# Patient Record
Sex: Female | Born: 1945 | Race: Black or African American | Hispanic: No | Marital: Married | Smoking: Former smoker
Health system: Southern US, Community
[De-identification: ages and names within clinical notes are randomized; demographics above are authoritative.]

## PROBLEM LIST (undated history)

## (undated) DIAGNOSIS — E119 Type 2 diabetes mellitus without complications: Secondary | ICD-10-CM

## (undated) DIAGNOSIS — J449 Chronic obstructive pulmonary disease, unspecified: Secondary | ICD-10-CM

## (undated) DIAGNOSIS — K219 Gastro-esophageal reflux disease without esophagitis: Secondary | ICD-10-CM

## (undated) DIAGNOSIS — R0602 Shortness of breath: Secondary | ICD-10-CM

## (undated) DIAGNOSIS — Z923 Personal history of irradiation: Secondary | ICD-10-CM

## (undated) DIAGNOSIS — E785 Hyperlipidemia, unspecified: Secondary | ICD-10-CM

## (undated) DIAGNOSIS — R042 Hemoptysis: Secondary | ICD-10-CM

## (undated) DIAGNOSIS — I10 Essential (primary) hypertension: Secondary | ICD-10-CM

## (undated) DIAGNOSIS — R05 Cough: Secondary | ICD-10-CM

## (undated) DIAGNOSIS — D72829 Elevated white blood cell count, unspecified: Secondary | ICD-10-CM

## (undated) DIAGNOSIS — C641 Malignant neoplasm of right kidney, except renal pelvis: Secondary | ICD-10-CM

## (undated) DIAGNOSIS — M1990 Unspecified osteoarthritis, unspecified site: Secondary | ICD-10-CM

## (undated) DIAGNOSIS — R55 Syncope and collapse: Secondary | ICD-10-CM

## (undated) DIAGNOSIS — Z7189 Other specified counseling: Secondary | ICD-10-CM

## (undated) DIAGNOSIS — M8580 Other specified disorders of bone density and structure, unspecified site: Secondary | ICD-10-CM

## (undated) DIAGNOSIS — C801 Malignant (primary) neoplasm, unspecified: Secondary | ICD-10-CM

## (undated) DIAGNOSIS — D649 Anemia, unspecified: Secondary | ICD-10-CM

## (undated) DIAGNOSIS — I499 Cardiac arrhythmia, unspecified: Secondary | ICD-10-CM

## (undated) DIAGNOSIS — C3490 Malignant neoplasm of unspecified part of unspecified bronchus or lung: Secondary | ICD-10-CM

## (undated) DIAGNOSIS — R5382 Chronic fatigue, unspecified: Secondary | ICD-10-CM

## (undated) DIAGNOSIS — N182 Chronic kidney disease, stage 2 (mild): Secondary | ICD-10-CM

## (undated) DIAGNOSIS — I82402 Acute embolism and thrombosis of unspecified deep veins of left lower extremity: Secondary | ICD-10-CM

## (undated) DIAGNOSIS — R251 Tremor, unspecified: Secondary | ICD-10-CM

## (undated) DIAGNOSIS — F419 Anxiety disorder, unspecified: Secondary | ICD-10-CM

## (undated) HISTORY — DX: Chronic fatigue, unspecified: R53.82

## (undated) HISTORY — DX: Chronic obstructive pulmonary disease, unspecified: J44.9

## (undated) HISTORY — DX: Type 2 diabetes mellitus without complications: E11.9

## (undated) HISTORY — DX: Hyperlipidemia, unspecified: E78.5

## (undated) HISTORY — DX: Anxiety disorder, unspecified: F41.9

## (undated) HISTORY — DX: Malignant neoplasm of unspecified part of unspecified bronchus or lung: C34.90

## (undated) HISTORY — DX: Chronic kidney disease, stage 2 (mild): N18.2

## (undated) HISTORY — DX: Tremor, unspecified: R25.1

## (undated) HISTORY — PX: EYE SURGERY: SHX253

## (undated) HISTORY — DX: Syncope and collapse: R55

## (undated) HISTORY — DX: Elevated white blood cell count, unspecified: D72.829

## (undated) HISTORY — DX: Other specified disorders of bone density and structure, unspecified site: M85.80

## (undated) HISTORY — DX: Acute embolism and thrombosis of unspecified deep veins of left lower extremity: I82.402

## (undated) HISTORY — PX: OTHER SURGICAL HISTORY: SHX169

## (undated) HISTORY — DX: Other specified counseling: Z71.89

## (undated) HISTORY — DX: Anemia, unspecified: D64.9

## (undated) HISTORY — DX: Malignant neoplasm of right kidney, except renal pelvis: C64.1

## (undated) HISTORY — PX: BACK SURGERY: SHX140

## (undated) HISTORY — DX: Gastro-esophageal reflux disease without esophagitis: K21.9

---

## 1998-02-02 ENCOUNTER — Other Ambulatory Visit: Admission: RE | Admit: 1998-02-02 | Discharge: 1998-02-02 | Payer: Self-pay | Admitting: Internal Medicine

## 1999-09-17 ENCOUNTER — Emergency Department (HOSPITAL_COMMUNITY): Admission: EM | Admit: 1999-09-17 | Discharge: 1999-09-17 | Payer: Self-pay | Admitting: Emergency Medicine

## 1999-09-23 ENCOUNTER — Encounter: Payer: Self-pay | Admitting: Internal Medicine

## 1999-09-23 ENCOUNTER — Encounter: Admission: RE | Admit: 1999-09-23 | Discharge: 1999-09-23 | Payer: Self-pay | Admitting: Internal Medicine

## 1999-10-05 ENCOUNTER — Encounter: Payer: Self-pay | Admitting: Vascular Surgery

## 1999-10-07 ENCOUNTER — Ambulatory Visit (HOSPITAL_COMMUNITY): Admission: RE | Admit: 1999-10-07 | Discharge: 1999-10-07 | Payer: Self-pay | Admitting: Vascular Surgery

## 1999-10-07 ENCOUNTER — Encounter: Payer: Self-pay | Admitting: Vascular Surgery

## 2000-08-28 ENCOUNTER — Other Ambulatory Visit: Admission: RE | Admit: 2000-08-28 | Discharge: 2000-08-28 | Payer: Self-pay | Admitting: Internal Medicine

## 2000-09-24 ENCOUNTER — Encounter: Admission: RE | Admit: 2000-09-24 | Discharge: 2000-09-24 | Payer: Self-pay | Admitting: Internal Medicine

## 2000-09-24 ENCOUNTER — Encounter: Payer: Self-pay | Admitting: Internal Medicine

## 2001-09-25 ENCOUNTER — Encounter: Payer: Self-pay | Admitting: Internal Medicine

## 2001-09-25 ENCOUNTER — Encounter: Admission: RE | Admit: 2001-09-25 | Discharge: 2001-09-25 | Payer: Self-pay | Admitting: Internal Medicine

## 2001-12-14 ENCOUNTER — Emergency Department (HOSPITAL_COMMUNITY): Admission: EM | Admit: 2001-12-14 | Discharge: 2001-12-14 | Payer: Self-pay | Admitting: Emergency Medicine

## 2002-07-28 ENCOUNTER — Other Ambulatory Visit: Admission: RE | Admit: 2002-07-28 | Discharge: 2002-07-28 | Payer: Self-pay

## 2002-09-29 ENCOUNTER — Encounter: Admission: RE | Admit: 2002-09-29 | Discharge: 2002-09-29 | Payer: Self-pay | Admitting: Internal Medicine

## 2002-09-29 ENCOUNTER — Encounter: Payer: Self-pay | Admitting: Internal Medicine

## 2003-02-10 ENCOUNTER — Other Ambulatory Visit: Admission: RE | Admit: 2003-02-10 | Discharge: 2003-02-10 | Payer: Self-pay | Admitting: Obstetrics and Gynecology

## 2003-03-24 ENCOUNTER — Encounter (INDEPENDENT_AMBULATORY_CARE_PROVIDER_SITE_OTHER): Payer: Self-pay | Admitting: Specialist

## 2003-03-24 ENCOUNTER — Ambulatory Visit (HOSPITAL_COMMUNITY): Admission: RE | Admit: 2003-03-24 | Discharge: 2003-03-24 | Payer: Self-pay | Admitting: Obstetrics and Gynecology

## 2003-10-06 ENCOUNTER — Encounter: Admission: RE | Admit: 2003-10-06 | Discharge: 2003-10-06 | Payer: Self-pay | Admitting: Internal Medicine

## 2004-05-12 ENCOUNTER — Inpatient Hospital Stay (HOSPITAL_COMMUNITY): Admission: EM | Admit: 2004-05-12 | Discharge: 2004-05-17 | Payer: Self-pay | Admitting: Emergency Medicine

## 2004-05-24 ENCOUNTER — Ambulatory Visit (HOSPITAL_COMMUNITY): Admission: RE | Admit: 2004-05-24 | Discharge: 2004-05-24 | Payer: Self-pay | Admitting: Gastroenterology

## 2004-07-04 ENCOUNTER — Ambulatory Visit (HOSPITAL_COMMUNITY): Admission: RE | Admit: 2004-07-04 | Discharge: 2004-07-04 | Payer: Self-pay | Admitting: Neonatology

## 2004-09-01 ENCOUNTER — Other Ambulatory Visit: Admission: RE | Admit: 2004-09-01 | Discharge: 2004-09-01 | Payer: Self-pay | Admitting: Obstetrics and Gynecology

## 2004-10-06 ENCOUNTER — Encounter: Admission: RE | Admit: 2004-10-06 | Discharge: 2004-10-06 | Payer: Self-pay | Admitting: Internal Medicine

## 2004-11-09 ENCOUNTER — Encounter: Admission: RE | Admit: 2004-11-09 | Discharge: 2004-11-09 | Payer: Self-pay | Admitting: Internal Medicine

## 2005-05-18 ENCOUNTER — Emergency Department (HOSPITAL_COMMUNITY): Admission: EM | Admit: 2005-05-18 | Discharge: 2005-05-18 | Payer: Self-pay | Admitting: Emergency Medicine

## 2005-06-07 ENCOUNTER — Inpatient Hospital Stay (HOSPITAL_COMMUNITY): Admission: EM | Admit: 2005-06-07 | Discharge: 2005-06-13 | Payer: Self-pay | Admitting: Emergency Medicine

## 2005-07-13 ENCOUNTER — Other Ambulatory Visit: Admission: RE | Admit: 2005-07-13 | Discharge: 2005-07-13 | Payer: Self-pay | Admitting: Internal Medicine

## 2005-10-18 ENCOUNTER — Encounter: Admission: RE | Admit: 2005-10-18 | Discharge: 2005-10-18 | Payer: Self-pay | Admitting: Internal Medicine

## 2005-12-26 ENCOUNTER — Other Ambulatory Visit: Admission: RE | Admit: 2005-12-26 | Discharge: 2005-12-26 | Payer: Self-pay | Admitting: Obstetrics and Gynecology

## 2006-05-29 ENCOUNTER — Encounter: Admission: RE | Admit: 2006-05-29 | Discharge: 2006-05-29 | Payer: Self-pay | Admitting: Internal Medicine

## 2006-07-04 ENCOUNTER — Ambulatory Visit (HOSPITAL_COMMUNITY): Admission: RE | Admit: 2006-07-04 | Discharge: 2006-07-04 | Payer: Self-pay | Admitting: Obstetrics and Gynecology

## 2006-07-04 ENCOUNTER — Encounter (INDEPENDENT_AMBULATORY_CARE_PROVIDER_SITE_OTHER): Payer: Self-pay | Admitting: *Deleted

## 2006-07-28 ENCOUNTER — Emergency Department (HOSPITAL_COMMUNITY): Admission: EM | Admit: 2006-07-28 | Discharge: 2006-07-28 | Payer: Self-pay | Admitting: Emergency Medicine

## 2006-09-18 ENCOUNTER — Inpatient Hospital Stay (HOSPITAL_COMMUNITY): Admission: EM | Admit: 2006-09-18 | Discharge: 2006-09-20 | Payer: Self-pay | Admitting: Emergency Medicine

## 2006-10-23 ENCOUNTER — Encounter: Admission: RE | Admit: 2006-10-23 | Discharge: 2006-10-23 | Payer: Self-pay | Admitting: Internal Medicine

## 2007-01-06 ENCOUNTER — Emergency Department (HOSPITAL_COMMUNITY): Admission: EM | Admit: 2007-01-06 | Discharge: 2007-01-06 | Payer: Self-pay | Admitting: *Deleted

## 2007-01-10 ENCOUNTER — Other Ambulatory Visit: Admission: RE | Admit: 2007-01-10 | Discharge: 2007-01-10 | Payer: Self-pay | Admitting: Obstetrics and Gynecology

## 2007-02-12 ENCOUNTER — Ambulatory Visit (HOSPITAL_COMMUNITY): Admission: RE | Admit: 2007-02-12 | Discharge: 2007-02-12 | Payer: Self-pay | Admitting: Ophthalmology

## 2007-03-04 ENCOUNTER — Encounter: Admission: RE | Admit: 2007-03-04 | Discharge: 2007-03-04 | Payer: Self-pay | Admitting: Obstetrics and Gynecology

## 2007-04-15 ENCOUNTER — Inpatient Hospital Stay (HOSPITAL_COMMUNITY): Admission: EM | Admit: 2007-04-15 | Discharge: 2007-04-18 | Payer: Self-pay | Admitting: Emergency Medicine

## 2007-05-18 ENCOUNTER — Emergency Department (HOSPITAL_COMMUNITY): Admission: EM | Admit: 2007-05-18 | Discharge: 2007-05-18 | Payer: Self-pay | Admitting: Emergency Medicine

## 2007-07-26 ENCOUNTER — Emergency Department (HOSPITAL_COMMUNITY): Admission: EM | Admit: 2007-07-26 | Discharge: 2007-07-26 | Payer: Self-pay | Admitting: Family Medicine

## 2007-08-07 ENCOUNTER — Ambulatory Visit (HOSPITAL_COMMUNITY): Admission: RE | Admit: 2007-08-07 | Discharge: 2007-08-07 | Payer: Self-pay | Admitting: Ophthalmology

## 2007-09-12 ENCOUNTER — Ambulatory Visit (HOSPITAL_COMMUNITY): Admission: RE | Admit: 2007-09-12 | Discharge: 2007-09-12 | Payer: Self-pay | Admitting: Ophthalmology

## 2007-10-24 ENCOUNTER — Encounter: Admission: RE | Admit: 2007-10-24 | Discharge: 2007-10-24 | Payer: Self-pay | Admitting: Obstetrics and Gynecology

## 2007-11-01 ENCOUNTER — Encounter: Admission: RE | Admit: 2007-11-01 | Discharge: 2007-11-01 | Payer: Self-pay | Admitting: Obstetrics and Gynecology

## 2007-11-04 ENCOUNTER — Ambulatory Visit (HOSPITAL_COMMUNITY): Admission: RE | Admit: 2007-11-04 | Discharge: 2007-11-04 | Payer: Self-pay | Admitting: Ophthalmology

## 2008-08-13 ENCOUNTER — Observation Stay (HOSPITAL_COMMUNITY): Admission: EM | Admit: 2008-08-13 | Discharge: 2008-08-14 | Payer: Self-pay | Admitting: Emergency Medicine

## 2008-10-04 ENCOUNTER — Emergency Department (HOSPITAL_COMMUNITY): Admission: EM | Admit: 2008-10-04 | Discharge: 2008-10-04 | Payer: Self-pay | Admitting: Emergency Medicine

## 2008-10-27 ENCOUNTER — Encounter: Admission: RE | Admit: 2008-10-27 | Discharge: 2008-10-27 | Payer: Self-pay | Admitting: Internal Medicine

## 2008-11-11 ENCOUNTER — Encounter: Admission: RE | Admit: 2008-11-11 | Discharge: 2008-11-11 | Payer: Self-pay | Admitting: Obstetrics and Gynecology

## 2008-11-13 ENCOUNTER — Ambulatory Visit (HOSPITAL_COMMUNITY): Admission: RE | Admit: 2008-11-13 | Discharge: 2008-11-13 | Payer: Self-pay | Admitting: Urology

## 2008-11-17 ENCOUNTER — Emergency Department (HOSPITAL_COMMUNITY): Admission: EM | Admit: 2008-11-17 | Discharge: 2008-11-18 | Payer: Self-pay | Admitting: Emergency Medicine

## 2008-12-01 ENCOUNTER — Ambulatory Visit: Payer: Self-pay | Admitting: Surgery

## 2008-12-01 ENCOUNTER — Inpatient Hospital Stay (HOSPITAL_COMMUNITY): Admission: RE | Admit: 2008-12-01 | Discharge: 2008-12-06 | Payer: Self-pay | Admitting: Urology

## 2008-12-01 ENCOUNTER — Encounter: Payer: Self-pay | Admitting: Urology

## 2009-04-14 ENCOUNTER — Inpatient Hospital Stay (HOSPITAL_COMMUNITY): Admission: RE | Admit: 2009-04-14 | Discharge: 2009-04-15 | Payer: Self-pay | Admitting: Orthopaedic Surgery

## 2009-05-11 ENCOUNTER — Ambulatory Visit (HOSPITAL_COMMUNITY): Admission: RE | Admit: 2009-05-11 | Discharge: 2009-05-11 | Payer: Self-pay | Admitting: Urology

## 2009-05-31 ENCOUNTER — Emergency Department (HOSPITAL_COMMUNITY): Admission: EM | Admit: 2009-05-31 | Discharge: 2009-05-31 | Payer: Self-pay | Admitting: Family Medicine

## 2009-06-08 ENCOUNTER — Ambulatory Visit: Payer: Self-pay | Admitting: Vascular Surgery

## 2009-06-08 ENCOUNTER — Inpatient Hospital Stay (HOSPITAL_COMMUNITY): Admission: RE | Admit: 2009-06-08 | Discharge: 2009-06-14 | Payer: Self-pay | Admitting: Orthopaedic Surgery

## 2009-06-08 ENCOUNTER — Encounter (INDEPENDENT_AMBULATORY_CARE_PROVIDER_SITE_OTHER): Payer: Self-pay | Admitting: Orthopaedic Surgery

## 2009-06-15 ENCOUNTER — Emergency Department (HOSPITAL_COMMUNITY): Admission: EM | Admit: 2009-06-15 | Discharge: 2009-06-16 | Payer: Self-pay | Admitting: Emergency Medicine

## 2009-06-30 ENCOUNTER — Encounter
Admission: RE | Admit: 2009-06-30 | Discharge: 2009-06-30 | Payer: Self-pay | Admitting: Physical Medicine and Rehabilitation

## 2009-07-08 ENCOUNTER — Ambulatory Visit (HOSPITAL_COMMUNITY)
Admission: RE | Admit: 2009-07-08 | Discharge: 2009-07-08 | Payer: Self-pay | Admitting: Physical Medicine and Rehabilitation

## 2009-07-16 ENCOUNTER — Encounter
Admission: RE | Admit: 2009-07-16 | Discharge: 2009-07-16 | Payer: Self-pay | Admitting: Physical Medicine and Rehabilitation

## 2009-08-19 ENCOUNTER — Ambulatory Visit (HOSPITAL_COMMUNITY): Admission: RE | Admit: 2009-08-19 | Discharge: 2009-08-19 | Payer: Self-pay | Admitting: Gastroenterology

## 2009-11-09 ENCOUNTER — Ambulatory Visit (HOSPITAL_COMMUNITY): Admission: RE | Admit: 2009-11-09 | Discharge: 2009-11-09 | Payer: Self-pay | Admitting: Urology

## 2009-11-12 ENCOUNTER — Encounter: Admission: RE | Admit: 2009-11-12 | Discharge: 2009-11-12 | Payer: Self-pay | Admitting: Obstetrics and Gynecology

## 2010-01-16 ENCOUNTER — Emergency Department (HOSPITAL_COMMUNITY): Admission: EM | Admit: 2010-01-16 | Discharge: 2010-01-16 | Payer: Self-pay | Admitting: Emergency Medicine

## 2010-01-27 ENCOUNTER — Other Ambulatory Visit: Admission: RE | Admit: 2010-01-27 | Discharge: 2010-01-27 | Payer: Self-pay | Admitting: Obstetrics and Gynecology

## 2010-04-11 ENCOUNTER — Encounter: Payer: Self-pay | Admitting: Obstetrics and Gynecology

## 2010-06-01 LAB — PROTIME-INR
INR: 2.49 — ABNORMAL HIGH (ref 0.00–1.49)
Prothrombin Time: 27 seconds — ABNORMAL HIGH (ref 11.6–15.2)

## 2010-06-05 LAB — DIFFERENTIAL
Basophils Relative: 1 % (ref 0–1)
Lymphs Abs: 2.7 10*3/uL (ref 0.7–4.0)
Monocytes Absolute: 0.4 10*3/uL (ref 0.1–1.0)
Monocytes Relative: 4 % (ref 3–12)
Neutro Abs: 7.6 10*3/uL (ref 1.7–7.7)

## 2010-06-05 LAB — BASIC METABOLIC PANEL
CO2: 32 mEq/L (ref 19–32)
Chloride: 102 mEq/L (ref 96–112)
Creatinine, Ser: 1.32 mg/dL — ABNORMAL HIGH (ref 0.4–1.2)
GFR calc Af Amer: 50 mL/min — ABNORMAL LOW (ref >60)
Potassium: 4.2 mEq/L (ref 3.5–5.1)
Sodium: 140 mEq/L (ref 135–145)

## 2010-06-05 LAB — CBC
HCT: 32.8 % — ABNORMAL LOW (ref 36.0–46.0)
Platelets: 263 10*3/uL (ref 150–400)
RDW: 15 % (ref 11.5–15.5)

## 2010-06-05 LAB — COMPREHENSIVE METABOLIC PANEL
Albumin: 3.8 g/dL (ref 3.5–5.2)
Alkaline Phosphatase: 61 U/L (ref 39–117)
BUN: 10 mg/dL (ref 6–23)
Potassium: 4.3 mEq/L (ref 3.5–5.1)
Sodium: 140 mEq/L (ref 135–145)
Total Protein: 6.5 g/dL (ref 6.0–8.3)

## 2010-06-06 ENCOUNTER — Ambulatory Visit (HOSPITAL_COMMUNITY)
Admission: RE | Admit: 2010-06-06 | Discharge: 2010-06-06 | Disposition: A | Discharge status: Home / Self Care | Payer: Medicare Other | Source: Ambulatory Visit | Attending: Urology | Admitting: Urology

## 2010-06-06 ENCOUNTER — Other Ambulatory Visit: Payer: Self-pay | Admitting: Urology

## 2010-06-06 DIAGNOSIS — C649 Malignant neoplasm of unspecified kidney, except renal pelvis: Secondary | ICD-10-CM | POA: Insufficient documentation

## 2010-06-06 DIAGNOSIS — J438 Other emphysema: Secondary | ICD-10-CM | POA: Insufficient documentation

## 2010-06-12 LAB — BASIC METABOLIC PANEL
CO2: 31 mEq/L (ref 19–32)
Chloride: 99 mEq/L (ref 96–112)
Creatinine, Ser: 1.17 mg/dL (ref 0.4–1.2)
GFR calc Af Amer: 57 mL/min — ABNORMAL LOW (ref >60)
Potassium: 4 mEq/L (ref 3.5–5.1)
Sodium: 139 mEq/L (ref 135–145)

## 2010-06-12 LAB — PROTIME-INR
INR: 0.98 (ref 0.00–1.49)
INR: 1.09 (ref 0.00–1.49)
INR: 1.31 (ref 0.00–1.49)
Prothrombin Time: 14.7 seconds (ref 11.6–15.2)
Prothrombin Time: 16.2 seconds — ABNORMAL HIGH (ref 11.6–15.2)
Prothrombin Time: 21.3 seconds — ABNORMAL HIGH (ref 11.6–15.2)

## 2010-06-12 LAB — DIFFERENTIAL
Basophils Relative: 0 % (ref 0–1)
Eosinophils Absolute: 0 10*3/uL (ref 0.0–0.7)
Eosinophils Relative: 0 % (ref 0–5)
Lymphs Abs: 2.6 10*3/uL (ref 0.7–4.0)
Monocytes Absolute: 0.4 10*3/uL (ref 0.1–1.0)
Monocytes Relative: 4 % (ref 3–12)

## 2010-06-12 LAB — CBC
HCT: 31.2 % — ABNORMAL LOW (ref 36.0–46.0)
HCT: 33.1 % — ABNORMAL LOW (ref 36.0–46.0)
HCT: 35.1 % — ABNORMAL LOW (ref 36.0–46.0)
Hemoglobin: 10.9 g/dL — ABNORMAL LOW (ref 12.0–15.0)
Hemoglobin: 10.9 g/dL — ABNORMAL LOW (ref 12.0–15.0)
MCHC: 33 g/dL (ref 30.0–36.0)
MCHC: 33 g/dL (ref 30.0–36.0)
MCHC: 33.5 g/dL (ref 30.0–36.0)
MCV: 99.4 fL (ref 78.0–100.0)
MCV: 99.6 fL (ref 78.0–100.0)
Platelets: 196 10*3/uL (ref 150–400)
Platelets: 208 10*3/uL (ref 150–400)
RBC: 3.3 MIL/uL — ABNORMAL LOW (ref 3.87–5.11)
RBC: 3.33 MIL/uL — ABNORMAL LOW (ref 3.87–5.11)
RDW: 14.7 % (ref 11.5–15.5)
RDW: 15 % (ref 11.5–15.5)
RDW: 15.1 % (ref 11.5–15.5)

## 2010-06-12 LAB — COMPREHENSIVE METABOLIC PANEL
Albumin: 3.8 g/dL (ref 3.5–5.2)
BUN: 22 mg/dL (ref 6–23)
Creatinine, Ser: 1.29 mg/dL — ABNORMAL HIGH (ref 0.4–1.2)
Total Protein: 7.1 g/dL (ref 6.0–8.3)

## 2010-06-12 LAB — APTT: aPTT: 25 seconds (ref 24–37)

## 2010-06-15 ENCOUNTER — Encounter: Payer: Self-pay | Admitting: Oncology

## 2010-06-21 ENCOUNTER — Other Ambulatory Visit: Payer: Self-pay | Admitting: Oncology

## 2010-06-21 ENCOUNTER — Encounter (HOSPITAL_BASED_OUTPATIENT_CLINIC_OR_DEPARTMENT_OTHER): Payer: PRIVATE HEALTH INSURANCE | Admitting: Oncology

## 2010-06-21 DIAGNOSIS — D72829 Elevated white blood cell count, unspecified: Secondary | ICD-10-CM

## 2010-06-21 DIAGNOSIS — E119 Type 2 diabetes mellitus without complications: Secondary | ICD-10-CM

## 2010-06-21 DIAGNOSIS — E785 Hyperlipidemia, unspecified: Secondary | ICD-10-CM

## 2010-06-21 DIAGNOSIS — J449 Chronic obstructive pulmonary disease, unspecified: Secondary | ICD-10-CM

## 2010-06-21 LAB — CBC WITH DIFFERENTIAL/PLATELET
BASO%: 0.2 % (ref 0.0–2.0)
Basophils Absolute: 0 10*3/uL (ref 0.0–0.1)
EOS%: 0.5 % (ref 0.0–7.0)
HCT: 34.7 % — ABNORMAL LOW (ref 34.8–46.6)
HGB: 11.2 g/dL — ABNORMAL LOW (ref 11.6–15.9)
LYMPH%: 23.1 % (ref 14.0–49.7)
MCH: 30.9 pg (ref 25.1–34.0)
MCHC: 32.3 g/dL (ref 31.5–36.0)
NEUT%: 72.4 % (ref 38.4–76.8)
Platelets: 308 10*3/uL (ref 145–400)

## 2010-06-21 LAB — COMPREHENSIVE METABOLIC PANEL
Albumin: 4.2 g/dL (ref 3.5–5.2)
Alkaline Phosphatase: 75 U/L (ref 39–117)
BUN: 18 mg/dL (ref 6–23)
CO2: 24 mEq/L (ref 19–32)
Glucose, Bld: 127 mg/dL — ABNORMAL HIGH (ref 70–99)
Potassium: 3.8 mEq/L (ref 3.5–5.3)
Sodium: 142 mEq/L (ref 135–145)
Total Bilirubin: 0.3 mg/dL (ref 0.3–1.2)
Total Protein: 6.6 g/dL (ref 6.0–8.3)

## 2010-06-24 LAB — BASIC METABOLIC PANEL
BUN: 13 mg/dL (ref 6–23)
BUN: 19 mg/dL (ref 6–23)
BUN: 9 mg/dL (ref 6–23)
CO2: 26 mEq/L (ref 19–32)
CO2: 29 mEq/L (ref 19–32)
CO2: 27 mEq/L (ref 19–32)
CO2: 28 mEq/L (ref 19–32)
Calcium: 7.4 mg/dL — ABNORMAL LOW (ref 8.4–10.5)
Calcium: 9 mg/dL (ref 8.4–10.5)
Calcium: 9.1 mg/dL (ref 8.4–10.5)
Chloride: 99 mEq/L (ref 96–112)
Chloride: 101 mEq/L (ref 96–112)
Chloride: 101 mEq/L (ref 96–112)
Creatinine, Ser: 1.41 mg/dL — ABNORMAL HIGH (ref 0.4–1.2)
Creatinine, Ser: 1.55 mg/dL — ABNORMAL HIGH (ref 0.4–1.2)
Creatinine, Ser: 1.46 mg/dL — ABNORMAL HIGH (ref 0.4–1.2)
GFR calc Af Amer: 41 mL/min — ABNORMAL LOW (ref >60)
GFR calc Af Amer: 46 mL/min — ABNORMAL LOW (ref >60)
GFR calc Af Amer: 44 mL/min — ABNORMAL LOW (ref >60)
GFR calc Af Amer: >60 mL/min (ref >60)
GFR calc non Af Amer: 38 mL/min — ABNORMAL LOW (ref >60)
GFR calc non Af Amer: 36 mL/min — ABNORMAL LOW (ref >60)
Glucose, Bld: 126 mg/dL — ABNORMAL HIGH (ref 70–99)
Glucose, Bld: 124 mg/dL — ABNORMAL HIGH (ref 70–99)
Potassium: 4.2 mEq/L (ref 3.5–5.1)
Potassium: 4.7 mEq/L (ref 3.5–5.1)
Sodium: 129 mEq/L — ABNORMAL LOW (ref 135–145)
Sodium: 139 mEq/L (ref 135–145)
Sodium: 140 mEq/L (ref 135–145)

## 2010-06-24 LAB — CBC
HCT: 31.8 % — ABNORMAL LOW (ref 36.0–46.0)
HCT: 28.6 % — ABNORMAL LOW (ref 36.0–46.0)
HCT: 37.5 % (ref 36.0–46.0)
Hemoglobin: 10.8 g/dL — ABNORMAL LOW (ref 12.0–15.0)
Hemoglobin: 8 g/dL — ABNORMAL LOW (ref 12.0–15.0)
Hemoglobin: 11.7 g/dL — ABNORMAL LOW (ref 12.0–15.0)
Hemoglobin: 12.4 g/dL (ref 12.0–15.0)
MCHC: 33.9 g/dL (ref 30.0–36.0)
MCHC: 33 g/dL (ref 30.0–36.0)
MCHC: 33 g/dL (ref 30.0–36.0)
MCV: 96.1 fL (ref 78.0–100.0)
MCV: 100.4 fL — ABNORMAL HIGH (ref 78.0–100.0)
MCV: 99 fL (ref 78.0–100.0)
Platelets: 180 10*3/uL (ref 150–400)
RBC: 2.38 MIL/uL — ABNORMAL LOW (ref 3.87–5.11)
RBC: 3.31 MIL/uL — ABNORMAL LOW (ref 3.87–5.11)
RBC: 2.87 MIL/uL — ABNORMAL LOW (ref 3.87–5.11)
RBC: 3.53 MIL/uL — ABNORMAL LOW (ref 3.87–5.11)
RBC: 3.79 MIL/uL — ABNORMAL LOW (ref 3.87–5.11)
WBC: 11.6 10*3/uL — ABNORMAL HIGH (ref 4.0–10.5)
WBC: 12.3 10*3/uL — ABNORMAL HIGH (ref 4.0–10.5)
WBC: 13.8 10*3/uL — ABNORMAL HIGH (ref 4.0–10.5)
WBC: 14.5 10*3/uL — ABNORMAL HIGH (ref 4.0–10.5)
WBC: 15.6 10*3/uL — ABNORMAL HIGH (ref 4.0–10.5)

## 2010-06-24 LAB — CROSSMATCH: ABO/RH(D): O POS

## 2010-06-24 LAB — HEPATIC FUNCTION PANEL
AST: 23 U/L (ref 0–37)
Albumin: 3.5 g/dL (ref 3.5–5.2)
Alkaline Phosphatase: 72 U/L (ref 39–117)
Bilirubin, Direct: <0.1 mg/dL (ref 0.0–0.3)
Total Bilirubin: 0.7 mg/dL (ref 0.3–1.2)

## 2010-06-24 LAB — DIFFERENTIAL
Basophils Relative: 1 % (ref 0–1)
Lymphs Abs: 2.3 10*3/uL (ref 0.7–4.0)
Monocytes Absolute: 0.6 10*3/uL (ref 0.1–1.0)
Monocytes Relative: 4 % (ref 3–12)
Neutro Abs: 12.6 10*3/uL — ABNORMAL HIGH (ref 1.7–7.7)
Neutrophils Relative %: 81 % — ABNORMAL HIGH (ref 43–77)

## 2010-06-24 LAB — MRSA PCR SCREENING: MRSA by PCR: POSITIVE — AB

## 2010-06-24 LAB — BCR/ABL (LIO MMD)

## 2010-06-24 LAB — POCT CARDIAC MARKERS: Troponin i, poc: <0.05 ng/mL (ref 0.00–0.09)

## 2010-06-24 LAB — GLUCOSE, CAPILLARY: Glucose-Capillary: 161 mg/dL — ABNORMAL HIGH (ref 70–99)

## 2010-06-26 LAB — POCT I-STAT, CHEM 8
BUN: 13 mg/dL (ref 6–23)
Calcium, Ion: 1.18 mmol/L (ref 1.12–1.32)
Chloride: 103 mEq/L (ref 96–112)
Creatinine, Ser: 1.1 mg/dL (ref 0.4–1.2)
Glucose, Bld: 122 mg/dL — ABNORMAL HIGH (ref 70–99)

## 2010-06-26 LAB — DIFFERENTIAL
Eosinophils Relative: 1 % (ref 0–5)
Lymphocytes Relative: 25 % (ref 12–46)
Monocytes Absolute: 0.5 10*3/uL (ref 0.1–1.0)
Monocytes Relative: 4 % (ref 3–12)
Neutro Abs: 8.5 10*3/uL — ABNORMAL HIGH (ref 1.7–7.7)

## 2010-06-26 LAB — CBC
HCT: 38.3 % (ref 36.0–46.0)
Hemoglobin: 12.6 g/dL (ref 12.0–15.0)
MCV: 97.6 fL (ref 78.0–100.0)
WBC: 12.4 10*3/uL — ABNORMAL HIGH (ref 4.0–10.5)

## 2010-06-28 LAB — DIFFERENTIAL
Basophils Absolute: 0 10*3/uL (ref 0.0–0.1)
Eosinophils Relative: 1 % (ref 0–5)
Lymphocytes Relative: 32 % (ref 12–46)
Lymphs Abs: 3.9 10*3/uL (ref 0.7–4.0)
Monocytes Absolute: 0.7 10*3/uL (ref 0.1–1.0)
Neutro Abs: 7.4 10*3/uL (ref 1.7–7.7)

## 2010-06-28 LAB — LIPID PANEL
HDL: 51 mg/dL (ref >39)
LDL Cholesterol: 157 mg/dL — ABNORMAL HIGH (ref 0–99)
Total CHOL/HDL Ratio: 4.6 RATIO
Triglycerides: 137 mg/dL (ref <150)
VLDL: 27 mg/dL (ref 0–40)

## 2010-06-28 LAB — POCT I-STAT, CHEM 8
BUN: 17 mg/dL (ref 6–23)
Calcium, Ion: 1.15 mmol/L (ref 1.12–1.32)
Creatinine, Ser: 1.2 mg/dL (ref 0.4–1.2)
Glucose, Bld: 101 mg/dL — ABNORMAL HIGH (ref 70–99)
TCO2: 29 mmol/L (ref 0–100)

## 2010-06-28 LAB — CARDIAC PANEL(CRET KIN+CKTOT+MB+TROPI)
Relative Index: 3 — ABNORMAL HIGH (ref 0.0–2.5)
Relative Index: 3.1 — ABNORMAL HIGH (ref 0.0–2.5)
Total CK: 180 U/L — ABNORMAL HIGH (ref 7–177)
Total CK: 187 U/L — ABNORMAL HIGH (ref 7–177)
Troponin I: 0.02 ng/mL (ref 0.00–0.06)

## 2010-06-28 LAB — TROPONIN I: Troponin I: 0.03 ng/mL (ref 0.00–0.06)

## 2010-06-28 LAB — POCT CARDIAC MARKERS
CKMB, poc: 2.6 ng/mL (ref 1.0–8.0)
Troponin i, poc: <0.05 ng/mL (ref 0.00–0.09)

## 2010-06-28 LAB — CK TOTAL AND CKMB (NOT AT ARMC)
Relative Index: 2.5 (ref 0.0–2.5)
Total CK: 172 U/L (ref 7–177)

## 2010-06-28 LAB — BASIC METABOLIC PANEL
CO2: 31 mEq/L (ref 19–32)
Calcium: 9.3 mg/dL (ref 8.4–10.5)
GFR calc Af Amer: >60 mL/min (ref >60)
Sodium: 139 mEq/L (ref 135–145)

## 2010-06-28 LAB — CBC
Hemoglobin: 11.3 g/dL — ABNORMAL LOW (ref 12.0–15.0)
MCHC: 33.7 g/dL (ref 30.0–36.0)
MCV: 98.6 fL (ref 78.0–100.0)
RBC: 3.42 MIL/uL — ABNORMAL LOW (ref 3.87–5.11)
RBC: 3.52 MIL/uL — ABNORMAL LOW (ref 3.87–5.11)
WBC: 11.7 10*3/uL — ABNORMAL HIGH (ref 4.0–10.5)

## 2010-08-02 NOTE — Discharge Summary (Signed)
NAME:  Melanie, Best NO.:  [id number]   MEDICAL RECORD NO.:  [id number]          PATIENT TYPE:  INP   LOCATION:  6711                         FACILITY:  [facility]   PHYSICIAN:  Georgann Housekeeper, MD      DATE OF BIRTH:  [birth year]   DATE OF ADMISSION:  04/15/2007  DATE OF DISCHARGE:  04/18/2007                               DISCHARGE SUMMARY   DISCHARGE DIAGNOSES:  1. Chronic obstructive pulmonary disease exacerbation.  2. Bronchitis.  3. Gastroesophageal reflux disease.   DISCHARGE MEDICATIONS:  1. DuoNeb every 4 hours as needed.  2. Prevacid 30 mg daily.  3. Spiriva inhaler once a day.  4. Advair 250/50 one inhalation b.i.d.  5. Estrogen tablet 1 a day.  6. Progesterone 2.5 mg daily.  7. Travatan eye drops at bedtime.  8. Prednisone 60 mg for 3 days, 40 mg for 3 days, 20 mg for 3 days,      and then 10 mg daily.  9. Azithromycin 250 mg for a total of 3 more days.  10.Combivent MDI 2 puffs q.4h. p.r.n.  11.Oxygen 2 liters nasal cannula.  12.MiraLax 17 grams daily.   FOLLOW UP:  He is to follow up in 1 week the office.   LABORATORY DATA:  White count 19.9 down from 23,000, hemoglobin 11.8.  Potassium 4.3, creatinine 1.0, glucose 139.   PHYSICAL EXAMINATION:  VITAL SIGNS:  Blood pressure 123/85, pulse 90,  respirations 20, temperature 98.1, 100% saturations on 2 liters.   HOSPITAL COURSE:  A 65 year old with severe COPD, O2 dependent, on  prednisone who came in with COPD exacerbation with bronchitis.  There  was no evidence of any pneumonia on the chest x-ray.  White count was  elevated initially.   Problem 1.  COPD exacerbation.  The patient was started on IV Solu-  Medrol every 6 hours along with nebulizers and continued on Advair and  Spiriva.  She was also started on azithromycin.  Her white count  initially rose because of the IV steroids and then subsequently started  to come down to 19,000.  The patient remained afebrile.  She was  continued on  antibiotics.  She slowly improved with the wheezing.  She  was still a little dyspneic with minimal exertion.  She was continued on  oxygen and the nebulizer, with a taper of the steroids.   Problem 2.  Acid reflux disease.  Continue on Protonix in the hospital.  She was on Prevacid at home.   Problem 3.  The patient was on estrogen and progesterone which was held  in the hospital.  She started having some periods.  She was restarted  back on her estrogen and progesterone.  If there are anymore problems,  we will continue the GYN.   On her followup visit in the office in 1 week, we will recheck her CBC.  Most likely it is steroid-related leukocytosis.      Georgann Housekeeper, MD  Electronically Signed     KH/MEDQ  D:  04/18/2007  T:  04/18/2007  Job:  [id number]

## 2010-08-02 NOTE — H&P (Signed)
NAME:  Melanie Best, MARQUESS NO.:  [id number]   MEDICAL RECORD NO.:  [id number]          PATIENT TYPE:  INP   LOCATION:  6733                         FACILITY:  [facility]   PHYSICIAN:  Georgann Housekeeper, MD      DATE OF BIRTH:  22-Jan-1946   DATE OF ADMISSION:  04/15/2007  DATE OF DISCHARGE:                              HISTORY & PHYSICAL   CHIEF COMPLAINT:  Shortness of breath.   HPI:  The patient is a 65 year old African-American female with a  history of COPD, GERD and history of clotting in abdominal vessel who  presented with a complaint of worsening cough and wheezing for 7 days,  reported started as a cough productive of scant clear and brown-looking  sputum.  A couple days ago she started having symptoms of cold and runny  nose, sneezing as well as more congestion and wheezing.  She increased  her prednisone from 5 mg p.o. daily to 60 mg p.o. daily as discussed  with her physician and she took that for two days without any relief.  She presented to the ER now for further care.  Otherwise, she denies any  fever, chills, nausea, vomiting, diarrhea.  She reports difficulty  laying down because of increased wheezing. Otherwise, she denies any  chest pain.  No headache.  The patient does not smoke, she quit smoking  in 2006.  However, she uses oxygen at night at home.   PAST HISTORY:  1. Asthma, COPD, never has been intubated, uses oxygen at night.  2. GERD.  3. History of clot in abdominal vessel.   FAMILY HISTORY:  Noncontributory.   SOCIAL HISTORY:  1. Quit tobacco in 2006.  2. Otherwise, occasional alcohol.  3. No IV drug use.   DRUG ALLERGIES:  No known drug allergies.   MEDICATIONS:  1. DuoNeb.  2. Combivent inhaler.  3. Prevacid 30 mg once a day.  4. Prednisone 5 mg once a day.  5. Prempro double strength once a day.  6. Medroxyprogesterone 2.5 mg once a day.  7. Spiriva 18 mcg, a puff b.i.d.   PHYSICAL EXAMINATION:  Temperature 99.1, pulse 133,  respiratory rate 16,  blood pressure 143/89.  GENERAL:  No acute distress.  She has a fine resting tremor.  HEENT:  PERRLA, EOMI.  NECK:  No lymphadenopathy, no thyromegaly, no JVD.  CHEST:  Diffuse end-expiratory wheezes.  She also had decreased air  movement.  HEART:  Regular rate and rhythm.  No murmur, rubs or gallops,  tachycardic.  ABDOMEN:  Soft, nontender, nondistended, normoactive bowel sounds.  EXTREMITY:  No clubbing, cyanosis or edema.   LABS:  Sodium 139, potassium 3.6, BUN 10, creatinine 1, glucose 176,  white count 17.8, H and H 12.6 and 38, platelets 344,000, ABG 7.418,  pCO2 47.  Chest x-ray showed hyperinflation.   IMPRESSION:  A 65 year old African-American female with chronic  obstructive pulmonary disease exacerbation.  1. Chronic obstructive pulmonary disease exacerbation, likely induced      by viral bronchitis.  Will put patient on steroids, Solu-Medrol 60      mg IV every  6 hours, given her albuterol and Atrovent nebulizer      every 3 hours as scheduled and start patient on azithromycin for      atypical coverage as well as place patient on oxygen.  Patient will      continue Spiriva as well as we will add Advair Discus to the      medication regimen.  2. Gastroesophageal reflux disease,  We will continue patient on      Prilosec.  3. History of clotting in her abdominal vessel.  Patient is noted to      be on hormone replacement therapy which is clearly indicated in      deep vein thrombosis.  Although she had a clot in 1990 and started      hormone replacement therapy in 2007, I would favor discontinuation      of this therapy.  Will hold hormone replacement therapy during this      hospitalization and patient will discuss this with her primary care      physician.  4. Leukocytosis, likely due to prednisone use over the last two days.      Will continue to follow.      Sabino Donovan, MD   Electronically Signed      ______________________________  Georgann Housekeeper, MD    MJ/MEDQ  D:  04/15/2007  T:  04/15/2007  Job:  [id number]

## 2010-08-02 NOTE — H&P (Signed)
NAME:  Melanie Best, Melanie Best               ACCOUNT NO.:  [id number]   MEDICAL RECORD NO.:  [id number]          PATIENT TYPE:  OBV   LOCATION:  3703                         FACILITY:  [facility]   PHYSICIAN:  Kela Millin, M.D.DATE OF BIRTH:  [birth year]   DATE OF ADMISSION:  08/13/2008  DATE OF DISCHARGE:  08/14/2008                              HISTORY & PHYSICAL   CONTINUATION   REVIEW OF SYSTEMS:  As per HPI.  Other review of systems negative.   PHYSICAL EXAMINATION:  GENERAL:  The patient is an older black female in  no respiratory distress.  VITAL SIGNS:  Her temperature is 97.6 with a blood pressure of 116/70,  pulse of 78, respiratory rate of 14, O2 saturation 100% on 2 liters  nasal cannula.  HEENT:  PERRL, EOMI, sclerae anicteric.  Moist mucous membranes.  No  oral exudates.  NECK:  Supple, no adenopathy, no thyromegaly and no JVD.  LUNGS:  Moderate air movement, no wheezes and no crackles.  CARDIOVASCULAR:  Regular rate and rhythm.  Normal S1-S2.  No chest wall  tenderness.  ABDOMEN:  Soft, bowel sounds present, nontender, nondistended.  No  organomegaly and no masses palpable.  No epigastric tenderness.  EXTREMITIES:  No cyanosis or edema.   LABORATORY DATA:  As per HPI.  Also, sodium is 143, potassium 4,  chloride 104, BUN of 17, creatinine 1.2, glucose 101.  Point of care  markers negative x1.  Her white cell count is 12.1 with a hemoglobin of  11.5, hematocrit 34.7, platelet count 299.   ASSESSMENT AND PLAN:  1. Chest pain - as discussed above, we will cycle cardiac enzymes to      evaluate for MI.  Also place the patient on aspirin and p.r.n.      nitroglycerin.  As noted above, she has a history of      gastroesophageal reflux disease, will cover with PPI.  Will follow      cardiac enzymes and consult cardiology pending enzymes.  2. Chronic obstructive pulmonary disease - clinically stable.  Will      continue outpatient medications.  3. Low back pain - as above, was  lifting furniture the week before      onset of her symptoms.  Likely musculoskeletal strain, will check x-      rays of back, pain management and follow.  4. Gastroesophageal reflux disease - place on PPI.  5. Chronic steroid use - will continue prednisone and follow.      Kela Millin, M.D.  Electronically Signed     ACV/MEDQ  D:  08/14/2008  T:  08/14/2008  Job:  [id number]   cc:   Georgann Housekeeper, MD

## 2010-08-02 NOTE — H&P (Signed)
NAME:  Melanie Best, Melanie Best.:  [id number]   MEDICAL RECORD Best.:  [id number]          PATIENT TYPE:  INP   LOCATION:  3307                         FACILITY:  [facility]   PHYSICIAN:  Corinna L. Lendell Caprice, MDDATE OF BIRTH:  Jun 17, 1945   DATE OF ADMISSION:  09/18/2006  DATE OF DISCHARGE:                              HISTORY & PHYSICAL   CHIEF COMPLAINT:  Shortness of breath.   HISTORY OF PRESENT ILLNESS:  Melanie Best is a pleasant 65 year old black  female with COPD, who has been progressively more short of breath over  the past 2 to 3 days.  She denies cough.  She denies fevers or chills.  She has a nebulizer machine and oxygen at home.  She uses the oxygen  p.r.n. but has not had any relief with the nebulizers or home oxygen.  She is on chronic prednisone.  She received several nebulizer treatments  in the emergency room, as well as Solu-Medrol.  She feels a bit better  but is still significantly short of breath.  She has been  on BiPAP in the past but never been intubated.  She has a bit of a sore  throat but Best sinus congestion, Best cough.   PAST MEDICAL HISTORY:  1. COPD with p.r.n. oxygen requirement.  2. Tremor.  3. Gastroesophageal reflux disease.  4. History of LEEP procedure and cold knife cone.  She has cervical      dilatation, curettage of endometrium.  In April of 2008, pathology      shows Best malignancy.   MEDICATIONS:  1. Duonebs as needed.  2. Combivent as needed.  3. Prevacid 30 mg a day.  4. Chronic prednisone of 5 mg a day.  5. Angeliq, dose unknown.  6. Prempro, dose unknown.  7. Spiriva.   ALLERGIES:  Best KNOWN DRUG ALLERGIES.   SOCIAL HISTORY:  The patient quit smoking 2 years ago.  She used to  smoke a pack of cigarettes a day.  She does not drink or use drugs.  She  is here with her husband, and she is disabled.   REVIEW OF SYSTEMS:  As above, otherwise negative.   EXAMINATION:  VITAL SIGNS:  Temperature is 97.3, blood pressure 121/73,  pulse initially 132, currently 112.  Respiratory rate initially 28.  Oxygen saturation on 2 liters 92%.  GENERAL:  The patient is a uncomfortable-appearing black female who is  in the tripod position.  She is able to speak in short sentences.  HEENT:  Normocephalic, atraumatic.  Pupils equal, round, reactive to  light.  Mouth:  Best thrush.  NECK:  Best JVD, supple.  Best thyromegaly.  LUNGS:  Diminished throughout.  Best wheeze.  CARDIOVASCULAR:  Regular rhythm, fast rate.  ABDOMEN:  Soft, nontender.  GU/RECTAL:  Deferred.  EXTREMITIES:  Best clubbing, cyanosis or edema.  SKIN:  Best rash.  PSYCHIATRIC:  Normal affect.  NEUROLOGIC:  She has a tremor involving her hands and head, which she  says she has had for years.   LABORATORY:  White blood count 14.9, hemoglobin 14.3, hematocrit 42,  platelet count 425, basic  metabolic panel significant for a glucose of  214, otherwise unremarkable.  Chest x-ray shows COPD, Best infiltrate or  CHF.   ASSESSMENT/PLAN:  1. Chronic obstructive pulmonary disease exacerbation.  The patient      will be placed in the step-down unit, as her air movement is quite      poor, and she has been on BiPAP in the past and currently is not      tripoding.  She will get a Xopenex, Atrovent, Solu-Medrol. Sounds      like she may have a viral illness but Best bacterial pulmonary      infections, so I will not at this time give antibiotics.  2. Gastroesophageal reflux disease.  Continue proton pump inhibitor.  3. Hyperglycemia:  The patient denies previous history of      hyperglycemia with steroids.  I will check a hemoglobin A1c and      place the patient on sliding scale insulin.   In addition to above, I will check an EKG, as she is tachycardia, but it  looks to be sinus tach.      Corinna L. Lendell Caprice, MD  Electronically Signed     CLS/MEDQ  D:  09/18/2006  T:  09/18/2006  Job:  [id number]   cc:   Georgann Housekeeper, MD

## 2010-08-02 NOTE — Discharge Summary (Signed)
NAME:  Melanie Best, Melanie Best NO.:  [id number]   MEDICAL RECORD NO.:  [id number]          PATIENT TYPE:  INP   LOCATION:  2028                         FACILITY:  [facility]   PHYSICIAN:  Georgann Housekeeper, MD      DATE OF BIRTH:  January 30, 1946   DATE OF ADMISSION:  09/18/2006  DATE OF DISCHARGE:  09/20/2006                               DISCHARGE SUMMARY   DISCHARGE DIAGNOSES:  1. Chronic obstructive pulmonary disease exacerbation.  2. Mild hyperglycemia secondary to steroids.  3. History of gastroesophageal reflux disease.  4. History of benign tremors.   MEDICATIONS ON DISCHARGE:  1. Prednisone taper 60 mg for three days, 40 for three days, 20 for      three days, then 10 mg for three days, then 5 mg daily.  2. DuoNebs four times a day and nebulizer, Combivent inhaler two puffs      p.r.n. q.4-6.  3. Prevacid 30 mg daily.  4. Provera 2.5 mg daily.  5. Estratest tabs one tab daily.  6. Spiriva inhaler once daily.  7. Oxygen two liters nasal cannula.   Chest x-ray was no evidence of any pneumonia.   LAB DATA:  White count was elevated secondary to the steroids, at time  of discharge it was 22,000, came down from 43,000.  Blood chemistries  all normal.  Potassium 4.1, creatinine 0.9, glucose 139, hemoglobin A1c  of 5.6.   Blood pressure at time of discharge 124/75, heart rate of 90, sats 100%  on two liters.   HOSPITAL COURSE:  A 66 year old with history of severe COPD, O2  dependent, on prednisone, admitted with COPD exacerbation.  Chest x-ray  did not show any evidence of any pneumonia or bronchitis.  Patient was  started on Solu-Medrol and nebulizer treatments, improved breathing  status.  Her Solu-Medrol was changed to prednisone p.o. and continued on  nebulizer treatments.  Patient had mild hyperglycemia secondary to  steroid, but did not require any insulin.  Her A1c was normal.  As far  as her acid reflux disease, continue on Prevacid daily.  Patient would  be  followed up again next week.  Will repeat her white count again.      Georgann Housekeeper, MD  Electronically Signed     KH/MEDQ  D:  09/20/2006  T:  09/20/2006  Job:  [id number]

## 2010-08-02 NOTE — H&P (Signed)
NAME:  Melanie Best, BOUGHER               ACCOUNT NO.:  [id number]   MEDICAL RECORD NO.:  [id number]          PATIENT TYPE:  OBV   LOCATION:  3703                         FACILITY:  [facility]   PHYSICIAN:  Kela Millin, M.D.DATE OF BIRTH:  [birth year]   DATE OF ADMISSION:  08/13/2008  DATE OF DISCHARGE:                              HISTORY & PHYSICAL   PRIMARY CARE PHYSICIAN:  Dr. Georgann Housekeeper.   CHIEF COMPLAINT:  Chest pain.   HISTORY OF PRESENT ILLNESS:  The patient is a 65 year old black female  with past medical history significant for gastroesophageal reflux  disease, COPD, home O2 dependent, who presents with the above  complaints.  She states that she was awakened by chest pain at 5:30 a.m.  today - described a pressure, midsternal in location, 8/10 in intensity  and was intermittent over a 1-hour period, lasting 10 minutes at that  time.  She denies radiation, diaphoresis, nausea, vomiting, paresthesias  and no associated shortness of breath.  She also denies cough, fever,  dysuria, diarrhea, melena and no hematochezia.  The chest pain was  nonexertional, and she states that the aspirin that she was given  relieved her pain, and she is chest pain free at the time of my  interview.  The patient also complaining of low back pain.  She admits  to lifting furniture last week (a few days ago), and the pain started  sometime after that.  She was given some Percocet in the ED, and it  relieved her pain.   In the ED, the patient had point of care markers done which were  negative, and her EKG did not show any acute ischemic changes.  Chest x-  ray showed emphysema without acute disease.  She was admitted for  further evaluation and management.   PAST MEDICAL HISTORY:  As above this.   MEDICATIONS:  1. DuoNeb p.r.n.  2. Spiriva daily.  3. Ranitidine 150 mg.  4. Prednisone 10 mg daily.  5. Nexium 40 mg daily.  6. Clonazepam 1 mg half a tablet b.i.d.  7. Advair b.i.d.   ALLERGIES:  NKDA.   SOCIAL HISTORY:  She states she quit tobacco in 1972.  She denies  alcohol.   FAMILY HISTORY:  Reviewed and noncontributory to current illness; no  family history of MI.   REVIEW OF SYSTEMS:  As per HPI, other review of systems negative.   PHYSICAL EXAMINATION:  Dictation ended at this point.      Kela Millin, M.D.  Electronically Signed     ACV/MEDQ  D:  08/14/2008  T:  08/14/2008  Job:  [id number]

## 2010-08-02 NOTE — Discharge Summary (Signed)
NAME:  Melanie Best, DAY NO.:  [id number]   MEDICAL RECORD NO.:  [id number]          PATIENT TYPE:  OBV   LOCATION:  3703                         FACILITY:  [facility]   PHYSICIAN:  Georgann Housekeeper, MD      DATE OF BIRTH:  September 06, 1945   DATE OF ADMISSION:  08/13/2008  DATE OF DISCHARGE:  08/14/2008                               DISCHARGE SUMMARY   DISCHARGE DIAGNOSES:  1. Chest pain, atypical.  2. Gastroesophageal reflux disease, esophageal dysmotility.  3. Severe chronic obstructive pulmonary disease, O2 dependent.  4. Anxiety disorder.  5. Low back pain, mechanical.   MEDICATIONS ON DISCHARGE:  1. Albuterol nebulizers for home use every 3-4 hours.  2. Spiriva inhaler once a day.  3. Advair 250/50 one inhalation b.i.d.  4. Zantac 150 mg at bedtime.  5. Prednisone 10 mg daily.  6. Nexium 40 mg b.i.d. for 1 week then daily.  7. Clonazepam 1 mg half a tablet as needed.  8. Oxycodone 5 mg q.6-8 h. P.r.n.  9. Oxygen 2 L nasal cannula continuous.   X-ray shows mild DJD on the laboratory data.  Chest x-ray negative.  EKG  normal sinus rhythm.  White count of 12.1 and hemoglobin 11.5.  Cardiac  markers, troponin x3 negative.  CK was mildly elevated at 180 total.  Chest x-ray negative.  Spine x-ray showed some lumbar degenerative disk  disease, otherwise unremarkable.  Telemetry, normal sinus rhythm.   HOSPITAL COURSE:  A 65 year old with history of severe COPD, O2  dependent and oxygen presented with episode of chest pain woke up from  the sleep.   1. Cardiac.  The patient had normal sinus rhythm in the EKG and normal      chest x-ray.  Cardiac markers remained negative.  She did receive      nitroglycerin in the ED with some mixed results.  Her pain remained      free without any further medications.  She does have history of      GERD and was on Nexium and Zantac, and history of esophageal      dysmotility, most likely noncardiac.  We will plan to increase the  Nexium to 40 mg twice a day for 1 week and then Zantac at night, if      continues to have any trouble might consider outpatient workup for      cardiac.  2. COPD.  Continue on nebs Spiriva and Advair and prednisone.  Stable      chest x-ray.  3. Anxiety.  Continue on clonazepam.  4. Lower back pain.  The patient has been lifting some furniture about      a week ago.  X-rays of the spine was      negative for any fracture, showed some DJD.  We will get her some      oxycodone for pain and as needed.  Follow up in 1 week at the      office.   The patient was discharged in stable condition.      Georgann Housekeeper, MD  Electronically Signed  Georgann Housekeeper, MD  Electronically Signed    KH/MEDQ  D:  08/14/2008  T:  08/14/2008  Job:  [id number]

## 2010-08-02 NOTE — Op Note (Signed)
NAME:  Melanie Best, Melanie Best               ACCOUNT NO.:  [id number]   MEDICAL RECORD NO.:  [id number]          PATIENT TYPE:  AMB   LOCATION:  SDS                          FACILITY:  [facility]   PHYSICIAN:  Salley Scarlet., M.D.DATE OF BIRTH:  Jun 30, 1945   DATE OF PROCEDURE:  02/12/2007  DATE OF DISCHARGE:                               OPERATIVE REPORT   PREOPERATIVE DIAGNOSIS:  Immature cataract, right eye.   POSTOPERATIVE DIAGNOSIS:  Immature cataract, right eye.   OPERATION:  Kelman phacoemulsification of cataract, right eye, with  intraocular lens implantation.   ANESTHESIA:  Local using Xylocaine 2% with Marcaine 0.75% and Wydase.   JUSTIFICATION FOR PROCEDURE:  This is a 65 year old lady who complains  of blurring of vision with difficulty seeing to read and drive.  She was  evaluated and found to have a visual acuity best corrected to 20/100 on  the right and 20/60 on the left.  There were bilateral posterior  subcapsular cataracts, worse on the right than the left.  Cataract  extraction of the right eye was recommended with intraocular lens  implantation.  She is admitted at this time for that purpose.   PROCEDURE:  Under the influence of IV sedation, a Van Lint akinesia and  retrobulbar anesthesia were given.  The patient was prepped and draped  in the usual manner.  A lid speculum inserted under the upper and lower  lid of the right eye and a 4-0 silk traction suture was passed through  the belly of the superior rectus muscle for retraction.  A fornix-based  conjunctival flap was turned and hemostasis was achieved by using  cautery.  An incision was made in the sclera at the limbus.  This  incision was dissected down to clear cornea using a crescent blade.  A  sideport incision was made at the 1:30 clock hour position.  Ocucoat was  injected into the eye through the sideport incision.  The anterior  chamber was entered through the corneoscleral tunnel incision at the  11:30  clock hour position.  An anterior capsulotomy was done using a  bent 25-gauge needle.  The nucleus was hydrodissected using Xylocaine.  The KPE handpiece was passed into the eye and the nucleus was emulsified  without difficulty.  The residual cortical material was aspirated.  The  posterior capsule was polished using the olive-tip polisher.  The wound  was widened slightly to accommodate a foldable silicone lens.  The lens  was seated into the eye behind the iris without difficulty.  The  anterior chamber was reformed and the pupil constricted using Miochol.  The residual Ocucoat was aspirated from the eye.  The lips of the wound  were hydrated and tested to make sure that there was no leak.  It was  found that the wound was secure; therefore, a single 10-0 nylon  horizontal suture was used to close the corneoscleral wound.  The  conjunctiva was then closed over the wound using thermal cautery.  Celestone 1 mL and 0.5 mL of gentamicin were injected subconjunctivally.  Maxitrol ophthalmic ointment and Pilopine ointment were applied  along  with a patch and Fox shield.  The patient tolerated the procedure well  and was discharged to the post anesthesia recovery  room in satisfactory addition.  She is instructed to rest today, to take  Vicodin every 4 hours as needed for pain and to see me in the office  tomorrow for further evaluation.   DISCHARGE DIAGNOSIS:  Immature cataract, right eye.      Salley Scarlet., M.D.  Electronically Signed     TB/MEDQ  D:  02/12/2007  T:  02/12/2007  Job:  [id number]

## 2010-08-05 NOTE — Op Note (Signed)
NAME:  Melanie Best, Melanie Best                         ACCOUNT NO.:  [id number]   MEDICAL RECORD NO.:  [id number]                   PATIENT TYPE:  AMB   LOCATION:  SDC                                  FACILITY:  [facility]   PHYSICIAN:  Charles A. Sydnee Cabal, MD            DATE OF BIRTH:  07-Jul-1945   DATE OF PROCEDURE:  03/24/2003  DATE OF DISCHARGE:                                 OPERATIVE REPORT   PREOPERATIVE DIAGNOSES:  1. Inadequate colposcopy.  2. Positive endocervical curettage.  3. Cervical dysplasia.  4. Failed LEEP.   POSTOPERATIVE DIAGNOSES:  1. Inadequate colposcopy.  2. Positive endocervical curettage.  3. Cervical dysplasia.  4. Failed LEEP.   PROCEDURE:  Cold knife conization.   SURGEON:  Charles A. Delcambre, MD   ASSISTANT:  None.   COMPLICATIONS:  None.   ESTIMATED BLOOD LOSS:  Less than 25 mL.   ANESTHESIA:  General by endotracheal route.   SPECIMENS:  Cold knife cone specimen opened at 12 o'clock to pathology.   Sponge, needle and instrument counts were correct x2.   DESCRIPTION OF PROCEDURE:  The patient was taken to the operating room,  placed in the supine position after time out and ID of the patient and  procedure.  The patient was sterilely prepped and draped in the universal  stirrups in the dorsal lithotomy position.  Weighted speculum was placed in  the vagina.  Single-tooth tenaculum was placed on the anterior lip of the  cervix.  Stay sutures were placed in 4 and 8 o'clock with 0 Vicryl and held.  An 11 blade was then used to excise the cone specimen.  Running locking 0  Vicryl was then placed along the cervical mucosal  edge and including cone bed.  Adequate hemostasis was obtained.  Monsel's  solution was placed in the cone bed and hemostasis was verified.  Procedure  was terminated.  All instruments were removed and the patient was taken to  the recovery room with physician in attendance having tolerated the  procedure well.                                Charles A. Sydnee Cabal, MD    CAD/MEDQ  D:  03/24/2003  T:  03/24/2003  Job:  [id number]

## 2010-08-05 NOTE — H&P (Signed)
NAME:  Melanie Best, Melanie Best                         ACCOUNT NO.:  [id number]   MEDICAL RECORD NO.:  [id number]                   PATIENT TYPE:  AMB   LOCATION:  SDC                                  [facility]   PHYSICIAN:  Charles A. Delcambre, MD            DATE OF BIRTH:  May 01, 1945   DATE OF ADMISSION:  03/24/2003  DATE OF DISCHARGE:                                HISTORY & PHYSICAL   REASON FOR ADMISSION:  She is to be admitted on March 24, 2003 to undergo  cold-knife conization of the cervix secondary to inadequate colposcopy and  cervical dysplasia and failed LEEP therapy.   HISTORY OF PRESENT ILLNESS:  88 three year old para 4-0-0-4  postmenopausal noted on ACC recently in followup Pap smear February 10, 2003  showing detached fragments of dysplastic squamous mucosa.  Secondary to this  lesion and positive endocervical margins with previous LEEP procedure  showing low-grade dysplasia but positive endocervical margins as well as  ectocervical margins she is now to undergo cold-knife conization.  Recent  Pap smear showed Trichomonas, she was treated in this regard, LEEP had been  done October 14, 2002.   PAST MEDICAL HISTORY:  Nerves, mainly a head tremor for many years stable.   SURGICAL HISTORY:  LEEP, superficial blood clot on the abdominal wall  removed many years ago.   MEDICATIONS:  1. Fosamax once a week.  2. Amitriptyline dose not specified at bedtime.   ALLERGIES:  No known drug allergies.   SOCIAL HISTORY:  One half pack per day cigarette smoking, no alcohol or drug  abuse.  Patient is married.  Trichomonas as noted above.   REVIEW OF SYSTEMS:  No fever, chills, rashes or lesions, headaches,  dizziness, chest pain, shortness of breath, wheezing, diarrhea,  constipation, bleeding, melena, hematochezia, urgency, frequency, dysuria,  incontinence or hematuria, or emotional changes.   PHYSICAL EXAMINATION:  GENERAL:  Alert and oriented x3; no distress.  VITALS:   Respirations 16, pulse 64, weight 131 pounds, blood pressure  110/70.  HEENT:  Grossly within normal limits.  NECK:  Supple without thyromegaly or adenopathy.  LUNGS:  Clear bilaterally.  HEART:  Regular rate and rhythm without murmur, rub, or gallop.  BREAST:  Deferred.  ABDOMEN:  Soft, flat, and nontender; no hepatosplenomegaly or masses noted.  PELVIC:  Normal external female genitalia.  Bartholin, urethral, and Skene's  within normal limits.  Vault without discharge or lesions.  Uterus not  enlarged.  Ovaries not palpable bilaterally.  Exam nontender.   ASSESSMENT:  Inadequate colposcopy, positive endocervical margins as well as  ectocervical margins for dysplasia and now positive endocervical curettings.   PLAN:  Cold-knife conization.  She accepts risk of infection, bleeding,  bowel and bladder damage, blood product risks including hepatitis and HIV  exposure.  All questions were answered and we will proceed as outlined.  Schedule for morning of March 24, 2003.  Charles A. Sydnee Cabal, MD    CAD/MEDQ  D:  03/18/2003  T:  03/18/2003  Job:  [id number]

## 2010-08-05 NOTE — H&P (Signed)
NAME:  Melanie Best, DEMOND               ACCOUNT NO.:  [id number]   MEDICAL RECORD NO.:  [id number]           PATIENT TYPE:  AMB   LOCATION:                                [facility]   PHYSICIAN:  Charles A. Delcambre, MDDATE OF BIRTH:  21-Apr-1945   DATE OF ADMISSION:  07/04/2006  DATE OF DISCHARGE:                              HISTORY & PHYSICAL   This is a 65 year old para 4-0-0-4, status post cervical conization  January 2005, low-grade dysplasia, negative margins, who presents now  after having an ultrasound by her family practice doctor, Dr. Donette Larry,  noting thickened endometrial lining, heterogeneous without  postmenopausal bleeding.  It was 20 mm, and this is suspicious for  hyperplasia or even carcinoma or polyp.  She was to have an endometrial  biopsy today, June 05, 2006, but this was abandoned after difficulty  locating the ectocervical os was found, consistent with a cone biopsy  misshapen cervix.  She is to be admitted on June 25, 2006, to undergo  dilation curettage.   PAST MEDICAL HISTORY:  1. Nerves.  2. Head tremor.  3. COPD.  On O2 at home at night only.   PAST SURGICAL HISTORY:  1. LEEP.  2. Superficial blood clot on the abdominal wall removed many years      ago.  3. Cold-knife cone.   MEDICATIONS:  Amitriptyline for nerves and tremor, Combivent for COPD,  Zegerid for acid reflux, Spiriva for emphysema.   ALLERGIES:  No known drug allergies.   SOCIAL HISTORY:  Quit smoking in 2006.  History of Trichomonas in the  distant past.  Denies ethanol, drug use, or STD exposure otherwise.  She  is married in a monogamous relationship with her husband.   FAMILY HISTORY:  Denies family history of breast, uterus, ovary, cervix,  colon cancer, lymphoma, coronary artery disease, stroke, diabetes,  hypertension.   REVIEW OF SYSTEMS:  Denies fever or chills.  No rash or lesions.  No  headaches or dizziness.  No seasonal allergies.  No chest pain.  No  shortness of  breath currently but with exertion, she does get mild  shortness of breath.  No wheezing.  No diarrhea or constipation,  bleeding, melena, hematochezia, urgency, frequency, dysuria,  incontinence, hematuria, galacturia, emotional changes or vaginal  bleeding.   PHYSICAL EXAMINATION:  GENERAL:  Alert and oriented x3.  No distress.  5  feet 6 inches, 169 pounds, respirations 18, pulse 72, blood pressure  120/84.  HEENT:  Grossly within normal limits.  NECK:  Supple without thyromegaly or adenopathy.  HEART/LUNGS:  She will return 10 days prior to surgery for heart and  lung exam, previously having been negative to this point.  ABDOMEN:  Soft, flat, nontender.  No hepatosplenomegaly or masses noted.  BREASTS:  No masses, tenderness, discharge, skin or nipple changes.  PELVIC EXAM:  Normal external female genitalia.  Bartholin, urethral,  Skene's within normal limits.  Vault with a small amount of discharge  present.  Cervix appears to be flush with the cuff, and I cannot  definitively define the external os.  One  area of such that I felt was  the external os was probed, did admit small dilator approximately 1-2  cm.  I feel this was the cervix, but I could not pass a small dilator  secondary to stenosis.  Procedure was abandoned.  Bimanual examination  uterus not enlarged.  Adnexa nontender without masses bilaterally.  Ovaries not palpable (ovaries were normal on the left, and right not  seen).  Right ovary was normal, left not seen on the ultrasound done  May 29, 2006.  RECTAL:  Not done.  Anus and perineal body appear normal.   ASSESSMENT:  1. Very suspicious endometrial lining thickness without postmenopausal      bleeding suspicious for hyperplasia, polyp, or even carcinoma.  2. 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   PLAN:  Anesthesia with dilation and curettage will be scheduled for  June 25, 2006.  She will return for heart and lung exam prior to that  time.  We will get a preop with the anesthesiologist before surgery.  She understands risks of perforation, especially in light of the  stenotic cervix; damage to bowel, bladder, rectum, uterine perforation.  All questions are answered, and we will proceed as outlined.      Charles A. Sydnee Cabal, MD  Electronically Signed     CAD/MEDQ  D:  06/05/2006  T:  06/05/2006  Job:  [id number]

## 2010-08-05 NOTE — H&P (Signed)
NAME:  Melanie Best, Melanie Best               ACCOUNT NO.:  [id number]   MEDICAL RECORD NO.:  [id number]          PATIENT TYPE:  INP   LOCATION:  2926                         FACILITY:  [facility]   PHYSICIAN:  Hal T. Stoneking, M.D. DATE OF BIRTH:  31-Jan-1946   DATE OF ADMISSION:  06/07/2005  DATE OF DISCHARGE:                                HISTORY & PHYSICAL   IDENTIFYING DATA:  Melanie Best is a very nice 65 year old black female.  She  has a prior history of severe COPD last hospitalization in February of 2006.  At that time, she did stop smoking.  At home, she usually uses a combination  of a Combivent inhaler, Spiriva and DuoNebs.  She stated that she started  becoming more short of breath about 3 days ago.  She has had no fever.  Her  cough has been productive of clear sputum.  Dyspnea began to get  progressively worse.  She was seen in the emergency room in [place] and  started on a prednisone treatment of 60 mg a day.  She was to come into the  office today, but last night became increasingly short of breath.  She was  really showing significant trouble breathing with wheezing, and was placed  on BiPAP in the emergency room and given 125 mg of Solu-Medrol.  She states  that the BiPAP has helped.   PAST MEDICAL HISTORY:   ALLERGIES:  No known drug allergies.   CURRENT MEDICATIONS:  1.  Combivent two puffs t.i.d.  2.  Prevacid 30 mg a day.  3.  Spiriva one puff a day.  4.  DuoNeb q.i.d.   PAST MEDICAL HISTORY:  1.  COPD.  2.  GERD.  3.  History of anxiety.  4.  History of osteoporosis, although this diagnosis is not clear.  5.  She has had abdominal surgery for a hematoma in the past.   FAMILY HISTORY:  Sister has had some form of pulmonary disease.  One brother  has some type of cancer, but she is not sure.   SOCIAL HISTORY:  She is married and has 4 children.  She is a smoker.  She  is a disabled Catering manager.  She stopped smoking over a year ago.  She does not  consume alcohol.   REVIEW OF SYSTEMS:  No skin lesions.  No change in her vision.  No headache.  No fever.  No abdominal pain.  No change in bowel habits.   PHYSICAL EXAMINATION:  VITAL SIGNS:  Blood pressure 124/79, pulse 108,  respiratory rate 25.  Appears afebrile on exam, although they have not yet  been able to get a temperature.  O2 saturation of 100% on the BiPAP.  HEENT:  Pupils equal, round and reactive to light.  Disks are sharp.  Vasculature normal.  TMs normal.  Oropharynx moist.  Some milky white  postnasal drainage noted.  LUNGS;  Diffuse expiratory wheezes.  HEART:  Regular rate and rhythm with distant S1 and S2.  ABDOMEN:  No hepatosplenomegaly or masses palpated.  EXTREMITIES:  No lower extremity edema.  NEUROLOGIC:  She is alert and appropriate.  Nonfocal examination.   LABORATORY DATA:  Chest x-ray reveals findings of COPD only.  Sodium of 139,  potassium 3.6, chloride 107, BUN 11, glucose 110, pH of 7.33, PCO2 of 48,  hemoglobin 14.6, creatinine 0.7.  EKG is normal.   ASSESSMENT:  Chronic obstructive pulmonary disease exacerbation.  No  evidence for active infection.  At home, she has been on fairly high dose of  ipratropium with the Combivent, DuoNeb and Spiriva.  Will continue the  DuoNebs in the hospital.   PLAN:  Admit.  We will give her BiPAP support for at least 24 hours.  Continue DuoNebs and continue IV Solu-Medrol.  No antibiotics at present, as  there does not appear to be an active bacterial infection.           ______________________________  Sunday Spillers Pete Glatter, M.D.     HTS/MEDQ  D:  06/07/2005  T:  06/07/2005  Job:  [id number]

## 2010-08-05 NOTE — Op Note (Signed)
NAME:  Melanie Best, Melanie Best               ACCOUNT NO.:  [id number]   MEDICAL RECORD NO.:  [id number]          PATIENT TYPE:  AMB   LOCATION:  SDC                           [facility]   PHYSICIAN:  Charles A. Delcambre, MDDATE OF BIRTH:  12/21/1945   DATE OF PROCEDURE:  07/04/2006  DATE OF DISCHARGE:                               OPERATIVE REPORT   PREOPERATIVE DIAGNOSIS:  Postmenopausal bleeding on unopposed estrogen.   POSTOPERATIVE DIAGNOSIS:  Postmenopausal bleeding on unopposed estrogen.   PROCEDURE:  1. Cervical dilation.  2. Curettage of the endometrium.  3. Paracervical block.   SURGEON:  Dr. Sydnee Cabal.   ASSISTANT:  None.   COMPLICATIONS:  None.   ESTIMATED BLOOD LOSS:  Less than 10 mL.   SPECIMEN:  Endometrial curettings, large amount to pathology.   ANESTHESIA:  General by the endotracheal by the laryngeal route.   COMPLICATIONS:  None.  Instrument, sponge and needle count correct x2.   DESCRIPTION OF PROCEDURE:  The patient was taken to the operating room,  placed in supine position.  Anesthesia was induced without difficulty.  She was placed in dorsal lithotomy position in universal stirrups and  sterile prep and drape was undertaken. A speculum was placed in the  vagina.  Tenaculum was used to grasp what appeared to be a flush cervix  at the apex of the vagina with an apparent os.  This was carefully  explored with wire guide, and with some degree of difficulty, entry into  the endometrial cavity was made.  There was no evidence of perforation.  Further wire guides were used to dilate and then small Hanks dilators  and then regular Hanks dilators were used enough to pass a 1-cm curette.  Sound was 8 cm.  There was no evidence of perforation.  Large amount of  curettings were curetted circumferentially.  Again, no evidence of  perforation was noted.  All instruments were removed.  Tenaculum was  removed.  Hemostasis was excellent.  The patient was taken to  recovery  with physician in attendance, having tolerated the procedure well.      Charles A. Sydnee Cabal, MD  Electronically Signed     CAD/MEDQ  D:  07/04/2006  T:  07/04/2006  Job:  [id number]   cc:   Georgann Housekeeper, MD  Fax: [phone]

## 2010-08-05 NOTE — H&P (Signed)
NAME:  [name], [name] NO.:  [id number]   MEDICAL RECORD NO.:  [id number]          PATIENT TYPE:  INP   LOCATION:  2040                         FACILITY:  [facility]   PHYSICIAN:  Candyce Churn, M.D.DATE OF BIRTH:  30-May-1945   DATE OF ADMISSION:  05/12/2004  DATE OF DISCHARGE:                                HISTORY & PHYSICAL   DATE OF WRITTEN H&P:  May 12, 2004.   DATE OF DICTATED H&P:  May 15, 2004.   CHIEF COMPLAINT:  Shortness of breath.   HISTORY OF PRESENT ILLNESS:  [name] is a very pleasant  African-American female with history of COPD, GERD, and anxiety, who  presents with approximately 24 hours of increasing shortness of breath.  She  had a sore throat on the day prior to admission.  She denies any fever,  chills, or productive cough.  She complained of increased shortness of  breath on the morning when she came to the emergency room, and her chest x-  ray in the ER was consistent with severe COPD.  She does not have a history  of DVT or PE.  She has not complained of chest pain.  She does continue to  smoke.  She has moderate increased work of breathing in the emergency room  and is admitted for further therapy.   ALLERGIES:  No known drug allergies.   CURRENT MEDICATIONS:  1.  Elavil 25 mg p.o. q.h.s.  2.  Combivent inhaler 2 puffs inhaled p.r.n.  3.  Combivent nebulizers four times a day at home.  4.  Fosamax 70 mg weekly.  5.  Nexium 40 mg daily.   PAST SURGICAL HISTORY:  1.  Colposcopy January 2005.  2.  Left upper abdominal surgery for questionable abdominal hematoma in the      past.   GYN HISTORY:  Gravida 4, para 4.   FAMILY HISTORY:  Noncontributory.   SOCIAL HISTORY:  The patient is married.  Her husband has colon cancer.  She  has four children, and one son was present in the emergency room.  She had a  job Clinical cytogeneticist.  Denies alcohol use.  She has been smoking for  many years and  continues to actively smoke.   REVIEW OF SYSTEMS:  As per HPI.  No hemoptysis.  No history of heart  problems.  Denies hyperlipidemia.  No recent change in bowel or urinary  habits.   PHYSICAL EXAMINATION:  GENERAL:  The patient is a slender, middle-aged  female in moderate respiratory distress.  Breath sounds are very quiet,  though she does have some increased work of breathing.  VITAL SIGNS:  O2 saturation 97% on 2 liters, pulse 120 just after nebulizer  therapy.  Blood pressure 133/67, respiratory rate 30 and slightly labored,  temperature 99.8.  HEENT:  Possibility of a small amount of thrush.  NECK:  Supple.  No JVD, no thyromegaly, no carotid bruits.  CHEST:  Decreased breath sounds throughout.  She does have some very quiet  end-expiratory wheezes throughout.  Expiratory phase is actually greater  than  inspiratory phase.  CARDIAC:  Increased rate without murmur.  ABDOMEN:  Soft, nontender.  No obvious organomegaly.  EXTREMITIES:  Without cyanosis, clubbing, or pain to palpation.  Good  capillary refill distally.  NEUROLOGIC:  Oriented x 3.  Normal, nonfocal exam.  SKIN:  Without rashes.   LABORATORY DATA AND OTHER STUDIES:  Chest x-ray reveals COPD, no  infiltrates.   EKG reveals sinus tachycardia at 135.  No ST segment elevations or  depressions present.   White count 13,500.  Hemoglobin 12.6, platelet count 274,000.  Calcium 8.0,  sodium 135, potassium 3.2, chloride 104, bicarb 27, BUN 8, creatinine 0.7,  glucose 163.  BNP 56.2, normal.   ASSESSMENT:  41.  A 65 year old female with severe chronic obstructive pulmonary disease,      likely with a viral upper respiratory infection.  2.  History of anxiety.  3.  Gastroesophageal reflux disease.  4.  Questionable osteoporosis.  5.  Increased heart rate which may be secondary to albuterol therapy.   PLAN:  1.  COPD:  Xopenex q.4h., Solu-Medrol 60 mg q.12h., nasal cannula O2 to keep      O2 saturation between 90 and  94%.  Start azithromycin and check ABG.  2.  Tobacco use:  Use nicotine patch.  3.  Anxiety:  Start Xanax 0.25 mg p.o. t.i.d.  It may be SSRI chronically.  4.  History of osteoporosis, aware.  5.  GERD:  Will change Nexium to Protonix per hospital formula at 40 mg      daily.  6.  Increased heart rate:  Use Xopenex.  Check TFT's, serial cardiac      enzymes, consider Diltiazem use to slow heart rate.  Will check D-dimer      to rule out possibility of PE.      RNG/MEDQ  D:  05/15/2004  T:  05/15/2004  Job:  [id number]   cc:   Georgann Housekeeper, MD  [address]  [place]  [place] [address]  Fax: [phone]

## 2010-08-05 NOTE — Op Note (Signed)
NAME:  Melanie Best, Melanie Best NO.:  [id number]   MEDICAL RECORD NO.:  [id number]           PATIENT TYPE:   LOCATION:                                 FACILITY:   PHYSICIAN:  Salley Scarlet., M.D.DATE OF BIRTH:  [birth year]   DATE OF PROCEDURE:  09/13/2007  DATE OF DISCHARGE:                               OPERATIVE REPORT   PREOPERATIVE DIAGNOSIS:  Immature cataract, left eye.   POSTOPERATIVE DIAGNOSIS:  Immature cataract, left eye.   OPERATION:  Kelman phacoemulsification cataract left eye with  intraocular lens implantation.   ANESTHESIA:  Local using Xylocaine, 2% Marcaine, 0.75% Wydase.   JUSTIFICATION OF PROCEDURE:  This is a 65 year old lady who underwent a  cataract extraction from the right eye in November, 2008.  She has  subsequently developed a cataract of the left eye and has complained of  difficulty seeing to read.  She was evaluated and found to have an  intraocular lens present on the right with a 2+ nuclear cataract in her  left eye with a visual acuity best corrected to 20/50.  Cataract  extraction of the left eye was recommended.  She is admitted this time  for that purpose.   PROCEDURE:  Under influence of IV sedation, a Van Lint akinesia and  retrobulbar anesthesia was given.  The patient was prepped and draped in  the usual manner.  The lid speculum was inserted under the upper and  lower lid of the left eye and a 4-0 silk traction suture was passed  through the belly of the superior rectus muscle for traction.  A fornix-  based conjunctival flap was turned and hemostasis achieved using  cautery.  An incision made in the sclera at the limbus.  This incision  was dissected down into clear cornea using a crescent blade.  A sideport  incision made at 1:30 o'clock position.  OcuCoat was injected into the  eye through the sideport incision.  The anterior chamber was entered  through the corneoscleral tunnel incision at 11:30 o'clock position  using 2.75 mm keratome.  An anterior capsulotomy was done using bent 25  gauge needle.  The nucleus was hydrodissected using Xylocaine.  The KPE  handpiece was passed into the eye and the nucleus was emulsified without  difficulty.  The residual cortical material was aspirated.  The  posterior capsule was polished using olive-tip polisher.  The wound was  widened slightly to accommodate a foldable silicone lens.  The lens was  seated into the eye behind iris without difficulty.  The anterior  chamber was reformed and the pupil was constricted using Miochol.  The  lips of the wound were hydrated and tested to see if there was no leak.  It was found that there was small leak, therefore, the wound was closed  by using a single horizontal suture of 10-0 nylon.  After ascertaining  that the wound was airtight and watertight, the conjunctiva was closed  over wound using thermal cautery.  A 1 mL of Celestone and 0.5 mL of  gentamicin were injected subconjunctivally.  Maxitrol ophthalmic  ointment  and prilocaine ointment were applied along with the patch and  Fox shield.  The patient tolerated procedure well and discharged to the  post anesthesia recovery satisfactory  condition.  She is instructed to rest today, to take Darvocet N 100  every 4 hours as needed for pain and see me in office tomorrow for  further evaluation.   DISCHARGE DIAGNOSIS:  Immature cataract, left eye.      Salley Scarlet., M.D.  Electronically Signed     TB/MEDQ  D:  09/13/2007  T:  09/13/2007  Job:  [id number]

## 2010-08-05 NOTE — Op Note (Signed)
NAME:  [name], [name] NO.:  [id number]   MEDICAL RECORD NO.:  [id number]          PATIENT TYPE:  AMB   LOCATION:  ENDO                         FACILITY:  [facility]   PHYSICIAN:  Danise Edge, M.D.   DATE OF BIRTH:  March 15, 1946   DATE OF PROCEDURE:  05/24/2004  DATE OF DISCHARGE:                                 OPERATIVE REPORT   PROCEDURE:  Esophagogastroduodenoscopy.   ENDOSCOPIST:  Danise Edge, M.D.   INDICATIONS:  Ms. Idelia Caudell is a 65 year old female, born [birth year].  Ms. Purington has chronic gastroesophageal reflux. If she takes her Nexium each  morning, she does not experience heartburn or dysphagia. If she does not  take Nexium, she will experience intermittent dysphagia. She denies  odynophagia.   PREMEDICATION:  Versed 6 milligrams, Demerol 50 milligrams.   DESCRIPTION OF PROCEDURE:  After obtaining an informed consent, Ms. Plyler  was placed in the left lateral decubitus position. I administered  intravenous Demerol and intravenous Versed to achieve conscious sedation for  the procedure. The patient's blood pressure, oxygen saturation and cardiac  rhythm were monitored throughout the procedure and documented in the medical  record.   The Olympus gastroscope was passed through the posterior hypopharynx into  the proximal esophagus without difficulty. The hypopharynx, larynx and vocal  cords appeared normal.   Esophagoscopy: The proximal mid and lower segments of the esophageal mucosa  appeared normal. There is no endoscopic evidence for the presence of erosive  esophagitis, Barrett's esophagus, esophageal mucosal scarring, or esophageal  stricture formation.   Gastroscopy: Retroflex view of the gastric cardia and fundus was normal. The  diaphragmatic hiatus is slightly patulous. There is a moderate amount of  residual solid food matter in the dependent portion of the gastric body. The  gastric antrum and pylorus appeared normal.   Duodenoscopy: The duodenal bulb and descending duodenum appeared normal.   ASSESSMENT:  Normal esophagogastroduodenoscopy.  A moderate amount of  residual solid food in the dependent portion of the gastric body, suggesting  an element of delayed gastric emptying.      MJ/MEDQ  D:  05/24/2004  T:  05/24/2004  Job:  [id number]   cc:   Candyce Churn, M.D.  [address] [place]  [place] [address]  Fax: [phone]

## 2010-08-05 NOTE — Discharge Summary (Signed)
NAME:  Melanie Best, Melanie Best NO.:  [id number]   MEDICAL RECORD NO.:  [id number]          PATIENT TYPE:  INP   LOCATION:  2040                         FACILITY:  [facility]   PHYSICIAN:  Georgann Housekeeper, MD      DATE OF BIRTH:  [birth year]   DATE OF ADMISSION:  05/12/2004  DATE OF DISCHARGE:  05/17/2004                                 DISCHARGE SUMMARY   DISCHARGE DIAGNOSES:  1.  Chronic obstructive pulmonary disease exacerbation.  2.  Bronchitis.  3.  Subclinical hypothyroidism without any symptoms.  4.  Acid reflux disease.   CONDITION ON DISCHARGE:  Stable.   DISCHARGE MEDICATIONS:  1.  Prednisone taper with 60 mg for three days, 40 for three days, 20 for      three days.  2.  Spiriva handheld inhaler once a day.  3.  Avelox 400 mg once a day for one week.  4.  Atrovent inhaler.  5.  Albuterol nebulizers four times a day and as needed.  6.  Oxygen 2 L nasal cannula p.r.n.  7.  Nicotine patch 14 g for three weeks per day and then 7 g per day for      three weeks.  8.  Nexium 40 mg once a day.   Home health and follow up in one week.   ACTIVITY:  As tolerated.   HOSPITAL COURSE:  #1 - RESPIRATORY:  Patient admitted with shortness of  breath.  Patient has severe COPD.  She had normal white count.  White count  was mildly low at 13,000.  Chest x-ray was negative for any infiltrates.  Showed severe COPD.  EKG was sinus tachycardia without any ST-T wave  changes.  BNP and cardiac enzymes were negative.  The patient started on  nebulizers, Solu-Medrol.  O2 saturations with oxygen was 90-94%.  Patient  also continues to have the use of tobacco with use of nicotine patch.  As  far as during the hospital course patient required frequent nebulizers and  Solu-Medrol which improved her respiratory condition.  She was tachycardic  secondary to her nebulizers and that is why Xopenex was used.  Hemodynamically remained stable.  Blood work and chemistries remained  normal.  She  did have mild leukocyte reaction from the Solu-Medrol, but no  evidence of re-infection on repeat chest x-ray.  She was originally on  azithromycin.  Was put on Avelox for better coverage.  Continued on p.o.  Avelox.  As far as the mild tachycardia, her thyroid was checked.  TSH was  mildly suppressed but  T4 and T3 was normal consistent with subclinical hypothyroidism.  Will need  to be followed up as outpatient.  As far as her anxiety, continue on Xanax  p.r.n.  For her COPD patient was added on Spiriva inhaler which helped her  significantly.  Follow up in one week at the office.      KH/MEDQ  D:  05/17/2004  T:  05/17/2004  Job:  [id number]

## 2010-08-05 NOTE — Discharge Summary (Signed)
NAME:  Melanie Best, Melanie Best NO.:  [id number]   MEDICAL RECORD NO.:  [id number]          PATIENT TYPE:  INP   LOCATION:  2034                         FACILITY:  [facility]   PHYSICIAN:  Georgann Housekeeper, MD      DATE OF BIRTH:  December 07, 1945   DATE OF ADMISSION:  06/07/2005  DATE OF DISCHARGE:  06/13/2005                                 DISCHARGE SUMMARY   DISCHARGE DIAGNOSES:  1.  Severe chronic obstructive pulmonary disease exacerbation.  2.  Hypoxia.  3.  Acid reflux disease.   CONDITION ON DISCHARGE:  Stable.   MEDICATIONS ON DISCHARGE:  1.  Prednisone 20 mg three tablets for 3 days, two tablets for 3 days, one      tablet daily after.  2.  DuoNeb 4 times a day.  3.  Spiriva inhaler once a day.  4.  Prevacid 30 mg daily.  5.  Colace 100 mg twice daily.  6.  Oxygen 1- to 2-L nasal cannula.  7.  Calcium plus vitamin D two a day.  8.  Combivent inhaler two puffs q.4 h. p.r.n.   DISCHARGE INSTRUCTIONS:  Home health regimen and O2.   LABORATORY AND ACCESSORY CLINICAL DATA:  Her chest x-ray was negative.   Lab work was unremarkable.  White count was mildly elevated secondary to  steroids.   HOSPITAL COURSE:  [age]-year-old female with history of severe COPD,  admitted with worsening shortness of breath and dyspnea.  She was in  [place] and started having some shortness of breath, was given some  prednisone, but came in.  She had hypoxia initially.  She required BiPAP in  the emergency room and continued to require BiPAP for the next 48 hours and  started on IV Solu-Medrol and nebulizer with albuterol and Atrovent and the  patient slowly improved.  She did develop some tachycardia and was switched  to Xopenex for a brief period.  Her Solu-Medrol was tapered down slowly to  prednisone p.o.  The patient's oxygenation moved better; she was off the  BiPAP state while on the 2-L nasal cannula, down to 1 L.  Repeat chest x-ray  showed severe COPD without any evidence  of any pneumonia or bronchitis.  The  patient did not have any fevers.  No sputum.  As far as her lab work, it was  all unremarkable.  The patient had mild constipation during the hospital  course and required  some laxatives.  As far the acid reflux disease, continued on Protonix in  the hospital; she will be on Prevacid at home and on followup in 1 week at  the office, we will have home health arrangements and oxygen as well as  nebulizers.      Georgann Housekeeper, MD  Electronically Signed     KH/MEDQ  D:  06/13/2005  T:  06/14/2005  Job:  [id number]

## 2010-09-29 ENCOUNTER — Other Ambulatory Visit: Payer: Self-pay | Admitting: Oncology

## 2010-09-29 ENCOUNTER — Encounter (HOSPITAL_BASED_OUTPATIENT_CLINIC_OR_DEPARTMENT_OTHER): Payer: Medicare Other | Admitting: Oncology

## 2010-09-29 DIAGNOSIS — J4489 Other specified chronic obstructive pulmonary disease: Secondary | ICD-10-CM

## 2010-09-29 DIAGNOSIS — D72829 Elevated white blood cell count, unspecified: Secondary | ICD-10-CM

## 2010-09-29 DIAGNOSIS — J449 Chronic obstructive pulmonary disease, unspecified: Secondary | ICD-10-CM

## 2010-09-29 LAB — CBC WITH DIFFERENTIAL/PLATELET
BASO%: 0.2 % (ref 0.0–2.0)
EOS%: 0.1 % (ref 0.0–7.0)
HGB: 12.1 g/dL (ref 11.6–15.9)
MCH: 31.6 pg (ref 25.1–34.0)
MCHC: 32.9 g/dL (ref 31.5–36.0)
MONO%: 2.2 % (ref 0.0–14.0)
RBC: 3.85 10*6/uL (ref 3.70–5.45)
RDW: 15 % — ABNORMAL HIGH (ref 11.2–14.5)
lymph#: 1.7 10*3/uL (ref 0.9–3.3)

## 2010-10-11 ENCOUNTER — Other Ambulatory Visit: Payer: Self-pay | Admitting: Internal Medicine

## 2010-10-11 DIAGNOSIS — Z1231 Encounter for screening mammogram for malignant neoplasm of breast: Secondary | ICD-10-CM

## 2010-11-08 ENCOUNTER — Encounter (HOSPITAL_COMMUNITY): Payer: Medicare Other | Attending: Internal Medicine

## 2010-11-08 DIAGNOSIS — K219 Gastro-esophageal reflux disease without esophagitis: Secondary | ICD-10-CM | POA: Insufficient documentation

## 2010-11-08 DIAGNOSIS — J4489 Other specified chronic obstructive pulmonary disease: Secondary | ICD-10-CM | POA: Insufficient documentation

## 2010-11-08 DIAGNOSIS — R0902 Hypoxemia: Secondary | ICD-10-CM | POA: Insufficient documentation

## 2010-11-08 DIAGNOSIS — Z5189 Encounter for other specified aftercare: Secondary | ICD-10-CM | POA: Insufficient documentation

## 2010-11-08 DIAGNOSIS — J449 Chronic obstructive pulmonary disease, unspecified: Secondary | ICD-10-CM | POA: Insufficient documentation

## 2010-11-10 ENCOUNTER — Encounter (HOSPITAL_COMMUNITY)
Admission: RE | Admit: 2010-11-10 | Discharge: 2010-11-10 | Payer: Medicare Other | Source: Ambulatory Visit | Attending: Internal Medicine | Admitting: Internal Medicine

## 2010-11-14 ENCOUNTER — Ambulatory Visit
Admission: RE | Admit: 2010-11-14 | Discharge: 2010-11-14 | Disposition: A | Discharge status: Home / Self Care | Payer: Medicare Other | Source: Ambulatory Visit | Attending: Internal Medicine | Admitting: Internal Medicine

## 2010-11-14 DIAGNOSIS — Z1231 Encounter for screening mammogram for malignant neoplasm of breast: Secondary | ICD-10-CM

## 2010-11-15 ENCOUNTER — Encounter (HOSPITAL_COMMUNITY): Payer: Medicare Other

## 2010-11-17 ENCOUNTER — Encounter (HOSPITAL_COMMUNITY): Payer: Medicare Other

## 2010-11-22 ENCOUNTER — Encounter (HOSPITAL_COMMUNITY): Payer: Medicare Other | Attending: Internal Medicine

## 2010-11-22 DIAGNOSIS — J4489 Other specified chronic obstructive pulmonary disease: Secondary | ICD-10-CM | POA: Insufficient documentation

## 2010-11-22 DIAGNOSIS — K219 Gastro-esophageal reflux disease without esophagitis: Secondary | ICD-10-CM | POA: Insufficient documentation

## 2010-11-22 DIAGNOSIS — R0902 Hypoxemia: Secondary | ICD-10-CM | POA: Insufficient documentation

## 2010-11-22 DIAGNOSIS — Z5189 Encounter for other specified aftercare: Secondary | ICD-10-CM | POA: Insufficient documentation

## 2010-11-22 DIAGNOSIS — J449 Chronic obstructive pulmonary disease, unspecified: Secondary | ICD-10-CM | POA: Insufficient documentation

## 2010-11-24 ENCOUNTER — Encounter (HOSPITAL_COMMUNITY): Payer: Medicare Other

## 2010-11-29 ENCOUNTER — Encounter (HOSPITAL_COMMUNITY): Payer: Medicare Other

## 2010-12-01 ENCOUNTER — Encounter (HOSPITAL_COMMUNITY): Payer: Medicare Other

## 2010-12-06 ENCOUNTER — Encounter (HOSPITAL_COMMUNITY): Payer: Medicare Other

## 2010-12-08 ENCOUNTER — Encounter (HOSPITAL_COMMUNITY): Payer: Medicare Other

## 2010-12-08 LAB — CBC
HCT: 35.5 — ABNORMAL LOW
Hemoglobin: 11.8 — ABNORMAL LOW
Hemoglobin: 11.8 — ABNORMAL LOW
Hemoglobin: 12.6
MCHC: 33
MCHC: 33
MCHC: 33.3
MCHC: 33.3
MCV: 99.5
MCV: 99.5
Platelets: 318
Platelets: 324
Platelets: 326
RBC: 3.6 — ABNORMAL LOW
RBC: 3.7 — ABNORMAL LOW
RBC: 3.8 — ABNORMAL LOW
RDW: 13.9
RDW: 14
RDW: 14.2

## 2010-12-08 LAB — COMPREHENSIVE METABOLIC PANEL
ALT: 24
AST: 25
Albumin: 3.3 — ABNORMAL LOW
CO2: 30
Calcium: 8.9
Creatinine, Ser: 0.95
GFR calc Af Amer: >60
Sodium: 142
Total Protein: 6.8

## 2010-12-08 LAB — DIFFERENTIAL
Basophils Absolute: 0
Lymphocytes Relative: 15
Monocytes Absolute: 0.7
Monocytes Relative: 4
Neutro Abs: 14.5 — ABNORMAL HIGH
Neutrophils Relative %: 81 — ABNORMAL HIGH

## 2010-12-08 LAB — BASIC METABOLIC PANEL
BUN: 15
CO2: 30
Chloride: 101
GFR calc non Af Amer: 55 — ABNORMAL LOW
Glucose, Bld: 139 — ABNORMAL HIGH
Potassium: 4.3
Sodium: 139

## 2010-12-08 LAB — I-STAT 8, (EC8 V) (CONVERTED LAB)
Acid-Base Excess: 5 — ABNORMAL HIGH
Chloride: 104
Glucose, Bld: 176 — ABNORMAL HIGH
Hemoglobin: 14.6
Potassium: 3.6
Sodium: 139
TCO2: 32
pH, Ven: 7.418 — ABNORMAL HIGH

## 2010-12-08 LAB — POCT I-STAT CREATININE: Operator id: 294511

## 2010-12-09 LAB — COMPREHENSIVE METABOLIC PANEL
ALT: 20
AST: 17
CO2: 27
Chloride: 97
GFR calc Af Amer: >60
GFR calc non Af Amer: >60
Glucose, Bld: 135 — ABNORMAL HIGH
Sodium: 134 — ABNORMAL LOW
Total Bilirubin: 0.5

## 2010-12-09 LAB — DIFFERENTIAL
Basophils Absolute: 0
Basophils Relative: 0
Eosinophils Absolute: 0
Eosinophils Relative: 0

## 2010-12-09 LAB — URINALYSIS, ROUTINE W REFLEX MICROSCOPIC
Ketones, ur: NEGATIVE
Nitrite: NEGATIVE
Protein, ur: NEGATIVE
Urobilinogen, UA: 0.2

## 2010-12-09 LAB — CBC
Hemoglobin: 13
MCV: 98.3
RBC: 3.93
WBC: 13.2 — ABNORMAL HIGH

## 2010-12-13 ENCOUNTER — Encounter (HOSPITAL_COMMUNITY): Payer: Medicare Other

## 2010-12-15 ENCOUNTER — Encounter (HOSPITAL_COMMUNITY): Payer: Medicare Other

## 2010-12-15 LAB — URINALYSIS, ROUTINE W REFLEX MICROSCOPIC
Bilirubin Urine: NEGATIVE
Ketones, ur: NEGATIVE
Protein, ur: NEGATIVE
Specific Gravity, Urine: 1.016
Urobilinogen, UA: 0.2

## 2010-12-15 LAB — BASIC METABOLIC PANEL
BUN: 14
CO2: 26
Chloride: 106
Glucose, Bld: 118 — ABNORMAL HIGH
Potassium: 4.4
Sodium: 142

## 2010-12-15 LAB — CBC
HCT: 36.6
Hemoglobin: 12.5
MCHC: 34.2
MCV: 96.4
RDW: 14.2

## 2010-12-15 LAB — URINE MICROSCOPIC-ADD ON

## 2010-12-20 ENCOUNTER — Encounter (HOSPITAL_COMMUNITY): Payer: Medicare Other | Attending: Internal Medicine

## 2010-12-20 ENCOUNTER — Other Ambulatory Visit: Payer: Self-pay | Admitting: Internal Medicine

## 2010-12-20 DIAGNOSIS — R0902 Hypoxemia: Secondary | ICD-10-CM | POA: Insufficient documentation

## 2010-12-20 DIAGNOSIS — K219 Gastro-esophageal reflux disease without esophagitis: Secondary | ICD-10-CM | POA: Insufficient documentation

## 2010-12-20 DIAGNOSIS — Z5189 Encounter for other specified aftercare: Secondary | ICD-10-CM | POA: Insufficient documentation

## 2010-12-20 DIAGNOSIS — J4489 Other specified chronic obstructive pulmonary disease: Secondary | ICD-10-CM | POA: Insufficient documentation

## 2010-12-20 DIAGNOSIS — J449 Chronic obstructive pulmonary disease, unspecified: Secondary | ICD-10-CM | POA: Insufficient documentation

## 2010-12-20 LAB — GLUCOSE, CAPILLARY: Glucose-Capillary: 139 mg/dL — ABNORMAL HIGH (ref 70–99)

## 2010-12-22 ENCOUNTER — Encounter (HOSPITAL_COMMUNITY): Payer: Medicare Other

## 2010-12-27 ENCOUNTER — Encounter (HOSPITAL_COMMUNITY): Payer: Medicare Other

## 2010-12-27 LAB — CBC
Hemoglobin: 13.3
MCV: 97.9
RBC: 4.11
WBC: 17.7 — ABNORMAL HIGH

## 2010-12-27 LAB — URINALYSIS, ROUTINE W REFLEX MICROSCOPIC
Glucose, UA: NEGATIVE
Ketones, ur: NEGATIVE
pH: 6

## 2010-12-27 LAB — URINE MICROSCOPIC-ADD ON

## 2010-12-27 LAB — BASIC METABOLIC PANEL
Chloride: 100
Creatinine, Ser: 1.05
GFR calc Af Amer: >60
Sodium: 136

## 2010-12-28 LAB — CBC
HCT: 38.3
Hemoglobin: 12.7
MCHC: 33.2
MCV: 98.2
Platelets: 368
RBC: 3.9
RDW: 13.7
WBC: 14.1 — ABNORMAL HIGH

## 2010-12-28 LAB — DIFFERENTIAL
Basophils Absolute: 0.1
Basophils Relative: 0
Eosinophils Absolute: 0.5
Eosinophils Relative: 3
Lymphocytes Relative: 30
Lymphs Abs: 4.1 — ABNORMAL HIGH
Monocytes Absolute: 0.6
Monocytes Relative: 4
Neutro Abs: 8.8 — ABNORMAL HIGH
Neutrophils Relative %: 63

## 2010-12-28 LAB — POCT I-STAT CREATININE: Operator id: 257131

## 2010-12-28 LAB — I-STAT 8, (EC8 V) (CONVERTED LAB)
Bicarbonate: 29.8 — ABNORMAL HIGH
Glucose, Bld: 106 — ABNORMAL HIGH
HCT: 43
Hemoglobin: 14.6
Operator id: 257131
Sodium: 139
TCO2: 31

## 2010-12-29 ENCOUNTER — Encounter (HOSPITAL_COMMUNITY): Payer: Medicare Other

## 2011-01-03 ENCOUNTER — Encounter (HOSPITAL_COMMUNITY): Payer: Medicare Other

## 2011-01-03 LAB — POCT I-STAT 3, ART BLOOD GAS (G3+)
Bicarbonate: 21.5
TCO2: 23
pCO2 arterial: 37.8
pH, Arterial: 7.363
pO2, Arterial: 88

## 2011-01-03 LAB — COMPREHENSIVE METABOLIC PANEL
AST: 18
Alkaline Phosphatase: 63
BUN: 16
CO2: 26
Chloride: 106
Creatinine, Ser: 0.93
GFR calc non Af Amer: >60
Potassium: 4.1
Total Bilirubin: 0.5

## 2011-01-03 LAB — CBC
HCT: 38.2
Hemoglobin: 12.6
Hemoglobin: 12.6
MCHC: 32.7
MCHC: 33.7
MCV: 100
MCV: 98.9
Platelets: 476 — ABNORMAL HIGH
RBC: 3.86 — ABNORMAL LOW
RBC: 3.88
RDW: 14.8 — ABNORMAL HIGH
WBC: 22.1 — ABNORMAL HIGH
WBC: 23.7 — ABNORMAL HIGH

## 2011-01-03 LAB — I-STAT 8, (EC8 V) (CONVERTED LAB)
BUN: 9
Bicarbonate: 22.5
Glucose, Bld: 214 — ABNORMAL HIGH
HCT: 42
Operator id: 282201
pCO2, Ven: 36.2 — ABNORMAL LOW
pH, Ven: 7.401 — ABNORMAL HIGH

## 2011-01-03 LAB — DIFFERENTIAL
Basophils Absolute: 0
Basophils Relative: 0
Eosinophils Absolute: 0
Monocytes Absolute: 0.1 — ABNORMAL LOW
Neutro Abs: 13.7 — ABNORMAL HIGH

## 2011-01-05 ENCOUNTER — Encounter (HOSPITAL_COMMUNITY): Payer: Medicare Other

## 2011-01-10 ENCOUNTER — Encounter (HOSPITAL_COMMUNITY): Payer: Medicare Other

## 2011-01-12 ENCOUNTER — Encounter (HOSPITAL_COMMUNITY): Payer: Medicare Other

## 2011-01-17 ENCOUNTER — Encounter (HOSPITAL_COMMUNITY): Payer: Medicare Other

## 2011-01-19 ENCOUNTER — Encounter (HOSPITAL_COMMUNITY): Payer: Medicare Other

## 2011-01-19 ENCOUNTER — Encounter (HOSPITAL_COMMUNITY)
Admission: RE | Admit: 2011-01-19 | Discharge: 2011-01-19 | Payer: Medicare Other | Source: Ambulatory Visit | Attending: Internal Medicine | Admitting: Internal Medicine

## 2011-01-19 DIAGNOSIS — J449 Chronic obstructive pulmonary disease, unspecified: Secondary | ICD-10-CM | POA: Insufficient documentation

## 2011-01-19 DIAGNOSIS — R0902 Hypoxemia: Secondary | ICD-10-CM | POA: Insufficient documentation

## 2011-01-19 DIAGNOSIS — Z5189 Encounter for other specified aftercare: Secondary | ICD-10-CM | POA: Insufficient documentation

## 2011-01-19 DIAGNOSIS — K219 Gastro-esophageal reflux disease without esophagitis: Secondary | ICD-10-CM | POA: Insufficient documentation

## 2011-01-19 DIAGNOSIS — J4489 Other specified chronic obstructive pulmonary disease: Secondary | ICD-10-CM | POA: Insufficient documentation

## 2011-01-24 ENCOUNTER — Encounter (HOSPITAL_COMMUNITY): Payer: Medicare Other

## 2011-01-26 ENCOUNTER — Encounter (HOSPITAL_COMMUNITY): Payer: Medicare Other

## 2011-01-26 ENCOUNTER — Telehealth: Payer: Self-pay | Admitting: Oncology

## 2011-01-26 NOTE — Telephone Encounter (Signed)
Pt called in re next f/u appt and was given appt for 1/4 @ 1 pm. appt made per mosaiq orders.

## 2011-01-31 ENCOUNTER — Encounter (HOSPITAL_COMMUNITY): Payer: Medicare Other

## 2011-02-02 ENCOUNTER — Encounter (HOSPITAL_COMMUNITY): Payer: Medicare Other

## 2011-02-02 ENCOUNTER — Encounter (HOSPITAL_COMMUNITY)
Admission: RE | Admit: 2011-02-02 | Discharge: 2011-02-02 | Disposition: A | Discharge status: Home / Self Care | Payer: Medicare Other | Source: Ambulatory Visit | Attending: Internal Medicine | Admitting: Internal Medicine

## 2011-02-07 ENCOUNTER — Encounter (HOSPITAL_COMMUNITY)
Admission: RE | Admit: 2011-02-07 | Discharge: 2011-02-07 | Disposition: A | Discharge status: Home / Self Care | Payer: Medicare Other | Source: Ambulatory Visit | Attending: Internal Medicine | Admitting: Internal Medicine

## 2011-02-07 ENCOUNTER — Encounter (HOSPITAL_COMMUNITY): Payer: Medicare Other

## 2011-02-09 ENCOUNTER — Encounter (HOSPITAL_COMMUNITY): Payer: Medicare Other

## 2011-02-14 ENCOUNTER — Encounter (HOSPITAL_COMMUNITY)
Admission: RE | Admit: 2011-02-14 | Discharge: 2011-02-14 | Disposition: A | Discharge status: Home / Self Care | Payer: Medicare Other | Source: Ambulatory Visit | Attending: Internal Medicine | Admitting: Internal Medicine

## 2011-02-14 ENCOUNTER — Encounter (HOSPITAL_COMMUNITY): Payer: Medicare Other

## 2011-02-16 ENCOUNTER — Encounter (HOSPITAL_COMMUNITY): Payer: Medicare Other

## 2011-02-21 ENCOUNTER — Encounter (HOSPITAL_COMMUNITY)
Admission: RE | Admit: 2011-02-21 | Discharge: 2011-02-21 | Disposition: A | Discharge status: Home / Self Care | Payer: Medicare Other | Source: Ambulatory Visit | Attending: Internal Medicine | Admitting: Internal Medicine

## 2011-02-21 ENCOUNTER — Encounter (HOSPITAL_COMMUNITY): Payer: Medicare Other

## 2011-02-21 DIAGNOSIS — R0902 Hypoxemia: Secondary | ICD-10-CM | POA: Insufficient documentation

## 2011-02-21 DIAGNOSIS — Z5189 Encounter for other specified aftercare: Secondary | ICD-10-CM | POA: Insufficient documentation

## 2011-02-21 DIAGNOSIS — K219 Gastro-esophageal reflux disease without esophagitis: Secondary | ICD-10-CM | POA: Insufficient documentation

## 2011-02-21 DIAGNOSIS — J4489 Other specified chronic obstructive pulmonary disease: Secondary | ICD-10-CM | POA: Insufficient documentation

## 2011-02-21 DIAGNOSIS — J449 Chronic obstructive pulmonary disease, unspecified: Secondary | ICD-10-CM | POA: Insufficient documentation

## 2011-02-23 ENCOUNTER — Encounter (HOSPITAL_COMMUNITY)
Admission: RE | Admit: 2011-02-23 | Discharge: 2011-02-23 | Disposition: A | Discharge status: Home / Self Care | Payer: Medicare Other | Source: Ambulatory Visit | Attending: Internal Medicine | Admitting: Internal Medicine

## 2011-02-23 ENCOUNTER — Encounter (HOSPITAL_COMMUNITY): Payer: Medicare Other

## 2011-02-28 ENCOUNTER — Encounter (HOSPITAL_COMMUNITY)
Admission: RE | Admit: 2011-02-28 | Discharge: 2011-02-28 | Disposition: A | Discharge status: Home / Self Care | Payer: Medicare Other | Source: Ambulatory Visit | Attending: Internal Medicine | Admitting: Internal Medicine

## 2011-02-28 ENCOUNTER — Encounter (HOSPITAL_COMMUNITY): Payer: Medicare Other

## 2011-03-02 ENCOUNTER — Encounter (HOSPITAL_COMMUNITY)
Admission: RE | Admit: 2011-03-02 | Discharge: 2011-03-02 | Disposition: A | Discharge status: Home / Self Care | Payer: Medicare Other | Source: Ambulatory Visit | Attending: Internal Medicine | Admitting: Internal Medicine

## 2011-03-07 ENCOUNTER — Encounter (HOSPITAL_COMMUNITY)
Admission: RE | Admit: 2011-03-07 | Discharge: 2011-03-07 | Disposition: A | Discharge status: Home / Self Care | Payer: Medicare Other | Source: Ambulatory Visit | Attending: Internal Medicine | Admitting: Internal Medicine

## 2011-03-09 ENCOUNTER — Encounter (HOSPITAL_COMMUNITY)
Admission: RE | Admit: 2011-03-09 | Discharge: 2011-03-09 | Disposition: A | Discharge status: Home / Self Care | Payer: Medicare Other | Source: Ambulatory Visit | Attending: Internal Medicine | Admitting: Internal Medicine

## 2011-03-14 ENCOUNTER — Encounter (HOSPITAL_COMMUNITY): Payer: Medicare Other

## 2011-03-16 ENCOUNTER — Encounter (HOSPITAL_COMMUNITY): Payer: Medicare Other

## 2011-03-17 ENCOUNTER — Encounter: Payer: Self-pay | Admitting: *Deleted

## 2011-03-21 ENCOUNTER — Encounter (HOSPITAL_COMMUNITY): Payer: Medicare Other | Attending: Internal Medicine

## 2011-03-21 DIAGNOSIS — J449 Chronic obstructive pulmonary disease, unspecified: Secondary | ICD-10-CM | POA: Insufficient documentation

## 2011-03-21 DIAGNOSIS — K219 Gastro-esophageal reflux disease without esophagitis: Secondary | ICD-10-CM | POA: Insufficient documentation

## 2011-03-21 DIAGNOSIS — J4489 Other specified chronic obstructive pulmonary disease: Secondary | ICD-10-CM | POA: Insufficient documentation

## 2011-03-21 DIAGNOSIS — R0902 Hypoxemia: Secondary | ICD-10-CM | POA: Insufficient documentation

## 2011-03-21 DIAGNOSIS — Z5189 Encounter for other specified aftercare: Secondary | ICD-10-CM | POA: Insufficient documentation

## 2011-03-23 ENCOUNTER — Encounter (HOSPITAL_COMMUNITY): Payer: Medicare Other

## 2011-03-24 ENCOUNTER — Other Ambulatory Visit: Payer: Medicare Other | Admitting: Lab

## 2011-03-24 ENCOUNTER — Ambulatory Visit: Payer: Medicare Other | Admitting: Oncology

## 2011-03-28 ENCOUNTER — Encounter (HOSPITAL_COMMUNITY): Payer: Medicare Other

## 2011-03-30 ENCOUNTER — Encounter (HOSPITAL_COMMUNITY): Payer: Medicare Other

## 2011-04-04 ENCOUNTER — Encounter (HOSPITAL_COMMUNITY): Payer: Medicare Other

## 2011-04-06 ENCOUNTER — Encounter (HOSPITAL_COMMUNITY): Payer: Medicare Other

## 2011-04-10 ENCOUNTER — Other Ambulatory Visit: Payer: Self-pay | Admitting: Urology

## 2011-04-10 ENCOUNTER — Ambulatory Visit (HOSPITAL_COMMUNITY)
Admission: RE | Admit: 2011-04-10 | Discharge: 2011-04-10 | Disposition: A | Discharge status: Home / Self Care | Payer: Medicare Other | Source: Ambulatory Visit | Attending: Urology | Admitting: Urology

## 2011-04-10 DIAGNOSIS — C649 Malignant neoplasm of unspecified kidney, except renal pelvis: Secondary | ICD-10-CM | POA: Insufficient documentation

## 2011-04-10 DIAGNOSIS — Z905 Acquired absence of kidney: Secondary | ICD-10-CM | POA: Insufficient documentation

## 2011-04-11 ENCOUNTER — Encounter (HOSPITAL_COMMUNITY): Payer: Medicare Other

## 2011-04-13 ENCOUNTER — Encounter (HOSPITAL_COMMUNITY): Payer: Medicare Other

## 2011-04-18 ENCOUNTER — Encounter (HOSPITAL_COMMUNITY): Payer: Medicare Other

## 2011-04-20 ENCOUNTER — Encounter (HOSPITAL_COMMUNITY): Payer: Medicare Other

## 2011-04-25 ENCOUNTER — Encounter (HOSPITAL_COMMUNITY)
Admission: RE | Admit: 2011-04-25 | Discharge: 2011-04-25 | Disposition: A | Discharge status: Home / Self Care | Payer: Self-pay | Source: Ambulatory Visit | Attending: Internal Medicine | Admitting: Internal Medicine

## 2011-04-25 ENCOUNTER — Encounter (HOSPITAL_COMMUNITY): Payer: Self-pay

## 2011-04-25 DIAGNOSIS — R0902 Hypoxemia: Secondary | ICD-10-CM | POA: Insufficient documentation

## 2011-04-25 DIAGNOSIS — Z5189 Encounter for other specified aftercare: Secondary | ICD-10-CM | POA: Insufficient documentation

## 2011-04-25 DIAGNOSIS — J449 Chronic obstructive pulmonary disease, unspecified: Secondary | ICD-10-CM | POA: Insufficient documentation

## 2011-04-25 DIAGNOSIS — K219 Gastro-esophageal reflux disease without esophagitis: Secondary | ICD-10-CM | POA: Insufficient documentation

## 2011-04-25 DIAGNOSIS — J4489 Other specified chronic obstructive pulmonary disease: Secondary | ICD-10-CM | POA: Insufficient documentation

## 2011-04-27 ENCOUNTER — Encounter (HOSPITAL_COMMUNITY)
Admission: RE | Admit: 2011-04-27 | Discharge: 2011-04-27 | Disposition: A | Discharge status: Home / Self Care | Payer: Self-pay | Source: Ambulatory Visit | Attending: Internal Medicine | Admitting: Internal Medicine

## 2011-04-27 ENCOUNTER — Encounter (HOSPITAL_COMMUNITY): Payer: Self-pay

## 2011-05-02 ENCOUNTER — Encounter (HOSPITAL_COMMUNITY): Payer: Self-pay

## 2011-05-02 ENCOUNTER — Encounter (HOSPITAL_COMMUNITY)
Admission: RE | Admit: 2011-05-02 | Discharge: 2011-05-02 | Disposition: A | Discharge status: Home / Self Care | Payer: Self-pay | Source: Ambulatory Visit | Attending: Internal Medicine | Admitting: Internal Medicine

## 2011-05-04 ENCOUNTER — Encounter (HOSPITAL_COMMUNITY)
Admission: RE | Admit: 2011-05-04 | Discharge: 2011-05-04 | Disposition: A | Discharge status: Home / Self Care | Payer: Self-pay | Source: Ambulatory Visit | Attending: Internal Medicine | Admitting: Internal Medicine

## 2011-05-09 ENCOUNTER — Encounter (HOSPITAL_COMMUNITY)
Admission: RE | Admit: 2011-05-09 | Discharge: 2011-05-09 | Disposition: A | Discharge status: Home / Self Care | Payer: Self-pay | Source: Ambulatory Visit | Attending: Internal Medicine | Admitting: Internal Medicine

## 2011-05-11 ENCOUNTER — Encounter (HOSPITAL_COMMUNITY)
Admission: RE | Admit: 2011-05-11 | Discharge: 2011-05-11 | Disposition: A | Discharge status: Home / Self Care | Payer: Self-pay | Source: Ambulatory Visit | Attending: Internal Medicine | Admitting: Internal Medicine

## 2011-05-16 ENCOUNTER — Encounter (HOSPITAL_COMMUNITY)
Admission: RE | Admit: 2011-05-16 | Discharge: 2011-05-16 | Disposition: A | Discharge status: Home / Self Care | Payer: Self-pay | Source: Ambulatory Visit | Attending: Internal Medicine | Admitting: Internal Medicine

## 2011-05-18 ENCOUNTER — Encounter (HOSPITAL_COMMUNITY)
Admission: RE | Admit: 2011-05-18 | Discharge: 2011-05-18 | Disposition: A | Discharge status: Home / Self Care | Payer: Self-pay | Source: Ambulatory Visit | Attending: Internal Medicine | Admitting: Internal Medicine

## 2011-05-23 ENCOUNTER — Encounter (HOSPITAL_COMMUNITY)
Admission: RE | Admit: 2011-05-23 | Discharge: 2011-05-23 | Disposition: A | Discharge status: Home / Self Care | Payer: Self-pay | Source: Ambulatory Visit | Attending: Internal Medicine | Admitting: Internal Medicine

## 2011-05-23 DIAGNOSIS — Z5189 Encounter for other specified aftercare: Secondary | ICD-10-CM | POA: Insufficient documentation

## 2011-05-23 DIAGNOSIS — R0902 Hypoxemia: Secondary | ICD-10-CM | POA: Insufficient documentation

## 2011-05-23 DIAGNOSIS — J449 Chronic obstructive pulmonary disease, unspecified: Secondary | ICD-10-CM | POA: Insufficient documentation

## 2011-05-23 DIAGNOSIS — J4489 Other specified chronic obstructive pulmonary disease: Secondary | ICD-10-CM | POA: Insufficient documentation

## 2011-05-23 DIAGNOSIS — K219 Gastro-esophageal reflux disease without esophagitis: Secondary | ICD-10-CM | POA: Insufficient documentation

## 2011-05-25 ENCOUNTER — Encounter (HOSPITAL_COMMUNITY)
Admission: RE | Admit: 2011-05-25 | Discharge: 2011-05-25 | Disposition: A | Discharge status: Home / Self Care | Payer: Self-pay | Source: Ambulatory Visit | Attending: Internal Medicine | Admitting: Internal Medicine

## 2011-05-30 ENCOUNTER — Encounter (HOSPITAL_COMMUNITY)
Admission: RE | Admit: 2011-05-30 | Discharge: 2011-05-30 | Disposition: A | Discharge status: Home / Self Care | Payer: Self-pay | Source: Ambulatory Visit | Attending: Internal Medicine | Admitting: Internal Medicine

## 2011-06-01 ENCOUNTER — Encounter (HOSPITAL_COMMUNITY)
Admission: RE | Admit: 2011-06-01 | Discharge: 2011-06-01 | Disposition: A | Discharge status: Home / Self Care | Payer: Self-pay | Source: Ambulatory Visit | Attending: Internal Medicine | Admitting: Internal Medicine

## 2011-06-06 ENCOUNTER — Encounter (HOSPITAL_COMMUNITY)
Admission: RE | Admit: 2011-06-06 | Discharge: 2011-06-06 | Disposition: A | Discharge status: Home / Self Care | Payer: Self-pay | Source: Ambulatory Visit | Attending: Internal Medicine | Admitting: Internal Medicine

## 2011-06-08 ENCOUNTER — Encounter (HOSPITAL_COMMUNITY)
Admission: RE | Admit: 2011-06-08 | Discharge: 2011-06-08 | Disposition: A | Discharge status: Home / Self Care | Payer: Self-pay | Source: Ambulatory Visit | Attending: Internal Medicine | Admitting: Internal Medicine

## 2011-06-13 ENCOUNTER — Encounter (HOSPITAL_COMMUNITY)
Admission: RE | Admit: 2011-06-13 | Discharge: 2011-06-13 | Disposition: A | Discharge status: Home / Self Care | Payer: Self-pay | Source: Ambulatory Visit | Attending: Internal Medicine | Admitting: Internal Medicine

## 2011-06-15 ENCOUNTER — Encounter (HOSPITAL_COMMUNITY)
Admission: RE | Admit: 2011-06-15 | Discharge: 2011-06-15 | Disposition: A | Discharge status: Home / Self Care | Payer: Self-pay | Source: Ambulatory Visit | Attending: Internal Medicine | Admitting: Internal Medicine

## 2011-06-20 ENCOUNTER — Encounter (HOSPITAL_COMMUNITY)
Admission: RE | Admit: 2011-06-20 | Discharge: 2011-06-20 | Disposition: A | Discharge status: Home / Self Care | Payer: Self-pay | Source: Ambulatory Visit | Attending: Internal Medicine | Admitting: Internal Medicine

## 2011-06-20 DIAGNOSIS — J449 Chronic obstructive pulmonary disease, unspecified: Secondary | ICD-10-CM | POA: Insufficient documentation

## 2011-06-20 DIAGNOSIS — J4489 Other specified chronic obstructive pulmonary disease: Secondary | ICD-10-CM | POA: Insufficient documentation

## 2011-06-20 DIAGNOSIS — K219 Gastro-esophageal reflux disease without esophagitis: Secondary | ICD-10-CM | POA: Insufficient documentation

## 2011-06-20 DIAGNOSIS — Z5189 Encounter for other specified aftercare: Secondary | ICD-10-CM | POA: Insufficient documentation

## 2011-06-20 DIAGNOSIS — R0902 Hypoxemia: Secondary | ICD-10-CM | POA: Insufficient documentation

## 2011-06-22 ENCOUNTER — Encounter (HOSPITAL_COMMUNITY)
Admission: RE | Admit: 2011-06-22 | Discharge: 2011-06-22 | Disposition: A | Discharge status: Home / Self Care | Payer: Self-pay | Source: Ambulatory Visit | Attending: Internal Medicine | Admitting: Internal Medicine

## 2011-06-27 ENCOUNTER — Encounter (HOSPITAL_COMMUNITY): Payer: Self-pay

## 2011-06-29 ENCOUNTER — Encounter (HOSPITAL_COMMUNITY): Payer: Self-pay

## 2011-07-04 ENCOUNTER — Encounter (HOSPITAL_COMMUNITY)
Admission: RE | Admit: 2011-07-04 | Discharge: 2011-07-04 | Disposition: A | Discharge status: Home / Self Care | Payer: Self-pay | Source: Ambulatory Visit | Attending: Internal Medicine | Admitting: Internal Medicine

## 2011-07-06 ENCOUNTER — Encounter (HOSPITAL_COMMUNITY)
Admission: RE | Admit: 2011-07-06 | Discharge: 2011-07-06 | Disposition: A | Discharge status: Home / Self Care | Payer: Self-pay | Source: Ambulatory Visit | Attending: Internal Medicine | Admitting: Internal Medicine

## 2011-07-11 ENCOUNTER — Encounter (HOSPITAL_COMMUNITY)
Admission: RE | Admit: 2011-07-11 | Discharge: 2011-07-11 | Disposition: A | Discharge status: Home / Self Care | Payer: Self-pay | Source: Ambulatory Visit | Attending: Internal Medicine | Admitting: Internal Medicine

## 2011-07-11 NOTE — Progress Notes (Signed)
[name] stated at the beginning of pulmonary rehab maintenance on 07/06/11 that she has been coughing (nonproductive)  more than usual since January and continues to have cough despite having been on cough medicine, RN enc pt to follow up with MD.  Pt came to pulmonary rehab maintenance program today stated "feeling better" check in vitals 148/80-114-100% 2L. D/C vitals 118/72-122-91% 2L pt was noted at that time to have audible congestion, ausculation of lungs found to be congestion and crackles all fields. Pt denied SOB, CP. RN called MD office for appointment for pt to be seen today. RN called pt to verify appoint today @ 2:45 p.m. Will follow up with pt in a.m.

## 2011-07-13 ENCOUNTER — Encounter (HOSPITAL_COMMUNITY): Admission: RE | Admit: 2011-07-13 | Payer: Self-pay | Source: Ambulatory Visit

## 2011-07-18 ENCOUNTER — Encounter (HOSPITAL_COMMUNITY)
Admission: RE | Admit: 2011-07-18 | Discharge: 2011-07-18 | Disposition: A | Discharge status: Home / Self Care | Payer: Self-pay | Source: Ambulatory Visit | Attending: Internal Medicine | Admitting: Internal Medicine

## 2011-07-20 ENCOUNTER — Encounter (HOSPITAL_COMMUNITY)
Admission: RE | Admit: 2011-07-20 | Discharge: 2011-07-20 | Disposition: A | Discharge status: Home / Self Care | Payer: Self-pay | Source: Ambulatory Visit | Attending: Internal Medicine | Admitting: Internal Medicine

## 2011-07-20 DIAGNOSIS — J449 Chronic obstructive pulmonary disease, unspecified: Secondary | ICD-10-CM | POA: Insufficient documentation

## 2011-07-20 DIAGNOSIS — Z5189 Encounter for other specified aftercare: Secondary | ICD-10-CM | POA: Insufficient documentation

## 2011-07-20 DIAGNOSIS — K219 Gastro-esophageal reflux disease without esophagitis: Secondary | ICD-10-CM | POA: Insufficient documentation

## 2011-07-20 DIAGNOSIS — J4489 Other specified chronic obstructive pulmonary disease: Secondary | ICD-10-CM | POA: Insufficient documentation

## 2011-07-20 DIAGNOSIS — R0902 Hypoxemia: Secondary | ICD-10-CM | POA: Insufficient documentation

## 2011-07-25 ENCOUNTER — Encounter (HOSPITAL_COMMUNITY)
Admission: RE | Admit: 2011-07-25 | Discharge: 2011-07-25 | Disposition: A | Discharge status: Home / Self Care | Payer: Self-pay | Source: Ambulatory Visit | Attending: Internal Medicine | Admitting: Internal Medicine

## 2011-07-27 ENCOUNTER — Encounter (HOSPITAL_COMMUNITY)
Admission: RE | Admit: 2011-07-27 | Discharge: 2011-07-27 | Disposition: A | Discharge status: Home / Self Care | Payer: Self-pay | Source: Ambulatory Visit | Attending: Internal Medicine | Admitting: Internal Medicine

## 2011-07-27 MED ORDER — FENTANYL CITRATE 0.05 MG/ML IJ SOLN
INTRAMUSCULAR | Status: AC
  Filled 2011-07-27: qty 4

## 2011-07-27 MED ORDER — MIDAZOLAM HCL 10 MG/2ML IJ SOLN
INTRAMUSCULAR | Status: AC
  Filled 2011-07-27: qty 4

## 2011-07-27 MED ORDER — EPINEPHRINE HCL 0.1 MG/ML IJ SOLN
INTRAMUSCULAR | Status: AC
  Filled 2011-07-27: qty 10

## 2011-08-01 ENCOUNTER — Encounter (HOSPITAL_COMMUNITY)
Admission: RE | Admit: 2011-08-01 | Discharge: 2011-08-01 | Disposition: A | Discharge status: Home / Self Care | Payer: Self-pay | Source: Ambulatory Visit | Attending: Internal Medicine | Admitting: Internal Medicine

## 2011-08-03 ENCOUNTER — Encounter: Payer: Self-pay | Admitting: Internal Medicine

## 2011-08-03 ENCOUNTER — Encounter (HOSPITAL_COMMUNITY)
Admission: RE | Admit: 2011-08-03 | Discharge: 2011-08-03 | Disposition: A | Discharge status: Home / Self Care | Payer: Self-pay | Source: Ambulatory Visit | Attending: Internal Medicine | Admitting: Internal Medicine

## 2011-08-04 ENCOUNTER — Encounter: Payer: Self-pay | Admitting: Internal Medicine

## 2011-08-04 ENCOUNTER — Ambulatory Visit (INDEPENDENT_AMBULATORY_CARE_PROVIDER_SITE_OTHER): Payer: Medicare Other | Admitting: Internal Medicine

## 2011-08-04 ENCOUNTER — Telehealth: Payer: Self-pay | Admitting: Internal Medicine

## 2011-08-04 VITALS — BP 140/60 | HR 99 | Temp 97.0°F | Ht 66.0 in | Wt 174.6 lb

## 2011-08-04 DIAGNOSIS — J449 Chronic obstructive pulmonary disease, unspecified: Secondary | ICD-10-CM

## 2011-08-04 NOTE — Progress Notes (Signed)
Subjective:    Patient ID: Melanie Best, female    DOB: 1945-05-21, 66 y.o.   MRN: [id number]  HPI IOV 08/04/2011  PCP is HUSAIN,KARRAR, MD Body mass index is 28.18 kg/(m^2).   reports that she quit smoking about 7 years ago. Her smoking use included Cigarettes. She has a 35 pack-year smoking history. She has never used smokeless tobacco.   66 year old female. Referred by Dr Eula Listen. 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 Diagnosed as copd 2004-2006. No longer smoking but smoked heavily before that.  Will get dyspneic walking > 100 feet. Gets dyspneic for changing clothes. Uses wheel chair for exertion > 100 feet. In past even in house was using power wheel chair but after pulmonary rehab in oct 2012 able to do somethings inside home wihtout wheel chair. Denis baseline cough or snoring but admits to easy day time somnolence. Feels faituged all the time. . Denies having had PFTs. Mdds include advair, spiriva, daily prednisone and duoneb  Current: since Jan 2013 rattling in chest but that is improved significantly. This was main reason for referral. They are giving hx of frequent MD visits and was taking a lot of mucinex that ultimately helped. Per hx CXR and ECHO within past mponth is normal. Now feels back to baseline but decided to keep up with appt anyways   COPD CAT symptom score which is her baseline reflects high symptom burden    CAT COPD Symptom and Quality of Life Score ([employer])  0 (no burden) to 5 (highest burden)  Never Cough -> Cough all the time 2  No phlegm in chest -> Chest is full of phlegm 3  No chest tightness -> Chest feels very tight 0  No dyspnea for 1 flight stairs/hill -> Very dyspneic for 1 flight of stairs 5  No limitations for ADL at home -> Very limited with ADL at home 4  Confident leaving home -> Not at all confident leaving home 3  Sleep  soundly -> Do not sleep soundly because of lung condition 4  Lots of Energy -> No energy at all 5  TOTAL Score (max 40)  26       CT chest august 2010 (personally reviewed and agree)  - Comparison: Chest x-ray dated 08/13/2008  CT CHEST  Findings: The patient has fairly severe emphysematous disease  particular in the upper lobes. There is no evidence of pulmonary  nodules, infiltrates, effusions, adenopathy, or other significant  abnormality. Heart size is normal. There is a tiny amount of  fluid in the pericardium. No bony abnormalities.  IMPRESSION:  COPD. Otherwise benign-appearing chest.    Past Medical History  Diagnosis Date  . Leukocytosis   . COPD (chronic obstructive pulmonary disease)   . Esophageal reflux   . Glaucoma   . Urinary incontinence   . Dyslipidemia   . Osteopenia   . Anxiety   . Tremor   . Syncope   . Diabetes mellitus, type 2   . DVT (deep venous thrombosis), left   . CKD (chronic kidney disease), stage II   . Anemia   . Vitamin d deficiency      No family history on file.   History   Social History  . Marital Status: Legally Separated    Spouse Name: N/A    Number of Children: N/A  . Years of Education:  N/A   Occupational History  . Not on file.   Social History Main Topics  . Smoking status: Former Smoker -- 1.0 packs/day for 35 years    Types: Cigarettes    Quit date: 03/20/2004  . Smokeless tobacco: Never Used  . Alcohol Use: No  . Drug Use: No  . Sexually Active: Not on file   Other Topics Concern  . Not on file   Social History Narrative  . No narrative on file     No Known Allergies   Outpatient Prescriptions Prior to Visit  Medication Sig Dispense Refill  . bimatoprost (LUMIGAN) 0.01 % SOLN Apply 1 drop to eye at bedtime.        . Calcium Carb-Cholecalciferol (CALCIUM 500 +D) 500-400 MG-UNIT TABS Take 2 tablets by mouth daily.      . clonazePAM (KLONOPIN) 1 MG tablet Take 1 mg by mouth 2 (two) times daily as  needed.        . diltiazem (DILACOR XR) 180 MG 24 hr capsule Take 180 mg by mouth daily.      . ergocalciferol (VITAMIN D2) 50000 UNITS capsule Take 50,000 Units by mouth once a week.        . esomeprazole (NEXIUM) 40 MG capsule Take 40 mg by mouth daily before breakfast.        . Fluticasone-Salmeterol (ADVAIR) 250-50 MCG/DOSE AEPB Inhale 1 puff into the lungs every 12 (twelve) hours.        . furosemide (LASIX) 20 MG tablet Take 20 mg by mouth daily.        Marland Kitchen HYDROcodone-acetaminophen (VICODIN) 5-500 MG per tablet Take 1 tablet by mouth every 6 (six) hours as needed.      . hydrOXYzine (ATARAX/VISTARIL) 25 MG tablet Take 25 mg by mouth 3 (three) times daily as needed.        Marland Kitchen ipratropium-albuterol (DUONEB) 0.5-2.5 (3) MG/3ML SOLN Take 3 mLs by nebulization 4 (four) times daily as needed.      . lovastatin (MEVACOR) 20 MG tablet Take 20 mg by mouth at bedtime.      . metFORMIN (GLUCOPHAGE) 500 MG tablet Take 500 mg by mouth daily with breakfast.        . predniSONE (DELTASONE) 10 MG tablet Take 10 mg by mouth daily.        . promethazine (PHENERGAN) 25 MG tablet Take 12.5 mg by mouth every 6 (six) hours as needed.      . tiotropium (SPIRIVA) 18 MCG inhalation capsule Place 18 mcg into inhaler and inhale daily.        Marland Kitchen topiramate (TOPAMAX) 25 MG capsule Take 25 mg by mouth 2 (two) times daily.              Review of Systems Review of Systems  Constitutional: Negative for fever and unexpected weight change.  HENT: Negative for ear pain, nosebleeds, congestion, sore throat, rhinorrhea, sneezing, trouble swallowing, dental problem, postnasal drip and sinus pressure.   Eyes: Negative for redness and itching.  Respiratory: Positive for cough and shortness of breath. Negative for chest tightness and wheezing.   Cardiovascular: Negative for palpitations and leg swelling.  Gastrointestinal: Negative for nausea and vomiting.  Genitourinary: Negative for dysuria.  Musculoskeletal: Negative for  joint swelling.  Skin: Negative for rash.  Neurological: Positive for headaches.  Hematological: Does not bruise/bleed easily.  Psychiatric/Behavioral: Negative for dysphoric mood. The patient is not nervous/anxious.       Objective:   Physical Exam  Vitals  reviewed. Constitutional: She is oriented to person, place, and time. She appears well-developed and well-nourished. No distress.        Obese deconidtoned Wheel chair   HENT:  Head: Normocephalic and atraumatic.  Right Ear: External ear normal.  Left Ear: External ear normal.  Mouth/Throat: Oropharynx is clear and moist. No oropharyngeal exudate.  Eyes: Conjunctivae and EOM are normal. Pupils are equal, round, and reactive to light. Right eye exhibits no discharge. Left eye exhibits no discharge. No scleral icterus.  Neck: Normal range of motion. Neck supple. No JVD present. No tracheal deviation present. No thyromegaly present.  Cardiovascular: Normal rate, regular rhythm, normal heart sounds and intact distal pulses.  Exam reveals no gallop and no friction rub.   No murmur heard. Pulmonary/Chest: Effort normal and breath sounds normal. No respiratory distress. She has no wheezes. She has no rales. She exhibits no tenderness.       Expiration prolonged No wheeze  Abdominal: Soft. Bowel sounds are normal. She exhibits no distension and no mass. There is no tenderness. There is no rebound and no guarding.  Musculoskeletal: Normal range of motion. She exhibits no edema and no tenderness.  Lymphadenopathy:    She has no cervical adenopathy.  Neurological: She is alert and oriented to person, place, and time. She has normal reflexes. No cranial nerve deficit. She exhibits normal muscle tone. Coordination normal.  Skin: Skin is warm and dry. No rash noted. She is not diaphoretic. No erythema. No pallor.  Psychiatric: Her behavior is normal. Judgment and thought content normal.       Slight flat affect         Assessment &  Plan:

## 2011-08-04 NOTE — Patient Instructions (Signed)
Please have full PFT breathing test I will call you with results to plan next step Nurse will give you some samples of spiriva and advair for you Continue oxygeen

## 2011-08-04 NOTE — Telephone Encounter (Signed)
[name]  Please get echo and cxr and laters note of D Hussain for this patient. I cannot tell Huntley Dec to do this  MR

## 2011-08-04 NOTE — Progress Notes (Deleted)
  Subjective:    Patient ID: Melanie Best, female    DOB: 1945/05/11, 66 y.o.   MRN: [id number]  HPI    Review of Systems  Constitutional: Negative for fever and unexpected weight change.  HENT: Negative for ear pain, nosebleeds, congestion, sore throat, rhinorrhea, sneezing, trouble swallowing, dental problem, postnasal drip and sinus pressure.   Eyes: Negative for redness and itching.  Respiratory: Positive for cough and shortness of breath. Negative for chest tightness and wheezing.   Cardiovascular: Negative for palpitations and leg swelling.  Gastrointestinal: Negative for nausea and vomiting.  Genitourinary: Negative for dysuria.  Musculoskeletal: Negative for joint swelling.  Skin: Negative for rash.  Neurological: Positive for headaches.  Hematological: Does not bruise/bleed easily.  Psychiatric/Behavioral: Negative for dysphoric mood. The patient is not nervous/anxious.        Objective:   Physical Exam        Assessment & Plan:

## 2011-08-05 ENCOUNTER — Encounter: Payer: Self-pay | Admitting: Internal Medicine

## 2011-08-05 DIAGNOSIS — J449 Chronic obstructive pulmonary disease, unspecified: Secondary | ICD-10-CM | POA: Insufficient documentation

## 2011-08-05 NOTE — Assessment & Plan Note (Signed)
Appears to have very severe copd with high CAT Score and o2 and prednisone dependent; all pointing to high risk copd status. Currently at baseline while taking spiriva, advair and prednisone and oxygen. I will get PFTs. I will get old records esp recent notes and echo report if any. These will help clarify prpgnosis. Based on this review will advise on coming or any adjustments to medications esp adding roflumilast to preventt recurent ae copd (note:she has already been to rehab)

## 2011-08-08 ENCOUNTER — Encounter (HOSPITAL_COMMUNITY)
Admission: RE | Admit: 2011-08-08 | Discharge: 2011-08-08 | Disposition: A | Discharge status: Home / Self Care | Payer: Self-pay | Source: Ambulatory Visit | Attending: Internal Medicine | Admitting: Internal Medicine

## 2011-08-10 ENCOUNTER — Encounter (HOSPITAL_COMMUNITY)
Admission: RE | Admit: 2011-08-10 | Discharge: 2011-08-10 | Disposition: A | Discharge status: Home / Self Care | Payer: Self-pay | Source: Ambulatory Visit | Attending: Internal Medicine | Admitting: Internal Medicine

## 2011-08-10 ENCOUNTER — Ambulatory Visit (INDEPENDENT_AMBULATORY_CARE_PROVIDER_SITE_OTHER): Payer: Medicare Other | Admitting: Internal Medicine

## 2011-08-10 DIAGNOSIS — J449 Chronic obstructive pulmonary disease, unspecified: Secondary | ICD-10-CM

## 2011-08-10 LAB — PULMONARY FUNCTION TEST

## 2011-08-10 NOTE — Progress Notes (Signed)
PFT done today. 

## 2011-08-10 NOTE — Telephone Encounter (Signed)
Echo results requested. OV note and cxr results in look-at. Carron Curie, CMA

## 2011-08-15 ENCOUNTER — Telehealth: Payer: Self-pay | Admitting: Internal Medicine

## 2011-08-15 ENCOUNTER — Encounter (HOSPITAL_COMMUNITY)
Admission: RE | Admit: 2011-08-15 | Discharge: 2011-08-15 | Disposition: A | Discharge status: Home / Self Care | Payer: Self-pay | Source: Ambulatory Visit | Attending: Internal Medicine | Admitting: Internal Medicine

## 2011-08-15 NOTE — Telephone Encounter (Signed)
April 2013 notes of Dr Eula Listen reviewed (some , not all)   - A Flutter suspected: Dr Mayford Knife saw patient and cleared it as sinus tach  - referral to me is due to recurrent ae copd and copd synmptoms despite maximal Rx

## 2011-08-15 NOTE — Telephone Encounter (Signed)
Please set up fu appt to discuss COPD mgmt after breathing test and furgther mgmt; test shows very severe copd   For my perusal    PFT 08/10/11 - fev1 0.8L/34%, Ratio 40, TLC 110%, DLCO 35%   echo 07/19/2004 normal

## 2011-08-16 NOTE — Telephone Encounter (Signed)
Pt scheduled for 08-25-11 at 2:45.Carron Curie, CMA

## 2011-08-17 ENCOUNTER — Encounter (HOSPITAL_COMMUNITY)
Admission: RE | Admit: 2011-08-17 | Discharge: 2011-08-17 | Disposition: A | Discharge status: Home / Self Care | Payer: Self-pay | Source: Ambulatory Visit | Attending: Internal Medicine | Admitting: Internal Medicine

## 2011-08-22 ENCOUNTER — Encounter: Payer: Self-pay | Admitting: Internal Medicine

## 2011-08-22 ENCOUNTER — Encounter (HOSPITAL_COMMUNITY)
Admission: RE | Admit: 2011-08-22 | Discharge: 2011-08-22 | Disposition: A | Discharge status: Home / Self Care | Payer: Self-pay | Source: Ambulatory Visit | Attending: Internal Medicine | Admitting: Internal Medicine

## 2011-08-22 DIAGNOSIS — K219 Gastro-esophageal reflux disease without esophagitis: Secondary | ICD-10-CM | POA: Insufficient documentation

## 2011-08-22 DIAGNOSIS — R0902 Hypoxemia: Secondary | ICD-10-CM | POA: Insufficient documentation

## 2011-08-22 DIAGNOSIS — J449 Chronic obstructive pulmonary disease, unspecified: Secondary | ICD-10-CM | POA: Insufficient documentation

## 2011-08-22 DIAGNOSIS — Z5189 Encounter for other specified aftercare: Secondary | ICD-10-CM | POA: Insufficient documentation

## 2011-08-22 DIAGNOSIS — J4489 Other specified chronic obstructive pulmonary disease: Secondary | ICD-10-CM | POA: Insufficient documentation

## 2011-08-24 ENCOUNTER — Encounter (HOSPITAL_COMMUNITY)
Admission: RE | Admit: 2011-08-24 | Discharge: 2011-08-24 | Disposition: A | Discharge status: Home / Self Care | Payer: Self-pay | Source: Ambulatory Visit | Attending: Internal Medicine | Admitting: Internal Medicine

## 2011-08-25 ENCOUNTER — Ambulatory Visit (INDEPENDENT_AMBULATORY_CARE_PROVIDER_SITE_OTHER): Payer: Medicare Other | Admitting: Internal Medicine

## 2011-08-25 ENCOUNTER — Encounter: Payer: Self-pay | Admitting: Internal Medicine

## 2011-08-25 VITALS — BP 110/64 | HR 112 | Temp 98.5°F | Ht 66.0 in | Wt 175.0 lb

## 2011-08-25 DIAGNOSIS — J449 Chronic obstructive pulmonary disease, unspecified: Secondary | ICD-10-CM

## 2011-08-25 NOTE — Patient Instructions (Signed)
You have very severe copd Continue oxygen and your medications Start roflumilast 1 tablet daily to prevent bronchitis attacks   - for first week take it one every other day then take daily  - always take it after food  - if you have side effects like nausea, vomit, or anything else call us immediately We will check alpha 1 genetic test for copd [facility]; please think about it Followup 2 months with my nurse [name] for medication calendar and followup

## 2011-08-25 NOTE — Progress Notes (Signed)
  Subjective:    Patient ID: Melanie Best, female    DOB: March 17, 1946, 66 y.o.   MRN: [id number]  HPI #COPD   - O2 dependent since 2004 (CT 2010 - emphysema, CXR - per hx clear in 2013)  - CAT score 29 - May 2013 (dyspnea, fatigue greatest symptom, not much cough. - Doe 100 feet and uses wheel chair for exertion beyond that)    - Gold stage 3-4, fev1 0.8L/34%, Ratio 40, TLC 110%, DLCO 35%  In May 2013   - Rx  Mdds include advair, spiriva, daily prednisone 10mg  and duoneb  - REhab since oct 2012   #Recurent AECOPD  - multiple in early 2013 - rx by PMD  #Ex-smoker  - 35 ppd. Quit 2007   OV 08/25/2011 Last seen May 2013 as first visit  Here to discuss results of PFT    -   PFT 08/10/11 - fev1 0.8L/34%, Ratio 40, TLC 110%, DLCO 35% - GOLD STAGE 4 copd   -  echo 07/19/2004 normal  - No interim complaints. Feels at baseline. CAT score is 18. She is attending rehab maintenance. She is not using wheelchair anymore. She is using walker.      Review of Systems  Constitutional: Negative for fever and unexpected weight change.  HENT: Negative for ear pain, nosebleeds, congestion, sore throat, rhinorrhea, sneezing, trouble swallowing, dental problem, postnasal drip and sinus pressure.   Eyes: Negative for redness and itching.  Respiratory: Positive for shortness of breath. Negative for cough, chest tightness and wheezing.   Cardiovascular: Negative for palpitations and leg swelling.  Gastrointestinal: Negative for nausea and vomiting.  Genitourinary: Negative for dysuria.  Musculoskeletal: Negative for joint swelling.  Skin: Negative for rash.  Neurological: Negative for headaches.  Hematological: Does not bruise/bleed easily.  Psychiatric/Behavioral: Negative for dysphoric mood. The patient is not nervous/anxious.        Objective:   Physical Exam Vitals reviewed. Discussion only visit Constitutional: She is oriented to person, place, and time. She appears well-developed and  well-nourished. No distress.        Obese deconidtoned Pulmonary/Chest: Effort normal and breath sounds normal. No respiratory distress. She has no wheezes. She has no rales. She exhibits no tenderness.       Expiration prolonged No wheeze  Psychiatric: Her behavior is normal. Judgment and thought content normal.       Slight flat affect         Assessment & Plan:

## 2011-08-25 NOTE — Assessment & Plan Note (Addendum)
You have very severe copd Continue oxygen and your medications Start roflumilast 1 tablet daily to prevent bronchitis attacks   - for first week take it one every other day then take daily  - always take it after food  - if you have side effects like nausea, vomit, or anything else call us immediately We will check alpha 1 genetic test for copd [facility] We discussed [facility] option at [facility]; please think about it Followup 2 months with my nurse [name] for medication calendar and followup Will do advance care planning at future visits Will need to investigate and see how we can taper them off prednisone   > 50% of this 15 min visit spent in face to face counseling

## 2011-08-29 ENCOUNTER — Encounter (HOSPITAL_COMMUNITY)
Admission: RE | Admit: 2011-08-29 | Discharge: 2011-08-29 | Disposition: A | Discharge status: Home / Self Care | Payer: Self-pay | Source: Ambulatory Visit | Attending: Internal Medicine | Admitting: Internal Medicine

## 2011-08-31 ENCOUNTER — Encounter (HOSPITAL_COMMUNITY)
Admission: RE | Admit: 2011-08-31 | Discharge: 2011-08-31 | Disposition: A | Discharge status: Home / Self Care | Payer: Self-pay | Source: Ambulatory Visit | Attending: Internal Medicine | Admitting: Internal Medicine

## 2011-09-05 ENCOUNTER — Encounter (HOSPITAL_COMMUNITY): Payer: Self-pay

## 2011-09-07 ENCOUNTER — Encounter (HOSPITAL_COMMUNITY)
Admission: RE | Admit: 2011-09-07 | Discharge: 2011-09-07 | Disposition: A | Discharge status: Home / Self Care | Payer: Self-pay | Source: Ambulatory Visit | Attending: Internal Medicine | Admitting: Internal Medicine

## 2011-09-12 ENCOUNTER — Encounter (HOSPITAL_COMMUNITY): Payer: Self-pay

## 2011-09-14 ENCOUNTER — Encounter (HOSPITAL_COMMUNITY)
Admission: RE | Admit: 2011-09-14 | Discharge: 2011-09-14 | Disposition: A | Discharge status: Home / Self Care | Payer: Self-pay | Source: Ambulatory Visit | Attending: Internal Medicine | Admitting: Internal Medicine

## 2011-09-19 ENCOUNTER — Encounter (HOSPITAL_COMMUNITY)
Admission: RE | Admit: 2011-09-19 | Discharge: 2011-09-19 | Disposition: A | Discharge status: Home / Self Care | Payer: Self-pay | Source: Ambulatory Visit | Attending: Internal Medicine | Admitting: Internal Medicine

## 2011-09-19 DIAGNOSIS — Z5189 Encounter for other specified aftercare: Secondary | ICD-10-CM | POA: Insufficient documentation

## 2011-09-19 DIAGNOSIS — J449 Chronic obstructive pulmonary disease, unspecified: Secondary | ICD-10-CM | POA: Insufficient documentation

## 2011-09-19 DIAGNOSIS — K219 Gastro-esophageal reflux disease without esophagitis: Secondary | ICD-10-CM | POA: Insufficient documentation

## 2011-09-19 DIAGNOSIS — J4489 Other specified chronic obstructive pulmonary disease: Secondary | ICD-10-CM | POA: Insufficient documentation

## 2011-09-19 DIAGNOSIS — R0902 Hypoxemia: Secondary | ICD-10-CM | POA: Insufficient documentation

## 2011-09-26 ENCOUNTER — Encounter (HOSPITAL_COMMUNITY)
Admission: RE | Admit: 2011-09-26 | Discharge: 2011-09-26 | Disposition: A | Discharge status: Home / Self Care | Payer: Self-pay | Source: Ambulatory Visit | Attending: Internal Medicine | Admitting: Internal Medicine

## 2011-09-28 ENCOUNTER — Telehealth: Payer: Self-pay | Admitting: Internal Medicine

## 2011-09-28 ENCOUNTER — Encounter (HOSPITAL_COMMUNITY)
Admission: RE | Admit: 2011-09-28 | Discharge: 2011-09-28 | Disposition: A | Discharge status: Home / Self Care | Payer: Self-pay | Source: Ambulatory Visit | Attending: Internal Medicine | Admitting: Internal Medicine

## 2011-09-28 NOTE — Telephone Encounter (Signed)
Alpha 1 done 08/25/11 is MM and normal

## 2011-10-03 ENCOUNTER — Encounter (HOSPITAL_COMMUNITY)
Admission: RE | Admit: 2011-10-03 | Discharge: 2011-10-03 | Disposition: A | Discharge status: Home / Self Care | Payer: Self-pay | Source: Ambulatory Visit | Attending: Internal Medicine | Admitting: Internal Medicine

## 2011-10-05 ENCOUNTER — Encounter (HOSPITAL_COMMUNITY)
Admission: RE | Admit: 2011-10-05 | Discharge: 2011-10-05 | Disposition: A | Discharge status: Home / Self Care | Payer: Self-pay | Source: Ambulatory Visit | Attending: Internal Medicine | Admitting: Internal Medicine

## 2011-10-10 ENCOUNTER — Encounter (HOSPITAL_COMMUNITY)
Admission: RE | Admit: 2011-10-10 | Discharge: 2011-10-10 | Disposition: A | Discharge status: Home / Self Care | Payer: Self-pay | Source: Ambulatory Visit | Attending: Internal Medicine | Admitting: Internal Medicine

## 2011-10-11 ENCOUNTER — Telehealth: Payer: Self-pay | Admitting: Pulmonary Disease

## 2011-10-11 NOTE — Telephone Encounter (Signed)
Dr. Vassie Loll can we use your RN slot for 10/13/11 at 12:00 to work pt in. Please advised thanks

## 2011-10-11 NOTE — Telephone Encounter (Signed)
Received call from Dr. Retta Diones.  She had f/u CT chest/abd after recent nephrectomy.  She was found to have new lung nodule.  She is followed by Dr. Marchelle Gearing for severe COPD, but he is not available to see her for several weeks.  Will have pt scheduled to be seen another provider in our office to expedite evaluation of new lung nodule.

## 2011-10-12 ENCOUNTER — Encounter: Payer: Self-pay | Admitting: Internal Medicine

## 2011-10-12 ENCOUNTER — Encounter (HOSPITAL_COMMUNITY): Payer: Self-pay

## 2011-10-12 NOTE — Telephone Encounter (Signed)
I spoke with pt and is scheduled to come in and see RA tomorrow at 12. Will forward to MR so he is aware of this

## 2011-10-12 NOTE — Telephone Encounter (Signed)
ok 

## 2011-10-12 NOTE — Telephone Encounter (Signed)
lmomtcb x1 at both #'s listed 

## 2011-10-13 ENCOUNTER — Encounter: Payer: Self-pay | Admitting: Pulmonary Disease

## 2011-10-13 ENCOUNTER — Ambulatory Visit (INDEPENDENT_AMBULATORY_CARE_PROVIDER_SITE_OTHER): Payer: Medicare Other | Admitting: Pulmonary Disease

## 2011-10-13 VITALS — BP 114/68 | HR 101 | Temp 98.0°F | Ht 66.0 in | Wt 172.0 lb

## 2011-10-13 DIAGNOSIS — J449 Chronic obstructive pulmonary disease, unspecified: Secondary | ICD-10-CM

## 2011-10-13 DIAGNOSIS — R911 Solitary pulmonary nodule: Secondary | ICD-10-CM | POA: Insufficient documentation

## 2011-10-13 NOTE — Progress Notes (Signed)
  Subjective:    Patient ID: Melanie Best, female    DOB: [birth year], 66 y.o.   MRN: [id number]  HPI #COPD  - O2 dependent since 2004 (CT 2010 - emphysema, CXR - per hx clear in 2013)  - CAT score 29 - May 2013 (dyspnea, fatigue greatest symptom, not much cough. - Doe 100 feet and uses wheel chair for exertion beyond that)  - Gold stage 3-4, fev1 0.8L/34%, Ratio 40, TLC 110%, DLCO 35% In May 2013  - Rx Mdds include advair, spiriva, daily prednisone 10mg  and duoneb  - REhab since oct 2012  #Recurent AECOPD  - multiple in early 2013 - rx by PMD  #Ex-smoker  - 35 ppd. Quit 2007  OV 08/25/2011  Last seen May 2013 as first visit  Here to discuss results of PFT  - PFT 08/10/11 - fev1 0.8L/34%, Ratio 40, TLC 110%, DLCO 35% - GOLD STAGE 4 copd  - echo 07/19/2004 normal  - No interim complaints. Feels at baseline. CAT score is 18. She is attending rehab maintenance. She is not using wheelchair anymore. She is using walker.    10/13/2011 Received call from Dr. Retta Diones. She had f/u CT chest/abd after recent nephrectomy. She was found to have new lung nodule - not seen on CT 7/12 Slight interval enlargement of a 1.3 cm intermediate - high attenuation lesion in the left kidney  She is followed by Dr. Marchelle Gearing for severe COPD, but he is not available to see her for several weeks. She stopped daliresp due to severe nausea inspite of taking qod.    Review of Systems neg for any significant sore throat, dysphagia, itching, sneezing, nasal congestion or excess/ purulent secretions, fever, chills, sweats, unintended wt loss, pleuritic or exertional cp, hempoptysis, orthopnea pnd or change in chronic leg swelling. Also denies presyncope, palpitations, heartburn, abdominal pain, nausea, vomiting, diarrhea or change in bowel or urinary habits, dysuria,hematuria, rash, arthralgias, visual complaints, headache, numbness weakness or ataxia.     Objective:   Physical Exam  Gen. Pleasant, well-nourished, in  no distress, normal affect, with O2 & walker ENT - no lesions, no post nasal drip, steroid facies Neck: No JVD, no thyromegaly, no carotid bruits Lungs: no use of accessory muscles, no dullness to percussion, decreased  without rales or rhonchi  Cardiovascular: Rhythm regular, heart sounds  normal, no murmurs or gallops, no peripheral edema Abdomen: soft and non-tender, no hepatosplenomegaly, BS normal. Musculoskeletal: No deformities, no cyanosis or clubbing Neuro:  alert, non focal        Assessment & Plan:

## 2011-10-13 NOTE — Telephone Encounter (Signed)
Thanks. Ok with RA seein. Suspect CT chest at [facility] urolog

## 2011-10-13 NOTE — Patient Instructions (Signed)
You have a new lung nodule , not seen on CT scan in 2012 Schedule a PET scan Your options include - do nothing OR do a biopsy (with risks that we discussed) in the hope that radiation would help

## 2011-10-13 NOTE — Assessment & Plan Note (Signed)
OK to stop daliresp - side effects Ct spiriva + SABA Consider symbicort addition to this regimen Attempt to taper off prednisone ? Defer to dr Marchelle Gearing

## 2011-10-13 NOTE — Assessment & Plan Note (Signed)
Favor primary lung rather than metastatic renal based on appearance - discussed in detail with review of images with pt & husband. With her poor lung function, she is not a candidate even for a wedge resection. Biopsy with ENB carries risk of general anesthesia & CT guided needle bx almost certainly will cause a pneumothorax. Radiation may be an option if biopsy is pursued. Otherwise , observation if she does not want to assume the risk of biopsy. Al these options were discussed in detail. A PET scan will be scheduled -

## 2011-10-17 ENCOUNTER — Encounter (HOSPITAL_COMMUNITY)
Admission: RE | Admit: 2011-10-17 | Discharge: 2011-10-17 | Disposition: A | Discharge status: Home / Self Care | Payer: Self-pay | Source: Ambulatory Visit | Attending: Internal Medicine | Admitting: Internal Medicine

## 2011-10-18 ENCOUNTER — Encounter (HOSPITAL_COMMUNITY)
Admission: RE | Admit: 2011-10-18 | Discharge: 2011-10-18 | Disposition: A | Discharge status: Home / Self Care | Payer: Medicare Other | Source: Ambulatory Visit | Attending: Pulmonary Disease | Admitting: Pulmonary Disease

## 2011-10-18 ENCOUNTER — Encounter (HOSPITAL_COMMUNITY): Payer: Self-pay

## 2011-10-18 DIAGNOSIS — J438 Other emphysema: Secondary | ICD-10-CM | POA: Insufficient documentation

## 2011-10-18 DIAGNOSIS — K7689 Other specified diseases of liver: Secondary | ICD-10-CM | POA: Insufficient documentation

## 2011-10-18 DIAGNOSIS — R911 Solitary pulmonary nodule: Secondary | ICD-10-CM | POA: Insufficient documentation

## 2011-10-18 DIAGNOSIS — Z85528 Personal history of other malignant neoplasm of kidney: Secondary | ICD-10-CM | POA: Insufficient documentation

## 2011-10-18 DIAGNOSIS — Z905 Acquired absence of kidney: Secondary | ICD-10-CM | POA: Insufficient documentation

## 2011-10-18 DIAGNOSIS — N289 Disorder of kidney and ureter, unspecified: Secondary | ICD-10-CM | POA: Insufficient documentation

## 2011-10-18 DIAGNOSIS — G319 Degenerative disease of nervous system, unspecified: Secondary | ICD-10-CM | POA: Insufficient documentation

## 2011-10-18 LAB — GLUCOSE, CAPILLARY: Glucose-Capillary: 147 mg/dL — ABNORMAL HIGH (ref 70–99)

## 2011-10-18 MED ORDER — FLUDEOXYGLUCOSE F - 18 (FDG) INJECTION
18.7000 | Freq: Once | INTRAVENOUS | Status: AC | PRN
  Administered 2011-10-18: 18.7 via INTRAVENOUS

## 2011-10-19 ENCOUNTER — Encounter (HOSPITAL_COMMUNITY)
Admission: RE | Admit: 2011-10-19 | Discharge: 2011-10-19 | Disposition: A | Discharge status: Home / Self Care | Payer: Self-pay | Source: Ambulatory Visit | Attending: Internal Medicine | Admitting: Internal Medicine

## 2011-10-19 DIAGNOSIS — J449 Chronic obstructive pulmonary disease, unspecified: Secondary | ICD-10-CM | POA: Insufficient documentation

## 2011-10-19 DIAGNOSIS — R0902 Hypoxemia: Secondary | ICD-10-CM | POA: Insufficient documentation

## 2011-10-19 DIAGNOSIS — K219 Gastro-esophageal reflux disease without esophagitis: Secondary | ICD-10-CM | POA: Insufficient documentation

## 2011-10-19 DIAGNOSIS — J4489 Other specified chronic obstructive pulmonary disease: Secondary | ICD-10-CM | POA: Insufficient documentation

## 2011-10-19 DIAGNOSIS — Z5189 Encounter for other specified aftercare: Secondary | ICD-10-CM | POA: Insufficient documentation

## 2011-10-24 ENCOUNTER — Encounter (HOSPITAL_COMMUNITY)
Admission: RE | Admit: 2011-10-24 | Discharge: 2011-10-24 | Disposition: A | Discharge status: Home / Self Care | Payer: Self-pay | Source: Ambulatory Visit | Attending: Internal Medicine | Admitting: Internal Medicine

## 2011-10-26 ENCOUNTER — Encounter: Payer: Medicare Other | Admitting: Adult Health

## 2011-10-26 ENCOUNTER — Encounter (HOSPITAL_COMMUNITY)
Admission: RE | Admit: 2011-10-26 | Discharge: 2011-10-26 | Disposition: A | Discharge status: Home / Self Care | Payer: Self-pay | Source: Ambulatory Visit | Attending: Internal Medicine | Admitting: Internal Medicine

## 2011-10-31 ENCOUNTER — Encounter (HOSPITAL_COMMUNITY)
Admission: RE | Admit: 2011-10-31 | Discharge: 2011-10-31 | Disposition: A | Discharge status: Home / Self Care | Payer: Self-pay | Source: Ambulatory Visit | Attending: Internal Medicine | Admitting: Internal Medicine

## 2011-11-02 ENCOUNTER — Encounter (HOSPITAL_COMMUNITY)
Admission: RE | Admit: 2011-11-02 | Discharge: 2011-11-02 | Disposition: A | Discharge status: Home / Self Care | Payer: Self-pay | Source: Ambulatory Visit | Attending: Internal Medicine | Admitting: Internal Medicine

## 2011-11-05 ENCOUNTER — Telehealth: Payer: Self-pay | Admitting: Internal Medicine

## 2011-11-05 NOTE — Telephone Encounter (Signed)
Dr Vassie Loll did PET scan and nodule lit up.  This was 10/18/11. Followup and discussion left to me. SHe has appt to see TP 11/06/11 for med calendar but I need to be able to discuss nodule. How soon can we get her in ?  Thanks  MR  PS ; cc sent to TP. Wondering comfort level discussing this nodule decision making ?

## 2011-11-06 ENCOUNTER — Ambulatory Visit (INDEPENDENT_AMBULATORY_CARE_PROVIDER_SITE_OTHER): Payer: Medicare Other | Admitting: Internal Medicine

## 2011-11-06 ENCOUNTER — Encounter: Payer: Medicare Other | Admitting: Adult Health

## 2011-11-06 VITALS — BP 106/58 | HR 112 | Temp 97.0°F | Ht 66.0 in | Wt 174.8 lb

## 2011-11-06 DIAGNOSIS — R911 Solitary pulmonary nodule: Secondary | ICD-10-CM

## 2011-11-06 DIAGNOSIS — J449 Chronic obstructive pulmonary disease, unspecified: Secondary | ICD-10-CM

## 2011-11-06 NOTE — Telephone Encounter (Signed)
Pt will see TP for med calender and then see MR later for discussion about results.Melanie Best, CMA

## 2011-11-06 NOTE — Patient Instructions (Addendum)
You need bronchoscopy with biopsy to find out what this nodule is from Please wait to hear from [place]

## 2011-11-06 NOTE — Progress Notes (Signed)
Subjective:    Patient ID: Melanie Best, female    DOB: 1945-04-01, 66 y.o.   MRN: [id number]  HPI  #COPD  - O2 dependent since 2004 (CT 2010 - emphysema, CXR - per hx clear in 2013)  - CAT score 29 - May 2013 (dyspnea, fatigue greatest symptom, not much cough. - Doe 100 feet and uses wheel chair for exertion beyond that)  - Gold stage 3-4, fev1 0.8L/34%, Ratio 40, TLC 110%, DLCO 35% In May 2013  - Rx Mdds include advair, spiriva, daily prednisone 10mg  and duoneb  - REhab since oct 2012   #Recurent AECOPD  - multiple in early 2013 - rx by PMD   #Ex-smoker  - 35 ppd. Quit 2007    OV 08/25/2011  Last seen May 2013 as first visit  Here to discuss results of PFT  - PFT 08/10/11 - fev1 0.8L/34%, Ratio 40, TLC 110%, DLCO 35% - GOLD STAGE 4 copd  - echo 07/19/2004 normal  - No interim complaints. Feels at baseline. CAT score is 18. She is attending rehab maintenance. She is not using wheelchair anymore. She is using walker.   OV 11/06/2011 Reports that in Oct/Nov 2010  had s/p right nephrectomy for renal cell cancer discovered following MRI for leg/back pain. Also, s/p laminectomy following that in 2011 for "pinched nerve" by Dr Annell Greening. States q6-12 months has surveillance thoracic and abd/pelvis CT with most recent ones being  July 2012 in our system In July 2012, nodule was NOT seen in right lung nor is it reported on CXR  Jan 2013, and April 2013 (though possibly seen in retrospect RUL April 2013) . In July 2013, had surveillance CT showed 2.5cm spiculared RUL nodule. d PET scan 10/18/11 where Spiculated hypermetabolic right upper lobe lung nodule. This  measures 2.5 x 1.5 cm and a S.U.V. max of 6.1 on image 77. Here to discuss PET results. Note: she is fixated on the idea that she wants her lungs transplanted    PET scan 10/18/11  IMPRESSION:  1. Hypermetabolic right upper lobe spiculated nodule, most  consistent with primary bronchogenic carcinoma. Presuming non-  small cell  histology, by imaging T1bN0M0 or stage IA.  2. No evidence of metastatic disease from renal cell carcinoma.  3. Hyperattenuating tiny left renal lesion is most likely a  hemorrhagic cyst but can be reevaluated on follow-up.  4. Hepatic steatosis.  Original Report Authenticated By: Consuello Bossier, M.D.     Past Medical History  Diagnosis Date  . Leukocytosis   . COPD (chronic obstructive pulmonary disease)   . Esophageal reflux   . Glaucoma   . Urinary incontinence   . Dyslipidemia   . Osteopenia   . Anxiety   . Tremor   . Syncope   . Diabetes mellitus, type 2   . DVT (deep venous thrombosis), left   . CKD (chronic kidney disease), stage II   . Anemia   . Vitamin d deficiency      No family history on file.   History   Social History  . Marital Status: Legally Separated    Spouse Name: N/A    Number of Children: N/A  . Years of Education: N/A   Occupational History  . Not on file.   Social History Main Topics  . Smoking status: Former Smoker -- 1.0 packs/day for 35 years    Types: Cigarettes    Quit date: 03/20/2004  . Smokeless tobacco: Never Used  . Alcohol  Use: No  . Drug Use: No  . Sexually Active: Not on file   Other Topics Concern  . Not on file   Social History Narrative  . No narrative on file     No Known Allergies   Outpatient Prescriptions Prior to Visit  Medication Sig Dispense Refill  . albuterol (PROVENTIL HFA;VENTOLIN HFA) 108 (90 BASE) MCG/ACT inhaler Inhale 2 puffs into the lungs every 4 (four) hours as needed.      . bimatoprost (LUMIGAN) 0.01 % SOLN Apply 1 drop to eye at bedtime.        . brinzolamide (AZOPT) 1 % ophthalmic suspension Place 1 drop into the right eye 3 (three) times daily.      . Calcium Carb-Cholecalciferol (CALCIUM 500 +D) 500-400 MG-UNIT TABS Take 2 tablets by mouth daily.      . clonazePAM (KLONOPIN) 1 MG tablet Take 1 mg by mouth 2 (two) times daily as needed.        . diltiazem (DILACOR XR) 180 MG 24 hr  capsule Take 180 mg by mouth daily.      . ergocalciferol (VITAMIN D2) 50000 UNITS capsule Take 50,000 Units by mouth once a week.        . esomeprazole (NEXIUM) 40 MG capsule Take 40 mg by mouth daily before breakfast.        . HYDROcodone-acetaminophen (VICODIN) 5-500 MG per tablet Take 1 tablet by mouth every 6 (six) hours as needed.      . hydrOXYzine (ATARAX/VISTARIL) 25 MG tablet Take 25 mg by mouth 3 (three) times daily as needed.        Marland Kitchen ipratropium-albuterol (DUONEB) 0.5-2.5 (3) MG/3ML SOLN Take 3 mLs by nebulization 4 (four) times daily as needed.      . lovastatin (MEVACOR) 20 MG tablet Take 20 mg by mouth at bedtime.      . metFORMIN (GLUCOPHAGE) 500 MG tablet Take 500 mg by mouth daily with breakfast.        . predniSONE (DELTASONE) 10 MG tablet Take 10 mg by mouth daily.        . promethazine (PHENERGAN) 25 MG tablet Take 12.5 mg by mouth every 6 (six) hours as needed.      . tiotropium (SPIRIVA) 18 MCG inhalation capsule Place 18 mcg into inhaler and inhale daily.            Review of Systems  Constitutional: Negative for fever, chills, diaphoresis, activity change, appetite change, fatigue and unexpected weight change.  HENT: Negative for nosebleeds, congestion, sore throat, rhinorrhea, sneezing, mouth sores, trouble swallowing, voice change, postnasal drip, sinus pressure and tinnitus.   Eyes: Negative for photophobia, discharge, itching and visual disturbance.  Respiratory: Negative for cough, choking, chest tightness, shortness of breath and wheezing.   Cardiovascular: Negative for chest pain and palpitations.  Gastrointestinal: Negative for nausea, vomiting, abdominal pain and constipation.  Genitourinary: Negative for difficulty urinating.  Musculoskeletal: Negative for myalgias, back pain and gait problem.  Skin: Negative for rash.  Neurological: Negative for dizziness, tremors, seizures, speech difficulty, weakness, light-headedness, numbness and headaches.    Hematological: Does not bruise/bleed easily.  Psychiatric/Behavioral: Negative for confusion, disturbed wake/sleep cycle and agitation. The patient is not nervous/anxious.        Objective:   Physical Exam    itals reviewed. Discussion only visit Constitutional: She is oriented to person, place, and time. She appears well-developed and well-nourished. No distress.        Obese deconidtoned Pulmonary/Chest: Effort normal and  breath sounds normal. No respiratory distress. She has no wheezes. She has no rales. She exhibits no tenderness.       Expiration prolonged No wheeze  Psychiatric: Her behavior is normal. Judgment and thought content normal.       Slight flat affect     Assessment & Plan:

## 2011-11-07 ENCOUNTER — Encounter (HOSPITAL_COMMUNITY)
Admission: RE | Admit: 2011-11-07 | Discharge: 2011-11-07 | Disposition: A | Discharge status: Home / Self Care | Payer: Self-pay | Source: Ambulatory Visit | Attending: Internal Medicine | Admitting: Internal Medicine

## 2011-11-09 ENCOUNTER — Encounter (HOSPITAL_COMMUNITY)
Admission: RE | Admit: 2011-11-09 | Discharge: 2011-11-09 | Disposition: A | Discharge status: Home / Self Care | Payer: Self-pay | Source: Ambulatory Visit | Attending: Internal Medicine | Admitting: Internal Medicine

## 2011-11-12 ENCOUNTER — Telehealth: Payer: Self-pay | Admitting: Internal Medicine

## 2011-11-12 NOTE — Assessment & Plan Note (Signed)
Stable disease   Plan  continue o2, rehb and medications

## 2011-11-12 NOTE — Assessment & Plan Note (Signed)
JJuly 2013 - 2.5 cm new RUL nodule compared to July 2013. PET HOT. CLinically high suspicision for NSCLC T1, NO, MO. Poor lung function precludes surgery. Curative XRT is an option. So, will set up ENB. Needs super d ct first. Will set that up. She is fixated that lung cancer can be cured via lung transplant. Explained about transplant and waiting times and that cancer likely and exclusion. She has agreed to undergo bronch.  Risks of pneumothorax, hemothorax, sedation/anesthesia complications such as cardiac or respiratory arrest or hypotension, stroke and bleeding all explained. Benefits of diagnosis but limitations of non-diagnosis also explained. Patient verbalized understanding and wished to proceed.    > 50% of this > 25 min visit spent in face to face counseling (15 min visit converted to 25 min)

## 2011-11-12 NOTE — Telephone Encounter (Signed)
[name]  I am out week of 8/26. So pplease ensure Melanie Best is coordinating this ladys super D cT and ENB date with Dr Georges Lynch and Dr Earney Mallet  Thanks  MR

## 2011-11-14 ENCOUNTER — Encounter (HOSPITAL_COMMUNITY)
Admission: RE | Admit: 2011-11-14 | Discharge: 2011-11-14 | Disposition: A | Discharge status: Home / Self Care | Payer: Self-pay | Source: Ambulatory Visit | Attending: Internal Medicine | Admitting: Internal Medicine

## 2011-11-14 NOTE — Telephone Encounter (Signed)
Almyra Free is working on this order.Carron Curie, CMA

## 2011-11-15 ENCOUNTER — Ambulatory Visit (INDEPENDENT_AMBULATORY_CARE_PROVIDER_SITE_OTHER)
Admission: RE | Admit: 2011-11-15 | Discharge: 2011-11-15 | Disposition: A | Discharge status: Home / Self Care | Payer: Medicare Other | Source: Ambulatory Visit | Attending: Internal Medicine | Admitting: Internal Medicine

## 2011-11-15 DIAGNOSIS — R911 Solitary pulmonary nodule: Secondary | ICD-10-CM

## 2011-11-15 DIAGNOSIS — J449 Chronic obstructive pulmonary disease, unspecified: Secondary | ICD-10-CM

## 2011-11-16 ENCOUNTER — Encounter (HOSPITAL_COMMUNITY)
Admission: RE | Admit: 2011-11-16 | Discharge: 2011-11-16 | Disposition: A | Discharge status: Home / Self Care | Payer: Self-pay | Source: Ambulatory Visit | Attending: Neurology | Admitting: Neurology

## 2011-11-21 ENCOUNTER — Encounter (HOSPITAL_COMMUNITY): Payer: Self-pay | Admitting: Respiratory Therapy

## 2011-11-21 ENCOUNTER — Encounter (HOSPITAL_COMMUNITY)
Admission: RE | Admit: 2011-11-21 | Discharge: 2011-11-21 | Disposition: A | Discharge status: Home / Self Care | Payer: Self-pay | Source: Ambulatory Visit | Attending: Internal Medicine | Admitting: Internal Medicine

## 2011-11-21 DIAGNOSIS — K219 Gastro-esophageal reflux disease without esophagitis: Secondary | ICD-10-CM | POA: Insufficient documentation

## 2011-11-21 DIAGNOSIS — J4489 Other specified chronic obstructive pulmonary disease: Secondary | ICD-10-CM | POA: Insufficient documentation

## 2011-11-21 DIAGNOSIS — R0902 Hypoxemia: Secondary | ICD-10-CM | POA: Insufficient documentation

## 2011-11-21 DIAGNOSIS — Z5189 Encounter for other specified aftercare: Secondary | ICD-10-CM | POA: Insufficient documentation

## 2011-11-21 DIAGNOSIS — J449 Chronic obstructive pulmonary disease, unspecified: Secondary | ICD-10-CM | POA: Insufficient documentation

## 2011-11-22 ENCOUNTER — Ambulatory Visit: Payer: Medicare Other | Admitting: Pulmonary Disease

## 2011-11-23 ENCOUNTER — Encounter (HOSPITAL_COMMUNITY)
Admission: RE | Admit: 2011-11-23 | Discharge: 2011-11-23 | Disposition: A | Discharge status: Home / Self Care | Payer: Self-pay | Source: Ambulatory Visit | Attending: Internal Medicine | Admitting: Internal Medicine

## 2011-11-24 ENCOUNTER — Other Ambulatory Visit: Payer: Self-pay | Admitting: Emergency Medicine

## 2011-11-24 ENCOUNTER — Telehealth: Payer: Self-pay | Admitting: Emergency Medicine

## 2011-11-24 NOTE — Telephone Encounter (Signed)
Done

## 2011-11-27 ENCOUNTER — Encounter (HOSPITAL_COMMUNITY): Payer: Self-pay

## 2011-11-27 ENCOUNTER — Ambulatory Visit (HOSPITAL_COMMUNITY)
Admission: RE | Admit: 2011-11-27 | Discharge: 2011-11-27 | Disposition: A | Discharge status: Home / Self Care | Payer: Medicare Other | Source: Ambulatory Visit | Attending: Emergency Medicine | Admitting: Emergency Medicine

## 2011-11-27 ENCOUNTER — Encounter (HOSPITAL_COMMUNITY)
Admission: RE | Admit: 2011-11-27 | Discharge: 2011-11-27 | Disposition: A | Discharge status: Home / Self Care | Payer: Medicare Other | Source: Ambulatory Visit | Attending: Emergency Medicine | Admitting: Emergency Medicine

## 2011-11-27 DIAGNOSIS — J4489 Other specified chronic obstructive pulmonary disease: Secondary | ICD-10-CM | POA: Insufficient documentation

## 2011-11-27 DIAGNOSIS — J449 Chronic obstructive pulmonary disease, unspecified: Secondary | ICD-10-CM | POA: Insufficient documentation

## 2011-11-27 DIAGNOSIS — Z01818 Encounter for other preprocedural examination: Secondary | ICD-10-CM | POA: Insufficient documentation

## 2011-11-27 DIAGNOSIS — R911 Solitary pulmonary nodule: Secondary | ICD-10-CM | POA: Insufficient documentation

## 2011-11-27 DIAGNOSIS — Z01812 Encounter for preprocedural laboratory examination: Secondary | ICD-10-CM | POA: Insufficient documentation

## 2011-11-27 HISTORY — DX: Essential (primary) hypertension: I10

## 2011-11-27 HISTORY — DX: Malignant (primary) neoplasm, unspecified: C80.1

## 2011-11-27 HISTORY — DX: Shortness of breath: R06.02

## 2011-11-27 HISTORY — DX: Unspecified osteoarthritis, unspecified site: M19.90

## 2011-11-27 LAB — PROTIME-INR
INR: 0.96 (ref 0.00–1.49)
Prothrombin Time: 13 seconds (ref 11.6–15.2)

## 2011-11-27 LAB — CBC
HCT: 41.1 % (ref 36.0–46.0)
Hemoglobin: 12.7 g/dL (ref 12.0–15.0)
MCV: 101 fL — ABNORMAL HIGH (ref 78.0–100.0)
RDW: 14 % (ref 11.5–15.5)
WBC: 16.6 10*3/uL — ABNORMAL HIGH (ref 4.0–10.5)

## 2011-11-27 LAB — COMPREHENSIVE METABOLIC PANEL
Albumin: 3.9 g/dL (ref 3.5–5.2)
Alkaline Phosphatase: 83 U/L (ref 39–117)
BUN: 24 mg/dL — ABNORMAL HIGH (ref 6–23)
Chloride: 99 mEq/L (ref 96–112)
Creatinine, Ser: 1.28 mg/dL — ABNORMAL HIGH (ref 0.50–1.10)
GFR calc Af Amer: 50 mL/min — ABNORMAL LOW (ref >90)
GFR calc non Af Amer: 43 mL/min — ABNORMAL LOW (ref >90)
Glucose, Bld: 147 mg/dL — ABNORMAL HIGH (ref 70–99)
Potassium: 4.4 mEq/L (ref 3.5–5.1)
Total Bilirubin: 0.2 mg/dL — ABNORMAL LOW (ref 0.3–1.2)

## 2011-11-27 NOTE — Progress Notes (Signed)
Pt. Uses O2 continuously,  Followed by Dr. Marchelle Gearing, call to A. Maureen Chatters, reported the same.

## 2011-11-27 NOTE — Pre-Procedure Instructions (Addendum)
20 Melanie Best  11/27/2011   Your procedure is scheduled on:  11/29/2011  Report to [facility] at 6:30 AM.  Call this number if you have problems the morning of surgery: [phone]   Remember:   Do not eat food or drink liquids :After Midnight.-  TUESDAY    Take these medicines  the morning of surgery with A SIP OF WATER: prednisone, spiriva, nebulizer ,nexium, diltiazem, clonazepam, Azopt eye drop, can take pain medicine if needed       Do not wear jewelry, make-up or nail polish.  Do not wear lotions, powders, or perfumes. You may wear deodorant.  Do not shave 48 hours prior to surgery. Men may shave face and neck.  Do not bring valuables to the hospital.  Contacts, dentures or bridgework may not be worn into surgery.  Leave suitcase in the car. After surgery it may be brought to your room.  For patients admitted to the hospital, checkout time is 11:00 AM the day of discharge.   Patients discharged the day of surgery will not be allowed to drive home.  Name and phone number of your driver: /w family  Special Instructions: CHG Shower Use Special Wash: 1/2 bottle night before surgery and 1/2 bottle morning of surgery.   Please read over the following fact sheets that you were given: Pain Booklet, Coughing and Deep Breathing and Surgical Site Infection Prevention

## 2011-11-28 ENCOUNTER — Encounter (HOSPITAL_COMMUNITY)
Admission: RE | Admit: 2011-11-28 | Discharge: 2011-11-28 | Disposition: A | Discharge status: Home / Self Care | Payer: Self-pay | Source: Ambulatory Visit | Attending: Internal Medicine | Admitting: Internal Medicine

## 2011-11-28 NOTE — Consult Note (Signed)
Anesthesia Chart Review:  Patient is a 66 year old female scheduled for video bronchoscopy with electromagnetic navigation and biopsies for  RUL mss on 11/29/11 by Dr. Delton Coombes.  History includes former smoker, glaucoma, osteopenia, tremor, DM2, HTN, COPD with continuous supplemental O2, dyslipidemia, anxiety, LLE DVT '11, stage II CKD with history of right nephrectomy for renal cancer '10, arthritis, reflux, history syncope '06, laminectomy '11.  Primary Pulmonologist is Dr. Marchelle Gearing.  See by Dr. Vassie Loll on 10/24/11. According to his notes, PFT 08/10/11 - fev1 0.8L/34%, Ratio 40, TLC 110%, DLCO 35% - GOLD STAGE 4 copd.  Surveillance CT for renal cancer recently showed a right upper lung nodule.  Based on the appearance on CT, a primary lung cancer is favored over metastatic renal cancer.  She is not felt to be a surgical candidate for wedge resection or lobectomy due to her poor lung function.  Radiation therapy is being considered and ENB recommended for a definitive diagnosis.  CXR on 11/27/11 showed Changes related to COPD. Right upper lobe nodule.   EKG on 11/27/11 showed ST @ 114, possible LAE. She had a normal 2D echo on 07/19/04.  Labs noted.  WBC 16.6.  (Medication list includes prednisone.) Patient was afebrile at her PAT visit.  Cr 1.28.   If not acute change in her pulmonary status, then anticipate she can proceed as planned.  Shonna Chock, PA-C

## 2011-11-29 ENCOUNTER — Ambulatory Visit (HOSPITAL_COMMUNITY): Payer: Medicare Other | Admitting: Vascular Surgery

## 2011-11-29 ENCOUNTER — Encounter (HOSPITAL_COMMUNITY): Payer: Self-pay | Admitting: Vascular Surgery

## 2011-11-29 ENCOUNTER — Ambulatory Visit (HOSPITAL_COMMUNITY)
Admission: RE | Admit: 2011-11-29 | Discharge: 2011-11-29 | Disposition: A | Discharge status: Home / Self Care | Payer: Medicare Other | Source: Ambulatory Visit | Attending: Emergency Medicine | Admitting: Emergency Medicine

## 2011-11-29 ENCOUNTER — Encounter (HOSPITAL_COMMUNITY)
Admission: RE | Disposition: A | Discharge status: Home / Self Care | Payer: Self-pay | Source: Ambulatory Visit | Attending: Emergency Medicine

## 2011-11-29 ENCOUNTER — Ambulatory Visit (HOSPITAL_COMMUNITY): Payer: Medicare Other

## 2011-11-29 ENCOUNTER — Encounter (HOSPITAL_COMMUNITY): Payer: Self-pay | Admitting: Emergency Medicine

## 2011-11-29 DIAGNOSIS — J438 Other emphysema: Secondary | ICD-10-CM | POA: Insufficient documentation

## 2011-11-29 DIAGNOSIS — K219 Gastro-esophageal reflux disease without esophagitis: Secondary | ICD-10-CM | POA: Insufficient documentation

## 2011-11-29 DIAGNOSIS — E119 Type 2 diabetes mellitus without complications: Secondary | ICD-10-CM | POA: Insufficient documentation

## 2011-11-29 DIAGNOSIS — N182 Chronic kidney disease, stage 2 (mild): Secondary | ICD-10-CM | POA: Insufficient documentation

## 2011-11-29 DIAGNOSIS — E785 Hyperlipidemia, unspecified: Secondary | ICD-10-CM | POA: Insufficient documentation

## 2011-11-29 DIAGNOSIS — Z86718 Personal history of other venous thrombosis and embolism: Secondary | ICD-10-CM | POA: Insufficient documentation

## 2011-11-29 DIAGNOSIS — F411 Generalized anxiety disorder: Secondary | ICD-10-CM | POA: Insufficient documentation

## 2011-11-29 DIAGNOSIS — Z905 Acquired absence of kidney: Secondary | ICD-10-CM | POA: Insufficient documentation

## 2011-11-29 DIAGNOSIS — R911 Solitary pulmonary nodule: Secondary | ICD-10-CM | POA: Insufficient documentation

## 2011-11-29 DIAGNOSIS — Z8553 Personal history of malignant neoplasm of renal pelvis: Secondary | ICD-10-CM | POA: Insufficient documentation

## 2011-11-29 HISTORY — PX: FLEXIBLE BRONCHOSCOPY: SUR164

## 2011-11-29 LAB — GLUCOSE, CAPILLARY: Glucose-Capillary: 136 mg/dL — ABNORMAL HIGH (ref 70–99)

## 2011-11-29 SURGERY — VIDEO BRONCHOSCOPY WITH ENDOBRONCHIAL NAVIGATION
Anesthesia: General | Site: Chest | Laterality: Right | Wound class: Clean Contaminated

## 2011-11-29 MED ORDER — LACTATED RINGERS IV SOLN
INTRAVENOUS | Status: DC | PRN
  Administered 2011-11-29: 08:00:00 via INTRAVENOUS

## 2011-11-29 MED ORDER — PHENYLEPHRINE HCL 10 MG/ML IJ SOLN
INTRAMUSCULAR | Status: DC | PRN
  Administered 2011-11-29: 80 ug via INTRAVENOUS
  Administered 2011-11-29: 120 ug via INTRAVENOUS

## 2011-11-29 MED ORDER — GLYCOPYRROLATE 0.2 MG/ML IJ SOLN
INTRAMUSCULAR | Status: DC | PRN
  Administered 2011-11-29: .6 mg via INTRAVENOUS

## 2011-11-29 MED ORDER — OXYCODONE HCL 5 MG PO TABS: 5.0000 mg | ORAL_TABLET | Freq: Once | ORAL | Status: DC | PRN

## 2011-11-29 MED ORDER — SUFENTANIL CITRATE 50 MCG/ML IV SOLN
INTRAVENOUS | Status: DC | PRN
  Administered 2011-11-29 (×2): 10 ug via INTRAVENOUS

## 2011-11-29 MED ORDER — LIDOCAINE HCL 4 % MT SOLN
OROMUCOSAL | Status: DC | PRN
  Administered 2011-11-29: 4 mL via TOPICAL

## 2011-11-29 MED ORDER — NEOSTIGMINE METHYLSULFATE 1 MG/ML IJ SOLN
INTRAMUSCULAR | Status: DC | PRN
  Administered 2011-11-29: 4 mg via INTRAVENOUS

## 2011-11-29 MED ORDER — PROPOFOL 10 MG/ML IV BOLUS
INTRAVENOUS | Status: DC | PRN
  Administered 2011-11-29: 150 mg via INTRAVENOUS

## 2011-11-29 MED ORDER — DROPERIDOL 2.5 MG/ML IJ SOLN: 0.6250 mg | INTRAMUSCULAR | Status: DC | PRN

## 2011-11-29 MED ORDER — HYDROMORPHONE HCL PF 1 MG/ML IJ SOLN: 0.2500 mg | INTRAMUSCULAR | Status: DC | PRN

## 2011-11-29 MED ORDER — OXYCODONE HCL 5 MG/5ML PO SOLN: 5.0000 mg | Freq: Once | ORAL | Status: DC | PRN

## 2011-11-29 MED ORDER — ROCURONIUM BROMIDE 100 MG/10ML IV SOLN
INTRAVENOUS | Status: DC | PRN
  Administered 2011-11-29: 30 mg via INTRAVENOUS

## 2011-11-29 MED ORDER — 0.9 % SODIUM CHLORIDE (POUR BTL) OPTIME
TOPICAL | Status: DC | PRN
  Administered 2011-11-29: 1000 mL

## 2011-11-29 SURGICAL SUPPLY — 31 items
BRUSH SUPERTRAX BIOPSY (INSTRUMENTS) ×1 IMPLANT
BRUSH SUPERTRAX NDL-TIP CYTO (INSTRUMENTS) ×1 IMPLANT
CANISTER SUCTION 2500CC (MISCELLANEOUS) ×2 IMPLANT
CHANNEL WORK EXTEND EDGE 180 (KITS) IMPLANT
CHANNEL WORK EXTEND EDGE 45 (KITS) IMPLANT
CHANNEL WORK EXTEND EDGE 90 (KITS) IMPLANT
CLOTH BEACON ORANGE TIMEOUT ST (SAFETY) ×2 IMPLANT
CONT SPEC 4OZ CLIKSEAL STRL BL (MISCELLANEOUS) ×3 IMPLANT
COVER TABLE BACK 60X90 (DRAPES) ×2 IMPLANT
FILTER STRAW FLUID ASPIR (MISCELLANEOUS) IMPLANT
FORCEPS BIOP SUPERTRX PREMAR (INSTRUMENTS) ×1 IMPLANT
GLOVE BIO SURGEON STRL SZ7 (GLOVE) ×5 IMPLANT
GLOVE SURG SS PI 7.0 STRL IVOR (GLOVE) ×1 IMPLANT
KIT LOCATABLE GUIDE (CANNULA) IMPLANT
KIT MARKER FIDUCIAL DELIVERY (KITS) IMPLANT
KIT PROCEDURE EDGE 180 (KITS) ×1 IMPLANT
KIT PROCEDURE EDGE 45 (KITS) IMPLANT
KIT PROCEDURE EDGE 90 (KITS) IMPLANT
KIT ROOM TURNOVER OR (KITS) ×2 IMPLANT
MARKER SKIN DUAL TIP RULER LAB (MISCELLANEOUS) ×2 IMPLANT
NDL SUPERTRX PREMARK BIOPSY (NEEDLE) IMPLANT
NEEDLE SUPERTRX PREMARK BIOPSY (NEEDLE) ×2 IMPLANT
NS IRRIG 1000ML POUR BTL (IV SOLUTION) ×2 IMPLANT
OIL SILICONE PENTAX (PARTS (SERVICE/REPAIRS)) ×2 IMPLANT
PAD ARMBOARD 7.5X6 YLW CONV (MISCELLANEOUS) ×4 IMPLANT
PATCHES PATIENT (LABEL) ×2 IMPLANT
SPONGE GAUZE 4X4 12PLY (GAUZE/BANDAGES/DRESSINGS) ×2 IMPLANT
SYR 20ML ECCENTRIC (SYRINGE) ×2 IMPLANT
TOWEL OR 17X24 6PK STRL BLUE (TOWEL DISPOSABLE) ×2 IMPLANT
TRAP SPECIMEN MUCOUS 40CC (MISCELLANEOUS) ×2 IMPLANT
TUBE CONNECTING 12X1/4 (SUCTIONS) ×2 IMPLANT

## 2011-11-29 NOTE — Anesthesia Preprocedure Evaluation (Addendum)
Anesthesia Evaluation  Patient identified by MRN, date of birth, ID band Patient awake    Reviewed: Allergy & Precautions, H&P , NPO status , Patient's Chart, lab work & pertinent test results  Airway Mallampati: I TM Distance: >3 FB Neck ROM: Full    Dental  (+) Edentulous Upper, Edentulous Lower and Dental Advisory Given   Pulmonary shortness of breath, COPD COPD inhaler,  breath sounds clear to auscultation  Pulmonary exam normal       Cardiovascular hypertension, Pt. on medications Rhythm:Regular Rate:Normal     Neuro/Psych Anxiety negative neurological ROS     GI/Hepatic Neg liver ROS, GERD-  ,  Endo/Other  diabetes, Oral Hypoglycemic Agents  Renal/GU Renal InsufficiencyRenal disease     Musculoskeletal   Abdominal   Peds  Hematology   Anesthesia Other Findings   Reproductive/Obstetrics                           Anesthesia Physical Anesthesia Plan  ASA: III  Anesthesia Plan: General   Post-op Pain Management:    Induction: Intravenous  Airway Management Planned: Oral ETT  Additional Equipment:   Intra-op Plan:   Post-operative Plan: Extubation in OR  Informed Consent: I have reviewed the patients History and Physical, chart, labs and discussed the procedure including the risks, benefits and alternatives for the proposed anesthesia with the patient or authorized representative who has indicated his/her understanding and acceptance.   Dental advisory given  Plan Discussed with: CRNA, Anesthesiologist and Surgeon  Anesthesia Plan Comments:         Anesthesia Quick Evaluation

## 2011-11-29 NOTE — Transfer of Care (Signed)
Immediate Anesthesia Transfer of Care Note  Patient: Melanie Best  Procedure(s) Performed: Procedure(s) (LRB) with comments: VIDEO BRONCHOSCOPY WITH ENDOBRONCHIAL NAVIGATION (Right)  Patient Location: PACU  Anesthesia Type: General  Level of Consciousness: awake, alert , oriented and patient cooperative  Airway & Oxygen Therapy: Patient Spontanous Breathing and Patient connected to face mask oxygen  Post-op Assessment: Report given to PACU RN and Post -op Vital signs reviewed and stable  Post vital signs: Reviewed and stable  Complications: No apparent anesthesia complications

## 2011-11-29 NOTE — Anesthesia Postprocedure Evaluation (Signed)
Anesthesia Post Note  Patient: Melanie Best  Procedure(s) Performed: Procedure(s) (LRB): VIDEO BRONCHOSCOPY WITH ENDOBRONCHIAL NAVIGATION (Right)  Anesthesia type: general  Patient location: PACU  Post pain: Pain level controlled  Post assessment: Patient's Cardiovascular Status Stable  Last Vitals:  Filed Vitals:   11/29/11 1006  BP:   Pulse: 86  Temp:   Resp: 15    Post vital signs: Reviewed and stable  Level of consciousness: sedated  Complications: No apparent anesthesia complications

## 2011-11-29 NOTE — Anesthesia Procedure Notes (Signed)
Procedure Name: Intubation Date/Time: 11/29/2011 7:39 AM Performed by: Glendora Score A Pre-anesthesia Checklist: Patient identified, Emergency Drugs available, Suction available and Patient being monitored Patient Re-evaluated:Patient Re-evaluated prior to inductionOxygen Delivery Method: Circle system utilized Preoxygenation: Pre-oxygenation with 100% oxygen Intubation Type: IV induction Ventilation: Mask ventilation without difficulty Laryngoscope Size: Miller and 2 Grade View: Grade I Tube type: Oral Tube size: 8.5 mm Number of attempts: 1 Airway Equipment and Method: Stylet and LTA kit utilized Placement Confirmation: ETT inserted through vocal cords under direct vision,  positive ETCO2 and breath sounds checked- equal and bilateral Secured at: 21 cm Tube secured with: Tape Dental Injury: Teeth and Oropharynx as per pre-operative assessment

## 2011-11-29 NOTE — Preoperative (Signed)
Beta Blockers   Reason not to administer Beta Blockers:Not Applicable 

## 2011-11-29 NOTE — Progress Notes (Signed)
cbg  136  

## 2011-11-29 NOTE — H&P (Signed)
HPI      #COPD  - O2 dependent since 2004 (CT 2010 - emphysema, CXR - per hx clear in 2013)  - CAT score 29 - May 2013 (dyspnea, fatigue greatest symptom, not much cough. - Doe 100 feet and uses wheel chair for exertion beyond that)  - Gold stage 3-4, fev1 0.8L/34%, Ratio 40, TLC 110%, DLCO 35% In May 2013  - Rx Mdds include advair, spiriva, daily prednisone 10mg  and duoneb  - REhab since oct 2012  #Recurent AECOPD  - multiple in early 2013 - rx by PMD  #Ex-smoker  - 35 ppd. Quit 2007  OV 08/25/2011  Last seen May 2013 as first visit  Here to discuss results of PFT  - PFT 08/10/11 - fev1 0.8L/34%, Ratio 40, TLC 110%, DLCO 35% - GOLD STAGE 4 copd  - echo 07/19/2004 normal  - No interim complaints. Feels at baseline. CAT score is 18. She is attending rehab maintenance. She is not using wheelchair anymore. She is using walker.  OV 11/06/2011  Reports that in Oct/Nov 2010 had s/p right nephrectomy for renal cell cancer discovered following MRI for leg/back pain. Also, s/p laminectomy following that in 2011 for "pinched nerve" by Dr Annell Greening. States q6-12 months has surveillance thoracic and abd/pelvis CT with most recent ones being July 2012 in our system In July 2012, nodule was NOT seen in right lung nor is it reported on CXR Jan 2013, and April 2013 (though possibly seen in retrospect RUL April 2013) . In July 2013, had surveillance CT showed 2.5cm spiculared RUL nodule. d PET scan 10/18/11 where Spiculated hypermetabolic right upper lobe lung nodule. This measures 2.5 x 1.5 cm and a S.U.V. max of 6.1 on image 77. Here to discuss PET results. Note: she is fixated on the idea that she wants her lungs transplanted  PET scan 10/18/11   Pre-op 11/29/11:  Reviewed the hx with the patient. No new issues or findings. She has RUL nodule, suspicious for malignancy. We will proceed with FOB + ENB and bx's.   IMPRESSION:  1. Hypermetabolic right upper lobe spiculated nodule, most  consistent with  primary bronchogenic carcinoma. Presuming non-  small cell histology, by imaging T1bN0M0 or stage IA.  2. No evidence of metastatic disease from renal cell carcinoma.  3. Hyperattenuating tiny left renal lesion is most likely a  hemorrhagic cyst but can be reevaluated on follow-up.  4. Hepatic steatosis.  Original Report Authenticated By: Consuello Bossier, M.D.  Past Medical History   Diagnosis  Date   .  Leukocytosis    .  COPD (chronic obstructive pulmonary disease)    .  Esophageal reflux    .  Glaucoma    .  Urinary incontinence    .  Dyslipidemia    .  Osteopenia    .  Anxiety    .  Tremor    .  Syncope    .  Diabetes mellitus, type 2    .  DVT (deep venous thrombosis), left    .  CKD (chronic kidney disease), stage II    .  Anemia    .  Vitamin d deficiency     No family history on file.  History    Social History   .  Marital Status:  Legally Separated     Spouse Name:  N/A     Number of Children:  N/A   .  Years of Education:  N/A    Occupational  History   .  Not on file.    Social History Main Topics   .  Smoking status:  Former Smoker -- 1.0 packs/day for 35 years     Types:  Cigarettes     Quit date:  03/20/2004   .  Smokeless tobacco:  Never Used   .  Alcohol Use:  No   .  Drug Use:  No   .  Sexually Active:  Not on file    Other Topics  Concern   .  Not on file    Social History Narrative   .  No narrative on file    No Known Allergies   Filed Vitals:   11/29/11 0712  BP: 127/78  Pulse: 94  Temp: 97.8 F (36.6 C)  Resp: 22   Gen: Pleasant, obese, in no distress,  normal affect  ENT: Edentulous, no lesions,  mouth clear,  oropharynx clear, no postnasal drip  Neck: No JVD, no TMG, no carotid bruits  Lungs: No use of accessory muscles, distant, no wheeze  Cardiovascular: RRR, heart sounds normal, no murmur or gallops, no peripheral edema  Abdomen: soft and NT, no HSM,  BS normal  Musculoskeletal: No deformities, no cyanosis or  clubbing  Neuro: alert, non focal  Skin: Warm, no lesions or rashes   A/P: RUL mass/nodule in pt with hx renal cell CA. We will proceed with FOB + ENB and bx's. Dr Marin Shutter and Dr Delton Coombes to perform the procedure.   Levy Pupa, MD, PhD 11/29/2011, 8:28 AM [facility] [phone] or if no answer [phone]

## 2011-11-30 ENCOUNTER — Encounter (HOSPITAL_COMMUNITY): Admission: RE | Admit: 2011-11-30 | Payer: Self-pay | Source: Ambulatory Visit

## 2011-12-01 LAB — CULTURE, RESPIRATORY W GRAM STAIN: Gram Stain: NONE SEEN

## 2011-12-05 ENCOUNTER — Encounter (HOSPITAL_COMMUNITY)
Admission: RE | Admit: 2011-12-05 | Discharge: 2011-12-05 | Disposition: A | Discharge status: Home / Self Care | Payer: Self-pay | Source: Ambulatory Visit | Attending: Internal Medicine | Admitting: Internal Medicine

## 2011-12-07 ENCOUNTER — Telehealth: Payer: Self-pay | Admitting: Internal Medicine

## 2011-12-07 ENCOUNTER — Encounter (HOSPITAL_COMMUNITY)
Admission: RE | Admit: 2011-12-07 | Discharge: 2011-12-07 | Disposition: A | Discharge status: Home / Self Care | Payer: Self-pay | Source: Ambulatory Visit | Attending: Internal Medicine | Admitting: Internal Medicine

## 2011-12-07 NOTE — Telephone Encounter (Signed)
Ran into her 12/07/2011 at rehab; she is wondering about her bx results from 11/29/11 which was non-diagnostic. Pleaes have her come in tomorrow 12/08/11 to discuss these results and implications. You do not have to tell her is non-diagnostic or positive or negative. Just tell her to show up Over book if needed. Last appt of the day please

## 2011-12-07 NOTE — Telephone Encounter (Signed)
Appt set for 12-08-11 at 4pm.Hoa Deriso Holstein, CMA

## 2011-12-08 ENCOUNTER — Ambulatory Visit (INDEPENDENT_AMBULATORY_CARE_PROVIDER_SITE_OTHER): Payer: Medicare Other | Admitting: Internal Medicine

## 2011-12-08 ENCOUNTER — Encounter: Payer: Self-pay | Admitting: Internal Medicine

## 2011-12-08 ENCOUNTER — Other Ambulatory Visit: Payer: Self-pay | Admitting: Internal Medicine

## 2011-12-08 ENCOUNTER — Ambulatory Visit: Payer: Medicare Other | Admitting: Internal Medicine

## 2011-12-08 VITALS — BP 128/68 | HR 106 | Temp 97.3°F | Ht 66.0 in | Wt 179.0 lb

## 2011-12-08 DIAGNOSIS — Z1231 Encounter for screening mammogram for malignant neoplasm of breast: Secondary | ICD-10-CM

## 2011-12-08 DIAGNOSIS — J449 Chronic obstructive pulmonary disease, unspecified: Secondary | ICD-10-CM

## 2011-12-08 DIAGNOSIS — R911 Solitary pulmonary nodule: Secondary | ICD-10-CM

## 2011-12-08 NOTE — Patient Instructions (Addendum)
The results of the bronch are non-diagnostic This means that while we are not 100% certain that you have early lung cancer the pre-biopsy odds that this is very likely lung cancer is unchanged I will contact [facility] lung transplant team about transplant option Meanwhile, I will make referral to our radiation oncologist about radiation Rx for this nodule in case we are not able to do transplant Stay in touch

## 2011-12-08 NOTE — Progress Notes (Signed)
Subjective:    Patient ID: Melanie Best, female    DOB: [birth year], 66 y.o.   MRN: [id number]  HPI  #COPD  - O2 dependent since 2004 (CT 2010 - emphysema, CXR - per hx clear in 2013)  - CAT score 29 - May 2013 (dyspnea, fatigue greatest symptom, not much cough. - Doe 100 feet and uses wheel chair for exertion beyond that)  - Gold stage 3-4, fev1 0.8L/34%, Ratio 40, TLC 110%, DLCO 35% In May 2013  - Rx Mdds include advair, spiriva, daily prednisone 10mg  and duoneb  - REhab since oct 2012   #Recurent AECOPD  - multiple in early 2013 - rx by PMD   #Ex-smoker  - 35 ppd. Quit 2007    OV 08/25/2011  Last seen May 2013 as first visit  Here to discuss results of PFT  - PFT 08/10/11 - fev1 0.8L/34%, Ratio 40, TLC 110%, DLCO 35% - GOLD STAGE 3-4 copd  - echo 07/19/2004 normal 33 - No interim complaints. Feels at baseline. CAT score is 18. She is attending rehab maintenance. She is not using wheelchair anymore. She is using walker.   OV 11/06/2011 Reports that in Oct/Nov 2010  had s/p right nephrectomy for renal cell cancer discovered following MRI for leg/back pain. Also, s/p laminectomy following that in 2011 for "pinched nerve" by Dr Annell Greening. States q6-12 months has surveillance thoracic and abd/pelvis CT with most recent ones being  July 2012 in our system In July 2012, nodule was NOT seen in right lung nor is it reported on CXR  Jan 2013, and April 2013 (though possibly seen in retrospect RUL April 2013) . In July 2013, had surveillance CT showed 2.5cm spiculared RUL nodule. d PET scan 10/18/11 where Spiculated hypermetabolic right upper lobe lung nodule. This  measures 2.5 x 1.5 cm and a S.U.V. max of 6.1 on image 77. Here to discuss PET results. Note: she is fixated on the idea that she wants her lungs transplanted  PET scan 10/18/11  IMPRESSION:  1. Hypermetabolic right upper lobe spiculated nodule, most  consistent with primary bronchogenic carcinoma. Presuming non-  small cell  histology, by imaging T1bN0M0 or stage IA.  2. No evidence of metastatic disease from renal cell carcinoma.  3. Hyperattenuating tiny left renal lesion is most likely a  hemorrhagic cyst but can be reevaluated on follow-up.  4. Hepatic steatosis.  Original Report Authenticated By: Consuello Bossier, M.D.  CT ches 11/15/11 IMPRESSION:  1. Stable spiculated right upper lobe lung mass consistent with  neoplasm.  2. 5 x 4 mm adjacent nodule and 4 mm lower lobe pulmonary nodule  require surveillance.  3. No mediastinal or hilar lymphadenopathy.  CXR 11/29/11 - nodule present unchanged  ENB 11/29/11: NON Diagnostic - normal resp cells  OV 12/08/2011  Here to follow ENB results. No new issues. Discussion only visit. Been attending rehab. Very keen on transplant questions   Past Medical History  Diagnosis Date  . Leukocytosis   . Glaucoma   . Urinary incontinence   . Dyslipidemia   . Osteopenia   . Anxiety   . Tremor   . Syncope   . Diabetes mellitus, type 2   . DVT (deep venous thrombosis), left     2011- treated /w coumadin   . Anemia   . Vitamin d deficiency   . Shortness of breath   . CKD (chronic kidney disease), stage II     removed R kidney- 2010- for Cancer,  followed by Dr. Retta Diones  . Cancer     kidney  . Arthritis     R leg  . Hypertension     sees Dr. Eula Listen for PCP, denies ever having stress or ECHO, ?when & where she had  last  EKG  . Esophageal reflux   . COPD (chronic obstructive pulmonary disease)     uses O2-2 liters , continuously , followed by Dr. Marchelle Gearing     No family history on file.   History   Social History  . Marital Status: Legally Separated    Spouse Name: N/A    Number of Children: N/A  . Years of Education: N/A   Occupational History  . Not on file.   Social History Main Topics  . Smoking status: Former Smoker -- 1.0 packs/day for 35 years    Types: Cigarettes    Quit date: 03/20/2004  . Smokeless tobacco: Never Used  . Alcohol  Use: No  . Drug Use: No  . Sexually Active: Not on file   Other Topics Concern  . Not on file   Social History Narrative  . No narrative on file     No Known Allergies   Outpatient Prescriptions Prior to Visit  Medication Sig Dispense Refill  . albuterol (PROVENTIL HFA;VENTOLIN HFA) 108 (90 BASE) MCG/ACT inhaler Inhale 2 puffs into the lungs every 4 (four) hours as needed. For shortness of breath      . bimatoprost (LUMIGAN) 0.01 % SOLN Place 1 drop into both eyes at bedtime.       . brinzolamide (AZOPT) 1 % ophthalmic suspension Place 1 drop into the right eye 3 (three) times daily.      . Calcium Carb-Cholecalciferol (CALCIUM 500 +D) 500-400 MG-UNIT TABS Take 2 tablets by mouth 2 times daily at 12 noon and 4 pm.       . clonazePAM (KLONOPIN) 1 MG tablet Take 1 mg by mouth 2 (two) times daily as needed. For anxiety      . ergocalciferol (VITAMIN D2) 50000 UNITS capsule Take 50,000 Units by mouth once a week. Take on Monday      . esomeprazole (NEXIUM) 40 MG capsule Take 40 mg by mouth daily before breakfast.       . furosemide (LASIX) 20 MG tablet Take 20 mg by mouth daily as needed.      Marland Kitchen HYDROcodone-acetaminophen (VICODIN) 5-500 MG per tablet Take 1 tablet by mouth every 6 (six) hours as needed. For pain      . hydrOXYzine (ATARAX/VISTARIL) 25 MG tablet Take 25 mg by mouth 3 (three) times daily as needed. For anxiety      . ipratropium-albuterol (DUONEB) 0.5-2.5 (3) MG/3ML SOLN Take 3 mLs by nebulization 4 (four) times daily as needed. For shortness of breath      . lovastatin (MEVACOR) 20 MG tablet Take 20 mg by mouth at bedtime.      . metFORMIN (GLUCOPHAGE) 500 MG tablet Take 500 mg by mouth daily with breakfast.       . predniSONE (DELTASONE) 10 MG tablet Take 10 mg by mouth daily.       . promethazine (PHENERGAN) 25 MG tablet Take 12.5 mg by mouth every 6 (six) hours as needed. For nausea      . tiotropium (SPIRIVA) 18 MCG inhalation capsule Place 18 mcg into inhaler and inhale  daily with breakfast.       . diltiazem (DILACOR XR) 180 MG 24 hr capsule Take 180 mg by  mouth daily before breakfast.           Review of Systems  Constitutional: Negative for fever and unexpected weight change.  HENT: Negative for ear pain, nosebleeds, congestion, sore throat, rhinorrhea, sneezing, trouble swallowing, dental problem, postnasal drip and sinus pressure.   Eyes: Negative for redness and itching.  Respiratory: Positive for shortness of breath and wheezing. Negative for cough and chest tightness.   Cardiovascular: Positive for leg swelling. Negative for palpitations.  Gastrointestinal: Negative for nausea and vomiting.  Genitourinary: Negative for dysuria.  Musculoskeletal: Negative for joint swelling.  Skin: Negative for rash.  Neurological: Negative for headaches.  Hematological: Does not bruise/bleed easily.  Psychiatric/Behavioral: Negative for dysphoric mood. The patient is not nervous/anxious.        Objective:   Physical Exam  Has general tremor and head shake Looks baseline Pleasant Here with husband      Assessment & Plan:

## 2011-12-09 NOTE — Op Note (Signed)
Name:  RASHI GRANIER MRN:  [id number] DOB:  Dec 23, 1945  OPERATIVE NOTE  Procedure(s): Flexible bronchoscopy [phone]) Electromagnetic navigational bronchoscopy [phone]) Brushing (82956) of the right upper lobe nodule Bronchioalveolar lavage (21308) of the right upper lobe Transbronchial lung biopsy, single lobe (65784) of the right upper lobe Transbronchial needle aspiration (69629) of the right upper lobe nodule  Indications:  Pulmonary nodule  Consent:  Procedure, benefits, risks and alternatives discussed.  Questions answered.  Consent obtained.  Anesthesia:  General endotracheal.  Procedure summary:    Prior to the procedure a high-resolution chest CT scan was performed. Utilizing superDimension software a virtual tracheobronchial tree was generated to allow the creation of distinct navigation pathways to the parenchymal abnormality.   Appropriate equipment was assembled.  The patient was brought to the operating room and identified as Chelsea Primus.  Safety timeout was performed. The patient was placed supine on the operating table, airway established and general anesthesia administered by Anesthesia team.   After the appropriate level of anesthesia was assured, flexible video bronchoscope was lubricated and inserted through the endotracheal tube.  Airway examination was performed bilaterally to subsegmental level.  Minimal clear secretions were noted, mucosa appeared normal and no endobronchial lesions were identified.  Next, the extended working channel and locatable guide were introduced into the bronchoscope. Navigation pathways prepared prior to the procedure were then utilized to navigate to the target lesion. The extended working channel was secured into place and the locatable guide was withdrawn. Under fluoroscopic guidance transbronchial brushings, needle aspirations, forceps biopsies and targeted bronchoalveolar lavage of the lesion were performed.  After hemostasis was  assure, the bronchoscope was withdrawn.  The patient was extubated in operating room and transferred to PACU. Post-procedure chest x-ray was ordered.  Specimens sent:  Transbronchial brushings, needle aspirations, forceps biopsies and targeted bronchoalveolar lavage of the right upper lobe nodule samples for lab assessment.  Complications:  No immediate complications were noted.  Hemodynamic parameters and oxygenation remained stable throughout the procedure.  Estimated blood loss:  Less then 5 mL.  Orlean Bradford, M.D. Pulmonary and Critical Care Medicine [facility] Cell: [phone]  12/09/2011, 5:30 PM

## 2011-12-10 ENCOUNTER — Telehealth: Payer: Self-pay | Admitting: Internal Medicine

## 2011-12-10 NOTE — Telephone Encounter (Signed)
Hi STacy  She has deep seated 2CM RUL nodule. Bronch bx is non diagnostic. PET HOT and pre-test prob for cancer is high. I am not sure this can be biopsied but if you wish for bx through CT guided TTNA method let me know. I have asked my staff to set up appt with you  PAtient interested in lung transplant -f ev1 0.8L/34% with dlco 35%. We have made a referral to [facility] as well for transplant question  Thanks  MR

## 2011-12-10 NOTE — Assessment & Plan Note (Signed)
July 2013:  2.5 cm new RUL nodule compared to July 2012 PET HOT + No change on CT 11/15/11 PErsistent on CXR 11/29/11 Non-diagnostic ENG 11/29/11   PLAN  - the nodule appears too deep seated for safe CT guided TTNA. The pre-test probabillity for NSCLC stage 1A is very very high. FEv1 0.8L/34% with DLCO 35% makes her a non-surgical candidate. Her option is empiric SBRT. I will refer her to Dr Michell Heinrich. Perhaps Dr Michell Heinrich might want CT guided TTNA biopsy.  However, she is desiring lung transplant. I am not sure about the logistics of this. Certainly a transplant could cure her from lung cancer. Uncertainties in my mind regarding transplant are a) DO not know if transplant can be expedited; b) if copd severe enough for transplant; c) if medical problems preclude transplant.  We  Will also make a referral to [facility] transplant program  She and husband agree with plan  > 50% of this > 25 min visit spent in face to face counseling (15 min visit converted to 25 min)

## 2011-12-12 ENCOUNTER — Encounter (HOSPITAL_COMMUNITY)
Admission: RE | Admit: 2011-12-12 | Discharge: 2011-12-12 | Disposition: A | Discharge status: Home / Self Care | Payer: Self-pay | Source: Ambulatory Visit | Attending: Internal Medicine | Admitting: Internal Medicine

## 2011-12-13 NOTE — Telephone Encounter (Signed)
Thanks STacy  Do you want to discuss her at [facility] tomorrow morning ? If so, I can try to do a last minute add on  I heard from [facility] transplatn: she is not a candidate. They rejected her candidacy   So, XRT the way ahead  Thanks  MR

## 2011-12-13 NOTE — Telephone Encounter (Signed)
Up to you.  Everyone freaks out if there are late add ons.  I won't be there tomorrow morning but if you are going to be there, you can see what everyone thinks about a biopsy.  I'm seeing her 10/3 so you could add her on for that conference as well.   SW

## 2011-12-13 NOTE — Telephone Encounter (Signed)
I would prefer a biopsy if at all possible.  Not sure what the ramifications of a collapsed lung would be.  We can definitely treat with low complications.   Will she need to have this treated before she will be considered for transplant?   Thanks for the referral! SW

## 2011-12-14 ENCOUNTER — Encounter (HOSPITAL_COMMUNITY)
Admission: RE | Admit: 2011-12-14 | Discharge: 2011-12-14 | Disposition: A | Discharge status: Home / Self Care | Payer: Self-pay | Source: Ambulatory Visit | Attending: Internal Medicine | Admitting: Internal Medicine

## 2011-12-14 ENCOUNTER — Telehealth: Payer: Self-pay | Admitting: Internal Medicine

## 2011-12-14 DIAGNOSIS — R911 Solitary pulmonary nodule: Secondary | ICD-10-CM

## 2011-12-14 NOTE — Telephone Encounter (Signed)
I went through that with her over the phone. She should get a letter from them but basically they think transplant willnot beneft her on account of her medicla problems esp prior kidney cancer, her medications, her tremors, and fact she has only one kidney lefft. It usally means that trasnplant medicatins willdo serious harm in her

## 2011-12-14 NOTE — Telephone Encounter (Signed)
disussed with patient   `1. Advised her not a transplant candidate  2. INformed her that when she meets Dr Michell Heinrich 12/21/11 to discuss empirix XRT v CT guided TTNA (deemed high risk per Dr Lowella Dandy and Dr Bonnielee Haff as relayed to me by Dr Charline Bills)  3. Has new LLL nodule - needs fu  Please giver her fu with me in 3 months with CT chest with contrast to evaluate LLL nodule (ordered but will need bmet check prior to ct)   THanks  MR

## 2011-12-14 NOTE — Telephone Encounter (Signed)
Patient calling back.  Asking for info about being declined for transplant.

## 2011-12-14 NOTE — Telephone Encounter (Signed)
Ok so at conference today   - Madlyn Frankel picked up a new Left lung nodule that is PET COLD. Do not know if that is on the addendum of report but has grown slowly over years. Too tough a spot with vessels around to do any kind of biopsy  - Charline Bills said he spoke to [facility] and they are nervous to do CT guided TTNA of the RUL PET HOT nodule.    - Of note the plot thickens in that this patient has prior renal cell cancer  - Let me know what you think after seeing patient on 12/21/11

## 2011-12-14 NOTE — Telephone Encounter (Signed)
Pt returned call & asked to be reached at [phone].  Melanie Best

## 2011-12-14 NOTE — Telephone Encounter (Signed)
Called patient to give news about [facility] denial for lung transplant but husband said she is in PT. She wll call only in PM. If you do not mind, please update her. [facility] will send an official letter about this likely to her directly.   Thanks  MR PS: their rationale is below sent 12/12/11 or 12/13/11 .............................................    Our physicians have consulted on this patient, and unfortunately have declined the referral due to the combination of renal cell carcinoma (three years ago), new malignancy in RUL, chronic narcotic use, chronic anxiety and use of benzodiazepines, report of hepatic steatosis and s/p nephrectomy with stage 2 CKD. An official denial letter will be sent today. Please feel free to contact our office should you have any questions or concerns. Thank you again for your referral to our program.  Regards,  Ermalinda Barrios, MA  Program Specialist [facility]  Lung Transplant Program [phone] Tel [phone] Fax

## 2011-12-14 NOTE — Telephone Encounter (Signed)
Spoke with pt and notified of recs per MR and she verbalized understanding and states nothing further needed.

## 2011-12-14 NOTE — Telephone Encounter (Signed)
Spoke with pt. She is asking for a list of reasons of why she was declined for lung transplant. Please advise, thanks!

## 2011-12-19 ENCOUNTER — Encounter (HOSPITAL_COMMUNITY)
Admission: RE | Admit: 2011-12-19 | Discharge: 2011-12-19 | Disposition: A | Discharge status: Home / Self Care | Payer: Self-pay | Source: Ambulatory Visit | Attending: Internal Medicine | Admitting: Internal Medicine

## 2011-12-19 DIAGNOSIS — K219 Gastro-esophageal reflux disease without esophagitis: Secondary | ICD-10-CM | POA: Insufficient documentation

## 2011-12-19 DIAGNOSIS — Z5189 Encounter for other specified aftercare: Secondary | ICD-10-CM | POA: Insufficient documentation

## 2011-12-19 DIAGNOSIS — J4489 Other specified chronic obstructive pulmonary disease: Secondary | ICD-10-CM | POA: Insufficient documentation

## 2011-12-19 DIAGNOSIS — R0902 Hypoxemia: Secondary | ICD-10-CM | POA: Insufficient documentation

## 2011-12-19 DIAGNOSIS — J449 Chronic obstructive pulmonary disease, unspecified: Secondary | ICD-10-CM | POA: Insufficient documentation

## 2011-12-20 ENCOUNTER — Encounter: Payer: Self-pay | Admitting: Radiation Oncology

## 2011-12-20 NOTE — Progress Notes (Signed)
66 year old. Legally separated. Former smoker.  Hypermetabolic right upper lobe lung nodule.Assumed NSCLC.  Non-surgical candidate. Patient desiring lung transplant. Referred by Dr. Marchelle Gearing. Oxygen dependent since 2004.   NKDA No hx of radiation therapy No indication of a pacemaker

## 2011-12-21 ENCOUNTER — Encounter: Payer: Self-pay | Admitting: Radiation Oncology

## 2011-12-21 ENCOUNTER — Ambulatory Visit
Admission: RE | Admit: 2011-12-21 | Discharge: 2011-12-21 | Disposition: A | Discharge status: Home / Self Care | Payer: Medicare Other | Source: Ambulatory Visit | Attending: Radiation Oncology | Admitting: Radiation Oncology

## 2011-12-21 ENCOUNTER — Encounter (HOSPITAL_COMMUNITY): Payer: Self-pay

## 2011-12-21 VITALS — BP 143/73 | HR 113 | Temp 98.4°F | Resp 20 | Ht 66.0 in | Wt 180.1 lb

## 2011-12-21 DIAGNOSIS — Z86718 Personal history of other venous thrombosis and embolism: Secondary | ICD-10-CM | POA: Insufficient documentation

## 2011-12-21 DIAGNOSIS — C3410 Malignant neoplasm of upper lobe, unspecified bronchus or lung: Secondary | ICD-10-CM | POA: Insufficient documentation

## 2011-12-21 DIAGNOSIS — N189 Chronic kidney disease, unspecified: Secondary | ICD-10-CM | POA: Insufficient documentation

## 2011-12-21 DIAGNOSIS — Z510 Encounter for antineoplastic radiation therapy: Secondary | ICD-10-CM | POA: Insufficient documentation

## 2011-12-21 DIAGNOSIS — J449 Chronic obstructive pulmonary disease, unspecified: Secondary | ICD-10-CM | POA: Insufficient documentation

## 2011-12-21 DIAGNOSIS — Z79899 Other long term (current) drug therapy: Secondary | ICD-10-CM | POA: Insufficient documentation

## 2011-12-21 DIAGNOSIS — E119 Type 2 diabetes mellitus without complications: Secondary | ICD-10-CM | POA: Insufficient documentation

## 2011-12-21 DIAGNOSIS — I129 Hypertensive chronic kidney disease with stage 1 through stage 4 chronic kidney disease, or unspecified chronic kidney disease: Secondary | ICD-10-CM | POA: Insufficient documentation

## 2011-12-21 DIAGNOSIS — R911 Solitary pulmonary nodule: Secondary | ICD-10-CM

## 2011-12-21 DIAGNOSIS — E785 Hyperlipidemia, unspecified: Secondary | ICD-10-CM | POA: Insufficient documentation

## 2011-12-21 DIAGNOSIS — J4489 Other specified chronic obstructive pulmonary disease: Secondary | ICD-10-CM | POA: Insufficient documentation

## 2011-12-21 DIAGNOSIS — K219 Gastro-esophageal reflux disease without esophagitis: Secondary | ICD-10-CM | POA: Insufficient documentation

## 2011-12-21 DIAGNOSIS — Z87891 Personal history of nicotine dependence: Secondary | ICD-10-CM | POA: Insufficient documentation

## 2011-12-21 NOTE — Progress Notes (Signed)
See progress note under physician encounter. 

## 2011-12-21 NOTE — Progress Notes (Signed)
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 female  with a long history of COPD. Due to her significant COPD she has actually been referred to pulmonary rehabilitation and has been feeling better. She has been referred for lung transplant but has been rejected. Recent PFTs include FEV1 of 0.8 L with a D. LC oh at 35%. She is not a surgical candidate. An attempt at bronchoscopy to biopsy this right upper lobe mass showed benign lung parenchyma with reactive epithelial atypia. This lesion in the right upper lobe measures 3 x 1.8 x 1.0 cm and is unchanged in size from a CT in July. Adjacent nodules include a 5 mm LOC nodule in the right upper lobe and a 4-1/2 mm nodule in the left lower lobe. A third nodule measuring 1 x 1.3 cm in the left lower lobe is new from 2011. On a PET CT performed 731 and 13 the only hypermetabolic activity was seen in the right upper lobe nodule with an SUV of 6.1. She does have a prior history of a renal cell carcinoma a stage II removed surgically with no adjuvant treatment in 2010.  Her case was discussed in our multidisciplinary conference. It was felt she was too high risk for surgical resection and a transthoracic needle biopsy was also felt to be high risk given her level of COPD and emphysema. Her pulmonologist therefore referred her to me for consideration of definitive radiation in the management of this right upper lobe nodule. She is accompanied by her husband. She denies any weight loss headaches or worsening shortness of breath. She really feels like her breathing is gotten better over the past year with pulmonary rehabilitation. She does use oxygen at 2 L nasal cannula and  must sit with even minimal exertion of 10-15 feet or greater. She walks with a rolling walker.  PREVIOUS RADIATION THERAPY: No  PAST MEDICAL HISTORY:  has a past medical history of Leukocytosis; Glaucoma; Urinary incontinence; Dyslipidemia; Osteopenia; Anxiety; Tremor; Syncope; Diabetes mellitus, type 2; DVT (deep venous thrombosis), left; Anemia; Vitamin d deficiency; Shortness of breath; CKD (chronic kidney disease), stage II; Arthritis; Hypertension; Esophageal reflux; COPD (chronic obstructive pulmonary disease); Cancer; and Lung cancer.    PAST SURGICAL HISTORY: Past Surgical History  Procedure Date  . Right nephrectomy   . Back surgery     for pinched nerve, 2011, here at [facility]  . Eye surgery     cataracts removed, ?IOL  . Flexible bronchoscopy 11/29/2011    FAMILY HISTORY: family history includes Cancer in her brothers and sister and Emphysema in her father.  SOCIAL HISTORY:  reports that she quit smoking about 7 years ago. Her smoking use included Cigarettes. She has a 35 pack-year smoking history. She has never used smokeless tobacco. She reports that she does not drink alcohol or use illicit drugs.  ALLERGIES: Review of patient's allergies indicates no known allergies.  MEDICATIONS:  Current Outpatient Prescriptions  Medication Sig Dispense Refill  . albuterol (PROVENTIL HFA;VENTOLIN HFA) 108 (90 BASE) MCG/ACT inhaler Inhale 2 puffs into the lungs every 4 (four) hours as needed. For shortness of breath      . bimatoprost (LUMIGAN) 0.01 % SOLN Place 1 drop into both eyes  at bedtime.       . brinzolamide (AZOPT) 1 % ophthalmic suspension Place 1 drop into the right eye 3 (three) times daily.      . Calcium Carb-Cholecalciferol (CALCIUM 500 +D) 500-400 MG-UNIT TABS Take 2 tablets by mouth 2 times daily at 12 noon and 4 pm.       . clonazePAM (KLONOPIN) 1 MG tablet Take 1 mg by mouth 2 (two) times daily as needed. For anxiety      . diltiazem (CARDIZEM CD) 180 MG 24 hr capsule         . ergocalciferol (VITAMIN D2) 50000 UNITS capsule Take 50,000 Units by mouth once a week. Take on Monday      . esomeprazole (NEXIUM) 40 MG capsule Take 40 mg by mouth daily before breakfast.       . ipratropium-albuterol (DUONEB) 0.5-2.5 (3) MG/3ML SOLN Take 3 mLs by nebulization 4 (four) times daily as needed. For shortness of breath      . lovastatin (MEVACOR) 20 MG tablet Take 20 mg by mouth at bedtime.      . metFORMIN (GLUCOPHAGE) 500 MG tablet Take 500 mg by mouth daily with breakfast.       . predniSONE (DELTASONE) 10 MG tablet Take 10 mg by mouth daily.       Marland Kitchen tiotropium (SPIRIVA) 18 MCG inhalation capsule Place 18 mcg into inhaler and inhale daily with breakfast.       . AFLURIA PRESERVATIVE FREE injection       . furosemide (LASIX) 20 MG tablet Take 20 mg by mouth daily as needed.      Marland Kitchen HYDROcodone-acetaminophen (VICODIN) 5-500 MG per tablet Take 1 tablet by mouth every 6 (six) hours as needed. For pain      . hydrOXYzine (ATARAX/VISTARIL) 25 MG tablet Take 25 mg by mouth 3 (three) times daily as needed. For anxiety      . promethazine (PHENERGAN) 25 MG tablet Take 12.5 mg by mouth every 6 (six) hours as needed. For nausea        REVIEW OF SYSTEMS:  A 15 point review of systems is documented in the electronic medical record. This was obtained by the nursing staff. However, I reviewed this with the patient to discuss relevant findings and make appropriate changes.  Pertinent items are noted in HPI.   PHYSICAL EXAM:  height is 5\' 6"  (1.676 m) and weight is 180 lb 1.6 oz (81.693 kg). Her oral temperature is 98.4 F (36.9 C). Her blood pressure is 143/73 and her pulse is 113. Her respiration is 20 and oxygen saturation is 100%.   . She is a pleasant female who appears older than her stated age sitting comfortably examining chair. She is in no respiratory distress. She is alert and oriented x3. She has a nasal cannula in place. She has no palpable cervical or subclavicular adenopathy. She  has normal breath sounds.  LABORATORY DATA:  Lab Results  Component Value Date   WBC 16.6* 11/27/2011   HGB 12.7 11/27/2011   HCT 41.1 11/27/2011   MCV 101.0* 11/27/2011   PLT 321 11/27/2011   Lab Results  Component Value Date   NA 139 11/27/2011   K 4.4 11/27/2011   CL 99 11/27/2011   CO2 28 11/27/2011   Lab Results  Component Value Date   ALT 11 11/27/2011   AST 11 11/27/2011   ALKPHOS 83 11/27/2011   BILITOT 0.2* 11/27/2011     RADIOGRAPHY: Dg Chest 2 View  Within Previous 72 Hours.  Films Obtained On Friday Are Acceptable For Monday And Tuesday Cases  11/27/2011  *RADIOLOGY REPORT*  Clinical Data: Preop  CHEST - 2 VIEW  Comparison: 07/03/2011  Findings: Normal heart size. Right upper lobe nodule is noted as seen on the recent CT.  Hyperaeration.  No pneumothorax.  No pleural effusion.  IMPRESSION: Changes related to COPD.  Right upper lobe nodule.   Original Report Authenticated By: Donavan Burnet, M.D.    Dg Chest Port 1 View  11/29/2011  *RADIOLOGY REPORT*  Clinical Data: Shortness of breath status post bronchoscopy.  PORTABLE CHEST - 1 VIEW  Comparison: Chest x-ray 11/27/2011.  Findings: Known right upper lobe pulmonary nodule is again noted. Lung volumes are normal.  No pneumothorax.  The prominent bilateral lower lobe interstitial and airspace opacities are noted, that may reflect some mild subsegmental atelectasis or sequelae of mild aspiration.  No more consolidative airspace disease is noted.  No pleural effusions.  No evidence of pulmonary edema.  Heart size is normal.  Mediastinal contours are unremarkable.  Atherosclerosis in the thoracic aorta.  IMPRESSION: 1.  New bilateral lower lobe interstitial and airspace opacities favored to represent a combination of subsegmental atelectasis, and potentially sequelae of very mild aspiration. 2.  No pneumothorax. 3.  Right upper lobe pulmonary nodules again noted. 4.  Atherosclerosis.   Original Report Authenticated By: Florencia Reasons, M.D.    Dg C-arm  Bronchoscopy  11/29/2011  CLINICAL DATA: video bronchoscopy with endobronchial navigation   C-ARM BRONCHOSCOPY  Fluoroscopy was utilized by the requesting physician.  No radiographic  interpretation.        IMPRESSION: Likely T1 N0 non-small cell lung cancer the right upper lobe versus lung metastases from renal cell carcinoma  PLAN: I discussed with Melanie Best and her husband today her diagnosis and options for treatment. She does have a significant smoking history and therefore is at risk for primary lung cancer. She also has his previous history of a renal cell cancer and these could be metastatic nodules within the lungs. At this point we have a PET positive right upper lobe nodule that is growing over time. If this is a renal cell metastases there is evidence that metastectomy improve survival in renal cell patients. She is obviously not a surgical candidate so I think proceeding with definitive radiation whether this is a primary lung or metastases is prudent at this time. I also believe we can do this with minimal acute side effects to her. We discussed that the acute side effects can include some skin darkening fatigue and arm soreness due to positioning. We discussed the long-term the radiation may damage portions of her lung and cause decreased lung capacity. This would be 6 months to a year in the future we also discussed observing these other nodules and possible treatment of these should they continue to grow. In the end I think the down sides of treatment are significantly less than the catastrophic consequences of a untreated non-small cell lung cancer. She understands that there is a 10% chance that this nodule in her lung is something other than cancer including infection or inflammation. We discussed watchful observation as an alternative treatment. She has signed informed consent and agree to proceed forward. I discussed this with Dr. Marchelle Gearing as well and he would like to proceed forward with  treatment as well.  I spent 60 minutes  face to face with the patient and more than 50% of that time was spent  in counseling and/or coordination of care.   ------------------------------------------------  Lurline Hare, MD

## 2011-12-21 NOTE — Progress Notes (Signed)
Received patient in the clinic today for consultation with Dr. Michell Heinrich to discuss the role of radiation therapy in the treatment of lung ca. Patient is alert and oriented to person, place, and time. No distress noted. Slow steady gait noted with assistance of rolling walker. Pleasant affect noted. Patient denies pain at this time. Pulsating portal oxygen therapy via nasal cannula at 2 liters noted. Patient has been on oxygen therapy since 2004. Patient denies having a cough. Patient reports that even with oxygen therapy there are times she is short of breath with dyspnea. Patient denies difficulty laying flat while oxygen therapy is going. Patient denies nausea, vomiting, headache or dizziness. Patient reports that she has a good appetite. Weight stable. Patient reports that she sleeps without difficulty. Reported all findings to Dr. Michell Heinrich.

## 2011-12-25 ENCOUNTER — Ambulatory Visit
Admission: RE | Admit: 2011-12-25 | Discharge: 2011-12-25 | Disposition: A | Discharge status: Home / Self Care | Payer: Medicare Other | Source: Ambulatory Visit | Attending: Internal Medicine | Admitting: Internal Medicine

## 2011-12-25 DIAGNOSIS — Z1231 Encounter for screening mammogram for malignant neoplasm of breast: Secondary | ICD-10-CM

## 2011-12-25 LAB — FUNGUS CULTURE W SMEAR

## 2011-12-26 ENCOUNTER — Encounter (HOSPITAL_COMMUNITY)
Admission: RE | Admit: 2011-12-26 | Discharge: 2011-12-26 | Disposition: A | Discharge status: Home / Self Care | Payer: Self-pay | Source: Ambulatory Visit | Attending: Internal Medicine | Admitting: Internal Medicine

## 2011-12-28 ENCOUNTER — Encounter (HOSPITAL_COMMUNITY)
Admission: RE | Admit: 2011-12-28 | Discharge: 2011-12-28 | Disposition: A | Discharge status: Home / Self Care | Payer: Self-pay | Source: Ambulatory Visit | Attending: Internal Medicine | Admitting: Internal Medicine

## 2011-12-28 ENCOUNTER — Ambulatory Visit
Admission: RE | Admit: 2011-12-28 | Discharge: 2011-12-28 | Disposition: A | Discharge status: Home / Self Care | Payer: Medicare Other | Source: Ambulatory Visit | Attending: Radiation Oncology | Admitting: Radiation Oncology

## 2011-12-28 DIAGNOSIS — R911 Solitary pulmonary nodule: Secondary | ICD-10-CM

## 2011-12-28 NOTE — Progress Notes (Signed)
Met with patient to discuss RO billing.  Dx: 793.11 Nodule, lung, solitary  Attending Rad: Dr. Iona Hansen Tx:  [facility] Robot x5

## 2011-12-28 NOTE — Progress Notes (Signed)
[facility] Radiation Oncology Simulation and Treatment Planning Note   Name: Melanie Best MRN: [id number]  Date: 12/28/2011  DOB: Jan 23, 1946  Status: outpatient  DIAGNOSIS: T1N0 NSCLC of right upper lobe  SIDE: right  CONSENT VERIFIED: yes  SET UP AND IMMOBILIZATION: Patient is setup supine in a vac loc with a custom moldable pillow for head and neck immobilization   NARRATIVE: The patient was brought to the CT Simulation planning suite.  Identity was confirmed.  All relevant records and images related to the planned course of therapy were reviewed.  Then, the patient was positioned in a stable reproducible clinical set-up for radiation therapy.  CT images were obtained.  Skin markings were placed.  A four dimensional simulation was then performed to track tumor movement throughout the patients' breathing cycle. The CT images were loaded into the planning software where the target and avoidance structures were contoured.  The GTV was outlined on the free breathing, 4D and MIP image sets.  The radiation prescription was entered and confirmed.   TREATMENT PLANNING NOTE:  Treatment planning then occurred. I have requested 3D simulation with [facility] of the spinal cord, total lungs and gross tumor volume. I have also requested mlcs and an isodose plan.   Special treatment procedure will be performed as Chelsea Primus will be receiving high dose per fraction.

## 2011-12-29 NOTE — Addendum Note (Signed)
Encounter addended by: Sheriece Jefcoat Mintz Crickett Abbett, RN on: 12/29/2011  6:33 PM<BR>     Documentation filed: Charges VN

## 2012-01-02 ENCOUNTER — Encounter (HOSPITAL_COMMUNITY)
Admission: RE | Admit: 2012-01-02 | Discharge: 2012-01-02 | Disposition: A | Discharge status: Home / Self Care | Payer: Self-pay | Source: Ambulatory Visit | Attending: Internal Medicine | Admitting: Internal Medicine

## 2012-01-04 ENCOUNTER — Encounter (HOSPITAL_COMMUNITY)
Admission: RE | Admit: 2012-01-04 | Discharge: 2012-01-04 | Disposition: A | Discharge status: Home / Self Care | Payer: Self-pay | Source: Ambulatory Visit | Attending: Internal Medicine | Admitting: Internal Medicine

## 2012-01-09 ENCOUNTER — Ambulatory Visit
Admission: RE | Admit: 2012-01-09 | Discharge: 2012-01-09 | Disposition: A | Discharge status: Home / Self Care | Payer: Medicare Other | Source: Ambulatory Visit | Attending: Radiation Oncology | Admitting: Radiation Oncology

## 2012-01-09 ENCOUNTER — Encounter: Payer: Self-pay | Admitting: Radiation Oncology

## 2012-01-09 ENCOUNTER — Encounter (HOSPITAL_COMMUNITY): Payer: Self-pay

## 2012-01-09 VITALS — BP 126/76 | HR 108 | Resp 18 | Wt 182.0 lb

## 2012-01-09 DIAGNOSIS — R911 Solitary pulmonary nodule: Secondary | ICD-10-CM

## 2012-01-09 NOTE — Progress Notes (Signed)
Weekly Management Note Current Dose:  10 Gy  Projected Dose: 50 Gy   Narrative:  The patient presents for routine under treatment assessment.  CBCT/MVCT images/Port film x-rays were reviewed.  The chart was checked. Doing well. No complaints after treatment RN education performed.   Physical Findings: Weight: 182 lb (82.555 kg). Unchanged  Impression:  The patient is tolerating radiation.  Plan:  Continue treatment as planned.

## 2012-01-09 NOTE — Progress Notes (Signed)
  Radiation Oncology         (336) [phone] ________________________________  Name: Melanie Best MRN: [id number]  Date: 01/09/2012  DOB: 23-Jul-1945  Stereotactic Body Radiotherapy Treatment Procedure Note  NARRATIVE:  Melanie Best was brought to the stereotactic radiation treatment machine and placed supine on the CT couch. The patient was set up for stereotactic body radiotherapy on the body fix pillow.  3D TREATMENT PLANNING AND DOSIMETRY:  The patient's radiation plan was reviewed and approved prior to starting treatment.  It showed 3-dimensional radiation distributions overlaid onto the planning CT.  The [facility] for the target structures as well as the organs at risk were reviewed. The documentation of this is filed in the radiation oncology EMR.  SIMULATION VERIFICATION:  The patient underwent CT imaging on the treatment unit.  These were carefully aligned to document that the ablative radiation dose would cover the target volume and maximally spare the nearby organs at risk according to the planned distribution.  SPECIAL TREATMENT PROCEDURE: Melanie Best received high dose ablative stereotactic body radiotherapy to the planned target volume without unforeseen complications. Treatment was delivered uneventfully. The high doses associated with stereotactic body radiotherapy and the significant potential risks require careful treatment set up and patient monitoring constituting a special treatment procedure   STEREOTACTIC TREATMENT MANAGEMENT:  Following delivery, the patient was evaluated clinically. The patient tolerated treatment without significant acute effects, and was discharged to home in stable condition.    PLAN: Continue treatment as planned.  Fraction 1/5  _________________________   Lurline Hare, MD

## 2012-01-09 NOTE — Progress Notes (Signed)
Received patient in clinic unaccompanied for PUT with Dr. Michell Heinrich. Patient alert and oriented to person, place, and time. No distress noted. Slow steady gait noted with assistance of rolling walker. Patient denies pain at this time. Pulsating oxygen therapy via nasal cannula 2 liters noted. Patient denies cough at this time. Patient reports shortness of breath even with continuous oxygen therapy when she exerts herself. Patient denies sore throat or difficulty swallowing. Reported all findings to Dr. Michell Heinrich.

## 2012-01-11 ENCOUNTER — Encounter (HOSPITAL_COMMUNITY): Payer: Self-pay

## 2012-01-11 ENCOUNTER — Encounter: Payer: Self-pay | Admitting: Radiation Oncology

## 2012-01-11 ENCOUNTER — Ambulatory Visit
Admission: RE | Admit: 2012-01-11 | Discharge: 2012-01-11 | Disposition: A | Discharge status: Home / Self Care | Payer: Medicare Other | Source: Ambulatory Visit | Attending: Radiation Oncology | Admitting: Radiation Oncology

## 2012-01-11 ENCOUNTER — Ambulatory Visit: Payer: Medicare Other | Admitting: Radiation Oncology

## 2012-01-11 LAB — AFB CULTURE WITH SMEAR (NOT AT ARMC): Acid Fast Smear: NONE SEEN

## 2012-01-15 ENCOUNTER — Ambulatory Visit
Admission: RE | Admit: 2012-01-15 | Discharge: 2012-01-15 | Disposition: A | Discharge status: Home / Self Care | Payer: Medicare Other | Source: Ambulatory Visit | Attending: Radiation Oncology | Admitting: Radiation Oncology

## 2012-01-15 ENCOUNTER — Ambulatory Visit: Payer: Medicare Other | Admitting: Radiation Oncology

## 2012-01-15 DIAGNOSIS — R911 Solitary pulmonary nodule: Secondary | ICD-10-CM

## 2012-01-15 NOTE — Progress Notes (Signed)
  Radiation Oncology         [phone] ________________________________  Name: Melanie Best MRN: [id number]  Date: 01/15/2012  DOB: 03/31/1945  Stereotactic Body Radiotherapy Treatment Procedure Note  NARRATIVE:  Melanie Best was brought to the stereotactic radiation treatment machine and placed supine on the CT couch. The patient was set up for stereotactic body radiotherapy on the body fix pillow.  3D TREATMENT PLANNING AND DOSIMETRY:  The patient's radiation plan was reviewed and approved prior to starting treatment.  It showed 3-dimensional radiation distributions overlaid onto the planning CT.  The [facility] for the target structures as well as the organs at risk were reviewed. The documentation of this is filed in the radiation oncology EMR.  SIMULATION VERIFICATION:  The patient underwent CT imaging on the treatment unit.  These were carefully aligned to document that the ablative radiation dose would cover the target volume and maximally spare the nearby organs at risk according to the planned distribution.  SPECIAL TREATMENT PROCEDURE: Melanie Best received high dose ablative stereotactic body radiotherapy to the planned target volume without unforeseen complications. Treatment was delivered uneventfully. The high doses associated with stereotactic body radiotherapy and the significant potential risks require careful treatment set up and patient monitoring constituting a special treatment procedure   STEREOTACTIC TREATMENT MANAGEMENT:  Following delivery, the patient was evaluated clinically. The patient tolerated treatment without significant acute effects, and was discharged to home in stable condition.    PLAN: Continue treatment as planned.  ________________________________  Billie Lade, PhD, MD

## 2012-01-15 NOTE — Progress Notes (Signed)
Late entry from 01/09/2012 at 1200. Received patient in the clinic today for post sim education with Sam, RN. Oriented patient to staff and routine of the clinic. Provided patient with RADIATION THERAPY AND YOU handbook then, reviewed pertinent information. Educated patient on minimal if any potential side effects associated with SRS treatment and management such as fatigue. Provided patient with this writer's business card and encouraged to call with needs. All questions answered. Patient verbalized understanding of all things reviewed.

## 2012-01-15 NOTE — Addendum Note (Signed)
Encounter addended by: Agnes Lawrence, RN on: 01/15/2012  7:02 PM<BR>     Documentation filed: Inpatient Patient Education, Inpatient Document Flowsheet, Notes Section, Chief Complaint Section

## 2012-01-16 ENCOUNTER — Encounter: Admission: RE | Admit: 2012-01-16 | Payer: Self-pay | Source: Ambulatory Visit

## 2012-01-16 ENCOUNTER — Telehealth: Payer: Self-pay | Admitting: *Deleted

## 2012-01-16 DIAGNOSIS — E119 Type 2 diabetes mellitus without complications: Secondary | ICD-10-CM

## 2012-01-16 NOTE — Telephone Encounter (Signed)
Message copied by Darrell Jewel on Tue Jan 16, 2012  2:54 PM ------      Message from: [place], [place] J      Created: Thu Dec 14, 2011  3:23 PM      Regarding: pt is diabetic and will need labs prior to ct       Spoke with patient and she is aware of CT Chest with contrast scheduled for 02/26/12.            #pt is a diabetic and is on metformin            Pt is aware that she will need to come in for labs the week of 02/19/12.            Thanks,      Bjorn Loser

## 2012-01-16 NOTE — Telephone Encounter (Signed)
Order placed. [name], CMA  

## 2012-01-17 ENCOUNTER — Ambulatory Visit
Admission: RE | Admit: 2012-01-17 | Discharge: 2012-01-17 | Disposition: A | Discharge status: Home / Self Care | Payer: Medicare Other | Source: Ambulatory Visit | Attending: Radiation Oncology | Admitting: Radiation Oncology

## 2012-01-17 ENCOUNTER — Ambulatory Visit: Payer: Medicare Other | Admitting: Radiation Oncology

## 2012-01-17 ENCOUNTER — Encounter: Payer: Self-pay | Admitting: Radiation Oncology

## 2012-01-18 ENCOUNTER — Encounter (HOSPITAL_COMMUNITY)
Admission: RE | Admit: 2012-01-18 | Discharge: 2012-01-18 | Disposition: A | Discharge status: Home / Self Care | Payer: Self-pay | Source: Ambulatory Visit | Attending: Internal Medicine | Admitting: Internal Medicine

## 2012-01-19 ENCOUNTER — Ambulatory Visit
Admission: RE | Admit: 2012-01-19 | Discharge: 2012-01-19 | Disposition: A | Discharge status: Home / Self Care | Payer: Medicare Other | Source: Ambulatory Visit | Attending: Radiation Oncology | Admitting: Radiation Oncology

## 2012-01-19 ENCOUNTER — Encounter: Payer: Self-pay | Admitting: Radiation Oncology

## 2012-01-19 ENCOUNTER — Ambulatory Visit: Payer: Medicare Other | Admitting: Radiation Oncology

## 2012-01-22 NOTE — Progress Notes (Signed)
  Radiation Oncology         (336) [phone] ________________________________  Name: NAJE RICE MRN: [id number]  Date: 01/17/2012  DOB: 22-Sep-1945  Stereotactic Body Radiotherapy Treatment Procedure Note  NARRATIVE:  JENNEA RAGER was brought to the stereotactic radiation treatment machine and placed supine on the CT couch. The patient was set up for stereotactic body radiotherapy on the body fix pillow.  3D TREATMENT PLANNING AND DOSIMETRY:  The patient's radiation plan was reviewed and approved prior to starting treatment.  It showed 3-dimensional radiation distributions overlaid onto the planning CT.  The [facility] for the target structures as well as the organs at risk were reviewed. The documentation of this is filed in the radiation oncology EMR.  SIMULATION VERIFICATION:  The patient underwent CT imaging on the treatment unit.  These were carefully aligned to document that the ablative radiation dose would cover the target volume and maximally spare the nearby organs at risk according to the planned distribution.  SPECIAL TREATMENT PROCEDURE: Chelsea Primus received high dose ablative stereotactic body radiotherapy to the planned target volume without unforeseen complications. Treatment was delivered uneventfully. The high doses associated with stereotactic body radiotherapy and the significant potential risks require careful treatment set up and patient monitoring constituting a special treatment procedure   STEREOTACTIC TREATMENT MANAGEMENT:  Following delivery, the patient was evaluated clinically. The patient tolerated treatment without significant acute effects, and was discharged to home in stable condition.    PLAN: Continue treatment as planned.  ________________________________

## 2012-01-22 NOTE — Progress Notes (Signed)
  Radiation Oncology         [phone]) [phone] ________________________________  Name: Melanie Best MRN: [id number]  Date: 01/19/2012  DOB: [birth year]  End of Treatment Note  Diagnosis:   T1N0 NSCLC of right upper lobe   Indication for treatment:  Curative      Radiation treatment dates:  01/09/2012, 01/11/2012, 01/15/2012, 01/17/2012, 01/19/2012  Site/dose:   Right upper lobe / 50 Gy at 10 Gy per fraction x 5 fractions  Beams/energy:   2 Dynamic conformal arcs with daily image guidance using 6 MV photons   Narrative: The patient tolerated radiation treatment relatively well.   She had no worsening of her respiratory symptoms.   Plan: The patient has completed radiation treatment. The patient will return to radiation oncology clinic for routine followup in one month. I advised them to call or return sooner if they have any questions or concerns related to their recovery or treatment.  ------------------------------------------------  Lurline Hare, MD

## 2012-01-22 NOTE — Progress Notes (Signed)
  Radiation Oncology         (336) [phone] ________________________________  Name: Melanie Best MRN: [id number]  Date: 01/11/2012  DOB: [birth year]  Stereotactic Body Radiotherapy Treatment Procedure Note  NARRATIVE:  Melanie Best was brought to the stereotactic radiation treatment machine and placed supine on the CT couch. The patient was set up for stereotactic body radiotherapy on the body fix pillow.  3D TREATMENT PLANNING AND DOSIMETRY:  The patient's radiation plan was reviewed and approved prior to starting treatment.  It showed 3-dimensional radiation distributions overlaid onto the planning CT.  The [facility] for the target structures as well as the organs at risk were reviewed. The documentation of this is filed in the radiation oncology EMR.  SIMULATION VERIFICATION:  The patient underwent CT imaging on the treatment unit.  These were carefully aligned to document that the ablative radiation dose would cover the target volume and maximally spare the nearby organs at risk according to the planned distribution.  SPECIAL TREATMENT PROCEDURE: Melanie Best received high dose ablative stereotactic body radiotherapy to the planned target volume without unforeseen complications. Treatment was delivered uneventfully. The high doses associated with stereotactic body radiotherapy and the significant potential risks require careful treatment set up and patient monitoring constituting a special treatment procedure   STEREOTACTIC TREATMENT MANAGEMENT:  Following delivery, the patient was evaluated clinically. The patient tolerated treatment without significant acute effects, and was discharged to home in stable condition.    PLAN: Continue treatment as planned.  ________________________________

## 2012-01-22 NOTE — Progress Notes (Signed)
°  Radiation Oncology         [phone] ________________________________  Name: CHALISE PE MRN: [id number]  Date: 01/19/2012  DOB: [birth year]  Stereotactic Body Radiotherapy Treatment Procedure Note  NARRATIVE:  KORRINE SICARD was brought to the stereotactic radiation treatment machine and placed supine on the CT couch. The patient was set up for stereotactic body radiotherapy on the body fix pillow.  3D TREATMENT PLANNING AND DOSIMETRY:  The patient's radiation plan was reviewed and approved prior to starting treatment.  It showed 3-dimensional radiation distributions overlaid onto the planning CT.  The [facility] for the target structures as well as the organs at risk were reviewed. The documentation of this is filed in the radiation oncology EMR.  SIMULATION VERIFICATION:  The patient underwent CT imaging on the treatment unit.  These were carefully aligned to document that the ablative radiation dose would cover the target volume and maximally spare the nearby organs at risk according to the planned distribution.  SPECIAL TREATMENT PROCEDURE: Chelsea Primus received high dose ablative stereotactic body radiotherapy to the planned target volume without unforeseen complications. Treatment was delivered uneventfully. The high doses associated with stereotactic body radiotherapy and the significant potential risks require careful treatment set up and patient monitoring constituting a special treatment procedure   STEREOTACTIC TREATMENT MANAGEMENT:  Following delivery, the patient was evaluated clinically. The patient tolerated treatment without significant acute effects, and was discharged to home in stable condition.    PLAN: Continue treatment as planned.  ________________________________

## 2012-01-23 ENCOUNTER — Encounter (HOSPITAL_COMMUNITY)
Admission: RE | Admit: 2012-01-23 | Discharge: 2012-01-23 | Disposition: A | Discharge status: Home / Self Care | Payer: Self-pay | Source: Ambulatory Visit | Attending: Internal Medicine | Admitting: Internal Medicine

## 2012-01-23 DIAGNOSIS — K219 Gastro-esophageal reflux disease without esophagitis: Secondary | ICD-10-CM | POA: Insufficient documentation

## 2012-01-23 DIAGNOSIS — Z5189 Encounter for other specified aftercare: Secondary | ICD-10-CM | POA: Insufficient documentation

## 2012-01-23 DIAGNOSIS — J4489 Other specified chronic obstructive pulmonary disease: Secondary | ICD-10-CM | POA: Insufficient documentation

## 2012-01-23 DIAGNOSIS — R0902 Hypoxemia: Secondary | ICD-10-CM | POA: Insufficient documentation

## 2012-01-23 DIAGNOSIS — J449 Chronic obstructive pulmonary disease, unspecified: Secondary | ICD-10-CM | POA: Insufficient documentation

## 2012-01-25 ENCOUNTER — Encounter (HOSPITAL_COMMUNITY): Payer: Self-pay

## 2012-01-27 ENCOUNTER — Encounter (HOSPITAL_COMMUNITY): Payer: Self-pay | Admitting: Physical Medicine and Rehabilitation

## 2012-01-27 ENCOUNTER — Other Ambulatory Visit (HOSPITAL_COMMUNITY): Payer: Medicare Other

## 2012-01-27 ENCOUNTER — Emergency Department (HOSPITAL_COMMUNITY): Payer: Medicare Other

## 2012-01-27 ENCOUNTER — Observation Stay (HOSPITAL_COMMUNITY)
Admission: EM | Admit: 2012-01-27 | Discharge: 2012-01-29 | Disposition: A | Discharge status: Home / Self Care | Payer: Medicare Other | Attending: Internal Medicine | Admitting: Internal Medicine

## 2012-01-27 ENCOUNTER — Observation Stay (HOSPITAL_COMMUNITY): Payer: Medicare Other

## 2012-01-27 DIAGNOSIS — R911 Solitary pulmonary nodule: Secondary | ICD-10-CM

## 2012-01-27 DIAGNOSIS — Z86718 Personal history of other venous thrombosis and embolism: Secondary | ICD-10-CM | POA: Insufficient documentation

## 2012-01-27 DIAGNOSIS — I10 Essential (primary) hypertension: Secondary | ICD-10-CM

## 2012-01-27 DIAGNOSIS — R079 Chest pain, unspecified: Principal | ICD-10-CM | POA: Insufficient documentation

## 2012-01-27 DIAGNOSIS — R0602 Shortness of breath: Secondary | ICD-10-CM | POA: Insufficient documentation

## 2012-01-27 DIAGNOSIS — E119 Type 2 diabetes mellitus without complications: Secondary | ICD-10-CM

## 2012-01-27 DIAGNOSIS — J449 Chronic obstructive pulmonary disease, unspecified: Secondary | ICD-10-CM

## 2012-01-27 DIAGNOSIS — K219 Gastro-esophageal reflux disease without esophagitis: Secondary | ICD-10-CM | POA: Diagnosis present

## 2012-01-27 DIAGNOSIS — E875 Hyperkalemia: Secondary | ICD-10-CM | POA: Insufficient documentation

## 2012-01-27 DIAGNOSIS — Z923 Personal history of irradiation: Secondary | ICD-10-CM | POA: Insufficient documentation

## 2012-01-27 DIAGNOSIS — J4489 Other specified chronic obstructive pulmonary disease: Secondary | ICD-10-CM | POA: Insufficient documentation

## 2012-01-27 DIAGNOSIS — I129 Hypertensive chronic kidney disease with stage 1 through stage 4 chronic kidney disease, or unspecified chronic kidney disease: Secondary | ICD-10-CM | POA: Insufficient documentation

## 2012-01-27 DIAGNOSIS — F419 Anxiety disorder, unspecified: Secondary | ICD-10-CM | POA: Diagnosis present

## 2012-01-27 DIAGNOSIS — D72829 Elevated white blood cell count, unspecified: Secondary | ICD-10-CM | POA: Diagnosis present

## 2012-01-27 DIAGNOSIS — R7989 Other specified abnormal findings of blood chemistry: Secondary | ICD-10-CM

## 2012-01-27 DIAGNOSIS — C3490 Malignant neoplasm of unspecified part of unspecified bronchus or lung: Secondary | ICD-10-CM | POA: Insufficient documentation

## 2012-01-27 DIAGNOSIS — F411 Generalized anxiety disorder: Secondary | ICD-10-CM | POA: Insufficient documentation

## 2012-01-27 DIAGNOSIS — E785 Hyperlipidemia, unspecified: Secondary | ICD-10-CM

## 2012-01-27 DIAGNOSIS — Z9981 Dependence on supplemental oxygen: Secondary | ICD-10-CM | POA: Insufficient documentation

## 2012-01-27 DIAGNOSIS — N182 Chronic kidney disease, stage 2 (mild): Secondary | ICD-10-CM | POA: Insufficient documentation

## 2012-01-27 HISTORY — DX: Type 2 diabetes mellitus without complications: E11.9

## 2012-01-27 HISTORY — DX: Hyperlipidemia, unspecified: E78.5

## 2012-01-27 HISTORY — DX: Gastro-esophageal reflux disease without esophagitis: K21.9

## 2012-01-27 HISTORY — DX: Essential (primary) hypertension: I10

## 2012-01-27 LAB — COMPREHENSIVE METABOLIC PANEL
Albumin: 3.8 g/dL (ref 3.5–5.2)
Alkaline Phosphatase: 94 U/L (ref 39–117)
BUN: 15 mg/dL (ref 6–23)
Chloride: 98 mEq/L (ref 96–112)
GFR calc Af Amer: 58 mL/min — ABNORMAL LOW (ref >90)
Glucose, Bld: 153 mg/dL — ABNORMAL HIGH (ref 70–99)
Potassium: 3.5 mEq/L (ref 3.5–5.1)
Total Bilirubin: 0.2 mg/dL — ABNORMAL LOW (ref 0.3–1.2)

## 2012-01-27 LAB — LIPASE, BLOOD
Lipase: 17 U/L (ref 11–59)
Lipase: 18 U/L (ref 11–59)

## 2012-01-27 LAB — CBC
Hemoglobin: 11.8 g/dL — ABNORMAL LOW (ref 12.0–15.0)
MCH: 31.8 pg (ref 26.0–34.0)
MCV: 100.8 fL — ABNORMAL HIGH (ref 78.0–100.0)
Platelets: 264 10*3/uL (ref 150–400)
RBC: 3.71 MIL/uL — ABNORMAL LOW (ref 3.87–5.11)
WBC: 13.3 10*3/uL — ABNORMAL HIGH (ref 4.0–10.5)

## 2012-01-27 LAB — POCT I-STAT TROPONIN I
Troponin i, poc: 0 ng/mL (ref 0.00–0.08)
Troponin i, poc: 0 ng/mL (ref 0.00–0.08)
Troponin i, poc: 0 ng/mL (ref 0.00–0.08)

## 2012-01-27 LAB — D-DIMER, QUANTITATIVE
D-Dimer, Quant: 0.66 ug/mL-FEU — ABNORMAL HIGH (ref 0.00–0.48)
D-Dimer, Quant: 0.51 ug/mL-FEU — ABNORMAL HIGH (ref 0.00–0.48)

## 2012-01-27 LAB — URINALYSIS, ROUTINE W REFLEX MICROSCOPIC
Leukocytes, UA: NEGATIVE
Nitrite: NEGATIVE
Specific Gravity, Urine: 1.015 (ref 1.005–1.030)
Urobilinogen, UA: 0.2 mg/dL (ref 0.0–1.0)
pH: 5.5 (ref 5.0–8.0)

## 2012-01-27 LAB — MAGNESIUM: Magnesium: 2 mg/dL (ref 1.5–2.5)

## 2012-01-27 LAB — CREATININE, SERUM
Creatinine, Ser: 1.33 mg/dL — ABNORMAL HIGH (ref 0.50–1.10)
GFR calc Af Amer: 48 mL/min — ABNORMAL LOW (ref >90)

## 2012-01-27 LAB — CBC WITH DIFFERENTIAL/PLATELET
Hemoglobin: 12.1 g/dL (ref 12.0–15.0)
Lymphs Abs: 3.1 10*3/uL (ref 0.7–4.0)
Monocytes Relative: 4 % (ref 3–12)
Neutro Abs: 9.1 10*3/uL — ABNORMAL HIGH (ref 1.7–7.7)
Neutrophils Relative %: 71 % (ref 43–77)
RBC: 3.82 MIL/uL — ABNORMAL LOW (ref 3.87–5.11)

## 2012-01-27 LAB — CK TOTAL AND CKMB (NOT AT ARMC)
CK, MB: 4 ng/mL (ref 0.3–4.0)
Relative Index: 1.8 (ref 0.0–2.5)
Total CK: 223 U/L — ABNORMAL HIGH (ref 7–177)

## 2012-01-27 LAB — APTT: aPTT: 28 seconds (ref 24–37)

## 2012-01-27 MED ORDER — IPRATROPIUM BROMIDE 0.02 % IN SOLN: 0.5000 mg | Freq: Four times a day (QID) | RESPIRATORY_TRACT | Status: DC | PRN

## 2012-01-27 MED ORDER — ONDANSETRON HCL 4 MG PO TABS: 4.0000 mg | ORAL_TABLET | Freq: Four times a day (QID) | ORAL | Status: DC | PRN

## 2012-01-27 MED ORDER — BISACODYL 5 MG PO TBEC
10.0000 mg | DELAYED_RELEASE_TABLET | Freq: Every day | ORAL | Status: DC | PRN
  Filled 2012-01-27: qty 2

## 2012-01-27 MED ORDER — ACETAMINOPHEN 325 MG PO TABS: 650.0000 mg | ORAL_TABLET | Freq: Four times a day (QID) | ORAL | Status: DC | PRN

## 2012-01-27 MED ORDER — ENOXAPARIN SODIUM 80 MG/0.8ML ~~LOC~~ SOLN
70.0000 mg | Freq: Two times a day (BID) | SUBCUTANEOUS | Status: DC
  Administered 2012-01-27 – 2012-01-28 (×2): 70 mg via SUBCUTANEOUS
  Filled 2012-01-27 (×4): qty 0.8

## 2012-01-27 MED ORDER — ENOXAPARIN SODIUM 40 MG/0.4ML ~~LOC~~ SOLN: 40.0000 mg | SUBCUTANEOUS | Status: DC

## 2012-01-27 MED ORDER — MORPHINE SULFATE 2 MG/ML IJ SOLN: 1.0000 mg | INTRAMUSCULAR | Status: DC | PRN

## 2012-01-27 MED ORDER — IOHEXOL 350 MG/ML SOLN
100.0000 mL | Freq: Once | INTRAVENOUS | Status: AC | PRN
Start: 2012-01-27 — End: 2012-01-27
  Administered 2012-01-27: 100 mL via INTRAVENOUS

## 2012-01-27 MED ORDER — PANTOPRAZOLE SODIUM 40 MG PO TBEC
80.0000 mg | DELAYED_RELEASE_TABLET | Freq: Every day | ORAL | Status: DC
  Administered 2012-01-28: 80 mg via ORAL
  Filled 2012-01-27: qty 2

## 2012-01-27 MED ORDER — PREDNISOLONE ACETATE 1 % OP SUSP
1.0000 [drp] | Freq: Every day | OPHTHALMIC | Status: DC | PRN
Start: 2012-01-27 — End: 2012-01-29

## 2012-01-27 MED ORDER — ASPIRIN 81 MG PO CHEW
324.0000 mg | CHEWABLE_TABLET | Freq: Once | ORAL | Status: AC
  Administered 2012-01-27: 324 mg via ORAL
  Filled 2012-01-27: qty 4

## 2012-01-27 MED ORDER — DORZOLAMIDE HCL 2 % OP SOLN
1.0000 [drp] | Freq: Three times a day (TID) | OPHTHALMIC | Status: DC
  Administered 2012-01-27 – 2012-01-29 (×6): 1 [drp] via OPHTHALMIC
  Filled 2012-01-27 (×2): qty 10

## 2012-01-27 MED ORDER — ASPIRIN EC 81 MG PO TBEC
81.0000 mg | DELAYED_RELEASE_TABLET | Freq: Every day | ORAL | Status: DC
  Administered 2012-01-27 – 2012-01-29 (×3): 81 mg via ORAL
  Filled 2012-01-27 (×3): qty 1

## 2012-01-27 MED ORDER — IPRATROPIUM-ALBUTEROL 18-103 MCG/ACT IN AERO: 2.0000 | INHALATION_SPRAY | Freq: Four times a day (QID) | RESPIRATORY_TRACT | Status: DC | PRN

## 2012-01-27 MED ORDER — ACETAMINOPHEN 650 MG RE SUPP: 650.0000 mg | Freq: Four times a day (QID) | RECTAL | Status: DC | PRN

## 2012-01-27 MED ORDER — ONDANSETRON HCL 4 MG/2ML IJ SOLN: 4.0000 mg | Freq: Four times a day (QID) | INTRAMUSCULAR | Status: DC | PRN

## 2012-01-27 MED ORDER — NITROGLYCERIN 0.4 MG SL SUBL: 0.4000 mg | SUBLINGUAL_TABLET | SUBLINGUAL | Status: DC | PRN

## 2012-01-27 MED ORDER — TIOTROPIUM BROMIDE MONOHYDRATE 18 MCG IN CAPS
18.0000 ug | ORAL_CAPSULE | Freq: Every day | RESPIRATORY_TRACT | Status: DC
  Administered 2012-01-28 – 2012-01-29 (×2): 18 ug via RESPIRATORY_TRACT
  Filled 2012-01-27: qty 5

## 2012-01-27 MED ORDER — CLONAZEPAM 1 MG PO TABS: 1.0000 mg | ORAL_TABLET | Freq: Two times a day (BID) | ORAL | Status: DC | PRN

## 2012-01-27 MED ORDER — ALUM & MAG HYDROXIDE-SIMETH 200-200-20 MG/5ML PO SUSP
30.0000 mL | Freq: Four times a day (QID) | ORAL | Status: DC | PRN
  Administered 2012-01-28: 30 mL via ORAL
  Filled 2012-01-27 (×2): qty 30

## 2012-01-27 MED ORDER — PROMETHAZINE HCL 25 MG PO TABS: 25.0000 mg | ORAL_TABLET | Freq: Four times a day (QID) | ORAL | Status: DC | PRN

## 2012-01-27 MED ORDER — ALBUTEROL SULFATE (5 MG/ML) 0.5% IN NEBU
2.5000 mg | INHALATION_SOLUTION | Freq: Four times a day (QID) | RESPIRATORY_TRACT | Status: DC | PRN
  Administered 2012-01-27 – 2012-01-28 (×2): 2.5 mg via RESPIRATORY_TRACT
  Filled 2012-01-27 (×2): qty 0.5

## 2012-01-27 MED ORDER — HYDROXYZINE HCL 25 MG PO TABS: 25.0000 mg | ORAL_TABLET | Freq: Three times a day (TID) | ORAL | Status: DC | PRN

## 2012-01-27 MED ORDER — HYDROCODONE-ACETAMINOPHEN 5-325 MG PO TABS: 1.0000 | ORAL_TABLET | Freq: Four times a day (QID) | ORAL | Status: DC | PRN

## 2012-01-27 MED ORDER — HYDROCODONE-ACETAMINOPHEN 5-500 MG PO TABS: 1.0000 | ORAL_TABLET | Freq: Four times a day (QID) | ORAL | Status: DC | PRN

## 2012-01-27 MED ORDER — DILTIAZEM HCL ER COATED BEADS 180 MG PO CP24
180.0000 mg | ORAL_CAPSULE | Freq: Every day | ORAL | Status: DC
  Administered 2012-01-28 – 2012-01-29 (×2): 180 mg via ORAL
  Filled 2012-01-27 (×2): qty 1

## 2012-01-27 MED ORDER — ALBUTEROL SULFATE HFA 108 (90 BASE) MCG/ACT IN AERS: 2.0000 | INHALATION_SPRAY | RESPIRATORY_TRACT | Status: DC | PRN

## 2012-01-27 MED ORDER — NITROGLYCERIN 0.3 MG SL SUBL
0.3000 mg | SUBLINGUAL_TABLET | SUBLINGUAL | Status: DC | PRN
  Filled 2012-01-27: qty 100

## 2012-01-27 MED ORDER — POLYETHYLENE GLYCOL 3350 17 G PO PACK
17.0000 g | PACK | Freq: Every day | ORAL | Status: DC | PRN
  Filled 2012-01-27: qty 1

## 2012-01-27 MED ORDER — SODIUM CHLORIDE 0.9 % IV SOLN
INTRAVENOUS | Status: AC
  Administered 2012-01-27: 18:00:00 via INTRAVENOUS
  Administered 2012-01-28: 75 mL/h via INTRAVENOUS

## 2012-01-27 MED ORDER — SODIUM CHLORIDE 0.9 % IV BOLUS (SEPSIS)
500.0000 mL | Freq: Once | INTRAVENOUS | Status: AC
  Administered 2012-01-27: 500 mL via INTRAVENOUS

## 2012-01-27 MED ORDER — PREDNISONE 10 MG PO TABS
10.0000 mg | ORAL_TABLET | Freq: Every day | ORAL | Status: DC
  Administered 2012-01-28 – 2012-01-29 (×2): 10 mg via ORAL
  Filled 2012-01-27 (×2): qty 1

## 2012-01-27 MED ORDER — SODIUM CHLORIDE 0.9 % IJ SOLN
3.0000 mL | Freq: Two times a day (BID) | INTRAMUSCULAR | Status: DC
  Administered 2012-01-27 – 2012-01-28 (×3): 3 mL via INTRAVENOUS

## 2012-01-27 MED ORDER — FLEET ENEMA 7-19 GM/118ML RE ENEM
1.0000 | ENEMA | Freq: Once | RECTAL | Status: AC | PRN
  Filled 2012-01-27: qty 1

## 2012-01-27 MED ORDER — SIMVASTATIN 10 MG PO TABS
10.0000 mg | ORAL_TABLET | Freq: Every day | ORAL | Status: DC
  Administered 2012-01-27 – 2012-01-28 (×2): 10 mg via ORAL
  Filled 2012-01-27 (×3): qty 1

## 2012-01-27 MED ORDER — IPRATROPIUM-ALBUTEROL 0.5-2.5 (3) MG/3ML IN SOLN: 3.0000 mL | Freq: Four times a day (QID) | RESPIRATORY_TRACT | Status: DC | PRN

## 2012-01-27 MED ORDER — BIMATOPROST 0.01 % OP SOLN
1.0000 [drp] | Freq: Every day | OPHTHALMIC | Status: DC
  Administered 2012-01-27 – 2012-01-28 (×2): 1 [drp] via OPHTHALMIC
  Filled 2012-01-27: qty 2.5

## 2012-01-27 MED ORDER — SODIUM CHLORIDE 0.9 % IV SOLN
1000.0000 mL | INTRAVENOUS | Status: DC
  Administered 2012-01-27: 1000 mL via INTRAVENOUS

## 2012-01-27 NOTE — ED Provider Notes (Signed)
History     CSN: [id number]  Arrival date & time 01/27/12  4098   First MD Initiated Contact with Patient 01/27/12 1015      Chief Complaint  Patient presents with  . Chest Pain    (Consider location/radiation/quality/duration/timing/severity/associated sxs/prior treatment) HPI Comments: Pt feels like she has a choking sensation in her chest.  At times she has had some cramping in each side of her chest.  Pt is normally on oxygen.  History of Lung CA (possibly, abnl nodule, no biopsy, treating with radiation)  Also with COPD.  Also has history of DVT.  No history of CAD.  Patient is a 66 y.o. female presenting with chest pain. The history is provided by the patient.  Chest Pain The chest pain began yesterday. Episode frequency: occuring every 15 minutes. The severity of the pain is moderate. The pain does not radiate. Exacerbated by: nothing. Primary symptoms include abdominal pain (Something is moving in her stomach.  No really painful.). Pertinent negatives for primary symptoms include no fever, no cough, no nausea and no vomiting.     Past Medical History  Diagnosis Date  . Leukocytosis   . Glaucoma(365)   . Urinary incontinence   . Dyslipidemia   . Osteopenia   . Anxiety   . Tremor   . Syncope   . Diabetes mellitus, type 2   . DVT (deep venous thrombosis), left     2011- treated /w coumadin   . Anemia   . Vitamin D deficiency   . Shortness of breath   . CKD (chronic kidney disease), stage II     removed R kidney- 2010- for Cancer, followed by Dr. Retta Diones  . Arthritis     R leg  . Hypertension     sees Dr. Eula Listen for PCP, denies ever having stress or ECHO, ?when & where she had  last  EKG  . Esophageal reflux   . COPD (chronic obstructive pulmonary disease)     uses O2-2 liters , continuously , followed by Dr. Marchelle Gearing  . Cancer     kidney  . Lung cancer     NSCLC    Past Surgical History  Procedure Date  . Right nephrectomy   . Back surgery     for  pinched nerve, 2011, here at [facility]  . Eye surgery     cataracts removed, ?IOL  . Flexible bronchoscopy 11/29/2011    Family History  Problem Relation Age of Onset  . Emphysema Father   . Cancer Sister     behind eye  . Cancer Brother     lung  . Cancer Brother     lung  . Cancer Brother     throat    History  Substance Use Topics  . Smoking status: Former Smoker -- 1.0 packs/day for 35 years    Types: Cigarettes    Quit date: 03/20/2004  . Smokeless tobacco: Never Used  . Alcohol Use: No    OB History    Grav Para Term Preterm Abortions TAB SAB Ect Mult Living                  Review of Systems  Constitutional: Negative for fever.  Respiratory: Negative for cough.   Cardiovascular: Positive for chest pain.  Gastrointestinal: Positive for abdominal pain (Something is moving in her stomach.  No really painful.). Negative for nausea and vomiting.    Allergies  Review of patient's allergies indicates no known allergies.  Home  Medications   Current Outpatient Rx  Name  Route  Sig  Dispense  Refill  . AFLURIA PRESERVATIVE FREE IM SUSP               . ALBUTEROL SULFATE HFA 108 (90 BASE) MCG/ACT IN AERS   Inhalation   Inhale 2 puffs into the lungs every 4 (four) hours as needed. For shortness of breath         . BIMATOPROST 0.01 % OP SOLN   Both Eyes   Place 1 drop into both eyes at bedtime.          Marland Kitchen BRINZOLAMIDE 1 % OP SUSP   Right Eye   Place 1 drop into the right eye 3 (three) times daily.         Marland Kitchen CALCIUM CARB-CHOLECALCIFEROL 500-400 MG-UNIT PO TABS   Oral   Take 2 tablets by mouth 2 times daily at 12 noon and 4 pm.          . CLONAZEPAM 1 MG PO TABS   Oral   Take 1 mg by mouth 2 (two) times daily as needed. For anxiety         . DILTIAZEM HCL ER COATED BEADS 180 MG PO CP24               . ERGOCALCIFEROL 50000 UNITS PO CAPS   Oral   Take 50,000 Units by mouth once a week. Take on Monday         . ESOMEPRAZOLE MAGNESIUM 40 MG  PO CPDR   Oral   Take 40 mg by mouth daily before breakfast.          . FUROSEMIDE 20 MG PO TABS   Oral   Take 20 mg by mouth daily as needed.         Marland Kitchen HYDROCODONE-ACETAMINOPHEN 5-500 MG PO TABS   Oral   Take 1 tablet by mouth every 6 (six) hours as needed. For pain         . HYDROXYZINE HCL 25 MG PO TABS   Oral   Take 25 mg by mouth 3 (three) times daily as needed. For anxiety         . IPRATROPIUM-ALBUTEROL 0.5-2.5 (3) MG/3ML IN SOLN   Nebulization   Take 3 mLs by nebulization 4 (four) times daily as needed. For shortness of breath         . LOVASTATIN 20 MG PO TABS   Oral   Take 20 mg by mouth at bedtime.         Marland Kitchen METFORMIN HCL 500 MG PO TABS   Oral   Take 500 mg by mouth daily with breakfast.          . PREDNISONE 10 MG PO TABS   Oral   Take 10 mg by mouth daily.          Marland Kitchen PROMETHAZINE HCL 25 MG PO TABS   Oral   Take 12.5 mg by mouth every 6 (six) hours as needed. For nausea         . TIOTROPIUM BROMIDE MONOHYDRATE 18 MCG IN CAPS   Inhalation   Place 18 mcg into inhaler and inhale daily with breakfast.            BP 121/67  Pulse 105  Temp 97.9 F (36.6 C) (Oral)  Resp 22  SpO2 97%  Physical Exam  Nursing note and vitals reviewed. Constitutional: She appears well-developed and well-nourished. No distress.  HENT:  Head: Normocephalic and atraumatic.  Right Ear: External ear normal.  Left Ear: External ear normal.  Eyes: Conjunctivae normal are normal. Right eye exhibits no discharge. Left eye exhibits no discharge. No scleral icterus.  Neck: Neck supple. No tracheal deviation present.  Cardiovascular: Regular rhythm and intact distal pulses.  Tachycardia present.   Pulmonary/Chest: Effort normal. No stridor. No respiratory distress. She has decreased breath sounds (diffusely). She has no wheezes. She has no rhonchi.  Abdominal: Soft. Bowel sounds are normal. She exhibits no distension. There is no tenderness. There is no rebound  and no guarding.  Musculoskeletal: She exhibits no edema and no tenderness.  Neurological: She is alert. She has normal strength. She displays tremor. No sensory deficit. Cranial nerve deficit:  no gross defecits noted. She exhibits normal muscle tone. She displays no seizure activity. Coordination normal.  Skin: Skin is warm and dry. No rash noted.  Psychiatric: She has a normal mood and affect.    ED Course  Procedures (including critical care time)  Rate: 104  Rhythm: sinus tachycardia  QRS Axis: normal  Intervals: normal  ST/T Wave abnormalities: normal  Conduction Disutrbances:none  Narrative Interpretation:   Old EKG Reviewed: none available  Labs Reviewed  CBC WITH DIFFERENTIAL - Abnormal; Notable for the following:    WBC 12.9 (*)     RBC 3.82 (*)     Neutro Abs 9.1 (*)     All other components within normal limits  COMPREHENSIVE METABOLIC PANEL - Abnormal; Notable for the following:    Glucose, Bld 153 (*)     Creatinine, Ser 1.14 (*)     Total Bilirubin 0.2 (*)     GFR calc non Af Amer 50 (*)     GFR calc Af Amer 58 (*)     All other components within normal limits  D-DIMER, QUANTITATIVE - Abnormal; Notable for the following:    D-Dimer, Quant 0.66 (*)     All other components within normal limits  URINALYSIS, ROUTINE W REFLEX MICROSCOPIC - Abnormal; Notable for the following:    Hgb urine dipstick TRACE (*)     All other components within normal limits  D-DIMER, QUANTITATIVE - Abnormal; Notable for the following:    D-Dimer, Quant 0.51 (*)     All other components within normal limits  PROTIME-INR  LIPASE, BLOOD  POCT I-STAT TROPONIN I  PROTIME-INR  APTT  URINE MICROSCOPIC-ADD ON  POCT I-STAT TROPONIN I   Dg Chest 2 View  01/27/2012  *RADIOLOGY REPORT*  Clinical Data: Chest pain  CHEST - 2 VIEW  Comparison: 11/29/2011  Findings: Cardiomediastinal silhouette is stable.  No acute infiltrate or pulmonary edema.  Again noted right upper lobe pulmonary nodules.   Bony thorax is stable.  IMPRESSION: No acute infiltrate or pulmonary edema.  Again noted right upper lobe pulmonary nodules.  Bony thorax is stable.   Original Report Authenticated By: Natasha Mead, M.D.      MDM  CP concerning for possible cardiac etiology.  PE, GERD also a consideration.  Initial tests not definitive for etiology although d dimer elevated.  Will proceed with CT to evaluate further.  IF negative would pursue further cardiac workup.  Considering her cardiac risk factors and general poor health, will plan on admission for further evaluation.        Celene Kras, MD 01/27/12 [phone]

## 2012-01-27 NOTE — Consult Note (Addendum)
Name: Melanie Best MRN: [id number] DOB: 07-29-1945    LOS: 0  Referring Provider:  Dr. Janee Morn, [facility] Reason for Referral:  Lung nodule  PULMONARY / CRITICAL CARE MEDICINE  HPI:  66 yo F with COPD, h/o renal cell carcinoma s/p nephrectomy and presumed stage 1 NSCLC who was admitted to the hospital with atypical chest pain.  This summer she was noted to have a RUL spiculated lesion concerning for malignancy.  Bronchoscopy with biopsy was attempted but non-diagnostic.  She was not felt to be a candidate for a transthoracic or surgical biopsy given her severe COPD and lesion location.  It was felt that this was likely an early stage NSCLC although metastatic disease from previous renal cell cancer was possible.  In either case she would potentially benefit radiation and she underwent stereotactic which she recently completed.  In the ED as part of her chest pain work-up CTA of the chest was performed which did not show PE, but did show a new LLL nodule with possible compression of a branch of the left PA.  No PE was noted.  Currently her chest pain is improved.  Her SOB is at baseline.  Past Medical History  Diagnosis Date  . Leukocytosis   . Glaucoma(365)   . Urinary incontinence   . Dyslipidemia   . Osteopenia   . Anxiety   . Tremor   . Syncope   . Diabetes mellitus, type 2   . DVT (deep venous thrombosis), left     2011- treated /w coumadin   . Anemia   . Vitamin D deficiency   . Shortness of breath   . CKD (chronic kidney disease), stage II     removed R kidney- 2010- for Cancer, followed by Dr. Retta Diones  . Arthritis     R leg  . Hypertension     sees Dr. Eula Listen for PCP, denies ever having stress or ECHO, ?when & where she had  last  EKG  . Esophageal reflux   . COPD (chronic obstructive pulmonary disease)     uses O2-2 liters , continuously , followed by Dr. Marchelle Gearing  . Cancer     kidney  . Lung cancer     NSCLC  . DM (diabetes mellitus) 01/27/2012  .  Hyperlipidemia 01/27/2012  . HTN (hypertension) 01/27/2012  . GERD (gastroesophageal reflux disease) 01/27/2012   Past Surgical History  Procedure Date  . Right nephrectomy   . Back surgery     for pinched nerve, 2011, here at [facility]  . Eye surgery     cataracts removed, ?IOL  . Flexible bronchoscopy 11/29/2011   Prior to Admission medications   Medication Sig Start Date End Date Taking? Authorizing Provider  albuterol (PROVENTIL HFA;VENTOLIN HFA) 108 (90 BASE) MCG/ACT inhaler Inhale 2 puffs into the lungs every 4 (four) hours as needed. For shortness of breath   Yes Historical Provider, MD  albuterol-ipratropium (COMBIVENT) 18-103 MCG/ACT inhaler Inhale 2 puffs into the lungs every 6 (six) hours as needed. For shortness of breath/wheezing   Yes Historical Provider, MD  bimatoprost (LUMIGAN) 0.01 % SOLN Place 1 drop into both eyes at bedtime.    Yes Historical Provider, MD  Calcium Carb-Cholecalciferol (CALCIUM 500 +D) 500-400 MG-UNIT TABS Take 1 tablet by mouth 2 times daily at 12 noon and 4 pm.    Yes Historical Provider, MD  clonazePAM (KLONOPIN) 1 MG tablet Take 1 mg by mouth 2 (two) times daily as needed. For anxiety   Yes  Historical Provider, MD  diltiazem (CARDIZEM CD) 180 MG 24 hr capsule Take 180 mg by mouth daily.  11/30/11  Yes Historical Provider, MD  dorzolamide (TRUSOPT) 2 % ophthalmic solution Place 1 drop into the right eye Three times a day. 01/03/12  Yes Historical Provider, MD  ergocalciferol (VITAMIN D2) 50000 UNITS capsule Take 50,000 Units by mouth once a week. Take on Monday   Yes Historical Provider, MD  esomeprazole (NEXIUM) 40 MG capsule Take 40 mg by mouth daily before breakfast.    Yes Historical Provider, MD  furosemide (LASIX) 20 MG tablet Take 20 mg by mouth daily as needed. For fluid retention   Yes Historical Provider, MD  HYDROcodone-acetaminophen (VICODIN) 5-500 MG per tablet Take 1-2 tablets by mouth every 6 (six) hours as needed. For pain   Yes Historical  Provider, MD  hydrOXYzine (ATARAX/VISTARIL) 25 MG tablet Take 25 mg by mouth every 8 (eight) hours as needed. For itching   Yes Historical Provider, MD  ipratropium-albuterol (DUONEB) 0.5-2.5 (3) MG/3ML SOLN Take 3 mLs by nebulization 4 (four) times daily as needed. For shortness of breath   Yes Historical Provider, MD  lovastatin (MEVACOR) 20 MG tablet Take 20 mg by mouth at bedtime.   Yes Historical Provider, MD  metFORMIN (GLUCOPHAGE) 500 MG tablet Take 500 mg by mouth daily with breakfast.    Yes Historical Provider, MD  prednisoLONE acetate (PRED FORTE) 1 % ophthalmic suspension Apply 1 drop to eye daily as needed. For acute iritis   Yes Historical Provider, MD  predniSONE (DELTASONE) 10 MG tablet Take 10 mg by mouth daily.    Yes Historical Provider, MD  promethazine (PHENERGAN) 25 MG tablet Take 25 mg by mouth every 6 (six) hours as needed. For nausea   Yes Historical Provider, MD  tiotropium (SPIRIVA) 18 MCG inhalation capsule Place 18 mcg into inhaler and inhale daily with breakfast.    Yes Historical Provider, MD   Allergies No Known Allergies  Family History Family History  Problem Relation Age of Onset  . Emphysema Father   . Cancer Sister     behind eye  . Cancer Brother     lung  . Cancer Brother     lung  . Cancer Brother     throat   Social History  reports that she quit smoking about 7 years ago. Her smoking use included Cigarettes. She has a 35 pack-year smoking history. She has never used smokeless tobacco. She reports that she does not drink alcohol or use illicit drugs.  Review Of Systems:  Review of 10 systems is negative except as listed in HPI.  Brief patient description:  66 yo F with severe COPD, h/o RCC and recent completion of stereotactic radiation for presumed NSCLC with new LLL lesion.  Current Status:  Vital Signs: Temp:  [97.9 F (36.6 C)-98.1 F (36.7 C)] 98.1 F (36.7 C) (11/09 1748) Pulse Rate:  [78-105] 79  (11/09 1748) Resp:  [17-28] 22   (11/09 1748) BP: (99-121)/(55-73) 108/66 mmHg (11/09 1748) SpO2:  [97 %-100 %] 100 % (11/09 2021) Weight:  [82.555 kg (182 lb)] 82.555 kg (182 lb) (11/09 1748)  Physical Examination: General:  Appears comfortable in bed Neuro:  Awake, alert, moves all 4 extremities HEENT:  Sclera clear, EOMI, PER, MMM, no oral lesions Neck:  Supple. No cervical or supraclavicular LAD, no thyromegaly Cardiovascular:  RRR, no m/r/g Lungs:  CTAB, no wheeze or rales Abdomen:  Soft, NT, ND, normal BS Musculoskeletal:  Joints wnl  Skin:  No rash of lesions  Principal Problem:  *Chest pain Active Problems:  COPD, very severe  Leukocytosis  DM (diabetes mellitus)  Hyperlipidemia  CKD (chronic kidney disease), stage II  HTN (hypertension)  Anxiety  GERD (gastroesophageal reflux disease)  Lung cancer  CT ANGIOGRAPHY CHEST  Technique: Multidetector CT imaging of the chest using the  standard protocol during bolus administration of intravenous  contrast. Multiplanar reconstructed images including MIPs were  obtained and reviewed to evaluate the vascular anatomy.  Contrast: OMNIPAQUE IOHEXOL 350 MG/ML SOLN  Comparison: Radiography same day. CT 11/15/2011  Findings: Pulmonary arterial opacification is excellent. There are  no emboli on the right. In the left lower lobe, there is a.  Vascular nodule measuring 15 mm in diameter. Extending out from  that, there is poor filling of the left lower lobe pulmonary  arterial branch. This could represent tumor thrombus or could  possibly just represent a poor filling because of extrinsic  restriction of flow to that vessel. I do not see a pattern likely  represent embolic disease from outside of the lungs.  There is pronounced emphysema within the upper lungs. 5 mm nodule  in the central right upper lobe is unchanged. Spiculated density  posterior to that in the right upper lobe is unchanged measuring  approximately 16 x 9 mm. Nodule at the left base is  again noted,  measured at 6 mm today. One could argue that this is 1 mm larger.  Scans in the upper abdomen do not show any definable lesion. There  are a few small retrocrural nodes but these appear unchanged.  IMPRESSION:  I do not think there are any emboli in the pulmonary arterial tree  from peripheral thrombosis. There is a 15 mm pulmonary nodule  adjacent to the left lower lobe pulmonary arterial branch which  causes some extrinsic compression of the left lower lobe pulmonary  arterial branch. There is what appears to be a filling defect  extending out from that, which could be tumor thrombus.  Alternatively, this could represent diminished filling because of  the extrinsic constriction of the vessel and reduced flow. This  nodule has enlarged and is likely to represent a metastasis.  6 mm nodule on the left lower lobe just adjacent to the diaphragm,  possibly a millimeter larger when compared to the previous study.  Pulmonary nodule and spiculated mass in the right upper lobe have  not changed since the previous exam.  Original Report Authenticated By: Paulina Fusi, M.D.    ASSESSMENT AND PLAN  66 yo F with severe COPD, h/o RCC and recent completion of stereotactic radiation for presumed NSCLC with new LLL lesion.  New LLL could be metastatic disease from primary lung cancer or from metastatic RCC (as initial lung lesion could be from).  This lesion is unlikely the cause of her chest pain.  Location of lesion and patient lung function make this not amenable to biopsy.  She has close follow-up already arranged with both her radiation oncologist and pulmonologist including repeat imaging planned in 1 month.  She should discuss whether to serial CT or any additional diagnostic work-up can be performed with her outpatient physicians.  Will sign off but contact us with any additional questions.  Tellis Spivak, M.D. Pulmonary and Critical Care Medicine [facility] Pager: [phone]  01/27/2012, 8:40 PM

## 2012-01-27 NOTE — ED Notes (Signed)
Patient transported to CT 

## 2012-01-27 NOTE — ED Notes (Signed)
Attempted report to 3 W. 

## 2012-01-27 NOTE — ED Notes (Signed)
Admitting MD at bedside.

## 2012-01-27 NOTE — ED Notes (Signed)
C/o intermittent midsternal non radiating CP onset yesterday at 1100 after eating bologna. Denies n/v, diaphoresis, fever, cold, cough. C/o SOB with CP, "it takes my breath away & feels like its choking me". States CP occurs approx every 15 min & lasts a few seconds at a time. Did not feel any CP last night while sleeping but returned upon awakening.

## 2012-01-27 NOTE — Progress Notes (Addendum)
ANTICOAGULATION CONSULT NOTE - Initial Consult  Pharmacy Consult for Enoxaparin   Indication: chest pain/ACS/ NSTEMI  No Known Allergies   Vital Signs: Temp: 97.9 F (36.6 C) (11/09 0940) Temp src: Oral (11/09 0940) BP: 99/61 mmHg (11/09 1300) Pulse Rate: 81  (11/09 1300)  Labs:  Basename 01/27/12 1237 01/27/12 1030 01/27/12 0939  HGB -- -- 12.1  HCT -- -- 38.0  PLT -- -- 268  APTT 28 -- --  LABPROT 12.9 12.5 --  INR 0.98 0.94 --  HEPARINUNFRC -- -- --  CREATININE -- -- 1.14*  CKTOTAL -- -- --  CKMB -- -- --  TROPONINI -- -- --    The CrCl is unknown because both a height and weight (above a minimum accepted value) are required for this calculation.   Medical History: Past Medical History  Diagnosis Date  . Leukocytosis   . Glaucoma(365)   . Urinary incontinence   . Dyslipidemia   . Osteopenia   . Anxiety   . Tremor   . Syncope   . Diabetes mellitus, type 2   . DVT (deep venous thrombosis), left     2011- treated /w coumadin   . Anemia   . Vitamin D deficiency   . Shortness of breath   . CKD (chronic kidney disease), stage II     removed R kidney- 2010- for Cancer, followed by Dr. Retta Diones  . Arthritis     R leg  . Hypertension     sees Dr. Eula Listen for PCP, denies ever having stress or ECHO, ?when & where she had  last  EKG  . Esophageal reflux   . COPD (chronic obstructive pulmonary disease)     uses O2-2 liters , continuously , followed by Dr. Marchelle Gearing  . Cancer     kidney  . Lung cancer     NSCLC    Assessment: 66 year old female w/ cc of non-radiating, intermittent chest pain. Cardiac enzymes negative. Pharmacy consulted to start Lovenox.  Pt states she weighs 182 lbs and ht of 5'6". Based on adjusted body weight (69kg), crcl = 2ml/min. CBC ok   Goal of Therapy:  Monitor platelets by anticoagulation protocol: Yes   Plan:  1) Lovenox sq 1mg /kg q12h  2) CBC every 3 days   Thank you,  Franchot Erichsen, Pharm.D. Clinical Pharmacist    01/27/2012 3:14 PM

## 2012-01-27 NOTE — H&P (Signed)
[facility] History and Physical  Melanie Best AVW:[id number] DOB: 1946-03-15 DOA: 01/27/2012  Referring physician: Dr. Iantha Fallen PCP: Georgann Housekeeper, MD  Specialists: None  Chief Complaint: Chest pain  HPI: Melanie Best is a 66 y.o. female African American female with history of type 2 diabetes, hypertension, hyperlipidemia, chronic kidney disease stage II status post right nephrectomy in 2010 secondary to probable renal cell carcinoma, prior history of tobacco abuse, prior history of DVT left lower extremity, history of lung cancer( [facility]) /pulm nodule status post radiation therapy last treatment 2 weeks prior to admission, gastroesophageal reflux disease, history of COPD oxygen dependent since 2004 (Gold stage 3-4, FEV1 0.8 L/34%, ratio 40, TLC 110%, DLCO 35% in May of 2013) who presents to the ED with a one to two-day history of mid sternal chest pain which he stated happened after she 8 some bologna. This that after that bother her during the test which was like it took in sensation which is intermittent occurring every 15-20 minutes. Patient said that the chest and feels like something is going to choke her. Patient denies any radiation of the chest pain. Patient denies any change in her chronic shortness of breath, no burning sensation, no palpitations, no diaphoresis, no nausea, no vomiting, no abdominal pain, no diarrhea, no constipation, no dysuria, no fever, no chills, no family history of premature coronary artery disease. Patient also complaining of some right-sided abdominal pain and left-sided pain under the left breast. Patient also stating that she feels like something has been moving and the stomach over the past month. Patient was seen in the ED d-dimer obtained was slightly elevated at 0.51. INR of 0.98. Urinalysis is negative. First set of troponin was 0.00. EKG showed an sinus tachycardia, comprehensive metabolic profile with a creatinine of 1.14 otherwise was  unremarkable. Lipase level of 17. CBC had a white count of 12.9. Chest x-ray was negative. Will call to admit the patient for further evaluation and management. CT angiography chest is pending which was ordered per ED doctor rule out PE.  Review of Systems: The patient denies anorexia, fever, weight loss,, vision loss, decreased hearing, hoarseness, chest pain, syncope, dyspnea on exertion, peripheral edema, balance deficits, hemoptysis, abdominal pain, melena, hematochezia, severe indigestion/heartburn, hematuria, incontinence, genital sores, muscle weakness, suspicious skin lesions, transient blindness, difficulty walking, depression, unusual weight change, abnormal bleeding, enlarged lymph nodes, angioedema, and breast masses.    Past Medical History  Diagnosis Date  . Leukocytosis   . Glaucoma(365)   . Urinary incontinence   . Dyslipidemia   . Osteopenia   . Anxiety   . Tremor   . Syncope   . Diabetes mellitus, type 2   . DVT (deep venous thrombosis), left     2011- treated /w coumadin   . Anemia   . Vitamin D deficiency   . Shortness of breath   . CKD (chronic kidney disease), stage II     removed R kidney- 2010- for Cancer, followed by Dr. Retta Diones  . Arthritis     R leg  . Hypertension     sees Dr. Eula Listen for PCP, denies ever having stress or ECHO, ?when & where she had  last  EKG  . Esophageal reflux   . COPD (chronic obstructive pulmonary disease)     uses O2-2 liters , continuously , followed by Dr. Marchelle Gearing  . Cancer     kidney  . Lung cancer     NSCLC  . DM (diabetes mellitus) 01/27/2012  .  Hyperlipidemia 01/27/2012  . HTN (hypertension) 01/27/2012  . GERD (gastroesophageal reflux disease) 01/27/2012   Past Surgical History  Procedure Date  . Right nephrectomy   . Back surgery     for pinched nerve, 2011, here at [facility]  . Eye surgery     cataracts removed, ?IOL  . Flexible bronchoscopy 11/29/2011   Social History:  reports that she quit smoking about 7 years  ago. Her smoking use included Cigarettes. She has a 35 pack-year smoking history. She has never used smokeless tobacco. She reports that she does not drink alcohol or use illicit drugs.   No Known Allergies  Family History  Problem Relation Age of Onset  . Emphysema Father   . Cancer Sister     behind eye  . Cancer Brother     lung  . Cancer Brother     lung  . Cancer Brother     throat     Prior to Admission medications   Medication Sig Start Date End Date Taking? Authorizing Provider  albuterol (PROVENTIL HFA;VENTOLIN HFA) 108 (90 BASE) MCG/ACT inhaler Inhale 2 puffs into the lungs every 4 (four) hours as needed. For shortness of breath   Yes Historical Provider, MD  albuterol-ipratropium (COMBIVENT) 18-103 MCG/ACT inhaler Inhale 2 puffs into the lungs every 6 (six) hours as needed. For shortness of breath/wheezing   Yes Historical Provider, MD  bimatoprost (LUMIGAN) 0.01 % SOLN Place 1 drop into both eyes at bedtime.    Yes Historical Provider, MD  Calcium Carb-Cholecalciferol (CALCIUM 500 +D) 500-400 MG-UNIT TABS Take 1 tablet by mouth 2 times daily at 12 noon and 4 pm.    Yes Historical Provider, MD  clonazePAM (KLONOPIN) 1 MG tablet Take 1 mg by mouth 2 (two) times daily as needed. For anxiety   Yes Historical Provider, MD  diltiazem (CARDIZEM CD) 180 MG 24 hr capsule Take 180 mg by mouth daily.  11/30/11  Yes Historical Provider, MD  dorzolamide (TRUSOPT) 2 % ophthalmic solution Place 1 drop into the right eye Three times a day. 01/03/12  Yes Historical Provider, MD  ergocalciferol (VITAMIN D2) 50000 UNITS capsule Take 50,000 Units by mouth once a week. Take on Monday   Yes Historical Provider, MD  esomeprazole (NEXIUM) 40 MG capsule Take 40 mg by mouth daily before breakfast.    Yes Historical Provider, MD  furosemide (LASIX) 20 MG tablet Take 20 mg by mouth daily as needed. For fluid retention   Yes Historical Provider, MD  HYDROcodone-acetaminophen (VICODIN) 5-500 MG per tablet  Take 1-2 tablets by mouth every 6 (six) hours as needed. For pain   Yes Historical Provider, MD  hydrOXYzine (ATARAX/VISTARIL) 25 MG tablet Take 25 mg by mouth every 8 (eight) hours as needed. For itching   Yes Historical Provider, MD  ipratropium-albuterol (DUONEB) 0.5-2.5 (3) MG/3ML SOLN Take 3 mLs by nebulization 4 (four) times daily as needed. For shortness of breath   Yes Historical Provider, MD  lovastatin (MEVACOR) 20 MG tablet Take 20 mg by mouth at bedtime.   Yes Historical Provider, MD  metFORMIN (GLUCOPHAGE) 500 MG tablet Take 500 mg by mouth daily with breakfast.    Yes Historical Provider, MD  prednisoLONE acetate (PRED FORTE) 1 % ophthalmic suspension Apply 1 drop to eye daily as needed. For acute iritis   Yes Historical Provider, MD  predniSONE (DELTASONE) 10 MG tablet Take 10 mg by mouth daily.    Yes Historical Provider, MD  promethazine (PHENERGAN) 25 MG tablet Take  25 mg by mouth every 6 (six) hours as needed. For nausea   Yes Historical Provider, MD  tiotropium (SPIRIVA) 18 MCG inhalation capsule Place 18 mcg into inhaler and inhale daily with breakfast.    Yes Historical Provider, MD   Physical Exam: Filed Vitals:   01/27/12 1130 01/27/12 1230 01/27/12 1300 01/27/12 1453  BP: 116/60 116/73 99/61 103/57  Pulse: 93 98 81 83  Temp:      TempSrc:      Resp: 18 28 17 17   SpO2: 99% 99% 99% 99%     General:  Well-developed well-nourished with a tremor in no acute cardiopulmonary distress  Eyes: Pupils equal round and reactive to light and accommodation. Extraocular movements intact.  ENT: Oropharynx is clear, no lesions, no exudates.  Neck: Supple with no lymphadenopathy  Cardiovascular: Regular rate rhythm no murmurs rubs or gallops. No JVD. No lower extremity edema.  Respiratory: Clear to auscultation bilaterally. No wheezing, no rhonchi, no crackles  Abdomen: Soft, nontender, nondistended, positive bowel sounds  Skin: No rashes or lesions  Musculoskeletal: 5 out  of 5 bilateral upper extremity strength. 5 out of 5 bilateral lower extremity strength.  Psychiatric: Normal mood. Normal affect. Fair judgment. Fair insight.  Neurologic: Tremors. Alert and oriented x3. Cranial nerves II through XII are grossly intact. No focal deficits. Sensation is intact. Visual fields are intact. Gait not tested secondary to safety.  Labs on Admission:  Basic Metabolic Panel:  Lab 01/27/12 1610  NA 139  K 3.5  CL 98  CO2 27  GLUCOSE 153*  BUN 15  CREATININE 1.14*  CALCIUM 8.9  MG --  PHOS --   Liver Function Tests:  Lab 01/27/12 0939  AST 16  ALT 13  ALKPHOS 94  BILITOT 0.2*  PROT 7.2  ALBUMIN 3.8    Lab 01/27/12 1030  LIPASE 17  AMYLASE --   No results found for this basename: AMMONIA:5 in the last 168 hours CBC:  Lab 01/27/12 0939  WBC 12.9*  NEUTROABS 9.1*  HGB 12.1  HCT 38.0  MCV 99.5  PLT 268   Cardiac Enzymes: No results found for this basename: CKTOTAL:5,CKMB:5,CKMBINDEX:5,TROPONINI:5 in the last 168 hours  BNP (last 3 results) No results found for this basename: PROBNP:3 in the last 8760 hours CBG: No results found for this basename: GLUCAP:5 in the last 168 hours  Radiological Exams on Admission: Dg Chest 2 View  01/27/2012  *RADIOLOGY REPORT*  Clinical Data: Chest pain  CHEST - 2 VIEW  Comparison: 11/29/2011  Findings: Cardiomediastinal silhouette is stable.  No acute infiltrate or pulmonary edema.  Again noted right upper lobe pulmonary nodules.  Bony thorax is stable.  IMPRESSION: No acute infiltrate or pulmonary edema.  Again noted right upper lobe pulmonary nodules.  Bony thorax is stable.   Original Report Authenticated By: Natasha Mead, M.D.     EKG: Sinus tachycardia  Assessment/Plan Principal Problem:  *Chest pain Active Problems:  COPD, very severe  Leukocytosis  DM (diabetes mellitus)  Hyperlipidemia  CKD (chronic kidney disease), stage II  HTN (hypertension)  Anxiety  GERD (gastroesophageal reflux  disease)  Lung cancer   #1 chest pain Patient with chest pain which sounds atypical. Patient points data chest pain is midsternal it seems more in the epigastric region. Patient's chest pain is intermittent in nature. Patient does have multiple risk factors including diabetes hypertension hyperlipidemia prior history of tobacco abuse COPD. Patient also does have a history of probable non-small cell lung cancer status post  radiation therapy and also a history of left lower extremity DVT and a such is at risk for PE. Will admit the patient to telemetry and cycle cardiac enzymes every 6 hours x3. Will check a 2-D echo. We'll check a fasting lipid panel. CT angiography chest is pending per ED physician. We'll place on aspirin. Oxygen. Follow. If cardiac workup is negative and PE workup was negative may consider GI sources of her chest pain. Patient may need a barium esophagram versus GI consult for further evaluation and management. Follow.  #2 leukocytosis Likely secondary to chronic steroid use. Chest x-ray is negative for any infiltrate. Will check a urinalysis. Follow. No need for antibiotics at this time.  #3 type 2 diabetes Check a hemoglobin A1c. Hold oral hypoglycemics. Place on sliding scale insulin.  #4 hyperlipidemia Check a fasting lipid panel. Continue home dose statin.  #5 gastroesophageal reflux disease PPI  #6 hypertension Will resume her home regimen of diltiazem.  #7 COPD oxygen dependent [GOLD stage 3-4] Continue oxygen, Spiriva, albuterol inhaler when necessary, albuterol nebs. Follow.  #8 chronic kidney disease status post right nephrectomy 2010 Stable.  #9 pulmonary nodules/probable non-small cell lung cancer Status post radiation treatment. Followup with radiation oncology as outpatient  #10 anxiety Klonopin as needed.  #11 prophylaxis PPI for GI prophylaxis. Lovenox for DVT prophylaxis.   Code Status: Full Family Communication: Updated patient at  bedside. Disposition Plan: Admit to telemetry under observation  Time spent: 70 mins  [facility] Pager [phone]  If 7PM-7AM, please contact night-coverage [email or web address] Password [facility] 01/27/2012, 3:43 PM

## 2012-01-27 NOTE — ED Notes (Signed)
Pt presents to department for evaluation of intermittent non radiating midsternal chest pain. Onset Friday afternoon. Pt states "I feel like their is something in my chest and it is choking me." 5/10 dull pain at the time. Also states "I feel like something is moving in my stomach." history of lung cancer, wears 2L 02 at home. Respirations unlabored. She is alert and oriented x4.

## 2012-01-28 LAB — BASIC METABOLIC PANEL
Calcium: 9.2 mg/dL (ref 8.4–10.5)
Creatinine, Ser: 1.2 mg/dL — ABNORMAL HIGH (ref 0.50–1.10)
GFR calc Af Amer: 54 mL/min — ABNORMAL LOW (ref >90)
GFR calc non Af Amer: 47 mL/min — ABNORMAL LOW (ref >90)

## 2012-01-28 LAB — CBC WITH DIFFERENTIAL/PLATELET
Basophils Absolute: 0 10*3/uL (ref 0.0–0.1)
Basophils Relative: 0 % (ref 0–1)
Eosinophils Absolute: 0.1 10*3/uL (ref 0.0–0.7)
Eosinophils Relative: 1 % (ref 0–5)
HCT: 36.1 % (ref 36.0–46.0)
Hemoglobin: 11.1 g/dL — ABNORMAL LOW (ref 12.0–15.0)
Lymphocytes Relative: 29 % (ref 12–46)
Neutrophils Relative %: 64 % (ref 43–77)
RBC: 3.58 MIL/uL — ABNORMAL LOW (ref 3.87–5.11)

## 2012-01-28 LAB — LIPID PANEL
LDL Cholesterol: 76 mg/dL (ref 0–99)
Total CHOL/HDL Ratio: 3.3 RATIO
VLDL: 47 mg/dL — ABNORMAL HIGH (ref 0–40)

## 2012-01-28 LAB — HEPATIC FUNCTION PANEL
ALT: 12 U/L (ref 0–35)
Albumin: 3.5 g/dL (ref 3.5–5.2)
Alkaline Phosphatase: 88 U/L (ref 39–117)
Total Protein: 6.6 g/dL (ref 6.0–8.3)

## 2012-01-28 LAB — TSH: TSH: 0.321 u[IU]/mL — ABNORMAL LOW (ref 0.350–4.500)

## 2012-01-28 LAB — CK TOTAL AND CKMB (NOT AT ARMC)
CK, MB: 3.9 ng/mL (ref 0.3–4.0)
CK, MB: 4.1 ng/mL — ABNORMAL HIGH (ref 0.3–4.0)
Relative Index: 1.8 (ref 0.0–2.5)
Total CK: 221 U/L — ABNORMAL HIGH (ref 7–177)
Total CK: 211 U/L — ABNORMAL HIGH (ref 7–177)

## 2012-01-28 LAB — MRSA PCR SCREENING: MRSA by PCR: POSITIVE — AB

## 2012-01-28 LAB — GLUCOSE, CAPILLARY: Glucose-Capillary: 118 mg/dL — ABNORMAL HIGH (ref 70–99)

## 2012-01-28 MED ORDER — MUPIROCIN 2 % EX OINT
1.0000 "application " | TOPICAL_OINTMENT | Freq: Two times a day (BID) | CUTANEOUS | Status: DC
  Filled 2012-01-28: qty 22

## 2012-01-28 MED ORDER — CHLORHEXIDINE GLUCONATE CLOTH 2 % EX PADS
6.0000 | MEDICATED_PAD | Freq: Every day | CUTANEOUS | Status: DC
  Administered 2012-01-28: 6 via TOPICAL

## 2012-01-28 MED ORDER — CHLORHEXIDINE GLUCONATE CLOTH 2 % EX PADS: 6.0000 | MEDICATED_PAD | Freq: Every day | CUTANEOUS | Status: DC

## 2012-01-28 MED ORDER — ALBUTEROL SULFATE (5 MG/ML) 0.5% IN NEBU
2.5000 mg | INHALATION_SOLUTION | Freq: Four times a day (QID) | RESPIRATORY_TRACT | Status: DC
  Administered 2012-01-28 – 2012-01-29 (×5): 2.5 mg via RESPIRATORY_TRACT
  Filled 2012-01-28 (×5): qty 0.5

## 2012-01-28 MED ORDER — ENOXAPARIN SODIUM 80 MG/0.8ML ~~LOC~~ SOLN
80.0000 mg | Freq: Two times a day (BID) | SUBCUTANEOUS | Status: DC
  Administered 2012-01-28 – 2012-01-29 (×2): 80 mg via SUBCUTANEOUS
  Filled 2012-01-28 (×4): qty 0.8

## 2012-01-28 MED ORDER — ALBUTEROL SULFATE (5 MG/ML) 0.5% IN NEBU: 2.5000 mg | INHALATION_SOLUTION | RESPIRATORY_TRACT | Status: DC | PRN

## 2012-01-28 MED ORDER — ALUM & MAG HYDROXIDE-SIMETH 200-200-20 MG/5ML PO SUSP
30.0000 mL | Freq: Once | ORAL | Status: AC
  Administered 2012-01-28: 30 mL via ORAL

## 2012-01-28 MED ORDER — IPRATROPIUM BROMIDE 0.02 % IN SOLN: 0.5000 mg | Freq: Four times a day (QID) | RESPIRATORY_TRACT | Status: DC | PRN

## 2012-01-28 MED ORDER — MUPIROCIN 2 % EX OINT
1.0000 "application " | TOPICAL_OINTMENT | Freq: Two times a day (BID) | CUTANEOUS | Status: DC
  Administered 2012-01-28 (×3): 1 via NASAL
  Filled 2012-01-28: qty 22

## 2012-01-28 NOTE — Progress Notes (Signed)
Lab report +MRSA PCR placed on orange contact precaution.

## 2012-01-28 NOTE — Progress Notes (Signed)
Utilization review completed.  

## 2012-01-28 NOTE — Progress Notes (Signed)
Assessment/Plan: Principal Problem:  *Chest pain - ? Etiology. Will try some antacids today to see if it makes any difference. Considered d/c but she would like to see effect and then decide.  Active Problems:  COPD, very severe  Leukocytosis  DM (diabetes mellitus)  Hyperlipidemia  CKD (chronic kidney disease), stage II  HTN (hypertension)  Anxiety  GERD (gastroesophageal reflux disease)  Lung cancer, presumed - new nodule will need followup with rad onc and pulm.    Subjective: Continues to have a few seconds of pain every 15-20 minutes. No change in chronic dyspnea. Is on PPI. Has not tried antacid  Objective:  Vital Signs: Filed Vitals:   01/27/12 2021 01/27/12 2100 01/28/12 0500 01/28/12 0557  BP:  107/61 103/66   Pulse:  80 86   Temp:  98 F (36.7 C) 97.3 F (36.3 C)   TempSrc:      Resp:  18 18   Height:      Weight:      SpO2: 100% 99% 100% 100%     EXAM: comfortable lying in bed   Intake/Output Summary (Last 24 hours) at 01/28/12 0833 Last data filed at 01/28/12 0700  Gross per 24 hour  Intake 1477.5 ml  Output      0 ml  Net 1477.5 ml    Lab Results:  Basename 01/28/12 0410 01/27/12 1834 01/27/12 0939  NA 145 -- 139  K 3.8 -- 3.5  CL 106 -- 98  CO2 28 -- 27  GLUCOSE 121* -- 153*  BUN 17 -- 15  CREATININE 1.20* 1.33* --  CALCIUM 9.2 -- 8.9  MG -- 2.0 --  PHOS -- -- --    Basename 01/28/12 0410 01/27/12 0939  AST 13 16  ALT 12 13  ALKPHOS 88 94  BILITOT 0.1* 0.2*  PROT 6.6 7.2  ALBUMIN 3.5 3.8    Basename 01/27/12 1834 01/27/12 1030  LIPASE 18 17  AMYLASE -- --    Basename 01/28/12 0410 01/27/12 1834 01/27/12 0939  WBC 10.6* 13.3* --  NEUTROABS 6.8 -- 9.1*  HGB 11.1* 11.8* --  HCT 36.1 37.4 --  MCV 100.8* 100.8* --  PLT 271 264 --    Basename 01/28/12 0410 01/27/12 2245 01/27/12 1833  CKTOTAL 221* 223* 233*  CKMB 3.9 4.0 4.2*  CKMBINDEX -- -- --  TROPONINI <0.30 <0.30 <0.30   No components found with this basename:  POCBNP:3  Basename 01/27/12 1237 01/27/12 1030  DDIMER 0.51* 0.66*   No results found for this basename: HGBA1C:2 in the last 72 hours  Basename 01/28/12 0410  CHOL 177  HDL 54  LDLCALC 76  TRIG 234*  CHOLHDL 3.3  LDLDIRECT --    Basename 01/27/12 1834  TSH 0.321*  T4TOTAL --  T3FREE --  THYROIDAB --   No results found for this basename: VITAMINB12:2,FOLATE:2,FERRITIN:2,TIBC:2,IRON:2,RETICCTPCT:2 in the last 72 hours  Studies/Results: Dg Chest 2 View  01/27/2012  *RADIOLOGY REPORT*  Clinical Data: Chest pain  CHEST - 2 VIEW  Comparison: 11/29/2011  Findings: Cardiomediastinal silhouette is stable.  No acute infiltrate or pulmonary edema.  Again noted right upper lobe pulmonary nodules.  Bony thorax is stable.  IMPRESSION: No acute infiltrate or pulmonary edema.  Again noted right upper lobe pulmonary nodules.  Bony thorax is stable.   Original Report Authenticated By: Natasha Mead, M.D.    Ct Angio Chest Pe W/cm &/or Wo Cm  01/27/2012  *RADIOLOGY REPORT*  Clinical Data: Chest pain.  Short of breath.  CT ANGIOGRAPHY CHEST  Technique:  Multidetector CT imaging of the chest using the standard protocol during bolus administration of intravenous contrast. Multiplanar reconstructed images including MIPs were obtained and reviewed to evaluate the vascular anatomy.  Contrast: OMNIPAQUE IOHEXOL 350 MG/ML SOLN  Comparison: Radiography same day.  CT 11/15/2011  Findings: Pulmonary arterial opacification is excellent. There are no emboli on the right.  In the left lower lobe, there is a. Vascular nodule measuring 15 mm in diameter.  Extending out from that, there is poor filling of the left lower lobe pulmonary arterial branch.  This could represent tumor thrombus or could possibly just represent a poor filling because of extrinsic restriction of flow to that vessel.  I do not see a pattern likely represent embolic disease from outside of the lungs.  There is pronounced emphysema within the upper  lungs.  5 mm nodule in the central right upper lobe is unchanged.  Spiculated density posterior to that in the right upper lobe is unchanged measuring approximately 16 x 9 mm.  Nodule at the left base is again noted, measured at 6 mm today. One could argue that this is 1 mm larger.  Scans in the upper abdomen do not show any definable lesion.  There are a few small retrocrural nodes but these appear unchanged.  IMPRESSION: I do not think there are any emboli in the pulmonary arterial tree from peripheral thrombosis.  There is a 15 mm pulmonary nodule adjacent to the left lower lobe pulmonary arterial branch which causes some extrinsic compression of the left lower lobe pulmonary arterial branch.  There is what appears to be a filling defect extending out from that, which could be tumor thrombus. Alternatively, this could represent diminished filling because of the extrinsic constriction of the vessel and reduced flow.  This nodule has enlarged and is likely to represent a metastasis.  6 mm nodule on the left lower lobe just adjacent to the diaphragm, possibly a millimeter larger when compared to the previous study.  Pulmonary nodule and spiculated mass in the right upper lobe have not changed since the previous exam.   Original Report Authenticated By: Paulina Fusi, M.D.    Medications: Medications administered in the last 24 hours reviewed.  Current Medication List reviewed.    LOS: 1 day   [facility] @ [facility] [phone]) 01/28/2012, 8:33 AM

## 2012-01-29 LAB — CK TOTAL AND CKMB (NOT AT ARMC)
CK, MB: 4.1 ng/mL — ABNORMAL HIGH (ref 0.3–4.0)
Relative Index: 1.7 (ref 0.0–2.5)
Total CK: 240 U/L — ABNORMAL HIGH (ref 7–177)

## 2012-01-29 LAB — GLUCOSE, CAPILLARY: Glucose-Capillary: 107 mg/dL — ABNORMAL HIGH (ref 70–99)

## 2012-01-29 MED ORDER — ALUM & MAG HYDROXIDE-SIMETH 200-200-20 MG/5ML PO SUSP: 30.0000 mL | Freq: Four times a day (QID) | ORAL | Status: DC | PRN

## 2012-01-29 NOTE — Discharge Summary (Signed)
Physician Discharge Summary  Patient ID: Melanie Best MRN: [id number] DOB/AGE: [birth year] 66 y.o.  Admit date: 01/27/2012 Discharge date: 01/29/2012  Admission Diagnoses:  Discharge Diagnoses:  Principal Problem:  *Chest pain ;rule out for MI, negative for PE History of lung cancer with lung nodules on CT scan GERD Active Problems:  COPD, very severe  Leukocytosis  DM (diabetes mellitus)  Hyperlipidemia  CKD (chronic kidney disease), stage II  HTN (hypertension)  Anxiety  GERD (gastroesophageal reflux disease)  Lung cancer   Discharged Condition: good  Hospital Course: 66 year old female with history of severe COPD, lung cancer history of radiation and lung nodule followed by radiation oncologist history of diabetes hypertension GERD, presents with atypical chest pain. Problem #1 chest pain which was in the telemetry, ruled out for MI by the negative enzymes. Chest x-ray COPD, she had a d-dimer done in the emergency room which was elevated. She underwent a CT angle which was negative for PE but showed multiple lung nodules 2 of them were stable one of them probably larger from the previous. This was not thought to be causing the pains. Patient's chest and got relief with antacids Her telemetry was normal sinus EKG was unremarkable Problem #2 history of lung cancer with status post radiation. Patient has CT scan shows some lung nodules with possibly one of them slightly larger, she has an outpatient followup with radiation oncologist Dr. Michell Heinrich, in about a month; have him followup patient Problem #3; severe COPD O2 dependent and chronic prednisone continue on the current medication nebulizers no evidence of exacerbation. Hypertension continue blood pressure medication doing well Chronic kidney disease stable creatinine initially was 1.3 at the time of discharge 1.2 stable Diabetes stable continue current medications History of chronic anxiety continue on medications as per  home GERD: continue PPI, Maalox p.r.n.   Consults: None  Significant Diagnostic Studies: labs: Blood counts stable, normal white count, urinalysis negative blood chemistries negative troponin and cardiac markers negative creatinine one point and radiology: CT scan: chest x-ray of the lung CT angios showed no evidence of PE, lung nodules noted nno significant change from previous  Treatments: antacids  Discharge Exam: Blood pressure 117/73, pulse 96, temperature 97.9 F (36.6 C), temperature source Oral, resp. rate 18, height 5\' 6"  (1.676 m), weight 82 kg (180 lb 12.4 oz), SpO2 98.00%. General appearance: alert Resp: clear to auscultation bilaterally Cardio: regular rate and rhythm GI: soft, non-tender; bowel sounds normal; no masses,  no organomegaly  Disposition: 01-Home or Self Care  Discharge Orders    Future Appointments: Provider: Department: Dept Phone: Center:   01/30/2012 9:30 AM Mc-Pulmonary Rehab Maintenance [facility] CARDIAC REHAB [phone] None   02/01/2012 9:30 AM Mc-Pulmonary Rehab Maintenance [facility] CARDIAC REHAB [phone] None   02/06/2012 9:30 AM Mc-Pulmonary Rehab Maintenance [facility] CARDIAC REHAB [phone] None   02/08/2012 9:30 AM Mc-Pulmonary Rehab Maintenance [facility] CARDIAC REHAB [phone] None   02/13/2012 9:30 AM Mc-Pulmonary Rehab Maintenance [facility] CARDIAC REHAB [phone] None   02/20/2012 9:30 AM Mc-Pulmonary Rehab Maintenance [facility] CARDIAC REHAB [phone] None   02/22/2012 9:30 AM Mc-Pulmonary Rehab Maintenance [facility] CARDIAC REHAB [phone] None   02/26/2012 1:00 PM Lbct-Ct [address] [place] [facility] [address] [phone] LB-CT [address]   02/27/2012 9:30 AM Mc-Pulmonary Rehab Maintenance [facility] CARDIAC REHAB [phone] None   02/29/2012 9:30 AM Mc-Pulmonary  Rehab Maintenance [facility]  CARDIAC REHAB [phone] None   02/29/2012 3:20 PM Lurline Hare, MD [facility] RADI[facility] ONCOLOGY [phone] None   03/05/2012 9:30 AM Mc-Pulmonary Rehab Maintenance [facility] CARDIAC REHAB [phone] None   03/07/2012 9:30 AM Mc-Pulmonary Rehab Maintenance [facility] CARDIAC REHAB [phone] None   03/12/2012 9:30 AM Mc-Pulmonary Rehab Maintenance [facility] CARDIAC REHAB [phone] None   03/14/2012 9:30 AM Mc-Pulmonary Rehab Maintenance [facility] CARDIAC REHAB [phone] None   03/19/2012 9:30 AM Mc-Pulmonary Rehab Maintenance [facility] CARDIAC REHAB [phone] None   03/21/2012 9:30 AM Mc-Pulmonary Rehab Maintenance [facility] CARDIAC REHAB [phone] None   03/26/2012 9:30 AM Mc-Pulmonary Rehab Maintenance [facility] CARDIAC REHAB [phone] None   03/28/2012 9:30 AM Mc-Pulmonary Rehab Maintenance [facility] CARDIAC REHAB [phone] None   04/02/2012 9:30 AM Mc-Pulmonary Rehab Maintenance [facility] CARDIAC REHAB [phone] None   04/04/2012 9:30 AM Mc-Pulmonary Rehab Maintenance [facility] CARDIAC REHAB [phone] None   04/09/2012 9:30 AM Mc-Pulmonary Rehab Maintenance [facility] CARDIAC REHAB [phone] None   04/11/2012 9:30 AM Mc-Pulmonary Rehab Maintenance [facility] CARDIAC REHAB [phone] None   04/16/2012 9:30 AM Mc-Pulmonary Rehab Maintenance [facility] CARDIAC REHAB [phone] None   04/18/2012 9:30 AM Mc-Pulmonary Rehab Maintenance [facility] CARDIAC REHAB [phone] None   04/23/2012 9:30 AM Mc-Pulmonary Rehab Maintenance [facility] CARDIAC REHAB [phone] None   04/25/2012 9:30 AM Mc-Pulmonary Rehab Maintenance [facility] CARDIAC REHAB [phone] None   04/30/2012 9:30 AM Mc-Pulmonary Rehab Maintenance [facility] CARDIAC REHAB [phone] None   05/02/2012 9:30 AM Mc-Pulmonary Rehab Maintenance [facility] CARDIAC REHAB [phone] None   05/07/2012 9:30 AM Mc-Pulmonary Rehab Maintenance [facility] CARDIAC REHAB [phone] None   05/09/2012 9:30 AM Mc-Pulmonary Rehab Maintenance [facility] CARDIAC REHAB [phone] None   05/14/2012 9:30 AM Mc-Pulmonary Rehab Maintenance [facility] CARDIAC REHAB [phone] None   05/16/2012 9:30 AM Mc-Pulmonary Rehab Maintenance [facility] CARDIAC REHAB [phone] None   05/21/2012 9:30 AM Mc-Pulmonary Rehab Maintenance [facility] CARDIAC REHAB [phone] None   05/23/2012 9:30 AM Mc-Pulmonary Rehab Maintenance [facility] CARDIAC REHAB [phone] None   05/28/2012 9:30 AM Mc-Pulmonary Rehab Maintenance [facility] CARDIAC REHAB [phone] None   05/30/2012 9:30 AM Mc-Pulmonary Rehab Maintenance [facility] CARDIAC REHAB [phone] None   06/04/2012 9:30 AM Mc-Pulmonary Rehab Maintenance [facility] CARDIAC REHAB [phone] None   06/06/2012 9:30 AM Mc-Pulmonary Rehab Maintenance [facility] CARDIAC REHAB [phone] None   06/11/2012 9:30 AM Mc-Pulmonary Rehab Maintenance [facility] CARDIAC REHAB [phone] None   06/13/2012 9:30 AM Mc-Pulmonary Rehab Maintenance [facility] CARDIAC REHAB [phone] None   06/18/2012 9:30 AM Mc-Pulmonary Rehab Maintenance [facility] CARDIAC REHAB [phone] None   06/20/2012 9:30 AM Mc-Pulmonary Rehab Maintenance [facility] CARDIAC REHAB [phone] None   06/25/2012 9:30 AM Mc-Pulmonary Rehab Maintenance [facility] CARDIAC REHAB  [phone] None   06/27/2012 9:30 AM Mc-Pulmonary Rehab Maintenance [facility] CARDIAC REHAB [phone] None   07/02/2012 9:30 AM Mc-Pulmonary Rehab Maintenance [facility] CARDIAC REHAB [phone] None   07/04/2012 9:30 AM Mc-Pulmonary Rehab Maintenance [facility] CARDIAC REHAB [phone] None  07/09/2012 9:30 AM Mc-Pulmonary Rehab Maintenance [facility] CARDIAC REHAB [phone] None   07/11/2012 9:30 AM Mc-Pulmonary Rehab Maintenance [facility] CARDIAC REHAB [phone] None   07/16/2012 9:30 AM Mc-Pulmonary Rehab Maintenance [facility] CARDIAC REHAB [phone] None   07/18/2012 9:30 AM Mc-Pulmonary Rehab Maintenance [facility] CARDIAC REHAB [phone] None   07/23/2012 9:30 AM Mc-Pulmonary Rehab Maintenance [facility] CARDIAC REHAB [phone] None   07/25/2012 9:30 AM Mc-Pulmonary Rehab Maintenance [facility] CARDIAC REHAB [phone] None   07/30/2012 9:30 AM Mc-Pulmonary Rehab Maintenance [facility] CARDIAC REHAB [phone] None   08/01/2012 9:30 AM Mc-Pulmonary Rehab Maintenance [facility] CARDIAC REHAB [phone] None   08/06/2012 9:30 AM Mc-Pulmonary Rehab Maintenance [facility] CARDIAC REHAB [phone] None   08/08/2012 9:30 AM Mc-Pulmonary Rehab Maintenance [facility] CARDIAC REHAB [phone] None   08/13/2012 9:30 AM Mc-Pulmonary Rehab Maintenance [facility] CARDIAC REHAB [phone] None   08/15/2012 9:30 AM Mc-Pulmonary Rehab Maintenance [facility] CARDIAC REHAB [phone] None   08/20/2012 9:30 AM Mc-Pulmonary Rehab Maintenance [facility] CARDIAC REHAB [phone] None   08/22/2012 9:30 AM Mc-Pulmonary Rehab Maintenance [facility] CARDIAC REHAB [phone] None   08/27/2012  9:30 AM Mc-Pulmonary Rehab Maintenance [facility] CARDIAC REHAB [phone] None   08/29/2012 9:30 AM Mc-Pulmonary Rehab Maintenance [facility] CARDIAC REHAB [phone] None   09/03/2012 9:30 AM Mc-Pulmonary Rehab Maintenance [facility] CARDIAC REHAB [phone] None   09/05/2012 9:30 AM Mc-Pulmonary Rehab Maintenance [facility] CARDIAC REHAB [phone] None   09/10/2012 9:30 AM Mc-Pulmonary Rehab Maintenance [facility] CARDIAC REHAB [phone] None   09/12/2012 9:30 AM Mc-Pulmonary Rehab Maintenance [facility] CARDIAC REHAB [phone] None   09/17/2012 9:30 AM Mc-Pulmonary Rehab Maintenance [facility] CARDIAC REHAB [phone] None   09/19/2012 9:30 AM Mc-Pulmonary Rehab Maintenance [facility] CARDIAC REHAB [phone] None   09/24/2012 9:30 AM Mc-Pulmonary Rehab Maintenance [facility] CARDIAC REHAB [phone] None   09/26/2012 9:30 AM Mc-Pulmonary Rehab Maintenance [facility] CARDIAC REHAB [phone] None   10/01/2012 9:30 AM Mc-Pulmonary Rehab Maintenance [facility] CARDIAC REHAB [phone] None   10/03/2012 9:30 AM Mc-Pulmonary Rehab Maintenance [facility] CARDIAC REHAB [phone] None   10/08/2012 9:30 AM Mc-Pulmonary Rehab Maintenance [facility] CARDIAC REHAB [phone] None   10/10/2012 9:30 AM Mc-Pulmonary Rehab Maintenance [facility] CARDIAC REHAB [phone] None   10/15/2012 9:30 AM Mc-Pulmonary Rehab Maintenance [facility] CARDIAC REHAB [phone] None   10/17/2012 9:30 AM Mc-Pulmonary Rehab Maintenance [facility] CARDIAC REHAB [phone] None   10/22/2012 9:30 AM Mc-Pulmonary Rehab Maintenance [facility] CARDIAC REHAB [phone] None   10/24/2012 9:30 AM Mc-Pulmonary Rehab  Maintenance [facility] CARDIAC REHAB [phone] None   10/29/2012 9:30 AM Mc-Pulmonary Rehab Maintenance [facility] CARDIAC REHAB [phone] None   10/31/2012 9:30 AM Mc-Pulmonary Rehab Maintenance [facility] CARDIAC REHAB [phone] None   11/05/2012 9:30 AM Mc-Pulmonary Rehab Maintenance [facility] CARDIAC REHAB [phone] None   11/07/2012 9:30 AM Mc-Pulmonary Rehab Maintenance [facility] CARDIAC REHAB [phone] None   11/12/2012 9:30 AM Mc-Pulmonary Rehab Maintenance [facility] CARDIAC REHAB [phone] None   11/14/2012 9:30 AM Mc-Pulmonary Rehab Maintenance  [facility] CARDIAC REHAB [phone] None   11/19/2012 9:30 AM Mc-Pulmonary Rehab Maintenance [facility] CARDIAC REHAB [phone] None   11/21/2012 9:30 AM Mc-Pulmonary Rehab Maintenance [facility] CARDIAC REHAB [phone] None   11/26/2012 9:30 AM Mc-Pulmonary Rehab Maintenance [facility] CARDIAC REHAB [phone] None   11/28/2012 9:30 AM Mc-Pulmonary Rehab Maintenance [facility] CARDIAC REHAB [phone] None   12/03/2012 9:30 AM Mc-Pulmonary Rehab Maintenance [facility] CARDIAC REHAB [phone] None   12/05/2012 9:30 AM Mc-Pulmonary Rehab Maintenance [facility] CARDIAC REHAB [phone] None   12/10/2012 9:30 AM Mc-Pulmonary Rehab Maintenance [facility] CARDIAC REHAB [phone] None   12/12/2012 9:30 AM Mc-Pulmonary Rehab Maintenance [facility] CARDIAC REHAB [phone] None   12/17/2012 9:30 AM Mc-Pulmonary Rehab Maintenance [facility] CARDIAC REHAB [phone] None   12/19/2012 9:30 AM Mc-Pulmonary Rehab Maintenance [facility] CARDIAC REHAB [phone] None   12/24/2012 9:30 AM Mc-Pulmonary Rehab Maintenance [facility] CARDIAC REHAB [phone] None   12/26/2012 9:30 AM Mc-Pulmonary Rehab Maintenance [facility] CARDIAC REHAB [phone] None     Future Orders Please Complete By Expires   Diet - low sodium heart healthy      Increase activity slowly          Medication List     As of 01/29/2012  7:53 AM    TAKE these medications         albuterol 108 (90 BASE) MCG/ACT inhaler   Commonly known as: PROVENTIL HFA;VENTOLIN HFA   Inhale 2 puffs into the lungs every 4 (four) hours as needed. For shortness of breath      alum & mag hydroxide-simeth 200-200-20 MG/5ML suspension   Commonly known as: MAALOX/MYLANTA   Take 30 mLs by mouth every 6 (six) hours as needed (dyspepsia).      bimatoprost 0.01 % Soln   Commonly known as: LUMIGAN   Place 1 drop into both eyes at bedtime.      CALCIUM 500 +D 500-400 MG-UNIT Tabs   Generic drug: Calcium Carb-Cholecalciferol   Take 1 tablet by mouth 2 times daily at 12 noon and 4 pm.      clonazePAM 1 MG tablet   Commonly known as: KLONOPIN   Take 1 mg by mouth 2 (two) times daily as needed. For anxiety      diltiazem 180 MG 24 hr capsule   Commonly known as: CARDIZEM CD   Take 180 mg by mouth daily.      dorzolamide 2 % ophthalmic solution   Commonly known as: TRUSOPT   Place 1 drop into the right eye Three times a day.      ergocalciferol 50000 UNITS capsule   Commonly known as: VITAMIN D2   Take 50,000 Units by mouth once a week. Take on Monday      esomeprazole 40 MG capsule   Commonly known as: NEXIUM   Take 40 mg by mouth daily before breakfast.      furosemide 20 MG tablet   Commonly known as: LASIX   Take 20 mg by mouth daily as needed. For fluid retention      HYDROcodone-acetaminophen 5-500 MG per tablet   Commonly known as: VICODIN   Take 1-2 tablets by mouth every 6 (six) hours as needed. For pain      hydrOXYzine 25 MG tablet   Commonly known as: ATARAX/VISTARIL   Take  25 mg by mouth every 8 (eight)  hours as needed. For itching      ipratropium-albuterol 0.5-2.5 (3) MG/3ML Soln   Commonly known as: DUONEB   Take 3 mLs by nebulization 4 (four) times daily as needed. For shortness of breath      albuterol-ipratropium 18-103 MCG/ACT inhaler   Commonly known as: COMBIVENT   Inhale 2 puffs into the lungs every 6 (six) hours as needed. For shortness of breath/wheezing      lovastatin 20 MG tablet   Commonly known as: MEVACOR   Take 20 mg by mouth at bedtime.      metFORMIN 500 MG tablet   Commonly known as: GLUCOPHAGE   Take 500 mg by mouth daily with breakfast.      prednisoLONE acetate 1 % ophthalmic suspension   Commonly known as: PRED FORTE   Apply 1 drop to eye daily as needed. For acute iritis      predniSONE 10 MG tablet   Commonly known as: DELTASONE   Take 10 mg by mouth daily.      promethazine 25 MG tablet   Commonly known as: PHENERGAN   Take 25 mg by mouth every 6 (six) hours as needed. For nausea      tiotropium 18 MCG inhalation capsule   Commonly known as: SPIRIVA   Place 18 mcg into inhaler and inhale daily with breakfast.           Follow-up Information    Follow up with Georgann Housekeeper, MD.   Contact information:   [address] [place] [address] [phone]        discharge plan: total planning time 35 minutes;including instructions discharge summaries and medication updates  Signed: Mohammed Mcandrew 01/29/2012, 7:53 AM

## 2012-01-29 NOTE — Progress Notes (Signed)
Pt ready to be d/c'd. Went over d/c instructions, new medications, and went over MRSA handout. PIV removed, WNL. Pt switched O2 source to personal device from home; pt is O2 dependent. Pt d/c'd to home with husband.

## 2012-01-30 ENCOUNTER — Encounter (HOSPITAL_COMMUNITY): Payer: Self-pay

## 2012-02-01 ENCOUNTER — Encounter (HOSPITAL_COMMUNITY): Admission: RE | Admit: 2012-02-01 | Payer: Self-pay | Source: Ambulatory Visit

## 2012-02-06 ENCOUNTER — Encounter (HOSPITAL_COMMUNITY)
Admission: RE | Admit: 2012-02-06 | Discharge: 2012-02-06 | Disposition: A | Discharge status: Home / Self Care | Payer: Self-pay | Source: Ambulatory Visit | Attending: Internal Medicine | Admitting: Internal Medicine

## 2012-02-08 ENCOUNTER — Encounter (HOSPITAL_COMMUNITY)
Admission: RE | Admit: 2012-02-08 | Discharge: 2012-02-08 | Disposition: A | Discharge status: Home / Self Care | Payer: Self-pay | Source: Ambulatory Visit | Attending: Internal Medicine | Admitting: Internal Medicine

## 2012-02-13 ENCOUNTER — Encounter (HOSPITAL_COMMUNITY): Payer: Self-pay

## 2012-02-15 ENCOUNTER — Encounter (HOSPITAL_COMMUNITY): Payer: Self-pay

## 2012-02-19 ENCOUNTER — Other Ambulatory Visit (INDEPENDENT_AMBULATORY_CARE_PROVIDER_SITE_OTHER): Payer: Medicare Other

## 2012-02-19 DIAGNOSIS — E119 Type 2 diabetes mellitus without complications: Secondary | ICD-10-CM

## 2012-02-19 LAB — BASIC METABOLIC PANEL
BUN: 20 mg/dL (ref 6–23)
CO2: 26 mEq/L (ref 19–32)
Chloride: 101 mEq/L (ref 96–112)
Potassium: 4.2 mEq/L (ref 3.5–5.1)

## 2012-02-20 ENCOUNTER — Encounter (HOSPITAL_COMMUNITY)
Admission: RE | Admit: 2012-02-20 | Discharge: 2012-02-20 | Disposition: A | Discharge status: Home / Self Care | Payer: Self-pay | Source: Ambulatory Visit | Attending: Internal Medicine | Admitting: Internal Medicine

## 2012-02-20 DIAGNOSIS — K219 Gastro-esophageal reflux disease without esophagitis: Secondary | ICD-10-CM | POA: Insufficient documentation

## 2012-02-20 DIAGNOSIS — Z5189 Encounter for other specified aftercare: Secondary | ICD-10-CM | POA: Insufficient documentation

## 2012-02-20 DIAGNOSIS — J449 Chronic obstructive pulmonary disease, unspecified: Secondary | ICD-10-CM | POA: Insufficient documentation

## 2012-02-20 DIAGNOSIS — R0902 Hypoxemia: Secondary | ICD-10-CM | POA: Insufficient documentation

## 2012-02-20 DIAGNOSIS — J4489 Other specified chronic obstructive pulmonary disease: Secondary | ICD-10-CM | POA: Insufficient documentation

## 2012-02-22 ENCOUNTER — Encounter (HOSPITAL_COMMUNITY): Payer: Self-pay

## 2012-02-26 ENCOUNTER — Ambulatory Visit (INDEPENDENT_AMBULATORY_CARE_PROVIDER_SITE_OTHER)
Admission: RE | Admit: 2012-02-26 | Discharge: 2012-02-26 | Disposition: A | Discharge status: Home / Self Care | Payer: Medicare Other | Source: Ambulatory Visit | Attending: Internal Medicine | Admitting: Internal Medicine

## 2012-02-26 DIAGNOSIS — R911 Solitary pulmonary nodule: Secondary | ICD-10-CM

## 2012-02-26 MED ORDER — IOHEXOL 300 MG/ML  SOLN
80.0000 mL | Freq: Once | INTRAMUSCULAR | Status: AC | PRN
  Administered 2012-02-26: 80 mL via INTRAVENOUS

## 2012-02-27 ENCOUNTER — Encounter (HOSPITAL_COMMUNITY): Payer: Self-pay

## 2012-02-29 ENCOUNTER — Encounter: Payer: Self-pay | Admitting: Radiation Oncology

## 2012-02-29 ENCOUNTER — Ambulatory Visit
Admission: RE | Admit: 2012-02-29 | Discharge: 2012-02-29 | Disposition: A | Discharge status: Home / Self Care | Payer: Medicare Other | Source: Ambulatory Visit | Attending: Radiation Oncology | Admitting: Radiation Oncology

## 2012-02-29 ENCOUNTER — Encounter (HOSPITAL_COMMUNITY)
Admission: RE | Admit: 2012-02-29 | Discharge: 2012-02-29 | Disposition: A | Discharge status: Home / Self Care | Payer: Self-pay | Source: Ambulatory Visit | Attending: Internal Medicine | Admitting: Internal Medicine

## 2012-02-29 ENCOUNTER — Telehealth: Payer: Self-pay | Admitting: *Deleted

## 2012-02-29 VITALS — BP 125/73 | HR 101 | Temp 97.7°F | Resp 20 | Wt 182.3 lb

## 2012-02-29 DIAGNOSIS — C3490 Malignant neoplasm of unspecified part of unspecified bronchus or lung: Secondary | ICD-10-CM

## 2012-02-29 NOTE — Progress Notes (Signed)
Patient presents to the clinic today accompanied by a family member for a follow up appointment with Dr. Michell Heinrich. Patient alert and oriented to person, place, and time. No distress noted. Slow steady gait noted with assistance of rolling walker. Pleasant affect noted. Patient denies pain at this time. Continuous oxygen therapy 2 liters via nasal cannula noted. Patient reports shortness of breath continues with mild exertion. Patient denies improvement in dyspnea since treatment.  Patient reports severe acid reflux sent her to the hospital in November. Patient now taking something OTC maalox for reflux. Patient reports a cough that sometimes dry and other times productive with yellow sputum. Patient denies hemoptysis. Patient scheduled to see [facility] tomorrow. Reported all findings to Dr. Michell Heinrich.

## 2012-02-29 NOTE — Telephone Encounter (Signed)
CALLED PATIENT TO INFORM OF TEST, LVM FOR A RETURN CALL 

## 2012-02-29 NOTE — Progress Notes (Signed)
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  He was unfortunately hospitalized after treatment due to some chest pain. Luckily this was found to be GI related. While in house she had a CT scan and then she had another CT scan this week. Her oxygen level is stable. Her breathing symptoms are stable. She was continuing in pulmonary rehabilitation. She had some coughing spells upon returning home. Her husband is removed there are pressures and feels this has helped. The treated nodule in the right upper lobe is stable. The rest of her nodules are stable to slightly decreased in size.  Allergies: No Known Allergies  Medications:  Current Outpatient Prescriptions  Medication Sig Dispense Refill  . albuterol (PROVENTIL HFA;VENTOLIN HFA) 108 (90 BASE) MCG/ACT inhaler Inhale 2 puffs into the lungs every 4 (four) hours as needed. For shortness of breath      . albuterol-ipratropium (COMBIVENT) 18-103 MCG/ACT inhaler Inhale 2 puffs into the lungs every 6 (six) hours as needed. For shortness of breath/wheezing      . alum & mag hydroxide-simeth (MAALOX/MYLANTA) 200-200-20 MG/5ML suspension Take 30 mLs by mouth every 6 (six) hours as needed (dyspepsia).  355 mL  3  . bimatoprost (LUMIGAN) 0.01 % SOLN Place 1 drop into both eyes at bedtime.       . Calcium Carb-Cholecalciferol (CALCIUM 500 +D) 500-400 MG-UNIT TABS Take 1 tablet by mouth 2 times daily at 12 noon and 4 pm.       . clonazePAM (KLONOPIN) 1 MG tablet Take 1 mg by mouth 2 (two) times daily as needed. For anxiety      . diltiazem (CARDIZEM CD) 180 MG 24 hr capsule Take 180 mg by mouth daily.       . dorzolamide (TRUSOPT) 2 %  ophthalmic solution Place 1 drop into the right eye Three times a day.      . ergocalciferol (VITAMIN D2) 50000 UNITS capsule Take 50,000 Units by mouth once a week. Take on Monday      . esomeprazole (NEXIUM) 40 MG capsule Take 40 mg by mouth daily before breakfast.       . furosemide (LASIX) 20 MG tablet Take 20 mg by mouth daily as needed. For fluid retention      . HYDROcodone-acetaminophen (VICODIN) 5-500 MG per tablet Take 1-2 tablets by mouth every 6 (six) hours as needed. For pain      . hydrOXYzine (ATARAX/VISTARIL) 25 MG tablet Take 25 mg by mouth every 8 (eight) hours as needed. For itching      . ipratropium-albuterol (DUONEB) 0.5-2.5 (3) MG/3ML SOLN Take 3 mLs by nebulization 4 (four) times daily as needed. For shortness of breath      . lovastatin (MEVACOR) 20 MG tablet Take 20 mg by mouth at bedtime.      . metFORMIN (GLUCOPHAGE) 500 MG tablet Take 500 mg by mouth daily with breakfast.       . prednisoLONE acetate (PRED FORTE) 1 % ophthalmic suspension Apply 1 drop to eye daily as needed. For acute iritis      . predniSONE (DELTASONE) 10 MG tablet Take 10 mg by  mouth daily.       . promethazine (PHENERGAN) 25 MG tablet Take 25 mg by mouth every 6 (six) hours as needed. For nausea      . tiotropium (SPIRIVA) 18 MCG inhalation capsule Place 18 mcg into inhaler and inhale daily with breakfast.         Physical Exam:   weight is 182 lb 4.8 oz (82.691 kg). Her oral temperature is 97.7 F (36.5 C). Her blood pressure is 125/73 and her pulse is 101. Her respiration is 20 and oxygen saturation is 100%.  She is a pleasant female in no respiratory distress sitting with a nasal cannula in place  IMPRESSION: Melanie Best is a 66 y.o. female status post SBRT to a right upper lobe lesion with stable disease  PLAN:  Rescan in 6 months. Followup with Dr. Marchelle Gearing. Call with questions.    Melanie Hare, MD

## 2012-03-01 ENCOUNTER — Encounter: Payer: Self-pay | Admitting: Internal Medicine

## 2012-03-01 ENCOUNTER — Ambulatory Visit (INDEPENDENT_AMBULATORY_CARE_PROVIDER_SITE_OTHER): Payer: Medicare Other | Admitting: Internal Medicine

## 2012-03-01 VITALS — BP 102/70 | HR 106 | Temp 97.8°F | Ht 66.0 in | Wt 182.2 lb

## 2012-03-01 DIAGNOSIS — J4489 Other specified chronic obstructive pulmonary disease: Secondary | ICD-10-CM

## 2012-03-01 DIAGNOSIS — R911 Solitary pulmonary nodule: Secondary | ICD-10-CM

## 2012-03-01 DIAGNOSIS — J441 Chronic obstructive pulmonary disease with (acute) exacerbation: Secondary | ICD-10-CM

## 2012-03-01 DIAGNOSIS — J449 Chronic obstructive pulmonary disease, unspecified: Secondary | ICD-10-CM

## 2012-03-01 MED ORDER — LEVOFLOXACIN 500 MG PO TABS: 500.0000 mg | ORAL_TABLET | Freq: Every day | ORAL | Status: DC

## 2012-03-01 NOTE — Progress Notes (Signed)
Subjective:    Patient ID: Melanie Best, female    DOB: Oct 21, 1945, 66 y.o.   MRN: [id number]  HPI #COPD  - O2 dependent since 2004 (CT 2010 - emphysema, CXR - per hx clear in 2013)  - CAT score 29 - May 2013 (dyspnea, fatigue greatest symptom, not much cough. - Doe 100 feet and uses wheel chair for exertion beyond that) - CAT score 18 - Jne 2013  - Gold stage 3-4, fev1 0.8L/34%, Ratio 40, TLC 110%, DLCO 35% In May 2013  - Normal ECHO - May 2006 - Rx Mdds include advair, spiriva, daily prednisone 10mg  and duoneb  - REhab since oct 2012  - Fall 2013: Not a transplant candidate - [facility] transplant team screening  #Recurent AECOPD  - multiple in early 2013 - rx by PMD  - 03/01/2012 office visit - levaquin alone, no pred   #Ex-smoker  - 35 ppd. Quit 2007   #Baseline head tremor and walks with walkerr.   # Oct/Nov 2010  had s/p right nephrectomy for renal cell cancer discovered following MRI for leg/back pain.   #Also, s/p laminectomy following that in 2011 for "pinched nerve" by Dr Annell Greening.  # Presumed stage 1A NSCLC  -   In July 2013, CT showed 2.5cm spiculared RUL nodule. d PET scan 10/18/11 where Spiculated hypermetabolic right upper lobe lung nodule. This  measures 2.5 x 1.5 cm and a S.U.V. max of 6.1 on image 77.  - ENB 11/29/11: NON Diagnostic - normal resp cells - s.p empriic curative XRT ending Nov 2013  #left lung nodules  - July 2013: LUL 1.5cm and LL 6-9mm    OV 03/01/2012 Lung cancer - completed XRT nov 2013. Feels no side effect of xrt. CT 02/26/12  RUL mass is improved  Left lung nodules - stable since July 2013  COPD: Had Mild aecopd symptooms in past one week. Increased cough, sputum volume and change ins putum color to yellow. Improving spontaneously. Did noit call in as advised. No associated worsening dyspnea, wheeze.  CAT score is 28 today and worse than 18 baseline    CAT COPD Symptom & Quality of Life Score (GSK trademark) 0 is no burden. 5 is  highest burden 03/01/2012   Never Cough -> Cough all the time 3  No phlegm in chest -> Chest is full of phlegm 4  No chest tightness -> Chest feels very tight 0  No dyspnea for 1 flight stairs/hill -> Very dyspneic for 1 flight of stairs 5  No limitations for ADL at home -> Very limited with ADL at home 5  Confident leaving home -> Not at all confident leaving home 4  Sleep soundly -> Do not sleep soundly because of lung condition 4  Lots of Energy -> No energy at all 3  TOTAL Score (max 40)  28      Review of Systems  Constitutional: Negative for fever and unexpected weight change.  HENT: Negative for ear pain, nosebleeds, congestion, sore throat, rhinorrhea, sneezing, trouble swallowing, dental problem, postnasal drip and sinus pressure.   Eyes: Negative for redness and itching.  Respiratory: Positive for cough. Negative for chest tightness, shortness of breath and wheezing.   Cardiovascular: Negative for palpitations and leg swelling.  Gastrointestinal: Negative for nausea and vomiting.  Genitourinary: Negative for dysuria.  Musculoskeletal: Negative for joint swelling.  Skin: Negative for rash.  Neurological: Negative for headaches.  Hematological: Does not bruise/bleed easily.  Psychiatric/Behavioral: Negative for dysphoric mood. The  patient is not nervous/anxious.        Objective:   Physical Exam  Vitals reviewed. Constitutional: She is oriented to person, place, and time. She appears well-developed and well-nourished. No distress.       Has general tremor and head shake Looks baseline Pleasant Here with husband   Body mass index is 29.41 kg/(m^2).    HENT:  Head: Normocephalic and atraumatic.  Right Ear: External ear normal.  Left Ear: External ear normal.  Mouth/Throat: Oropharynx is clear and moist. No oropharyngeal exudate.  Eyes: Conjunctivae normal and EOM are normal. Pupils are equal, round, and reactive to light. Right eye exhibits no discharge. Left  eye exhibits no discharge. No scleral icterus.  Neck: Normal range of motion. Neck supple. No JVD present. No tracheal deviation present. No thyromegaly present.  Cardiovascular: Normal rate, regular rhythm, normal heart sounds and intact distal pulses.  Exam reveals no gallop and no friction rub.   No murmur heard. Pulmonary/Chest: Effort normal and breath sounds normal. No respiratory distress. She has no wheezes. She has no rales. She exhibits no tenderness.       barrell chest  Abdominal: Soft. Bowel sounds are normal. She exhibits no distension and no mass. There is no tenderness. There is no rebound and no guarding.  Musculoskeletal: Normal range of motion. She exhibits no edema and no tenderness.  Lymphadenopathy:    She has no cervical adenopathy.  Neurological: She is alert and oriented to person, place, and time. She has normal reflexes. No cranial nerve deficit. She exhibits normal muscle tone. Coordination normal.       Tremor + Using walker +  Skin: Skin is warm and dry. No rash noted. She is not diaphoretic. No erythema. No pallor.  Psychiatric: She has a normal mood and affect. Her behavior is normal. Judgment and thought content normal.        Assessment & Plan:

## 2012-03-01 NOTE — Patient Instructions (Addendum)
#  COPD Exacerbation  - Mild exacerbation past week that is improving but you need antibiotics  -  take levaquin 500mg  once daily  X 6 days - if worse, call us or come sooner or go to ER  #COPD  - continue oxygen and medications  #Lung cancer  - Appears radiation working on the right lung mass  - left side nodules are stable  # Followup  - 3 months or sooner if needed

## 2012-03-05 ENCOUNTER — Encounter (HOSPITAL_COMMUNITY): Payer: Self-pay

## 2012-03-07 ENCOUNTER — Encounter (HOSPITAL_COMMUNITY)
Admission: RE | Admit: 2012-03-07 | Discharge: 2012-03-07 | Disposition: A | Discharge status: Home / Self Care | Payer: Self-pay | Source: Ambulatory Visit | Attending: Internal Medicine | Admitting: Internal Medicine

## 2012-03-12 ENCOUNTER — Encounter (HOSPITAL_COMMUNITY): Payer: Self-pay

## 2012-03-14 ENCOUNTER — Encounter (HOSPITAL_COMMUNITY): Payer: Self-pay

## 2012-03-16 DIAGNOSIS — J441 Chronic obstructive pulmonary disease with (acute) exacerbation: Secondary | ICD-10-CM | POA: Insufficient documentation

## 2012-03-16 DIAGNOSIS — R911 Solitary pulmonary nodule: Secondary | ICD-10-CM | POA: Insufficient documentation

## 2012-03-16 NOTE — Assessment & Plan Note (Signed)
#  COPD Exacerbation  - Mild exacerbation past week that is improving but you need antibiotics  -  take levaquin 500mg  once daily  X 6 days - if worse, call us or come sooner or go to ER

## 2012-03-16 NOTE — Assessment & Plan Note (Signed)
#  COPD  - continue oxygen and medications

## 2012-03-16 NOTE — Assessment & Plan Note (Signed)
#  Lung cancer  - Appears radiation working on the right lung mass  - left side nodules are stable  # Followup  - 3 months or sooner if needed

## 2012-03-19 ENCOUNTER — Encounter (HOSPITAL_COMMUNITY)
Admission: RE | Admit: 2012-03-19 | Discharge: 2012-03-19 | Disposition: A | Discharge status: Home / Self Care | Payer: Self-pay | Source: Ambulatory Visit | Attending: Internal Medicine | Admitting: Internal Medicine

## 2012-03-21 ENCOUNTER — Encounter (HOSPITAL_COMMUNITY)
Admission: RE | Admit: 2012-03-21 | Discharge: 2012-03-21 | Disposition: A | Discharge status: Home / Self Care | Payer: Self-pay | Source: Ambulatory Visit | Attending: Internal Medicine | Admitting: Internal Medicine

## 2012-03-21 DIAGNOSIS — J449 Chronic obstructive pulmonary disease, unspecified: Secondary | ICD-10-CM | POA: Insufficient documentation

## 2012-03-21 DIAGNOSIS — J4489 Other specified chronic obstructive pulmonary disease: Secondary | ICD-10-CM | POA: Insufficient documentation

## 2012-03-21 DIAGNOSIS — Z5189 Encounter for other specified aftercare: Secondary | ICD-10-CM | POA: Insufficient documentation

## 2012-03-21 DIAGNOSIS — R0902 Hypoxemia: Secondary | ICD-10-CM | POA: Insufficient documentation

## 2012-03-21 DIAGNOSIS — K219 Gastro-esophageal reflux disease without esophagitis: Secondary | ICD-10-CM | POA: Insufficient documentation

## 2012-03-26 ENCOUNTER — Encounter (HOSPITAL_COMMUNITY): Payer: Self-pay

## 2012-03-28 ENCOUNTER — Encounter (HOSPITAL_COMMUNITY)
Admission: RE | Admit: 2012-03-28 | Discharge: 2012-03-28 | Disposition: A | Discharge status: Home / Self Care | Payer: Self-pay | Source: Ambulatory Visit | Attending: Internal Medicine | Admitting: Internal Medicine

## 2012-04-02 ENCOUNTER — Encounter (HOSPITAL_COMMUNITY)
Admission: RE | Admit: 2012-04-02 | Discharge: 2012-04-02 | Disposition: A | Discharge status: Home / Self Care | Payer: Self-pay | Source: Ambulatory Visit | Attending: Internal Medicine | Admitting: Internal Medicine

## 2012-04-04 ENCOUNTER — Encounter (HOSPITAL_COMMUNITY)
Admission: RE | Admit: 2012-04-04 | Discharge: 2012-04-04 | Disposition: A | Discharge status: Home / Self Care | Payer: Self-pay | Source: Ambulatory Visit | Attending: Internal Medicine | Admitting: Internal Medicine

## 2012-04-09 ENCOUNTER — Encounter (HOSPITAL_COMMUNITY)
Admission: RE | Admit: 2012-04-09 | Discharge: 2012-04-09 | Disposition: A | Discharge status: Home / Self Care | Payer: Self-pay | Source: Ambulatory Visit | Attending: Internal Medicine | Admitting: Internal Medicine

## 2012-04-11 ENCOUNTER — Encounter (HOSPITAL_COMMUNITY): Payer: Self-pay

## 2012-04-16 ENCOUNTER — Encounter (HOSPITAL_COMMUNITY)
Admission: RE | Admit: 2012-04-16 | Discharge: 2012-04-16 | Disposition: A | Discharge status: Home / Self Care | Payer: Self-pay | Source: Ambulatory Visit | Attending: Internal Medicine | Admitting: Internal Medicine

## 2012-04-18 ENCOUNTER — Encounter (HOSPITAL_COMMUNITY): Payer: Self-pay

## 2012-04-23 ENCOUNTER — Encounter (HOSPITAL_COMMUNITY): Payer: Self-pay

## 2012-04-23 DIAGNOSIS — K219 Gastro-esophageal reflux disease without esophagitis: Secondary | ICD-10-CM | POA: Insufficient documentation

## 2012-04-23 DIAGNOSIS — J4489 Other specified chronic obstructive pulmonary disease: Secondary | ICD-10-CM | POA: Insufficient documentation

## 2012-04-23 DIAGNOSIS — Z5189 Encounter for other specified aftercare: Secondary | ICD-10-CM | POA: Insufficient documentation

## 2012-04-23 DIAGNOSIS — R0902 Hypoxemia: Secondary | ICD-10-CM | POA: Insufficient documentation

## 2012-04-23 DIAGNOSIS — J449 Chronic obstructive pulmonary disease, unspecified: Secondary | ICD-10-CM | POA: Insufficient documentation

## 2012-04-25 ENCOUNTER — Encounter (HOSPITAL_COMMUNITY)
Admission: RE | Admit: 2012-04-25 | Discharge: 2012-04-25 | Disposition: A | Discharge status: Home / Self Care | Payer: Self-pay | Source: Ambulatory Visit | Attending: Internal Medicine | Admitting: Internal Medicine

## 2012-04-30 ENCOUNTER — Encounter (HOSPITAL_COMMUNITY)
Admission: RE | Admit: 2012-04-30 | Discharge: 2012-04-30 | Disposition: A | Discharge status: Home / Self Care | Payer: Self-pay | Source: Ambulatory Visit | Attending: Internal Medicine | Admitting: Internal Medicine

## 2012-05-02 ENCOUNTER — Encounter (HOSPITAL_COMMUNITY): Payer: Self-pay

## 2012-05-07 ENCOUNTER — Encounter (HOSPITAL_COMMUNITY)
Admission: RE | Admit: 2012-05-07 | Discharge: 2012-05-07 | Disposition: A | Discharge status: Home / Self Care | Payer: Self-pay | Source: Ambulatory Visit | Attending: Internal Medicine | Admitting: Internal Medicine

## 2012-05-09 ENCOUNTER — Encounter (HOSPITAL_COMMUNITY)
Admission: RE | Admit: 2012-05-09 | Discharge: 2012-05-09 | Disposition: A | Discharge status: Home / Self Care | Payer: Self-pay | Source: Ambulatory Visit | Attending: Internal Medicine | Admitting: Internal Medicine

## 2012-05-14 ENCOUNTER — Encounter (HOSPITAL_COMMUNITY)
Admission: RE | Admit: 2012-05-14 | Discharge: 2012-05-14 | Disposition: A | Discharge status: Home / Self Care | Payer: Self-pay | Source: Ambulatory Visit | Attending: Internal Medicine | Admitting: Internal Medicine

## 2012-05-16 ENCOUNTER — Encounter (HOSPITAL_COMMUNITY)
Admission: RE | Admit: 2012-05-16 | Discharge: 2012-05-16 | Disposition: A | Discharge status: Home / Self Care | Payer: Self-pay | Source: Ambulatory Visit | Attending: Internal Medicine | Admitting: Internal Medicine

## 2012-05-21 ENCOUNTER — Encounter (HOSPITAL_COMMUNITY): Payer: Self-pay

## 2012-05-21 DIAGNOSIS — J4489 Other specified chronic obstructive pulmonary disease: Secondary | ICD-10-CM | POA: Insufficient documentation

## 2012-05-21 DIAGNOSIS — J449 Chronic obstructive pulmonary disease, unspecified: Secondary | ICD-10-CM | POA: Insufficient documentation

## 2012-05-21 DIAGNOSIS — R0902 Hypoxemia: Secondary | ICD-10-CM | POA: Insufficient documentation

## 2012-05-21 DIAGNOSIS — Z5189 Encounter for other specified aftercare: Secondary | ICD-10-CM | POA: Insufficient documentation

## 2012-05-21 DIAGNOSIS — K219 Gastro-esophageal reflux disease without esophagitis: Secondary | ICD-10-CM | POA: Insufficient documentation

## 2012-05-23 ENCOUNTER — Encounter (HOSPITAL_COMMUNITY)
Admission: RE | Admit: 2012-05-23 | Discharge: 2012-05-23 | Disposition: A | Discharge status: Home / Self Care | Payer: Self-pay | Source: Ambulatory Visit | Attending: Internal Medicine | Admitting: Internal Medicine

## 2012-05-28 ENCOUNTER — Encounter (HOSPITAL_COMMUNITY)
Admission: RE | Admit: 2012-05-28 | Discharge: 2012-05-28 | Disposition: A | Discharge status: Home / Self Care | Payer: Self-pay | Source: Ambulatory Visit | Attending: Internal Medicine | Admitting: Internal Medicine

## 2012-05-30 ENCOUNTER — Encounter (HOSPITAL_COMMUNITY): Payer: Self-pay

## 2012-05-30 ENCOUNTER — Ambulatory Visit (INDEPENDENT_AMBULATORY_CARE_PROVIDER_SITE_OTHER)
Admission: RE | Admit: 2012-05-30 | Discharge: 2012-05-30 | Disposition: A | Discharge status: Home / Self Care | Payer: Medicare Other | Source: Ambulatory Visit | Attending: Internal Medicine | Admitting: Internal Medicine

## 2012-05-30 DIAGNOSIS — R911 Solitary pulmonary nodule: Secondary | ICD-10-CM

## 2012-06-02 ENCOUNTER — Telehealth: Payer: Self-pay | Admitting: Internal Medicine

## 2012-06-02 NOTE — Telephone Encounter (Signed)
Last seen dec 2013 and supposed to see in 3 months for copd fu but no appt. Please give first available.  She has recent ct chest march 2014: lung cancer -s;p radiatgion no grwoth. Left side nodule -slight growth but currently needs to be watched

## 2012-06-04 ENCOUNTER — Encounter (HOSPITAL_COMMUNITY): Payer: Self-pay

## 2012-06-04 NOTE — Telephone Encounter (Signed)
Pt has been scheduled to see MR on 06/27/12 @ 4:15pm.

## 2012-06-06 ENCOUNTER — Encounter (HOSPITAL_COMMUNITY): Payer: Self-pay

## 2012-06-11 ENCOUNTER — Encounter (HOSPITAL_COMMUNITY): Payer: Self-pay

## 2012-06-13 ENCOUNTER — Encounter (HOSPITAL_COMMUNITY)
Admission: RE | Admit: 2012-06-13 | Discharge: 2012-06-13 | Disposition: A | Discharge status: Home / Self Care | Payer: Self-pay | Source: Ambulatory Visit | Attending: Internal Medicine | Admitting: Internal Medicine

## 2012-06-18 ENCOUNTER — Encounter (HOSPITAL_COMMUNITY): Payer: Medicare Other | Attending: Internal Medicine

## 2012-06-18 DIAGNOSIS — J4489 Other specified chronic obstructive pulmonary disease: Secondary | ICD-10-CM | POA: Insufficient documentation

## 2012-06-18 DIAGNOSIS — K219 Gastro-esophageal reflux disease without esophagitis: Secondary | ICD-10-CM | POA: Insufficient documentation

## 2012-06-18 DIAGNOSIS — R0902 Hypoxemia: Secondary | ICD-10-CM | POA: Insufficient documentation

## 2012-06-18 DIAGNOSIS — Z5189 Encounter for other specified aftercare: Secondary | ICD-10-CM | POA: Insufficient documentation

## 2012-06-18 DIAGNOSIS — J449 Chronic obstructive pulmonary disease, unspecified: Secondary | ICD-10-CM | POA: Insufficient documentation

## 2012-06-20 ENCOUNTER — Encounter (HOSPITAL_COMMUNITY): Payer: Medicare Other

## 2012-06-25 ENCOUNTER — Encounter (HOSPITAL_COMMUNITY): Payer: Medicare Other

## 2012-06-27 ENCOUNTER — Encounter: Payer: Self-pay | Admitting: Internal Medicine

## 2012-06-27 ENCOUNTER — Ambulatory Visit (INDEPENDENT_AMBULATORY_CARE_PROVIDER_SITE_OTHER): Payer: Medicare Other | Admitting: Internal Medicine

## 2012-06-27 ENCOUNTER — Encounter (HOSPITAL_COMMUNITY): Payer: Medicare Other

## 2012-06-27 VITALS — BP 114/70 | HR 120 | Temp 98.0°F | Ht 66.0 in | Wt 182.8 lb

## 2012-06-27 DIAGNOSIS — J4489 Other specified chronic obstructive pulmonary disease: Secondary | ICD-10-CM

## 2012-06-27 DIAGNOSIS — J449 Chronic obstructive pulmonary disease, unspecified: Secondary | ICD-10-CM

## 2012-06-27 DIAGNOSIS — R911 Solitary pulmonary nodule: Secondary | ICD-10-CM

## 2012-06-27 NOTE — Patient Instructions (Addendum)
#  COPD - Stable disease  - continue oxygen and medications - Start N. acetylcysteine 600 mg twice a day  - [place] study published in Lancet [employer] Journal in 2014 shows it helps   - this is not to make you feel better but to to prevent recurrent bronchitis episodes  - You are more likely to get this drug at [facility] or a vitamin store then at [employer] or [facility] or [facility]  - You can also get this at website ConstitutionJournal.co.uk  - It functions as an anti-oxidant and there are minimal/none side effects - Please ensure that you have a bone scan - Continue daily physical exercises through physical therapy    #Lung cancer  - Appears radiation working on the right lung mass  - left side nodule in the left upper lobe is stable but in the left lower lobe is either stable or slightly worse - Followup CT scan of the chest in June 2014 according to Dr. Lurline Hare  # Followup  - 3 months or sooner if needed

## 2012-06-27 NOTE — Progress Notes (Signed)
Subjective:    Patient ID: Melanie Best, female    DOB: September 07, 1945, 67 y.o.   MRN: [id number]  HPI #COPD  - O2 dependent since 2004 (CT 2010 - emphysema, CXR - per hx clear in 2013)  - CAT score 29 - May 2013 (dyspnea, fatigue greatest symptom, not much cough. - Doe 100 feet and uses wheel chair for exertion beyond that) - CAT score 18 - Jne 2013  - Gold stage 3-4, fev1 0.8L/34%, Ratio 40, TLC 110%, DLCO 35% In May 2013  - Normal ECHO - May 2006 - Rx Mdds include advair, spiriva, daily prednisone 10mg  and duoneb  - REhab since oct 2012  - Fall 2013: Not a transplant candidate - [facility] transplant team screening  #Recurent AECOPD  - multiple in early 2013 - rx by PMD  - 03/01/2012 office visit - levaquin alone, no pred - Start N. acetylcysteine April 2014   #Ex-smoker  - 35 ppd. Quit 2007   #Baseline head tremor and walks with walkerr.   # Oct/Nov 2010  had s/p right nephrectomy for renal cell cancer discovered following MRI for leg/back pain.   #Also, s/p laminectomy following that in 2011 for "pinched nerve" by Dr Annell Greening.  # Presumed stage 1A NSCLC  -   In July 2013, CT showed 2.5cm spiculared RUL nodule. d PET scan 10/18/11 where Spiculated hypermetabolic right upper lobe lung nodule. This  measures 2.5 x 1.5 cm and a S.U.V. max of 6.1 on image 77.  - ENB 11/29/11: NON Diagnostic - normal resp cells - s.p empriic curative XRT ending Nov 2013 - Dec 2013: RUL mass improved 1.6cm - March 2014 : RUL mass improved 1.4cm  #left lung nodules  - July 2013: LUL/LLL 1.5cm and LL 6-66mm - March 2014: :LUL/LLL 1.5cm and no change LLL 8mm and slightly worse     OV  06/27/2012  Followup for COPD, recurrent COPD and lung nodules  - Presents as always with her husband. She has gained weight due to her dietary indiscretion. Overall she reports stabilityy in health and a feeling of well being. COPD cat score is 25 that is improved from last visit but appears worse than her baseline a  few years ago. Perhaps this is a new baseline. See details below.  - In terms of lung cancer lung nodules. She had CT scan of the chest March 2014. This shows progressive improvement in the right upper lobe mass that was radiated. The left upper lobe nodule is stable without change. The left lower lobe nodule is stable or slightly worse; all documented above. She tells me that she is followed with Dr. Michell Heinrich in June 2014 and has a repeat CT scan at that time pending   CAT COPD Symptom & Quality of Life Score (GSK trademark) 0 is no burden. 5 is highest burden 03/01/2012  06/27/2012   Never Cough -> Cough all the time 3 2  No phlegm in chest -> Chest is full of phlegm 4 5  No chest tightness -> Chest feels very tight 0 0  No dyspnea for 1 flight stairs/hill -> Very dyspneic for 1 flight of stairs 5 5  No limitations for ADL at home -> Very limited with ADL at home 5 5  Confident leaving home -> Not at all confident leaving home 4 3  Sleep soundly -> Do not sleep soundly because of lung condition 4 3  Lots of Energy -> No energy at all 3 2  TOTAL  Score (max 40)  28 25    Past, Family, Social reviewed: no change since last visit   Review of Systems  Constitutional: Negative for fever and unexpected weight change.  HENT: Negative for ear pain, nosebleeds, congestion, sore throat, rhinorrhea, sneezing, trouble swallowing, dental problem, postnasal drip and sinus pressure.   Eyes: Negative for redness and itching.  Respiratory: Positive for shortness of breath. Negative for cough, chest tightness and wheezing.   Cardiovascular: Negative for palpitations and leg swelling.  Gastrointestinal: Negative for nausea and vomiting.  Genitourinary: Negative for dysuria.  Musculoskeletal: Negative for joint swelling.  Skin: Negative for rash.  Neurological: Negative for headaches.  Hematological: Does not bruise/bleed easily.  Psychiatric/Behavioral: Negative for dysphoric mood. The patient is  not nervous/anxious.        Objective:   Physical Exam Vitals reviewed. Constitutional: She is oriented to person, place, and time. She appears well-developed and well-nourished. No distress.       Has general tremor and head shake Looks baseline Pleasant Here with husband   Body mass index is 29.52 kg/(m^2). Marland Kitchen    HENT:  Head: Normocephalic and atraumatic.  Right Ear: External ear normal.  Left Ear: External ear normal.  Mouth/Throat: Oropharynx is clear and moist. No oropharyngeal exudate.  Eyes: Conjunctivae normal and EOM are normal. Pupils are equal, round, and reactive to light. Right eye exhibits no discharge. Left eye exhibits no discharge. No scleral icterus.  Neck: Normal range of motion. Neck supple. No JVD present. No tracheal deviation present. No thyromegaly present.  Cardiovascular: Normal rate, regular rhythm, normal heart sounds and intact distal pulses.  Exam reveals no gallop and no friction rub.   No murmur heard. Pulmonary/Chest: Effort normal and breath sounds normal. No respiratory distress. She has no wheezes. She has no rales. She exhibits no tenderness.       barrell chest  Abdominal: Soft. Bowel sounds are normal. She exhibits no distension and no mass. There is no tenderness. There is no rebound and no guarding.  Musculoskeletal: Normal range of motion. She exhibits no edema and no tenderness.  Lymphadenopathy:    She has no cervical adenopathy.  Neurological: She is alert and oriented to person, place, and time. She has normal reflexes. No cranial nerve deficit. She exhibits normal muscle tone. Coordination normal.       Tremor + Using walker +  Skin: Skin is warm and dry. No rash noted. She is not diaphoretic. No erythema. No pallor.  Psychiatric: She has a normal mood and affect. Her behavior is normal. Judgment and thought content normal.          Assessment & Plan:

## 2012-07-02 ENCOUNTER — Encounter (HOSPITAL_COMMUNITY): Payer: Medicare Other

## 2012-07-02 NOTE — Assessment & Plan Note (Signed)
  July 2013:  2.5 cm new RUL nodule compared to July 2012 PET HOT + No change on CT 11/15/11 PErsistent on CXR 11/29/11 Non-diagnostic ENG 11/29/11 Status post empiric radiation ending November 2013  - CT scan of the chest March 2014 shows improvement in right upper lobe mass 1.4 cm   Plan  - Continued serial CT scan Do June 2014. I think this is been ordered already by radiation specialist

## 2012-07-02 NOTE — Assessment & Plan Note (Addendum)
#  COPD - Stable disease  - continue oxygen and medications - Start N. acetylcysteine 600 mg twice a day  - [place] study published in Lancet British Journal in 2014 shows it helps   - this is not to make you feel better but to to prevent recurrent bronchitis episodes  - You are more likely to get this drug at [facility] or a vitamin store then at [employer] or [facility] or [facility]  - You can also get this at website [employer].co.uk  - It functions as an anti-oxidant and there are minimal/none side effects - Please ensure that you have a bone scan - Continue daily physical exercises through physical therapy

## 2012-07-02 NOTE — Assessment & Plan Note (Signed)
Left lung nodules  - July 2013: LUL/LLL 1.5cm and LL 6-25mm  Currently March 2014: :LUL/LLL 1.5cm and no change LLL 8mm and slightly worse  PLAN - Serial observation with the next CT scan of the chest due June 2014

## 2012-07-04 ENCOUNTER — Encounter (HOSPITAL_COMMUNITY): Payer: Medicare Other

## 2012-07-09 ENCOUNTER — Encounter (HOSPITAL_COMMUNITY): Payer: Medicare Other

## 2012-07-11 ENCOUNTER — Encounter (HOSPITAL_COMMUNITY): Payer: Medicare Other

## 2012-07-16 ENCOUNTER — Encounter (HOSPITAL_COMMUNITY): Payer: Medicare Other

## 2012-07-18 ENCOUNTER — Encounter (HOSPITAL_COMMUNITY): Payer: Medicare Other

## 2012-07-23 ENCOUNTER — Encounter (HOSPITAL_COMMUNITY): Payer: Medicare Other

## 2012-07-25 ENCOUNTER — Encounter (HOSPITAL_COMMUNITY): Payer: Medicare Other

## 2012-07-30 ENCOUNTER — Encounter (HOSPITAL_COMMUNITY): Payer: Medicare Other

## 2012-08-01 ENCOUNTER — Encounter (HOSPITAL_COMMUNITY): Payer: Medicare Other

## 2012-08-06 ENCOUNTER — Encounter (HOSPITAL_COMMUNITY): Payer: Medicare Other

## 2012-08-08 ENCOUNTER — Encounter (HOSPITAL_COMMUNITY): Payer: Medicare Other

## 2012-08-13 ENCOUNTER — Encounter (HOSPITAL_COMMUNITY): Payer: Medicare Other

## 2012-08-15 ENCOUNTER — Encounter (HOSPITAL_COMMUNITY): Payer: Medicare Other

## 2012-08-20 ENCOUNTER — Encounter (HOSPITAL_COMMUNITY): Payer: Medicare Other

## 2012-08-22 ENCOUNTER — Encounter (HOSPITAL_COMMUNITY): Payer: Medicare Other

## 2012-08-23 ENCOUNTER — Other Ambulatory Visit: Payer: Self-pay | Admitting: Radiation Oncology

## 2012-08-26 ENCOUNTER — Telehealth: Payer: Self-pay | Admitting: *Deleted

## 2012-08-26 NOTE — Telephone Encounter (Signed)
CALLED PATIENT TO ASK IF SHE COULD COME IN EARLY TOMORROW FOR LABS PRIOR TO TEST, LVM FOR A RETURN CALL

## 2012-08-27 ENCOUNTER — Encounter (HOSPITAL_COMMUNITY): Payer: Medicare Other

## 2012-08-27 ENCOUNTER — Ambulatory Visit (HOSPITAL_COMMUNITY)
Admission: RE | Admit: 2012-08-27 | Discharge: 2012-08-27 | Disposition: A | Discharge status: Home / Self Care | Payer: Medicare Other | Source: Ambulatory Visit | Attending: Radiation Oncology | Admitting: Radiation Oncology

## 2012-08-27 ENCOUNTER — Ambulatory Visit
Admission: RE | Admit: 2012-08-27 | Discharge: 2012-08-27 | Disposition: A | Discharge status: Home / Self Care | Payer: Medicare Other | Source: Ambulatory Visit | Attending: Radiation Oncology | Admitting: Radiation Oncology

## 2012-08-27 DIAGNOSIS — C3490 Malignant neoplasm of unspecified part of unspecified bronchus or lung: Secondary | ICD-10-CM | POA: Insufficient documentation

## 2012-08-27 DIAGNOSIS — M899 Disorder of bone, unspecified: Secondary | ICD-10-CM | POA: Insufficient documentation

## 2012-08-27 DIAGNOSIS — R918 Other nonspecific abnormal finding of lung field: Secondary | ICD-10-CM | POA: Insufficient documentation

## 2012-08-27 DIAGNOSIS — K7689 Other specified diseases of liver: Secondary | ICD-10-CM | POA: Insufficient documentation

## 2012-08-27 DIAGNOSIS — R0602 Shortness of breath: Secondary | ICD-10-CM | POA: Insufficient documentation

## 2012-08-27 DIAGNOSIS — M949 Disorder of cartilage, unspecified: Secondary | ICD-10-CM | POA: Insufficient documentation

## 2012-08-27 LAB — BUN AND CREATININE (CC13): BUN: 10.7 mg/dL (ref 7.0–26.0)

## 2012-08-27 MED ORDER — IOHEXOL 300 MG/ML  SOLN
80.0000 mL | Freq: Once | INTRAMUSCULAR | Status: AC | PRN
  Administered 2012-08-27: 80 mL via INTRAVENOUS

## 2012-08-28 ENCOUNTER — Encounter: Payer: Self-pay | Admitting: Radiation Oncology

## 2012-08-29 ENCOUNTER — Encounter (HOSPITAL_COMMUNITY): Payer: Medicare Other

## 2012-08-29 ENCOUNTER — Ambulatory Visit: Payer: Medicare Other | Admitting: Radiation Oncology

## 2012-09-03 ENCOUNTER — Encounter (HOSPITAL_COMMUNITY): Payer: Medicare Other

## 2012-09-05 ENCOUNTER — Encounter (HOSPITAL_COMMUNITY): Payer: Medicare Other

## 2012-09-06 ENCOUNTER — Encounter: Payer: Self-pay | Admitting: Radiation Oncology

## 2012-09-06 ENCOUNTER — Ambulatory Visit
Admission: RE | Admit: 2012-09-06 | Discharge: 2012-09-06 | Disposition: A | Discharge status: Home / Self Care | Payer: Medicare Other | Source: Ambulatory Visit | Attending: Radiation Oncology | Admitting: Radiation Oncology

## 2012-09-06 DIAGNOSIS — R911 Solitary pulmonary nodule: Secondary | ICD-10-CM

## 2012-09-06 HISTORY — DX: Personal history of irradiation: Z92.3

## 2012-09-06 NOTE — Progress Notes (Signed)
Department of Radiation Oncology  Phone: [phone]  Fax: [phone]   Name: Melanie Best DOB: 1946/02/22  MRN: [id number]   Date: 09/06/2012  Follow Up Visit Note  Diagnosis: T1N0 NSCLC (no biopsy) of the right upper lobe   Interval since last radiation: 8 months  Interval History: Melanie Best presents today for routine followup. She has some coughing which is normal for her. No fevers or chills. Her breathing is stable. Her oxygen levels are stable. She had a CT of the chest earlier this month which showed a decrease in size of her treated right upper lobe nodule as well as stability of the other nodules in her chest.    Allergies: No Known Allergies  Medications:  Current Outpatient Prescriptions   Medication  Sig  Dispense  Refill   .  albuterol (PROVENTIL HFA;VENTOLIN HFA) 108 (90 BASE) MCG/ACT inhaler  Inhale 2 puffs into the lungs every 4 (four) hours as needed. For shortness of breath     .  albuterol-ipratropium (COMBIVENT) 18-103 MCG/ACT inhaler  Inhale 2 puffs into the lungs every 6 (six) hours as needed. For shortness of breath/wheezing     .  alum & mag hydroxide-simeth (MAALOX/MYLANTA) 200-200-20 MG/5ML suspension  Take 30 mLs by mouth every 6 (six) hours as needed (dyspepsia).  355 mL  3   .  bimatoprost (LUMIGAN) 0.01 % SOLN  Place 1 drop into both eyes at bedtime.     .  Calcium Carb-Cholecalciferol (CALCIUM 500 +D) 500-400 MG-UNIT TABS  Take 1 tablet by mouth 2 times daily at 12 noon and 4 pm.     .  clonazePAM (KLONOPIN) 1 MG tablet  Take 1 mg by mouth 2 (two) times daily as needed. For anxiety     .  diltiazem (CARDIZEM CD) 180 MG 24 hr capsule  Take 180 mg by mouth daily.     .  dorzolamide (TRUSOPT) 2 % ophthalmic solution  Place 1 drop into the right eye Three times a day.     .  ergocalciferol (VITAMIN D2) 50000 UNITS capsule  Take 50,000 Units by mouth once a week. Take on Monday     .  esomeprazole (NEXIUM) 40 MG capsule  Take 40 mg by mouth daily before  breakfast.     .  furosemide (LASIX) 20 MG tablet  Take 20 mg by mouth daily as needed. For fluid retention     .  HYDROcodone-acetaminophen (VICODIN) 5-500 MG per tablet  Take 1-2 tablets by mouth every 6 (six) hours as needed. For pain     .  hydrOXYzine (ATARAX/VISTARIL) 25 MG tablet  Take 25 mg by mouth every 8 (eight) hours as needed. For itching     .  ipratropium-albuterol (DUONEB) 0.5-2.5 (3) MG/3ML SOLN  Take 3 mLs by nebulization 4 (four) times daily as needed. For shortness of breath     .  lovastatin (MEVACOR) 20 MG tablet  Take 20 mg by mouth at bedtime.     .  metFORMIN (GLUCOPHAGE) 500 MG tablet  Take 500 mg by mouth daily with breakfast.     .  prednisoLONE acetate (PRED FORTE) 1 % ophthalmic suspension  Apply 1 drop to eye daily as needed. For acute iritis     .  predniSONE (DELTASONE) 10 MG tablet  Take 10 mg by mouth daily.     .  promethazine (PHENERGAN) 25 MG tablet  Take 25 mg by mouth every 6 (six) hours as needed. For nausea     .  tiotropium (SPIRIVA) 18 MCG inhalation capsule  Place 18 mcg into inhaler and inhale daily with breakfast.     Physical Exam:  Wt Readings from Last 3 Encounters:  06/27/12 182 lb 12.8 oz (82.918 kg)  03/01/12 182 lb 3.2 oz (82.645 kg)  02/29/12 182 lb 4.8 oz (82.691 kg)   Temp Readings from Last 3 Encounters:  09/06/12 99 F (37.2 C) Oral  06/27/12 98 F (36.7 C) Oral  03/01/12 97.8 F (36.6 C) Oral   BP Readings from Last 3 Encounters:  09/06/12 116/71  06/27/12 114/70  03/01/12 102/70   Pulse Readings from Last 3 Encounters:  09/06/12 106  06/27/12 120  03/01/12 106   She is a pleasant female in no respiratory distress sitting with a nasal cannula in place  IMPRESSION: Melanie Best is a 67 y.o. female status post SBRT to a right upper lobe lesion with stable disease   PLAN: Rescan in 6 months. Followup with Dr. Marchelle Gearing.  I will see her back in a year with a scan at that time.  Melanie Hare, MD

## 2012-09-06 NOTE — Addendum Note (Signed)
Encounter addended by: Lurline Hare, MD on: 09/06/2012  3:32 PM<BR>     Documentation filed: Notes Section

## 2012-09-06 NOTE — Progress Notes (Signed)
Follow up  Right lung s/p rad txs: 01/09/12;01/11/12;01/15/12;01/17/12;&01/19/12  patient in w/c, and oxygen 2 liters n/c, sob with mild exertion, feet swelling, takes lasix prn, fatigued at times, coughing up yellow pheglm mopstly in mornings, appetite good, sometimes thick saliva in mouth when wakes up and mouth real dry 2:37 PM  Ct chest results 08/27/12,

## 2012-09-10 ENCOUNTER — Other Ambulatory Visit (HOSPITAL_COMMUNITY): Payer: Self-pay | Admitting: Diagnostic Radiology

## 2012-09-10 ENCOUNTER — Encounter (HOSPITAL_COMMUNITY): Payer: Medicare Other

## 2012-09-10 ENCOUNTER — Other Ambulatory Visit: Payer: Self-pay | Admitting: Internal Medicine

## 2012-09-10 DIAGNOSIS — M47812 Spondylosis without myelopathy or radiculopathy, cervical region: Secondary | ICD-10-CM

## 2012-09-10 DIAGNOSIS — M5410 Radiculopathy, site unspecified: Secondary | ICD-10-CM

## 2012-09-12 ENCOUNTER — Encounter (HOSPITAL_COMMUNITY): Payer: Medicare Other

## 2012-09-15 ENCOUNTER — Ambulatory Visit
Admission: RE | Admit: 2012-09-15 | Discharge: 2012-09-15 | Disposition: A | Discharge status: Home / Self Care | Payer: Medicare Other | Source: Ambulatory Visit | Attending: Internal Medicine | Admitting: Internal Medicine

## 2012-09-15 DIAGNOSIS — M5410 Radiculopathy, site unspecified: Secondary | ICD-10-CM

## 2012-09-15 DIAGNOSIS — M47812 Spondylosis without myelopathy or radiculopathy, cervical region: Secondary | ICD-10-CM

## 2012-09-17 ENCOUNTER — Encounter (HOSPITAL_COMMUNITY): Payer: Medicare Other

## 2012-09-19 ENCOUNTER — Encounter (HOSPITAL_COMMUNITY): Payer: Medicare Other

## 2012-09-24 ENCOUNTER — Encounter (HOSPITAL_COMMUNITY): Payer: Medicare Other

## 2012-09-26 ENCOUNTER — Encounter (HOSPITAL_COMMUNITY): Payer: Medicare Other

## 2012-10-01 ENCOUNTER — Encounter (HOSPITAL_COMMUNITY): Payer: Medicare Other

## 2012-10-03 ENCOUNTER — Encounter (HOSPITAL_COMMUNITY): Payer: Medicare Other

## 2012-10-08 ENCOUNTER — Encounter (HOSPITAL_COMMUNITY): Payer: Medicare Other

## 2012-10-10 ENCOUNTER — Encounter (HOSPITAL_COMMUNITY): Payer: Medicare Other

## 2012-10-15 ENCOUNTER — Encounter (HOSPITAL_COMMUNITY): Payer: Medicare Other

## 2012-10-17 ENCOUNTER — Encounter (HOSPITAL_COMMUNITY): Payer: Medicare Other

## 2012-10-22 ENCOUNTER — Encounter (HOSPITAL_COMMUNITY): Payer: Medicare Other

## 2012-10-23 ENCOUNTER — Other Ambulatory Visit: Payer: Self-pay

## 2012-10-24 ENCOUNTER — Encounter (HOSPITAL_COMMUNITY): Payer: Medicare Other

## 2012-10-29 ENCOUNTER — Encounter (HOSPITAL_COMMUNITY): Payer: Medicare Other

## 2012-10-31 ENCOUNTER — Encounter (HOSPITAL_COMMUNITY): Payer: Medicare Other

## 2012-11-05 ENCOUNTER — Encounter (HOSPITAL_COMMUNITY): Payer: Medicare Other

## 2012-11-07 ENCOUNTER — Encounter (HOSPITAL_COMMUNITY): Payer: Medicare Other

## 2012-11-12 ENCOUNTER — Encounter (HOSPITAL_COMMUNITY): Payer: Medicare Other

## 2012-11-14 ENCOUNTER — Encounter (HOSPITAL_COMMUNITY): Payer: Medicare Other

## 2012-11-19 ENCOUNTER — Encounter (HOSPITAL_COMMUNITY): Payer: Medicare Other

## 2012-11-21 ENCOUNTER — Encounter (HOSPITAL_COMMUNITY): Payer: Medicare Other

## 2012-11-25 ENCOUNTER — Other Ambulatory Visit: Payer: Self-pay

## 2012-11-25 DIAGNOSIS — Z1231 Encounter for screening mammogram for malignant neoplasm of breast: Secondary | ICD-10-CM

## 2012-11-26 ENCOUNTER — Encounter (HOSPITAL_COMMUNITY): Payer: Medicare Other

## 2012-11-28 ENCOUNTER — Encounter (HOSPITAL_COMMUNITY): Payer: Medicare Other

## 2012-11-29 ENCOUNTER — Telehealth (HOSPITAL_COMMUNITY): Payer: Self-pay | Admitting: Respiratory Therapy

## 2012-11-29 NOTE — Telephone Encounter (Signed)
Patient suppose to follow up with you and she did not, next scan Oct 2014.

## 2012-11-29 NOTE — Telephone Encounter (Signed)
Hi Melanie Best  She needs to see mwtih fu CT chest wo contrast in oct 2014 or Nov 2014  THanks  MR

## 2012-12-02 NOTE — Telephone Encounter (Signed)
Pt has her CT set for 01/06/13. You do not have any appts available and you are only here 2 days after she has her scan. When would you like her to f/u? Appt with TP? Are you going to add on any days? Carron Curie, CMA

## 2012-12-03 ENCOUNTER — Encounter (HOSPITAL_COMMUNITY): Payer: Medicare Other

## 2012-12-05 ENCOUNTER — Encounter (HOSPITAL_COMMUNITY): Payer: Medicare Other

## 2012-12-10 ENCOUNTER — Encounter (HOSPITAL_COMMUNITY): Payer: Medicare Other

## 2012-12-12 ENCOUNTER — Encounter (HOSPITAL_COMMUNITY): Payer: Medicare Other

## 2012-12-17 ENCOUNTER — Encounter (HOSPITAL_COMMUNITY): Payer: Medicare Other

## 2012-12-19 ENCOUNTER — Encounter (HOSPITAL_COMMUNITY): Payer: Medicare Other

## 2012-12-22 ENCOUNTER — Other Ambulatory Visit: Payer: Self-pay | Admitting: Radiation Oncology

## 2012-12-23 ENCOUNTER — Telehealth: Payer: Self-pay | Admitting: *Deleted

## 2012-12-23 NOTE — Telephone Encounter (Signed)
CALLED PATIENT TO ASK ABOUT WHICH DAY THAT SHE WOULD LIKE TO COME IN FOR Melanie Best ME Friday October 17, APPT. HAS BEEN SCHEDULED FOR 01-03-13 AT 3 PM

## 2012-12-24 ENCOUNTER — Encounter (HOSPITAL_COMMUNITY): Payer: Medicare Other

## 2012-12-25 ENCOUNTER — Ambulatory Visit
Admission: RE | Admit: 2012-12-25 | Discharge: 2012-12-25 | Disposition: A | Discharge status: Home / Self Care | Payer: Medicare Other | Source: Ambulatory Visit

## 2012-12-25 DIAGNOSIS — Z1231 Encounter for screening mammogram for malignant neoplasm of breast: Secondary | ICD-10-CM

## 2012-12-26 ENCOUNTER — Encounter (HOSPITAL_COMMUNITY): Payer: Medicare Other

## 2012-12-30 ENCOUNTER — Other Ambulatory Visit: Payer: Self-pay | Admitting: Internal Medicine

## 2012-12-30 DIAGNOSIS — R928 Other abnormal and inconclusive findings on diagnostic imaging of breast: Secondary | ICD-10-CM

## 2013-01-03 ENCOUNTER — Ambulatory Visit
Admission: RE | Admit: 2013-01-03 | Discharge: 2013-01-03 | Disposition: A | Discharge status: Home / Self Care | Payer: Medicare Other | Source: Ambulatory Visit | Attending: Radiation Oncology | Admitting: Radiation Oncology

## 2013-01-03 DIAGNOSIS — C3490 Malignant neoplasm of unspecified part of unspecified bronchus or lung: Secondary | ICD-10-CM | POA: Insufficient documentation

## 2013-01-03 LAB — BUN AND CREATININE (CC13): Creatinine: 1.2 mg/dL — ABNORMAL HIGH (ref 0.6–1.1)

## 2013-01-06 ENCOUNTER — Ambulatory Visit (HOSPITAL_COMMUNITY)
Admission: RE | Admit: 2013-01-06 | Discharge: 2013-01-06 | Disposition: A | Discharge status: Home / Self Care | Payer: Medicare Other | Source: Ambulatory Visit | Attending: Radiation Oncology | Admitting: Radiation Oncology

## 2013-01-06 DIAGNOSIS — Z85528 Personal history of other malignant neoplasm of kidney: Secondary | ICD-10-CM | POA: Insufficient documentation

## 2013-01-06 DIAGNOSIS — Z905 Acquired absence of kidney: Secondary | ICD-10-CM | POA: Insufficient documentation

## 2013-01-06 DIAGNOSIS — C3490 Malignant neoplasm of unspecified part of unspecified bronchus or lung: Secondary | ICD-10-CM | POA: Insufficient documentation

## 2013-01-06 DIAGNOSIS — Z923 Personal history of irradiation: Secondary | ICD-10-CM | POA: Insufficient documentation

## 2013-01-06 DIAGNOSIS — R911 Solitary pulmonary nodule: Secondary | ICD-10-CM

## 2013-01-06 DIAGNOSIS — R918 Other nonspecific abnormal finding of lung field: Secondary | ICD-10-CM | POA: Insufficient documentation

## 2013-01-06 DIAGNOSIS — J438 Other emphysema: Secondary | ICD-10-CM | POA: Insufficient documentation

## 2013-01-06 MED ORDER — IOHEXOL 300 MG/ML  SOLN
80.0000 mL | Freq: Once | INTRAMUSCULAR | Status: AC | PRN
  Administered 2013-01-06: 80 mL via INTRAVENOUS

## 2013-01-08 ENCOUNTER — Other Ambulatory Visit: Payer: Medicare Other

## 2013-01-08 DIAGNOSIS — R911 Solitary pulmonary nodule: Secondary | ICD-10-CM

## 2013-01-10 ENCOUNTER — Ambulatory Visit
Admission: RE | Admit: 2013-01-10 | Discharge: 2013-01-10 | Disposition: A | Discharge status: Home / Self Care | Payer: Medicare Other | Source: Ambulatory Visit | Attending: Internal Medicine | Admitting: Internal Medicine

## 2013-01-10 DIAGNOSIS — R928 Other abnormal and inconclusive findings on diagnostic imaging of breast: Secondary | ICD-10-CM

## 2013-01-17 ENCOUNTER — Telehealth: Payer: Self-pay | Admitting: Internal Medicine

## 2013-01-17 NOTE — Telephone Encounter (Signed)
CT chest reviewed 01/06/13;  Original treated lung cancer nodule stable. But anotherone that was 4mm is now 7mm. She probably needs a fu CT chest wo contrast in 3 months but should come into discuss her copd and lung nodule with TP first available or me first available; whichever is earlier  Dr. Kalman Shan, M.D., [facility]C.P Pulmonary and Critical Care Medicine Staff Physician [facility] [place] Pager: [phone], If no answer or between  15:00h - 7:00h: call [phone]  01/17/2013 2:59 PM

## 2013-01-21 NOTE — Telephone Encounter (Signed)
Pt returned Jennifer's call.  I scheduled an appt w/ TP for 01/23/13 @ 9:00 AM as directed below.  Antionette Fairy

## 2013-01-21 NOTE — Telephone Encounter (Signed)
[email or web address].Anothy Bufano, CMA  

## 2013-01-23 ENCOUNTER — Other Ambulatory Visit: Payer: Self-pay

## 2013-01-23 ENCOUNTER — Ambulatory Visit (INDEPENDENT_AMBULATORY_CARE_PROVIDER_SITE_OTHER): Payer: Medicare Other | Admitting: Adult Health

## 2013-01-23 ENCOUNTER — Encounter: Payer: Self-pay | Admitting: Adult Health

## 2013-01-23 VITALS — BP 128/64 | HR 108 | Temp 97.5°F | Ht 66.0 in | Wt 179.0 lb

## 2013-01-23 DIAGNOSIS — J449 Chronic obstructive pulmonary disease, unspecified: Secondary | ICD-10-CM

## 2013-01-23 DIAGNOSIS — R911 Solitary pulmonary nodule: Secondary | ICD-10-CM

## 2013-01-23 NOTE — Patient Instructions (Signed)
#  COPD - Stable disease  - continue oxygen and medications  #Lung cancer  - Appears radiation worked on the right lung mass-no change on CT scan . - Left lower lobe nodule is slightly worse - Followup CT scan of the chest in 3 months   # Followup  - 3 months Dr. Marchelle Gearing

## 2013-01-23 NOTE — Progress Notes (Signed)
Subjective:    Patient ID: JAKAILA NORMENT, female    DOB: Apr 11, 1945, 67 y.o.   MRN: [id number]  HPI #COPD  - O2 dependent since 2004 (CT 2010 - emphysema, CXR - per hx clear in 2013)  - CAT score 29 - May 2013 (dyspnea, fatigue greatest symptom, not much cough. - Doe 100 feet and uses wheel chair for exertion beyond that) - CAT score 18 - Jne 2013  - Gold stage 3-4, fev1 0.8L/34%, Ratio 40, TLC 110%, DLCO 35% In May 2013  - Normal ECHO - May 2006 - Rx Mdds include advair, spiriva, daily prednisone 10mg  and duoneb  - REhab since oct 2012  - Fall 2013: Not a transplant candidate - [facility] transplant team screening  #Recurent AECOPD  - multiple in early 2013 - rx by PMD  - 03/01/2012 office visit - levaquin alone, no pred - Start N. acetylcysteine April 2014   #Ex-smoker  - 35 ppd. Quit 2007   #Baseline head tremor and walks with walkerr.   # Oct/Nov 2010  had s/p right nephrectomy for renal cell cancer discovered following MRI for leg/back pain.   #Also, s/p laminectomy following that in 2011 for "pinched nerve" by Dr Annell Greening.  # Presumed stage 1A NSCLC  -   In July 2013, CT showed 2.5cm spiculared RUL nodule. d PET scan 10/18/11 where Spiculated hypermetabolic right upper lobe lung nodule. This  measures 2.5 x 1.5 cm and a S.U.V. max of 6.1 on image 77.  - ENB 11/29/11: NON Diagnostic - normal resp cells - s.p empriic curative XRT ending Nov 2013 - Dec 2013: RUL mass improved 1.6cm - March 2014 : RUL mass improved 1.4cm  #left lung nodules  - July 2013: LUL/LLL 1.5cm and LL 6-42mm - March 2014: :LUL/LLL 1.5cm and no change LLL 8mm and slightly worse  OV  06/27/2012 Followup for COPD, recurrent COPD and lung nodules - Presents as always with her husband. She has gained weight due to her dietary indiscretion. Overall she reports stabilityy in health and a feeling of well being. COPD cat score is 25 that is improved from last visit but appears worse than her baseline a few years  ago. Perhaps this is a new baseline. See details below.  - In terms of lung cancer lung nodules. She had CT scan of the chest March 2014. This shows progressive improvement in the right upper lobe mass that was radiated. The left upper lobe nodule is stable without change. The left lower lobe nodule is stable or slightly worse; all documented above. She tells me that she is followed with Dr. Michell Heinrich in June 2014 and has a repeat CT scan at that time pending   CAT COPD Symptom & Quality of Life Score ([employer] trademark) 0 is no burden. 5 is highest burden 03/01/2012  06/27/2012   Never Cough -> Cough all the time 3 2  No phlegm in chest -> Chest is full of phlegm 4 5  No chest tightness -> Chest feels very tight 0 0  No dyspnea for 1 flight stairs/hill -> Very dyspneic for 1 flight of stairs 5 5  No limitations for ADL at home -> Very limited with ADL at home 5 5  Confident leaving home -> Not at all confident leaving home 4 3  Sleep soundly -> Do not sleep soundly because of lung condition 4 3  Lots of Energy -> No energy at all 3 2  TOTAL Score (max 40)  28  25   01/23/2013 Follow up COPD  review CT and discuss COPD and lung nodule per 10/31c phone note by MR.  no new complaints. Pt underwent serial CT on 01/06/13 to follow nodules. CT showed no change in right upper lobe lesion. Patient did have a slightly enlarged. Left lower lobe nodule now measuring 7, mm , previously measured 4 mm. No new nodules noted She denies any hemoptysis, orthopnea, PND, leg swelling, or weight loss. Patient reports that her breathing has been at baseline. She continues on Spiriva once daily. She denies flare of cough, wheezing, or increase shortness of breath.   Review of Systems  Constitutional: Negative for fever and unexpected weight change.  HENT: Negative for ear pain, nosebleeds, congestion, sore throat, rhinorrhea, sneezing, trouble swallowing, dental problem, postnasal drip and sinus pressure.   Eyes:  Negative for redness and itching.  Respiratory: Positive for shortness of breath. Negative for cough, chest tightness and wheezing.   Cardiovascular: Negative for palpitations and leg swelling.  Gastrointestinal: Negative for nausea and vomiting.  Genitourinary: Negative for dysuria.  Musculoskeletal: Negative for joint swelling.  Skin: Negative for rash.  Neurological: Negative for headaches.  Hematological: Does not bruise/bleed easily.  Psychiatric/Behavioral: Negative for dysphoric mood. The patient is not nervous/anxious.        Objective:   Physical Exam Vitals reviewed. Constitutional: She is oriented to person, place, and time. She appears well-developed and well-nourished. No distress.       Has general tremor and head shake HENT:  Head: Normocephalic and atraumatic.  Right Ear: External ear normal.  Left Ear: External ear normal.  Mouth/Throat: Oropharynx is clear and moist. No oropharyngeal exudate.  Eyes: Conjunctivae normal and EOM are normal. Pupils are equal, round, and reactive to light. Right eye exhibits no discharge. Left eye exhibits no discharge. No scleral icterus.  Neck: Normal range of motion. Neck supple. No JVD present. No tracheal deviation present. No thyromegaly present.  Cardiovascular: Normal rate, regular rhythm, normal heart sounds and intact distal pulses.  Exam reveals no gallop and no friction rub.   No murmur heard. Pulmonary/Chest: Effort normal and breath sounds normal. No respiratory distress. She has no wheezes. She has no rales. She exhibits no tenderness.       barrell chest  Abdominal: Soft. Bowel sounds are normal. She exhibits no distension and no mass. There is no tenderness. There is no rebound and no guarding.  Musculoskeletal: Normal range of motion. She exhibits no edema and no tenderness.  Lymphadenopathy:    She has no cervical adenopathy.  Neurological: She is alert and oriented to person, place, and time. She has normal reflexes.  No cranial nerve deficit. She exhibits normal muscle tone. Coordination normal.       Tremor + Using walker +  Skin: Skin is warm and dry. No rash noted. She is not diaphoretic. No erythema. No pallor.  Psychiatric: She has a normal mood and affect. Her behavior is normal. Judgment and thought content normal.          Assessment & Plan:

## 2013-01-27 ENCOUNTER — Encounter: Payer: Self-pay | Admitting: Adult Health

## 2013-01-27 NOTE — Assessment & Plan Note (Signed)
Compensated on present regimen  Cont on Spiriva

## 2013-01-27 NOTE — Assessment & Plan Note (Addendum)
Repeat CT chest in 3 months   Appears radiation worked on the right lung mass-no change on CT scan . - Left lower lobe nodule is slightly worse - Followup CT scan of the chest in 3 months  Discussed results in detail w/ pt.

## 2013-03-03 ENCOUNTER — Other Ambulatory Visit: Payer: Self-pay | Admitting: Obstetrics and Gynecology

## 2013-03-03 ENCOUNTER — Other Ambulatory Visit (HOSPITAL_COMMUNITY)
Admission: RE | Admit: 2013-03-03 | Discharge: 2013-03-03 | Disposition: A | Discharge status: Home / Self Care | Payer: Medicare Other | Source: Ambulatory Visit | Attending: Obstetrics and Gynecology | Admitting: Obstetrics and Gynecology

## 2013-03-03 DIAGNOSIS — Z1151 Encounter for screening for human papillomavirus (HPV): Secondary | ICD-10-CM | POA: Insufficient documentation

## 2013-03-03 DIAGNOSIS — Z01419 Encounter for gynecological examination (general) (routine) without abnormal findings: Secondary | ICD-10-CM | POA: Insufficient documentation

## 2013-04-23 ENCOUNTER — Encounter: Payer: Self-pay | Admitting: Internal Medicine

## 2013-04-25 ENCOUNTER — Ambulatory Visit (INDEPENDENT_AMBULATORY_CARE_PROVIDER_SITE_OTHER)
Admission: RE | Admit: 2013-04-25 | Discharge: 2013-04-25 | Disposition: A | Discharge status: Home / Self Care | Payer: Medicare Other | Source: Ambulatory Visit | Attending: Internal Medicine | Admitting: Internal Medicine

## 2013-04-25 DIAGNOSIS — R911 Solitary pulmonary nodule: Secondary | ICD-10-CM

## 2013-05-02 ENCOUNTER — Telehealth: Payer: Self-pay | Admitting: Internal Medicine

## 2013-05-02 NOTE — Telephone Encounter (Signed)
appt set for 05-14-13. [place] Bing, CMA

## 2013-05-02 NOTE — Telephone Encounter (Signed)
One of her nodules on 04/25/13 CT is enlarging but she has no followup   Dr. Brand Males, M.D., [facility].C.P Pulmonary and Critical Care Medicine Staff Physician [facility] Pager: [phone], If no answer or between  15:00h - 7:00h: call [phone]  05/02/2013 12:55 PM

## 2013-05-14 ENCOUNTER — Ambulatory Visit (INDEPENDENT_AMBULATORY_CARE_PROVIDER_SITE_OTHER): Payer: Medicare Other | Admitting: Internal Medicine

## 2013-05-14 ENCOUNTER — Encounter: Payer: Self-pay | Admitting: Internal Medicine

## 2013-05-14 VITALS — BP 120/80 | HR 121 | Ht 66.0 in | Wt 186.2 lb

## 2013-05-14 DIAGNOSIS — J449 Chronic obstructive pulmonary disease, unspecified: Secondary | ICD-10-CM

## 2013-05-14 DIAGNOSIS — R911 Solitary pulmonary nodule: Secondary | ICD-10-CM

## 2013-05-14 DIAGNOSIS — Z23 Encounter for immunization: Secondary | ICD-10-CM

## 2013-05-14 DIAGNOSIS — C3490 Malignant neoplasm of unspecified part of unspecified bronchus or lung: Secondary | ICD-10-CM

## 2013-05-14 MED ORDER — PNEUMOCOCCAL 13-VAL CONJ VACC IM SUSP
0.5000 mL | Freq: Once | INTRAMUSCULAR | Status: DC
Start: 2013-05-14 — End: 2013-05-15

## 2013-05-14 NOTE — Progress Notes (Signed)
Subjective:    Patient ID: Melanie Best, female    DOB: [birth year], 68 y.o.   MRN: [id number]  HPI   #COPD  chronic respiratory failure - O2 dependent since 2004 (CT 2010 - emphysema, CXR - per hx clear in 2013)  -- Gold stage 3-4, fev1 0.8L/34%, Ratio 40, TLC 110%, DLCO 35% In May 2013  - Normal ECHO - May 2006 - Rx Mdds include advair, spiriva, daily prednisone 10mg  and duoneb  - REhab since oct 2012  - Fall 2013: Not a transplant candidate - [facility] transplant team screening  #Recurent AECOPD  - multiple in early 2013 - rx by PMD  - 03/01/2012 office visit - levaquin alone, no pred - Start N. acetylcysteine April 2014   #Ex-smoker  - 35 ppd. Quit 2007   #Baseline head tremor and walks with walkerr.   # Oct/Nov 2010  had s/p right nephrectomy for renal cell cancer discovered following MRI for leg/back pain.   #Also, s/p laminectomy following that in 2011 for "pinched nerve" by Dr Rodell Perna.  # Presumed stage 1A NSCLC - RUL nodule  -   In July 2013, CT showed 2.5cm spiculared RUL nodule. d PET scan 10/18/11 where Spiculated hypermetabolic right upper lobe lung nodule. This  measures 2.5 x 1.5 cm and a S.U.V. max of 6.1 on image 77.  - ENB 11/29/11: NON Diagnostic - normal resp cells - s.p empriic curative XRT ending Nov 2013 - Dec 2013: RUL mass improved 1.6cm - March 2014 : RUL mass improved 1.4cm  #left lung nodules  - July 2013: LUL/LLL 1.5cm and LL 6-67mm - March 2014: :LUL/LLL 1.5cm and no change LLL 65mm and slightly worse - Feb 2015 - LUL. LLL 1.5cm and no change. LLL 52mm and slightly worse    OV 05/14/2013  FU several issues above  Chief Complaint  Patient presents with  . Follow-up    after CT scan. Pt states SOB is not any worse then before. pt denies any cough or congestion at this time.     - COPD chronic respiratory failure: She pulses are stable. I've not seen her in a year almost. She continues a prednisone 10 mg daily along with oxygen and her  nebulizers. She has not had Prevnar vaccine and will have her today.  - Lung cancer right upper lobe: Recent CT scan of the chest Feb 2015 shows his stable  - Left lower lobe lung nodule: 1 nodule 1.5 cm is unchanged. However left lower lobe nodule is now 9 mm in and is worse compared to 2 years ago and it was 6 mm. Am really concerned that this might be cancer. However high false-negative yield with any biopsy procedure and also associated complications make biopsy not a great choice for this patient     CAT COPD Symptom & Quality of Life Score ([employer]i) 0 is no burden. 5 is highest burden 03/01/2012  06/27/2012   Never Cough -> Cough all the time 3 2  No phlegm in chest -> Chest is full of phlegm 4 5  No chest tightness -> Chest feels very tight 0 0  No dyspnea for 1 flight stairs/hill -> Very dyspneic for 1 flight of stairs 5 5  No limitations for ADL at home -> Very limited with ADL at home 5 5  Confident leaving home -> Not at all confident leaving home 4 3  Sleep soundly -> Do not sleep soundly because of lung condition 4 3  Lots of Energy -> No energy at all 3 2  TOTAL Score (max 40)  28 25      Review of Systems  Constitutional: Negative for fever and unexpected weight change.  HENT: Negative for congestion, dental problem, ear pain, nosebleeds, postnasal drip, rhinorrhea, sinus pressure, sneezing, sore throat and trouble swallowing.   Eyes: Negative for redness and itching.  Respiratory: Positive for shortness of breath. Negative for cough, chest tightness and wheezing.   Cardiovascular: Negative for palpitations and leg swelling.  Gastrointestinal: Negative for nausea and vomiting.  Genitourinary: Negative for dysuria.  Musculoskeletal: Negative for joint swelling.  Skin: Negative for rash.  Neurological: Negative for headaches.  Hematological: Does not bruise/bleed easily.  Psychiatric/Behavioral: Negative for dysphoric mood. The patient is not nervous/anxious.         Objective:   Physical Exam  Vitals reviewed. Constitutional: She is oriented to person, place, and time. She appears well-developed and well-nourished. No distress.  singificant weight gain Body mass index is 30.07 kg/(m^2).   HENT:  Head: Normocephalic and atraumatic.  Right Ear: External ear normal.  Left Ear: External ear normal.  Mouth/Throat: Oropharynx is clear and moist. No oropharyngeal exudate.  Head nods with tremor  Eyes: Conjunctivae and EOM are normal. Pupils are equal, round, and reactive to light. Right eye exhibits no discharge. Left eye exhibits no discharge. No scleral icterus.  Neck: Normal range of motion. Neck supple. No JVD present. No tracheal deviation present. No thyromegaly present.  Cardiovascular: Normal rate, regular rhythm, normal heart sounds and intact distal pulses.  Exam reveals no gallop and no friction rub.   No murmur heard. Pulmonary/Chest: Effort normal and breath sounds normal. No respiratory distress. She has no wheezes. She has no rales. She exhibits no tenderness.  Abdominal: Soft. Bowel sounds are normal. She exhibits no distension and no mass. There is no tenderness. There is no rebound and no guarding.  Musculoskeletal: Normal range of motion. She exhibits no edema and no tenderness.  Sits in wheel chair  Lymphadenopathy:    She has no cervical adenopathy.  Neurological: She is alert and oriented to person, place, and time. She has normal reflexes. No cranial nerve deficit. She exhibits normal muscle tone. Coordination normal.  Skin: Skin is warm and dry. No rash noted. She is not diaphoretic. No erythema. No pallor.  Psychiatric: She has a normal mood and affect. Her behavior is normal. Judgment and thought content normal.          Assessment & Plan:

## 2013-05-14 NOTE — Patient Instructions (Addendum)
#  COPD - Stable disease  - continue oxygen and medications but due to weight gain cut down prednisone to 5mg  daily - think about living will issues - have PREVNAR 05/14/2013  #Lung cancer RUL Mass  - Appears radiation worked on the right lung mass-no change on CT scan .  #LLL lung nodule - Left lower lobe nodule is slightly worse; ensure followup with Dr Pablo Ledger - I am worried this is cancer. YOu might need another empiric radiation - Followup CT scan of the chest without in 3 months   # Followup  - 3 months Dr. Chase Caller

## 2013-05-15 NOTE — Assessment & Plan Note (Signed)
#  LLL lung nodule - Left lower lobe nodule is slightly worse; ensure followup with Dr Pablo Ledger - I am worried this is cancer. YOu might need another empiric radiation - Followup CT scan of the chest without in 3 months   # Followup   - 3 months Dr. Chase Caller

## 2013-05-15 NOTE — Assessment & Plan Note (Signed)
#  Lung cancer RUL Mass  - Appears radiation worked on the right lung mass-no change on CT scan .

## 2013-05-15 NOTE — Assessment & Plan Note (Signed)
#  COPD - Stable disease  - continue oxygen and medications but due to weight gain cut down prednisone to 5mg  daily - think about living will issues - have PREVNAR 05/14/2013   # Followup  - 3 months Dr. Chase Caller

## 2013-05-16 ENCOUNTER — Telehealth: Payer: Self-pay | Admitting: Internal Medicine

## 2013-05-16 MED ORDER — PREDNISONE 5 MG PO TABS: 5.0000 mg | ORAL_TABLET | Freq: Every day | ORAL | Status: DC

## 2013-05-16 NOTE — Telephone Encounter (Signed)
Per last ov note from MR---decreased the pred to 5 mg daily.   This rx has been sent to the pts pharmacy and the pt is aware.

## 2013-05-29 ENCOUNTER — Ambulatory Visit: Payer: Medicare Other | Admitting: Radiation Oncology

## 2013-06-05 ENCOUNTER — Ambulatory Visit
Admission: RE | Admit: 2013-06-05 | Discharge: 2013-06-05 | Disposition: A | Discharge status: Home / Self Care | Payer: Medicare Other | Source: Ambulatory Visit | Attending: Radiation Oncology | Admitting: Radiation Oncology

## 2013-06-05 ENCOUNTER — Encounter: Payer: Self-pay | Admitting: Radiation Oncology

## 2013-06-05 VITALS — BP 105/70 | HR 122 | Temp 98.3°F | Resp 24 | Ht 66.0 in | Wt 183.7 lb

## 2013-06-05 DIAGNOSIS — C3490 Malignant neoplasm of unspecified part of unspecified bronchus or lung: Secondary | ICD-10-CM

## 2013-06-05 NOTE — Progress Notes (Signed)
Follow up   RT  Lung rad txs, 01/09/12;01/11/12;01/15/12;01/17/12;01/19/12 50 Gy ,CT chest results 04/25/13, patient appetite good, on 2 liters oxygen n/c=96%, sob rr=24,  No coughing, mild fatigue, no nausea, no pain stated 1:16 PM

## 2013-06-05 NOTE — Progress Notes (Signed)
Department of Radiation Oncology  Phone: [phone]  Fax: [phone]   Name: Melanie Best DOB: May 07, 1945  MRN: [id number]   Date: 06/05/2013  Follow Up Visit Note   Diagnosis: T1N0 NSCLC (no biopsy) of the right upper lobe   Interval History: Melanie Best presents today for routine followup. She has some coughing which is normal for her. No fevers or chills. Her breathing is stable. Her oxygen levels are stable. She had a CT of the chest on 04/25/2013 which showed no adenopathy. The right upper lobe lesion with stable. A nodule in the left lower lobe had increased from previous. She discussed this nodule with Dr. Chase Caller who referred her to me for discussion of treatment options. Her breathing is stable. It she is on a stable level oxygen. She denies any hemoptysis or weight loss.   Allergies: No Known Allergies  Medications:  Current Outpatient Prescriptions   Medication  Sig  Dispense  Refill   .  albuterol (PROVENTIL HFA;VENTOLIN HFA) 108 (90 BASE) MCG/ACT inhaler  Inhale 2 puffs into the lungs every 4 (four) hours as needed. For shortness of breath     .  albuterol-ipratropium (COMBIVENT) 18-103 MCG/ACT inhaler  Inhale 2 puffs into the lungs every 6 (six) hours as needed. For shortness of breath/wheezing     .  alum & mag hydroxide-simeth (MAALOX/MYLANTA) 200-200-20 MG/5ML suspension  Take 30 mLs by mouth every 6 (six) hours as needed (dyspepsia).  355 mL  3   .  bimatoprost (LUMIGAN) 0.01 % SOLN  Place 1 drop into both eyes at bedtime.     .  Calcium Carb-Cholecalciferol (CALCIUM 500 +D) 500-400 MG-UNIT TABS  Take 1 tablet by mouth 2 times daily at 12 noon and 4 pm.     .  clonazePAM (KLONOPIN) 1 MG tablet  Take 1 mg by mouth 2 (two) times daily as needed. For anxiety     .  diltiazem (CARDIZEM CD) 180 MG 24 hr capsule  Take 180 mg by mouth daily.     .  dorzolamide (TRUSOPT) 2 % ophthalmic solution  Place 1 drop into the right eye Three times a day.     .  ergocalciferol  (VITAMIN D2) 50000 UNITS capsule  Take 50,000 Units by mouth once a week. Take on Monday     .  esomeprazole (NEXIUM) 40 MG capsule  Take 40 mg by mouth daily before breakfast.     .  furosemide (LASIX) 20 MG tablet  Take 20 mg by mouth daily as needed. For fluid retention     .  HYDROcodone-acetaminophen (VICODIN) 5-500 MG per tablet  Take 1-2 tablets by mouth every 6 (six) hours as needed. For pain     .  hydrOXYzine (ATARAX/VISTARIL) 25 MG tablet  Take 25 mg by mouth every 8 (eight) hours as needed. For itching     .  ipratropium-albuterol (DUONEB) 0.5-2.5 (3) MG/3ML SOLN  Take 3 mLs by nebulization 4 (four) times daily as needed. For shortness of breath     .  lovastatin (MEVACOR) 20 MG tablet  Take 20 mg by mouth at bedtime.     .  metFORMIN (GLUCOPHAGE) 500 MG tablet  Take 500 mg by mouth daily with breakfast.     .  prednisoLONE acetate (PRED FORTE) 1 % ophthalmic suspension  Apply 1 drop to eye daily as needed. For acute iritis     .  predniSONE (DELTASONE) 10 MG tablet  Take 10 mg by mouth daily.     Marland Kitchen  promethazine (PHENERGAN) 25 MG tablet  Take 25 mg by mouth every 6 (six) hours as needed. For nausea     .  tiotropium (SPIRIVA) 18 MCG inhalation capsule  Place 18 mcg into inhaler and inhale daily with breakfast.     Physical Exam:  Wt Readings from Last 3 Encounters:  06/05/13 183 lb 11.2 oz (83.326 kg)  05/14/13 186 lb 3.2 oz (84.46 kg)  01/23/13 179 lb (81.194 kg)   Temp Readings from Last 3 Encounters:  06/05/13 98.3 F (36.8 C) Oral  01/23/13 97.5 F (36.4 C) Oral  09/06/12 99 F (37.2 C) Oral   BP Readings from Last 3 Encounters:  06/05/13 105/70  05/14/13 120/80  01/23/13 128/64   Pulse Readings from Last 3 Encounters:  06/05/13 122  05/14/13 121  01/23/13 108   She is a pleasant female in no respiratory distress sitting with a nasal cannula in place   IMPRESSION: Melanie Best is a 68 y.o. female status post SBRT to a right upper lobe lesion with  an enlarging left  lower lobe lesion  PLAN:  I went over the scans with her and her husband. I showed him that she has multiple lesions throughout her bilateral lungs. We did go back several years and look at this lesion in the left lower lobe which was not present and is enlarging. She would like to proceed forward with treatment right now rather than waiting for it to grow which I think is reasonable. She doesn't have any concerning adenopathy so I think we can skip a PET scan which she was happy to do do to financial concerns. She understands again that this could be something other than lung cancer and that we aren't sure to know without biopsy which clearly is very high-risk procedure again in her at this time. I've scheduled her for her SBRT simulation on March 26. She will begin her treatment the second week of April when she returns from a trip in [place] with her sisters. We discussed that her breathing had worsened again with this radiation treatment but this is an even smaller lesion hopefully we will be able to keep the dose to her regular lung to a very small volume.

## 2013-06-06 ENCOUNTER — Telehealth: Payer: Self-pay

## 2013-06-06 NOTE — Telephone Encounter (Signed)
Left message for patient to return call if she has any questions regarding ct sim appointment for Thursday 06/12/13 at 3:00 pm as she called on yesterday inquiring about "what appointment".

## 2013-06-10 ENCOUNTER — Other Ambulatory Visit: Payer: Self-pay | Admitting: Internal Medicine

## 2013-06-10 ENCOUNTER — Ambulatory Visit
Admission: RE | Admit: 2013-06-10 | Discharge: 2013-06-10 | Disposition: A | Discharge status: Home / Self Care | Payer: Medicare Other | Source: Ambulatory Visit | Attending: Internal Medicine | Admitting: Internal Medicine

## 2013-06-10 DIAGNOSIS — R079 Chest pain, unspecified: Secondary | ICD-10-CM

## 2013-06-12 ENCOUNTER — Ambulatory Visit
Admission: RE | Admit: 2013-06-12 | Discharge: 2013-06-12 | Disposition: A | Discharge status: Home / Self Care | Payer: Medicare Other | Source: Ambulatory Visit | Attending: Radiation Oncology | Admitting: Radiation Oncology

## 2013-06-12 DIAGNOSIS — C3490 Malignant neoplasm of unspecified part of unspecified bronchus or lung: Secondary | ICD-10-CM

## 2013-06-12 DIAGNOSIS — C3410 Malignant neoplasm of upper lobe, unspecified bronchus or lung: Secondary | ICD-10-CM | POA: Insufficient documentation

## 2013-06-12 DIAGNOSIS — Z510 Encounter for antineoplastic radiation therapy: Secondary | ICD-10-CM | POA: Insufficient documentation

## 2013-06-12 NOTE — Progress Notes (Signed)
[facility] Simulation and Treatment Planning Note   Name: Melanie Best MRN: [id number]  Date: 06/12/2013  DOB: 1945-05-17  Status: outpatient  DIAGNOSIS: There were no encounter diagnoses.  SIDE: left  CONSENT VERIFIED: yes  SET UP AND IMMOBILIZATION: Patient is setup supine in a vac loc with a custom moldable pillow for head and neck immobilization   NARRATIVE: The patient was brought to the [facility].  Identity was confirmed.  All relevant records and images related to the planned course of therapy were reviewed.  Then, the patient was positioned in a stable reproducible clinical set-up for radiation therapy.  CT images were obtained.  Skin markings were placed.  A four dimensional simulation was then performed to track tumor movement throughout the patients' breathing cycle. The CT images were loaded into the planning software where the target and avoidance structures were contoured.  The GTV was outlined on the free breathing, 4D and MIP image sets.  The radiation prescription was entered and confirmed.   TREATMENT PLANNING NOTE:  Treatment planning then occurred. I have requested 3D simulation with [facility] of the spinal cord, total lungs and gross tumor volume. I have also requested mlcs and an isodose plan.   Special treatment procedure will be performed as [name] will be receiving high dose per fraction.

## 2013-06-24 ENCOUNTER — Ambulatory Visit
Admission: RE | Admit: 2013-06-24 | Discharge: 2013-06-24 | Disposition: A | Discharge status: Home / Self Care | Payer: Medicare Other | Source: Ambulatory Visit | Attending: Radiation Oncology | Admitting: Radiation Oncology

## 2013-06-24 ENCOUNTER — Encounter: Payer: Self-pay | Admitting: Radiation Oncology

## 2013-06-24 VITALS — BP 115/71 | HR 112 | Temp 98.8°F | Resp 22 | Wt 182.3 lb

## 2013-06-24 DIAGNOSIS — C3490 Malignant neoplasm of unspecified part of unspecified bronchus or lung: Secondary | ICD-10-CM

## 2013-06-24 NOTE — Progress Notes (Signed)
Weekly Management Note Current Dose:  12 Gy  Projected Dose: 60 Gy   Narrative:  The patient presents for routine under treatment assessment.  CBCT/MVCT images/Port film x-rays were reviewed.  The chart was checked. Doing well. No complaints.   Physical Findings: Weight: 182 lb 4.8 oz (82.691 kg). Unchanged  Impression:  The patient is tolerating radiation.  Plan:  Continue treatment as planned.

## 2013-06-24 NOTE — Progress Notes (Signed)
  Radiation Oncology         [phone] ________________________________  Name: Melanie Best MRN: [id number]  Date: 06/24/2013  DOB: [birth year]  Stereotactic Body Radiotherapy Treatment Procedure Note  NARRATIVE:  Melanie Best was brought to the stereotactic radiation treatment machine and placed supine on the CT couch. The patient was set up for stereotactic body radiotherapy on the body fix pillow.  3D TREATMENT PLANNING AND DOSIMETRY:  The patient's radiation plan was reviewed and approved prior to starting treatment.  It showed 3-dimensional radiation distributions overlaid onto the planning CT.  [facility] for the target structures as well as the organs at risk were reviewed. The documentation of this is filed in the radiation oncology EMR.  SIMULATION VERIFICATION:  The patient underwent CT imaging on the treatment unit.  These were carefully aligned to document that the ablative radiation dose would cover the target volume and maximally spare the nearby organs at risk according to the planned distribution.  SPECIAL TREATMENT PROCEDURE: [name] received high dose ablative stereotactic body radiotherapy to the planned target volume without unforeseen complications. Treatment was delivered uneventfully. The high doses associated with stereotactic body radiotherapy and the significant potential risks require careful treatment set up and patient monitoring constituting a special treatment procedure   STEREOTACTIC TREATMENT MANAGEMENT:  Following delivery, the patient was evaluated clinically. The patient tolerated treatment without significant acute effects, and was discharged to home in stable condition.    PLAN: Continue treatment as planned.  _________________________   Thea Silversmith, MD

## 2013-06-24 NOTE — Progress Notes (Signed)
SBRT tx LLL 1/5 completed, 97% 2 liters n/c oxygen, sob with mild exertion, no c/o pain, is fatigued , coughs up yellowish phelgm, no difficulty swallowing food or fluids 6:11 PM

## 2013-06-26 ENCOUNTER — Ambulatory Visit: Payer: Medicare Other | Admitting: Radiation Oncology

## 2013-06-27 ENCOUNTER — Ambulatory Visit: Payer: Medicare Other

## 2013-06-27 ENCOUNTER — Ambulatory Visit: Payer: Medicare Other | Admitting: Radiation Oncology

## 2013-06-30 ENCOUNTER — Ambulatory Visit
Admission: RE | Admit: 2013-06-30 | Discharge: 2013-06-30 | Disposition: A | Discharge status: Home / Self Care | Payer: Medicare Other | Source: Ambulatory Visit | Attending: Radiation Oncology | Admitting: Radiation Oncology

## 2013-06-30 DIAGNOSIS — C3490 Malignant neoplasm of unspecified part of unspecified bronchus or lung: Secondary | ICD-10-CM

## 2013-06-30 NOTE — Progress Notes (Signed)
  Radiation Oncology         [phone]) [phone] ________________________________  Name: Melanie Best MRN: [id number]  Date: 06/30/2013  DOB: 04/28/1945  Stereotactic Body Radiotherapy Treatment Procedure Note  NARRATIVE:  Melanie Best was brought to the stereotactic radiation treatment machine and placed supine on the CT couch. The patient was set up for stereotactic body radiotherapy on the body fix pillow.  3D TREATMENT PLANNING AND DOSIMETRY:  The patient's radiation plan was reviewed and approved prior to starting treatment.  It showed 3-dimensional radiation distributions overlaid onto the planning CT.  The [facility] for the target structures as well as the organs at risk were reviewed. The documentation of this is filed in the radiation oncology EMR.  SIMULATION VERIFICATION:  The patient underwent CT imaging on the treatment unit.  These were carefully aligned to document that the ablative radiation dose would cover the target volume and maximally spare the nearby organs at risk according to the planned distribution.  SPECIAL TREATMENT PROCEDURE: Melanie Best received high dose ablative stereotactic body radiotherapy to the planned target volume without unforeseen complications. Treatment was delivered uneventfully. The high doses associated with stereotactic body radiotherapy and the significant potential risks require careful treatment set up and patient monitoring constituting a special treatment procedure   STEREOTACTIC TREATMENT MANAGEMENT:  Following delivery, the patient was evaluated clinically. The patient tolerated treatment without significant acute effects, and was discharged to home in stable condition.    PLAN: Continue treatment as planned.  ________________________________  Blair Promise, PhD, MD

## 2013-07-02 ENCOUNTER — Ambulatory Visit
Admission: RE | Admit: 2013-07-02 | Discharge: 2013-07-02 | Disposition: A | Discharge status: Home / Self Care | Payer: Medicare Other | Source: Ambulatory Visit | Attending: Radiation Oncology | Admitting: Radiation Oncology

## 2013-07-02 DIAGNOSIS — C3490 Malignant neoplasm of unspecified part of unspecified bronchus or lung: Secondary | ICD-10-CM

## 2013-07-02 NOTE — Progress Notes (Signed)
  Radiation Oncology         [phone] ________________________________  Name: Melanie Best MRN: [id number]  Date: 07/02/2013  DOB: 1945/07/24  Stereotactic Body Radiotherapy Treatment Procedure Note (fraction 4 of 5)  NARRATIVE:  Melanie Best was brought to the stereotactic radiation treatment machine and placed supine on the CT couch. The patient was set up for stereotactic body radiotherapy on the body fix pillow.  3D TREATMENT PLANNING AND DOSIMETRY:  The patient's radiation plan was reviewed and approved prior to starting treatment.  It showed 3-dimensional radiation distributions overlaid onto the planning CT.  The [facility] for the target structures as well as the organs at risk were reviewed. The documentation of this is filed in the radiation oncology EMR.  SIMULATION VERIFICATION:  The patient underwent CT imaging on the treatment unit.  These were carefully aligned to document that the ablative radiation dose would cover the target volume and maximally spare the nearby organs at risk according to the planned distribution.  SPECIAL TREATMENT PROCEDURE: Melanie Best received high dose ablative stereotactic body radiotherapy to the planned target volume without unforeseen complications. Treatment was delivered uneventfully. The high doses associated with stereotactic body radiotherapy and the significant potential risks require careful treatment set up and patient monitoring constituting a special treatment procedure   STEREOTACTIC TREATMENT MANAGEMENT:  Following delivery, the patient was evaluated clinically. The patient tolerated treatment without significant acute effects, and was discharged to home in stable condition.    PLAN: Continue treatment as planned.  _________________________   Thea Silversmith, MD

## 2013-07-03 ENCOUNTER — Ambulatory Visit
Admission: RE | Admit: 2013-07-03 | Discharge: 2013-07-03 | Disposition: A | Discharge status: Home / Self Care | Payer: Medicare Other | Source: Ambulatory Visit | Attending: Internal Medicine | Admitting: Internal Medicine

## 2013-07-03 ENCOUNTER — Other Ambulatory Visit: Payer: Self-pay | Admitting: Internal Medicine

## 2013-07-03 DIAGNOSIS — R110 Nausea: Secondary | ICD-10-CM

## 2013-07-04 ENCOUNTER — Ambulatory Visit
Admission: RE | Admit: 2013-07-04 | Discharge: 2013-07-04 | Disposition: A | Discharge status: Home / Self Care | Payer: Medicare Other | Source: Ambulatory Visit | Attending: Radiation Oncology | Admitting: Radiation Oncology

## 2013-07-07 ENCOUNTER — Ambulatory Visit
Admission: RE | Admit: 2013-07-07 | Discharge: 2013-07-07 | Disposition: A | Discharge status: Home / Self Care | Payer: Medicare Other | Source: Ambulatory Visit | Attending: Radiation Oncology | Admitting: Radiation Oncology

## 2013-07-07 ENCOUNTER — Encounter: Payer: Self-pay | Admitting: Radiation Oncology

## 2013-07-07 DIAGNOSIS — C3490 Malignant neoplasm of unspecified part of unspecified bronchus or lung: Secondary | ICD-10-CM

## 2013-07-07 NOTE — Progress Notes (Signed)
  Radiation Oncology         [phone]) [phone] ________________________________  Name: Melanie Best MRN: [id number]  Date: 07/07/2013  DOB: [birth year]   Simulation verification note  The patient underwent film verification for the patient's set-up in preparation for stereotactic body radiosurgery. The patient was placed on the treatment unit and a CT scan was performed. These images were then fused with the patient's planning CT scan. The fusion was carefully reviewed in terms of the patient's anatomy as it related to the planning CT scan. The target structures as well as the organs at risk were evaluated on the patient's treatment CT scan. The target and the normal structures were appropriately aligned for treatment. Therefore the patient proceeded with the fraction of stereotactic body radiosurgery.  Fraction: 4  Dose:  48 Gy   ________________________________  Jodelle Gross, MD, PhD

## 2013-07-14 NOTE — Progress Notes (Signed)
  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   She had no ill effects   Plan: The patient has completed radiation treatment. The patient will return to radiation oncology clinic for routine followup in one month. I advised them to call or return sooner if they have any questions or concerns related to their recovery or treatment.  ------------------------------------------------  Thea Silversmith, MD

## 2013-07-21 ENCOUNTER — Encounter (INDEPENDENT_AMBULATORY_CARE_PROVIDER_SITE_OTHER): Payer: Self-pay | Admitting: Surgery

## 2013-07-22 ENCOUNTER — Ambulatory Visit (INDEPENDENT_AMBULATORY_CARE_PROVIDER_SITE_OTHER): Payer: Self-pay | Admitting: General Surgery

## 2013-07-23 ENCOUNTER — Encounter: Payer: Self-pay | Admitting: Radiation Oncology

## 2013-07-25 ENCOUNTER — Ambulatory Visit (INDEPENDENT_AMBULATORY_CARE_PROVIDER_SITE_OTHER): Payer: Medicare Other | Admitting: Surgery

## 2013-08-05 ENCOUNTER — Ambulatory Visit (INDEPENDENT_AMBULATORY_CARE_PROVIDER_SITE_OTHER): Payer: Medicare Other | Admitting: Surgery

## 2013-08-05 ENCOUNTER — Encounter (INDEPENDENT_AMBULATORY_CARE_PROVIDER_SITE_OTHER): Payer: Self-pay | Admitting: Surgery

## 2013-08-05 ENCOUNTER — Encounter (INDEPENDENT_AMBULATORY_CARE_PROVIDER_SITE_OTHER): Payer: Self-pay | Admitting: General Surgery

## 2013-08-05 VITALS — BP 120/80 | HR 78 | Temp 97.8°F | Resp 16 | Ht 66.0 in | Wt 172.0 lb

## 2013-08-05 DIAGNOSIS — K8020 Calculus of gallbladder without cholecystitis without obstruction: Secondary | ICD-10-CM

## 2013-08-05 NOTE — Progress Notes (Signed)
Patient ID: Melanie Best, female   DOB: 11-20-1945, 68 y.o.   MRN: [id number]  Chief Complaint  Patient presents with  . New Evaluation    eval gallbladder    HPI Melanie Best is a 68 y.o. female.   HPI She is referred by Dr. Benita Stabile for evaluation of cholelithiasis.  She has been having nausea for the last several months.  It is always in the morning and is not related to anything she eats.  There is no emesis.  She does have some vague bilateral upper abdominal pain. She also has constipation Past Medical History  Diagnosis Date  . Leukocytosis   . Glaucoma   . Urinary incontinence   . Dyslipidemia   . Osteopenia   . Anxiety   . Tremor   . Syncope   . Diabetes mellitus, type 2   . DVT (deep venous thrombosis), left     2011- treated /w coumadin   . Anemia   . Vitamin D deficiency   . Shortness of breath   . CKD (chronic kidney disease), stage II     removed R kidney- 2010- for Cancer, followed by Dr. Diona Fanti  . Arthritis     R leg  . Hypertension     sees Dr. Deforest Hoyles for PCP, denies ever having stress or ECHO, ?when & where she had  last  EKG  . Esophageal reflux   . COPD (chronic obstructive pulmonary disease)     uses O2-2 liters , continuously , followed by Dr. Chase Caller  . Cancer     kidney  . Lung cancer     NSCLC  . DM (diabetes mellitus) 01/27/2012  . Hyperlipidemia 01/27/2012  . HTN (hypertension) 01/27/2012  . GERD (gastroesophageal reflux disease) 01/27/2012  . History of radiation therapy 01/09/12,01/11/12,01/15/12,01/17/12,& 01/19/12    rul lung,50Gy/57fx    Past Surgical History  Procedure Laterality Date  . Right nephrectomy    . Back surgery      for pinched nerve, 2011, here at [facility]  . Eye surgery      cataracts removed, ?IOL  . Flexible bronchoscopy  11/29/2011    Family History  Problem Relation Age of Onset  . Emphysema Father   . Cancer Sister     behind eye  . Cancer Brother     lung  . Cancer Brother     lung  . Cancer  Brother     throat    Social History History  Substance Use Topics  . Smoking status: Former Smoker -- 1.00 packs/day for 35 years    Types: Cigarettes    Quit date: 03/20/2004  . Smokeless tobacco: Never Used  . Alcohol Use: No    No Known Allergies  Current Outpatient Prescriptions  Medication Sig Dispense Refill  . albuterol (PROVENTIL HFA;VENTOLIN HFA) 108 (90 BASE) MCG/ACT inhaler Inhale 2 puffs into the lungs every 4 (four) hours as needed. For shortness of breath      . bimatoprost (LUMIGAN) 0.01 % SOLN Place 1 drop into both eyes at bedtime.       . brinzolamide (AZOPT) 1 % ophthalmic suspension Place 1 drop into both eyes 3 (three) times daily.      . clonazePAM (KLONOPIN) 1 MG tablet Take 1 mg by mouth 2 (two) times daily as needed. For anxiety      . ergocalciferol (VITAMIN D2) 50000 UNITS capsule Take 50,000 Units by mouth once a week. Take on Monday      .  esomeprazole (NEXIUM) 40 MG capsule Take 40 mg by mouth daily before breakfast.       . furosemide (LASIX) 20 MG tablet Take 20 mg by mouth daily as needed. For fluid retention      . hydrOXYzine (ATARAX/VISTARIL) 25 MG tablet Take 25 mg by mouth every 8 (eight) hours as needed. For itching      . ipratropium-albuterol (DUONEB) 0.5-2.5 (3) MG/3ML SOLN Take 3 mLs by nebulization 4 (four) times daily as needed.      Marland Kitchen JANUVIA 50 MG tablet       . OXYGEN-HELIUM IN Inhale into the lungs. 2 liters      . prednisoLONE acetate (PRED FORTE) 1 % ophthalmic suspension Apply 1 drop to eye daily as needed. For acute iritis      . predniSONE (DELTASONE) 5 MG tablet Take 1 tablet (5 mg total) by mouth daily with breakfast.  30 tablet  1  . tiotropium (SPIRIVA) 18 MCG inhalation capsule Place 18 mcg into inhaler and inhale daily with breakfast.       . Calcium Carb-Cholecalciferol (CALCIUM 500 +D) 500-400 MG-UNIT TABS Take 1 tablet by mouth 2 times daily at 12 noon and 4 pm.       . lovastatin (MEVACOR) 20 MG tablet Take 20 mg by  mouth at bedtime.      . triamcinolone cream (KENALOG) 0.1 %        No current facility-administered medications for this visit.    Review of Systems Review of Systems  Constitutional: Negative for fever, chills and unexpected weight change.  HENT: Negative for congestion, hearing loss, sore throat, trouble swallowing and voice change.   Eyes: Negative for visual disturbance.  Respiratory: Positive for shortness of breath. Negative for cough and wheezing.   Cardiovascular: Positive for chest pain. Negative for palpitations and leg swelling.  Gastrointestinal: Positive for nausea and abdominal pain. Negative for vomiting, diarrhea, constipation, blood in stool, abdominal distention and anal bleeding.  Genitourinary: Negative for hematuria, vaginal bleeding and difficulty urinating.  Musculoskeletal: Positive for arthralgias, back pain, gait problem and joint swelling.  Skin: Negative for rash and wound.  Neurological: Negative for seizures, syncope and headaches.  Hematological: Negative for adenopathy. Does not bruise/bleed easily.  Psychiatric/Behavioral: Negative for confusion.    Blood pressure 120/80, pulse 78, temperature 97.8 F (36.6 C), temperature source Temporal, resp. rate 16, height 5\' 6"  (1.676 m), weight 172 lb (78.019 kg).  Physical Exam Physical Exam  Constitutional: She is oriented to person, place, and time.  Wheel chair bound, on oxygen, SOB  HENT:  Head: Normocephalic and atraumatic.  Right Ear: External ear normal.  Left Ear: External ear normal.  Mouth/Throat: Oropharynx is clear and moist. No oropharyngeal exudate.  Eyes: Conjunctivae are normal. Pupils are equal, round, and reactive to light. Right eye exhibits no discharge. Left eye exhibits no discharge. No scleral icterus.  Neck: Normal range of motion. Neck supple. No tracheal deviation present.  Cardiovascular: Normal rate, regular rhythm, normal heart sounds and intact distal pulses.    Pulmonary/Chest: Effort normal and breath sounds normal. No respiratory distress. She has no wheezes.  Abdominal: Soft. There is no tenderness. There is no rebound and no guarding.  Musculoskeletal: Normal range of motion. She exhibits no edema and no tenderness.  Lymphadenopathy:    She has no cervical adenopathy.  Neurological: She is alert and oriented to person, place, and time.  Skin: Skin is warm and dry. No rash noted. No erythema.  Psychiatric:  Her behavior is normal. Judgment normal.    Data Reviewed   Assessment    cholelithiasis     Plan    It is difficult to tell whether or not she is even symptomatic from the stones.  None the less, she is not a candidate for general anesthesia given her severe respiratory situation.  Unfortunately, all that can be offered would IR placing a perc cholecystostomy if she develops cholecystitis.  I will see her PRN        Harl Bowie 08/05/2013, 2:20 PM

## 2013-08-08 ENCOUNTER — Ambulatory Visit: Payer: Medicare Other | Admitting: Internal Medicine

## 2013-08-17 ENCOUNTER — Other Ambulatory Visit: Payer: Self-pay | Admitting: Internal Medicine

## 2013-08-28 ENCOUNTER — Ambulatory Visit (INDEPENDENT_AMBULATORY_CARE_PROVIDER_SITE_OTHER)
Admission: RE | Admit: 2013-08-28 | Discharge: 2013-08-28 | Disposition: A | Discharge status: Home / Self Care | Payer: Medicare Other | Source: Ambulatory Visit | Attending: Internal Medicine | Admitting: Internal Medicine

## 2013-08-28 DIAGNOSIS — C3490 Malignant neoplasm of unspecified part of unspecified bronchus or lung: Secondary | ICD-10-CM

## 2013-08-29 ENCOUNTER — Telehealth: Payer: Self-pay | Admitting: Internal Medicine

## 2013-08-29 NOTE — Telephone Encounter (Signed)
Pt scheduled an appt to see MR for 09/08/13. Nothing further needed

## 2013-08-29 NOTE — Telephone Encounter (Signed)
I last saw Melanie Best in feb 2015 and supposed to have 3 month followup but I see none pending. Please give her fu to see me or [name] the NP for copd and lung nodule.   Also, let her know she had CT chest 08/28/13 and let her know that nodules are same or shrunk  Thanks  Dr. Brand Males, M.D., [facility].C.P Pulmonary and Critical Care Medicine Staff Physician [place] [facility] Pager: [phone], If no answer or between  15:00h - 7:00h: call [phone]  08/29/2013 7:26 AM      Ct Chest Wo Contrast  08/28/2013   CLINICAL DATA:  History of lung cancer and renal cancer. Prior history of radiation therapy to the lungs.  EXAM: CT CHEST WITHOUT CONTRAST  TECHNIQUE: Multidetector CT imaging of the chest was performed following the standard protocol without IV contrast.  COMPARISON:  Chest CT 04/25/2013.  FINDINGS: Mediastinum: Heart size is normal. There is no significant pericardial fluid, thickening or pericardial calcification. There is atherosclerosis of the thoracic aorta, the great vessels of the mediastinum and the coronary arteries, including calcified atherosclerotic plaque in the proximal left anterior descending coronary artery. No pathologically enlarged mediastinal are hilar lymph nodes. Please note that accurate exclusion of hilar adenopathy is limited on noncontrast CT scans. Esophagus is unremarkable in appearance.  Lungs/Pleura: Again noted are numerous pulmonary nodules scattered throughout the lungs bilaterally. On the prior study, a left lower lobe nodule has slightly enlarged measuring 9 mm at that time, however, on today's examination this same nodule currently measures only 7 mm (image 32 of series 3), decrease in size compared to the most recent prior examination and compatible with size seen on prior study 01/06/2013. Treated lesion in the right upper lobe appears similar to prior examinations, currently measuring approximately 1.6 x  1.0 cm (image 19 of series 3). 16 mm left lower lobe nodule (image 36 of series 3) is unchanged compared to numerous prior examinations dating back to at least 08/27/2012 (but is new compared to more remote prior studies dating back to 02/16/2009). 7 mm nodule in the medial aspect of the left lower lobe (image 47 of series 3) is unchanged. 4 mm right lower lobe nodule (image 35 of series 3) is unchanged. No acute consolidative airspace disease. No pleural effusions. Diffuse bronchial wall thickening with moderate to severe centrilobular and paraseptal emphysema, most pronounced in the lung apices.  Upper Abdomen: Mild diffuse decreased attenuation throughout the hepatic parenchyma, compatible with hepatic steatosis.  Musculoskeletal: There are no aggressive appearing lytic or blastic lesions noted in the visualized portions of the skeleton.  IMPRESSION: 1. The nodule of concern on the prior study measuring 9 mm in the left lower lobe has decreased in size, currently measuring only 7 mm on today's examination. All other previously noted pulmonary nodules are stable in size, as detailed above. No new nodules are noted. 2. Diffuse bronchial wall thickening with moderate to severe centrilobular and paraseptal emphysema; imaging findings suggestive of underlying COPD. 3. Atherosclerosis, including left anterior descending coronary artery disease. Please note that although the presence of coronary artery calcium documents the presence of coronary artery disease, the severity of this disease and any potential stenosis cannot be assessed on this non-gated CT examination. Assessment for potential risk factor modification, dietary therapy or pharmacologic therapy may be warranted, if clinically indicated. 4. Hepatic steatosis.   Electronically Signed   By: Mauri Brooklyn.D.  On: 08/28/2013 15:55

## 2013-09-09 ENCOUNTER — Encounter: Payer: Self-pay | Admitting: Internal Medicine

## 2013-09-09 ENCOUNTER — Ambulatory Visit (INDEPENDENT_AMBULATORY_CARE_PROVIDER_SITE_OTHER): Payer: Medicare Other | Admitting: Internal Medicine

## 2013-09-09 VITALS — BP 140/90 | HR 98 | Ht 66.0 in | Wt 170.0 lb

## 2013-09-09 DIAGNOSIS — C3411 Malignant neoplasm of upper lobe, right bronchus or lung: Secondary | ICD-10-CM

## 2013-09-09 DIAGNOSIS — C3432 Malignant neoplasm of lower lobe, left bronchus or lung: Secondary | ICD-10-CM

## 2013-09-09 DIAGNOSIS — J449 Chronic obstructive pulmonary disease, unspecified: Secondary | ICD-10-CM

## 2013-09-09 MED ORDER — PREDNISONE 2 MG PO TBEC: 4.0000 mg | DELAYED_RELEASE_TABLET | Freq: Every day | ORAL | Status: DC

## 2013-09-09 MED ORDER — FLUTICASONE-SALMETEROL 100-50 MCG/DOSE IN AEPB: 1.0000 | INHALATION_SPRAY | Freq: Two times a day (BID) | RESPIRATORY_TRACT | Status: DC

## 2013-09-09 NOTE — Patient Instructions (Addendum)
#  Lung cancer Right Upper Lobe and Left Lower Lobe Presumed lung cancer - glad you completed radiation in April 2015  - Left lower lobe lung cancer is improved - needs followup CT chest in dec 2015; will order at followup (6 month CT)  #COPD and chronic resp failure - stable - continue daily o2 - continue prednisone but decrease to 4mg  day and take 90 days supply with refills  - continue spiriva daily  - continue advair (RN wil ensure  You are on this) twice daily  - continue duoneb as needed   #Followup  3 months or sooner if needed

## 2013-09-09 NOTE — Progress Notes (Signed)
Subjective:    Patient ID: Melanie Best, female    DOB: Dec 09, 1945, 68 y.o.   MRN: [id number]  HPI  #COPD  chronic respiratory failure - O2 dependent since 2004 (CT 2010 - emphysema, CXR - per hx clear in 2013)  -- Gold stage 3-4, fev1 0.8L/34%, Ratio 40, TLC 110%, DLCO 35% In May 2013  - Normal ECHO - May 2006 - Rx Mdds include advair, spiriva, daily prednisone 10mg  and duoneb  -> reduced to 4mg  pred 09/09/2013 - REhab since oct 2012  - Fall 2013: Not a transplant candidate - [facility] transplant team screening  #Recurent AECOPD  - multiple in early 2013 - rx by PMD  - 03/01/2012 office visit - levaquin alone, no pred - Start N. acetylcysteine April 2014   #Ex-smoker  - 35 ppd. Quit 2007   #Baseline head tremor and walks with walkerr.   # Oct/Nov 2010  had s/p right nephrectomy for renal cell cancer discovered following MRI for leg/back pain.   #Also, s/p laminectomy following that in 2011 for "pinched nerve" by Dr Rodell Perna.  # Presumed stage 1A NSCLC - RUL nodule  -   In July 2013, CT showed 2.5cm spiculared RUL nodule. d PET scan 10/18/11 where Spiculated hypermetabolic right upper lobe lung nodule. This  measures 2.5 x 1.5 cm and a S.U.V. max of 6.1 on image 77.  - ENB 11/29/11: NON Diagnostic - normal resp cells - s.p empriic curative XRT ending Nov 2013 - Dec 2013: RUL mass improved 1.6cm - March 2014 : RUL mass improved 1.4cm  #left lung nodules  - July 2013: LUL/LLL 1.5cm and LL 6-75mm - March 2014: :LUL/LLL 1.5cm and no change LLL 61mm and slightly worse - Feb 2015 - LUL. LLL 1.5cm and no change. LLL 57mm and slightly worse -> XRT ending April 2015 - June 2015:  LUL/LLL - ? Same, LLL 50mm and better    OV 09/09/2013  Chief Complaint  Patient presents with  . Follow-up    Breathing unchanged since last OV.   Reports has no concerns today      - COPD with chronic respiratory failure: She continues spiriva and o2 daily. Not sure if she is on advair. Takes duoneb  prn. Prednisone cut down to 5mg  daily and this helped lose weight and improved dyspnea and quality of life. She is willing to drop this to 4mg  per day  - Lung cancer right upper lobe: Recent CT scan of the chest Feb 2015 and [id number] shows his stable  - Left lower lobe lung nodule - presumed lung cancer recurrence:   She finished empiric  XRT April 2015.  Followup June 2015: 1 nodule 1.5 cm is unchanged. However the targeted left lower lobe nodule is now 83m (was 9 mm) and improved.     IMPRESSION:  1. The nodule of concern on the prior study measuring 9 mm in the  left lower lobe has decreased in size, currently measuring only 7 mm  on today's examination. All other previously noted pulmonary nodules  are stable in size, as detailed above. No new nodules are noted.  2. Diffuse bronchial wall thickening with moderate to severe  centrilobular and paraseptal emphysema; imaging findings suggestive  of underlying COPD.  3. Atherosclerosis, including left anterior descending coronary  artery disease. Please note that although the presence of coronary  artery calcium documents the presence of coronary artery disease,  the severity of this disease and any potential stenosis cannot be  assessed on this non-gated CT examination. Assessment for potential  risk factor modification, dietary therapy or pharmacologic therapy  may be warranted, if clinically indicated.  4. Hepatic steatosis.  Electronically Signed  By: Vinnie Langton M.D.  On: 08/28/2013 15:55     CAT COPD Symptom & Quality of Life Score ([place]) 0 is no burden. 5 is highest burden 03/01/2012  06/27/2012   Never Cough -> Cough all the time 3 2  No phlegm in chest -> Chest is full of phlegm 4 5  No chest tightness -> Chest feels very tight 0 0  No dyspnea for 1 flight stairs/hill -> Very dyspneic for 1 flight of stairs 5 5  No limitations for ADL at home -> Very limited with ADL at home 5 5  Confident leaving home -> Not  at all confident leaving home 4 3  Sleep soundly -> Do not sleep soundly because of lung condition 4 3  Lots of Energy -> No energy at all 3 2  TOTAL Score (max 40)  28 25        Review of Systems  Constitutional: Negative for fever and unexpected weight change.  HENT: Negative for congestion, dental problem, ear pain, nosebleeds, postnasal drip, rhinorrhea, sinus pressure, sneezing, sore throat and trouble swallowing.   Eyes: Negative for redness and itching.  Respiratory: Positive for shortness of breath. Negative for cough, chest tightness and wheezing.   Cardiovascular: Positive for leg swelling. Negative for palpitations.  Gastrointestinal: Negative for nausea and vomiting.  Genitourinary: Negative for dysuria.  Musculoskeletal: Positive for joint swelling.       Pain in (R) shoulder  Skin: Negative for rash.  Neurological: Negative for headaches.  Hematological: Does not bruise/bleed easily.  Psychiatric/Behavioral: Negative for dysphoric mood. The patient is not nervous/anxious.    Current outpatient prescriptions:bimatoprost (LUMIGAN) 0.01 % SOLN, Place 1 drop into both eyes at bedtime. , Disp: , Rfl: ;  brinzolamide (AZOPT) 1 % ophthalmic suspension, Place 1 drop into both eyes 3 (three) times daily., Disp: , Rfl: ;  Calcium Carb-Cholecalciferol (CALCIUM 500 +D) 500-400 MG-UNIT TABS, Take 1 tablet by mouth 2 times daily at 12 noon and 4 pm. , Disp: , Rfl:  clonazePAM (KLONOPIN) 1 MG tablet, Take 1 mg by mouth 2 (two) times daily as needed. For anxiety, Disp: , Rfl: ;  ergocalciferol (VITAMIN D2) 50000 UNITS capsule, Take 50,000 Units by mouth once a week. Take on Monday, Disp: , Rfl: ;  esomeprazole (NEXIUM) 40 MG capsule, Take 40 mg by mouth daily before breakfast. , Disp: , Rfl: ;  furosemide (LASIX) 20 MG tablet, Take 20 mg by mouth daily as needed. For fluid retention, Disp: , Rfl:  hydrOXYzine (ATARAX/VISTARIL) 25 MG tablet, Take 25 mg by mouth every 8 (eight) hours as  needed. For itching, Disp: , Rfl: ;  ipratropium-albuterol (DUONEB) 0.5-2.5 (3) MG/3ML SOLN, Take 3 mLs by nebulization 4 (four) times daily as needed., Disp: , Rfl: ;  JANUVIA 50 MG tablet, , Disp: , Rfl: ;  lovastatin (MEVACOR) 20 MG tablet, Take 20 mg by mouth at bedtime., Disp: , Rfl: ;  OXYGEN-HELIUM IN, Inhale into the lungs. 2 liters, Disp: , Rfl:  prednisoLONE acetate (PRED FORTE) 1 % ophthalmic suspension, Apply 1 drop to eye daily as needed. For acute iritis, Disp: , Rfl: ;  predniSONE (DELTASONE) 5 MG tablet, take 1 tablet by mouth once daily WITH BREAKFAST, Disp: 30 tablet, Rfl: 0;  tiotropium (SPIRIVA) 18 MCG inhalation capsule, Place  18 mcg into inhaler and inhale daily with breakfast. , Disp: , Rfl:      Objective:   Physical Exam  Filed Vitals:   09/09/13 1055  BP: 140/90  Pulse: 98  Height: 5\' 6"  (1.676 m)  Weight: 170 lb (77.111 kg)  SpO2: 97%    Vitals reviewed. Constitutional: She is oriented to person, place, and time. She appears well-developed and well-nourished. No distress.  singificant weight loss after pred taper Body mass index is 27.45 kg/(m^2).    HENT:  Head: Normocephalic and atraumatic.  Right Ear: External ear normal.  Left Ear: External ear normal.  Mouth/Throat: Oropharynx is clear and moist. No oropharyngeal exudate.  Head nods with tremor as before Eyes: Conjunctivae and EOM are normal. Pupils are equal, round, and reactive to light. Right eye exhibits no discharge. Left eye exhibits no discharge. No scleral icterus.  Neck: Normal range of motion. Neck supple. No JVD present. No tracheal deviation present. No thyromegaly present.  Cardiovascular: Normal rate, regular rhythm, normal heart sounds and intact distal pulses.  Exam reveals no gallop and no friction rub.   No murmur heard. Pulmonary/Chest: Effort normal and breath sounds normal. No respiratory distress. She has no wheezes. She has no rales. She exhibits no tenderness.  Abdominal: Soft.  Bowel sounds are normal. She exhibits no distension and no mass. There is no tenderness. There is no rebound and no guarding.  Musculoskeletal: Normal range of motion. She exhibits no edema and no tenderness.  Sits in wheel chair  Lymphadenopathy:    She has no cervical adenopathy.  Neurological: She is alert and oriented to person, place, and time. She has normal reflexes. No cranial nerve deficit. She exhibits normal muscle tone. Coordination normal.  Skin: Skin is warm and dry. No rash noted. She is not diaphoretic. No erythema. No pallor.  Psychiatric: She has a normal mood and affect. Her behavior is normal. Judgment and thought content normal.            Assessment & Plan:

## 2013-09-13 DIAGNOSIS — J449 Chronic obstructive pulmonary disease, unspecified: Secondary | ICD-10-CM | POA: Insufficient documentation

## 2013-09-13 NOTE — Assessment & Plan Note (Signed)
#  Lung cancer Right Upper Lobe and Left Lower Lobe Presumed lung cancer - glad you completed radiation in April 2015  - Left lower lobe lung cancer is improved - needs followup CT chest in dec 2015; will order at followup (6 month CT)

## 2013-09-13 NOTE — Assessment & Plan Note (Signed)
 #  COPD and chronic resp failure - stable - continue daily o2 - continue prednisone but decrease to 4mg  day and take 90 days supply with refills  - continue spiriva daily  - continue advair (RN wil ensure  You are on this) twice daily  - continue duoneb as needed   #Followup  3 months or sooner if needed

## 2013-10-27 ENCOUNTER — Telehealth: Payer: Self-pay | Admitting: Internal Medicine

## 2013-10-27 ENCOUNTER — Other Ambulatory Visit: Payer: Self-pay | Admitting: Internal Medicine

## 2013-10-27 MED ORDER — FLUTICASONE-SALMETEROL 100-50 MCG/DOSE IN AEPB: 1.0000 | INHALATION_SPRAY | Freq: Two times a day (BID) | RESPIRATORY_TRACT | Status: DC

## 2013-10-27 NOTE — Telephone Encounter (Signed)
Received patient assistance refill request from [facility] for Advair 100/50 for 90 day supply with 3 refills. Rx mailed back to company. Nothing further needed.

## 2013-11-10 ENCOUNTER — Other Ambulatory Visit: Payer: Self-pay | Admitting: Emergency Medicine

## 2013-11-10 MED ORDER — FLUTICASONE-SALMETEROL 100-50 MCG/DOSE IN AEPB: 1.0000 | INHALATION_SPRAY | Freq: Two times a day (BID) | RESPIRATORY_TRACT | Status: DC

## 2013-12-22 ENCOUNTER — Other Ambulatory Visit: Payer: Self-pay

## 2013-12-22 DIAGNOSIS — Z1231 Encounter for screening mammogram for malignant neoplasm of breast: Secondary | ICD-10-CM

## 2014-01-05 ENCOUNTER — Encounter: Payer: Self-pay | Admitting: Internal Medicine

## 2014-01-05 ENCOUNTER — Ambulatory Visit (INDEPENDENT_AMBULATORY_CARE_PROVIDER_SITE_OTHER): Payer: Medicare Other | Admitting: Internal Medicine

## 2014-01-05 VITALS — BP 130/70 | HR 106 | Ht 66.0 in | Wt 172.4 lb

## 2014-01-05 DIAGNOSIS — C3411 Malignant neoplasm of upper lobe, right bronchus or lung: Secondary | ICD-10-CM

## 2014-01-05 NOTE — Patient Instructions (Addendum)
#  Lung cancer Right Upper Lobe and Left Lower Lobe Presumed lung cancer - glad you completed radiation in April 2015  - Left lower lobe lung cancer is improved - do non-contrast fllowup CT chest in dec 2015  #COPD and chronic resp failure - stable - continue daily o2 - continue prednisone at 4mg  daily  - continue spiriva daily  - continue advair  twice daily  - continue duoneb as needed   #Followup  return to see my NP [name] in Dec 2015 after CT chest
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
## 2014-01-12 ENCOUNTER — Ambulatory Visit
Admission: RE | Admit: 2014-01-12 | Discharge: 2014-01-12 | Disposition: A | Discharge status: Home / Self Care | Payer: Medicare Other | Source: Ambulatory Visit

## 2014-01-12 DIAGNOSIS — Z1231 Encounter for screening mammogram for malignant neoplasm of breast: Secondary | ICD-10-CM

## 2014-03-02 ENCOUNTER — Ambulatory Visit (INDEPENDENT_AMBULATORY_CARE_PROVIDER_SITE_OTHER)
Admission: RE | Admit: 2014-03-02 | Discharge: 2014-03-02 | Disposition: A | Discharge status: Home / Self Care | Payer: Medicare Other | Source: Ambulatory Visit | Attending: Internal Medicine | Admitting: Internal Medicine

## 2014-03-02 DIAGNOSIS — C3411 Malignant neoplasm of upper lobe, right bronchus or lung: Secondary | ICD-10-CM

## 2014-03-06 ENCOUNTER — Ambulatory Visit (INDEPENDENT_AMBULATORY_CARE_PROVIDER_SITE_OTHER): Payer: Medicare Other | Admitting: Adult Health

## 2014-03-06 ENCOUNTER — Encounter: Payer: Self-pay | Admitting: Adult Health

## 2014-03-06 VITALS — BP 110/66 | HR 86 | Temp 98.6°F | Ht 66.0 in | Wt 166.0 lb

## 2014-03-06 DIAGNOSIS — C3432 Malignant neoplasm of lower lobe, left bronchus or lung: Secondary | ICD-10-CM

## 2014-03-06 DIAGNOSIS — C3411 Malignant neoplasm of upper lobe, right bronchus or lung: Secondary | ICD-10-CM

## 2014-03-06 DIAGNOSIS — J449 Chronic obstructive pulmonary disease, unspecified: Secondary | ICD-10-CM

## 2014-03-06 NOTE — Assessment & Plan Note (Signed)
CT reviewed w/ pt and remains stable without evidence of disease progression .  Cont to monitor with planned CT in 6 months with Dr. Chase Caller

## 2014-03-06 NOTE — Assessment & Plan Note (Signed)
COPD is stable without flare on current regimen Immunizations utd  Plan  Cont on current regimen  follow up Dr. Chase Caller in 3 months and As needed

## 2014-03-06 NOTE — Assessment & Plan Note (Signed)
CT reviewed with pt and remains stable without evidence of disease progression

## 2014-03-06 NOTE — Patient Instructions (Addendum)
#  COPD - Stable disease  - continue oxygen and medications  #Lung cancer  -  Appears stable on CT .  - Followup CT scan of the chest in 6 months   # Followup  -3  months Dr. Chase Caller
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
## 2014-03-27 ENCOUNTER — Encounter: Payer: Self-pay | Admitting: Neurology

## 2014-03-27 ENCOUNTER — Ambulatory Visit (INDEPENDENT_AMBULATORY_CARE_PROVIDER_SITE_OTHER): Payer: Medicare Other | Admitting: Neurology

## 2014-03-27 VITALS — BP 118/64 | HR 72

## 2014-03-27 DIAGNOSIS — G243 Spasmodic torticollis: Secondary | ICD-10-CM

## 2014-03-27 DIAGNOSIS — J42 Unspecified chronic bronchitis: Secondary | ICD-10-CM

## 2014-03-27 DIAGNOSIS — G250 Essential tremor: Secondary | ICD-10-CM

## 2014-03-27 NOTE — Progress Notes (Signed)
Subjective:   Melanie Best was seen in consultation in the movement disorder clinic at the request of [name], MD.  The evaluation is for tremor.  Pt is accompanied by her husband who supplements the history.  Pt reports that it has been going on for over 20 years.  It started in head but it went to the hands about a year ago.  It bothers her in the hands as she has trouble writing and dialing a phone.  The head doesn't bother her.  Her grandmother and brother both had tremor.  Pt was given primidone on 03/03/14 and took one pill and it made her dizzy and she didn't take it again.  She has not been on any other tremor medications.  Affected by caffeine:  unknown (drinks 2-3 cans pepsi per day) Affected by alcohol:  Doesn't drink Affected by stress:  No. Affected by fatigue:  No. Spills soup if on spoon:  Yes.   Spills glass of liquid if full:  No. Affects ADL's (tying shoes, brushing teeth, etc):  No.  Current/Previously tried tremor medications: klonopin (takes 1 mg bid for anxiety)  Current medications that may exacerbate tremor:  Prednisone (4mg  currently), albuterol/atrovent (uses tid),  Outside reports reviewed: historical medical records, lab reports and referral letter/letters.  No Known Allergies  Outpatient Encounter Prescriptions as of 03/27/2014  Medication Sig  . aspirin 81 MG tablet Take 81 mg by mouth daily.  . bimatoprost (LUMIGAN) 0.01 % SOLN Place 1 drop into both eyes at bedtime.   . brinzolamide (AZOPT) 1 % ophthalmic suspension Place 1 drop into both eyes 3 (three) times daily.  . Calcium Carb-Cholecalciferol (CALCIUM 500 +D) 500-400 MG-UNIT TABS Take 1 tablet by mouth 2 times daily at 12 noon and 4 pm.   . clonazePAM (KLONOPIN) 1 MG tablet Take 1 mg by mouth 2 (two) times daily as needed. For anxiety  . ergocalciferol (VITAMIN D2) 50000 UNITS capsule Take 50,000 Units by mouth once a week. Take on Monday  . esomeprazole (NEXIUM) 40 MG capsule Take 40 mg by  mouth daily before breakfast.   . furosemide (LASIX) 20 MG tablet Take 20 mg by mouth daily as needed. For fluid retention  . hydrOXYzine (ATARAX/VISTARIL) 25 MG tablet Take 25 mg by mouth every 8 (eight) hours as needed. For itching  . ipratropium-albuterol (DUONEB) 0.5-2.5 (3) MG/3ML SOLN Take 3 mLs by nebulization 4 (four) times daily as needed.  Marland Kitchen JANUVIA 50 MG tablet Take 50 mg by mouth daily.   Marland Kitchen lovastatin (MEVACOR) 20 MG tablet Take 20 mg by mouth at bedtime.  Donell Sievert IN Inhale into the lungs. 2 liters  . prednisoLONE acetate (PRED FORTE) 1 % ophthalmic suspension Apply 1 drop to eye daily as needed. For acute iritis  . PredniSONE 2 MG TBEC Take 4 mg by mouth daily.  Marland Kitchen HYDROcodone-acetaminophen (NORCO/VICODIN) 5-325 MG per tablet Take 1 tablet by mouth as needed.  . [DISCONTINUED] CARTIA XT 180 MG 24 hr capsule Take 1 capsule by mouth daily.  . [DISCONTINUED] Fluticasone-Salmeterol (ADVAIR DISKUS) 100-50 MCG/DOSE AEPB Inhale 1 puff into the lungs 2 (two) times daily.  . [DISCONTINUED] tiotropium (SPIRIVA) 18 MCG inhalation capsule Place 18 mcg into inhaler and inhale daily.    Past Medical History  Diagnosis Date  . Leukocytosis   . Glaucoma   . Urinary incontinence   . Dyslipidemia   . Osteopenia   . Anxiety   . Tremor   . Syncope   .  Diabetes mellitus, type 2   . DVT (deep venous thrombosis), left     2011- treated /w coumadin   . Anemia   . Vitamin D deficiency   . Shortness of breath   . CKD (chronic kidney disease), stage II     removed R kidney- 2010- for Cancer, followed by Dr. Diona Fanti  . Arthritis     R leg  . Hypertension     sees Dr. Deforest Hoyles for PCP, denies ever having stress or ECHO, ?when & where she had  last  EKG  . Esophageal reflux   . COPD (chronic obstructive pulmonary disease)     uses O2-2 liters , continuously , followed by Dr. Chase Caller  . Cancer     kidney  . Lung cancer     NSCLC  . DM (diabetes mellitus) 01/27/2012  .  Hyperlipidemia 01/27/2012  . HTN (hypertension) 01/27/2012  . GERD (gastroesophageal reflux disease) 01/27/2012  . History of radiation therapy 01/09/12,01/11/12,01/15/12,01/17/12,& 01/19/12    rul lung,50Gy/31fx    Past Surgical History  Procedure Laterality Date  . Right nephrectomy    . Back surgery      for pinched nerve, 2011, here at [facility]  . Eye surgery      cataracts removed, ?IOL  . Flexible bronchoscopy  11/29/2011    History   Social History  . Marital Status: Married    Spouse Name: N/A    Number of Children: N/A  . Years of Education: N/A   Occupational History  . Not on file.   Social History Main Topics  . Smoking status: Former Smoker -- 1.00 packs/day for 35 years    Types: Cigarettes    Quit date: 03/20/2004  . Smokeless tobacco: Never Used  . Alcohol Use: No  . Drug Use: No  . Sexual Activity: Not on file   Other Topics Concern  . Not on file   Social History Narrative    Family Status  Relation Status Death Age  . Mother Alive     HTN  . Father Deceased     emphysema  . Sister Alive     cancer   . Brother Deceased     throat cancer  . Brother Deceased     lung cancer  . Brother Deceased     lung cancer  . Brother Deceased   . Sister Deceased     emphysema  . Sister Alive     healthy  . Daughter Alive     breast cancer  . Daughter Alive     healthy  . Son Alive     healthy  . Son Alive     healthy    Review of Systems A complete 10 system ROS was obtained and was negative apart from what is mentioned.   Objective:   VITALS:   Filed Vitals:   03/27/14 1357  BP: 118/64  Pulse: 72   Gen:  Appears stated age and in NAD. HEENT:  Normocephalic, atraumatic. The mucous membranes are moist. The superficial temporal arteries are without ropiness or tenderness. Cardiovascular: Regular rate and rhythm. Lungs: Clear to auscultation bilaterally.  Wears O2.   Neck: There are no carotid bruits noted bilaterally.  musculoskeletal: Head  is held in the flexed position.  There is hypertrophy of the left sternocleidomastoid muscle.  NEUROLOGICAL:  Orientation:  The patient is alert and oriented x 3.  Recent and remote memory are intact.  Attention span and concentration are normal.  Able  to name objects and repeat without trouble.  Fund of knowledge is appropriate Cranial nerves: There is good facial symmetry. The pupils are equal round and reactive to light bilaterally. Fundoscopic exam is attempted but the disc margins are not well visualized bilaterally. Extraocular muscles are intact and visual fields are full to confrontational testing. Speech is fluent and clear. Soft palate rises symmetrically and there is no tongue deviation. Hearing is intact to conversational tone. Tone: Tone is good throughout. Sensation: Sensation is intact to light touch and pinprick throughout (facial, trunk, extremities). Vibration is intact at the bilateral big toe. There is no extinction with double simultaneous stimulation. There is no sensory dermatomal level identified. Coordination:  The patient has no dysdiadichokinesia or dysmetria. Motor: Strength is 5/5 in the bilateral upper and lower extremities.  Shoulder shrug is equal bilaterally.  There is no pronator drift.  There are no fasciculations noted. DTR's: Deep tendon reflexes are 2/4 at the bilateral biceps, triceps, brachioradialis, patella and  Absent at the bilateralachilles.  Plantar responses are downgoing bilaterally. Gait and Station: The patient is able to ambulate without difficulty.  She does have dyspnea on exertion.  MOVEMENT EXAM: Tremor:  There is tremor in the UE, noted most significantly with action.  The patient is able to draw Archimedes spirals without significant difficulty.   She does have some intention tremor.  There is  No significant tremor at rest.  The patient is able to pour water from one glass to another without spilling it the tremor is evident , especially when  she holds the glass there is full of water with the right hand.  Head tremor in the "no" direction.    LABS:  02/24/14 HgbA1C 6.0     Assessment/Plan:   1.  Cervical dystonia  -This is the cause of her head tremor.  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  Talked to her about treatments, including botox, but I really don't recommend in her and it doesn't bother her.  She doesn't want treatment for it 2.  Hand tremor  -May have some ET, but likely exacerbated if not due to the multiple pulmonary medications used to treat her COPD.   Her primary care physician gave her primidone, which I absolutely agree with.  She only tried one pill and got dizzy and discontinued it. She may have had some degree of first dose effect and I talked to her about that today. In the end, we decided to just have her split the pill in half for the first week and then try to go up to 1 pill at night. I did tell her that she will likely have some degree of tremor as long as she is needing medications for her COPD.  - Will check her TSH. 3.  We'll plan on following up with her in the next few months, sooner should new neurologic issues arise.

## 2014-03-27 NOTE — Patient Instructions (Signed)
1. Your provider has requested that you have labwork completed today. Please go to [facility] on the first floor of this building before leaving the office today. 2. Re-start Primidone taking 1/2 tablets daily for one week, then increase to 1 tablet at night.

## 2014-03-28 LAB — TSH: TSH: 0.537 u[IU]/mL (ref 0.350–4.500)

## 2014-06-05 ENCOUNTER — Ambulatory Visit (INDEPENDENT_AMBULATORY_CARE_PROVIDER_SITE_OTHER)
Admission: RE | Admit: 2014-06-05 | Discharge: 2014-06-05 | Disposition: A | Discharge status: Home / Self Care | Payer: Medicare Other | Source: Ambulatory Visit | Attending: Adult Health | Admitting: Adult Health

## 2014-06-05 ENCOUNTER — Telehealth: Payer: Self-pay | Admitting: Adult Health

## 2014-06-05 DIAGNOSIS — C3411 Malignant neoplasm of upper lobe, right bronchus or lung: Secondary | ICD-10-CM

## 2014-06-05 DIAGNOSIS — C3432 Malignant neoplasm of lower lobe, left bronchus or lung: Secondary | ICD-10-CM

## 2014-06-05 NOTE — Telephone Encounter (Signed)
Left message to call back  

## 2014-06-08 NOTE — Telephone Encounter (Signed)
Pt returned call. Pt stated her nephrologist wanted an abdominal CT while having her chest CT. Informed pt she has already had her CT chest. Informed pt to call her nephrologist to let them know she has already had her chest CT. Pt verbalized understanding and denied any further questions or concerns at this time.

## 2014-06-08 NOTE — Telephone Encounter (Signed)
LMTCB x2  

## 2014-06-09 NOTE — Progress Notes (Signed)
Quick Note:  Called spoke with patient, advised of CT results/recs as stated by TP. Pt voiced her understanding and denied any questions at this time. Appt scheduled with MR for 4.12.16 @ 3.30pm - pt okay with this date and time. Pt aware to call back for sooner follow up if needed. ______

## 2014-06-26 ENCOUNTER — Ambulatory Visit: Payer: Medicare Other | Admitting: Neurology

## 2014-06-30 ENCOUNTER — Ambulatory Visit (INDEPENDENT_AMBULATORY_CARE_PROVIDER_SITE_OTHER): Payer: Medicare Other | Admitting: Internal Medicine

## 2014-06-30 ENCOUNTER — Encounter: Payer: Self-pay | Admitting: Internal Medicine

## 2014-06-30 VITALS — BP 120/68 | HR 94 | Ht 66.0 in | Wt 165.0 lb

## 2014-06-30 DIAGNOSIS — N289 Disorder of kidney and ureter, unspecified: Secondary | ICD-10-CM | POA: Diagnosis not present

## 2014-06-30 DIAGNOSIS — C649 Malignant neoplasm of unspecified kidney, except renal pelvis: Secondary | ICD-10-CM

## 2014-06-30 DIAGNOSIS — J449 Chronic obstructive pulmonary disease, unspecified: Secondary | ICD-10-CM

## 2014-06-30 DIAGNOSIS — C3432 Malignant neoplasm of lower lobe, left bronchus or lung: Secondary | ICD-10-CM

## 2014-06-30 DIAGNOSIS — C3411 Malignant neoplasm of upper lobe, right bronchus or lung: Secondary | ICD-10-CM | POA: Diagnosis not present

## 2014-06-30 NOTE — Patient Instructions (Addendum)
#   lung cancer - glad you completed radiation in April 2015  - Left lower lobe lung cancer is improved - do non-contrast fllowup CT chest in Oct 2016  #renal cell cancer  - do surveillance CT abd/pelvis without contrast in Oct 2016  #COPD and chronic resp failure - stable - continue daily o2 - continue prednisone at 4mg  daily  - continue duoneb daily as needed - respect desiure not to take spiriva and advair   #Followup  return to see my NP [name] in Oct 2016 after CT chest and abdomen

## 2014-06-30 NOTE — Progress Notes (Signed)
Subjective:    Patient ID: Melanie Best, female    DOB: [birth year], 69 y.o.   MRN: [id number]  HPI  #Rt renal cell cancer Aug 2010 - Dr Diona Fanti Large right renal cell carcinoma with extension into the right renal vein and inferior vena cava  #CRI  - creat 1.2mg % Oct 2014  #COPD  chronic respiratory failure - O2 dependent since 2004 (CT 2010 - emphysema, CXR - per hx clear in 2013)  -- Gold stage 3-4, fev1 0.8L/34%, Ratio 40, TLC 110%, DLCO 35% In May 2013  - Normal ECHO - May 2006 - Rx Mdds include advair, spiriva, daily prednisone 10mg  and duoneb  -> reduced to 4mg  pred 09/09/2013 - REhab since oct 2012  - Fall 2013: Not a transplant candidate - [facility] transplant team screening  #Recurent AECOPD  - multiple in early 2013 - rx by PMD  - 03/01/2012 office visit - levaquin alone, no pred - Start N. acetylcysteine April 2014   #Ex-smoker  - 35 ppd. Quit 2007   #Baseline head tremor and walks with walkerr.   # Oct/Nov 2010  had s/p right nephrectomy for renal cell cancer discovered following MRI for leg/back pain.   #Also, s/p laminectomy following that in 2011 for "pinched nerve" by Dr Rodell Perna.  # Presumed stage 1A NSCLC - RUL nodule  -   In July 2013, CT showed 2.5cm spiculared RUL nodule. d PET scan 10/18/11 where Spiculated hypermetabolic right upper lobe lung nodule. This  measures 2.5 x 1.5 cm and a S.U.V. max of 6.1 on image 77.  - ENB 11/29/11: NON Diagnostic - normal resp cells - s.p empriic curative XRT ending Nov 2013 - Dec 2013: RUL mass improved 1.6cm - March 2014 : RUL mass improved 1.4cm - March 2016 - RUL nodule - down to 1.2cm  #left lung nodules  - July 2013: LUL/LLL 1.5cm and LL 6-43mm - March 2014: :LUL/LLL 1.5cm and no change LLL 8mm and slightly worse - Feb 2015 - LUL. LLL 1.5cm and no change. LLL 54mm and slightly worse -> XRT ending April 2015 - June 2015:  LUL/LLL - ? Same, LLL 27mm and better - March 2016:  stable  ///////////////////////////////////////////////////////////////////////////////////////  OV 06/30/2014  Chief Complaint  Patient presents with  . Follow-up    Pt stated her breathing is unchanged since last OV. Pt c/o DOE. Pt denies cough and CP./tightness.        FU COPD with chronic respiratory failure- stable. Denies admission, ER visits, pred bursts,. DuoNeb with daily prednisone tablet. She's not doing Spiriva and Advair anymore for unclear reasons. She's not interested in them. She spends most of the day watching TV and playing with a computer    Fu lung cancer- s/p xrt. In April 2015. Surveillance CT scan March 2060 shows stability. She needs surveillance repeat CT scan of the chest in 6 months.  Renal cancer.: She is being followed by [facility] for the same. But she tells me that they've asked me to do the surveillance CT of the abdomen at the time she has a surveillance CT scan of the chest. She is unsure when she had her last CT abdomen. I will just associated CT abdomen when she does his CT chest. Her urologist is Dr. Preston Fleeting   Vitals reviewed. Constitutional: She is oriented to person, place, and time. She appears well-developed and well-nourished. No distress.  Body mass index is 27.45 kg/(m^2).- June 2015 Body mass index is 27.84 kg/(m^2).  01/05/2014 Body mass index is 26.64 kg/(m^2). 06/30/2014     HENT:  Head: Normocephalic and atraumatic.  Right Ear: External ear normal.  Left Ear: External ear normal.  Mouth/Throat: Oropharynx is clear and moist. No oropharyngeal exudate.  Head nods with tremor as before Eyes: Conjunctivae and EOM are normal. Pupils are equal, round, and reactive to light. Right eye exhibits no discharge. Left eye exhibits no discharge. No scleral icterus.  Neck: Normal range of motion. Neck supple. No JVD present. No tracheal deviation present. No thyromegaly present.  Cardiovascular: Normal rate, regular rhythm, normal  heart sounds and intact distal pulses.  Exam reveals no gallop and no friction rub.   No murmur heard. Pulmonary/Chest: Effort normal and breath sounds normal. No respiratory distress. She has no wheezes. She has no rales. She exhibits no tenderness.  Abdominal: Soft. Bowel sounds are normal. She exhibits no distension and no mass. There is no tenderness. There is no rebound and no guarding.  Musculoskeletal: Normal range of motion. She exhibits no edema and no tenderness.  Sits in wheel chair  Lymphadenopathy:    She has no cervical adenopathy.  Neurological: She is alert and oriented to person, place, and time. She has normal reflexes. No cranial nerve deficit. She exhibits normal muscle tone. Coordination normal.  Skin: Skin is warm and dry. No rash noted. She is not diaphoretic. No erythema. No pallor.  Psychiatric: She has a normal mood and affect. Her behavior is normal. Judgment and thought content normal.        Current outpatient prescriptions:  .  aspirin 81 MG tablet, Take 81 mg by mouth daily., Disp: , Rfl:  .  bimatoprost (LUMIGAN) 0.01 % SOLN, Place 1 drop into both eyes at bedtime. , Disp: , Rfl:  .  brinzolamide (AZOPT) 1 % ophthalmic suspension, Place 1 drop into both eyes 3 (three) times daily., Disp: , Rfl:  .  Calcium Carb-Cholecalciferol (CALCIUM 500 +D) 500-400 MG-UNIT TABS, Take 1 tablet by mouth 2 times daily at 12 noon and 4 pm. , Disp: , Rfl:  .  clonazePAM (KLONOPIN) 1 MG tablet, Take 1 mg by mouth 2 (two) times daily as needed. For anxiety, Disp: , Rfl:  .  ergocalciferol (VITAMIN D2) 50000 UNITS capsule, Take 50,000 Units by mouth once a week. Take on Monday, Disp: , Rfl:  .  esomeprazole (NEXIUM) 40 MG capsule, Take 40 mg by mouth daily before breakfast. , Disp: , Rfl:  .  furosemide (LASIX) 20 MG tablet, Take 20 mg by mouth daily as needed. For fluid retention, Disp: , Rfl:  .  hydrOXYzine (ATARAX/VISTARIL) 25 MG tablet, Take 25 mg by mouth every 8 (eight)  hours as needed. For itching, Disp: , Rfl:  .  ipratropium-albuterol (DUONEB) 0.5-2.5 (3) MG/3ML SOLN, Take 3 mLs by nebulization 4 (four) times daily as needed., Disp: , Rfl:  .  JANUVIA 50 MG tablet, Take 50 mg by mouth daily. , Disp: , Rfl:  .  lovastatin (MEVACOR) 20 MG tablet, Take 20 mg by mouth at bedtime., Disp: , Rfl:  .  OXYGEN-HELIUM IN, Inhale into the lungs. 2 liters, Disp: , Rfl:  .  prednisoLONE acetate (PRED FORTE) 1 % ophthalmic suspension, Apply 1 drop to eye daily as needed. For acute iritis, Disp: , Rfl:  .  PredniSONE 2 MG TBEC, Take 4 mg by mouth daily., Disp: 90 tablet, Rfl: 1 .  primidone (MYSOLINE) 50 MG tablet, Take 50 mg by mouth at bedtime., Disp: ,  Rfl:  .  traMADol (ULTRAM) 50 MG tablet, Take 50 mg by mouth 3 (three) times daily as needed., Disp: , Rfl:   No Known Allergies   Review of Systems  Constitutional: Negative for fever and unexpected weight change.  HENT: Negative for congestion, dental problem, ear pain, nosebleeds, postnasal drip, rhinorrhea, sinus pressure, sneezing, sore throat and trouble swallowing.   Eyes: Negative for redness and itching.  Respiratory: Positive for shortness of breath. Negative for cough, chest tightness and wheezing.   Cardiovascular: Negative for palpitations and leg swelling.  Gastrointestinal: Negative for nausea and vomiting.  Genitourinary: Negative for dysuria.  Musculoskeletal: Negative for joint swelling.  Skin: Negative for rash.  Neurological: Negative for headaches.  Hematological: Does not bruise/bleed easily.  Psychiatric/Behavioral: Negative for dysphoric mood. The patient is not nervous/anxious.        Objective:   Physical Exam  Filed Vitals:   06/30/14 1530  BP: 120/68  Pulse: 94  Height: 5\' 6"  (1.676 m)  Weight: 165 lb (74.844 kg)  SpO2: 94%        Assessment & Plan:     ICD-9-CM ICD-10-CM   1. Malignant neoplasm of lower lobe of left lung 162.5 C34.32 CT Chest Wo Contrast  2. Renal  insufficiency 593.9 N28.9 CT Abdomen Pelvis Wo Contrast  3. Malignant neoplasm of upper lobe of right lung 162.3 C34.11   4. COPD, very severe 496 J44.9   5. Renal cell carcinoma, unspecified laterality 189.0 C64.9      # lung cancer - glad you completed radiation in April 2015  - Left lower lobe lung cancer is improved - do non-contrast fllowup CT chest in Oct 2016  #renal cell cancer  - do surveillance CT abd/pelvis without contrast in Oct 2016  #COPD and chronic resp failure - stable - continue daily o2 - continue prednisone at 4mg  daily  - continue duoneb daily as needed - respect desiure not to take spiriva and advair   #Followup  return to see my NP [name] in Oct 2016 after CT chest and abdomen   Dr. Brand Males, M.D., [facility].C.P Pulmonary and Critical Care Medicine Staff Physician [facility] Pager: [phone], If no answer or between  15:00h - 7:00h: call [phone]  06/30/2014 5:05 PM

## 2014-07-03 ENCOUNTER — Ambulatory Visit (INDEPENDENT_AMBULATORY_CARE_PROVIDER_SITE_OTHER): Payer: Medicare Other | Admitting: Neurology

## 2014-07-03 ENCOUNTER — Encounter: Payer: Self-pay | Admitting: Neurology

## 2014-07-03 VITALS — BP 130/60 | HR 107 | Wt 167.1 lb

## 2014-07-03 DIAGNOSIS — G243 Spasmodic torticollis: Secondary | ICD-10-CM | POA: Diagnosis not present

## 2014-07-03 DIAGNOSIS — G250 Essential tremor: Secondary | ICD-10-CM

## 2014-07-03 NOTE — Progress Notes (Signed)
Subjective:   Melanie Best was seen in consultation in the movement disorder clinic at the request of Melanie Low, MD.  The evaluation is for tremor.  Pt is accompanied by her husband who supplements the history.  Pt reports that it has been going on for over 20 years.  It started in head but it went to the hands about a year ago.  It bothers her in the hands as she has trouble writing and dialing a phone.  The head doesn't bother her.  Her grandmother and brother both had tremor.  Pt was given primidone on 03/03/14 and took one pill and it made her dizzy and she didn't take it again.  She has not been on any other tremor medications.  Affected by caffeine:  unknown (drinks 2-3 cans pepsi per day) Affected by alcohol:  Doesn't drink Affected by stress:  No. Affected by fatigue:  No. Spills soup if on spoon:  Yes.   Spills glass of liquid if full:  No. Affects ADL's (tying shoes, brushing teeth, etc):  No.   07/03/14 update:  Pt is f/u today, accompanied by her husband who supplements the history.  Pt has hand tremor that is exacerbated by her meds for COPD.  The records that were made available to me were reviewed since last visit.  She remains on prednisone and duoneb.  I had her retry primidone last visit.  She states that it was helping some but not enough and on Tuesday her pcp increased her to bid dosing.  She isn't sure that it has helping more yet.  No SE.  No sleepiness with it.   She also has cervical dystonia but we both agreed to hold on treatment for that.  Current/Previously tried tremor medications: klonopin (takes 1 mg bid for anxiety)  Current medications that may exacerbate tremor:  Prednisone (4mg  currently), albuterol/atrovent (uses tid),  Outside reports reviewed: historical medical records, lab reports and referral letter/letters.  No Known Allergies  Outpatient Encounter Prescriptions as of 07/03/2014  Medication Sig  . aspirin 81 MG tablet Take 81 mg by mouth  daily.  . bimatoprost (LUMIGAN) 0.01 % SOLN Place 1 drop into both eyes at bedtime.   . brinzolamide (AZOPT) 1 % ophthalmic suspension Place 1 drop into both eyes 3 (three) times daily.  . Calcium Carb-Cholecalciferol (CALCIUM 500 +D) 500-400 MG-UNIT TABS Take 1 tablet by mouth 2 times daily at 12 noon and 4 pm.   . clonazePAM (KLONOPIN) 1 MG tablet Take 1 mg by mouth 2 (two) times daily as needed. For anxiety  . diltiazem (CARDIZEM CD) 180 MG 24 hr capsule Take 180 mg by mouth daily.  . ergocalciferol (VITAMIN D2) 50000 UNITS capsule Take 50,000 Units by mouth once a week. Take on Monday  . esomeprazole (NEXIUM) 40 MG capsule Take 40 mg by mouth daily before breakfast.   . furosemide (LASIX) 20 MG tablet Take 20 mg by mouth daily as needed. For fluid retention  . hydrOXYzine (ATARAX/VISTARIL) 25 MG tablet Take 25 mg by mouth every 8 (eight) hours as needed. For itching  . ipratropium-albuterol (DUONEB) 0.5-2.5 (3) MG/3ML SOLN Take 3 mLs by nebulization 4 (four) times daily as needed.  Marland Kitchen JANUVIA 50 MG tablet Take 50 mg by mouth daily.   Marland Kitchen lovastatin (MEVACOR) 20 MG tablet Take 20 mg by mouth at bedtime.  Donell Sievert IN Inhale into the lungs. 2 liters  . prednisoLONE acetate (PRED FORTE) 1 % ophthalmic suspension Apply 1 drop  to eye daily as needed. For acute iritis  . predniSONE (DELTASONE) 1 MG tablet   . PredniSONE 2 MG TBEC Take 4 mg by mouth daily.  . primidone (MYSOLINE) 50 MG tablet Take 50 mg by mouth at bedtime.  . traMADol (ULTRAM) 50 MG tablet Take 50 mg by mouth 3 (three) times daily as needed.    Past Medical History  Diagnosis Date  . Leukocytosis   . Glaucoma   . Urinary incontinence   . Dyslipidemia   . Osteopenia   . Anxiety   . Tremor   . Syncope   . Diabetes mellitus, type 2   . DVT (deep venous thrombosis), left     2011- treated /w coumadin   . Anemia   . Vitamin D deficiency   . Shortness of breath   . CKD (chronic kidney disease), stage II     removed R  kidney- 2010- for Cancer, followed by Dr. Diona Fanti  . Arthritis     R leg  . Hypertension     sees Dr. Deforest Hoyles for PCP, denies ever having stress or ECHO, ?when & where she had  last  EKG  . Esophageal reflux   . COPD (chronic obstructive pulmonary disease)     uses O2-2 liters , continuously , followed by Dr. Chase Caller  . Cancer     kidney  . Lung cancer     NSCLC  . DM (diabetes mellitus) 01/27/2012  . Hyperlipidemia 01/27/2012  . HTN (hypertension) 01/27/2012  . GERD (gastroesophageal reflux disease) 01/27/2012  . History of radiation therapy 01/09/12,01/11/12,01/15/12,01/17/12,& 01/19/12    rul lung,50Gy/32fx    Past Surgical History  Procedure Laterality Date  . Right nephrectomy    . Back surgery      for pinched nerve, 2011, here at [facility]  . Eye surgery      cataracts removed, ?IOL  . Flexible bronchoscopy  11/29/2011    History   Social History  . Marital Status: Married    Spouse Name: N/A  . Number of Children: N/A  . Years of Education: N/A   Occupational History  . Not on file.   Social History Main Topics  . Smoking status: Former Smoker -- 1.00 packs/day for 35 years    Types: Cigarettes    Quit date: 03/20/2004  . Smokeless tobacco: Never Used  . Alcohol Use: No  . Drug Use: No  . Sexual Activity: Not on file   Other Topics Concern  . Not on file   Social History Narrative    Family Status  Relation Status Death Age  . Mother Alive     HTN  . Father Deceased     emphysema  . Sister Alive     cancer   . Brother Deceased     throat cancer  . Brother Deceased     lung cancer  . Brother Deceased     lung cancer  . Brother Deceased   . Sister Deceased     emphysema  . Sister Alive     healthy  . Daughter Alive     breast cancer  . Daughter Alive     healthy  . Son Alive     healthy  . Son Alive     healthy    Review of Systems A complete 10 system ROS was obtained and was negative apart from what is mentioned.   Objective:    VITALS:   Filed Vitals:   07/03/14 1340  BP: 130/60  Pulse: 107  Weight: 167 lb 2 oz (75.807 kg)  SpO2: 90%   Gen:  Appears stated age and in NAD. HEENT:  Normocephalic, atraumatic. The mucous membranes are moist. The superficial temporal arteries are without ropiness or tenderness. Cardiovascular: Regular rate and rhythm. Lungs: Clear to auscultation bilaterally.  Wears O2.   Neck: There are no carotid bruits noted bilaterally.  musculoskeletal: Head is held in the flexed position.  There is hypertrophy of the left sternocleidomastoid muscle.  NEUROLOGICAL:  Orientation:  The patient is alert and oriented x 3.   Cranial nerves: There is good facial symmetry. The pupils are equal round and reactive to light bilaterally. Marland Kitchen Speech is fluent and clear. Soft palate rises symmetrically and there is no tongue deviation. Hearing is intact to conversational tone. Tone: Tone is good throughout. Sensation: Sensation is intact to light touch throughout. Coordination:  The patient has no dysdiadichokinesia or dysmetria. Motor: Strength is 5/5 in the bilateral upper and lower extremities.  Shoulder shrug is equal bilaterally.  There is no pronator drift.  There are no fasciculations noted. Gait and Station: Deferred today.  In her wheelchair with oxygen  MOVEMENT EXAM: Tremor:  There is fairly minor postural tremor today.  The patient is able to draw Archimedes spirals without significant difficulty.   Head tremor in the "no" direction.    Lab Results  Component Value Date   TSH 0.537 03/27/2014        Assessment/Plan:   1.  Cervical dystonia  -This is the cause of her head tremor.  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  Talked to her about treatments, including botox, but I really don't recommend in her and it doesn't bother her.  She doesn't want treatment for it 2.  Hand tremor  -May have some ET, but likely exacerbated if not due  to the multiple pulmonary medications used to treat her COPD.     - Her primary care physician just increased her primidone to 50 mg twice a day on Tuesday.  She really noticed no significant benefit by increasing it, but also had no side effects.  She does think that the medication is helpful.  I told her I had no objection to increasing the medication to 50 mg in the morning and 100 mg at night, if she would like. 3.  After some discussion, she decided that she was just going to follow up with her primary care physician for refills, since he was already manipulating her tremor medication anyway.  She is certainly welcome to follow-up here in the future on an as-needed basis.

## 2014-09-08 ENCOUNTER — Other Ambulatory Visit: Payer: Self-pay | Admitting: Internal Medicine

## 2014-09-08 ENCOUNTER — Ambulatory Visit
Admission: RE | Admit: 2014-09-08 | Discharge: 2014-09-08 | Disposition: A | Discharge status: Home / Self Care | Payer: Medicare Other | Source: Ambulatory Visit | Attending: Internal Medicine | Admitting: Internal Medicine

## 2014-09-08 DIAGNOSIS — R0781 Pleurodynia: Secondary | ICD-10-CM

## 2014-12-14 ENCOUNTER — Other Ambulatory Visit: Payer: Self-pay

## 2014-12-14 DIAGNOSIS — Z1231 Encounter for screening mammogram for malignant neoplasm of breast: Secondary | ICD-10-CM

## 2014-12-15 ENCOUNTER — Ambulatory Visit (INDEPENDENT_AMBULATORY_CARE_PROVIDER_SITE_OTHER)
Admission: RE | Admit: 2014-12-15 | Discharge: 2014-12-15 | Disposition: A | Discharge status: Home / Self Care | Payer: Medicare Other | Source: Ambulatory Visit | Attending: Internal Medicine | Admitting: Internal Medicine

## 2014-12-15 DIAGNOSIS — N289 Disorder of kidney and ureter, unspecified: Secondary | ICD-10-CM | POA: Diagnosis not present

## 2014-12-15 DIAGNOSIS — C3432 Malignant neoplasm of lower lobe, left bronchus or lung: Secondary | ICD-10-CM | POA: Diagnosis not present

## 2014-12-25 ENCOUNTER — Telehealth: Payer: Self-pay | Admitting: Internal Medicine

## 2014-12-25 NOTE — Telephone Encounter (Signed)
IMPRESSION: 1. Previously noted pulmonary nodules appear stable in size compared to the most recent prior examination from 06/13/2013. Continued attention on follow-up imaging is recommended to ensure continued stability. 2. New peripheral area of ground-glass attenuation and the left lower lobe (image 35 of series 3) measuring approximately 3.6 x 1.8 cm. This is favored to be infectious or inflammatory, but attention on future follow-up imaging is recommended to ensure the resolution of this finding. If the patient has any acute symptoms, the possibility of pulmonary embolism should be considered, and further evaluation with PE protocol might be warranted if clinically appropriate. 3. Small amount of anterior pericardial fluid and/or thickening, slightly increased compared to prior studies, but unlikely to be of any hemodynamic significance at this time. No associated pericardial calcification. 4. Status post right radical nephrectomy. No soft tissue in the resection bed to suggest local recurrence of disease. Multiple small left renal lesions, similar to prior studies, and although incompletely characterized on today's noncontrast CT examination these are likely to represent a combination of simple cysts and proteinaceous/hemorrhagic cysts. 5. Atherosclerosis, including left main and left anterior descending coronary artery disease. Please note that although the presence of coronary artery calcium documents the presence of coronary artery disease, the severity of this disease and any potential stenosis cannot be assessed on this non-gated CT examination. Assessment for potential risk factor modification, dietary therapy or pharmacologic therapy may be warranted, if clinically indicated. 6. Additional incidental findings, as above.   Electronically Signed By: Vinnie Langton M.D. On: 12/15/2014 13:39           Will discuss results at fu

## 2015-01-13 ENCOUNTER — Ambulatory Visit (INDEPENDENT_AMBULATORY_CARE_PROVIDER_SITE_OTHER): Payer: Medicare Other | Admitting: Internal Medicine

## 2015-01-13 ENCOUNTER — Encounter: Payer: Self-pay | Admitting: Internal Medicine

## 2015-01-13 VITALS — BP 108/62 | HR 101 | Ht 66.0 in | Wt 156.4 lb

## 2015-01-13 DIAGNOSIS — C649 Malignant neoplasm of unspecified kidney, except renal pelvis: Secondary | ICD-10-CM

## 2015-01-13 DIAGNOSIS — J449 Chronic obstructive pulmonary disease, unspecified: Secondary | ICD-10-CM | POA: Diagnosis not present

## 2015-01-13 DIAGNOSIS — R911 Solitary pulmonary nodule: Secondary | ICD-10-CM | POA: Diagnosis not present

## 2015-01-13 DIAGNOSIS — C3492 Malignant neoplasm of unspecified part of left bronchus or lung: Secondary | ICD-10-CM | POA: Insufficient documentation

## 2015-01-13 MED ORDER — IPRATROPIUM-ALBUTEROL 0.5-2.5 (3) MG/3ML IN SOLN: 3.0000 mL | Freq: Four times a day (QID) | RESPIRATORY_TRACT | Status: DC | PRN

## 2015-01-13 MED ORDER — IPRATROPIUM-ALBUTEROL 0.5-2.5 (3) MG/3ML IN SOLN: 3.0000 mL | Freq: Four times a day (QID) | RESPIRATORY_TRACT | Status: AC | PRN

## 2015-01-13 NOTE — Addendum Note (Signed)
Addended by: Beckie Busing on: 01/13/2015 04:55 PM   Modules accepted: Orders

## 2015-01-13 NOTE — Progress Notes (Signed)
Subjective:     Patient ID: Melanie Best, female   DOB: 29-Nov-1945, 69 y.o.   MRN: [id number]  HPI  41, 69 y.o.   MRN: [id number]  HPI  #Rt renal cell cancer Aug 2010 - Dr Diona Fanti Large right renal cell carcinoma with extension into the right renal vein and inferior vena cava  #CRI  - creat 1.'2mg'$ % Oct 2014  #COPD  chronic respiratory failure - O2 dependent since 2004 (CT 2010 - emphysema, CXR - per hx clear in 2013)  -- Gold stage 3-4, fev1 0.8L/34%, Ratio 40, TLC 110%, DLCO 35% In May 2013  - Normal ECHO - May 2006 - Rx Mdds include advair, spiriva, daily prednisone '10mg'$  and duoneb  -> reduced to '4mg'$  pred 09/09/2013 - REhab since oct 2012  - Fall 2013: Not a transplant candidate - [facility] transplant team screening  #Recurent AECOPD  - multiple in early 2013 - rx by PMD  - 03/01/2012 office visit - levaquin alone, no pred - Start N. acetylcysteine April 2014   #Ex-smoker  - 35 ppd. Quit 2007   #Baseline head tremor and walks with walkerr.   # Oct/Nov 2010  had s/p right nephrectomy for renal cell cancer discovered following MRI for leg/back pain.   #Also, s/p laminectomy following that in 2011 for "pinched nerve" by Dr Rodell Perna.  # Presumed stage 1A NSCLC - RUL nodule  -   In July 2013, CT showed 2.5cm spiculared RUL nodule. d PET scan 10/18/11 where Spiculated hypermetabolic right upper lobe lung nodule. This  measures 2.5 x 1.5 cm and a S.U.V. max of 6.1 on image 77.  - ENB 11/29/11: NON Diagnostic - normal resp cells - s.p empriic curative XRT ending Nov 2013 - Dec 2013: RUL mass improved 1.6cm - March 2014 : RUL mass improved 1.4cm - March 2016 - RUL nodule - down to 1.2cm  #left lung nodules  - July 2013: LUL/LLL 1.5cm and LL 6-134m - March 2014: :LUL/LLL 1.5cm and no change LLL 841mand slightly worse - Feb 2015 - LUL. LLL 1.5cm and no change. LLL 34m30mnd slightly worse -> XRT ending April 2015 - June 2015:  LUL/LLL - ? Same, LLL 7mm82md better - March 2016:  stable  ///////////////////////////////////////////////////////////////////////////////////////  OV 01/13/2015  Chief Complaint  Patient presents with  . Follow-up    Pt c/o of dyspnea with exertion. Pt is on O2/2L pulse. Pt denies cough/wheeze/CP/tightness.     Last visit 06/30/2014.  FU COPD with chronic respiratory failure- stable. Denies admission, ER visits, pred bursts,. DuoNeb with daily prednisone tablet. Since last visit she is doing DuoNeb. She gets this at right 8. Cause of $55 for 3 months. She is wondering if she could get something cheaper. She is asking for samples. She's not interested in Spiriva or Advair. She spends most of the day watching TV and playing with a computer husband agrees with her that she has no new issues   Fu lung cancer- s/p xrt. In April 2015. Surveillance CT scan March 2016 shows stability.. Repeat surveillance CT chest October 2016 that I personally visualized shows stability but there is a new left lower lobe lung nodule that is of large size but suspects more inflammatory and repeat nature   Renal cancer.: [facility]. But she tells me that they've asked me to do the surveillance CT of the abdomen at the time she has a surveillance CT scan of the  chest.  Her urologist is Dr. Preston Fleeting. This CT in October 2016 did not show any evidence of recurrence    Immunization History  Administered Date(s) Administered  . Influenza Split 11/28/2011, 12/18/2012, 12/22/2013  . Influenza Whole 11/19/2010  . Influenza-Unspecified 12/14/2014  . Pneumococcal Conjugate-13 05/14/2013  . Pneumococcal Polysaccharide-23 02/18/2004  . Td 02/24/2014    No Known Allergies   Review of Systems  As per history of present illness     Objective:   Physical Exam  Constitutional: She is oriented to person, place, and time. She appears well-developed and well-nourished. No distress.  Sitting in wheel chair Baseline  head nod Deconditioned at baseline Purse lip breathing Oxygen on  HENT:  Head: Normocephalic and atraumatic.  Right Ear: External ear normal.  Left Ear: External ear normal.  Mouth/Throat: Oropharynx is clear and moist. No oropharyngeal exudate.  Eyes: Conjunctivae and EOM are normal. Pupils are equal, round, and reactive to light. Right eye exhibits no discharge. Left eye exhibits no discharge. No scleral icterus.  Neck: Normal range of motion. Neck supple. No JVD present. No tracheal deviation present. No thyromegaly present.  Cardiovascular: Normal rate, regular rhythm, normal heart sounds and intact distal pulses.  Exam reveals no gallop and no friction rub.   No murmur heard. Pulmonary/Chest: Effort normal and breath sounds normal. No respiratory distress. She has no wheezes. She has no rales. She exhibits no tenderness.  Abdominal: Soft. Bowel sounds are normal. She exhibits no distension and no mass. There is no tenderness. There is no rebound and no guarding.  Musculoskeletal: Normal range of motion. She exhibits no edema or tenderness.  Lymphadenopathy:    She has no cervical adenopathy.  Neurological: She is alert and oriented to person, place, and time. She has normal reflexes. No cranial nerve deficit. She exhibits normal muscle tone. Coordination normal.  Skin: Skin is warm and dry. No rash noted. She is not diaphoretic. No erythema. No pallor.  Psychiatric: She has a normal mood and affect. Her behavior is normal. Judgment and thought content normal.  Vitals reviewed.   Filed Vitals:   01/13/15 1550  BP: 108/62  Pulse: 101  Height: '5\' 6"'$  (1.676 m)  Weight: 156 lb 6.4 oz (70.943 kg)  SpO2: 93%        Assessment:       ICD-9-CM ICD-10-CM   1. COPD, very severe ([place]) 496 J44.9   2. Lung nodule, left upper lobe and left lower lobe 793.11 R91.1   3. Renal cell carcinoma, unspecified laterality (HCC) 189.0 C64.9   4. Malignant neoplasm of left lung, unspecified part  of lung (HCC) 162.9 C34.92        Plan:      #COPD and chronic respiratory failure - Lung disease is stable - Continue daily prednisone as before - Continue daily oxygen as before - Continue duo neb but meet with my care coordinator to see if changing from right aid [employer] through Medicare part B can be cheaper - Glad you are up-to-date with flu shot  #Lung nodules and lung cancer - No evidence of recurrence on CT chest October 2016 but that is a new left lower lobe nodule  - Do repeat CT chest without contrast in 6 months  #Renal cancer  - No evidence of recurrence on CT October 2016 - Any questions follow-up with urology  #Follow-up - 6 months to see me or sooner if needed   Dr. Brand Males, M.D., [facility].C.P Pulmonary and Critical Care  Medicine Staff [place] [facility] Pager: [phone], If no answer or between  15:00h - 7:00h: call [phone]  01/13/2015 4:15 PM

## 2015-01-13 NOTE — Addendum Note (Signed)
Addended by: Beckie Busing on: 01/13/2015 04:26 PM   Modules accepted: Orders

## 2015-01-13 NOTE — Addendum Note (Signed)
Addended by: Beckie Busing on: 01/13/2015 04:22 PM   Modules accepted: Orders

## 2015-01-13 NOTE — Patient Instructions (Signed)
ICD-9-CM ICD-10-CM   1. COPD, very severe ([place]) 496 J44.9   2. Lung nodule, left upper lobe and left lower lobe 793.11 R91.1   3. Renal cell carcinoma, unspecified laterality (HCC) 189.0 C64.9   4. Malignant neoplasm of left lung, unspecified part of lung (HCC) 162.9 C34.92     #COPD and chronic respiratory failure - Lung disease is stable - Continue daily prednisone as before - Continue daily oxygen as before - Continue duo neb but meet with my care coordinator to see if changing from right aid [employer] through Medicare part B can be cheaper - Glad you are up-to-date with flu shot  #Lung nodules and lung cancer - No evidence of recurrence on CT chest October 2016 but that is a new left lower lobe nodule  - Do repeat CT chest without contrast in 6 months  #Renal cancer  - No evidence of recurrence on CT October 2016 - Any questions follow-up with urology  #Follow-up - 6 months to see me or sooner if needed

## 2015-01-13 NOTE — Addendum Note (Signed)
Addended by: Parke Poisson E on: 01/13/2015 05:25 PM   Modules accepted: Orders

## 2015-01-15 ENCOUNTER — Ambulatory Visit
Admission: RE | Admit: 2015-01-15 | Discharge: 2015-01-15 | Disposition: A | Discharge status: Home / Self Care | Payer: Medicare Other | Source: Ambulatory Visit

## 2015-01-15 DIAGNOSIS — Z1231 Encounter for screening mammogram for malignant neoplasm of breast: Secondary | ICD-10-CM

## 2015-03-08 ENCOUNTER — Other Ambulatory Visit (HOSPITAL_COMMUNITY)
Admission: RE | Admit: 2015-03-08 | Discharge: 2015-03-08 | Disposition: A | Discharge status: Home / Self Care | Payer: Medicare Other | Source: Ambulatory Visit | Attending: Obstetrics and Gynecology | Admitting: Obstetrics and Gynecology

## 2015-03-08 ENCOUNTER — Other Ambulatory Visit: Payer: Self-pay | Admitting: Obstetrics and Gynecology

## 2015-03-08 DIAGNOSIS — Z124 Encounter for screening for malignant neoplasm of cervix: Secondary | ICD-10-CM | POA: Diagnosis present

## 2015-03-09 LAB — CYTOLOGY - PAP

## 2015-05-28 ENCOUNTER — Telehealth: Payer: Self-pay | Admitting: Internal Medicine

## 2015-05-28 NOTE — Telephone Encounter (Signed)
lmtcb x1 not seeing any messages on pt

## 2015-05-31 NOTE — Telephone Encounter (Signed)
Called spoke with pt and made aware not seeing where anyone tried calling pt. Nothing further needed

## 2015-06-22 ENCOUNTER — Ambulatory Visit (INDEPENDENT_AMBULATORY_CARE_PROVIDER_SITE_OTHER)
Admission: RE | Admit: 2015-06-22 | Discharge: 2015-06-22 | Disposition: A | Discharge status: Home / Self Care | Payer: Medicare Other | Source: Ambulatory Visit | Attending: Internal Medicine | Admitting: Internal Medicine

## 2015-06-22 DIAGNOSIS — C3492 Malignant neoplasm of unspecified part of left bronchus or lung: Secondary | ICD-10-CM | POA: Diagnosis not present

## 2015-06-24 NOTE — Progress Notes (Signed)
Quick Note:  Pt returned call. Informed her of the results and recs per MR. Pt verbalized understanding and denied any further questions or concerns at this time. ______ 

## 2015-06-24 NOTE — Progress Notes (Signed)
Quick Note:  lmtcb for pt. ______ 

## 2015-07-09 ENCOUNTER — Encounter: Payer: Self-pay | Admitting: Internal Medicine

## 2015-07-09 ENCOUNTER — Ambulatory Visit (INDEPENDENT_AMBULATORY_CARE_PROVIDER_SITE_OTHER): Payer: Medicare Other | Admitting: Internal Medicine

## 2015-07-09 VITALS — BP 114/62 | HR 97 | Ht 66.0 in | Wt 152.4 lb

## 2015-07-09 DIAGNOSIS — C3492 Malignant neoplasm of unspecified part of left bronchus or lung: Secondary | ICD-10-CM | POA: Diagnosis not present

## 2015-07-09 DIAGNOSIS — R911 Solitary pulmonary nodule: Secondary | ICD-10-CM | POA: Diagnosis not present

## 2015-07-09 DIAGNOSIS — C649 Malignant neoplasm of unspecified kidney, except renal pelvis: Secondary | ICD-10-CM | POA: Diagnosis not present

## 2015-07-09 DIAGNOSIS — J449 Chronic obstructive pulmonary disease, unspecified: Secondary | ICD-10-CM

## 2015-07-09 NOTE — Addendum Note (Signed)
Addended by: Collier Salina on: 07/09/2015 03:21 PM   Modules accepted: Orders

## 2015-07-09 NOTE — Patient Instructions (Addendum)
ICD-9-CM ICD-10-CM   1. COPD, very severe ([place]) 496 J44.9   2. Lung nodule, left upper lobe and left lower lobe 793.11 R91.1   3. Renal cell carcinoma, unspecified laterality (HCC) 189.0 C64.9   4. Malignant neoplasm of left lung, unspecified part of lung (HCC) 162.9 C34.92     #COPD and chronic respiratory failure - Lung disease is stable - Continue daily prednisone as before - Continue daily oxygen as before - Continue duo neb  #Lung nodules and lung cancer - No evidence of recurrence on CT chest October 2016 but that is a new left lower lobe nodule - this is stable April 2017 CT  - Do repeat CT chest without contrast in dec 2017  #Renal cancer  - No evidence of recurrence on CT October 2016 -do repeat ct abdomen wo cntrast Dec 2017  #Follow-up -dec 2017 after scans or sooner if needed

## 2015-07-09 NOTE — Addendum Note (Signed)
Addended by: Collier Salina on: 07/09/2015 03:24 PM   Modules accepted: Orders

## 2015-07-09 NOTE — Progress Notes (Signed)
9, 70 y.o.   MRN: [id number]  HPI  #Rt renal cell cancer Aug 2010 - Dr Diona Fanti Large right renal cell carcinoma with extension into the right renal vein and inferior vena cava  #CRI  - creat 1.'2mg'$ % Oct 2014  #COPD  chronic respiratory failure - O2 dependent since 2004 (CT 2010 - emphysema, CXR - per hx clear in 2013)  -- Gold stage 3-4, fev1 0.8L/34%, Ratio 40, TLC 110%, DLCO 35% In May 2013  - Normal ECHO - May 2006 - Rx Mdds include advair, spiriva, daily prednisone '10mg'$  and duoneb  -> reduced to '4mg'$  pred 09/09/2013 - REhab since oct 2012  - Fall 2013: Not a transplant candidate - [facility] transplant [facility] screening  #Recurent AECOPD  - multiple in early 2013 - rx by PMD  - 03/01/2012 office visit - levaquin alone, no pred - Start N. acetylcysteine April 2014   #Ex-smoker  - 35 ppd. Quit 2007   #Baseline head tremor and walks with walkerr.   # Oct/Nov 2010  had s/p right nephrectomy for renal cell cancer discovered following MRI for leg/back pain.   #Also, s/p laminectomy following that in 2011 for "pinched nerve" by Dr Rodell Perna.  # Presumed stage 1A NSCLC - RUL nodule  -   In July 2013, CT showed 2.5cm spiculared RUL nodule. d PET scan 10/18/11 where Spiculated hypermetabolic right upper lobe lung nodule. This  measures 2.5 x 1.5 cm and a S.U.V. max of 6.1 on image 77.  - ENB 11/29/11: NON Diagnostic - normal resp cells - s.p empriic curative XRT ending Nov 2013 - Dec 2013: RUL mass improved 1.6cm - March 2014 : RUL mass improved 1.4cm - March 2016 - RUL nodule - down to 1.2cm  #left lung nodules  - July 2013: LUL/LLL 1.5cm and LL 6-76m - March 2014: :LUL/LLL 1.5cm and no change LLL 826mand slightly worse - Feb 2015 - LUL. LLL 1.5cm and no change. LLL 78m578mnd slightly worse -> XRT ending April 2015 - June 2015:  LUL/LLL - ? Same, LLL 7mm23md better - March 2016: stable  ///////////////////////////////////////////////////////////////////////////////////////  OV  01/13/2015  Chief Complaint  Patient presents with  . Follow-up    Pt c/o of dyspnea with exertion. Pt is on O2/2L pulse. Pt denies cough/wheeze/CP/tightness.     Last visit 06/30/2014.  FU COPD with chronic respiratory failure- stable. Denies admission, ER visits, pred bursts,. DuoNeb with daily prednisone tablet. Since last visit she is doing DuoNeb. She gets this at right 8. Cause of $55 for 3 months. She is wondering if she could get something cheaper. She is asking for samples. She's not interested in Spiriva or Advair. She spends most of the day watching TV and playing with a computer husband agrees with her that she has no new issues   Fu lung cancer- s/p xrt. In April 2015. Surveillance CT scan March 2016 shows stability.. Repeat surveillance CT chest October 2016 that I personally visualized shows stability but there is a new left lower lobe lung nodule that is of large size but suspects more inflammatory and repeat nature   Renal cancer.: [facility]. But she tells me that they've asked me to do the surveillance CT of the abdomen at the time she has a surveillance CT scan of the chest.  Her urologist is Dr. StevPreston Fleetingis CT in October 2016 did not show any evidence of recurrence   OV 07/09/2015  Chief Complaint  Patient presents with  . Follow-up    SOB worse with exertion about the same as last visit. Denies any wheezing, cp/tightness.    Follow-up COPD, lung cancer with lung nodules and also renal cancer surveillance, in the setting of chronic respiratory failure.  Resins with her husband. Last follow-up was in fall 2016. Since then she's been doing well. She has intentionally lost a lot of weight. She feels more energetic. She had CT scan in April 2017 nodules are stable. This of the chest. She is upcoming for a surveillance CT chest and renal cancer  CT of the abdomen without contrast end of 2017. Urology has been during the  surveillance scans for her kidney. There are no new issues. sHe is compliant with nebulizers.     has a past medical history of Leukocytosis; Glaucoma; Urinary incontinence; Dyslipidemia; Osteopenia; Anxiety; Tremor; Syncope; Diabetes mellitus, type 2 ([place]); DVT (deep venous thrombosis), left; Anemia; Vitamin D deficiency; Shortness of breath; CKD (chronic kidney disease), stage II; Arthritis; Hypertension; Esophageal reflux; COPD (chronic obstructive pulmonary disease) ([place]); Cancer ([place]); Lung cancer ([place]); DM (diabetes mellitus) ([place]) (01/27/2012); Hyperlipidemia (01/27/2012); HTN (hypertension) (01/27/2012); GERD (gastroesophageal reflux disease) (01/27/2012); and History of radiation therapy (01/09/12,01/11/12,01/15/12,01/17/12,& 01/19/12).   reports that she quit smoking about 11 years ago. Her smoking use included Cigarettes. She has a 35 pack-year smoking history. She has never used smokeless tobacco.  Past Surgical History  Procedure Laterality Date  . Right nephrectomy    . Back surgery      for pinched nerve, 2011, [facility] at [facility]  . Eye surgery      cataracts removed, ?IOL  . Flexible bronchoscopy  11/29/2011    No Known Allergies  Immunization History  Administered Date(s) Administered  . Influenza Split 11/28/2011, 12/18/2012, 12/22/2013  . Influenza Whole 11/19/2010  . Influenza-Unspecified 12/14/2014  . Pneumococcal Conjugate-13 05/14/2013  . Pneumococcal Polysaccharide-23 02/18/2004  . Td 02/24/2014    Family History  Problem Relation Age of Onset  . Emphysema Father   . Cancer Sister     behind eye  . Cancer Brother     lung  . Cancer Brother     lung  . Cancer Brother     throat     Current outpatient prescriptions:  .  aspirin 81 MG tablet, Take 81 mg by mouth daily., Disp: , Rfl:  .  bimatoprost (LUMIGAN) 0.01 % SOLN, Place 1 drop into both eyes at bedtime. , Disp: , Rfl:  .  brinzolamide (AZOPT) 1 % ophthalmic suspension, Place 1 drop into both eyes 3  (three) times daily., Disp: , Rfl:  .  Calcium Carb-Cholecalciferol (CALCIUM 500 +D) 500-400 MG-UNIT TABS, Take 1 tablet by mouth 2 times daily at 12 noon and 4 pm. , Disp: , Rfl:  .  clonazePAM (KLONOPIN) 1 MG tablet, Take 1 mg by mouth 2 (two) times daily as needed. For anxiety, Disp: , Rfl:  .  diltiazem (CARDIZEM CD) 180 MG 24 hr capsule, Take 180 mg by mouth daily., Disp: , Rfl: 0 .  ergocalciferol (VITAMIN D2) 50000 UNITS capsule, Take 50,000 Units by mouth once a week. Take on Monday, Disp: , Rfl:  .  esomeprazole (NEXIUM) 40 MG capsule, Take 40 mg by mouth daily before breakfast. , Disp: , Rfl:  .  furosemide (LASIX) 20 MG tablet, Take 20 mg by mouth daily as needed. For fluid retention, Disp: , Rfl:  .  hydrOXYzine (ATARAX/VISTARIL) 25 MG tablet, Take 25 mg  by mouth every 8 (eight) hours as needed. For itching, Disp: , Rfl:  .  ipratropium-albuterol (DUONEB) 0.5-2.5 (3) MG/3ML SOLN, Take 3 mLs by nebulization 4 (four) times daily as needed., Disp: 360 mL, Rfl: 4 .  JANUVIA 50 MG tablet, Take 50 mg by mouth daily. , Disp: , Rfl:  .  lovastatin (MEVACOR) 20 MG tablet, Take 20 mg by mouth at bedtime., Disp: , Rfl:  .  OXYGEN-HELIUM IN, Inhale into the lungs. 2 liters, Disp: , Rfl:  .  prednisoLONE acetate (PRED FORTE) 1 % ophthalmic suspension, Apply 1 drop to eye daily as needed. For acute iritis, Disp: , Rfl:  .  predniSONE (DELTASONE) 1 MG tablet, , Disp: , Rfl: 0 .  primidone (MYSOLINE) 50 MG tablet, Take 50 mg by mouth at bedtime., Disp: , Rfl:  .  traMADol (ULTRAM) 50 MG tablet, Take 50 mg by mouth 3 (three) times daily as needed., Disp: , Rfl:      OBJECTIVE  Filed Vitals:   07/09/15 1442  BP: 114/62  Pulse: 97  Height: '5\' 6"'$  (1.676 m)  Weight: 152 lb 6.4 oz (69.128 kg)  SpO2: 98%   General exam: Looks much better than before has lost a lot of weight HEENT: Frequent headshake like before present. Oxygen on Cardiovascular: Normal heart sounds regular rate and  rhythm. Respiratory exam: Clear to auscultation bilaterally. Overall air entry diminished consistent with COPD Abdomen: Soft nontender Musculoskeletal: Sitting in with a wheelchair. Extremities: No cyanosis no clubbing no edema Skin exam: Intact in the exposed areas  ASSESSMENT/PLAN   ICD-9-CM ICD-10-CM   1. COPD, very severe ([place]) 496 J44.9   2. Lung nodule, left upper lobe and left lower lobe 793.11 R91.1   3. Renal cell carcinoma, unspecified laterality (HCC) 189.0 C64.9   4. Malignant neoplasm of left lung, unspecified part of lung (HCC) 162.9 C34.92       #COPD and chronic respiratory failure - Lung disease is stable - Continue daily prednisone as before - Continue daily oxygen as before - Continue duo neb  #Lung nodules and lung cancer - No evidence of recurrence on CT chest October 2016 but that is a new left lower lobe nodule - this is stable April 2017 CT  - Do repeat CT chest without contrast in dec 2017  #Renal cancer  - No evidence of recurrence on CT October 2016 -do repeat ct abdomen wo cntrast Dec 2017  #Follow-up -dec 2017 after scans or sooner if needed    Dr. Brand Males, M.D., [facility].C.P Pulmonary and Critical Care Medicine Staff Physician [place] [facility] Pager: [phone], If no answer or between  15:00h - 7:00h: call [phone]  07/09/2015 3:06 PM

## 2015-08-02 ENCOUNTER — Other Ambulatory Visit: Payer: Self-pay | Admitting: Internal Medicine

## 2015-08-02 ENCOUNTER — Ambulatory Visit
Admission: RE | Admit: 2015-08-02 | Discharge: 2015-08-02 | Disposition: A | Discharge status: Home / Self Care | Payer: Medicare Other | Source: Ambulatory Visit | Attending: Internal Medicine | Admitting: Internal Medicine

## 2015-08-02 DIAGNOSIS — R042 Hemoptysis: Secondary | ICD-10-CM

## 2015-08-02 DIAGNOSIS — R059 Cough, unspecified: Secondary | ICD-10-CM

## 2015-08-02 HISTORY — DX: Hemoptysis: R04.2

## 2015-08-02 HISTORY — DX: Cough, unspecified: R05.9

## 2015-08-03 ENCOUNTER — Other Ambulatory Visit: Payer: Self-pay | Admitting: Internal Medicine

## 2015-08-03 DIAGNOSIS — C3490 Malignant neoplasm of unspecified part of unspecified bronchus or lung: Secondary | ICD-10-CM

## 2015-08-09 NOTE — Progress Notes (Signed)
Thoracic Location of Tumor / Histology:NSCLC of right upper lobe  Patient presented on 08-02-15 to [place] with report of coughing bloody mucous.  Had no chest pain or SOB.  CXR showed left lung density increased prominence.  She was given a Z pak to cover secondary infection as stated by Emi Belfast, M.D.  Biopsies of (if applicable) revealed: None since 11-29-11  Tobacco/Marijuana/Snuff/ETOH use: Smoked for 35 years and  Quit 03-20-2004, No drug or alcohol use  Past/Anticipated interventions by cardiothoracic surgery, if any: 08-02-15 CXR and Z pak  Past/Anticipated interventions by medical oncology, if any: None Signs/Symptoms Weight changes, if any:  Wt Readings from Last 3 Encounters:  08/12/15 147 lb (66.679 kg)  07/09/15 152 lb 6.4 oz (69.128 kg)  01/13/15 156 lb 6.4 oz (70.943 kg)    Respiratory complaints, if any: SOB at times,cough no,chest pain no, no swallowing problemsO2 sat 2 L/min continous             Hemoptysis, if any: None since 08-02-15   As of  08-02-15 presents with hemoptysis. Stable chest CT. Stable areas of radiation fibrosis in the right upper and left lower lobes. Bilateral pulmonary nodules are unchanged from 06/22/2015  Pain issues, if any:  No  Z pak completed 08-07-15  SAFETY ISSUES:  Prior radiation? ; Left lower lobe/60 GY in 5 fractions at 12 Gy per fraction completed 07-07-13  Pacemaker/ICD? : No  Possible current pregnancy?: No  Is the patient on methotrexate? : No  Current Complaints / other details:Here to discuss radiation treatment BP 109/59 mmHg  Pulse 94  Temp(Src) 98.5 F (36.9 C) (Oral)  Resp 18  Ht '5\' 6"'$  (1.676 m)  Wt 147 lb (66.679 kg)  BMI 23.74 kg/m2  SpO2 100%

## 2015-08-10 ENCOUNTER — Ambulatory Visit
Admission: RE | Admit: 2015-08-10 | Discharge: 2015-08-10 | Disposition: A | Discharge status: Home / Self Care | Payer: Medicare Other | Source: Ambulatory Visit | Attending: Internal Medicine | Admitting: Internal Medicine

## 2015-08-10 DIAGNOSIS — C3490 Malignant neoplasm of unspecified part of unspecified bronchus or lung: Secondary | ICD-10-CM

## 2015-08-11 ENCOUNTER — Encounter: Payer: Self-pay | Admitting: Radiation Oncology

## 2015-08-12 ENCOUNTER — Ambulatory Visit
Admission: RE | Admit: 2015-08-12 | Discharge: 2015-08-12 | Disposition: A | Discharge status: Home / Self Care | Payer: Medicare Other | Source: Ambulatory Visit | Attending: Radiation Oncology | Admitting: Radiation Oncology

## 2015-08-12 ENCOUNTER — Encounter: Payer: Self-pay | Admitting: Radiation Oncology

## 2015-08-12 VITALS — BP 109/59 | HR 94 | Temp 98.5°F | Resp 18 | Ht 66.0 in | Wt 147.0 lb

## 2015-08-12 DIAGNOSIS — C3432 Malignant neoplasm of lower lobe, left bronchus or lung: Secondary | ICD-10-CM

## 2015-08-12 DIAGNOSIS — Z923 Personal history of irradiation: Secondary | ICD-10-CM | POA: Diagnosis present

## 2015-08-12 DIAGNOSIS — C3490 Malignant neoplasm of unspecified part of unspecified bronchus or lung: Secondary | ICD-10-CM | POA: Diagnosis present

## 2015-08-12 DIAGNOSIS — R918 Other nonspecific abnormal finding of lung field: Secondary | ICD-10-CM | POA: Diagnosis not present

## 2015-08-12 HISTORY — DX: Cough: R05

## 2015-08-12 HISTORY — DX: Hemoptysis: R04.2

## 2015-08-12 NOTE — Progress Notes (Signed)
  Radiation Oncology         [phone] ________________________________  [name] - Date: 08/12/2015   Name: Melanie Best MRN: [id number]   DOB: 06/21/1945  REFERRING PHYSICIAN: Brand Males, MD  DIAGNOSIS AND STAGE: NSCLC   Interval history:Melanie Best is a 70 y.o. female who I initially treated with radiation in April of 2015 for NSCLC of the left lower lobe. She also has renal cell cancer diagnosed in 2010 which was treated with nephrectomy. She presented to [facility] on 08/02/2015 with report of hemoptysis. She had a chest x-ray performed which showed increased density in the left lung base. She was placed on Z-pak, which she completed on 08/07/2015, to address secondary infection.   Her PCP contacted me about her and a CT was suggested. That was performed on Tuesday and showed stable areas of radiation fibrosis in the left lower lobe as well as stable bilateral pulmonary nodules. She has no worsening respiratory complaints. She saw Dr. Chase Caller in April and will have a follow up with him in December.   Today, she reports SOB at times. She denies any cough, chest pain, or swallowing problems. She denies any episodes of  bruising easily. She denies any hematuria or hematochezia. She denies having any nose bleeds or dryness in her mouth or nose. She states that she has not had any hemoptysis since 08/02/2015.   PREVIOUS RADIATION THERAPY: Yes; 06/24/2013, 06/30/2013, 07/02/2013, 07/04/2013, 07/07/2013 Site/dose: Left lower Lobe/ 60 Gy in 5 fractions at 12 Gy per fraction  Past medical, social and family history were reviewed in the electronic chart. Review of symptoms was reviewed in the electronic chart. Medications were reviewed in the electronic chart.   PHYSICAL EXAM:  Filed Vitals:   08/12/15 1045  BP: 109/59  Pulse: 94  Temp: 98.5 F (36.9 C)  Resp: 18  .147 lb (66.679 kg).   This is a female in no acute distress. She is alert and oriented in a wheelchair with  a nasal cannula in place.  IMPRESSION: NSCLC of the left lower lobe s/p radiation, currently NED.   PLAN: Continue follow up with Dr. Chase Caller and see him in 6 months as scheduled.   I spent 15 minutes  face to face with the patient and more than 50% of that time was spent in counseling and/or coordination of care.   ------------------------------------------------  Thea Silversmith, MD  This document serves as a record of services personally performed by Thea Silversmith, MD. It was created on her behalf by Jenell Milliner, a trained medical scribe. The creation of this record is based on the scribe's personal observations and the provider's statements to them. This document has been checked and approved by the attending provider.

## 2015-08-12 NOTE — Addendum Note (Signed)
Encounter addended by: Malena Edman, RN on: 08/12/2015  1:29 PM<BR>     Documentation filed: Charges VN

## 2015-08-19 ENCOUNTER — Institutional Professional Consult (permissible substitution): Payer: Medicare Other | Admitting: Radiation Oncology

## 2015-12-07 ENCOUNTER — Telehealth: Payer: Self-pay | Admitting: Internal Medicine

## 2015-12-07 NOTE — Telephone Encounter (Signed)
Spoke with the pt  She is returning a call, but unsure who called her b/c no name was left  I see that she will need CT in Dec, so will forward to [facility] to see if they called? Please advise thanks

## 2015-12-08 NOTE — Telephone Encounter (Signed)
None of the pcc's called her Joellen [place]

## 2015-12-08 NOTE — Telephone Encounter (Signed)
Spoke with pt, states she received another call and the message was stating that she needed to reschedule her 10/24 office visit.  This has been rescheduled.  Nothing further needed.

## 2015-12-17 ENCOUNTER — Other Ambulatory Visit: Payer: Self-pay | Admitting: Internal Medicine

## 2015-12-17 DIAGNOSIS — Z1231 Encounter for screening mammogram for malignant neoplasm of breast: Secondary | ICD-10-CM

## 2016-01-11 ENCOUNTER — Ambulatory Visit: Payer: Medicare Other | Admitting: Internal Medicine

## 2016-01-17 ENCOUNTER — Ambulatory Visit
Admission: RE | Admit: 2016-01-17 | Discharge: 2016-01-17 | Disposition: A | Discharge status: Home / Self Care | Payer: Medicare Other | Source: Ambulatory Visit | Attending: Internal Medicine | Admitting: Internal Medicine

## 2016-01-17 DIAGNOSIS — Z1231 Encounter for screening mammogram for malignant neoplasm of breast: Secondary | ICD-10-CM

## 2016-01-18 ENCOUNTER — Ambulatory Visit (INDEPENDENT_AMBULATORY_CARE_PROVIDER_SITE_OTHER): Payer: Medicare Other | Admitting: Internal Medicine

## 2016-01-18 ENCOUNTER — Encounter: Payer: Self-pay | Admitting: Internal Medicine

## 2016-01-18 VITALS — BP 104/60 | HR 90 | Ht 66.0 in | Wt 150.6 lb

## 2016-01-18 DIAGNOSIS — J449 Chronic obstructive pulmonary disease, unspecified: Secondary | ICD-10-CM

## 2016-01-18 DIAGNOSIS — R911 Solitary pulmonary nodule: Secondary | ICD-10-CM | POA: Diagnosis not present

## 2016-01-18 DIAGNOSIS — C3492 Malignant neoplasm of unspecified part of left bronchus or lung: Secondary | ICD-10-CM | POA: Diagnosis not present

## 2016-01-18 DIAGNOSIS — C649 Malignant neoplasm of unspecified kidney, except renal pelvis: Secondary | ICD-10-CM | POA: Diagnosis not present

## 2016-01-18 NOTE — Progress Notes (Signed)
Subjective:     Patient ID: Melanie Best, female   DOB: August 20, 1945, 70 y.o.   MRN: [id number]  PCP [name], MD  HPI   #Rt renal cell cancer Aug 2010 - Dr Diona Fanti Large right renal cell carcinoma with extension into the right renal vein and inferior vena cava  #CRI  - creat 1.106m% Oct 2014  #COPD  chronic respiratory failure - O2 dependent since 2004 (CT 2010 - emphysema, CXR - per hx clear in 2013)  -- Gold stage 3-4, fev1 0.8L/34%, Ratio 40, TLC 110%, DLCO 35% In May 2013  - Normal ECHO - May 2006 - Rx Mdds include advair, spiriva, daily prednisone 170mand duoneb  -> reduced to 62m29mred 09/09/2013 - REhab since oct 2012  - Fall 2013: Not a transplant candidate - [facility] transplant team screening  #Recurent AECOPD  - multiple in early 2013 - rx by PMD  - 03/01/2012 office visit - levaquin alone, no pred - Start N. acetylcysteine April 2014   #Ex-smoker  - 35 ppd. Quit 2007   #Baseline head tremor and walks with walkerr.   # Oct/Nov 2010  had s/p right nephrectomy for renal cell cancer discovered following MRI for leg/back pain.   #Also, s/p laminectomy following that in 2011 for "pinched nerve" by Dr MarRodell Perna# Presumed stage 1A NSCLC - RUL nodule  -   In July 2013, CT showed 2.5cm spiculared RUL nodule. d PET scan 10/18/11 where Spiculated hypermetabolic right upper lobe lung nodule. This  measures 2.5 x 1.5 cm and a S.U.V. max of 6.1 on image 77.  - ENB 11/29/11: NON Diagnostic - normal resp cells - s.p empriic curative XRT ending Nov 2013 - Dec 2013: RUL mass improved 1.6cm - March 2014 : RUL mass improved 1.4cm - March 2016 - RUL nodule - down to 1.2cm  #left lung nodules  - July 2013: LUL/LLL 1.5cm and LL 6-7mm60mMarch 2014: :LUL/LLL 1.5cm and no change LLL 8mm 67m slightly worse - Feb 2015 - LUL. LLL 1.5cm and no change. LLL 9mm a72mslightly worse -> XRT ending April 2015 - June 2015:  LUL/LLL - ? Same, LLL 7mm an88metter - March 2016:  stable  ///////////////////////////////////////////////////////////////////////////////////////  OV 01/13/2015  Chief Complaint  Patient presents with  . Follow-up    Pt c/o of dyspnea with exertion. Pt is on O2/2L pulse. Pt denies cough/wheeze/CP/tightness.     Last visit 06/30/2014.  FU COPD with chronic respiratory failure- stable. Denies admission, ER visits, pred bursts,. DuoNeb with daily prednisone tablet. Since last visit she is doing DuoNeb. She gets this at [facility] Cause of $55 for 3 months. She is wondering if she could get something cheaper. She is asking for samples. She's not interested in Spiriva or Advair. She spends most of the day watching TV and playing with a computer husband agrees with her that she has no new issues   Fu lung cancer- s/p xrt. In April 2015. Surveillance CT scan March 2016 shows stability.. Repeat surveillance CT chest October 2016 that I personally visualized shows stability but there is a new left lower lobe lung nodule that is of large size but suspects more inflammatory and repeat nature   Renal cancer.: [facility]. But she tells me that they've asked me to do the surveillance CT of the abdomen at the time she has a surveillance CT scan of the chest.  Her urologist is  Dr. Steven Dahlstedt. This CT in October 2016 did not show any evidence of recurrence   OV 07/09/2015  Chief Complaint  Patient presents with  . Follow-up    SOB worse with exertion about the same as last visit. Denies any wheezing, cp/tightness.    Follow-up COPD, lung cancer with lung nodules and also renal cancer surveillance, in the setting of chronic respiratory failure.   Last follow-up was in fall 2016. Since then she's been doing well. She has intentionally lost a lot of weight. She feels more energetic. She had CT scan in April 2017 nodules are stable. This of the chest. She is upcoming for a surveillance CT chest and renal  cancer  CT of the abdomen without contrast end of 2017. Urology has been during the surveillance scans for her kidney. There are no new issues. sHe is compliant with nebulizers.   OV 01/18/2016  Chief Complaint  Patient presents with  . Follow-up    Pt states breathing is good today. Pt c/o no congestion, tightness or SOB. Pt states no coughing. Pt breathing has not changed since last OV.       follow-up COPD, lung cancer with lung nodules and also renal cancer surveillance in the setting of chronic hypoxemic respiratory failure   last visit April 2017. She was supposed to have surveillance CT chest and CT abdomen in December 2017 and then return to see me. However this visit appointment was made prematurely. She is here for follow-up. She says that she is maintaining her weight loss and therefore she is more energetic. She has no new acute complaints. She continues to wear daily oxygen. She's had to have a CT chest which is supposed to be in December 2017. She is no acute complaints. She is up-to-date with her flu shot. Her husband also endorses no complaints on her behalf.   has a past medical history of Anemia; Anxiety; Arthritis; Cancer (HCC); CKD (chronic kidney disease), stage II; COPD (chronic obstructive pulmonary disease) (HCC); Cough (08-02-2015); Diabetes mellitus, type 2 (HCC); DM (diabetes mellitus) (HCC) (01/27/2012); DVT (deep venous thrombosis), left; Dyslipidemia; Esophageal reflux; GERD (gastroesophageal reflux disease) (01/27/2012); Glaucoma; Hemoptysis (08-02-15); History of radiation therapy (01/09/12,01/11/12,01/15/12,01/17/12,& 01/19/12); HTN (hypertension) (01/27/2012); Hyperlipidemia (01/27/2012); Hypertension; Leukocytosis; Lung cancer (HCC); Osteopenia; Shortness of breath; Syncope; Tremor; Urinary incontinence; and Vitamin D deficiency.   reports that she quit smoking about 11 years ago. Her smoking use included Cigarettes. She has a 35.00 pack-year smoking history. She has  never used smokeless tobacco.  Past Surgical History:  Procedure Laterality Date  . BACK SURGERY     for pinched nerve, 2011, here at [facility]  . EYE SURGERY     cataracts removed, ?IOL  . FLEXIBLE BRONCHOSCOPY  11/29/2011  . right nephrectomy      No Known Allergies  Immunization History  Administered Date(s) Administered  . Influenza Split 11/28/2011, 12/18/2012, 12/22/2013, 12/19/2015  . Influenza Whole 11/19/2010  . Influenza-Unspecified 12/14/2014  . Pneumococcal Conjugate-13 05/14/2013  . Pneumococcal Polysaccharide-23 02/18/2004  . Td 02/24/2014    Family History  Problem Relation Age of Onset  . Emphysema Father   . Cancer Sister     behind eye  . Cancer Brother     lung  . Cancer Brother     lung  . Cancer Brother     throat     Current Outpatient Prescriptions:  .  aspirin 81 MG tablet, Take 81 mg by mouth daily., Disp: , Rfl:  .  bimatoprost (  LUMIGAN) 0.01 % SOLN, Place 1 drop into both eyes at bedtime. , Disp: , Rfl:  .  Blood Glucose Monitoring Suppl (ONE TOUCH ULTRA SYSTEM KIT) w/Device KIT, 50 kits by Does not apply route once. Testing as directed, Disp: , Rfl:  .  Calcium Carb-Cholecalciferol (CALCIUM 500 +D) 500-400 MG-UNIT TABS, Take 1 tablet by mouth 2 times daily at 12 noon and 4 pm. , Disp: , Rfl:  .  diltiazem (CARDIZEM CD) 180 MG 24 hr capsule, Take 180 mg by mouth daily., Disp: , Rfl: 0 .  ergocalciferol (VITAMIN D2) 50000 UNITS capsule, Take 50,000 Units by mouth once a week. Take on Monday, Disp: , Rfl:  .  esomeprazole (NEXIUM) 40 MG capsule, Take 40 mg by mouth daily before breakfast. , Disp: , Rfl:  .  furosemide (LASIX) 20 MG tablet, Take 20 mg by mouth daily as needed. For fluid retention, Disp: , Rfl:  .  hydrOXYzine (ATARAX/VISTARIL) 25 MG tablet, Take 25 mg by mouth every 8 (eight) hours as needed. Reported on 08/12/2015, Disp: , Rfl:  .  ipratropium-albuterol (DUONEB) 0.5-2.5 (3) MG/3ML SOLN, Take 3 mLs by nebulization 4 (four) times daily  as needed., Disp: 360 mL, Rfl: 4 .  JANUVIA 50 MG tablet, Take 50 mg by mouth daily. , Disp: , Rfl:  .  lovastatin (MEVACOR) 20 MG tablet, Take 20 mg by mouth at bedtime., Disp: , Rfl:  .  OXYGEN-HELIUM IN, Inhale into the lungs. 2 liters, Disp: , Rfl:  .  predniSONE (DELTASONE) 1 MG tablet, 4 mg every morning. Taking 4 44m  tablets  po daily, Disp: , Rfl: 0 .  primidone (MYSOLINE) 50 MG tablet, Take 50 mg by mouth at bedtime., Disp: , Rfl:  .  traMADol (ULTRAM) 50 MG tablet, Take 50 mg by mouth 3 (three) times daily as needed., Disp: , Rfl:  .  brinzolamide (AZOPT) 1 % ophthalmic suspension, Place 1 drop into both eyes 3 (three) times daily. Reported on 08/12/2015, Disp: , Rfl:  .  clonazePAM (KLONOPIN) 1 MG tablet, Take 1 mg by mouth 2 (two) times daily as needed. For anxiety, Disp: , Rfl:    Review of Systems     Objective:   Physical Exam Vitals:   01/18/16 1449  BP: 104/60  Pulse: 90  SpO2: 97%  Weight: 150 lb 9.6 oz (68.3 kg)  Height: 5' 6" (1.676 m)   She is sitting in a wheelchair. She is frail cachectic female with constant had not but looks better than previous visits. Air entry is equal on both sides few to auscultation. Oxygen on. Normal heart sounds. No edema. She is pleasant and oriented 3     Assessment:       ICD-9-CM ICD-10-CM   1. COPD, very severe ([place] 496 J44.9   2. Lung nodule, left upper lobe and left lower lobe 793.11 R91.1   3. Renal cell carcinoma, unspecified laterality (HCC) 189.0 C64.9   4. Malignant neoplasm of left lung, unspecified part of lung (HCC) 162.9 C34.92        Plan:      #COPD and chronic respiratory failure - Lung disease is stable - Continue daily prednisone as before - Continue daily oxygen as before - Continue duo neb  #Lung nodules and lung cancer - No evidence of recurrence on CT chest October 2016 but that is a new left lower lobe nodule - this is stable April 2017 CT  - Do repeat CT chest without contrast in  dec 2017 -  will call with results  #Renal cancer  - No evidence of recurrence on CT October 2016 -do repeat ct abdomen wo cntrast Dec 2017 at time of CT chest - will call with results  #Follow-up -dec 2017  CT scan - will call with results  - but return to see me in 6-7 months or sooner   (> 50% of this 15 min visit spent in face to face counseling or/and coordination of care)  Dr. Murali Ramaswamy, M.D., F.C.C.P Pulmonary and Critical Care Medicine Staff Physician [facility] Pager: [phone], If no answer or between  15:00h - 7:00h: call [phone]  01/18/2016 3:20 PM        

## 2016-01-18 NOTE — Patient Instructions (Addendum)
ICD-9-CM ICD-10-CM   1. COPD, very severe ([place]) 496 J44.9   2. Lung nodule, left upper lobe and left lower lobe 793.11 R91.1   3. Renal cell carcinoma, unspecified laterality (HCC) 189.0 C64.9   4. Malignant neoplasm of left lung, unspecified part of lung (HCC) 162.9 C34.92     #COPD and chronic respiratory failure - Lung disease is stable - Continue daily prednisone as before - Continue daily oxygen as before - Continue duo neb  #Lung nodules and lung cancer - No evidence of recurrence on CT chest October 2016 but that is a new left lower lobe nodule - this is stable April 2017 CT  - Do repeat CT chest without contrast in dec 2017 - will call with results  #Renal cancer  - No evidence of recurrence on CT October 2016 -do repeat ct abdomen wo cntrast Dec 2017 at time of CT chest - will call with results  #Follow-up -dec 2017  CT scan - will call with results  - but return to see me in 6-7 months or sooner

## 2016-02-28 ENCOUNTER — Ambulatory Visit (INDEPENDENT_AMBULATORY_CARE_PROVIDER_SITE_OTHER)
Admission: RE | Admit: 2016-02-28 | Discharge: 2016-02-28 | Disposition: A | Discharge status: Home / Self Care | Payer: Medicare Other | Source: Ambulatory Visit | Attending: Internal Medicine | Admitting: Internal Medicine

## 2016-02-28 DIAGNOSIS — C649 Malignant neoplasm of unspecified kidney, except renal pelvis: Secondary | ICD-10-CM | POA: Diagnosis not present

## 2016-02-28 DIAGNOSIS — R911 Solitary pulmonary nodule: Secondary | ICD-10-CM | POA: Diagnosis not present

## 2016-03-15 ENCOUNTER — Telehealth: Payer: Self-pay | Admitting: Internal Medicine

## 2016-03-15 DIAGNOSIS — C3432 Malignant neoplasm of lower lobe, left bronchus or lung: Secondary | ICD-10-CM

## 2016-03-15 DIAGNOSIS — C649 Malignant neoplasm of unspecified kidney, except renal pelvis: Secondary | ICD-10-CM

## 2016-03-15 NOTE — Telephone Encounter (Signed)
1. Lung nodules are all stable but LLL nodule is worse - all c/w mets per radiology -> refer med onc to see if she will benefit from sutent   2. New problem left kideny since sept 2016  -> refer back to urology  3. Keep appt with in April 2018  IMPRESSION: 1. Multifocal pulmonary nodules are again noted compatible with metastatic disease. Most of these are stable with the exception of a left lower lobe nodule which has increased in size in the interval. 2. Status post right nephrectomy without evidence for local tumor recurrence. 3. Multiple lesions of varying complexity are identified within the left kidney. Arising from the inferior pole of the left kidney is an intermediate attenuating lesion which may represent a hemorrhagic/proteinaceous cyst or solid mass. This is new when compared with 12/15/2014. More definitive assessment of this nodule may be obtained with a contrast enhanced MRI or CT of the abdomen (renal mass protocol). 4. Aortic atherosclerosis.   Electronically Signed By: Kerby Moors M.D. On: 02/29/2016 10:34

## 2016-03-17 NOTE — Telephone Encounter (Signed)
Attempted to call pt. Received a fast busy signal x2. Will try back. 

## 2016-03-21 NOTE — Telephone Encounter (Signed)
Called and spoke to pt. Informed her of the results and recs per MR. Orders placed for referrals. Pt verbalized understanding and denied any further questions or concerns at this time.

## 2016-03-22 ENCOUNTER — Encounter: Payer: Self-pay | Admitting: Internal Medicine

## 2016-03-22 ENCOUNTER — Telehealth: Payer: Self-pay | Admitting: Internal Medicine

## 2016-03-22 NOTE — Telephone Encounter (Signed)
Appt scheduled w/Melanie Best on 1/12 at 915am w/845am labs. Pt aware to arrive at 830am. Pt has [employer] as her new insurance. She was unable to locate it at the time. Demographics verified. Letter mailed to the pt.

## 2016-03-31 ENCOUNTER — Telehealth: Payer: Self-pay | Admitting: Internal Medicine

## 2016-03-31 ENCOUNTER — Other Ambulatory Visit (HOSPITAL_BASED_OUTPATIENT_CLINIC_OR_DEPARTMENT_OTHER): Payer: Medicare Other

## 2016-03-31 ENCOUNTER — Encounter: Payer: Self-pay | Admitting: Internal Medicine

## 2016-03-31 ENCOUNTER — Ambulatory Visit (HOSPITAL_BASED_OUTPATIENT_CLINIC_OR_DEPARTMENT_OTHER): Payer: Medicare Other | Admitting: Internal Medicine

## 2016-03-31 ENCOUNTER — Other Ambulatory Visit: Payer: Self-pay | Admitting: Medical Oncology

## 2016-03-31 VITALS — BP 140/75 | HR 106 | Temp 98.4°F | Resp 18 | Ht 66.0 in | Wt 154.6 lb

## 2016-03-31 DIAGNOSIS — N289 Disorder of kidney and ureter, unspecified: Secondary | ICD-10-CM | POA: Diagnosis not present

## 2016-03-31 DIAGNOSIS — E119 Type 2 diabetes mellitus without complications: Secondary | ICD-10-CM | POA: Diagnosis not present

## 2016-03-31 DIAGNOSIS — C3432 Malignant neoplasm of lower lobe, left bronchus or lung: Secondary | ICD-10-CM

## 2016-03-31 DIAGNOSIS — R911 Solitary pulmonary nodule: Secondary | ICD-10-CM

## 2016-03-31 DIAGNOSIS — J449 Chronic obstructive pulmonary disease, unspecified: Secondary | ICD-10-CM | POA: Diagnosis not present

## 2016-03-31 DIAGNOSIS — Z8553 Personal history of malignant neoplasm of renal pelvis: Secondary | ICD-10-CM

## 2016-03-31 DIAGNOSIS — Z808 Family history of malignant neoplasm of other organs or systems: Secondary | ICD-10-CM

## 2016-03-31 DIAGNOSIS — Z801 Family history of malignant neoplasm of trachea, bronchus and lung: Secondary | ICD-10-CM

## 2016-03-31 DIAGNOSIS — C641 Malignant neoplasm of right kidney, except renal pelvis: Secondary | ICD-10-CM

## 2016-03-31 DIAGNOSIS — C3411 Malignant neoplasm of upper lobe, right bronchus or lung: Secondary | ICD-10-CM

## 2016-03-31 LAB — CBC WITH DIFFERENTIAL/PLATELET
BASO%: 1.2 % (ref 0.0–2.0)
BASOS ABS: 0.1 10*3/uL (ref 0.0–0.1)
EOS%: 4.7 % (ref 0.0–7.0)
Eosinophils Absolute: 0.5 10*3/uL (ref 0.0–0.5)
HCT: 32.9 % — ABNORMAL LOW (ref 34.8–46.6)
HGB: 11.1 g/dL — ABNORMAL LOW (ref 11.6–15.9)
LYMPH#: 4 10*3/uL — AB (ref 0.9–3.3)
LYMPH%: 35.4 % (ref 14.0–49.7)
MCH: 33.4 pg (ref 25.1–34.0)
MCHC: 33.7 g/dL (ref 31.5–36.0)
MCV: 99.2 fL (ref 79.5–101.0)
MONO#: 0.6 10*3/uL (ref 0.1–0.9)
MONO%: 5.6 % (ref 0.0–14.0)
NEUT#: 6 10*3/uL (ref 1.5–6.5)
NEUT%: 53.1 % (ref 38.4–76.8)
PLATELETS: 265 10*3/uL (ref 145–400)
RBC: 3.32 10*6/uL — ABNORMAL LOW (ref 3.70–5.45)
RDW: 13.7 % (ref 11.2–14.5)
WBC: 11.4 10*3/uL — ABNORMAL HIGH (ref 3.9–10.3)

## 2016-03-31 LAB — COMPREHENSIVE METABOLIC PANEL
ALT: 24 U/L (ref 0–55)
AST: 23 U/L (ref 5–34)
Albumin: 3.5 g/dL (ref 3.5–5.0)
Alkaline Phosphatase: 83 U/L (ref 40–150)
Anion Gap: 10 mEq/L (ref 3–11)
BILIRUBIN TOTAL: 0.22 mg/dL (ref 0.20–1.20)
BUN: 15.6 mg/dL (ref 7.0–26.0)
CHLORIDE: 105 meq/L (ref 98–109)
CO2: 27 mEq/L (ref 22–29)
Calcium: 9 mg/dL (ref 8.4–10.4)
Creatinine: 0.9 mg/dL (ref 0.6–1.1)
EGFR: 75 mL/min/{1.73_m2} — ABNORMAL LOW (ref >90)
Glucose: 91 mg/dl (ref 70–140)
Potassium: 3.5 mEq/L (ref 3.5–5.1)
Sodium: 142 mEq/L (ref 136–145)
Total Protein: 6.8 g/dL (ref 6.4–8.3)

## 2016-03-31 NOTE — Telephone Encounter (Signed)
Appointments scheduled per 1/12 LOS. Patient given AVS report and calendars with future scheduled appointments. Patient aware of MRI and PET scans to be scheduled.

## 2016-03-31 NOTE — Progress Notes (Signed)
[place] Telephone:([phone]) [phone]   Fax:([phone]) [phone]  CONSULT NOTE  REFERRING PHYSICIAN: Dr. Brand Males  REASON FOR CONSULTATION:  71 years old African-American female with recurrent lung cancer.  HPI Melanie Best is a 71 y.o. female was past medical history significant for COPD, diabetes mellitus, dyslipidemia, history of renal cell carcinoma (T3b, NX, MX) status post right nephrectomy on 12/01/2008 under the care of Dr. Diona Fanti. In July 2013 the patient was found to have right upper lobe lung nodule and she underwent a PET scan on 10/18/2011 and it showed hypermetabolic right upper lobe spiculated nodule most consistent with primary bronchogenic carcinoma, T1b, N0, M0. There was no evidence of metastatic disease from the renal cell carcinoma. Repeat CT scan of the chest without contrast on 11/15/2011 showed stable spiculated right upper lobe lung mass consistent with neoplasm. There was 0.5 x 0.4 cm adjacent nodule and 0.4 cm lower lobe pulmonary nodule. There was also 1.0 x 1.3 cm central left lower lobe pulmonary nodule and new from 2011 there was 0.7 x 0.9 cm pulmonary nodule. On 11/29/2011 she underwent flexible bronchoscopy with electromagnetic navigational bronchoscopy as well as brushing of the right upper lobe nodule and bronchoalveolar lavage of the right upper lobe. There was no evidence for malignancy except for the bronchoalveolar lavage which showed atypical cells. The patient underwent stereotactic radiotherapy to the right upper lobe lung nodule under the care of Dr. Pablo Ledger in October 2013. She was followed by observation at that time. In February 2015 the patient had increase in size of left lower lobe pulmonary nodule. She was seen by Dr. Pablo Ledger and underwent stereotactic radiotherapy to the left lower lobe pulmonary nodule on 06/12/2013. She was followed by observation again with repeat CT scan of the chest at regular interval. On 02/29/2016 the  patient had repeat CT scan of the chest, abdomen and pelvis without contrast. It showed multifocal pulmonary nodules again noted compatible with metastatic disease. Most of these are stable with the exception of a left lower lobe nodule which has increased in size in the interval. The patient is status post right nephrectomy without evidence for local tumor recurrence. There was also multiple lesions of varying complexity identified within the left kidney. Arising from the inferior pole of the left kidney is an intermediate attenuating lesion which may represent hemorrhagic/proteinaceous cyst or solid mass. This is new when compared to 12/15/2014. Dr. Chase Caller kindly referred the patient to me today for evaluation and recommendation regarding the new abnormalities seen on the recent CT scan of the chest, abdomen and pelvis. When seen today she continues to complain of shortness of breath at baseline and increased with exertion and she is currently on home oxygen. She also has mild cough with no hemoptysis. She denied having any chest pain. She has no weight loss or night sweats. She denied having any headaches but has some visual changes. The patient denied having any nausea, vomiting, diarrhea or constipation.  HPI  Past Medical History:  Diagnosis Date  . Anemia   . Anxiety   . Arthritis    R leg  . Cancer ([place])    kidney  . CKD (chronic kidney disease), stage II    removed R kidney- 2010- for Cancer, followed by Dr. Diona Fanti  . COPD (chronic obstructive pulmonary disease) ([facility])    uses O2-2 liters , continuously , followed by Dr. Chase Caller  . Cough 08-02-2015  . Diabetes mellitus, type 2 ([place])   . DM (  diabetes mellitus) ([place]) 01/27/2012  . DVT (deep venous thrombosis), left    2011- treated /w coumadin   . Dyslipidemia   . Esophageal reflux   . GERD (gastroesophageal reflux disease) 01/27/2012  . Glaucoma   . Hemoptysis 08-02-15  . History of radiation therapy  01/09/12,01/11/12,01/15/12,01/17/12,& 01/19/12   rul lung,50Gy/28f  . HTN (hypertension) 01/27/2012  . Hyperlipidemia 01/27/2012  . Hypertension    sees Dr. HDeforest Hoylesfor PCP, denies ever having stress or ECHO, ?when & where she had  last  EKG  . Leukocytosis   [place]  . Osteopenia   . Shortness of breath   . Syncope   . Tremor   . Urinary incontinence   . Vitamin D deficiency     Past Surgical History:  Procedure Laterality Date  . BACK SURGERY     [facility] . EYE SUR[facility]ERY     cataracts removed, ?IOL  . FLEXIBLE BRONCHOSCOPY  11/29/2011  . right nephrectomy      Family History  Problem Relation Age of Onset  . Emphysema Father   . Cancer Sister     behind eye  . Cancer Brother     lung  . Cancer Brother     lung  . Cancer Brother     throat    Social History Social History  Substance Use Topics  . Smoking status: Former Smoker    Packs/day: 1.00    Years: 35.00    Types: Cigarettes    Quit date: 03/20/2004  . Smokeless tobacco: Never Used  . Alcohol use No    No Known Allergies  Current Outpatient Prescriptions  Medication Sig Dispense Refill  . aspirin 81 MG tablet Take 81 mg by mouth daily.    . bimatoprost (LUMIGAN) 0.01 % SOLN Place 1 drop into both eyes at bedtime.     . Blood Glucose Monitoring Suppl (ONE TOUCH ULTRA SYSTEM KIT) w/Device KIT 50 kits by Does not apply route once. Testing as directed    . brinzolamide (AZOPT) 1 % ophthalmic suspension Place 1 drop into both eyes 3 (three) times daily. Reported on 08/12/2015    . Calcium Carb-Cholecalciferol (CALCIUM 500 +D) 500-400 MG-UNIT TABS Take 1 tablet by mouth 2 times daily at 12 noon and 4 pm.     . clonazePAM (KLONOPIN) 1 MG tablet Take 1 mg by mouth 2 (two) times daily as needed. For anxiety    . diltiazem (CARDIZEM CD) 180 MG 24 hr capsule Take 180 mg by mouth daily.  0  . ergocalciferol (VITAMIN D2) 50000 UNITS capsule Take 50,000 Units by mouth  once a week. Take on Monday    . esomeprazole (NEXIUM) 40 MG capsule Take 40 mg by mouth daily before breakfast.     . furosemide (LASIX) 20 MG tablet Take 20 mg by mouth daily as needed. For fluid retention    . hydrOXYzine (ATARAX/VISTARIL) 25 MG tablet Take 25 mg by mouth every 8 (eight) hours as needed. Reported on 08/12/2015    . ipratropium-albuterol (DUONEB) 0.5-2.5 (3) MG/3ML SOLN Take 3 mLs by nebulization 4 (four) times daily as needed. 360 mL 4  . JANUVIA 50 MG tablet Take 50 mg by mouth daily.     .Marland Kitchenlovastatin (MEVACOR) 20 MG tablet Take 20 mg by mouth at bedtime.    .Donell SievertIN Inhale into the lungs. 2 liters    . predniSONE (DELTASONE) 1 MG  tablet 4 mg every morning. Taking 4 32m  tablets  po daily  0  . primidone (MYSOLINE) 50 MG tablet Take 50 mg by mouth at bedtime.    . traMADol (ULTRAM) 50 MG tablet Take 50 mg by mouth 3 (three) times daily as needed.     No current facility-administered medications for this visit.     Review of Systems  Constitutional: positive for fatigue Eyes: negative Ears, nose, mouth, throat, and face: negative Respiratory: positive for cough, dyspnea on exertion and sputum Cardiovascular: negative Gastrointestinal: negative Genitourinary:negative Integument/breast: negative Hematologic/lymphatic: negative Musculoskeletal:positive for muscle weakness Neurological: negative Behavioral/Psych: negative Endocrine: negative Allergic/Immunologic: negative  Physical Exam  RXLK:GMWNU healthy, no distress, well nourished and well developed SKIN: skin color, texture, turgor are normal, no rashes or significant lesions HEAD: Normocephalic, No masses, lesions, tenderness or abnormalities EYES: normal, PERRLA, Conjunctiva are pink and non-injected EARS: External ears normal, Canals clear OROPHARYNX:no exudate, no erythema and lips, buccal mucosa, and tongue normal  NECK: supple, no adenopathy, no JVD LYMPH:  no palpable lymphadenopathy, no  hepatosplenomegaly BREAST:not examined LUNGS: expiratory wheezes bilaterally HEART: regular rate & rhythm, no murmurs and no gallops ABDOMEN:abdomen soft, non-tender, normal bowel sounds and no masses or organomegaly BACK: Back symmetric, no curvature., No CVA tenderness EXTREMITIES:no joint deformities, effusion, or inflammation, no edema, no skin discoloration  NEURO: alert & oriented x 3 with fluent speech, no focal motor/sensory deficits  PERFORMANCE STATUS: ECOG 1-2  LABORATORY DATA: Lab Results  Component Value Date   WBC 11.4 (H) 03/31/2016   HGB 11.1 (L) 03/31/2016   HCT 32.9 (L) 03/31/2016   MCV 99.2 03/31/2016   PLT 265 03/31/2016      Chemistry      Component Value Date/Time   NA 142 03/31/2016 0854   K 3.5 03/31/2016 0854   CL 101 02/19/2012 1312   CO2 27 03/31/2016 0854   BUN 15.6 03/31/2016 0854   CREATININE 0.9 03/31/2016 0854      Component Value Date/Time   CALCIUM 9.0 03/31/2016 0854   ALKPHOS 83 03/31/2016 0854   AST 23 03/31/2016 0854   ALT 24 03/31/2016 0854   BILITOT 0.22 03/31/2016 0854       RADIOGRAPHIC STUDIES: No results found.  ASSESSMENT:This is a very pleasant 71years old African-American female with questionable metastatic non-small cell lung cancer versus metastatic renal cell carcinoma. The patient has a history of renal cell carcinoma status post right nephrectomy in September 2010. She also has multiple bilateral pulmonary nodules that were treated with stereotactic radiotherapy without confirmation of the tissue diagnosis but based on the hypermetabolic activity of the nodules.   PLAN: I had a lengthy discussion with the patient and her husband today about her current condition and further investigation to confirm her diagnosis. I spent extensive time reviewing her records from 2010 until now. The patient has no confirmation of non-small cell lung cancer but the pulmonary nodules are highly suspicious for primary bronchogenic  carcinoma but metastatic renal carcinoma cannot be excluded. I recommended for the patient to have repeat PET scan as well as MRI of the brain to complete the staging workup for her disease. Based on the PET scan results, we may be able to select a lesion for repeat biopsy and confirmation of the tissue diagnosis. It is prudent to confirm the tissue diagnosis before considering the patient for any systemic therapy. For the questionable renal mass, I may consider the patient for MRI of the abdomen with renal protocol  if the PET scan was not conclusive for the diagnosis. The patient would come back for follow-up visit in 2 weeks for evaluation and more detailed discussion of her treatment options after the staging workup. For the diabetes mellitus, the patient will continue her current treatment with Januvia For COPD, she is currently on prednisone as well as DuoNeb and home oxygen. The patient was advised to call immediately if she has any concerning symptoms in the interval.  The patient voices understanding of current disease status and treatment options and is in agreement with the current care plan.  All questions were answered. The patient knows to call the clinic with any problems, questions or concerns. We can certainly see the patient much sooner if necessary.  Thank you so much for allowing me to participate in the care of Melanie Best. I will continue to follow up the patient with you and assist in her care.  I spent 55 minutes counseling the patient face to face. The total time spent in the appointment was 80 minutes.  Disclaimer: This note was dictated with voice recognition software. Similar sounding words can inadvertently be transcribed and may not be corrected upon review.   MOHAMED,MOHAMED K. March 31, 2016, 9:54 AM

## 2016-04-06 ENCOUNTER — Ambulatory Visit (HOSPITAL_COMMUNITY): Payer: Medicare Other

## 2016-04-11 ENCOUNTER — Ambulatory Visit (HOSPITAL_COMMUNITY)
Admission: RE | Admit: 2016-04-11 | Discharge: 2016-04-11 | Disposition: A | Discharge status: Home / Self Care | Payer: Medicare Other | Source: Ambulatory Visit | Attending: Internal Medicine | Admitting: Internal Medicine

## 2016-04-11 DIAGNOSIS — C3411 Malignant neoplasm of upper lobe, right bronchus or lung: Secondary | ICD-10-CM | POA: Diagnosis present

## 2016-04-11 DIAGNOSIS — N289 Disorder of kidney and ureter, unspecified: Secondary | ICD-10-CM | POA: Insufficient documentation

## 2016-04-11 DIAGNOSIS — J323 Chronic sphenoidal sinusitis: Secondary | ICD-10-CM | POA: Insufficient documentation

## 2016-04-11 DIAGNOSIS — J322 Chronic ethmoidal sinusitis: Secondary | ICD-10-CM | POA: Insufficient documentation

## 2016-04-11 DIAGNOSIS — C641 Malignant neoplasm of right kidney, except renal pelvis: Secondary | ICD-10-CM | POA: Diagnosis present

## 2016-04-11 DIAGNOSIS — J449 Chronic obstructive pulmonary disease, unspecified: Secondary | ICD-10-CM

## 2016-04-11 DIAGNOSIS — C3432 Malignant neoplasm of lower lobe, left bronchus or lung: Secondary | ICD-10-CM | POA: Insufficient documentation

## 2016-04-11 MED ORDER — GADOBENATE DIMEGLUMINE 529 MG/ML IV SOLN
15.0000 mL | Freq: Once | INTRAVENOUS | Status: AC | PRN
  Administered 2016-04-11: 14 mL via INTRAVENOUS

## 2016-04-12 ENCOUNTER — Ambulatory Visit (HOSPITAL_COMMUNITY)
Admission: RE | Admit: 2016-04-12 | Discharge: 2016-04-12 | Disposition: A | Discharge status: Home / Self Care | Payer: Medicare Other | Source: Ambulatory Visit | Attending: Internal Medicine | Admitting: Internal Medicine

## 2016-04-12 DIAGNOSIS — C641 Malignant neoplasm of right kidney, except renal pelvis: Secondary | ICD-10-CM

## 2016-04-12 DIAGNOSIS — N289 Disorder of kidney and ureter, unspecified: Secondary | ICD-10-CM

## 2016-04-12 DIAGNOSIS — J449 Chronic obstructive pulmonary disease, unspecified: Secondary | ICD-10-CM

## 2016-04-12 DIAGNOSIS — C3432 Malignant neoplasm of lower lobe, left bronchus or lung: Secondary | ICD-10-CM

## 2016-04-12 DIAGNOSIS — C3411 Malignant neoplasm of upper lobe, right bronchus or lung: Secondary | ICD-10-CM

## 2016-04-12 LAB — GLUCOSE, CAPILLARY: GLUCOSE-CAPILLARY: 109 mg/dL — AB (ref 65–99)

## 2016-04-12 MED ORDER — FLUDEOXYGLUCOSE F - 18 (FDG) INJECTION
8.1300 | Freq: Once | INTRAVENOUS | Status: AC | PRN
  Administered 2016-04-12: 8.13 via INTRAVENOUS

## 2016-04-19 ENCOUNTER — Ambulatory Visit: Payer: Medicare Other | Admitting: Internal Medicine

## 2016-05-04 ENCOUNTER — Telehealth: Payer: Self-pay | Admitting: Internal Medicine

## 2016-05-04 NOTE — Telephone Encounter (Signed)
Patient called to get results of scans and was unaware of her appointment on 04/19/2016 and needs to reschedule.

## 2016-05-05 ENCOUNTER — Telehealth: Payer: Self-pay | Admitting: Internal Medicine

## 2016-05-05 NOTE — Telephone Encounter (Signed)
sw pt to confirm 2/22 appt at 1145 am per telephone encounter

## 2016-05-11 ENCOUNTER — Telehealth: Payer: Self-pay | Admitting: Internal Medicine

## 2016-05-11 ENCOUNTER — Encounter: Payer: Self-pay | Admitting: Internal Medicine

## 2016-05-11 ENCOUNTER — Ambulatory Visit (HOSPITAL_BASED_OUTPATIENT_CLINIC_OR_DEPARTMENT_OTHER): Payer: Medicare Other | Admitting: Internal Medicine

## 2016-05-11 VITALS — BP 109/67 | HR 112 | Temp 99.2°F | Resp 18 | Ht 66.0 in | Wt 159.2 lb

## 2016-05-11 DIAGNOSIS — C641 Malignant neoplasm of right kidney, except renal pelvis: Secondary | ICD-10-CM

## 2016-05-11 DIAGNOSIS — Z8553 Personal history of malignant neoplasm of renal pelvis: Secondary | ICD-10-CM | POA: Diagnosis not present

## 2016-05-11 DIAGNOSIS — J449 Chronic obstructive pulmonary disease, unspecified: Secondary | ICD-10-CM

## 2016-05-11 DIAGNOSIS — R918 Other nonspecific abnormal finding of lung field: Secondary | ICD-10-CM

## 2016-05-11 DIAGNOSIS — C3432 Malignant neoplasm of lower lobe, left bronchus or lung: Secondary | ICD-10-CM

## 2016-05-11 NOTE — Telephone Encounter (Signed)
Follow up appointments confirmed with patient , per 05/11/16 los.

## 2016-05-11 NOTE — Progress Notes (Signed)
[place] [phone]   Fax:([phone]) [address] [place] [address]  DIAGNOSIS:  1) Multiple bilateral pulmonary nodules 2) history of renal cell carcinoma status post right nephrectomy in September 2010.  PRIOR THERAPY: Stereotactic radiotherapy to pulmonary nodules based on the metabolic activity with no tissue diagnosis. This was performed under the care of Dr. Pablo Ledger.  CURRENT THERAPY: Observation.  INTERVAL HISTORY: PRISTINE GLADHILL 71 y.o. female returns to the clinic today for follow-up visit accompanied by her husband. The patient is feeling fine today with no specific complaints except for the baseline shortness of breath and she is currently on home oxygen. She denied having any chest pain, cough or hemoptysis. She has no weight loss or night sweats. She denied having any nausea, vomiting, diarrhea or constipation. The patient was seen a few weeks ago for evaluation of pulmonary nodules on CT scan of the chest. I ordered a PET scan and she is here for evaluation and discussion of her scan results and recommendation regarding her condition.  MEDICAL HISTORY: Past Medical History:  Diagnosis Date  . Anemia   . Anxiety   . Arthritis    R leg  . Cancer ([place])    kidney  . CKD (chronic kidney disease), stage II    removed R kidney- 2010- for Cancer, followed by Dr. Diona Fanti  . COPD (chronic obstructive pulmonary disease) ([facility])    uses O2-2 liters , continuously , followed by Dr. Chase Caller  . Cough 08-02-2015  . Diabetes mellitus, type 2 ([place])   . DM (diabetes mellitus) ([place]) 01/27/2012  . DVT (deep venous thrombosis), left    2011- treated /w coumadin   . Dyslipidemia   . Esophageal reflux   . GERD (gastroesophageal reflux disease) 01/27/2012  . Glaucoma   . Hemoptysis 08-02-15  . History of radiation therapy 01/09/12,01/11/12,01/15/12,01/17/12,& 01/19/12   rul  lung,50Gy/29f  . HTN (hypertension) 01/27/2012  . Hyperlipidemia 01/27/2012  . Hypertension    sees Dr. HDeforest [place] PCP, denies ever having stress or ECHO, ?when & where she had  last  EKG  . Leukocytosis   . Lung cancer ([place]    NSCLC  . Osteopenia   . Shortness of breath   . Syncope   . Tremor   . Urinary incontinence   . Vitamin D deficiency     ALLERGIES:  has No Known Allergies.  MEDICATIONS:  Current Outpatient Prescriptions  Medication Sig Dispense Refill  . aspirin 81 MG tablet Take 81 mg by mouth daily.    . bimatoprost (LUMIGAN) 0.01 % SOLN Place 1 drop into both eyes at bedtime.     . Blood Glucose Monitoring Suppl (ONE TOUCH ULTRA SYSTEM KIT) w/Device KIT 50 kits by Does not apply route once. Testing as directed    . brinzolamide (AZOPT) 1 % ophthalmic suspension Place 1 drop into both eyes 3 (three) times daily. Reported on 08/12/2015    . Calcium Carb-Cholecalciferol (CALCIUM 500 +D) 500-400 MG-UNIT TABS Take 1 tablet by mouth 2 times daily at 12 noon and 4 pm.     . clonazePAM (KLONOPIN) 1 MG tablet Take 1 mg by mouth 2 (two) times daily as needed. For anxiety    . diltiazem (CARDIZEM CD) 180 MG 24 hr capsule Take 180 mg by mouth daily.  0  . ergocalciferol (VITAMIN D2) 50000 UNITS capsule Take 50,000 Units by mouth once a week.  Take on Monday    . esomeprazole (NEXIUM) 40 MG capsule Take 40 mg by mouth daily before breakfast.     . furosemide (LASIX) 20 MG tablet Take 20 mg by mouth daily as needed. For fluid retention    . hydrOXYzine (ATARAX/VISTARIL) 25 MG tablet Take 25 mg by mouth every 8 (eight) hours as needed. Reported on 08/12/2015    . ipratropium-albuterol (DUONEB) 0.5-2.5 (3) MG/3ML SOLN Take 3 mLs by nebulization 4 (four) times daily as needed. 360 mL 4  . JANUVIA 50 MG tablet Take 50 mg by mouth daily.     Marland Kitchen lovastatin (MEVACOR) 20 MG tablet Take 20 mg by mouth at bedtime.    Donell Sievert IN Inhale into the lungs. 2 liters    . predniSONE (DELTASONE) 1  MG tablet 4 mg every morning. Taking 4 68m  tablets  po daily  0  . primidone (MYSOLINE) 50 MG tablet Take 50 mg by mouth at bedtime.    . traMADol (ULTRAM) 50 MG tablet Take 50 mg by mouth 3 (three) times daily as needed.     No current facility-administered medications for this visit.     SURGICAL HISTORY:  Past Surgical History:  Procedure Laterality Date  . BACK SURGERY     [facility] EYE SURGERY     cataracts removed, ?IOL  . FLEXIBLE BRONCHOSCOPY  11/29/2011  . right nephrectomy      REVIEW OF SYSTEMS:  Constitutional: negative Eyes: negative Ears, nose, mouth, throat, and face: negative Respiratory: positive for cough and dyspnea on exertion Cardiovascular: negative Gastrointestinal: negative Genitourinary:negative Integument/breast: negative Hematologic/lymphatic: negative Musculoskeletal:positive for muscle weakness Neurological: negative Behavioral/Psych: negative Endocrine: negative Allergic/Immunologic: negative   PHYSICAL EXAMINATION: General appearance: alert, cooperative, fatigued and no distress Head: Normocephalic, without obvious abnormality, atraumatic Neck: no adenopathy, no JVD, supple, symmetrical, trachea midline and thyroid not enlarged, symmetric, no tenderness/mass/nodules Lymph nodes: Cervical, supraclavicular, and axillary nodes normal. Resp: wheezes bilaterally Back: symmetric, no curvature. ROM normal. No CVA tenderness. Cardio: regular rate and rhythm, S1, S2 normal, no murmur, click, rub or gallop GI: soft, non-tender; bowel sounds normal; no masses,  no organomegaly Extremities: extremities normal, atraumatic, no cyanosis or edema Neurologic: Alert and oriented X 3, normal strength and tone. Normal symmetric reflexes. Normal coordination and gait  ECOG PERFORMANCE STATUS: 1 - Symptomatic but completely ambulatory  Blood pressure 109/67, pulse (!) 112, temperature 99.2 F (37.3 C), temperature source Oral,  resp. rate 18, height '5\' 6"'  (1.676 m), weight 159 lb 3.2 oz (72.2 kg), SpO2 97 %.  LABORATORY DATA: Lab Results  Component Value Date   WBC 11.4 (H) 03/31/2016   HGB 11.1 (L) 03/31/2016   HCT 32.9 (L) 03/31/2016   MCV 99.2 03/31/2016   PLT 265 03/31/2016      Chemistry      Component Value Date/Time   NA 142 03/31/2016 0854   K 3.5 03/31/2016 0854   CL 101 02/19/2012 1312   CO2 27 03/31/2016 0854   BUN 15.6 03/31/2016 0854   CREATININE 0.9 03/31/2016 0854      Component Value Date/Time   CALCIUM 9.0 03/31/2016 0854   ALKPHOS 83 03/31/2016 0854   AST 23 03/31/2016 0854   ALT 24 03/31/2016 0854   BILITOT 0.22 03/31/2016 0854       RADIOGRAPHIC STUDIES: Mr BJeri CosWHKContrast  Result Date: 04/11/2016 CLINICAL DATA:  71year old female with primary versus metastatic lung cancer (personal history  of 2010 renal cell carcinoma) status post stereotactic radiotherapy to bilateral lung nodules. Staging of the brain. Subsequent encounter. EXAM: MRI HEAD WITHOUT AND WITH CONTRAST TECHNIQUE: Multiplanar, multiecho pulse sequences of the brain and surrounding structures were obtained without and with intravenous contrast. CONTRAST:  55m MULTIHANCE GADOBENATE DIMEGLUMINE 529 MG/ML IV SOLN COMPARISON:  Cervical spine MRI 09/15/2012 FINDINGS: Brain: No abnormal enhancement identified. No midline shift, mass effect, or evidence of intracranial mass lesion. No dural thickening. No restricted diffusion to suggest acute infarction. No ventriculomegaly, extra-axial collection or acute intracranial hemorrhage. Cervicomedullary junction and pituitary are within normal limits. GPearline Cablesand white matter signal is within normal limits for age throughout the brain. No encephalomalacia or chronic cerebral blood products identified. Vascular: Major intracranial vascular flow voids are preserved. Skull and upper cervical spine: Chronic disc and endplate degeneration at C3-C4 resulting in a mild degree of  degenerative spinal stenosis. Otherwise negative visualized cervical spinal cord. Bone marrow signal in the cervical spine, throughout the skull and visualized facial bones is normal. Sinuses/Orbits: Subtotal opacification of the right sphenoid sinus, bilateral ethmoids, and both frontal sinuses. Moderate opacification of the left sphenoid. This appears related to a combination of mucosal thickening and retained secretions. The maxillary sinuses are spared. Postoperative changes to both globes, otherwise negative orbits soft tissues. Other: Visible internal auditory structures appear normal. Mastoids are clear. No suspicious scalp or face soft tissue lesion identified; multiple small T2 hyperintense right parotid nodules (series 10, images 13 through 15) appear benign. IMPRESSION: 1. No metastatic disease or acute intracranial abnormality identified. 2. Bilateral paranasal sinus disease sparing the maxillary sinuses. Electronically Signed   By: HGenevie AnnM.D.   On: 04/11/2016 15:03   Nm Pet Image Restag (ps) Skull Base To Thigh  Result Date: 04/12/2016 CLINICAL DATA:  Subsequent treatment strategy for metastatic lung cancer. EXAM: NUCLEAR MEDICINE PET SKULL BASE TO THIGH TECHNIQUE: 8.1 mCi F-18 FDG was injected intravenously. Full-ring PET imaging was performed from the skull base to thigh after the radiotracer. CT data was obtained and used for attenuation correction and anatomic localization. FASTING BLOOD GLUCOSE:  Value: 113 mg/dl COMPARISON:  Multiple exams, including 10/18/2011 and 02/28/2016 FINDINGS: NECK No hypermetabolic lymph nodes in the neck. Chronic ethmoid and sphenoid sinusitis. CHEST Severe emphysema. An 8 by 6 mm right lower lobe pulmonary nodule on image 43/8 has a maximum standard uptake value of a 1.5. There is nodularity along scarring along the upper part of the right major fissure in the vicinity of prior tumor resection, maximum SUV in this vicinity is 2.9. In the left lower lobe there is  what appears to be focal expansion of the left lower lobe pulmonary artery extending into several adjacent vessels with associated lobularity. This has slowly increased from 05/30/2012. The airways is seen separately and this is not thought to be a bronchocele. Maximum SUV in this region 4.7, formerly 2.5. This tracks in a tubular like fashion along the pulmonary arterial tree. Background mediastinal blood pool activity 3.6 ABDOMEN/PELVIS Absent right kidney. Aortoiliac atherosclerotic vascular disease. Physiologic activity in bowel. Scattered lesions of various density of present in the left kidney, some of these are complex including a 9 mm hyperdense lesion anteriorly in the left mid kidney on image 113/4. These are too small to assess for hypermetabolic activity given the high background activity of the kidney. Some of these lesions were present back in 2013. Dependent densities in the gallbladder compatible with cholelithiasis or sludge. SKELETON No focal hypermetabolic activity to  suggest skeletal metastasis. IMPRESSION: 1. Focal lobular enlargement of the left lower lobe pulmonary artery and several branches, possibly due to perivascular tumor or less likely a very unusual pulmonary arterial aneurysm (such aneurysms are very rare). There is accentuated activity in this vicinity, maximum SUV 4.7, formerly 2.5 back in 2013, with background mediastinal blood pool activity at 3.6. Accordingly this is faintly increased. If the patient is a candidate for contrast-enhanced CT I would recommend contrast-enhanced CT angiography of the chest to differentiate pulmonary artery from surrounding soft tissue/mass. Peribronchovascular nodularity in the setting of sarcoidosis is a possibility, although seems unlikely given the lack of mediastinal adenopathy. Low-grade adenocarcinoma tracking along the pulmonary arterial tree is not readily excluded given the mildly increased activity. 2. The 7 mm average size right lower lobe  pulmonary nodule has a maximum standard uptake value of only 1.5, below typical thresholds, but merits surveillance as it is at the lower margin of sensitive PET-CT assessment based on size. 3. Nodularity along the right major fissure in the area of prior tumor resection, maximum SUV 2.9, may be inflammatory, merits surveillance. 4. Severe emphysema. 5. Considerable bilateral ethmoid and sphenoid sinusitis. 6. Scattered lesions at various density in the left kidney, some are complex. These are too small to assess for hypermetabolic activity given the high background activity in the kidney. Some more present back in 2013. Electronically Signed   By: Van Clines M.D.   On: 04/12/2016 14:49    ASSESSMENT AND PLAN: This is a very pleasant 71 years old African-American female with history of renal cell carcinoma as well as bilateral pulmonary nodules suspicious for malignancy. She was treated with stereotactic radiotherapy to hypermetabolic nodules without tissue diagnosis in the past. She continues to have suspicious pulmonary nodules. She had a PET scan performed recently. I personally and independently reviewed the PET scan images and discuss the results with the patient and her husband. The Pulmonary nodules in the right lower lobe and the right major fissure have very low hypermetabolic activity and height is suspicious to be inflammatory but close observation is recommended. Her MRI of the brain showed no evidence for metastatic disease to the brain. I recommended for the patient to continue on observation with repeat CT scan of the chest in 6 months for reevaluation of her disease. For COPD, the patient will continue her current treatment with Duoneb. She will also continue her routine evaluation under the care of Dr. Chase Caller. The patient was advised to call immediately if she has any concerning symptoms in the interval. The patient voices understanding of current disease status and treatment  options and is in agreement with the current care plan.  All questions were answered. The patient knows to call the clinic with any problems, questions or concerns. We can certainly see the patient much sooner if necessary.  I spent 15 minutes counseling the patient face to face. The total time spent in the appointment was 25 minutes.  Disclaimer: This note was dictated with voice recognition software. Similar sounding words can inadvertently be transcribed and may not be corrected upon review.

## 2016-07-17 ENCOUNTER — Encounter: Payer: Self-pay | Admitting: Internal Medicine

## 2016-07-17 ENCOUNTER — Ambulatory Visit (INDEPENDENT_AMBULATORY_CARE_PROVIDER_SITE_OTHER): Payer: Medicare Other | Admitting: Internal Medicine

## 2016-07-17 DIAGNOSIS — J449 Chronic obstructive pulmonary disease, unspecified: Secondary | ICD-10-CM

## 2016-07-17 NOTE — Patient Instructions (Signed)
COPD, severe Stable COPD compared to last visit  Plan - Continue oxygen, DuoNeb and daily prednisone as before - Glad Dr. Julien Nordmann is watching your lung nodules andwill  do surveillance CT scan - High dose flu shot in the fall  Follow-up - Return to see me in 6 months or sooner if needed

## 2016-07-17 NOTE — Progress Notes (Signed)
Subjective:     Patient ID: Melanie Best, female   DOB: 02-May-1945, 71 y.o.   MRN: [id number]  HPI  #Rt renal cell cancer Aug 2010 - Dr Diona Fanti Large right renal cell carcinoma with extension into the right renal vein and inferior vena cava  #CRI  - creat 1.57m% Oct 2014  #COPD  chronic respiratory failure - O2 dependent since 2004 (CT 2010 - emphysema, CXR - per hx clear in 2013)  -- Gold stage 3-4, fev1 0.8L/34%, Ratio 40, TLC 110%, DLCO 35% In May 2013  - Normal ECHO - May 2006 - Rx Mdds include advair, spiriva, daily prednisone 125mand duoneb  -> reduced to 8m62mred 09/09/2013 - REhab since oct 2012  - Fall 2013: Not a transplant candidate - [facility] transplant team screening  #Recurent AECOPD  - multiple in early 2013 - rx by PMD  - 03/01/2012 office visit - levaquin alone, no pred - Start N. acetylcysteine April 2014   #Ex-smoker  - 35 ppd. Quit 2007   #Baseline head tremor and walks with walkerr.   # Oct/Nov 2010  had s/p right nephrectomy for renal cell cancer discovered following MRI for leg/back pain.   #Also, s/p laminectomy following that in 2011 for "pinched nerve" by Dr MarRodell Perna# Presumed stage 1A NSCLC - RUL nodule  -   In July 2013, CT showed 2.5cm spiculared RUL nodule. d PET scan 10/18/11 where Spiculated hypermetabolic right upper lobe lung nodule. This  measures 2.5 x 1.5 cm and a S.U.V. max of 6.1 on image 77.  - ENB 11/29/11: NON Diagnostic - normal resp cells - s.p empriic curative XRT ending Nov 2013 - Dec 2013: RUL mass improved 1.6cm - March 2014 : RUL mass improved 1.4cm - March 2016 - RUL nodule - down to 1.2cm  #left lung nodules  - July 2013: LUL/LLL 1.5cm and LL 6-7mm8mMarch 2014: :LUL/LLL 1.5cm and no change LLL 8mm 31m slightly worse - Feb 2015 - LUL. LLL 1.5cm and no change. LLL 9mm a31mslightly worse -> XRT ending April 2015 - June 2015:  LUL/LLL - ? Same, LLL 7mm [id number] - March 2016:  stable  ///////////////////////////////////////////////////////////////////////////////////////  OV 01/13/2015  Chief Complaint  Patient presents with  . Follow-up    Pt c/o of dyspnea with exertion. Pt is on O2/2L pulse. Pt denies cough/wheeze/CP/tightness.     Last visit 06/30/2014.  FU COPD with chronic respiratory failure- stable. Denies admission, ER visits, pred bursts,. DuoNeb with daily prednisone tablet. Since last visit she is doing DuoNeb. She gets this at [employer]. Cause of $55 for 3 months. She is wondering if she could get something cheaper. She is asking for samples. She's not interested in Spiriva or Advair. She spends most of the day watching TV and playing with a computer husband agrees with her that she has no new issues   Fu lung cancer- s/p xrt. In April 2015. Surveillance CT scan March 2016 shows stability.. Repeat surveillance CT chest October 2016 that I personally visualized shows stability but there is a new left lower lobe lung nodule that is of large size but suspects more inflammatory and repeat nature   Renal cancer.: [facility]. But she tells me that they've asked me to do the surveillance CT of the abdomen at the time she has a surveillance CT scan of the chest.  Her urologist is Dr. Steven Preston FleetingCT  in October 2016 did not show any evidence of recurrence   OV 07/09/2015  Chief Complaint  Patient presents with  . Follow-up    SOB worse with exertion about the same as last visit. Denies any wheezing, cp/tightness.    Follow-up COPD, lung cancer with lung nodules and also renal cancer surveillance, in the setting of chronic respiratory failure.   Last follow-up was in fall 2016. Since then she's been doing well. She has intentionally lost a lot of weight. She feels more energetic. She had CT scan in April 2017 nodules are stable. This of the chest. She is upcoming for a surveillance CT chest and renal  cancer  CT of the abdomen without contrast end of 2017. Urology has been during the surveillance scans for her kidney. There are no new issues. sHe is compliant with nebulizers.   OV 01/18/2016  Chief Complaint  Patient presents with  . Follow-up    Pt states breathing is good today. Pt c/o no congestion, tightness or SOB. Pt states no coughing. Pt breathing has not changed since last OV.       follow-up COPD, lung cancer with lung nodules and also renal cancer surveillance in the setting of chronic hypoxemic respiratory failure   last visit April 2017. She was supposed to have surveillance CT chest and CT abdomen in December 2017 and then return to see me. However this visit appointment was made prematurely. She is here for follow-up. She says that she is maintaining her weight loss and therefore she is more energetic. She has no new acute complaints. She continues to wear daily oxygen. She's had to have a CT chest which is supposed to be in December 2017. She is no acute complaints. She is up-to-date with her flu shot. Her husband also endorses no complaints on her behalf.  OV 07/17/2016  Chief Complaint  Patient presents with  . Follow-up    Pt states her breathing is unchanged since last OV. Pt c/o dry cough. Pt deneis chest congestion, CP/tightness, and f/c/s.     Follow-up advanced COPD with chronic hypoxemic respiratory failure in the setting of renal cancer surveillance and lung cancer with lung nodules  December 2017 she had CT scan of the chest and abdomen documented below. Because of concern of renal cancer and also lung cancer referrals were made. She tells me that urology has her on observation therapy. Review of the chart also shows that Dr. Julien Nordmann. A PET scan and shoulder lung nodules for of low uptake and has her on observation therapy. She tells me that overall she is functional at home. She does not as always. Over she does get around the house with oxygen and is able to  self-care. There is no new issues. She continues with oxygen, 4 mg daily prednisone and DuoNeb. Husband is here with her.  IMPRESSION:December 11 11 2017 CT chest and abdomen 1. Multifocal pulmonary nodules are again noted compatible with metastatic disease. Most of these are stable with the exception of a left lower lobe nodule which has increased in size in the interval. 2. Status post right nephrectomy without evidence for local tumor recurrence. 3. Multiple lesions of varying complexity are identified within the left kidney. Arising from the inferior pole of the left kidney is an intermediate attenuating lesion which may represent a hemorrhagic/proteinaceous cyst or solid mass. This is new when compared with 12/15/2014. More definitive assessment of this nodule may be obtained with a contrast enhanced MRI or CT of  the abdomen (renal mass protocol). 4. Aortic atherosclerosis.   Electronically Signed   By: Kerby Moors M.D.   On: 02/29/2016 10:34     has a past medical history of Anemia; Anxiety; Arthritis; Cancer ([place]); CKD (chronic kidney disease), stage II; COPD (chronic obstructive pulmonary disease) ([place]); Cough (08-02-2015); Diabetes mellitus, type 2 ([place]); DM (diabetes mellitus) ([place]) (01/27/2012); DVT (deep venous thrombosis), left; Dyslipidemia; Esophageal reflux; GERD (gastroesophageal reflux disease) (01/27/2012); Glaucoma; Hemoptysis (08-02-15); History of radiation therapy (01/09/12,01/11/12,01/15/12,01/17/12,& 01/19/12); HTN (hypertension) (01/27/2012); Hyperlipidemia (01/27/2012); Hypertension; Leukocytosis; Lung cancer ([place]); Osteopenia; Shortness of breath; Syncope; Tremor; Urinary incontinence; and Vitamin D deficiency.   reports that she quit smoking about 12 years ago. Her smoking use included Cigarettes. She has a 35.00 pack-year smoking history. She has never used smokeless tobacco.  Past Surgical History:  Procedure Laterality Date  . BACK SURGERY     for pinched  nerve, 2011, here at [facility]  . EYE SURGERY     cataracts removed, ?IOL  . FLEXIBLE BRONCHOSCOPY  11/29/2011  . right nephrectomy      No Known Allergies  Immunization History  Administered Date(s) Administered  . Influenza Split 11/28/2011, 12/18/2012, 12/22/2013, 12/19/2015  . Influenza Whole 11/19/2010  . Influenza-Unspecified 12/14/2014  . Pneumococcal Conjugate-13 05/14/2013  . Pneumococcal Polysaccharide-23 02/18/2004  . Td 02/24/2014    Family History  Problem Relation Age of Onset  . Emphysema Father   . Cancer Sister     behind eye  . Cancer Brother     lung  . Cancer Brother     lung  . Cancer Brother     throat     Current Outpatient Prescriptions:  .  aspirin 81 MG tablet, Take 81 mg by mouth daily., Disp: , Rfl:  .  bimatoprost (LUMIGAN) 0.01 % SOLN, Place 1 drop into both eyes at bedtime. , Disp: , Rfl:  .  Blood Glucose Monitoring Suppl (ONE TOUCH ULTRA SYSTEM KIT) w/Device KIT, 50 kits by Does not apply route once. Testing as directed, Disp: , Rfl:  .  brinzolamide (AZOPT) 1 % ophthalmic suspension, Place 1 drop into both eyes 3 (three) times daily. Reported on 08/12/2015, Disp: , Rfl:  .  Calcium Carb-Cholecalciferol (CALCIUM 500 +D) 500-400 MG-UNIT TABS, Take 1 tablet by mouth 2 times daily at 12 noon and 4 pm. , Disp: , Rfl:  .  clonazePAM (KLONOPIN) 1 MG tablet, Take 1 mg by mouth 2 (two) times daily as needed. For anxiety, Disp: , Rfl:  .  diltiazem (CARDIZEM CD) 180 MG 24 hr capsule, Take 180 mg by mouth daily., Disp: , Rfl: 0 .  ergocalciferol (VITAMIN D2) 50000 UNITS capsule, Take 50,000 Units by mouth once a week. Take on Monday, Disp: , Rfl:  .  esomeprazole (NEXIUM) 40 MG capsule, Take 40 mg by mouth daily before breakfast. , Disp: , Rfl:  .  furosemide (LASIX) 20 MG tablet, Take 20 mg by mouth daily as needed. For fluid retention, Disp: , Rfl:  .  hydrOXYzine (ATARAX/VISTARIL) 25 MG tablet, Take 25 mg by mouth every 8 (eight) hours as needed. Reported  on 08/12/2015, Disp: , Rfl:  .  ipratropium-albuterol (DUONEB) 0.5-2.5 (3) MG/3ML SOLN, Take 3 mLs by nebulization 4 (four) times daily as needed., Disp: 360 mL, Rfl: 4 .  JANUVIA 50 MG tablet, Take 50 mg by mouth daily. , Disp: , Rfl:  .  lovastatin (MEVACOR) 20 MG tablet, Take 20 mg by mouth at bedtime., Disp: , Rfl:  .  OXYGEN-HELIUM IN, Inhale into the lungs. 2 liters, Disp: , Rfl:  .  predniSONE (DELTASONE) 1 MG tablet, 4 mg every morning. Taking 4 82m  tablets  po daily, Disp: , Rfl: 0 .  primidone (MYSOLINE) 50 MG tablet, Take 50 mg by mouth at bedtime., Disp: , Rfl:  .  traMADol (ULTRAM) 50 MG tablet, Take 50 mg by mouth 3 (three) times daily as needed., Disp: , Rfl:     Review of Systems     Objective:   Physical Exam Vitals:   07/17/16 1203  BP: 116/62  Pulse: (!) 101  SpO2: 97%  Weight: 166 lb 9.6 oz (75.6 kg)  Height: '5\' 6"'  (1.676 m)    Estimated body mass index is 26.89 kg/m as calculated from the following:   Height as of this encounter: '5\' 6"'  (1.676 m).   Weight as of this encounter: 166 lb 9.6 oz (75.6 kg).  Sitting in a wheelchair Pleasant Alert and oriented 3. Speech normal. Baseline head nod with tremors present Clear to auscultation bilaterally overall expression prolonged and no wheeze. Mild barrel chest Abdomen soft. Normal heart sounds. Mild venous stasis edema baseline      Assessment:       ICD-9-CM ICD-10-CM   1. COPD, severe ([place] 437J44.9        Plan:     COPD, severe Stable COPD compared to last visit  Plan - Continue oxygen, DuoNeb and daily prednisone as before - Glad Dr. MJulien Nordmannis watching your lung nodules andwill  do surveillance CT scan - High dose flu shot in the fall  Follow-up - Return to see me in 6 months or sooner if needed    Dr. MBrand Males M.D., [facility].P Pulmonary and Critical Care Medicine Staff Physician CP[place]ulmonary and Critical Care Pager: [phone] If no answer or  between  15:00h - 7:00h: call [phone]  07/17/2016 12:24 PM

## 2016-07-17 NOTE — Assessment & Plan Note (Signed)
Stable COPD compared to last visit  Plan - Continue oxygen, DuoNeb and daily prednisone as before - Glad Dr. Julien Nordmann is watching your lung nodules andwill  do surveillance CT scan - High dose flu shot in the fall  Follow-up - Return to see me in 6 months or sooner if needed

## 2016-08-03 ENCOUNTER — Telehealth: Payer: Self-pay | Admitting: Internal Medicine

## 2016-08-03 DIAGNOSIS — J449 Chronic obstructive pulmonary disease, unspecified: Secondary | ICD-10-CM

## 2016-08-03 NOTE — Telephone Encounter (Signed)
Pt requesting an order for a small POC.  Pt currently wears 2lpm with exertion.  Uses [facility] for O2 needs.  MR ok to order POC?  Thanks!

## 2016-08-04 NOTE — Telephone Encounter (Signed)
Called and spoke with pt and she is aware that order has been placed with [facility] and they should be contacting her to set this up.

## 2016-08-04 NOTE — Telephone Encounter (Signed)
Ok for  POC  Dr. Brand Males, M.D., [facility].C.P Pulmonary and Critical Care Medicine Staff Physician [place] [facility] Pager: [phone], If no answer or between  15:00h - 7:00h: call [phone]  08/04/2016 8:23 AM

## 2016-11-06 ENCOUNTER — Telehealth: Payer: Self-pay | Admitting: Medical Oncology

## 2016-11-06 NOTE — Telephone Encounter (Signed)
Cancel appt this week due to conflict . R;s for next available.

## 2016-11-07 ENCOUNTER — Telehealth: Payer: Self-pay | Admitting: Internal Medicine

## 2016-11-07 ENCOUNTER — Other Ambulatory Visit (HOSPITAL_BASED_OUTPATIENT_CLINIC_OR_DEPARTMENT_OTHER): Payer: Medicare Other

## 2016-11-07 ENCOUNTER — Ambulatory Visit (HOSPITAL_COMMUNITY)
Admission: RE | Admit: 2016-11-07 | Discharge: 2016-11-07 | Disposition: A | Discharge status: Home / Self Care | Payer: Medicare Other | Source: Ambulatory Visit | Attending: Internal Medicine | Admitting: Internal Medicine

## 2016-11-07 DIAGNOSIS — I2510 Atherosclerotic heart disease of native coronary artery without angina pectoris: Secondary | ICD-10-CM | POA: Diagnosis not present

## 2016-11-07 DIAGNOSIS — C641 Malignant neoplasm of right kidney, except renal pelvis: Secondary | ICD-10-CM | POA: Diagnosis present

## 2016-11-07 DIAGNOSIS — R911 Solitary pulmonary nodule: Secondary | ICD-10-CM | POA: Diagnosis not present

## 2016-11-07 DIAGNOSIS — I700 Atherosclerosis of aorta: Secondary | ICD-10-CM | POA: Diagnosis not present

## 2016-11-07 DIAGNOSIS — C3432 Malignant neoplasm of lower lobe, left bronchus or lung: Secondary | ICD-10-CM

## 2016-11-07 DIAGNOSIS — J439 Emphysema, unspecified: Secondary | ICD-10-CM | POA: Insufficient documentation

## 2016-11-07 LAB — COMPREHENSIVE METABOLIC PANEL
ALBUMIN: 3.6 g/dL (ref 3.5–5.0)
ALT: 13 U/L (ref 0–55)
ANION GAP: 9 meq/L (ref 3–11)
AST: 11 U/L (ref 5–34)
Alkaline Phosphatase: 78 U/L (ref 40–150)
BUN: 18 mg/dL (ref 7.0–26.0)
CALCIUM: 9.3 mg/dL (ref 8.4–10.4)
CO2: 29 mEq/L (ref 22–29)
CREATININE: 1.1 mg/dL (ref 0.6–1.1)
Chloride: 105 mEq/L (ref 98–109)
EGFR: 58 mL/min/{1.73_m2} — ABNORMAL LOW (ref >90)
Glucose: 97 mg/dl (ref 70–140)
POTASSIUM: 3.7 meq/L (ref 3.5–5.1)
Sodium: 143 mEq/L (ref 136–145)
Total Bilirubin: <0.22 mg/dL (ref 0.20–1.20)
Total Protein: 7.2 g/dL (ref 6.4–8.3)

## 2016-11-07 LAB — CBC WITH DIFFERENTIAL/PLATELET
BASO%: 0.3 % (ref 0.0–2.0)
BASOS ABS: 0 10*3/uL (ref 0.0–0.1)
EOS%: 5.1 % (ref 0.0–7.0)
Eosinophils Absolute: 0.7 10*3/uL — ABNORMAL HIGH (ref 0.0–0.5)
HEMATOCRIT: 36.1 % (ref 34.8–46.6)
HEMOGLOBIN: 11.6 g/dL (ref 11.6–15.9)
LYMPH#: 3.5 10*3/uL — AB (ref 0.9–3.3)
LYMPH%: 26.9 % (ref 14.0–49.7)
MCH: 31.9 pg (ref 25.1–34.0)
MCHC: 32.2 g/dL (ref 31.5–36.0)
MCV: 99.2 fL (ref 79.5–101.0)
MONO#: 0.7 10*3/uL (ref 0.1–0.9)
MONO%: 5.6 % (ref 0.0–14.0)
NEUT#: 8.2 10*3/uL — ABNORMAL HIGH (ref 1.5–6.5)
NEUT%: 62.1 % (ref 38.4–76.8)
PLATELETS: 286 10*3/uL (ref 145–400)
RBC: 3.63 10*6/uL — ABNORMAL LOW (ref 3.70–5.45)
RDW: 14.3 % (ref 11.2–14.5)
WBC: 13.1 10*3/uL — ABNORMAL HIGH (ref 3.9–10.3)

## 2016-11-07 NOTE — Telephone Encounter (Signed)
Spoke with patient regarding her new f/u with Dr.Mo that was scheduled.

## 2016-11-09 ENCOUNTER — Ambulatory Visit: Payer: Medicare Other | Admitting: Internal Medicine

## 2016-11-22 ENCOUNTER — Encounter: Payer: Self-pay | Admitting: Internal Medicine

## 2016-11-22 ENCOUNTER — Ambulatory Visit (HOSPITAL_BASED_OUTPATIENT_CLINIC_OR_DEPARTMENT_OTHER): Payer: Medicare Other | Admitting: Internal Medicine

## 2016-11-22 ENCOUNTER — Telehealth: Payer: Self-pay | Admitting: Internal Medicine

## 2016-11-22 VITALS — BP 102/66 | HR 100 | Temp 98.8°F | Resp 17 | Wt 165.1 lb

## 2016-11-22 DIAGNOSIS — Z8553 Personal history of malignant neoplasm of renal pelvis: Secondary | ICD-10-CM | POA: Diagnosis not present

## 2016-11-22 DIAGNOSIS — R918 Other nonspecific abnormal finding of lung field: Secondary | ICD-10-CM

## 2016-11-22 DIAGNOSIS — C3432 Malignant neoplasm of lower lobe, left bronchus or lung: Secondary | ICD-10-CM

## 2016-11-22 DIAGNOSIS — C3411 Malignant neoplasm of upper lobe, right bronchus or lung: Secondary | ICD-10-CM

## 2016-11-22 NOTE — Progress Notes (Signed)
[place] [phone]   [phone]  OFFICE PROGRESS NOTE  [name], MD [address] [place] [address]  DIAGNOSIS:  1) Multiple bilateral pulmonary nodules 2) history of renal cell carcinoma status post right nephrectomy in September 2010.  PRIOR THERAPY: Stereotactic radiotherapy to pulmonary nodules based on the metabolic activity with no tissue diagnosis. This was performed under the care of Dr. Pablo Ledger.  CURRENT THERAPY: Observation.  INTERVAL HISTORY: Melanie Best 71 y.o. female returns to the clinic today for follow-up visit accompanied by her husband. The patient is feeling fine today was no specific complaints except for the baseline shortness breath and she is currently on home oxygen. She denied having any chest pain, cough or hemoptysis. She denied having any recent weight loss or night sweats. She has no nausea, vomiting, diarrhea or constipation. She has been observation with close monitoring of suspicious pulmonary nodules. The patient had repeat CT scan of the chest performed recently and she is here for evaluation and discussion of her scan results.   MEDICAL HISTORY: Past Medical History:  Diagnosis Date  . Anemia   . Anxiety   . Arthritis    R leg  . Cancer ([place])    kidney  . CKD (chronic kidney disease), stage II    removed R kidney- 2010- for Cancer, followed by Dr. Diona Fanti  . COPD (chronic obstructive pulmonary disease) ([facility])    uses O2-2 liters , continuously , followed by Dr. Chase Caller  . Cough 08-02-2015  . Diabetes mellitus, type 2 ([place])   . DM (diabetes mellitus) ([place]) 01/27/2012  . DVT (deep venous thrombosis), left    2011- treated /w coumadin   . Dyslipidemia   . Esophageal reflux   . GERD (gastroesophageal reflux disease) 01/27/2012  . Glaucoma   . Hemoptysis 08-02-15  . History of radiation therapy 01/09/12,01/11/12,01/15/12,01/17/12,& 01/19/12   rul lung,50Gy/20f  . HTN  (hypertension) 01/27/2012  . Hyperlipidemia 01/27/2012  . Hypertension    sees Dr. HDeforest Hoylesfor PCP, denies ever having stress or ECHO, ?when & where she had  last  EKG  . Leukocytosis   . Lung cancer ([place]    NSCLC  . Osteopenia   . Shortness of breath   . Syncope   . Tremor   . Urinary incontinence   . Vitamin D deficiency     ALLERGIES:  has No Known Allergies.  MEDICATIONS:  Current Outpatient Prescriptions  Medication Sig Dispense Refill  . aspirin 81 MG tablet Take 81 mg by mouth daily.    . bimatoprost (LUMIGAN) 0.01 % SOLN Place 1 drop into both eyes at bedtime.     . Blood Glucose Monitoring Suppl (ONE TOUCH ULTRA SYSTEM KIT) w/Device KIT 50 kits by Does not apply route once. Testing as directed    . brinzolamide (AZOPT) 1 % ophthalmic suspension Place 1 drop into both eyes 3 (three) times daily. Reported on 08/12/2015    . Calcium Carb-Cholecalciferol (CALCIUM 500 +D) 500-400 MG-UNIT TABS Take 1 tablet by mouth 2 times daily at 12 noon and 4 pm.     . clonazePAM (KLONOPIN) 1 MG tablet Take 1 mg by mouth 2 (two) times daily as needed. For anxiety    . diltiazem (CARDIZEM CD) 180 MG 24 hr capsule Take 180 mg by mouth daily.  0  . ergocalciferol (VITAMIN D2) 50000 UNITS capsule Take 50,000 Units by mouth once a week. Take on Monday    .  esomeprazole (NEXIUM) 40 MG capsule Take 40 mg by mouth daily before breakfast.     . furosemide (LASIX) 20 MG tablet Take 20 mg by mouth daily as needed. For fluid retention    . hydrOXYzine (ATARAX/VISTARIL) 25 MG tablet Take 25 mg by mouth every 8 (eight) hours as needed. Reported on 08/12/2015    . ipratropium-albuterol (DUONEB) 0.5-2.5 (3) MG/3ML SOLN Take 3 mLs by nebulization 4 (four) times daily as needed. 360 mL 4  . JANUVIA 50 MG tablet Take 50 mg by mouth daily.     Marland Kitchen lovastatin (MEVACOR) 20 MG tablet Take 20 mg by mouth at bedtime.    Donell Sievert IN Inhale into the lungs. 2 liters    . predniSONE (DELTASONE) 1 MG tablet 4 mg every  morning. Taking 4 77m  tablets  po daily  0  . primidone (MYSOLINE) 50 MG tablet Take 50 mg by mouth at bedtime.    . traMADol (ULTRAM) 50 MG tablet Take 50 mg by mouth 3 (three) times daily as needed.     No current facility-administered medications for this visit.     SURGICAL HISTORY:  Past Surgical History:  Procedure Laterality Date  . BACK SURGERY     for pinched nerve, 2011, here at [facility] . EYE SURGERY     cataracts removed, ?IOL  . FLEXIBLE BRONCHOSCOPY  11/29/2011  . right nephrectomy      REVIEW OF SYSTEMS:  A comprehensive review of systems was negative except for: Respiratory: positive for dyspnea on exertion   PHYSICAL EXAMINATION: General appearance: alert, cooperative, fatigued and no distress Head: Normocephalic, without obvious abnormality, atraumatic Neck: no adenopathy, no JVD, supple, symmetrical, trachea midline and thyroid not enlarged, symmetric, no tenderness/mass/nodules Lymph nodes: Cervical, supraclavicular, and axillary nodes normal. Resp: clear to auscultation bilaterally Back: symmetric, no curvature. ROM normal. No CVA tenderness. Cardio: regular rate and rhythm, S1, S2 normal, no murmur, click, rub or gallop GI: soft, non-tender; bowel sounds normal; no masses,  no organomegaly Extremities: extremities normal, atraumatic, no cyanosis or edema  ECOG PERFORMANCE STATUS: 1 - Symptomatic but completely ambulatory  Blood pressure 102/66, pulse 100, temperature 98.8 F (37.1 C), temperature source Oral, resp. rate 17, weight 165 lb 1.6 oz (74.9 kg), SpO2 98 %.  LABORATORY DATA: Lab Results  Component Value Date   WBC 13.1 (H) 11/07/2016   HGB 11.6 11/07/2016   HCT 36.1 11/07/2016   MCV 99.2 11/07/2016   PLT 286 11/07/2016      Chemistry      Component Value Date/Time   NA 143 11/07/2016 1042   K 3.7 11/07/2016 1042   CL 101 02/19/2012 1312   CO2 29 11/07/2016 1042   BUN 18.0 11/07/2016 1042   CREATININE 1.1 11/07/2016 1042      Component  Value Date/Time   CALCIUM 9.3 11/07/2016 1042   ALKPHOS 78 11/07/2016 1042   AST 11 11/07/2016 1042   ALT 13 11/07/2016 1042   BILITOT <0.22 11/07/2016 1042       RADIOGRAPHIC STUDIES: Ct Chest Wo Contrast  Result Date: 11/07/2016 CLINICAL DATA:  Wound followup lung cancer. EXAM: CT CHEST WITHOUT CONTRAST TECHNIQUE: Multidetector CT imaging of the chest was performed following the standard protocol without IV contrast. COMPARISON:  04/12/2016 FINDINGS: Cardiovascular: Normal heart size. No pericardial effusion. Aortic atherosclerosis. Calcification in the LAD coronary artery noted. Mediastinum/Nodes: No enlarged mediastinal or axillary lymph nodes. Thyroid gland, trachea, and esophagus demonstrate no significant findings. Lungs/Pleura: Moderate to advanced  changes of centrilobular and paraseptal emphysema. Pulmonary nodule within the right upper lobe measures 1.2 cm, image 93 series 5. Previously 0.8 cm. Pulmonary nodule in the left lower lobe measures 1.1 cm, image 104 of series 5. Previously 0.9 cm. Stable area of masslike architectural distortion within the right upper lobe in the area of postsurgical change. There is pulmonary nodule within the left lower lobe adjacent to pulmonary artery branch is stable measuring 1.1 cm. Adjacent nodule measuring 1 cm is increased from 7 mm previously. Upper Abdomen: No acute abnormality. Musculoskeletal: No aggressive lytic or sclerotic bone lesions. IMPRESSION: 1. Overall there has been a mild progression of bilateral pulmonary nodule. The compatible with metastatic disease 2. Aortic Atherosclerosis (ICD10-I70.0) and Emphysema (ICD10-J43.9). Coronary artery calcification noted. Electronically Signed   By: Kerby Moors M.D.   On: 11/07/2016 15:13    ASSESSMENT AND PLAN: This is a very pleasant 71 years old African-American female with history of renal cell carcinoma as well as bilateral pulmonary nodules suspicious for malignancy. She was treated with  stereotactic radiotherapy to hypermetabolic nodules without tissue diagnosis in the past. She continues to have suspicious pulmonary nodules. These nodules were smaller and had low hypermetabolic activity on the previous PET scan. The recent CT scan of the chest showed continuous increase in the size of the bilateral pulmonary nodule compatible with metastatic disease. I discussed the scan results with the patient and her husband and recommended for her to see interventional radiology for consideration of CT-guided core biopsy of either the right upper lobe or the left lower lobe pulmonary nodule. I will see the patient back for follow-up visit in 2-3 weeks for reevaluation and discussion of her treatment options based on the biopsy results. She was advised to call immediately if she has any concerning symptoms in the interval. The patient voices understanding of current disease status and treatment options and is in agreement with the current care plan. All questions were answered. The patient knows to call the clinic with any problems, questions or concerns. We can certainly see the patient much sooner if necessary.  I spent 10 minutes counseling the patient face to face. The total time spent in the appointment was 15 minutes.  Disclaimer: This note was dictated with voice recognition software. Similar sounding words can inadvertently be transcribed and may not be corrected upon review.

## 2016-11-22 NOTE — Telephone Encounter (Signed)
Gave patient avs report and appointments for September. Central radiology will call re bx - patient aware.

## 2016-11-29 ENCOUNTER — Other Ambulatory Visit: Payer: Self-pay | Admitting: General Surgery

## 2016-11-29 ENCOUNTER — Other Ambulatory Visit: Payer: Self-pay | Admitting: Radiology

## 2016-11-30 ENCOUNTER — Encounter (INDEPENDENT_AMBULATORY_CARE_PROVIDER_SITE_OTHER): Payer: Self-pay | Admitting: Surgery

## 2016-11-30 ENCOUNTER — Ambulatory Visit (INDEPENDENT_AMBULATORY_CARE_PROVIDER_SITE_OTHER): Payer: Self-pay | Admitting: Surgery

## 2016-11-30 ENCOUNTER — Ambulatory Visit (INDEPENDENT_AMBULATORY_CARE_PROVIDER_SITE_OTHER): Payer: Medicare Other

## 2016-11-30 ENCOUNTER — Other Ambulatory Visit: Payer: Self-pay | Admitting: Radiology

## 2016-11-30 ENCOUNTER — Ambulatory Visit (INDEPENDENT_AMBULATORY_CARE_PROVIDER_SITE_OTHER): Payer: Medicare Other | Admitting: Surgery

## 2016-11-30 DIAGNOSIS — M25512 Pain in left shoulder: Secondary | ICD-10-CM | POA: Diagnosis not present

## 2016-12-01 ENCOUNTER — Other Ambulatory Visit: Payer: Self-pay | Admitting: General Surgery

## 2016-12-01 ENCOUNTER — Other Ambulatory Visit: Payer: Self-pay | Admitting: Radiology

## 2016-12-04 ENCOUNTER — Ambulatory Visit (HOSPITAL_COMMUNITY)
Admission: RE | Admit: 2016-12-04 | Discharge: 2016-12-04 | Disposition: A | Discharge status: Home / Self Care | Payer: Medicare Other | Source: Ambulatory Visit | Attending: Interventional Radiology | Admitting: Interventional Radiology

## 2016-12-04 ENCOUNTER — Ambulatory Visit (HOSPITAL_COMMUNITY)
Admission: RE | Admit: 2016-12-04 | Discharge: 2016-12-04 | Disposition: A | Discharge status: Home / Self Care | Payer: Medicare Other | Source: Ambulatory Visit | Attending: Internal Medicine | Admitting: Internal Medicine

## 2016-12-04 ENCOUNTER — Encounter (HOSPITAL_COMMUNITY): Payer: Self-pay

## 2016-12-04 DIAGNOSIS — Z808 Family history of malignant neoplasm of other organs or systems: Secondary | ICD-10-CM | POA: Diagnosis not present

## 2016-12-04 DIAGNOSIS — J449 Chronic obstructive pulmonary disease, unspecified: Secondary | ICD-10-CM | POA: Insufficient documentation

## 2016-12-04 DIAGNOSIS — N182 Chronic kidney disease, stage 2 (mild): Secondary | ICD-10-CM | POA: Diagnosis not present

## 2016-12-04 DIAGNOSIS — Z801 Family history of malignant neoplasm of trachea, bronchus and lung: Secondary | ICD-10-CM | POA: Insufficient documentation

## 2016-12-04 DIAGNOSIS — Z9889 Other specified postprocedural states: Secondary | ICD-10-CM | POA: Diagnosis not present

## 2016-12-04 DIAGNOSIS — E1122 Type 2 diabetes mellitus with diabetic chronic kidney disease: Secondary | ICD-10-CM | POA: Diagnosis not present

## 2016-12-04 DIAGNOSIS — Z87891 Personal history of nicotine dependence: Secondary | ICD-10-CM | POA: Insufficient documentation

## 2016-12-04 DIAGNOSIS — E785 Hyperlipidemia, unspecified: Secondary | ICD-10-CM | POA: Diagnosis not present

## 2016-12-04 DIAGNOSIS — Z79891 Long term (current) use of opiate analgesic: Secondary | ICD-10-CM | POA: Diagnosis not present

## 2016-12-04 DIAGNOSIS — C3411 Malignant neoplasm of upper lobe, right bronchus or lung: Secondary | ICD-10-CM

## 2016-12-04 DIAGNOSIS — Z825 Family history of asthma and other chronic lower respiratory diseases: Secondary | ICD-10-CM | POA: Insufficient documentation

## 2016-12-04 DIAGNOSIS — E559 Vitamin D deficiency, unspecified: Secondary | ICD-10-CM | POA: Insufficient documentation

## 2016-12-04 DIAGNOSIS — H409 Unspecified glaucoma: Secondary | ICD-10-CM | POA: Diagnosis not present

## 2016-12-04 DIAGNOSIS — K219 Gastro-esophageal reflux disease without esophagitis: Secondary | ICD-10-CM | POA: Diagnosis not present

## 2016-12-04 DIAGNOSIS — I700 Atherosclerosis of aorta: Secondary | ICD-10-CM | POA: Diagnosis not present

## 2016-12-04 DIAGNOSIS — C3432 Malignant neoplasm of lower lobe, left bronchus or lung: Secondary | ICD-10-CM | POA: Diagnosis present

## 2016-12-04 DIAGNOSIS — M8580 Other specified disorders of bone density and structure, unspecified site: Secondary | ICD-10-CM | POA: Diagnosis not present

## 2016-12-04 DIAGNOSIS — Z79899 Other long term (current) drug therapy: Secondary | ICD-10-CM | POA: Insufficient documentation

## 2016-12-04 DIAGNOSIS — Z85528 Personal history of other malignant neoplasm of kidney: Secondary | ICD-10-CM | POA: Insufficient documentation

## 2016-12-04 DIAGNOSIS — C649 Malignant neoplasm of unspecified kidney, except renal pelvis: Secondary | ICD-10-CM | POA: Diagnosis not present

## 2016-12-04 DIAGNOSIS — Z800 Family history of malignant neoplasm of digestive organs: Secondary | ICD-10-CM | POA: Insufficient documentation

## 2016-12-04 DIAGNOSIS — Z7982 Long term (current) use of aspirin: Secondary | ICD-10-CM | POA: Diagnosis not present

## 2016-12-04 DIAGNOSIS — F419 Anxiety disorder, unspecified: Secondary | ICD-10-CM | POA: Insufficient documentation

## 2016-12-04 DIAGNOSIS — Z905 Acquired absence of kidney: Secondary | ICD-10-CM | POA: Insufficient documentation

## 2016-12-04 DIAGNOSIS — I129 Hypertensive chronic kidney disease with stage 1 through stage 4 chronic kidney disease, or unspecified chronic kidney disease: Secondary | ICD-10-CM | POA: Diagnosis not present

## 2016-12-04 LAB — CBC
HCT: 35.1 % — ABNORMAL LOW (ref 36.0–46.0)
Hemoglobin: 10.8 g/dL — ABNORMAL LOW (ref 12.0–15.0)
MCH: 31.2 pg (ref 26.0–34.0)
MCHC: 30.8 g/dL (ref 30.0–36.0)
MCV: 101.4 fL — AB (ref 78.0–100.0)
PLATELETS: 289 10*3/uL (ref 150–400)
RBC: 3.46 MIL/uL — AB (ref 3.87–5.11)
RDW: 13.7 % (ref 11.5–15.5)
WBC: 15.7 10*3/uL — AB (ref 4.0–10.5)

## 2016-12-04 LAB — GLUCOSE, CAPILLARY
GLUCOSE-CAPILLARY: 105 mg/dL — AB (ref 65–99)
Glucose-Capillary: 99 mg/dL (ref 65–99)

## 2016-12-04 LAB — PROTIME-INR
INR: 1
PROTHROMBIN TIME: 13.1 s (ref 11.4–15.2)

## 2016-12-04 MED ORDER — MIDAZOLAM HCL 2 MG/2ML IJ SOLN
INTRAMUSCULAR | Status: AC | PRN
  Administered 2016-12-04: 0.5 mg via INTRAVENOUS
  Administered 2016-12-04: 1 mg via INTRAVENOUS

## 2016-12-04 MED ORDER — HYDROCODONE-ACETAMINOPHEN 5-325 MG PO TABS
ORAL_TABLET | ORAL | Status: AC
  Filled 2016-12-04: qty 2

## 2016-12-04 MED ORDER — LIDOCAINE HCL (PF) 1 % IJ SOLN
INTRAMUSCULAR | Status: AC
  Filled 2016-12-04: qty 30

## 2016-12-04 MED ORDER — HYDROCODONE-ACETAMINOPHEN 5-325 MG PO TABS
1.0000 | ORAL_TABLET | ORAL | Status: DC | PRN
  Administered 2016-12-04: 2 via ORAL

## 2016-12-04 MED ORDER — FENTANYL CITRATE (PF) 100 MCG/2ML IJ SOLN
INTRAMUSCULAR | Status: AC | PRN
  Administered 2016-12-04: 25 ug via INTRAVENOUS
  Administered 2016-12-04: 50 ug via INTRAVENOUS

## 2016-12-04 MED ORDER — MIDAZOLAM HCL 2 MG/2ML IJ SOLN
INTRAMUSCULAR | Status: AC
  Filled 2016-12-04: qty 4

## 2016-12-04 MED ORDER — IPRATROPIUM-ALBUTEROL 0.5-2.5 (3) MG/3ML IN SOLN
RESPIRATORY_TRACT | Status: AC
  Filled 2016-12-04: qty 3

## 2016-12-04 MED ORDER — SODIUM CHLORIDE 0.9 % IV SOLN
INTRAVENOUS | Status: DC
  Administered 2016-12-04: 12:00:00 via INTRAVENOUS

## 2016-12-04 MED ORDER — IPRATROPIUM-ALBUTEROL 0.5-2.5 (3) MG/3ML IN SOLN
3.0000 mL | Freq: Once | RESPIRATORY_TRACT | Status: AC
  Administered 2016-12-04: 3 mL via RESPIRATORY_TRACT
  Filled 2016-12-04: qty 3

## 2016-12-04 MED ORDER — FENTANYL CITRATE (PF) 100 MCG/2ML IJ SOLN
INTRAMUSCULAR | Status: AC
  Filled 2016-12-04: qty 4

## 2016-12-04 NOTE — Sedation Documentation (Signed)
50ML bright red blood suctioned.

## 2016-12-04 NOTE — Sedation Documentation (Signed)
Patient is resting comfortably. 

## 2016-12-04 NOTE — Sedation Documentation (Signed)
Patient started to cough bright red blood. Airway intact. Alert following commands

## 2016-12-04 NOTE — Sedation Documentation (Signed)
Doctor at bedside.

## 2016-12-04 NOTE — Sedation Documentation (Signed)
Patient is resting comfortably. Continue to lay on right side.

## 2016-12-04 NOTE — Sedation Documentation (Signed)
Bandage right middle back intact no drainage.

## 2016-12-04 NOTE — Sedation Documentation (Signed)
Patient is resting comfortably. States position of comfort due to pain left shoulder.

## 2016-12-04 NOTE — Sedation Documentation (Signed)
Bandage intact no drainage present.

## 2016-12-04 NOTE — Procedures (Signed)
  Procedure:   CT RLL lung lesion core biopsy 18g x2 Preprocedure diagnosis:  Lung mets Postprocedure diagnosis:  same EBL:     minimal Complications:   Regional alveolar hemorrhage   See full dictation in [facility].  Dillard Cannon MD Main # [phone] Pager  [phone]

## 2016-12-04 NOTE — Discharge Instructions (Signed)
Needle Biopsy of the Lung, Care After °This sheet gives you information about how to care for yourself after your procedure. Your health care provider may also give you more specific instructions. If you have problems or questions, contact your health care provider. °What can I expect after the procedure? °After the procedure, it is common to have: °· Soreness, pain, and tenderness where a tissue sample was taken (biopsy site). °· A cough. °· A sore throat. ° °Follow these instructions at home: °Biopsy site care °· Follow instructions from your health care provider about when to remove the bandage that was placed on the biopsy site. °· Keep the bandage dry until it has been removed. °· Check your biopsy site every day for signs of infection. Check for: °? More redness, swelling, or pain. °? More fluid or blood. °? Warmth to the touch. °? Pus or a bad smell. °General instructions °· Rest as directed by your health care provider. Ask your health care provider what activities are safe for you. °· Do not take baths, swim, or use a hot tub until your health care provider approves. °· Take over-the-counter and prescription medicines only as told by your health care provider. °· If you have airplane travel scheduled, talk with your health care provider about when it is safe for you to travel by airplane. °· It is up to you to get the results of your procedure. Ask your health care provider, or the department that is doing the procedure, when your results will be ready. °· Keep all follow-up visits as told by your health care provider. This is important. °Contact a health care provider if: °· You have more redness, swelling, or pain around your biopsy site. °· You have more fluid or blood coming from your biopsy site. °· Your biopsy site feels warm to the touch. °· You have pus or a bad smell coming from your biopsy site. °· You have a fever. °· You have pain that does not get better with medicine. °Get help right away  if: °· You have problems breathing. °· You have chest pain. °· You cough up blood. °· You faint. °· You have a fast heart rate. °Summary °· After a needle biopsy of the lung, it is common to have a cough, a sore throat, or soreness, pain, and tenderness where a tissue sample was taken (biopsy site). °· You should check your biopsy area every day for signs of infection, including pus or a bad smell, warmth, more fluid or blood, or more redness, swelling, or pain. °· You should not take baths, swim, or use a hot tub until your health care provider approves. °· It is up to you to get the results of your procedure. Ask your health care provider, or the department that is doing the procedure, when your results will be ready. °This information is not intended to replace advice given to you by your health care provider. Make sure you discuss any questions you have with your health care provider. °Document Released: 01/01/2007 Document Revised: 01/26/2016 Document Reviewed: 01/26/2016 °Elsevier Interactive Patient Education © 2017 [employer] ° ° °

## 2016-12-04 NOTE — H&P (Signed)
Chief Complaint: Patient was seen in consultation today for right lung nodule biopsy at the request of [facility]  Referring Physician(s): Mohamed,Mohamed  Supervising Physician: Arne Cleveland  Patient Status: [name] - Out-pt  History of Present Illness: Melanie Best is a 71 y.o. female   Hx renal cell cancer Right nephrectomy 2010 Radiotherapy treatment (based on metabolic activity) to B pulmonary nodules without tissue diagnosis She is on home 02---no changes Follow up CT scan 8/21: IMPRESSION: 1. Overall there has been a mild progression of bilateral pulmonary nodule. The compatible with metastatic disease  Discussion in Oncology cancer conference [name] has requested biopsy of right lung nodule   Past Medical History:  Diagnosis Date  . Anemia   . Anxiety   . Arthritis    R leg  . Cancer ([place])    kidney  . CKD (chronic kidney disease), stage II    removed R kidney- 2010- for Cancer, followed by Dr. Diona Fanti  . COPD (chronic obstructive pulmonary disease) ([facility])    uses O2-2 liters , continuously , followed by Dr. Chase Caller  . Cough 08-02-2015  . Diabetes mellitus, type 2 ([place])   . DM (diabetes mellitus) ([place]) 01/27/2012  . DVT (deep venous thrombosis), left    2011- treated /w coumadin   . Dyslipidemia   . Esophageal reflux   . GERD (gastroesophageal reflux disease) 01/27/2012  . Glaucoma   . Hemoptysis 08-02-15  . History of radiation therapy 01/09/12,01/11/12,01/15/12,01/17/12,& 01/19/12   rul lung,50Gy/55f  . HTN (hypertension) 01/27/2012  . Hyperlipidemia 01/27/2012  . Hypertension    sees Dr. HDeforest Hoylesfor PCP, denies ever having stress or ECHO, ?when & where she had  last  EKG  . Leukocytosis   . Lung cancer [place]    NSCLC  . Osteopenia   . Shortness of breath   . Syncope   . Tremor   . Urinary incontinence   . Vitamin D deficiency     Past Surgical History:  Procedure Laterality Date  . BACK SURGERY     [facility] pinched nerve, [facility] . EYE SURGERY     cataracts removed, ?IOL  . FLEXIBLE BRONCHOSCOPY  11/29/2011  . right nephrectomy      Allergies: Patient has no known allergies.  Medications: Prior to Admission medications   Medication Sig Start Date End Date Taking? Authorizing Provider  aspirin 81 MG tablet Take 81 mg by mouth daily.   Yes [provider]  bimatoprost (LUMIGAN) 0.01 % SOLN Place 1 drop into both eyes at bedtime.    Yes [provider]  brinzolamide (AZOPT) 1 % ophthalmic suspension Place 1 drop into both eyes 3 (three) times daily. Reported on 08/12/2015   Yes [provider]  clonazePAM (KLONOPIN) 1 MG tablet Take 1 mg by mouth 2 (two) times daily as needed. For anxiety   Yes [provider]  diltiazem (CARDIZEM CD) 180 MG 24 hr capsule Take 180 mg by mouth daily. 04/14/14  Yes [provider]  ergocalciferol (VITAMIN D2) 50000 UNITS capsule Take 50,000 Units by mouth once a week. Take on Monday   Yes [provider]  esomeprazole (NEXIUM) 40 MG capsule Take 40 mg by mouth daily before breakfast.    Yes [provider]  ipratropium-albuterol (DUONEB) 0.5-2.5 (3) MG/3ML SOLN Take 3 mLs by nebulization 4 (four) times daily as needed. 01/13/15  Yes Ramaswamy, MBelva Crome MD  JANUVIA 50 MG tablet Take 50 mg by mouth daily.  06/10/13  Yes [provider]  lovastatin (MEVACOR) 20 MG tablet Take 20 mg by mouth at bedtime.   Yes [provider]  OXYGEN Inhale 2 L into the lungs continuous.   Yes [provider]  predniSONE (DELTASONE) 1 MG tablet Take 4 mg by mouth every morning. Taking 4 50m  tablets  po daily 06/15/14  Yes [provider]  primidone (MYSOLINE) 50 MG tablet Take 200 mg by mouth at bedtime.    Yes [provider]  traMADol (ULTRAM) 50 MG tablet Take 50 mg by mouth 3 (three) times daily as needed.   Yes [provider]  Blood Glucose Monitoring Suppl (ONE TOUCH ULTRA  SYSTEM KIT) w/Device KIT 50 kits by Does not apply route once. Testing as directed    [provider]  furosemide (LASIX) 20 MG tablet Take 20 mg by mouth daily as needed. For fluid retention    [provider]     Family History  Problem Relation Age of Onset  . Emphysema Father   . Cancer Sister        behind eye  . Cancer Brother        lung  . Cancer Brother        lung  . Cancer Brother        throat    Social History   Social History  . Marital status: Married    Spouse name: N/A  . Number of children: N/A  . Years of education: N/A   Social History Main Topics  . Smoking status: Former Smoker    Packs/day: 1.00    Years: 35.00    Types: Cigarettes    Quit date: 03/20/2004  . Smokeless tobacco: Never Used  . Alcohol use No  . Drug use: No  . Sexual activity: Not Asked   Other Topics Concern  . None   Social History Narrative  . None    Review of Systems: A 12 point ROS discussed and pertinent positives are indicated in the HPI above.  All other systems are negative.  Review of Systems  Constitutional: Positive for fatigue. Negative for activity change, appetite change, fever and unexpected weight change.  Respiratory: Positive for shortness of breath. Negative for cough.   Cardiovascular: Negative for chest pain.  Neurological: Negative for weakness.  Psychiatric/Behavioral: Negative for behavioral problems and confusion.    Vital Signs: BP (!) 142/78 (BP Location: Right Arm)   Pulse 89   Temp 97.7 F (36.5 C) (Oral)   Ht '5\' 5"'  (1.651 m)   Wt 165 lb (74.8 kg)   SpO2 98%   BMI 27.46 kg/m   Physical Exam  Constitutional: She is oriented to person, place, and time.  Cardiovascular: Normal rate, regular rhythm and normal heart sounds.   Pulmonary/Chest: Effort normal and breath sounds normal. She has no wheezes.  Abdominal: Soft. Bowel sounds are normal.  Musculoskeletal: Normal range of motion.  Neurological: She is alert and  oriented to person, place, and time.  Skin: Skin is warm and dry.  Psychiatric: She has a normal mood and affect. Her behavior is normal. Judgment and thought content normal.  Nursing note and vitals reviewed.   Imaging: Ct Chest Wo Contrast  Result Date: 11/07/2016 CLINICAL DATA:  Wound followup lung cancer. EXAM: CT CHEST WITHOUT CONTRAST TECHNIQUE: Multidetector CT imaging of the chest was performed following the standard protocol without IV contrast. COMPARISON:  04/12/2016 FINDINGS: Cardiovascular: Normal heart size. No pericardial effusion. Aortic  atherosclerosis. Calcification in the LAD coronary artery noted. Mediastinum/Nodes: No enlarged mediastinal or axillary lymph nodes. Thyroid gland, trachea, and esophagus demonstrate no significant findings. Lungs/Pleura: Moderate to advanced changes of centrilobular and paraseptal emphysema. Pulmonary nodule within the right upper lobe measures 1.2 cm, image 93 series 5. Previously 0.8 cm. Pulmonary nodule in the left lower lobe measures 1.1 cm, image 104 of series 5. Previously 0.9 cm. Stable area of masslike architectural distortion within the right upper lobe in the area of postsurgical change. There is pulmonary nodule within the left lower lobe adjacent to pulmonary artery branch is stable measuring 1.1 cm. Adjacent nodule measuring 1 cm is increased from 7 mm previously. Upper Abdomen: No acute abnormality. Musculoskeletal: No aggressive lytic or sclerotic bone lesions. IMPRESSION: 1. Overall there has been a mild progression of bilateral pulmonary nodule. The compatible with metastatic disease 2. Aortic Atherosclerosis (ICD10-I70.0) and Emphysema (ICD10-J43.9). Coronary artery calcification noted. Electronically Signed   By: Kerby Moors M.D.   On: 11/07/2016 15:13    Labs:  CBC:  Recent Labs  03/31/16 0854 11/07/16 1042 12/04/16 0927  WBC 11.4* 13.1* 15.7*  HGB 11.1* 11.6 10.8*  HCT 32.9* 36.1 35.1*  PLT 265 286 289     COAGS:  Recent Labs  12/04/16 0927  INR 1.00    BMP:  Recent Labs  03/31/16 0854 11/07/16 1042  NA 142 143  K 3.5 3.7  CO2 27 29  GLUCOSE 91 97  BUN 15.6 18.0  CALCIUM 9.0 9.3  CREATININE 0.9 1.1    LIVER FUNCTION TESTS:  Recent Labs  03/31/16 0854 11/07/16 1042  BILITOT 0.22 <0.22  AST 23 11  ALT 24 13  ALKPHOS 83 78  PROT 6.8 7.2  ALBUMIN 3.5 3.6    TUMOR MARKERS: No results for input(s): AFPTM, CEA, CA199, CHROMGRNA in the last 8760 hours.  Assessment and Plan:  Hx renal cell ca 2010 Known B Pulmonary nodules--previous radiotherapy Now follow up scan reveals enlarging nodules Scheduled now for right lung nodule biopsy Risks and benefits discussed with the patient including, but not limited to bleeding, hemoptysis, respiratory failure requiring intubation, infection, pneumothorax requiring chest tube placement, stroke from air embolism or even death. All of the patient's questions were answered, patient is agreeable to proceed. Consent signed and in chart.   Thank you for this interesting consult.  I greatly enjoyed meeting AGRIPINA GUYETTE and look forward to participating in their care.  A copy of this report was sent to the requesting provider on this date.  Electronically Signed: Lavonia Drafts, PA-C 12/04/2016, 10:39 AM   I spent a total of  30 Minutes   in face to face in clinical consultation, greater than 50% of which was counseling/coordinating care for right lung mass biopsy

## 2016-12-04 NOTE — Sedation Documentation (Signed)
Bandage intact no bleeding present

## 2016-12-04 NOTE — Sedation Documentation (Signed)
Patient stated denies pain from procedure and stated pain the same 9/10 left shoulder achy sore.

## 2016-12-08 ENCOUNTER — Other Ambulatory Visit: Payer: Self-pay | Admitting: Internal Medicine

## 2016-12-08 DIAGNOSIS — Z1231 Encounter for screening mammogram for malignant neoplasm of breast: Secondary | ICD-10-CM

## 2016-12-13 ENCOUNTER — Encounter: Payer: Self-pay | Admitting: Internal Medicine

## 2016-12-13 ENCOUNTER — Ambulatory Visit (HOSPITAL_BASED_OUTPATIENT_CLINIC_OR_DEPARTMENT_OTHER): Payer: Medicare Other | Admitting: Internal Medicine

## 2016-12-13 ENCOUNTER — Telehealth: Payer: Self-pay | Admitting: Internal Medicine

## 2016-12-13 DIAGNOSIS — R5382 Chronic fatigue, unspecified: Secondary | ICD-10-CM

## 2016-12-13 DIAGNOSIS — R0602 Shortness of breath: Secondary | ICD-10-CM | POA: Diagnosis not present

## 2016-12-13 DIAGNOSIS — C641 Malignant neoplasm of right kidney, except renal pelvis: Secondary | ICD-10-CM

## 2016-12-13 DIAGNOSIS — C7801 Secondary malignant neoplasm of right lung: Secondary | ICD-10-CM | POA: Diagnosis not present

## 2016-12-13 DIAGNOSIS — Z7189 Other specified counseling: Secondary | ICD-10-CM

## 2016-12-13 DIAGNOSIS — Z5112 Encounter for antineoplastic immunotherapy: Secondary | ICD-10-CM

## 2016-12-13 HISTORY — DX: Malignant neoplasm of right kidney, except renal pelvis: C64.1

## 2016-12-13 HISTORY — DX: Other specified counseling: Z71.89

## 2016-12-13 HISTORY — DX: Chronic fatigue, unspecified: R53.82

## 2016-12-13 NOTE — Progress Notes (Signed)
START ON PATHWAY REGIMEN - Renal Cell     A cycle is every 21 days:     Nivolumab      Ipilimumab    A cycle is every 28 days:     Nivolumab   **Always confirm dose/schedule in your pharmacy ordering system**    Patient Characteristics: Metastatic, Clear Cell, First Line, Intermediate or Poor Risk AJCC M Category: M1 AJCC 8 Stage Grouping: IV Current evidence of distant metastases<= Yes AJCC T Category: T2 AJCC N Category: N0 Does patient have oligometastatic disease<= No Would you be surprised if this patient died  in the next year<= I would NOT be surprised if this patient died in the next year Histology: Clear Cell Line of therapy: First Line Risk Status: Intermediate Risk Intent of Therapy: Non-Curative / Palliative Intent, Discussed with Patient

## 2016-12-13 NOTE — Progress Notes (Signed)
[place] Telephone:([phone]) [phone]   Fax:(336) [phone]  OFFICE PROGRESS NOTE  [name], MD [address] [place] [address]  DIAGNOSIS:  1) Metastatic renal cell carcinoma, clear cell type, WHO nuclear grade 3 initially diagnosed in September 2010 and recently presented with bilateral pulmonary nodules. 2) history of renal cell carcinoma status post right nephrectomy in September 2010.  PRIOR THERAPY: Stereotactic radiotherapy to pulmonary nodules based on the metabolic activity with no tissue diagnosis. This was performed under the care of Dr. Pablo Ledger.  CURRENT THERAPY: first line treatment with immunotherapy with Nivolumab 3 MG/KG and Ipilumumab 1 mg/Kg every 3 weeks for 4 cycles followed by maintenance Nivolumab every 3 weeks. First dose 12/27/2016  INTERVAL HISTORY: Melanie Best 71 y.o. female returns to the clinic today for follow-up visit accompanied by her husband.he patient is feeling fine today with no specific complaints except for the baseline shortness of breath. She denied having any chest pain, cough or hemoptysis. She denied having any recent weight loss or night sweats. She has no nausea, vomiting, diarrhea or constipation. She had a recent CT-guided core biopsy of right pulmonary nodule by interventional radiology and the final pathology ([id number]) was consistent with metastatic renal cell carcinoma, clear-cell thyroid, WHO nuclear grade 3. The patient is here today for evaluation and discussion of her treatment options based on the recent pathology report.    MEDICAL HISTORY: Past Medical History:  Diagnosis Date  . Anemia   . Anxiety   . Arthritis    R leg  . Cancer ([place])    kidney  . CKD (chronic kidney disease), stage II    removed R kidney- 2010- for Cancer, followed by Dr. Diona Fanti  . COPD (chronic obstructive pulmonary disease) (HCC)    uses O2-2 liters , continuously , followed by Dr. Chase Caller  . Cough  08-02-2015  . Diabetes mellitus, type 2 ([place])   . DM (diabetes mellitus) ([place]) 01/27/2012  . DVT (deep venous thrombosis), left    2011- treated /w coumadin   . Dyslipidemia   . Esophageal reflux   . GERD (gastroesophageal reflux disease) 01/27/2012  . Glaucoma   . Hemoptysis 08-02-15  . History of radiation therapy 01/09/12,01/11/12,01/15/12,01/17/12,& 01/19/12   rul lung,50Gy/49f  . HTN (hypertension) 01/27/2012  . Hyperlipidemia 01/27/2012  . Hypertension    sees Dr. HDeforest Hoylesfor PCP, denies ever having stress or ECHO, ?when & where she had  last  EKG  . Leukocytosis   . Lung cancer ([place]    NSCLC  . Osteopenia   . Shortness of breath   . Syncope   . Tremor   . Urinary incontinence   . Vitamin D deficiency     ALLERGIES:  has No Known Allergies.  MEDICATIONS:  Current Outpatient Prescriptions  Medication Sig Dispense Refill  . aspirin 81 MG tablet Take 81 mg by mouth daily.    . bimatoprost (LUMIGAN) 0.01 % SOLN Place 1 drop into both eyes at bedtime.     . Blood Glucose Monitoring Suppl (ONE TOUCH ULTRA SYSTEM KIT) w/Device KIT 50 kits by Does not apply route once. Testing as directed    . brinzolamide (AZOPT) 1 % ophthalmic suspension Place 1 drop into both eyes 3 (three) times daily. Reported on 08/12/2015    . clonazePAM (KLONOPIN) 1 MG tablet Take 1 mg by mouth 2 (two) times daily as needed. For anxiety    . diltiazem (CARDIZEM CD) 180 MG 24  hr capsule Take 180 mg by mouth daily.  0  . ergocalciferol (VITAMIN D2) 50000 UNITS capsule Take 50,000 Units by mouth once a week. Take on Monday    . esomeprazole (NEXIUM) 40 MG capsule Take 40 mg by mouth daily before breakfast.     . furosemide (LASIX) 20 MG tablet Take 20 mg by mouth daily as needed. For fluid retention    . ipratropium-albuterol (DUONEB) 0.5-2.5 (3) MG/3ML SOLN Take 3 mLs by nebulization 4 (four) times daily as needed. 360 mL 4  . JANUVIA 50 MG tablet Take 50 mg by mouth daily.     Marland Kitchen lovastatin (MEVACOR) 20 MG  tablet Take 20 mg by mouth at bedtime.    . OXYGEN Inhale 2 L into the lungs continuous.    . predniSONE (DELTASONE) 1 MG tablet Take 4 mg by mouth every morning. Taking 4 45m  tablets  po daily  0  . primidone (MYSOLINE) 50 MG tablet Take 200 mg by mouth at bedtime.     . traMADol (ULTRAM) 50 MG tablet Take 50 mg by mouth 3 (three) times daily as needed.     No current facility-administered medications for this visit.     SURGICAL HISTORY:  Past Surgical History:  Procedure Laterality Date  . BACK SURGERY     [facility] EYE SURGERY     cataracts removed, ?IOL  . FLEXIBLE BRONCHOSCOPY  11/29/2011  . right nephrectomy      REVIEW OF SYSTEMS:  Constitutional: positive for fatigue Eyes: negative Ears, nose, mouth, throat, and face: negative Respiratory: positive for dyspnea on exertion Cardiovascular: negative Gastrointestinal: negative Genitourinary:negative Integument/breast: negative Hematologic/lymphatic: negative Musculoskeletal:negative Neurological: negative Behavioral/Psych: negative Endocrine: negative Allergic/Immunologic: negative   PHYSICAL EXAMINATION: General appearance: alert, cooperative, fatigued and no distress Head: Normocephalic, without obvious abnormality, atraumatic Neck: no adenopathy, no JVD, supple, symmetrical, trachea midline and thyroid not enlarged, symmetric, no tenderness/mass/nodules Lymph nodes: Cervical, supraclavicular, and axillary nodes normal. Resp: clear to auscultation bilaterally Back: symmetric, no curvature. ROM normal. No CVA tenderness. Cardio: regular rate and rhythm, S1, S2 normal, no murmur, click, rub or gallop GI: soft, non-tender; bowel sounds normal; no masses,  no organomegaly Extremities: extremities normal, atraumatic, no cyanosis or edema Neurologic: Alert and oriented X 3, normal strength and tone. Normal symmetric reflexes. Normal coordination and gait  ECOG PERFORMANCE STATUS: 1 -  Symptomatic but completely ambulatory  Blood pressure 123/67, pulse 89, temperature 98.2 F (36.8 C), temperature source Oral, resp. rate 20, height _0  (1.651 m), weight 160 lb (72.6 kg), SpO2 98 %.  LABORATORY DATA: Lab Results  Component Value Date   WBC 15.7 (H) 12/04/2016   HGB 10.8 (L) 12/04/2016   HCT 35.1 (L) 12/04/2016   MCV 101.4 (H) 12/04/2016   PLT 289 12/04/2016      Chemistry      Component Value Date/Time   NA 143 11/07/2016 1042   K 3.7 11/07/2016 1042   CL 101 02/19/2012 1312   CO2 29 11/07/2016 1042   BUN 18.0 11/07/2016 1042   CREATININE 1.1 11/07/2016 1042      Component Value Date/Time   CALCIUM 9.3 11/07/2016 1042   ALKPHOS 78 11/07/2016 1042   AST 11 11/07/2016 1042   ALT 13 11/07/2016 1042   BILITOT <0.22 11/07/2016 1042       RADIOGRAPHIC STUDIES: Ct Biopsy  Result Date: 92018/09/22CLINICAL DATA:  History renal cell carcinoma, resected. Enlarging lung nodules. EXAM:  CT GUIDED CORE BIOPSY OF RIGHT LOWER LOBE NODULE ANESTHESIA/SEDATION: Intravenous Fentanyl and Versed were administered as conscious sedation during continuous monitoring of the patient's level of consciousness and physiological / cardiorespiratory status by the radiology RN, with a total moderate sedation time of 20 minutes. PROCEDURE: The procedure risks, benefits, and alternatives were explained to the patient. Questions regarding the procedure were encouraged and answered. The patient understands and consents to the procedure. Patient placed in right lateral decubitus position. Select axial scans obtained. The right lower lobe nodule was localized and an appropriate skin entry site was determined and marked. The operative field was prepped with chlorhexidinein a sterile fashion, and a sterile drape was applied covering the operative field. A sterile gown and sterile gloves were used for the procedure. Local anesthesia was provided with 1% Lidocaine. Under CT fluoroscopic guidance, a 17  gauge trocar needle was advanced to the margin of the lesion. Once needle tip position was confirmed, coaxial 18-gauge core biopsy samples were obtained, submitted in formalin to surgical pathology. The guide needle was removed. Postprocedure scan shows no pneumothorax. Minimal regional alveolar hemorrhage. Mild self-limited postprocedure hemoptysis with stable oxygenation and vital signs. COMPLICATIONS: Mild postprocedure hemoptysis, self-limited. SIR level A: No therapy, no consequence. FINDINGS: Right lower lobe nodule was localized. Representative core biopsy samples obtained as above. IMPRESSION: 1. Technically successful CT-guided core biopsy, right lower lobe lung nodule. Observation and follow-up chest radiography is scheduled. Electronically Signed   By: Lucrezia Europe M.D.   On: 12/06/2016 08:38   Dg Chest Port 1 View  Result Date: 12/04/2016 CLINICAL DATA:  Status post right lung biopsy.  Shortness of breath. EXAM: PORTABLE CHEST 1 VIEW COMPARISON:  Chest CT November 07, 2016 FINDINGS: There is airspace opacity throughout portions of the right lower lobe, likely post biopsy hemorrhage. There is scarring in the right upper lobe and left mid lung regions. No pneumothorax is evident. There is underlying bullous emphysematous change, primarily in the upper lobes. Heart size is normal. Pulmonary vascularity reflects underlying emphysematous change. There is aortic atherosclerosis. No evident bone lesions. IMPRESSION: No pneumothorax. Probable hemorrhage post biopsy right lower lobe. Scarring left mid lung and right upper lobe. Underlying emphysematous change. Aortic atherosclerosis. Aortic Atherosclerosis (ICD10-I70.0). Electronically Signed   By: Lowella Grip III M.D.   On: 12/04/2016 14:03    ASSESSMENT AND PLAN: This is a very pleasant 71 years old African-American female with metastatic renal cell carcinoma that was initially diagnosed in 2010 and now presenting with bilateral pulmonary nodules. She  was treated with stereotactic radiotherapy to hypermetabolic nodules without tissue diagnosis in the past. Recent imaging studies showed hypermetabolic and enlarging bilateral pulmonary nodules. CT-guided core biopsy of right pulmonary nodule was consistent with metastatic renal cell carcinoma. I had a lengthy discussion with the patient and her husband today about her current disease stage, prognosis and treatment options. I discussed with the patient the goals of care. I gave her the option of palliative care and hospice referral versus consideration of treatment with systemic immunotherapy with Nivolumab 3 MG/KG and Ipilumumab1 MG/KG every 3 weeks for 4 cycles followed by maintenance Nivolumab. I discussed with the patient adverse effect of the immunotherapy including but not limited to immunotherapy mediated skin rash, diarrhea, or inflammation of the lung, kidney, liver, thyroid or other endocrine dysfunction including type 1 diabetes mellitus. The patient would like to proceed with treatment as planned and she is expected to start the first cycle of this treatment on 12/27/2016.  I  will arrange for the patient to have a chemotherapy education class before starting the first dose of her treatment. The patient would come back for follow-up visit in 2 weeks. She was advised to call immediately if she has any concerning symptoms in the interval. The patient voices understanding of current disease status and treatment options and is in agreement with the current care plan. All questions were answered. The patient knows to call the clinic with any problems, questions or concerns. We can certainly see the patient much sooner if necessary.  Disclaimer: This note was dictated with voice recognition software. Similar sounding words can inadvertently be transcribed and may not be corrected upon review.

## 2016-12-13 NOTE — Telephone Encounter (Signed)
Gave avs and calendar for October - December

## 2016-12-19 ENCOUNTER — Other Ambulatory Visit: Payer: Medicare Other

## 2016-12-19 ENCOUNTER — Encounter: Payer: Self-pay | Admitting: *Deleted

## 2016-12-19 ENCOUNTER — Other Ambulatory Visit (HOSPITAL_BASED_OUTPATIENT_CLINIC_OR_DEPARTMENT_OTHER): Payer: Medicare Other

## 2016-12-19 DIAGNOSIS — Z79899 Other long term (current) drug therapy: Secondary | ICD-10-CM | POA: Diagnosis not present

## 2016-12-19 DIAGNOSIS — C641 Malignant neoplasm of right kidney, except renal pelvis: Secondary | ICD-10-CM

## 2016-12-19 DIAGNOSIS — R5382 Chronic fatigue, unspecified: Secondary | ICD-10-CM

## 2016-12-19 DIAGNOSIS — C7801 Secondary malignant neoplasm of right lung: Secondary | ICD-10-CM

## 2016-12-19 LAB — CBC WITH DIFFERENTIAL/PLATELET
BASO%: 0.7 % (ref 0.0–2.0)
BASOS ABS: 0.1 10*3/uL (ref 0.0–0.1)
EOS%: 1.4 % (ref 0.0–7.0)
Eosinophils Absolute: 0.2 10*3/uL (ref 0.0–0.5)
HCT: 34.5 % — ABNORMAL LOW (ref 34.8–46.6)
HEMOGLOBIN: 11.2 g/dL — AB (ref 11.6–15.9)
LYMPH%: 13.8 % — ABNORMAL LOW (ref 14.0–49.7)
MCH: 32.3 pg (ref 25.1–34.0)
MCHC: 32.6 g/dL (ref 31.5–36.0)
MCV: 99 fL (ref 79.5–101.0)
MONO#: 0.5 10*3/uL (ref 0.1–0.9)
MONO%: 3.5 % (ref 0.0–14.0)
NEUT#: 12 10*3/uL — ABNORMAL HIGH (ref 1.5–6.5)
NEUT%: 80.6 % — ABNORMAL HIGH (ref 38.4–76.8)
Platelets: 240 10*3/uL (ref 145–400)
RBC: 3.48 10*6/uL — ABNORMAL LOW (ref 3.70–5.45)
RDW: 14.4 % (ref 11.2–14.5)
WBC: 14.9 10*3/uL — ABNORMAL HIGH (ref 3.9–10.3)
lymph#: 2.1 10*3/uL (ref 0.9–3.3)

## 2016-12-19 LAB — COMPREHENSIVE METABOLIC PANEL
ALT: 24 U/L (ref 0–55)
AST: 13 U/L (ref 5–34)
Albumin: 3.4 g/dL — ABNORMAL LOW (ref 3.5–5.0)
Alkaline Phosphatase: 84 U/L (ref 40–150)
Anion Gap: 10 mEq/L (ref 3–11)
BUN: 17.3 mg/dL (ref 7.0–26.0)
CO2: 27 meq/L (ref 22–29)
Calcium: 9.2 mg/dL (ref 8.4–10.4)
Chloride: 104 mEq/L (ref 98–109)
Creatinine: 1 mg/dL (ref 0.6–1.1)
EGFR: 65 mL/min/{1.73_m2} — AB (ref >90)
GLUCOSE: 109 mg/dL (ref 70–140)
Potassium: 3.7 mEq/L (ref 3.5–5.1)
SODIUM: 142 meq/L (ref 136–145)
TOTAL PROTEIN: 6.9 g/dL (ref 6.4–8.3)

## 2016-12-19 LAB — TSH: TSH: 1.473 m[IU]/L (ref 0.308–3.960)

## 2016-12-20 ENCOUNTER — Encounter: Payer: Self-pay | Admitting: Internal Medicine

## 2016-12-20 NOTE — Progress Notes (Signed)
Called pt to introduce myself as her Arboriculturist and to discuss copay assistance.  Pt gave me consent to apply in her behalf so I applied online w/ the [employer] and was approved for $6,500 for 12 months from 12/20/16 with a 6 month look back period.  Emailed copies of approval letter and POE to Ledora Bottcher and [place] in billing and to HIM to scan in pt's chart.  Pt was also approved for the $400 [place].  I will give her an expense sheet at her next visit.

## 2016-12-22 ENCOUNTER — Telehealth: Payer: Self-pay | Admitting: Internal Medicine

## 2016-12-22 NOTE — Telephone Encounter (Signed)
Patient had came in and requested that her 10/10 appt for md and infusion be moved closer together. I called her back and left a message informing her that we are unable to move the appts closer to each other . Left message giving her the option to do lab and md visit 10/9 and keep treatment on 10/10.

## 2016-12-25 ENCOUNTER — Other Ambulatory Visit: Payer: Self-pay | Admitting: Medical Oncology

## 2016-12-25 ENCOUNTER — Other Ambulatory Visit: Payer: Self-pay | Admitting: Internal Medicine

## 2016-12-25 DIAGNOSIS — C3432 Malignant neoplasm of lower lobe, left bronchus or lung: Secondary | ICD-10-CM

## 2016-12-26 ENCOUNTER — Ambulatory Visit (HOSPITAL_BASED_OUTPATIENT_CLINIC_OR_DEPARTMENT_OTHER): Payer: Medicare Other | Admitting: Oncology

## 2016-12-26 ENCOUNTER — Telehealth: Payer: Self-pay | Admitting: Oncology

## 2016-12-26 ENCOUNTER — Encounter: Payer: Self-pay | Admitting: Oncology

## 2016-12-26 ENCOUNTER — Other Ambulatory Visit (HOSPITAL_BASED_OUTPATIENT_CLINIC_OR_DEPARTMENT_OTHER): Payer: Medicare Other

## 2016-12-26 VITALS — BP 124/69 | HR 100 | Temp 98.1°F | Resp 18 | Ht 65.0 in | Wt 164.8 lb

## 2016-12-26 DIAGNOSIS — C641 Malignant neoplasm of right kidney, except renal pelvis: Secondary | ICD-10-CM

## 2016-12-26 DIAGNOSIS — Z5112 Encounter for antineoplastic immunotherapy: Secondary | ICD-10-CM

## 2016-12-26 DIAGNOSIS — C7801 Secondary malignant neoplasm of right lung: Secondary | ICD-10-CM | POA: Diagnosis not present

## 2016-12-26 DIAGNOSIS — C3432 Malignant neoplasm of lower lobe, left bronchus or lung: Secondary | ICD-10-CM

## 2016-12-26 DIAGNOSIS — C649 Malignant neoplasm of unspecified kidney, except renal pelvis: Secondary | ICD-10-CM

## 2016-12-26 LAB — COMPREHENSIVE METABOLIC PANEL
ALK PHOS: 83 U/L (ref 40–150)
ALT: 15 U/L (ref 0–55)
ANION GAP: 10 meq/L (ref 3–11)
AST: 11 U/L (ref 5–34)
Albumin: 3.3 g/dL — ABNORMAL LOW (ref 3.5–5.0)
BUN: 15.3 mg/dL (ref 7.0–26.0)
CALCIUM: 9.3 mg/dL (ref 8.4–10.4)
CHLORIDE: 102 meq/L (ref 98–109)
CO2: 30 mEq/L — ABNORMAL HIGH (ref 22–29)
Creatinine: 1.1 mg/dL (ref 0.6–1.1)
EGFR: 60 mL/min/{1.73_m2} — ABNORMAL LOW (ref >90)
Glucose: 112 mg/dl (ref 70–140)
POTASSIUM: 4 meq/L (ref 3.5–5.1)
Sodium: 142 mEq/L (ref 136–145)
Total Bilirubin: <0.22 mg/dL (ref 0.20–1.20)
Total Protein: 6.8 g/dL (ref 6.4–8.3)

## 2016-12-26 LAB — CBC WITH DIFFERENTIAL/PLATELET
BASO%: 0.4 % (ref 0.0–2.0)
Basophils Absolute: 0 10*3/uL (ref 0.0–0.1)
EOS ABS: 0.2 10*3/uL (ref 0.0–0.5)
EOS%: 2 % (ref 0.0–7.0)
HEMATOCRIT: 33.3 % — AB (ref 34.8–46.6)
HGB: 10.7 g/dL — ABNORMAL LOW (ref 11.6–15.9)
LYMPH%: 17.3 % (ref 14.0–49.7)
MCH: 32.1 pg (ref 25.1–34.0)
MCHC: 32.3 g/dL (ref 31.5–36.0)
MCV: 99.5 fL (ref 79.5–101.0)
MONO#: 0.4 10*3/uL (ref 0.1–0.9)
MONO%: 3.5 % (ref 0.0–14.0)
NEUT#: 8.7 10*3/uL — ABNORMAL HIGH (ref 1.5–6.5)
NEUT%: 76.8 % (ref 38.4–76.8)
PLATELETS: 209 10*3/uL (ref 145–400)
RBC: 3.35 10*6/uL — ABNORMAL LOW (ref 3.70–5.45)
RDW: 14 % (ref 11.2–14.5)
WBC: 11.3 10*3/uL — ABNORMAL HIGH (ref 3.9–10.3)
lymph#: 1.9 10*3/uL (ref 0.9–3.3)

## 2016-12-26 NOTE — Progress Notes (Signed)
[facility] Follow up:    Melanie Low, MD [address] [place] [address]   DIAGNOSIS:  1) Metastatic renal cell carcinoma, clear cell type, WHO nuclear grade 3 initially diagnosed in September 2010 and recently presented with bilateral pulmonary nodules. 2) history of renal cell carcinoma status post right nephrectomy in September 2010.  SUMMARY OF ONCOLOGIC HISTORY:   Malignant neoplasm of upper lobe of right lung ([place])   09/09/2013 Initial Diagnosis    Malignant neoplasm of upper lobe of right lung      PRIOR THERAPY: Stereotactic radiotherapy to pulmonary nodules based on the metabolic activity with no tissue diagnosis. This was performed under the care of Dr. Pablo Ledger.  CURRENT THERAPY: first line treatment with immunotherapy with Nivolumab 3 MG/KG and Ipilumumab 1 mg/Kg every 3 weeks for 4 cycles followed by maintenance Nivolumab every 3 weeks. First dose 12/27/2016  INTERVAL HISTORY: Melanie Best 71 y.o. female returns for routine follow-up with her husband. The patient is feeling well today with no specific complaints except for her baseline shortness of breath. The patient continues to wear oxygen at 2 L/m. She denies having any chest pain, cough, hemoptysis. He has been good and she is gaining some weight. She denies nausea, vomiting, constipation, diarrhea. The patient is here for evaluation prior to cycle 1 of her immunotherapy.   Patient Active Problem List   Diagnosis Date Noted  . Renal cell carcinoma of right kidney ([place]) 12/13/2016  . Chronic fatigue 12/13/2016  . Goals of care, counseling/discussion 12/13/2016  . Encounter for antineoplastic immunotherapy 12/13/2016  . Malignant neoplasm of left lung ([place]) 01/13/2015  . Renal insufficiency 06/30/2014  . Renal cell carcinoma ([place]) 06/30/2014  . COPD, severe ([place]) 09/13/2013  . Malignant neoplasm of lower lobe of left lung ([place]) 09/09/2013  . Malignant neoplasm of  upper lobe of right lung ([place]) 09/09/2013  . Lung nodule, left upper lobe and left lower lobe 03/16/2012  . COPD exacerbation ([place]) 03/16/2012  . Chest pain 01/27/2012  . Leukocytosis 01/27/2012  . DM (diabetes mellitus) ([place]) 01/27/2012  . Hyperlipidemia 01/27/2012  . CKD (chronic kidney disease), stage II 01/27/2012  . HTN (hypertension) 01/27/2012  . Anxiety 01/27/2012  . GERD (gastroesophageal reflux disease) 01/27/2012  . RUL NSCLC Prsumed STage 1A 10/13/2011  . COPD, very severe ([place]) 08/05/2011    has No Known Allergies.  MEDICAL HISTORY: Past Medical History:  Diagnosis Date  . Anemia   . Anxiety   . Arthritis    R leg  . Cancer ([place])    kidney  . Chronic fatigue 12/13/2016  . CKD (chronic kidney disease), stage II    removed R kidney- 2010- for Cancer, followed by Dr. Diona Fanti  . COPD (chronic obstructive pulmonary disease) (HCC)    uses O2-2 liters , continuously , followed by Dr. Chase Caller  . Cough 08-02-2015  . Diabetes mellitus, type 2 ([place])   . DM (diabetes mellitus) ([place]) 01/27/2012  . DVT (deep venous thrombosis), left    2011- treated /w coumadin   . Dyslipidemia   . Esophageal reflux   . GERD (gastroesophageal reflux disease) 01/27/2012  . Glaucoma   . Goals of care, counseling/discussion 12/13/2016  . Hemoptysis 08-02-15  . History of radiation therapy 01/09/12,01/11/12,01/15/12,01/17/12,& 01/19/12   rul lung,50Gy/86fx  . HTN (hypertension) 01/27/2012  . Hyperlipidemia 01/27/2012  . Hypertension    sees Dr. Deforest Hoyles for PCP, denies ever having stress or ECHO, ?when & where she  had  last  EKG  . Leukocytosis   . Lung cancer ([place])    NSCLC  . Osteopenia   . Renal cell carcinoma of right kidney ([place]) 12/13/2016  . Shortness of breath   . Syncope   . Tremor   . Urinary incontinence   . Vitamin D deficiency     SURGICAL HISTORY: Past Surgical History:  Procedure Laterality Date  . BACK SURGERY     for pinched nerve, 2011, here at [facility]  . EYE SURGERY      cataracts removed, ?IOL  . FLEXIBLE BRONCHOSCOPY  11/29/2011  . right nephrectomy      SOCIAL HISTORY: Social History   Social History  . Marital status: Married    Spouse name: N/A  . Number of children: N/A  . Years of education: N/A   Occupational History  . Not on file.   Social History Main Topics  . Smoking status: Former Smoker    Packs/day: 1.00    Years: 35.00    Types: Cigarettes    Quit date: 03/20/2004  . Smokeless tobacco: Never Used  . Alcohol use No  . Drug use: No  . Sexual activity: Not on file   Other Topics Concern  . Not on file   Social History Narrative  . No narrative on file    FAMILY HISTORY: Family History  Problem Relation Age of Onset  . Emphysema Father   . Cancer Sister        behind eye  . Cancer Brother        lung  . Cancer Brother        lung  . Cancer Brother        throat    Review of Systems  Constitutional: Negative.   HENT:  Negative.   Eyes: Negative.   Respiratory: Negative for cough and hemoptysis.        She has her baseline shortness of breath.  Cardiovascular: Negative.   Gastrointestinal: Negative.   Genitourinary: Negative.    Musculoskeletal: Negative.   Skin: Negative.   Neurological: Negative.   Hematological: Negative.   Psychiatric/Behavioral: Negative.       PHYSICAL EXAMINATION  ECOG PERFORMANCE STATUS: 1 - Symptomatic but completely ambulatory  Vitals:   12/26/16 0848  BP: 124/69  Pulse: 100  Resp: 18  Temp: 98.1 F (36.7 C)  SpO2: 94%    Physical Exam  Constitutional: She is oriented to person, place, and time and well-developed, well-nourished, and in no distress. No distress.  HENT:  Head: Normocephalic.  Mouth/Throat: Oropharynx is clear and moist. No oropharyngeal exudate.  Eyes: Conjunctivae are normal. Right eye exhibits no discharge. Left eye exhibits no discharge. No scleral icterus.  Neck: Normal range of motion. Neck supple.  Cardiovascular: Normal rate, regular  rhythm, normal heart sounds and intact distal pulses.   Pulmonary/Chest: Breath sounds normal. No respiratory distress. She has no wheezes. She has no rales.  Abdominal: Soft. Bowel sounds are normal. She exhibits no distension and no mass. There is no tenderness.  Musculoskeletal: Normal range of motion. She exhibits no edema.  Lymphadenopathy:    She has no cervical adenopathy.  Neurological: She is alert and oriented to person, place, and time. She exhibits normal muscle tone. Gait normal. Coordination normal.  Skin: Skin is warm and dry. No rash noted. She is not diaphoretic. No erythema. No pallor.  Psychiatric: Mood, memory, affect and judgment normal.  Vitals reviewed.   LABORATORY DATA:  CBC  Component Value Date/Time   WBC 11.3 (H) 12/26/2016 0828   WBC 15.7 (H) 12/04/2016 0927   RBC 3.35 (L) 12/26/2016 0828   RBC 3.46 (L) 12/04/2016 0927   HGB 10.7 (L) 12/26/2016 0828   HCT 33.3 (L) 12/26/2016 0828   PLT 209 12/26/2016 0828   MCV 99.5 12/26/2016 0828   MCH 32.1 12/26/2016 0828   MCH 31.2 12/04/2016 0927   MCHC 32.3 12/26/2016 0828   MCHC 30.8 12/04/2016 0927   RDW 14.0 12/26/2016 0828   LYMPHSABS 1.9 12/26/2016 0828   MONOABS 0.4 12/26/2016 0828   EOSABS 0.2 12/26/2016 0828   BASOSABS 0.0 12/26/2016 0828    CMP     Component Value Date/Time   NA 142 12/26/2016 0828   K 4.0 12/26/2016 0828   CL 101 02/19/2012 1312   CO2 30 (H) 12/26/2016 0828   GLUCOSE 112 12/26/2016 0828   BUN 15.3 12/26/2016 0828   CREATININE 1.1 12/26/2016 0828   CALCIUM 9.3 12/26/2016 0828   PROT 6.8 12/26/2016 0828   ALBUMIN 3.3 (L) 12/26/2016 0828   AST 11 12/26/2016 0828   ALT 15 12/26/2016 0828   ALKPHOS 83 12/26/2016 0828   BILITOT <0.22 12/26/2016 0828   GFRNONAA 47 (L) 01/28/2012 0410   GFRAA 54 (L) 01/28/2012 0410    RADIOGRAPHIC STUDIES:  No results found.  ASSESSMENT and THERAPY PLAN:   Renal cell carcinoma This is a very pleasant 71 year old African-American  female with metastatic renal cell carcinoma that was initially diagnosed in 2010 and now presenting with bilateral pulmonary nodules. She was treated with stereotactic radiotherapy to hypermetabolic nodules without tissue diagnosis in the past. Recent imaging studies showed hypermetabolic and enlarging bilateral pulmonary nodules. CT-guided core biopsy of right pulmonary nodule was consistent with metastatic renal cell carcinoma. The patient is scheduled to begin systemic immunotherapy with Nivolumab 3 MG/KG and Ipilumumab1 MG/KG every 3 weeks for 4 cycles followed by maintenance Nivolumab 12/27/2016. I have again reviewed the adverse effect of the immunotherapy including but not limited to immunotherapy mediated skin rash, diarrhea, or inflammation of the lung, kidney, liver, thyroid or other endocrine dysfunction including type 1 diabetes mellitus. The patient would like to proceed with treatment as planned and she is expected to start the first cycle of this treatment on 12/27/2016.   The patient would come back for follow-up visit on 01/17/2017 for evaluation prior to cycle 2. She was advised to call immediately if she has any concerning symptoms in the interval. The patient voices understanding of current disease status and treatment options and is in agreement with the current care plan. All questions were answered. The patient knows to call the clinic with any problems, questions or concerns. We can certainly see the patient much sooner if necessary.   No orders of the defined types were placed in this encounter.   All questions were answered. The patient knows to call the clinic with any problems, questions or concerns. We can certainly see the patient much sooner if necessary.  Mikey Bussing, NP 12/26/2016

## 2016-12-26 NOTE — Assessment & Plan Note (Signed)
This is a very pleasant 71 year old African-American female with metastatic renal cell carcinoma that was initially diagnosed in 2010 and now presenting with bilateral pulmonary nodules. She was treated with stereotactic radiotherapy to hypermetabolic nodules without tissue diagnosis in the past. Recent imaging studies showed hypermetabolic and enlarging bilateral pulmonary nodules. CT-guided core biopsy of right pulmonary nodule was consistent with metastatic renal cell carcinoma. The patient is scheduled to begin systemic immunotherapy with Nivolumab 3 MG/KG and Ipilumumab1 MG/KG every 3 weeks for 4 cycles followed by maintenance Nivolumab 12/27/2016. I have again reviewed the adverse effect of the immunotherapy including but not limited to immunotherapy mediated skin rash, diarrhea, or inflammation of the lung, kidney, liver, thyroid or other endocrine dysfunction including type 1 diabetes mellitus. The patient would like to proceed with treatment as planned and she is expected to start the first cycle of this treatment on 12/27/2016.   The patient would come back for follow-up visit on 01/17/2017 for evaluation prior to cycle 2. She was advised to call immediately if she has any concerning symptoms in the interval. The patient voices understanding of current disease status and treatment options and is in agreement with the current care plan. All questions were answered. The patient knows to call the clinic with any problems, questions or concerns. We can certainly see the patient much sooner if necessary.

## 2016-12-26 NOTE — Telephone Encounter (Signed)
Per 10/9 - no los at check out

## 2016-12-27 ENCOUNTER — Ambulatory Visit: Payer: Medicare Other | Admitting: Internal Medicine

## 2016-12-27 ENCOUNTER — Ambulatory Visit (HOSPITAL_BASED_OUTPATIENT_CLINIC_OR_DEPARTMENT_OTHER): Payer: Medicare Other

## 2016-12-27 ENCOUNTER — Other Ambulatory Visit: Payer: Medicare Other

## 2016-12-27 VITALS — BP 108/63 | HR 83 | Temp 98.8°F | Resp 20

## 2016-12-27 DIAGNOSIS — Z5112 Encounter for antineoplastic immunotherapy: Secondary | ICD-10-CM | POA: Diagnosis not present

## 2016-12-27 DIAGNOSIS — C649 Malignant neoplasm of unspecified kidney, except renal pelvis: Secondary | ICD-10-CM

## 2016-12-27 DIAGNOSIS — C7801 Secondary malignant neoplasm of right lung: Secondary | ICD-10-CM

## 2016-12-27 DIAGNOSIS — C641 Malignant neoplasm of right kidney, except renal pelvis: Secondary | ICD-10-CM | POA: Diagnosis not present

## 2016-12-27 MED ORDER — SODIUM CHLORIDE 0.9 % IV SOLN
240.0000 mg | Freq: Once | INTRAVENOUS | Status: AC
  Administered 2016-12-27: 240 mg via INTRAVENOUS
  Filled 2016-12-27: qty 24

## 2016-12-27 MED ORDER — SODIUM CHLORIDE 0.9 % IV SOLN
Freq: Once | INTRAVENOUS | Status: AC
  Administered 2016-12-27: 14:00:00 via INTRAVENOUS

## 2016-12-27 MED ORDER — SODIUM CHLORIDE 0.9 % IV SOLN
1.0000 mg/kg | Freq: Once | INTRAVENOUS | Status: AC
  Administered 2016-12-27: 75 mg via INTRAVENOUS
  Filled 2016-12-27: qty 15

## 2016-12-27 NOTE — Patient Instructions (Addendum)
[facility] Discharge Instructions for Patients Receiving Chemotherapy  Today you received the following chemotherapy agents: Nivolumab and Ipilimumab If you develop diarrhea (3-4 stools above your normal per day) call the office.  If you develop nausea and vomiting that is not controlled by your nausea medication, call the clinic.   BELOW ARE SYMPTOMS THAT SHOULD BE REPORTED IMMEDIATELY:  *FEVER GREATER THAN 100.5 F  *CHILLS WITH OR WITHOUT FEVER  NAUSEA AND VOMITING THAT IS NOT CONTROLLED WITH YOUR NAUSEA MEDICATION  *UNUSUAL SHORTNESS OF BREATH  *UNUSUAL BRUISING OR BLEEDING  TENDERNESS IN MOUTH AND THROAT WITH OR WITHOUT PRESENCE OF ULCERS  *URINARY PROBLEMS  *BOWEL PROBLEMS  UNUSUAL RASH Items with * indicate a potential emergency and should be followed up as soon as possible.  Feel free to call the clinic should you have any questions or concerns. The clinic phone number is [phone].  Please show the [facility] at check-in to the Emergency Department and triage nurse.  Nivolumab injection What is this medicine? NIVOLUMAB (nye VOL ue mab) is a monoclonal antibody. It is used to treat melanoma, lung cancer, kidney cancer, head and neck cancer, Hodgkin lymphoma, urothelial cancer, colon cancer, and liver cancer. This medicine may be used for other purposes; ask your health care provider or pharmacist if you have questions. COMMON BRAND NAME(S): Opdivo What should I tell my health care provider before I take this medicine? They need to know if you have any of these conditions: -diabetes -immune system problems -kidney disease -liver disease -lung disease -organ transplant -stomach or intestine problems -thyroid disease -an unusual or allergic reaction to nivolumab, other medicines, foods, dyes, or preservatives -pregnant or trying to get pregnant -breast-feeding How should I use this medicine? This medicine is for infusion into a vein.  It is given by a health care professional in a hospital or clinic setting. A special MedGuide will be given to you before each treatment. Be sure to read this information carefully each time. Talk to your pediatrician regarding the use of this medicine in children. While this drug may be prescribed for children as young as 12 years for selected conditions, precautions do apply. Overdosage: If you think you have taken too much of this medicine contact a poison control center or emergency room at once. NOTE: This medicine is only for you. Do not share this medicine with others. What if I miss a dose? It is important not to miss your dose. Call your doctor or health care professional if you are unable to keep an appointment. What may interact with this medicine? Interactions have not been studied. Give your health care provider a list of all the medicines, herbs, non-prescription drugs, or dietary supplements you use. Also tell them if you smoke, drink alcohol, or use illegal drugs. Some items may interact with your medicine. This list may not describe all possible interactions. Give your health care provider a list of all the medicines, herbs, non-prescription drugs, or dietary supplements you use. Also tell them if you smoke, drink alcohol, or use illegal drugs. Some items may interact with your medicine. What should I watch for while using this medicine? This drug may make you feel generally unwell. Continue your course of treatment even though you feel ill unless your doctor tells you to stop. You may need blood work done while you are taking this medicine. Do not become pregnant while taking this medicine or for 5 months after stopping it. Women should inform their  doctor if they wish to become pregnant or think they might be pregnant. There is a potential for serious side effects to an unborn child. Talk to your health care professional or pharmacist for more information. Do not breast-feed an  infant while taking this medicine. What side effects may I notice from receiving this medicine? Side effects that you should report to your doctor or health care professional as soon as possible: -allergic reactions like skin rash, itching or hives, swelling of the face, lips, or tongue -black, tarry stools -blood in the urine -bloody or watery diarrhea -changes in vision -change in sex drive -changes in emotions or moods -chest pain -confusion -cough -decreased appetite -diarrhea -facial flushing -feeling faint or lightheaded -fever, chills -hair loss -hallucination, loss of contact with reality -headache -irritable -joint pain -loss of memory -muscle pain -muscle weakness -seizures -shortness of breath -signs and symptoms of high blood sugar such as dizziness; dry mouth; dry skin; fruity breath; nausea; stomach pain; increased hunger or thirst; increased urination -signs and symptoms of kidney injury like trouble passing urine or change in the amount of urine -signs and symptoms of liver injury like dark yellow or brown urine; general ill feeling or flu-like symptoms; light-colored stools; loss of appetite; nausea; right upper belly pain; unusually weak or tired; yellowing of the eyes or skin -stiff neck -swelling of the ankles, feet, hands -weight gain Side effects that usually do not require medical attention (report to your doctor or health care professional if they continue or are bothersome): -bone pain -constipation -tiredness -vomiting This list may not describe all possible side effects. Call your doctor for medical advice about side effects. You may report side effects to FDA at 1-800-FDA-1088. Where should I keep my medicine? This drug is given in a hospital or clinic and will not be stored at home. NOTE: This sheet is a summary. It may not cover all possible information. If you have questions about this medicine, talk to your doctor, pharmacist, or health care  provider.  2018 Elsevier/Gold Standard (2015-12-13 17:49:34) Ipilimumab injection What is this medicine? IPILIMUMAB (IP i LIM ue mab) is a monoclonal antibody. It is used to treat melanoma, a type of skin cancer. This medicine may be used for other purposes; ask your health care provider or pharmacist if you have questions. COMMON BRAND NAME(S): YERVOY What should I tell my health care provider before I take this medicine? They need to know if you have any of these conditions: -Addison's disease -blood in your stools (black or tarry stools) or if you have blood in your vomit -eye disease, vision problems -history of pancreatitis -history of stomach bleeding -immune system problems -inflammatory bowel disease -kidney disease -liver disease -lupus -myasthenia gravis -organ transplant -rheumatoid arthritis -sarcoidosis -stomach or intestine problems -thyroid disease -tingling of the fingers or toes, or other nerve disorder -an unusual or allergic reaction to ipilimumab, other medicines, foods, dyes, or preservatives -pregnant or trying to get pregnant -breast-feeding How should I use this medicine? This medicine is for infusion into a vein. It is given by a health care professional in a hospital or clinic setting. A special MedGuide will be given to you before each treatment. Be sure to read this information carefully each time. Talk to your pediatrician regarding the use of this medicine in children. While this drug may be prescribed for children as young as 12 years for selected conditions, precautions do apply. Overdosage: If you think you have taken too much of this medicine  contact a poison control center or emergency room at once. NOTE: This medicine is only for you. Do not share this medicine with others. What if I miss a dose? It is important not to miss your dose. Call your doctor or health care professional if you are unable to keep an appointment. What may interact with  this medicine? Interactions are not expected. This list may not describe all possible interactions. Give your health care provider a list of all the medicines, herbs, non-prescription drugs, or dietary supplements you use. Also tell them if you smoke, drink alcohol, or use illegal drugs. Some items may interact with your medicine. What should I watch for while using this medicine? Tell your doctor or healthcare professional if your symptoms do not start to get better or if they get worse. Do not become pregnant while taking this medicine or for 3 months after stopping it. Women should inform their doctor if they wish to become pregnant or think they might be pregnant. There is a potential for serious side effects to an unborn child. Talk to your health care professional or pharmacist for more information. Do not breast-feed an infant while taking this medicine or for 3 months after the last dose. Your condition will be monitored carefully while you are receiving this medicine. You may need blood work done while you are taking this medicine. What side effects may I notice from receiving this medicine? Side effects that you should report to your doctor or health care professional as soon as possible: -allergic reactions like skin rash, itching or hives, swelling of the face, lips, or tongue -black, tarry stools -bloody or watery diarrhea -changes in vision -dizziness -eye pain -fast, irregular heartbeat -feeling anxious -feeling faint or lightheaded, falls -nausea, vomiting -pain, tingling, numbness in the hands or feet -redness, blistering, peeling or loosening of the skin, including inside the mouth -signs and symptoms of liver injury like dark yellow or brown urine; general ill feeling or flu-like symptoms; light-colored stools; loss of appetite; nausea; right upper belly pain; unusually weak or tired; yellowing of the eyes or skin -unusual bleeding or bruising Side effects that usually do  not require medical attention (report to your doctor or health care professional if they continue or are bothersome): -headache -loss of appetite -trouble sleeping This list may not describe all possible side effects. Call your doctor for medical advice about side effects. You may report side effects to FDA at 1-800-FDA-1088. Where should I keep my medicine? This drug is given in a hospital or clinic and will not be stored at home. NOTE: This sheet is a summary. It may not cover all possible information. If you have questions about this medicine, talk to your doctor, pharmacist, or health care provider.  2018 Elsevier/Gold Standard (2015-10-12 11:41:46)

## 2017-01-01 ENCOUNTER — Telehealth: Payer: Self-pay | Admitting: Medical Oncology

## 2017-01-01 NOTE — Telephone Encounter (Addendum)
Please hold on the steroid shots for now. Pt notified.

## 2017-01-01 NOTE — Telephone Encounter (Signed)
She is doing fine after immunotherapy. Can she get a cortisone shot for her left shoulder ?Her last cortisone was in sept.  I will cc Mohamed.

## 2017-01-15 ENCOUNTER — Ambulatory Visit (INDEPENDENT_AMBULATORY_CARE_PROVIDER_SITE_OTHER): Payer: Medicare Other | Admitting: Internal Medicine

## 2017-01-15 ENCOUNTER — Encounter: Payer: Self-pay | Admitting: Internal Medicine

## 2017-01-15 VITALS — BP 104/60 | HR 93 | Ht 65.0 in | Wt 162.0 lb

## 2017-01-15 DIAGNOSIS — J9611 Chronic respiratory failure with hypoxia: Secondary | ICD-10-CM | POA: Diagnosis not present

## 2017-01-15 DIAGNOSIS — J449 Chronic obstructive pulmonary disease, unspecified: Secondary | ICD-10-CM

## 2017-01-15 NOTE — Patient Instructions (Signed)
COPD, severe Stable COPD compared to last visit  Plan - Continue oxygen, DuoNeb and daily prednisone as before - Glad you had flu shot  - cotninue chemo with Dr Earlie Server - Please talk to PCP [name], MD -  and ensure you get  shingarix vaccine   Follow-up - Return to see me in 6 months or sooner if needed

## 2017-01-15 NOTE — Progress Notes (Signed)
Subjective:     Patient ID: Melanie Best, female   DOB: 06/24/1945, 71 y.o.   MRN: [id number]  HPI   #Rt renal cell cancer Aug 2010 - Dr Diona Fanti Large right renal cell carcinoma with extension into the right renal vein and inferior vena cava  #CRI  - creat 1.22m% Oct 2014  #COPD  chronic respiratory failure - O2 dependent since 2004 (CT 2010 - emphysema, CXR - per hx clear in 2013)  -- Gold stage 3-4, fev1 0.8L/34%, Ratio 40, TLC 110%, DLCO 35% In May 2013  - Normal ECHO - May 2006 - Rx Mdds include advair, spiriva, daily prednisone 140mand duoneb  -> reduced to 58m69mred 09/09/2013 - REhab since oct 2012  - Fall 2013: Not a transplant candidate - [facility] transplant team screening  #Recurent AECOPD  - multiple in early 2013 - rx by PMD  - 03/01/2012 office visit - levaquin alone, no pred - Start N. acetylcysteine April 2014   #Ex-smoker  - 35 ppd. Quit 2007   #Baseline head tremor and walks with walkerr.   # Oct/Nov 2010  had s/p right nephrectomy for renal cell cancer discovered following MRI for leg/back pain.   #Also, s/p laminectomy following that in 2011 for "pinched nerve" by Dr MarRodell Perna# Presumed stage 1A NSCLC - RUL nodule  -   In July 2013, CT showed 2.5cm spiculared RUL nodule. d PET scan 10/18/11 where Spiculated hypermetabolic right upper lobe lung nodule. This  measures 2.5 x 1.5 cm and a S.U.V. max of 6.1 on image 77.  - ENB 11/29/11: NON Diagnostic - normal resp cells - s.p empriic curative XRT ending Nov 2013 - Dec 2013: RUL mass improved 1.6cm - March 2014 : RUL mass improved 1.4cm - March 2016 - RUL nodule - down to 1.2cm  #left lung nodules  - July 2013: LUL/LLL 1.5cm and LL 6-7mm39mMarch 2014: :LUL/LLL 1.5cm and no change LLL 8mm 7m slightly worse - Feb 2015 - LUL. LLL 1.5cm and no change. LLL 9mm a87mslightly worse -> XRT ending April 2015 - June 2015:  LUL/LLL - ? Same, LLL 7mm an86metter - March 2016:  stable  ///////////////////////////////////////////////////////////////////////////////////////  OV 01/13/2015  Chief Complaint  Patient presents with  . Follow-up    Pt c/o of dyspnea with exertion. Pt is on O2/2L pulse. Pt denies cough/wheeze/CP/tightness.     Last visit 06/30/2014.  FU COPD with chronic respiratory failure- stable. Denies admission, ER visits, pred bursts,. DuoNeb with daily prednisone tablet. Since last visit she is doing DuoNeb. She gets this at [facility] [employer]. Cause of $55 for 3 months. She is wondering if she could get something cheaper. She is asking for samples. She's not interested in Spiriva or Advair. She spends most of the day watching TV and playing with a computer husband agrees with her that she has no new issues   Fu lung cancer- s/p xrt. In April 2015. Surveillance CT scan March 2016 shows stability.. Repeat surveillance CT chest October 2016 that I personally visualized shows stability but there is a new left lower lobe lung nodule that is of large size but suspects more inflammatory and repeat nature   Renal cancer.: [facility]. But she tells me that they've asked me to do the surveillance CT of the abdomen at the time she has a surveillance CT scan of the chest.  Her urologist is Dr. Steven Preston Fleeting  CT in October 2016 did not show any evidence of recurrence   OV 07/09/2015  Chief Complaint  Patient presents with  . Follow-up    SOB worse with exertion about the same as last visit. Denies any wheezing, cp/tightness.    Follow-up COPD, lung cancer with lung nodules and also renal cancer surveillance, in the setting of chronic respiratory failure.   Last follow-up was in fall 2016. Since then she's been doing well. She has intentionally lost a lot of weight. She feels more energetic. She had CT scan in April 2017 nodules are stable. This of the chest. She is upcoming for a surveillance CT chest and renal  cancer  CT of the abdomen without contrast end of 2017. Urology has been during the surveillance scans for her kidney. There are no new issues. sHe is compliant with nebulizers.   OV 01/18/2016  Chief Complaint  Patient presents with  . Follow-up    Pt states breathing is good today. Pt c/o no congestion, tightness or SOB. Pt states no coughing. Pt breathing has not changed since last OV.       follow-up COPD, lung cancer with lung nodules and also renal cancer surveillance in the setting of chronic hypoxemic respiratory failure   last visit April 2017. She was supposed to have surveillance CT chest and CT abdomen in December 2017 and then return to see me. However this visit appointment was made prematurely. She is here for follow-up. She says that she is maintaining her weight loss and therefore she is more energetic. She has no new acute complaints. She continues to wear daily oxygen. She's had to have a CT chest which is supposed to be in December 2017. She is no acute complaints. She is up-to-date with her flu shot. Her husband also endorses no complaints on her behalf.  OV 07/17/2016  Chief Complaint  Patient presents with  . Follow-up    Pt states her breathing is unchanged since last OV. Pt c/o dry cough. Pt deneis chest congestion, CP/tightness, and f/c/s.     Follow-up advanced COPD with chronic hypoxemic respiratory failure in the setting of renal cancer surveillance and lung cancer with lung nodules  December 2017 she had CT scan of the chest and abdomen documented below. Because of concern of renal cancer and also lung cancer referrals were made. She tells me that urology has her on observation therapy. Review of the chart also shows that Dr. Julien Nordmann. A PET scan and shoulder lung nodules for of low uptake and has her on observation therapy. She tells me that overall she is functional at home. She does not as always. Over she does get around the house with oxygen and is able to  self-care. There is no new issues. She continues with oxygen, 4 mg daily prednisone and DuoNeb. Husband is here with her.  IMPRESSION:December 11 11 2017 CT chest and abdomen 1. Multifocal pulmonary nodules are again noted compatible with metastatic disease. Most of these are stable with the exception of a left lower lobe nodule which has increased in size in the interval. 2. Status post right nephrectomy without evidence for local tumor recurrence. 3. Multiple lesions of varying complexity are identified within the left kidney. Arising from the inferior pole of the left kidney is an intermediate attenuating lesion which may represent a hemorrhagic/proteinaceous cyst or solid mass. This is new when compared with 12/15/2014. More definitive assessment of this nodule may be obtained with a contrast enhanced MRI or CT  of the abdomen (renal mass protocol). 4. Aortic atherosclerosis.   Electronically Signed   By: Kerby Moors M.D.   On: 02/29/2016 10:34   OV 01/15/2017  Chief Complaint  Patient presents with  . Follow-up    CT scan 11/07/16 and a CT biopsy 12/06/16.  Pt states that she has been doing good. Denies any SOB, cough, or CP. DME: [facility] 24/7.   Follow-up advanced COPD with chronic hypoxemic respiratory failure in the setting of both renal cancer and lung cancer  Last seen in spring 2018. She returns for routine follow-up. In the interim she had a right lower lobe biopsy of her lung nodule and this showed metastatic renal cell cancerin the lung. She is now on immunotherapy for this. She received one dose. She is not reporting any deterioration in her respiratory symptoms are her baseline functional state which is on a wheelchair    has a past medical history of Anemia; Anxiety; Arthritis; Cancer ([place]); Chronic fatigue (12/13/2016); CKD (chronic kidney disease), stage II; COPD (chronic obstructive pulmonary disease) ([place]); Cough (08-02-2015); Diabetes mellitus, type 2  ([place]); DM (diabetes mellitus) ([place]) (01/27/2012); DVT (deep venous thrombosis), left; Dyslipidemia; Esophageal reflux; GERD (gastroesophageal reflux disease) (01/27/2012); Glaucoma; Goals of care, counseling/discussion (12/13/2016); Hemoptysis (08-02-15); History of radiation therapy (01/09/12,01/11/12,01/15/12,01/17/12,& 01/19/12); HTN (hypertension) (01/27/2012); Hyperlipidemia (01/27/2012); Hypertension; Leukocytosis; Lung cancer ([place]); Osteopenia; Renal cell carcinoma of right kidney ([place]) (12/13/2016); Shortness of breath; Syncope; Tremor; Urinary incontinence; and Vitamin D deficiency.   reports that she quit smoking about 12 years ago. Her smoking use included Cigarettes. She has a 35.00 pack-year smoking history. She has never used smokeless tobacco.  Past Surgical History:  Procedure Laterality Date  . BACK SURGERY     for pinched nerve, 2011, [facility]  . EYE SURGERY     cataracts removed, ?IOL  . FLEXIBLE BRONCHOSCOPY  11/29/2011  . right nephrectomy      No Known Allergies  Immunization History  Administered Date(s) Administered  . Influenza Split 11/28/2011, 12/18/2012, 12/22/2013, 12/19/2015  . Influenza Whole 11/19/2010  . Influenza-Unspecified 12/14/2014, 11/22/2016  . Pneumococcal Conjugate-13 05/14/2013  . Pneumococcal Polysaccharide-23 02/18/2004  . Td 02/24/2014    Family History  Problem Relation Age of Onset  . Emphysema Father   . Cancer Sister        behind eye  . Cancer Brother        lung  . Cancer Brother        lung  . Cancer Brother        throat     Current Outpatient Prescriptions:  .  aspirin 81 MG tablet, Take 81 mg by mouth daily., Disp: , Rfl:  .  bimatoprost (LUMIGAN) 0.01 % SOLN, Place 1 drop into both eyes at bedtime. , Disp: , Rfl:  .  Blood Glucose Monitoring Suppl (ONE TOUCH ULTRA SYSTEM KIT) w/Device KIT, 50 kits by Does not apply route once. Testing as directed, Disp: , Rfl:  .  brinzolamide (AZOPT) 1 % ophthalmic suspension, Place 1  drop into both eyes 3 (three) times daily. Reported on 08/12/2015, Disp: , Rfl:  .  clonazePAM (KLONOPIN) 1 MG tablet, Take 1 mg by mouth 2 (two) times daily as needed. For anxiety, Disp: , Rfl:  .  diltiazem (CARDIZEM CD) 180 MG 24 hr capsule, Take 180 mg by mouth daily., Disp: , Rfl: 0 .  ergocalciferol (VITAMIN D2) 50000 UNITS capsule, Take 50,000 Units by mouth once a week. Take on Monday, Disp: ,  Rfl:  .  esomeprazole (NEXIUM) 40 MG capsule, Take 40 mg by mouth daily before breakfast. , Disp: , Rfl:  .  furosemide (LASIX) 20 MG tablet, Take 20 mg by mouth daily as needed. For fluid retention, Disp: , Rfl:  .  ipratropium-albuterol (DUONEB) 0.5-2.5 (3) MG/3ML SOLN, Take 3 mLs by nebulization 4 (four) times daily as needed., Disp: 360 mL, Rfl: 4 .  JANUVIA 50 MG tablet, Take 50 mg by mouth daily. , Disp: , Rfl:  .  lovastatin (MEVACOR) 20 MG tablet, Take 20 mg by mouth at bedtime., Disp: , Rfl:  .  Misc Natural Products (RED CLOVER COMBINATION PO), Take 800 mg by mouth 2 (two) times daily., Disp: , Rfl:  .  OVER THE COUNTER MEDICATION, Take 406 mg by mouth daily., Disp: , Rfl:  .  OXYGEN, Inhale 2 L into the lungs continuous., Disp: , Rfl:  .  predniSONE (DELTASONE) 1 MG tablet, Take 4 mg by mouth every morning. Taking 4 59m  tablets  po daily, Disp: , Rfl: 0 .  primidone (MYSOLINE) 50 MG tablet, Take 200 mg by mouth at bedtime. , Disp: , Rfl:  .  traMADol (ULTRAM) 50 MG tablet, Take 50 mg by mouth 3 (three) times daily as needed., Disp: , Rfl:    Review of Systems     Objective:   Physical Exam  Vitals:   01/15/17 1220  BP: 104/60  Pulse: 93  SpO2: 100%  Weight: 162 lb (73.5 kg)  Height: _0  (1.651 m)  sitting in a wheelchair Obese Baseline head nod  no wheeze no crackles no respiratory distressAir entry equal on both sides Abdomen soft  neurologically intact Looks deconditioned     Assessment:       ICD-10-CM   1. COPD, very severe ([place] J44.9   2. Chronic hypoxemic  respiratory failure ([facility]) J96.11        Plan:     COPD, severe Stable COPD compared to last visit  Plan - Continue oxygen, DuoNeb and daily prednisone as before - Glad you had flu shot  - cotninue chemo with Dr MEarlie Server- Please talk to PCP HWenda Low MD -  and ensure you get  shingarix vaccine   Follow-up - Return to see me in 6 months or sooner if needed   (> 50% of this 15 min visit spent in face to face counseling or/and coordination of care)  Dr. MBrand Males M.D., FNovant Health Rehabilitation HospitalC.P Pulmonary and Critical Care Medicine Staff Physician [facility] Pager: [phone] If no answer or between  15:00h - 7:00h: call [phone]  01/15/2017 12:35 PM

## 2017-01-17 ENCOUNTER — Ambulatory Visit (HOSPITAL_BASED_OUTPATIENT_CLINIC_OR_DEPARTMENT_OTHER): Payer: Medicare Other

## 2017-01-17 ENCOUNTER — Encounter: Payer: Self-pay | Admitting: Internal Medicine

## 2017-01-17 ENCOUNTER — Ambulatory Visit (HOSPITAL_BASED_OUTPATIENT_CLINIC_OR_DEPARTMENT_OTHER): Payer: Medicare Other | Admitting: Internal Medicine

## 2017-01-17 ENCOUNTER — Other Ambulatory Visit (HOSPITAL_BASED_OUTPATIENT_CLINIC_OR_DEPARTMENT_OTHER): Payer: Medicare Other

## 2017-01-17 VITALS — BP 135/59 | HR 77 | Temp 98.1°F | Resp 19 | Ht 65.0 in | Wt 163.6 lb

## 2017-01-17 DIAGNOSIS — Z79899 Other long term (current) drug therapy: Secondary | ICD-10-CM

## 2017-01-17 DIAGNOSIS — C641 Malignant neoplasm of right kidney, except renal pelvis: Secondary | ICD-10-CM

## 2017-01-17 DIAGNOSIS — R5382 Chronic fatigue, unspecified: Secondary | ICD-10-CM

## 2017-01-17 DIAGNOSIS — C7801 Secondary malignant neoplasm of right lung: Secondary | ICD-10-CM | POA: Diagnosis not present

## 2017-01-17 DIAGNOSIS — R0602 Shortness of breath: Secondary | ICD-10-CM

## 2017-01-17 DIAGNOSIS — Z5112 Encounter for antineoplastic immunotherapy: Secondary | ICD-10-CM

## 2017-01-17 DIAGNOSIS — C649 Malignant neoplasm of unspecified kidney, except renal pelvis: Secondary | ICD-10-CM

## 2017-01-17 LAB — CBC WITH DIFFERENTIAL/PLATELET
BASO%: 0.5 % (ref 0.0–2.0)
Basophils Absolute: 0.1 10*3/uL (ref 0.0–0.1)
EOS ABS: 0.8 10*3/uL — AB (ref 0.0–0.5)
EOS%: 5.7 % (ref 0.0–7.0)
HEMATOCRIT: 33.2 % — AB (ref 34.8–46.6)
HEMOGLOBIN: 10.7 g/dL — AB (ref 11.6–15.9)
LYMPH#: 3.1 10*3/uL (ref 0.9–3.3)
LYMPH%: 22.2 % (ref 14.0–49.7)
MCH: 31.9 pg (ref 25.1–34.0)
MCHC: 32.2 g/dL (ref 31.5–36.0)
MCV: 99.2 fL (ref 79.5–101.0)
MONO#: 0.6 10*3/uL (ref 0.1–0.9)
MONO%: 4.5 % (ref 0.0–14.0)
NEUT#: 9.4 10*3/uL — ABNORMAL HIGH (ref 1.5–6.5)
NEUT%: 67.1 % (ref 38.4–76.8)
PLATELETS: 256 10*3/uL (ref 145–400)
RBC: 3.35 10*6/uL — ABNORMAL LOW (ref 3.70–5.45)
RDW: 13.9 % (ref 11.2–14.5)
WBC: 13.9 10*3/uL — AB (ref 3.9–10.3)

## 2017-01-17 LAB — COMPREHENSIVE METABOLIC PANEL
ALBUMIN: 3.5 g/dL (ref 3.5–5.0)
ALK PHOS: 84 U/L (ref 40–150)
ALT: 12 U/L (ref 0–55)
ANION GAP: 12 meq/L — AB (ref 3–11)
AST: 13 U/L (ref 5–34)
BUN: 13.1 mg/dL (ref 7.0–26.0)
CALCIUM: 9.1 mg/dL (ref 8.4–10.4)
CHLORIDE: 105 meq/L (ref 98–109)
CO2: 25 mEq/L (ref 22–29)
CREATININE: 1.2 mg/dL — AB (ref 0.6–1.1)
EGFR: 53 mL/min/{1.73_m2} — ABNORMAL LOW (ref >60)
Glucose: 174 mg/dl — ABNORMAL HIGH (ref 70–140)
Potassium: 3.5 mEq/L (ref 3.5–5.1)
Sodium: 141 mEq/L (ref 136–145)
Total Protein: 7.2 g/dL (ref 6.4–8.3)

## 2017-01-17 LAB — TSH: TSH: 0.696 m[IU]/L (ref 0.308–3.960)

## 2017-01-17 MED ORDER — ALBUTEROL SULFATE (2.5 MG/3ML) 0.083% IN NEBU
INHALATION_SOLUTION | RESPIRATORY_TRACT | Status: AC
  Filled 2017-01-17: qty 3

## 2017-01-17 MED ORDER — SODIUM CHLORIDE 0.9 % IV SOLN
1.0000 mg/kg | Freq: Once | INTRAVENOUS | Status: AC
  Administered 2017-01-17: 75 mg via INTRAVENOUS
  Filled 2017-01-17: qty 15

## 2017-01-17 MED ORDER — SODIUM CHLORIDE 0.9 % IV SOLN
Freq: Once | INTRAVENOUS | Status: AC
  Administered 2017-01-17: 13:00:00 via INTRAVENOUS

## 2017-01-17 MED ORDER — ALBUTEROL SULFATE (2.5 MG/3ML) 0.083% IN NEBU
2.5000 mg | INHALATION_SOLUTION | Freq: Once | RESPIRATORY_TRACT | Status: AC | PRN
  Administered 2017-01-17: 2.5 mg via RESPIRATORY_TRACT
  Filled 2017-01-17: qty 3

## 2017-01-17 MED ORDER — SODIUM CHLORIDE 0.9 % IV SOLN
3.3000 mg/kg | Freq: Once | INTRAVENOUS | Status: AC
  Administered 2017-01-17: 240 mg via INTRAVENOUS
  Filled 2017-01-17: qty 24

## 2017-01-17 NOTE — Progress Notes (Signed)
Pt states that she feels SOB and "needs a breathing treatment". Pt states that this is WNL for her, O2 sat 100%. Per Diane RN per Dr. Julien Nordmann okay for pt to receive albuterol treatment.  Pt reports symptoms subsided post albuterol treatment.

## 2017-01-17 NOTE — Progress Notes (Signed)
[place] [phone]   [phone]  OFFICE PROGRESS NOTE  [name], MD [address] [place] [address]  DIAGNOSIS:  1) Metastatic renal cell carcinoma, clear cell type, WHO nuclear grade 3 initially diagnosed in September 2010 and recently presented with bilateral pulmonary nodules. 2) history of renal cell carcinoma status post right nephrectomy in September 2010.  PRIOR THERAPY: Stereotactic radiotherapy to pulmonary nodules based on the metabolic activity with no tissue diagnosis. This was performed under the care of Dr. Pablo Ledger.  CURRENT THERAPY: first line treatment with immunotherapy with Nivolumab 3 MG/KG and Ipilumumab 1 mg/Kg every 3 weeks for 4 cycles followed by maintenance Nivolumab every 3 weeks. First dose 12/27/2016.  Status post 1 cycle.  INTERVAL HISTORY: Melanie Best 71 y.o. female returns to the clinic today for follow-up visit accompanied by her husband.  The patient tolerated the first cycle of her treatment with ipilimumab and Nivolumab fairly well.  She denied having any significant skin rash or diarrhea.  She denied having any chest pain but continues to have the baseline shortness of breath with no cough or hemoptysis.  She has no fever or chills.  She denied having any weight loss or night sweats.  The patient is here today for evaluation before starting cycle #2.  MEDICAL HISTORY: Past Medical History:  Diagnosis Date  . Anemia   . Anxiety   . Arthritis    R leg  . Cancer ([place])    kidney  . Chronic fatigue 12/13/2016  . CKD (chronic kidney disease), stage II    removed R kidney- 2010- for Cancer, followed by Dr. Diona Fanti  . COPD (chronic obstructive pulmonary disease) (HCC)    uses O2-2 liters , continuously , followed by Dr. Chase Caller  . Cough 08-02-2015  . Diabetes mellitus, type 2 ([place])   . DM (diabetes mellitus) ([place]) 01/27/2012  . DVT (deep venous thrombosis), left    2011-  treated /w coumadin   . Dyslipidemia   . Esophageal reflux   . GERD (gastroesophageal reflux disease) 01/27/2012  . Glaucoma   . Goals of care, counseling/discussion 12/13/2016  . Hemoptysis 08-02-15  . History of radiation therapy 01/09/12,01/11/12,01/15/12,01/17/12,& 01/19/12   rul lung,50Gy/22f  . HTN (hypertension) 01/27/2012  . Hyperlipidemia 01/27/2012  . Hypertension    sees Dr. HDeforest Hoylesfor PCP, denies ever having stress or ECHO, ?when & where she had  last  EKG  . Leukocytosis   . Lung cancer ([facility]    NSCLC  . Osteopenia   . Renal cell carcinoma of right kidney ([facility] 12/13/2016  . Shortness of breath   . Syncope   . Tremor   . Urinary incontinence   . Vitamin D deficiency     ALLERGIES:  has No Known Allergies.  MEDICATIONS:  Current Outpatient Prescriptions  Medication Sig Dispense Refill  . aspirin 81 MG tablet Take 81 mg by mouth daily.    . bimatoprost (LUMIGAN) 0.01 % SOLN Place 1 drop into both eyes at bedtime.     . Blood Glucose Monitoring Suppl (ONE TOUCH ULTRA SYSTEM KIT) w/Device KIT 50 kits by Does not apply route once. Testing as directed    . brinzolamide (AZOPT) 1 % ophthalmic suspension Place 1 drop into both eyes 3 (three) times daily. Reported on 08/12/2015    . clonazePAM (KLONOPIN) 1 MG tablet Take 1 mg by mouth 2 (two) times daily as needed. For anxiety    .  diltiazem (CARDIZEM CD) 180 MG 24 hr capsule Take 180 mg by mouth daily.  0  . ergocalciferol (VITAMIN D2) 50000 UNITS capsule Take 50,000 Units by mouth once a week. Take on Monday    . esomeprazole (NEXIUM) 40 MG capsule Take 40 mg by mouth daily before breakfast.     . furosemide (LASIX) 20 MG tablet Take 20 mg by mouth daily as needed. For fluid retention    . ipratropium-albuterol (DUONEB) 0.5-2.5 (3) MG/3ML SOLN Take 3 mLs by nebulization 4 (four) times daily as needed. 360 mL 4  . JANUVIA 50 MG tablet Take 50 mg by mouth daily.     Marland Kitchen lovastatin (MEVACOR) 20 MG tablet Take 20 mg by mouth at  bedtime.    . methocarbamol (ROBAXIN) 750 MG tablet Take 750 mg by mouth 2 (two) times daily as needed.  0  . Misc Natural Products (RED CLOVER COMBINATION PO) Take 800 mg by mouth 2 (two) times daily.    Marland Kitchen OVER THE COUNTER MEDICATION Take 406 mg by mouth daily.    . OXYGEN Inhale 2 L into the lungs continuous.    . predniSONE (DELTASONE) 1 MG tablet Take 4 mg by mouth every morning. Taking 4 39m  tablets  po daily  0  . primidone (MYSOLINE) 50 MG tablet Take 200 mg by mouth at bedtime.     . traMADol (ULTRAM) 50 MG tablet Take 50 mg by mouth 3 (three) times daily as needed.     No current facility-administered medications for this visit.     SURGICAL HISTORY:  Past Surgical History:  Procedure Laterality Date  . BACK SURGERY     for [facility]inched nerve[facility] . EYE SURGERY     cataracts removed, ?IOL  . FLEXIBLE BRONCHOSCOPY  11/29/2011  . right nephrectomy      REVIEW OF SYSTEMS:  A comprehensive review of systems was negative except for: Respiratory: positive for dyspnea on exertion   PHYSICAL EXAMINATION: General appearance: alert, cooperative, fatigued and no distress Head: Normocephalic, without obvious abnormality, atraumatic Neck: no adenopathy, no JVD, supple, symmetrical, trachea midline and thyroid not enlarged, symmetric, no tenderness/mass/nodules Lymph nodes: Cervical, supraclavicular, and axillary nodes normal. Resp: clear to auscultation bilaterally Back: symmetric, no curvature. ROM normal. No CVA tenderness. Cardio: regular rate and rhythm, S1, S2 normal, no murmur, click, rub or gallop GI: soft, non-tender; bowel sounds normal; no masses,  no organomegaly Extremities: extremities normal, atraumatic, no cyanosis or edema  ECOG PERFORMANCE STATUS: 1 - Symptomatic but completely ambulatory  Blood pressure (!) 135/59, pulse 77, temperature 98.1 F (36.7 C), temperature source Oral, resp. rate 19, height _0  (1.651 m), weight 163 lb 9.6 oz (74.2 kg), SpO2  100 %.  LABORATORY DATA: Lab Results  Component Value Date   WBC 13.9 (H) 01/17/2017   HGB 10.7 (L) 01/17/2017   HCT 33.2 (L) 01/17/2017   MCV 99.2 01/17/2017   PLT 256 01/17/2017      Chemistry      Component Value Date/Time   NA 141 01/17/2017 1033   K 3.5 01/17/2017 1033   CL 101 02/19/2012 1312   CO2 25 01/17/2017 1033   BUN 13.1 01/17/2017 1033   CREATININE 1.2 (H) 01/17/2017 1033      Component Value Date/Time   CALCIUM 9.1 01/17/2017 1033   ALKPHOS 84 01/17/2017 1033   AST 13 01/17/2017 1033   ALT 12 01/17/2017 1033   BILITOT <0.22 01/17/2017 1033  RADIOGRAPHIC STUDIES: No results found.  ASSESSMENT AND PLAN: This is a very pleasant 71 years old African-American female with metastatic renal cell carcinoma that was initially diagnosed in 2010 and now presenting with bilateral pulmonary nodules. She was treated with stereotactic radiotherapy to hypermetabolic nodules without tissue diagnosis in the past. Recent imaging studies showed hypermetabolic and enlarging bilateral pulmonary nodules. CT-guided core biopsy of right pulmonary nodule was consistent with metastatic renal cell carcinoma. The patient is currently undergoing treatment with immunotherapy with ipilimumab and Nivolumab status post 1 cycle. She tolerated the first cycle of her treatment fairly well with no significant adverse effects.  I recommended for the patient to proceed with cycle #2 today as a schedule. She will come back for follow-up visit in 3 weeks for evaluation before starting cycle #3. She was advised to call immediately if she has any concerning symptoms in the interval. The patient voices understanding of current disease status and treatment options and is in agreement with the current care plan. All questions were answered. The patient knows to call the clinic with any problems, questions or concerns. We can certainly see the patient much sooner if necessary.  Disclaimer: This note  was dictated with voice recognition software. Similar sounding words can inadvertently be transcribed and may not be corrected upon review.

## 2017-01-17 NOTE — Progress Notes (Signed)
Pt monitoring checklist for Melanie Best is negative.

## 2017-01-17 NOTE — Patient Instructions (Signed)
[facility] Discharge Instructions for Patients Receiving Chemotherapy  Today you received the following chemotherapy agents Nivolumab, [place]  To help prevent nausea and vomiting after your treatment, we encourage you to take your nausea medication as directed   If you develop nausea and vomiting that is not controlled by your nausea medication, call the clinic.   BELOW ARE SYMPTOMS THAT SHOULD BE REPORTED IMMEDIATELY:  *FEVER GREATER THAN 100.5 F  *CHILLS WITH OR WITHOUT FEVER  NAUSEA AND VOMITING THAT IS NOT CONTROLLED WITH YOUR NAUSEA MEDICATION  *UNUSUAL SHORTNESS OF BREATH  *UNUSUAL BRUISING OR BLEEDING  TENDERNESS IN MOUTH AND THROAT WITH OR WITHOUT PRESENCE OF ULCERS  *URINARY PROBLEMS  *BOWEL PROBLEMS  UNUSUAL RASH Items with * indicate a potential emergency and should be followed up as soon as possible.  Feel free to call the clinic should you have any questions or concerns. The clinic phone number is [phone].  Please show the [facility] at check-in to the Emergency Department and triage nurse.

## 2017-01-18 ENCOUNTER — Ambulatory Visit
Admission: RE | Admit: 2017-01-18 | Discharge: 2017-01-18 | Disposition: A | Discharge status: Home / Self Care | Payer: Medicare Other | Source: Ambulatory Visit | Attending: Internal Medicine | Admitting: Internal Medicine

## 2017-01-18 ENCOUNTER — Telehealth: Payer: Self-pay | Admitting: Medical Oncology

## 2017-01-18 ENCOUNTER — Telehealth: Payer: Self-pay | Admitting: Internal Medicine

## 2017-01-18 ENCOUNTER — Other Ambulatory Visit: Payer: Self-pay | Admitting: Medical Oncology

## 2017-01-18 DIAGNOSIS — Z1231 Encounter for screening mammogram for malignant neoplasm of breast: Secondary | ICD-10-CM

## 2017-01-18 DIAGNOSIS — I878 Other specified disorders of veins: Secondary | ICD-10-CM

## 2017-01-18 NOTE — Telephone Encounter (Signed)
Pt doing well. No complaints. She is agreeable to port and order placed.

## 2017-01-18 NOTE — Telephone Encounter (Signed)
Scheduled appt per 10/31 los - patient to get an updated schedule next visit.

## 2017-01-26 ENCOUNTER — Other Ambulatory Visit: Payer: Self-pay | Admitting: Radiology

## 2017-01-30 ENCOUNTER — Other Ambulatory Visit: Payer: Self-pay | Admitting: Internal Medicine

## 2017-01-30 ENCOUNTER — Ambulatory Visit (HOSPITAL_COMMUNITY)
Admission: RE | Admit: 2017-01-30 | Discharge: 2017-01-30 | Disposition: A | Discharge status: Home / Self Care | Payer: Medicare Other | Source: Ambulatory Visit | Attending: Internal Medicine | Admitting: Internal Medicine

## 2017-01-30 ENCOUNTER — Encounter (HOSPITAL_COMMUNITY): Payer: Self-pay

## 2017-01-30 DIAGNOSIS — J449 Chronic obstructive pulmonary disease, unspecified: Secondary | ICD-10-CM | POA: Diagnosis not present

## 2017-01-30 DIAGNOSIS — Z86718 Personal history of other venous thrombosis and embolism: Secondary | ICD-10-CM | POA: Diagnosis not present

## 2017-01-30 DIAGNOSIS — I129 Hypertensive chronic kidney disease with stage 1 through stage 4 chronic kidney disease, or unspecified chronic kidney disease: Secondary | ICD-10-CM | POA: Diagnosis not present

## 2017-01-30 DIAGNOSIS — F419 Anxiety disorder, unspecified: Secondary | ICD-10-CM | POA: Insufficient documentation

## 2017-01-30 DIAGNOSIS — E559 Vitamin D deficiency, unspecified: Secondary | ICD-10-CM | POA: Insufficient documentation

## 2017-01-30 DIAGNOSIS — M8580 Other specified disorders of bone density and structure, unspecified site: Secondary | ICD-10-CM | POA: Insufficient documentation

## 2017-01-30 DIAGNOSIS — C649 Malignant neoplasm of unspecified kidney, except renal pelvis: Secondary | ICD-10-CM | POA: Insufficient documentation

## 2017-01-30 DIAGNOSIS — N182 Chronic kidney disease, stage 2 (mild): Secondary | ICD-10-CM | POA: Diagnosis not present

## 2017-01-30 DIAGNOSIS — R5382 Chronic fatigue, unspecified: Secondary | ICD-10-CM | POA: Diagnosis not present

## 2017-01-30 DIAGNOSIS — Z87891 Personal history of nicotine dependence: Secondary | ICD-10-CM | POA: Insufficient documentation

## 2017-01-30 DIAGNOSIS — Z7952 Long term (current) use of systemic steroids: Secondary | ICD-10-CM | POA: Diagnosis not present

## 2017-01-30 DIAGNOSIS — H409 Unspecified glaucoma: Secondary | ICD-10-CM | POA: Insufficient documentation

## 2017-01-30 DIAGNOSIS — K219 Gastro-esophageal reflux disease without esophagitis: Secondary | ICD-10-CM | POA: Diagnosis not present

## 2017-01-30 DIAGNOSIS — I878 Other specified disorders of veins: Secondary | ICD-10-CM

## 2017-01-30 DIAGNOSIS — Z7982 Long term (current) use of aspirin: Secondary | ICD-10-CM | POA: Diagnosis not present

## 2017-01-30 DIAGNOSIS — E1122 Type 2 diabetes mellitus with diabetic chronic kidney disease: Secondary | ICD-10-CM | POA: Diagnosis not present

## 2017-01-30 DIAGNOSIS — M25512 Pain in left shoulder: Secondary | ICD-10-CM

## 2017-01-30 DIAGNOSIS — E785 Hyperlipidemia, unspecified: Secondary | ICD-10-CM | POA: Insufficient documentation

## 2017-01-30 HISTORY — PX: IR US GUIDE VASC ACCESS RIGHT: IMG2390

## 2017-01-30 HISTORY — DX: Cardiac arrhythmia, unspecified: I49.9

## 2017-01-30 HISTORY — PX: IR FLUORO GUIDE PORT INSERTION RIGHT: IMG5741

## 2017-01-30 LAB — CBC WITH DIFFERENTIAL/PLATELET
BASOS ABS: 0 10*3/uL (ref 0.0–0.1)
BASOS PCT: 0 %
EOS ABS: 0.1 10*3/uL (ref 0.0–0.7)
Eosinophils Relative: 1 %
HEMATOCRIT: 34.5 % — AB (ref 36.0–46.0)
HEMOGLOBIN: 10.7 g/dL — AB (ref 12.0–15.0)
Lymphocytes Relative: 16 %
Lymphs Abs: 2.2 10*3/uL (ref 0.7–4.0)
MCH: 31.6 pg (ref 26.0–34.0)
MCHC: 31 g/dL (ref 30.0–36.0)
MCV: 101.8 fL — ABNORMAL HIGH (ref 78.0–100.0)
Monocytes Absolute: 0.3 10*3/uL (ref 0.1–1.0)
Monocytes Relative: 2 %
NEUTROS ABS: 10.9 10*3/uL — AB (ref 1.7–7.7)
NEUTROS PCT: 81 %
Platelets: 320 10*3/uL (ref 150–400)
RBC: 3.39 MIL/uL — ABNORMAL LOW (ref 3.87–5.11)
RDW: 13.6 % (ref 11.5–15.5)
WBC: 13.5 10*3/uL — ABNORMAL HIGH (ref 4.0–10.5)

## 2017-01-30 LAB — BASIC METABOLIC PANEL
ANION GAP: 10 (ref 5–15)
BUN: 13 mg/dL (ref 6–20)
CALCIUM: 9.2 mg/dL (ref 8.9–10.3)
CO2: 29 mmol/L (ref 22–32)
CREATININE: 1.07 mg/dL — AB (ref 0.44–1.00)
Chloride: 102 mmol/L (ref 101–111)
GFR, EST AFRICAN AMERICAN: 60 mL/min — AB (ref >60)
GFR, EST NON AFRICAN AMERICAN: 52 mL/min — AB (ref >60)
Glucose, Bld: 119 mg/dL — ABNORMAL HIGH (ref 65–99)
Potassium: 4.3 mmol/L (ref 3.5–5.1)
SODIUM: 141 mmol/L (ref 135–145)

## 2017-01-30 LAB — GLUCOSE, CAPILLARY: GLUCOSE-CAPILLARY: 117 mg/dL — AB (ref 65–99)

## 2017-01-30 LAB — PROTIME-INR
INR: 0.96
PROTHROMBIN TIME: 12.6 s (ref 11.4–15.2)

## 2017-01-30 MED ORDER — CEFAZOLIN SODIUM-DEXTROSE 2-4 GM/100ML-% IV SOLN
INTRAVENOUS | Status: AC
  Administered 2017-01-30: 2 g via INTRAVENOUS
  Filled 2017-01-30: qty 100

## 2017-01-30 MED ORDER — LIDOCAINE-EPINEPHRINE (PF) 2 %-1:200000 IJ SOLN
INTRAMUSCULAR | Status: AC
  Filled 2017-01-30: qty 20

## 2017-01-30 MED ORDER — LIDOCAINE-EPINEPHRINE (PF) 1 %-1:200000 IJ SOLN
INTRAMUSCULAR | Status: AC | PRN
  Administered 2017-01-30: 20 mL

## 2017-01-30 MED ORDER — MIDAZOLAM HCL 2 MG/2ML IJ SOLN
INTRAMUSCULAR | Status: AC | PRN
  Administered 2017-01-30 (×2): 1 mg via INTRAVENOUS

## 2017-01-30 MED ORDER — HEPARIN SOD (PORK) LOCK FLUSH 100 UNIT/ML IV SOLN
INTRAVENOUS | Status: AC
  Filled 2017-01-30: qty 5

## 2017-01-30 MED ORDER — FENTANYL CITRATE (PF) 100 MCG/2ML IJ SOLN
INTRAMUSCULAR | Status: AC
  Filled 2017-01-30: qty 2

## 2017-01-30 MED ORDER — CEFAZOLIN SODIUM-DEXTROSE 2-4 GM/100ML-% IV SOLN
2.0000 g | INTRAVENOUS | Status: AC
  Administered 2017-01-30: 2 g via INTRAVENOUS

## 2017-01-30 MED ORDER — MIDAZOLAM HCL 2 MG/2ML IJ SOLN
INTRAMUSCULAR | Status: AC
  Filled 2017-01-30: qty 4

## 2017-01-30 MED ORDER — SODIUM CHLORIDE 0.9 % IV SOLN
INTRAVENOUS | Status: DC
  Administered 2017-01-30: 12:00:00 via INTRAVENOUS

## 2017-01-30 MED ORDER — BUPIVACAINE HCL 0.25 % IJ SOLN
4.0000 mL | INTRAMUSCULAR | Status: AC | PRN
  Administered 2017-01-30: 4 mL via INTRA_ARTICULAR

## 2017-01-30 MED ORDER — LIDOCAINE HCL 1 % IJ SOLN
3.0000 mL | INTRAMUSCULAR | Status: AC | PRN
  Administered 2017-01-30: 3 mL

## 2017-01-30 MED ORDER — METHYLPREDNISOLONE ACETATE 40 MG/ML IJ SUSP
40.0000 mg | INTRAMUSCULAR | Status: AC | PRN
  Administered 2017-01-30: 40 mg via INTRA_ARTICULAR

## 2017-01-30 MED ORDER — FENTANYL CITRATE (PF) 100 MCG/2ML IJ SOLN
INTRAMUSCULAR | Status: AC | PRN
  Administered 2017-01-30 (×2): 50 ug via INTRAVENOUS

## 2017-01-30 NOTE — Procedures (Signed)
Pre Procedure Dx: Poor venous access Post Procedural Dx: Same  Successful placement of right IJ approach port-a-cath with tip at the superior caval atrial junction. The catheter is ready for immediate use.  Estimated Blood Loss: Minimal  Complications: None immediate.  Jay Oluwadamilola Rosamond, MD Pager #: [phone]   

## 2017-01-30 NOTE — Progress Notes (Signed)
Office Visit Note   Patient: Melanie Best           Date of Birth: 1946/02/04           MRN: [id number] Visit Date: 11/30/2016              Requested by: [name], MD [address] [place], [place] [address] PCP: [name], MD   Assessment & Plan: Visit Diagnoses:  1. Acute pain of left shoulder     Plan: To give patient some relief I offered conservative treatment with injection. Patient sent shoulder prepped with Betadine and subacromial Marcaine/Depo-Medrol injection was performed. Tolerated well without complication. Follow-up the office when necessary. Return if she does not have long-term relief. Follow-Up Instructions: Return if symptoms worsen or fail to improve.   Orders:  Orders Placed This Encounter  Procedures  . Large Joint Inj  . Medium Joint Inj  . Large Joint Inj  . XR Shoulder Left   No orders of the defined types were placed in this encounter.     Procedures: Large Joint Inj: L subacromial bursa on 01/30/2017 11:14 AM Indications: pain Details: 25 G 1.5 in needle, posterior approach  Arthrogram: No  Medications: 3 mL lidocaine 1 %; 4 mL bupivacaine 0.25 %; 40 mg methylPREDNISolone acetate 40 MG/ML Consent was given by the patient. Patient was prepped and draped in the usual sterile fashion.       Clinical Data: No additional findings.   Subjective: Chief Complaint  Patient presents with  . Left Shoulder - Pain, New Patient (Initial Visit)    HPI Patient comes in today with complaints of left shoulder pain that started a few days ago. No injury. Pain aggravated with overhead reaching and going down her back. She's had injection by Dr. Lorin Mercy in the past and gave good relief. She is requesting repeat injection today. Patient has multiple medical issues. Review of Systems No current complaints of cardiac pulmonary GI GU issues.  Objective: Vital Signs: There were no vitals taken for this visit.  Physical Exam    Constitutional: She is oriented to person, place, and time. No distress.  HENT:  Head: Normocephalic and atraumatic.  Eyes: EOM are normal. Pupils are equal, round, and reactive to light.  Pulmonary/Chest: No respiratory distress.  Musculoskeletal:  Left shoulder good range of motion but with discomfort. Positive impingement test. Negative drop arm. Pain and weakness with supraspinatus resistance.  Neurological: She is alert and oriented to person, place, and time.  Skin: Skin is warm and dry.  Psychiatric: She has a normal mood and affect.    Ortho Exam  Specialty Comments:  No specialty comments available.  Imaging: No results found.   PMFS History: Patient Active Problem List   Diagnosis Date Noted  . Chronic hypoxemic respiratory failure ([place]) 01/15/2017  . Renal cell carcinoma of right kidney ([place]) 12/13/2016  . Chronic fatigue 12/13/2016  . Goals of care, counseling/discussion 12/13/2016  . Encounter for antineoplastic immunotherapy 12/13/2016  . Malignant neoplasm of left lung ([place]) 01/13/2015  . Renal insufficiency 06/30/2014  . Renal cell carcinoma ([place]) 06/30/2014  . COPD, severe ([place]) 09/13/2013  . Malignant neoplasm of lower lobe of left lung ([place]) 09/09/2013  . Malignant neoplasm of upper lobe of right lung ([place]) 09/09/2013  . Lung nodule, left upper lobe and left lower lobe 03/16/2012  . COPD exacerbation ([place]) 03/16/2012  . Chest pain 01/27/2012  . Leukocytosis 01/27/2012  . DM (diabetes mellitus) ([place])  01/27/2012  . Hyperlipidemia 01/27/2012  . CKD (chronic kidney disease), stage II 01/27/2012  . HTN (hypertension) 01/27/2012  . Anxiety 01/27/2012  . GERD (gastroesophageal reflux disease) 01/27/2012  . RUL NSCLC Prsumed STage 1A 10/13/2011  . COPD, very severe ([place]) 08/05/2011   Past Medical History:  Diagnosis Date  . Anemia   . Anxiety   . Arthritis    R leg  . Cancer ([place])    kidney  . Chronic fatigue 12/13/2016  . CKD (chronic kidney  disease), stage II    removed R kidney- 2010- for Cancer, followed by Dr. Diona Fanti  . COPD (chronic obstructive pulmonary disease) (HCC)    uses O2-2 liters , continuously , followed by Dr. Chase Caller  . Cough 08-02-2015  . Diabetes mellitus, type 2 ([place])   . DM (diabetes mellitus) ([place]) 01/27/2012  . DVT (deep venous thrombosis), left    2011- treated /w coumadin   . Dyslipidemia   . Esophageal reflux   . GERD (gastroesophageal reflux disease) 01/27/2012  . Glaucoma   . Goals of care, counseling/discussion 12/13/2016  . Hemoptysis 08-02-15  . History of radiation therapy 01/09/12,01/11/12,01/15/12,01/17/12,& 01/19/12   rul lung,50Gy/80fx  . HTN (hypertension) 01/27/2012  . Hyperlipidemia 01/27/2012  . Hypertension    sees Dr. Deforest Hoyles for PCP, denies ever having stress or ECHO, ?when & where she had  last  EKG  . Leukocytosis   . Lung cancer ([place])    NSCLC  . Osteopenia   . Renal cell carcinoma of right kidney ([place]) 12/13/2016  . Shortness of breath   . Syncope   . Tremor   . Urinary incontinence   . Vitamin D deficiency     Family History  Problem Relation Age of Onset  . Emphysema Father   . Cancer Sister        behind eye  . Cancer Brother        lung  . Cancer Brother        lung  . Cancer Brother        throat  . Breast cancer Neg Hx     Past Surgical History:  Procedure Laterality Date  . BACK SURGERY     for pinched nerve, 2011, [facility] at [facility]  . EYE SURGERY     cataracts removed, ?IOL  . FLEXIBLE BRONCHOSCOPY  11/29/2011  . right nephrectomy     Social History   Occupational History  . Not on file  Tobacco Use  . Smoking status: Former Smoker    Packs/day: 1.00    Years: 35.00    Pack years: 35.00    Types: Cigarettes    Last attempt to quit: 03/20/2004    Years since quitting: 12.8  . Smokeless tobacco: Never Used  Substance and Sexual Activity  . Alcohol use: No  . Drug use: No  . Sexual activity: Not on file

## 2017-01-30 NOTE — Discharge Instructions (Addendum)
Moderate Conscious Sedation, Adult, Care After °These instructions provide you with information about caring for yourself after your procedure. Your health care provider may also give you more specific instructions. Your treatment has been planned according to current medical practices, but problems sometimes occur. Call your health care provider if you have any problems or questions after your procedure. °What can I expect after the procedure? °After your procedure, it is common: °· To feel sleepy for several hours. °· To feel clumsy and have poor balance for several hours. °· To have poor judgment for several hours. °· To vomit if you eat too soon. ° °Follow these instructions at home: °For at least 24 hours after the procedure: ° °· Do not: °? Participate in activities where you could fall or become injured. °? Drive. °? Use heavy machinery. °? Drink alcohol. °? Take sleeping pills or medicines that cause drowsiness. °? Make important decisions or sign legal documents. °? Take care of children on your own. °· Rest. °Eating and drinking °· Follow the diet recommended by your health care provider. °· If you vomit: °? Drink water, juice, or soup when you can drink without vomiting. °? Make sure you have little or no nausea before eating solid foods. °General instructions °· Have a responsible adult stay with you until you are awake and alert. °· Take over-the-counter and prescription medicines only as told by your health care provider. °· If you smoke, do not smoke without supervision. °· Keep all follow-up visits as told by your health care provider. This is important. °Contact a health care provider if: °· You keep feeling nauseous or you keep vomiting. °· You feel light-headed. °· You develop a rash. °· You have a fever. °Get help right away if: °· You have trouble breathing. °This information is not intended to replace advice given to you by your health care provider. Make sure you discuss any questions you have  with your health care provider. °Document Released: 12/25/2012 Document Revised: 08/09/2015 Document Reviewed: 06/26/2015 °Elsevier Interactive Patient Education © 2018 Elsevier Inc[employer] ° ° °Implanted Port Insertion, Care After °This sheet gives you information about how to care for yourself after your procedure. Your health care provider may also give you more specific instructions. If you have problems or questions, contact your health care provider. °What can I expect after the procedure? °After your procedure, it is common to have: °· Discomfort at the port insertion site. °· Bruising on the skin over the port. This should improve over 3-4 days. ° °Follow these instructions at home: °Port care °· After your port is placed, you will get a manufacturer's information card. The card has information about your port. Keep this card with you at all times. °· Take care of the port as told by your health care provider. Ask your health care provider if you or a family member can get training for taking care of the port at home. A home health care nurse may also take care of the port. °· Make sure to remember what type of port you have. °Incision care °· Follow instructions from your health care provider about how to take care of your port insertion site. Make sure you: °? Wash your hands with soap and water before you change your bandage (dressing). If soap and water are not available, use hand sanitizer. °? Change your dressing as told by your health care provider.  You may remove your dressing tomorrow. °? Leave skin glue in place. These skin closures   may need to stay in place for 2 weeks or longer. If adhesive strip edges start to loosen and curl up, you may trim the loose edges. Do not remove adhesive strips completely unless your health care provider tells you to do that.  DO NOT use EMLA cream for 2 weeks after port placement as this cream will remove surgical glue.  Check your port insertion site every day for signs  of infection. Check for: ? More redness, swelling, or pain. ? More fluid or blood. ? Warmth. ? Pus or a bad smell. General instructions  Do not take baths, swim, or use a hot tub until your health care provider approves.  You may shower tomorrow.  Do not lift anything that is heavier than 10 lb (4.5 kg) for a week, or as told by your health care provider.  Ask your health care provider when it is okay to: ? Return to work or school. ? Resume usual physical activities or sports.  Do not drive for 24 hours if you were given a medicine to help you relax (sedative).  Take over-the-counter and prescription medicines only as told by your health care provider.  Wear a medical alert bracelet in case of an emergency. This will tell any health care providers that you have a port.  Keep all follow-up visits as told by your health care provider. This is important. Contact a health care provider if:  You have a fever or chills.  You have more redness, swelling, or pain around your port insertion site.  You have more fluid or blood coming from your port insertion site.  Your port insertion site feels warm to the touch.  You have pus or a bad smell coming from the port insertion site. Get help right away if:  You have chest pain or shortness of breath.  You have bleeding from your port that you cannot control. Summary  Take care of the port as told by your health care provider.  Change your dressing as told by your health care provider.  Keep all follow-up visits as told by your health care provider. This information is not intended to replace advice given to you by your health care provider. Make sure you discuss any questions you have with your health care provider. Document Released: 12/25/2012 Document Revised: 01/26/2016 Document Reviewed: 01/26/2016 Elsevier Interactive Patient Education  2017 [employer].

## 2017-01-30 NOTE — H&P (Signed)
Chief Complaint: Patient was seen in consultation today for port placement at the request of [facility]  Referring Physician(s): Mohamed,Mohamed  Supervising Physician: Sandi Mariscal  Patient Status: [facility] - Out-pt  History of Present Illness: Melanie Best is a 71 y.o. female with metastatic renal cell carcinoma. She has been receiving treatments but they have recently had a hard time with IV access. She is referred for port placement. PMHx, meds, labs, imaging reviewed. Has been NPO this am. Feels well, no recent fevers, chills, illness. Husband at bedside   Past Medical History:  Diagnosis Date  . Anemia   . Anxiety   . Arthritis    R leg  . Cancer ([place])    kidney  . Chronic fatigue 12/13/2016  . CKD (chronic kidney disease), stage II    removed R kidney- 2010- for Cancer, followed by Dr. Diona Fanti  . COPD (chronic obstructive pulmonary disease) (HCC)    uses O2-2 liters , continuously , followed by Dr. Chase Caller  . Cough 08-02-2015  . Diabetes mellitus, type 2 ([place])   . DM (diabetes mellitus) ([place]) 01/27/2012  . DVT (deep venous thrombosis), left    2011- treated /w coumadin   . Dyslipidemia   . Dysrhythmia   . Esophageal reflux   . GERD (gastroesophageal reflux disease) 01/27/2012  . Glaucoma   . Goals of care, counseling/discussion 12/13/2016  . Hemoptysis 08-02-15  . History of radiation therapy 01/09/12,01/11/12,01/15/12,01/17/12,& 01/19/12   rul lung,50Gy/10f  . HTN (hypertension) 01/27/2012  . Hyperlipidemia 01/27/2012  . Hypertension    sees Dr. [place] PCP, denies ever having stress or ECHO, ?when & where she had  last  EKG  . Leukocytosis   . Lung cancer [place]    NSCLC  . Osteopenia   . 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 Shortness of breath   . Syncope   . Tremor   . Urinary incontinence   . Vitamin D deficiency     Past Surgical History:  Procedure Laterality Date  . BACK SURGERY     [facility] . E[facility]     cataracts removed, ?IOL  . FLEXIBLE BRONCHOSCOPY  11/29/2011  . removed blood clot     left upper abd quad   . right kidney removed    . right nephrectomy      Allergies: Patient has no known allergies.  Medications: Prior to Admission medications   Medication Sig Start Date End Date Taking? Authorizing Provider  aspirin 81 MG tablet Take 81 mg by mouth daily.   Yes [provider]  bimatoprost (LUMIGAN) 0.01 % SOLN Place 1 drop into both eyes at bedtime.    Yes [provider]  brinzolamide (AZOPT) 1 % ophthalmic suspension Place 1 drop into both eyes 3 (three) times daily. Reported on 08/12/2015   Yes [provider]  clonazePAM (KLONOPIN) 1 MG tablet Take 1 mg by mouth 2 (two) times daily as needed. For anxiety   Yes [provider]  diltiazem (CARDIZEM CD) 180 MG 24 hr capsule Take 180 mg by mouth daily. 04/14/14  Yes [provider]  esomeprazole (NEXIUM) 40 MG capsule Take 40 mg by mouth daily before breakfast.    Yes [provider]  ipratropium-albuterol (DUONEB) 0.5-2.5 (3) MG/3ML SOLN Take 3 mLs by nebulization 4 (four) times daily as needed. 01/13/15  Yes Ramaswamy, MBelva Crome MD  JANUVIA 50 MG tablet Take 50 mg by mouth  daily.  06/10/13  Yes [provider]  lovastatin (MEVACOR) 20 MG tablet Take 20 mg by mouth at bedtime.   Yes [provider]  methocarbamol (ROBAXIN) 750 MG tablet Take 750 mg by mouth 2 (two) times daily as needed. 01/10/17  Yes [provider]  Misc Natural Products (RED CLOVER COMBINATION PO) Take 800 mg by mouth 2 (two) times daily.   Yes [provider]  OVER THE COUNTER MEDICATION Take 406 mg by mouth daily.   Yes [provider]  OXYGEN Inhale 2 L into the lungs continuous.   Yes [provider]  predniSONE (DELTASONE) 1 MG tablet Take 4 mg by mouth every morning. Taking 4 14m  tablets  po daily 06/15/14  Yes [provider]  primidone (MYSOLINE) 50 MG tablet Take 200 mg by mouth at bedtime.    Yes [provider]  traMADol (ULTRAM) 50 MG tablet Take 50 mg by mouth 3 (three) times daily as needed.   Yes [provider]  Blood Glucose Monitoring Suppl (ONE TOUCH ULTRA SYSTEM KIT) w/Device KIT 50 kits by Does not apply route once. Testing as directed    [provider]  ergocalciferol (VITAMIN D2) 50000 UNITS capsule Take 50,000 Units by mouth once a week. Take on Monday    [provider]  furosemide (LASIX) 20 MG tablet Take 20 mg by mouth daily as needed. For fluid retention    [provider]     Family History  Problem Relation Age of Onset  . Emphysema Father   . Cancer Sister        behind eye  . Cancer Brother        lung  . Cancer Brother        lung  . Cancer Brother        throat  . Breast cancer Neg Hx     Social History   Socioeconomic History  . Marital status: Married    Spouse name: None  . Number of children: None  . Years of education: None  . Highest education level: None  Social Needs  . Financial resource strain: None  . Food insecurity - worry: None  . Food insecurity - inability: None  . Transportation needs - medical: None  . Transportation needs - non-medical: None  Occupational History  . None  Tobacco Use  . Smoking status: Former Smoker    Packs/day: 1.00    Years: 35.00    Pack years: 35.00    Types: Cigarettes    Last attempt to quit: 03/20/2004    Years since quitting: 12.8  . Smokeless tobacco: Never Used  Substance and Sexual Activity  . Alcohol use: No  . Drug use: No  . Sexual activity: None  Other Topics Concern  . None  Social History Narrative  . None     Review of Systems: A 12 point ROS discussed and pertinent positives are indicated in the HPI above.  All other systems are negative.  Review of Systems  Vital Signs: BP 134/76 (BP Location: Right Arm)   Pulse 94   Temp 98.5 F  (36.9 C) (Oral)   Resp 16   SpO2 97%   Physical Exam  Constitutional: She is oriented to person, place, and time. She appears well-developed and well-nourished.  HENT:  Head: Normocephalic.  Mouth/Throat: Oropharynx is clear and moist.  Neck: Normal range of motion. No JVD present. No tracheal deviation present.  Cardiovascular: Normal rate,  regular rhythm and normal heart sounds.  Pulmonary/Chest: Effort normal and breath sounds normal. No respiratory distress.  Neurological: She is oriented to person, place, and time.  Skin: Skin is warm and dry.  Psychiatric: She has a normal mood and affect.    Imaging: Mm Digital Screening Bilateral  Result Date: 01/19/2017 CLINICAL DATA:  Screening. EXAM: DIGITAL SCREENING BILATERAL MAMMOGRAM WITH CAD COMPARISON:  Previous exam(s). ACR Breast Density Category b: There are scattered areas of fibroglandular density. FINDINGS: There are no findings suspicious for malignancy. Images were processed with CAD. IMPRESSION: No mammographic evidence of malignancy. A result letter of this screening mammogram will be mailed directly to the patient. RECOMMENDATION: Screening mammogram in one year. (Code:SM-B-01Y) BI-RADS CATEGORY  1: Negative. Electronically Signed   By: Dorise Bullion III M.D   On: 01/19/2017 16:02    Labs:  CBC: Recent Labs    12/19/16 1107 12/26/16 0828 01/17/17 1033 01/30/17 1207  WBC 14.9* 11.3* 13.9* 13.5*  HGB 11.2* 10.7* 10.7* 10.7*  HCT 34.5* 33.3* 33.2* 34.5*  PLT 240 209 256 320    COAGS: Recent Labs    12/04/16 0927 01/30/17 1207  INR 1.00 0.96    BMP: Recent Labs    12/19/16 1107 12/26/16 0828 01/17/17 1033 01/30/17 1207  NA 142 142 141 141  K 3.7 4.0 3.5 4.3  CL  --   --   --  102  CO2 27 30* 25 29  GLUCOSE 109 112 174* 119*  BUN 17.3 15.3 13.1 13  CALCIUM 9.2 9.3 9.1 9.2  CREATININE 1.0 1.1 1.2* 1.07*  GFRNONAA  --   --   --  52*  GFRAA  --   --   --  60*    LIVER FUNCTION TESTS: Recent Labs     11/07/16 1042 12/19/16 1107 12/26/16 0828 01/17/17 1033  BILITOT <0.22 <0.22 <0.22 <0.22  AST '11 13 11 13  ' ALT '13 24 15 12  ' ALKPHOS 78 84 83 84  PROT 7.2 6.9 6.8 7.2  ALBUMIN 3.6 3.4* 3.3* 3.5    TUMOR MARKERS: No results for input(s): AFPTM, CEA, CA199, CHROMGRNA in the last 8760 hours.  Assessment and Plan: Metastatic renal cell carcinoma For Port placement Labs reviewed, chronic leukocytosis from chronic Prednisone use. No acute infectious issues. Risks and benefits discussed with the patient including, but not limited to bleeding, infection, pneumothorax, or fibrin sheath development and need for additional procedures. All of the patient's questions were answered, patient is agreeable to proceed. Consent signed and in chart.    Thank you for this interesting consult.  I greatly enjoyed meeting LATISH TOUTANT and look forward to participating in their care.  A copy of this report was sent to the requesting provider on this date.  Electronically Signed: Ascencion Dike, PA-C 01/30/2017, 1:29 PM   I spent a total of 20 minutes in face to face in clinical consultation, greater than 50% of which was counseling/coordinating care for port

## 2017-02-07 ENCOUNTER — Other Ambulatory Visit: Payer: Self-pay | Admitting: Medical Oncology

## 2017-02-07 ENCOUNTER — Other Ambulatory Visit (HOSPITAL_BASED_OUTPATIENT_CLINIC_OR_DEPARTMENT_OTHER): Payer: Medicare Other

## 2017-02-07 ENCOUNTER — Ambulatory Visit (HOSPITAL_BASED_OUTPATIENT_CLINIC_OR_DEPARTMENT_OTHER): Payer: Medicare Other

## 2017-02-07 ENCOUNTER — Encounter: Payer: Self-pay | Admitting: Oncology

## 2017-02-07 ENCOUNTER — Ambulatory Visit (HOSPITAL_BASED_OUTPATIENT_CLINIC_OR_DEPARTMENT_OTHER): Payer: Medicare Other | Admitting: Oncology

## 2017-02-07 VITALS — BP 116/67 | HR 97 | Temp 98.4°F | Resp 17 | Ht 65.0 in | Wt 160.7 lb

## 2017-02-07 DIAGNOSIS — C641 Malignant neoplasm of right kidney, except renal pelvis: Secondary | ICD-10-CM

## 2017-02-07 DIAGNOSIS — C649 Malignant neoplasm of unspecified kidney, except renal pelvis: Secondary | ICD-10-CM

## 2017-02-07 DIAGNOSIS — Z5112 Encounter for antineoplastic immunotherapy: Secondary | ICD-10-CM

## 2017-02-07 DIAGNOSIS — C7801 Secondary malignant neoplasm of right lung: Secondary | ICD-10-CM

## 2017-02-07 DIAGNOSIS — L853 Xerosis cutis: Secondary | ICD-10-CM | POA: Diagnosis not present

## 2017-02-07 DIAGNOSIS — Z79899 Other long term (current) drug therapy: Secondary | ICD-10-CM | POA: Diagnosis not present

## 2017-02-07 DIAGNOSIS — Z95828 Presence of other vascular implants and grafts: Secondary | ICD-10-CM

## 2017-02-07 DIAGNOSIS — R5382 Chronic fatigue, unspecified: Secondary | ICD-10-CM

## 2017-02-07 LAB — COMPREHENSIVE METABOLIC PANEL
ALT: 15 U/L (ref 0–55)
AST: 13 U/L (ref 5–34)
Albumin: 3.5 g/dL (ref 3.5–5.0)
Alkaline Phosphatase: 71 U/L (ref 40–150)
Anion Gap: 11 mEq/L (ref 3–11)
BUN: 14.7 mg/dL (ref 7.0–26.0)
CHLORIDE: 102 meq/L (ref 98–109)
CO2: 29 mEq/L (ref 22–29)
CREATININE: 1.3 mg/dL — AB (ref 0.6–1.1)
Calcium: 9.2 mg/dL (ref 8.4–10.4)
EGFR: 48 mL/min/{1.73_m2} — ABNORMAL LOW (ref >60)
Glucose: 116 mg/dl (ref 70–140)
Potassium: 3.6 mEq/L (ref 3.5–5.1)
Sodium: 142 mEq/L (ref 136–145)
TOTAL PROTEIN: 7.4 g/dL (ref 6.4–8.3)
Total Bilirubin: 0.25 mg/dL (ref 0.20–1.20)

## 2017-02-07 LAB — CBC WITH DIFFERENTIAL/PLATELET
BASO%: 0.2 % (ref 0.0–2.0)
Basophils Absolute: 0 10*3/uL (ref 0.0–0.1)
EOS%: 5.4 % (ref 0.0–7.0)
Eosinophils Absolute: 0.8 10*3/uL — ABNORMAL HIGH (ref 0.0–0.5)
HEMATOCRIT: 34.5 % — AB (ref 34.8–46.6)
HGB: 10.5 g/dL — ABNORMAL LOW (ref 11.6–15.9)
LYMPH#: 2.9 10*3/uL (ref 0.9–3.3)
LYMPH%: 20.4 % (ref 14.0–49.7)
MCH: 31.5 pg (ref 25.1–34.0)
MCHC: 30.4 g/dL — AB (ref 31.5–36.0)
MCV: 103.6 fL — ABNORMAL HIGH (ref 79.5–101.0)
MONO#: 0.7 10*3/uL (ref 0.1–0.9)
MONO%: 4.6 % (ref 0.0–14.0)
NEUT%: 69.4 % (ref 38.4–76.8)
NEUTROS ABS: 9.9 10*3/uL — AB (ref 1.5–6.5)
Platelets: 280 10*3/uL (ref 145–400)
RBC: 3.33 10*6/uL — AB (ref 3.70–5.45)
RDW: 13.7 % (ref 11.2–14.5)
WBC: 14.3 10*3/uL — AB (ref 3.9–10.3)

## 2017-02-07 LAB — TSH: TSH: 0.778 m[IU]/L (ref 0.308–3.960)

## 2017-02-07 MED ORDER — ETOMIDATE 2 MG/ML IV SOLN
INTRAVENOUS | Status: AC
  Filled 2017-02-07: qty 10

## 2017-02-07 MED ORDER — SODIUM CHLORIDE 0.9% FLUSH
10.0000 mL | INTRAVENOUS | Status: DC | PRN
  Administered 2017-02-07: 10 mL
  Filled 2017-02-07: qty 10

## 2017-02-07 MED ORDER — SODIUM CHLORIDE 0.9 % IV SOLN
Freq: Once | INTRAVENOUS | Status: AC
  Administered 2017-02-07: 11:00:00 via INTRAVENOUS

## 2017-02-07 MED ORDER — LIDOCAINE-PRILOCAINE 2.5-2.5 % EX CREA
1.0000 | TOPICAL_CREAM | CUTANEOUS | 0 refills | Status: DC | PRN
Start: 2017-02-07 — End: 2017-07-17

## 2017-02-07 MED ORDER — HEPARIN SOD (PORK) LOCK FLUSH 100 UNIT/ML IV SOLN
500.0000 [IU] | Freq: Once | INTRAVENOUS | Status: AC | PRN
  Administered 2017-02-07: 500 [IU]
  Filled 2017-02-07: qty 5

## 2017-02-07 MED ORDER — LIDOCAINE-PRILOCAINE 2.5-2.5 % EX CREA: 1.0000 "application " | TOPICAL_CREAM | CUTANEOUS | 0 refills | Status: DC | PRN

## 2017-02-07 MED ORDER — SODIUM CHLORIDE 0.9 % IV SOLN
240.0000 mg | Freq: Once | INTRAVENOUS | Status: AC
  Administered 2017-02-07: 240 mg via INTRAVENOUS
  Filled 2017-02-07: qty 24

## 2017-02-07 MED ORDER — SODIUM CHLORIDE 0.9 % IV SOLN
1.0000 mg/kg | Freq: Once | INTRAVENOUS | Status: AC
  Administered 2017-02-07: 75 mg via INTRAVENOUS
  Filled 2017-02-07: qty 15

## 2017-02-07 MED ORDER — SUCCINYLCHOLINE CHLORIDE 200 MG/10ML IV SOSY
PREFILLED_SYRINGE | INTRAVENOUS | Status: AC
  Filled 2017-02-07: qty 10

## 2017-02-07 NOTE — Progress Notes (Signed)
[place] OFFICE PROGRESS NOTE  [name], MD [place] [address] [place] [address]  DIAGNOSIS:  1) Metastatic renal cell carcinoma, clear cell type, WHO nuclear grade 3 initially diagnosed in September 2010 and recently presented with bilateral pulmonary nodules. 2) history of renal cell carcinoma status post right nephrectomy in September 2010.  PRIOR THERAPY: Stereotactic radiotherapy to pulmonary nodules based on the metabolic activity with no tissue diagnosis. This was performed under the care of Dr. Pablo Ledger.  CURRENT THERAPY: first line treatment with immunotherapy with Nivolumab 3 MG/KG and Ipilumumab 1 mg/Kg every 3 weeks for 4 cycles followed by maintenance Nivolumab every 3 weeks. First dose 12/27/2016.  Status post 2 cycles.  INTERVAL HISTORY: Melanie Best 71 y.o. female returns for routine follow-up visit accompanied by her husband.  The patient continues to tolerate her treatment well with the exception of itching.  She has not noticed any rash.  She denies fevers and chills.  Denies chest pain, shortness breath, cough, hemoptysis.  Denies nausea, vomiting, constipation, diarrhea.  She has not had any significant weight loss or night sweats.  The patient is here for evaluation today prior to cycle 3 of her treatment.  MEDICAL HISTORY: Past Medical History:  Diagnosis Date  . Anemia   . Anxiety   . Arthritis    R leg  . Cancer ([place])    kidney  . Chronic fatigue 12/13/2016  . CKD (chronic kidney disease), stage II    removed R kidney- 2010- for Cancer, followed by Dr. Diona Fanti  . COPD (chronic obstructive pulmonary disease) ([facility])    uses O2-2 liters , continuously , followed by Dr. Chase Caller  . Cough 08-02-2015  . Diabetes mellitus, type 2 ([place])   . DM (diabetes mellitus) ([place]) 01/27/2012  . DVT (deep venous thrombosis), left    2011- treated /w coumadin   . Dyslipidemia   . Dysrhythmia   . Esophageal reflux   . GERD  (gastroesophageal reflux disease) 01/27/2012  . Glaucoma   . Goals of care, counseling/discussion 12/13/2016  . Hemoptysis 08-02-15  . History of radiation therapy 01/09/12,01/11/12,01/15/12,01/17/12,& 01/19/12   rul lung,50Gy/57f  . HTN (hypertension) 01/27/2012  . Hyperlipidemia 01/27/2012  . Hypertension    sees Dr. HDeforest H[place] PCP, denies ever having stress or ECHO, ?when & where she had  last  EKG  . Leukocytosis   . Lung cancer ([facility]    NSCLC  . Osteopenia   . Renal cell carcinoma of right kidney ([facility] 12/13/2016  . Shortness of breath   . Syncope   . Tremor   . Urinary incontinence   . Vitamin D deficiency     ALLERGIES:  has No Known Allergies.  MEDICATIONS:  Current Outpatient Medications  Medication Sig Dispense Refill  . aspirin 81 MG tablet Take 81 mg by mouth daily.    . bimatoprost (LUMIGAN) 0.01 % SOLN Place 1 drop into both eyes at bedtime.     . Blood Glucose Monitoring Suppl (ONE TOUCH ULTRA SYSTEM KIT) w/Device KIT 50 kits by Does not apply route once. Testing as directed    . brinzolamide (AZOPT) 1 % ophthalmic suspension Place 1 drop into both eyes 3 (three) times daily. Reported on 08/12/2015    . clonazePAM (KLONOPIN) 1 MG tablet Take 1 mg by mouth 2 (two) times daily as needed. For anxiety    . diltiazem (CARDIZEM CD) 180 MG 24 hr capsule Take 180 mg by mouth daily.  0  .  ergocalciferol (VITAMIN D2) 50000 UNITS capsule Take 50,000 Units by mouth once a week. Take on Monday    . esomeprazole (NEXIUM) 40 MG capsule Take 40 mg by mouth daily before breakfast.     . furosemide (LASIX) 20 MG tablet Take 20 mg by mouth daily as needed. For fluid retention    . ipratropium-albuterol (DUONEB) 0.5-2.5 (3) MG/3ML SOLN Take 3 mLs by nebulization 4 (four) times daily as needed. 360 mL 4  . JANUVIA 50 MG tablet Take 50 mg by mouth daily.     Marland Kitchen lovastatin (MEVACOR) 20 MG tablet Take 20 mg by mouth at bedtime.    . methocarbamol (ROBAXIN) 750 MG tablet Take 750 mg by mouth 2  (two) times daily as needed.  0  . Misc Natural Products (RED CLOVER COMBINATION PO) Take 800 mg by mouth 2 (two) times daily.    Marland Kitchen OVER THE COUNTER MEDICATION Take 406 mg by mouth daily.    . OXYGEN Inhale 2 L into the lungs continuous.    . predniSONE (DELTASONE) 1 MG tablet Take 4 mg by mouth every morning. Taking 4 34m  tablets  po daily  0  . primidone (MYSOLINE) 50 MG tablet Take 200 mg by mouth at bedtime.     . traMADol (ULTRAM) 50 MG tablet Take 50 mg by mouth 3 (three) times daily as needed.     No current facility-administered medications for this visit.    Facility-Administered Medications Ordered in Other Visits  Medication Dose Route Frequency Provider Last Rate Last Dose  . heparin lock flush 100 unit/mL  500 Units Intracatheter Once PRN MCurt Bears MD      . ipilimumab (YERVOY) 75 mg in sodium chloride 0.9 % 50 mL chemo infusion  1 mg/kg (Treatment Plan Recorded) Intravenous Once MCurt Bears MD      . sodium chloride flush (NS) 0.9 % injection 10 mL  10 mL Intracatheter PRN MCurt Bears MD        SURGICAL HISTORY:  Past Surgical History:  Procedure Laterality Date  . BACK SURGERY     [facility]GERY     cataracts removed, ?IOL  . FLEXIBLE BRONCHOSCOPY  11/29/2011  . IR FLUORO GUIDE PORT INSERTION RIGHT  01/30/2017  . IR [place] V[place] ACCESS RIGHT  01/30/2017  . removed blood clot     left upper abd quad   . right kidney removed    . right nephrectomy      REVIEW OF SYSTEMS:   Review of Systems  Constitutional: Negative for appetite change, chills, fatigue, fever and unexpected weight change.  HENT:   Negative for mouth sores, nosebleeds, sore throat and trouble swallowing.   Eyes: Negative for eye problems and icterus.  Respiratory: Negative for cough, hemoptysis, shortness of breath and wheezing.   Cardiovascular: Negative for chest pain and leg swelling.  Gastrointestinal: Negative for abdominal pain,  constipation, diarrhea, nausea and vomiting.  Genitourinary: Negative for bladder incontinence, difficulty urinating, dysuria, frequency and hematuria.   Musculoskeletal: Negative for back pain, gait problem, neck pain and neck stiffness.  Skin: Negative for and rash. Positive for itching. Neurological: Negative for dizziness, extremity weakness, gait problem, headaches, light-headedness and seizures.  Hematological: Negative for adenopathy. Does not bruise/bleed easily.  Psychiatric/Behavioral: Negative for confusion, depression and sleep disturbance. The patient is not nervous/anxious.     PHYSICAL EXAMINATION:  Blood pressure 116/67, pulse 97, temperature 98.4 F (36.9 C), temperature source  Oral, resp. rate 17, height '5\' 5"'  (1.651 m), weight 160 lb 11.2 oz (72.9 kg), SpO2 95 %.  ECOG PERFORMANCE STATUS: 1 - Symptomatic but completely ambulatory  Physical Exam  Constitutional: Oriented to person, place, and time and well-developed, well-nourished, and in no distress. No distress.  HENT:  Head: Normocephalic and atraumatic.  Mouth/Throat: Oropharynx is clear and moist. No oropharyngeal exudate.  Eyes: Conjunctivae are normal. Right eye exhibits no discharge. Left eye exhibits no discharge. No scleral icterus.  Neck: Normal range of motion. Neck supple.  Cardiovascular: Normal rate, regular rhythm, normal heart sounds and intact distal pulses.   Pulmonary/Chest: Effort normal and breath sounds normal. No respiratory distress. No wheezes. No rales.  Abdominal: Soft. Bowel sounds are normal. Exhibits no distension and no mass. There is no tenderness.  Musculoskeletal: Normal range of motion. Exhibits no edema.  Lymphadenopathy:    No cervical adenopathy.  Neurological: Alert and oriented to person, place, and time. Exhibits normal muscle tone. Coordination normal.  Skin: Skin is warm and dry. No rash noted. Not diaphoretic. No erythema. No pallor. Dry skin noted to her bilateral lower  extremities and back. Psychiatric: Mood, memory and judgment normal.  Vitals reviewed.  LABORATORY DATA: Lab Results  Component Value Date   WBC 14.3 (H) 02/07/2017   HGB 10.5 (L) 02/07/2017   HCT 34.5 (L) 02/07/2017   MCV 103.6 (H) 02/07/2017   PLT 280 02/07/2017      Chemistry      Component Value Date/Time   NA 142 02/07/2017 0858   K 3.6 02/07/2017 0858   CL 102 01/30/2017 1207   CO2 29 02/07/2017 0858   BUN 14.7 02/07/2017 0858   CREATININE 1.3 (H) 02/07/2017 0858      Component Value Date/Time   CALCIUM 9.2 02/07/2017 0858   ALKPHOS 71 02/07/2017 0858   AST 13 02/07/2017 0858   ALT 15 02/07/2017 0858   BILITOT 0.25 02/07/2017 0858       RADIOGRAPHIC STUDIES:  Ir US Guide Vasc Access Right  Result Date: 01/30/2017 INDICATION: History of renal cell carcinoma. In need of durable intravenous access for chemotherapy administration. EXAM: IMPLANTED PORT A CATH PLACEMENT WITH ULTRASOUND AND FLUOROSCOPIC GUIDANCE COMPARISON:  Chest CT - 11/07/2016 MEDICATIONS: Ancef 2 gm IV; The antibiotic was administered within an appropriate time interval prior to skin puncture. ANESTHESIA/SEDATION: Moderate (conscious) sedation was employed during this procedure. A total of Versed 2 mg and Fentanyl 100 mcg was administered intravenously. Moderate Sedation Time: 28 minutes. The patient's level of consciousness and vital signs were monitored continuously by radiology nursing throughout the procedure under my direct supervision. CONTRAST:  None FLUOROSCOPY TIME:  42 seconds (5 mGy) COMPLICATIONS: None immediate. PROCEDURE: The procedure, risks, benefits, and alternatives were explained to the patient. Questions regarding the procedure were encouraged and answered. The patient understands and consents to the procedure. The right neck and chest were prepped with chlorhexidine in a sterile fashion, and a sterile drape was applied covering the operative field. Maximum barrier sterile technique with  sterile gowns and gloves were used for the procedure. A timeout was performed prior to the initiation of the procedure. Local anesthesia was provided with 1% lidocaine with epinephrine. After creating a small venotomy incision, a micropuncture kit was utilized to access the internal jugular vein. Real-time ultrasound guidance was utilized for vascular access including the acquisition of a permanent ultrasound image documenting patency of the accessed vessel. The microwire was utilized to measure appropriate catheter length. A subcutaneous  port pocket was then created along the upper chest wall utilizing a combination of sharp and blunt dissection. The pocket was irrigated with sterile saline. A single lumen ISP power injectable port was chosen for placement. The 8 Fr catheter was tunneled from the port pocket site to the venotomy incision. The port was placed in the pocket. The external catheter was trimmed to appropriate length. At the venotomy, an 8 Fr peel-away sheath was placed over a guidewire under fluoroscopic guidance. The catheter was then placed through the sheath and the sheath was removed. Final catheter positioning was confirmed and documented with a fluoroscopic spot radiograph. The port was accessed with a Huber needle, aspirated and flushed with heparinized saline. The venotomy site was closed with an interrupted 4-0 Vicryl suture. The port pocket incision was closed with interrupted 2-0 Vicryl suture and the skin was opposed with a running subcuticular 4-0 Vicryl suture. Dermabond and Steri-strips were applied to both incisions. Dressings were placed. The patient tolerated the procedure well without immediate post procedural complication. FINDINGS: After catheter placement, the tip lies within the superior cavoatrial junction. The catheter aspirates and flushes normally and is ready for immediate use. IMPRESSION: Successful placement of a right internal jugular approach power injectable Port-A-Cath.  The catheter is ready for immediate use. Electronically Signed   By: Sandi Mariscal M.D.   On: 01/30/2017 14:52   Mm Digital Screening Bilateral  Result Date: 01/19/2017 CLINICAL DATA:  Screening. EXAM: DIGITAL SCREENING BILATERAL MAMMOGRAM WITH CAD COMPARISON:  Previous exam(s). ACR Breast Density Category b: There are scattered areas of fibroglandular density. FINDINGS: There are no findings suspicious for malignancy. Images were processed with CAD. IMPRESSION: No mammographic evidence of malignancy. A result letter of this screening mammogram will be mailed directly to the patient. RECOMMENDATION: Screening mammogram in one year. (Code:SM-B-01Y) BI-RADS CATEGORY  1: Negative. Electronically Signed   By: Dorise Bullion III M.D   On: 01/19/2017 16:02   Ir Fluoro Guide Port Insertion Right  Result Date: 01/30/2017 INDICATION: History of renal cell carcinoma. In need of durable intravenous access for chemotherapy administration. EXAM: IMPLANTED PORT A CATH PLACEMENT WITH ULTRASOUND AND FLUOROSCOPIC GUIDANCE COMPARISON:  Chest CT - 11/07/2016 MEDICATIONS: Ancef 2 gm IV; The antibiotic was administered within an appropriate time interval prior to skin puncture. ANESTHESIA/SEDATION: Moderate (conscious) sedation was employed during this procedure. A total of Versed 2 mg and Fentanyl 100 mcg was administered intravenously. Moderate Sedation Time: 28 minutes. The patient's level of consciousness and vital signs were monitored continuously by radiology nursing throughout the procedure under my direct supervision. CONTRAST:  None FLUOROSCOPY TIME:  42 seconds (5 mGy) COMPLICATIONS: None immediate. PROCEDURE: The procedure, risks, benefits, and alternatives were explained to the patient. Questions regarding the procedure were encouraged and answered. The patient understands and consents to the procedure. The right neck and chest were prepped with chlorhexidine in a sterile fashion, and a sterile drape was applied  covering the operative field. Maximum barrier sterile technique with sterile gowns and gloves were used for the procedure. A timeout was performed prior to the initiation of the procedure. Local anesthesia was provided with 1% lidocaine with epinephrine. After creating a small venotomy incision, a micropuncture kit was utilized to access the internal jugular vein. Real-time ultrasound guidance was utilized for vascular access including the acquisition of a permanent ultrasound image documenting patency of the accessed vessel. The microwire was utilized to measure appropriate catheter length. A subcutaneous port pocket was then created along the upper chest  wall utilizing a combination of sharp and blunt dissection. The pocket was irrigated with sterile saline. A single lumen ISP power injectable port was chosen for placement. The 8 Fr catheter was tunneled from the port pocket site to the venotomy incision. The port was placed in the pocket. The external catheter was trimmed to appropriate length. At the venotomy, an 8 Fr peel-away sheath was placed over a guidewire under fluoroscopic guidance. The catheter was then placed through the sheath and the sheath was removed. Final catheter positioning was confirmed and documented with a fluoroscopic spot radiograph. The port was accessed with a Huber needle, aspirated and flushed with heparinized saline. The venotomy site was closed with an interrupted 4-0 Vicryl suture. The port pocket incision was closed with interrupted 2-0 Vicryl suture and the skin was opposed with a running subcuticular 4-0 Vicryl suture. Dermabond and Steri-strips were applied to both incisions. Dressings were placed. The patient tolerated the procedure well without immediate post procedural complication. FINDINGS: After catheter placement, the tip lies within the superior cavoatrial junction. The catheter aspirates and flushes normally and is ready for immediate use. IMPRESSION: Successful  placement of a right internal jugular approach power injectable Port-A-Cath. The catheter is ready for immediate use. Electronically Signed   By: Sandi Mariscal M.D.   On: 01/30/2017 14:52     ASSESSMENT/PLAN:  Renal cell carcinoma This is a very pleasant 71 year old African-American female with metastatic renal cell carcinoma that was initially diagnosed in 2010 and now presenting with bilateral pulmonary nodules. She was treated with stereotactic radiotherapy to hypermetabolic nodules without tissue diagnosis in the past. Recent imaging studies showed hypermetabolic and enlarging bilateral pulmonary nodules. CT-guided core biopsy of right pulmonary nodule was consistent with metastatic renal cell carcinoma. The patient is currently undergoing treatment with immunotherapy with ipilimumab and Nivolumab status post 1 cycle. She tolerated the first cycle of her treatment fairly well with no significant adverse effects.  I recommended for the patient to proceed with cycle #3 today as a scheduled.  The patient will have a restaging CT scan prior to her next visit. She will come back for follow-up visit in 3 weeks for evaluation before starting cycle #4 and to review her restaging CT scan results.  For her dry skin, I recommend that she use a moisturizer at least twice a day.  She was advised to call immediately if she has any concerning symptoms in the interval. The patient voices understanding of current disease status and treatment options and is in agreement with the current care plan. All questions were answered. The patient knows to call the clinic with any problems, questions or concerns. We can certainly see the patient much sooner if necessary.  Orders Placed This Encounter  Procedures  . CT ABDOMEN PELVIS W CONTRAST    Standing Status:   Future    Standing Expiration Date:   02/07/2018    Order Specific Question:   If indicated for the ordered procedure, I authorize the administration of  contrast media per Radiology protocol    Answer:   Yes    Order Specific Question:   Preferred imaging location?    Answer:   [facility]    Order Specific Question:   Radiology Contrast Protocol - do NOT remove file path    Answer:   [email or web address]    Order Specific Question:   Reason for Exam additional comments    Answer:   Metastatic renal cell carcinoma. Restaging.  Marland Kitchen CT  CHEST W CONTRAST    Standing Status:   Future    Standing Expiration Date:   02/07/2018    Order Specific Question:   If indicated for the ordered procedure, I authorize the administration of contrast media per Radiology protocol    Answer:   Yes    Order Specific Question:   Preferred imaging location?    Answer:   [facility]    Order Specific Question:   Radiology Contrast Protocol - do NOT remove file path    Answer:   [email or web address]    Order Specific Question:   Reason for Exam additional comments    Answer:   Metastatic renal cell carcinoma. Restaging.    Mikey Bussing, DNP, AGPCNP-BC, AOCNP 02/07/17

## 2017-02-07 NOTE — Addendum Note (Signed)
Addended by: Ardeen Garland on: 02/07/2017 04:19 PM   Modules accepted: Orders

## 2017-02-07 NOTE — Patient Instructions (Signed)
[facility] Discharge Instructions for Patients Receiving Chemotherapy  Today you received the following chemotherapy agents: Nivolumab and Yervoy.  To help prevent nausea and vomiting after your treatment, we encourage you to take your nausea medication as directed.   If you develop nausea and vomiting that is not controlled by your nausea medication, call the clinic.   BELOW ARE SYMPTOMS THAT SHOULD BE REPORTED IMMEDIATELY:  *FEVER GREATER THAN 100.5 F  *CHILLS WITH OR WITHOUT FEVER  NAUSEA AND VOMITING THAT IS NOT CONTROLLED WITH YOUR NAUSEA MEDICATION  *UNUSUAL SHORTNESS OF BREATH  *UNUSUAL BRUISING OR BLEEDING  TENDERNESS IN MOUTH AND THROAT WITH OR WITHOUT PRESENCE OF ULCERS  *URINARY PROBLEMS  *BOWEL PROBLEMS  UNUSUAL RASH Items with * indicate a potential emergency and should be followed up as soon as possible.  Feel free to call the clinic should you have any questions or concerns. The clinic phone number is [phone].  Please show the CHEMO ALERT CARD at check-in to the Emergency Department and triage nurse. 

## 2017-02-07 NOTE — Assessment & Plan Note (Signed)
This is a very pleasant 71 year old African-American female with metastatic renal cell carcinoma that was initially diagnosed in 2010 and now presenting with bilateral pulmonary nodules. She was treated with stereotactic radiotherapy to hypermetabolic nodules without tissue diagnosis in the past. Recent imaging studies showed hypermetabolic and enlarging bilateral pulmonary nodules. CT-guided core biopsy of right pulmonary nodule was consistent with metastatic renal cell carcinoma. The patient is currently undergoing treatment with immunotherapy with ipilimumab and Nivolumab status post 1 cycle. She tolerated the first cycle of her treatment fairly well with no significant adverse effects.  I recommended for the patient to proceed with cycle #3 today as a scheduled.  The patient will have a restaging CT scan prior to her next visit. She will come back for follow-up visit in 3 weeks for evaluation before starting cycle #4 and to review her restaging CT scan results.  For her dry skin, I recommend that she use a moisturizer at least twice a day.  She was advised to call immediately if she has any concerning symptoms in the interval. The patient voices understanding of current disease status and treatment options and is in agreement with the current care plan. All questions were answered. The patient knows to call the clinic with any problems, questions or concerns. We can certainly see the patient much sooner if necessary.

## 2017-02-09 ENCOUNTER — Other Ambulatory Visit: Payer: Self-pay | Admitting: *Deleted

## 2017-02-09 MED ORDER — TRAMADOL HCL 50 MG PO TABS: 50.0000 mg | ORAL_TABLET | Freq: Three times a day (TID) | ORAL | 0 refills | Status: DC | PRN

## 2017-02-09 NOTE — Telephone Encounter (Signed)
Pt requesting something for inflation . States she is having pain in her joints. States PCP office is closed until Monday.  Will give refill of tramadol for 7 days.

## 2017-02-17 ENCOUNTER — Other Ambulatory Visit: Payer: Self-pay | Admitting: Hematology

## 2017-02-17 ENCOUNTER — Telehealth: Payer: Self-pay | Admitting: Hematology

## 2017-02-17 MED ORDER — DEXAMETHASONE 4 MG PO TABS: 4.0000 mg | ORAL_TABLET | Freq: Every day | ORAL | 0 refills | Status: DC

## 2017-02-17 NOTE — Telephone Encounter (Signed)
Pt called and report severe skin itchiness for the past 10 days.  Her last treatment of Nivolumab and Ipi was on 11/21.  She denies skin rash, edema, difficulty breathing, or other new symptoms.  She was seen by her primary care physician, and has tried Benadryl and hydroxyzine, with limited benefit.  I called in dexamethasone 4 mg daily for 5 days, and informed patient to go to emergency room if her symptoms gets worse or if she develops new symptoms.  For [facility] at [facility] on Monday, she will call Dr. Lew Dawes office on Monday morning, for further instruction.  Truitt Merle  02/17/2017

## 2017-02-20 ENCOUNTER — Telehealth: Payer: Self-pay

## 2017-02-20 ENCOUNTER — Ambulatory Visit (HOSPITAL_BASED_OUTPATIENT_CLINIC_OR_DEPARTMENT_OTHER): Payer: Medicare Other | Admitting: Medical

## 2017-02-20 VITALS — BP 152/70 | HR 118 | Temp 98.3°F | Resp 28 | Ht 65.0 in | Wt 159.5 lb

## 2017-02-20 DIAGNOSIS — C641 Malignant neoplasm of right kidney, except renal pelvis: Secondary | ICD-10-CM

## 2017-02-20 DIAGNOSIS — L299 Pruritus, unspecified: Secondary | ICD-10-CM | POA: Diagnosis not present

## 2017-02-20 DIAGNOSIS — L298 Other pruritus: Secondary | ICD-10-CM

## 2017-02-20 DIAGNOSIS — C7801 Secondary malignant neoplasm of right lung: Secondary | ICD-10-CM | POA: Diagnosis not present

## 2017-02-20 DIAGNOSIS — T50905A Adverse effect of unspecified drugs, medicaments and biological substances, initial encounter: Principal | ICD-10-CM

## 2017-02-20 MED ORDER — PREDNISONE 10 MG PO TABS: ORAL_TABLET | ORAL | 0 refills | Status: DC

## 2017-02-20 NOTE — Progress Notes (Signed)
[facility] Progress Note   Melanie Best [id number] 12-24-1945 71 y.o.  Melanie Best is managed by Dr. Dalbert Batman  Actively treated with chemotherapy/immunotherapy: yes  Current Therapy: Nivolumab and Yervoy  Last Treated: 11 / 21 / 2018  Assessment: Plan:    Itching due to drug - Plan: hydrOXYzine (ATARAX/VISTARIL) 25 MG tablet, predniSONE (DELTASONE) 10 MG tablet   Pruritus due to immunotherapy: Patient was told that she could continue using Atarax, Aveeno, Benadryl, and Goldbond lotion.  She was given a prescription for a 8-week high-dose prednisone taper.  A prescription for hydrocortisone cream 2% to be applied 4 times daily was sent to her pharmacy.  Dr. Earlie Server discussed holding her next cycle of therapy.  She will have CT scans completed and will return as scheduled on 02/28/2017.  The patient is diabetic.  She has been instructed to contact her primary care provider Dr. Lysle Rubens to discuss her insulin dosing.  This patient was seen with Dr. Julien Nordmann with my treatment plan reviewed with him. He expressed agreement with my medical management of this patient.  Please see After Visit Summary for patient specific instructions.  Future Appointments  Date Time Provider [place]  02/26/2017  2:30 PM [facility] 2 [facility] [place]  02/28/2017 10:30 AM [facility] LAB 4 [facility] None  02/28/2017 10:45 AM [facility] J32 DNS [facility] None  02/28/2017 11:15 AM Curt Bears, MD [facility] None  02/28/2017 12:15 PM [facility] A2 [facility] None  03/21/2017  9:15 AM [facility] LAB 1 [facility] None  03/21/2017  9:30 AM [facility] J32 DNS [facility] None  03/21/2017 10:00 AM Curcio, Roselie Awkward, NP [facility] None  03/21/2017 11:00 AM [facility] G24 [facility] None    No orders of the defined types were placed in this encounter.      Subjective:   Patient ID:  Melanie Best is a 71 y.o. (DOB [birth year]) female.  Chief Complaint:    Chief Complaint  Patient presents with  . Pruritis    HPI Melanie Best is a 71 year old female with a metastatic renal cell carcinoma, clear cell type, WHO nuclear grade 3 which was initially diagnosed in September 2010.  She is currently treated with Nivolumab and Yervoy.  She presents to the office today with ongoing pruritus.  She reports that an area of itching started at a rash in her left chest after her second cycle of chemotherapy.  This was treated with over-the-counter steroid cream with the left chest rash resolving.  She then developed itching on her legs which continued through her third cycle of immunotherapy.  She reports that the itching is severe.  She has tried Atarax, prednisone 4 mg daily, Aveeno, Benadryl, and Goldbond without resolution of her symptoms.  She reports that the itching was so severe over the past couple of days that she had thoughts of suicide and entered her mind.  She states that she would consider committing suicide if she had to continue with this itching.  Medications: I have reviewed the patient's current medications.  Allergies: No Known Allergies  Past Medical History:  Diagnosis Date  . Anemia   . Anxiety   . Arthritis    R leg  . Cancer ([place])    kidney  . Chronic fatigue 12/13/2016  . CKD (chronic kidney disease), stage II    removed R kidney- 2010- for Cancer, followed by Dr. Diona Fanti  . COPD (chronic obstructive pulmonary disease) ([facility])    uses O2-2 liters , continuously ,  followed by Dr. Chase Caller  . Cough 08-02-2015  . Diabetes mellitus, type 2 ([place])   . DM (diabetes mellitus) ([place]) 01/27/2012  . DVT (deep venous thrombosis), left    2011- treated /w coumadin   . Dyslipidemia   . Dysrhythmia   . Esophageal reflux   . GERD (gastroesophageal reflux disease) 01/27/2012  . Glaucoma   . Goals of care, counseling/discussion 12/13/2016  . Hemoptysis 08-02-15  . History of radiation therapy 01/09/12,01/11/12,01/15/12,01/17/12,& 01/19/12    rul lung,50Gy/67f  . HTN (hypertension) 01/27/2012  . Hyperlipidemia 01/27/2012  . Hypertension    sees Dr. [place] [place] PCP, denies ever having stress or ECHO, ?when & where she had  last  EKG  . Leukocytosis   . Lung cancer ([place]    NSCLC  . Osteopenia   . [place] 12/13/2016  . Shortness of breath   . Syncope   . Tremor   . Urinary incontinence   . Vitamin D deficiency     Past Surgical History:  Procedure Laterality Date  . BACK SURGERY     [facility] . EYE SURGERY     cataracts removed, ?IOL  . FLEXIBLE BRONCHOSCOPY  11/29/2011  . IR FLUORO GUIDE PORT INSERTION RIGHT  01/30/2017  . IR [place] VASC ACCESS RIGHT  01/30/2017  . removed blood clot     left upper abd quad   . right kidney removed    . right nephrectomy      Family History  Problem Relation Age of Onset  . Emphysema Father   . Cancer Sister        behind eye  . Cancer Brother        lung  . Cancer Brother        lung  . Cancer Brother        throat  . Breast cancer Neg Hx     Social History   Socioeconomic History  . Marital status: Married    Spouse name: Not on file  . Number of children: Not on file  . Years of education: Not on file  . Highest education level: Not on file  Social Needs  . Financial resource strain: Not on file  . Food insecurity - worry: Not on file  . Food insecurity - inability: Not on file  . Transportation needs - medical: Not on file  . Transportation needs - non-medical: Not on file  Occupational History  . Not on file  Tobacco Use  . Smoking status: Former Smoker    Packs/day: 1.00    Years: 35.00    Pack years: 35.00    Types: Cigarettes    Last attempt to quit: 03/20/2004    Years since quitting: 12.9  . Smokeless tobacco: Never Used  Substance and Sexual Activity  . Alcohol use: No  . Drug use: No  . Sexual activity: Not on file  Other Topics Concern  . Not on file  Social History Narrative   . Not on file    Past Medical History, Surgical history, Social history, and Family history were reviewed and updated as appropriate.   Please see review of systems for further details on the patient's review from today.   Review of Systems:  Review of Systems  Constitutional: Negative for chills, diaphoresis and fever.  HENT: Negative for mouth sores and trouble swallowing.   Respiratory: Negative for chest tightness and shortness of breath.  Cardiovascular: Negative for chest pain and leg swelling.  Skin:       Pruritus over the left chest and lower extremities.    Objective:   Physical Exam:  BP (!) 152/70 (BP Location: Left Arm, Patient Position: Sitting)   Pulse (!) 118   Temp 98.3 F (36.8 C) (Oral)   Resp (!) 28   Ht _0  (1.651 m)   Wt 159 lb 8 oz (72.3 kg)   SpO2 98%   BMI 26.54 kg/m  ECOG: 1  Physical Exam  Constitutional: No distress.  HENT:  Head: Normocephalic and atraumatic.  Cardiovascular: Normal rate, regular rhythm and normal heart sounds. Exam reveals no gallop and no friction rub.  No murmur heard. Pulmonary/Chest: Effort normal. No respiratory distress. She has no wheezes. She has no rales.  The patient is receiving O2 via nasal cannula.  Decreased breath sounds throughout all lung fields.  Musculoskeletal: She exhibits no edema.  Neurological: She is alert. Coordination normal.  Skin: Skin is warm and dry. No rash noted. She is not diaphoretic. No erythema.    Lab Review:     Component Value Date/Time   NA 142 02/07/2017 0858   K 3.6 02/07/2017 0858   CL 102 01/30/2017 1207   CO2 29 02/07/2017 0858   GLUCOSE 116 02/07/2017 0858   BUN 14.7 02/07/2017 0858   CREATININE 1.3 (H) 02/07/2017 0858   CALCIUM 9.2 02/07/2017 0858   PROT 7.4 02/07/2017 0858   ALBUMIN 3.5 02/07/2017 0858   AST 13 02/07/2017 0858   ALT 15 02/07/2017 0858   ALKPHOS 71 02/07/2017 0858   BILITOT 0.25 02/07/2017 0858   GFRNONAA 52 (L) 01/30/2017 1207   GFRAA 60  (L) 01/30/2017 1207       Component Value Date/Time   WBC 14.3 (H) 02/07/2017 0858   WBC 13.5 (H) 01/30/2017 1207   RBC 3.33 (L) 02/07/2017 0858   RBC 3.39 (L) 01/30/2017 1207   HGB 10.5 (L) 02/07/2017 0858   HCT 34.5 (L) 02/07/2017 0858   PLT 280 02/07/2017 0858   MCV 103.6 (H) 02/07/2017 0858   MCH 31.5 02/07/2017 0858   MCH 31.6 01/30/2017 1207   MCHC 30.4 (L) 02/07/2017 0858   MCHC 31.0 01/30/2017 1207   RDW 13.7 02/07/2017 0858   LYMPHSABS 2.9 02/07/2017 0858   MONOABS 0.7 02/07/2017 0858   EOSABS 0.8 (H) 02/07/2017 0858   BASOSABS 0.0 02/07/2017 0858   -------------------------------  Imaging from last 24 hours (if applicable):  Radiology interpretation: Ir US Guide Vasc Access Right  Result Date: 01/30/2017 INDICATION: History of renal cell carcinoma. In need of durable intravenous access for chemotherapy administration. EXAM: IMPLANTED PORT A CATH PLACEMENT WITH ULTRASOUND AND FLUOROSCOPIC GUIDANCE COMPARISON:  Chest CT - 11/07/2016 MEDICATIONS: Ancef 2 gm IV; The antibiotic was administered within an appropriate time interval prior to skin puncture. ANESTHESIA/SEDATION: Moderate (conscious) sedation was employed during this procedure. A total of Versed 2 mg and Fentanyl 100 mcg was administered intravenously. Moderate Sedation Time: 28 minutes. The patient's level of consciousness and vital signs were monitored continuously by radiology nursing throughout the procedure under my direct supervision. CONTRAST:  None FLUOROSCOPY TIME:  42 seconds (5 mGy) COMPLICATIONS: None immediate. PROCEDURE: The procedure, risks, benefits, and alternatives were explained to the patient. Questions regarding the procedure were encouraged and answered. The patient understands and consents to the procedure. The right neck and chest were prepped with chlorhexidine in a sterile fashion, and a sterile drape was applied covering  the operative field. Maximum barrier sterile technique with sterile gowns  and gloves were used for the procedure. A timeout was performed prior to the initiation of the procedure. Local anesthesia was provided with 1% lidocaine with epinephrine. After creating a small venotomy incision, a micropuncture kit was utilized to access the internal jugular vein. Real-time ultrasound guidance was utilized for vascular access including the acquisition of a permanent ultrasound image documenting patency of the accessed vessel. The microwire was utilized to measure appropriate catheter length. A subcutaneous port pocket was then created along the upper chest wall utilizing a combination of sharp and blunt dissection. The pocket was irrigated with sterile saline. A single lumen ISP power injectable port was chosen for placement. The 8 Fr catheter was tunneled from the port pocket site to the venotomy incision. The port was placed in the pocket. The external catheter was trimmed to appropriate length. At the venotomy, an 8 Fr peel-away sheath was placed over a guidewire under fluoroscopic guidance. The catheter was then placed through the sheath and the sheath was removed. Final catheter positioning was confirmed and documented with a fluoroscopic spot radiograph. The port was accessed with a Huber needle, aspirated and flushed with heparinized saline. The venotomy site was closed with an interrupted 4-0 Vicryl suture. The port pocket incision was closed with interrupted 2-0 Vicryl suture and the skin was opposed with a running subcuticular 4-0 Vicryl suture. Dermabond and Steri-strips were applied to both incisions. Dressings were placed. The patient tolerated the procedure well without immediate post procedural complication. FINDINGS: After catheter placement, the tip lies within the superior cavoatrial junction. The catheter aspirates and flushes normally and is ready for immediate use. IMPRESSION: Successful placement of a right internal jugular approach power injectable Port-A-Cath. The catheter  is ready for immediate use. Electronically Signed   By: Sandi Mariscal M.D.   On: 01/30/2017 14:52   Ir Fluoro Guide Port Insertion Right  Result Date: 01/30/2017 INDICATION: History of renal cell carcinoma. In need of durable intravenous access for chemotherapy administration. EXAM: IMPLANTED PORT A CATH PLACEMENT WITH ULTRASOUND AND FLUOROSCOPIC GUIDANCE COMPARISON:  Chest CT - 11/07/2016 MEDICATIONS: Ancef 2 gm IV; The antibiotic was administered within an appropriate time interval prior to skin puncture. ANESTHESIA/SEDATION: Moderate (conscious) sedation was employed during this procedure. A total of Versed 2 mg and Fentanyl 100 mcg was administered intravenously. Moderate Sedation Time: 28 minutes. The patient's level of consciousness and vital signs were monitored continuously by radiology nursing throughout the procedure under my direct supervision. CONTRAST:  None FLUOROSCOPY TIME:  42 seconds (5 mGy) COMPLICATIONS: None immediate. PROCEDURE: The procedure, risks, benefits, and alternatives were explained to the patient. Questions regarding the procedure were encouraged and answered. The patient understands and consents to the procedure. The right neck and chest were prepped with chlorhexidine in a sterile fashion, and a sterile drape was applied covering the operative field. Maximum barrier sterile technique with sterile gowns and gloves were used for the procedure. A timeout was performed prior to the initiation of the procedure. Local anesthesia was provided with 1% lidocaine with epinephrine. After creating a small venotomy incision, a micropuncture kit was utilized to access the internal jugular vein. Real-time ultrasound guidance was utilized for vascular access including the acquisition of a permanent ultrasound image documenting patency of the accessed vessel. The microwire was utilized to measure appropriate catheter length. A subcutaneous port pocket was then created along the upper chest wall  utilizing a combination of sharp and blunt dissection.  The pocket was irrigated with sterile saline. A single lumen ISP power injectable port was chosen for placement. The 8 Fr catheter was tunneled from the port pocket site to the venotomy incision. The port was placed in the pocket. The external catheter was trimmed to appropriate length. At the venotomy, an 8 Fr peel-away sheath was placed over a guidewire under fluoroscopic guidance. The catheter was then placed through the sheath and the sheath was removed. Final catheter positioning was confirmed and documented with a fluoroscopic spot radiograph. The port was accessed with a Huber needle, aspirated and flushed with heparinized saline. The venotomy site was closed with an interrupted 4-0 Vicryl suture. The port pocket incision was closed with interrupted 2-0 Vicryl suture and the skin was opposed with a running subcuticular 4-0 Vicryl suture. Dermabond and Steri-strips were applied to both incisions. Dressings were placed. The patient tolerated the procedure well without immediate post procedural complication. FINDINGS: After catheter placement, the tip lies within the superior cavoatrial junction. The catheter aspirates and flushes normally and is ready for immediate use. IMPRESSION: Successful placement of a right internal jugular approach power injectable Port-A-Cath. The catheter is ready for immediate use. Electronically Signed   By: Sandi Mariscal M.D.   On: 01/30/2017 14:52      ADDENDUM: Hematology/Oncology Attending: I had a face-to-face encounter with the patient today.  I recommended her care plan.  This is a very pleasant 71 years old African-American female with metastatic renal cell carcinoma who was a started recently on immunotherapy with ipilimumab and Nivolumab status post 3 cycles.  The patient has been tolerating her treatment well except recently she started developing significant itching with mild skin rash.  She was seen by her primary  care physician and was started on Atarax with no significant improvement.  She called over the weekend and she was a started on treatment with Decadron 4 mg p.o. twice daily by my partner but also no significant improvement in her condition but has mild relief. The patient came today for further evaluation and recommendation regarding her condition.  She denied having any other significant complaints.  She denied having any diarrhea. I had a lengthy discussion with the patient today about her condition.  I explained to the patient that her itching is likely secondary to the immunotherapy treatment. I recommended for her to hold her treatment with immunotherapy for now. We will start the patient on high-dose steroids with prednisone 1 mg/kilogram on a daily basis for 7 days then will gradually taper her dose over the next 5-6 weeks.  She will be off treatment with immunotherapy during this time.  She was also advised to continue on Atarax as well as hydrocortisone topical lotions for the itchy areas. The patient is also scheduled to have repeat CT scan of the chest, abdomen and pelvis next week for restaging of her disease. We will see the patient back for follow-up visit next week for evaluation and management of any adverse effect of her treatment. She was advised to call immediately if she has any concerning symptoms in the interval. Disclaimer: This note was dictated with voice recognition software. Similar sounding words can inadvertently be transcribed and may be missed upon review. Eilleen Kempf, MD 02/20/17

## 2017-02-20 NOTE — Telephone Encounter (Signed)
Pt called that she has been itching for 13 days since her last chemo. nivolumab and yervoy on 11/21. She has tried hydroxyzine from her PCP that lasts only 2 hours. Dr Burr Medico rx dexamethasone 4 mg daily on Saturday 12/1. Dexamethasone only lasts 6 hours. Pt denies rash or fever. She itches all over and is about to break the skin from scratching.   S/w [name] and he wants pt to be seen. Set up pt for [facility]. No labs ordered. inbasket sent. Pt can be here at 1030.

## 2017-02-22 ENCOUNTER — Other Ambulatory Visit: Payer: Self-pay | Admitting: Medical Oncology

## 2017-02-22 DIAGNOSIS — C3432 Malignant neoplasm of lower lobe, left bronchus or lung: Secondary | ICD-10-CM

## 2017-02-22 MED ORDER — TRAMADOL HCL 50 MG PO TABS: 50.0000 mg | ORAL_TABLET | Freq: Three times a day (TID) | ORAL | 0 refills | Status: DC | PRN

## 2017-02-26 ENCOUNTER — Ambulatory Visit (HOSPITAL_COMMUNITY): Admission: RE | Admit: 2017-02-26 | Payer: Medicare Other | Source: Ambulatory Visit

## 2017-02-27 MED ORDER — DIPHENHYDRAMINE HCL 50 MG/ML IJ SOLN
INTRAMUSCULAR | Status: AC
  Filled 2017-02-27: qty 1

## 2017-02-27 MED ORDER — PALONOSETRON HCL INJECTION 0.25 MG/5ML
INTRAVENOUS | Status: AC
  Filled 2017-02-27: qty 5

## 2017-02-27 MED ORDER — FAMOTIDINE IN NACL 20-0.9 MG/50ML-% IV SOLN
INTRAVENOUS | Status: AC
Start: 2017-02-27 — End: 2017-02-27
  Filled 2017-02-27: qty 50

## 2017-02-27 MED ORDER — DEXAMETHASONE SODIUM PHOSPHATE 10 MG/ML IJ SOLN
INTRAMUSCULAR | Status: AC
  Filled 2017-02-27: qty 1

## 2017-02-28 ENCOUNTER — Telehealth: Payer: Self-pay | Admitting: Internal Medicine

## 2017-02-28 ENCOUNTER — Ambulatory Visit: Payer: Medicare Other

## 2017-02-28 ENCOUNTER — Other Ambulatory Visit (HOSPITAL_BASED_OUTPATIENT_CLINIC_OR_DEPARTMENT_OTHER): Payer: Medicare Other

## 2017-02-28 ENCOUNTER — Encounter: Payer: Self-pay | Admitting: Internal Medicine

## 2017-02-28 ENCOUNTER — Ambulatory Visit (HOSPITAL_BASED_OUTPATIENT_CLINIC_OR_DEPARTMENT_OTHER): Payer: Medicare Other | Admitting: Internal Medicine

## 2017-02-28 VITALS — BP 113/56 | HR 88 | Temp 98.7°F | Resp 20 | Wt 162.0 lb

## 2017-02-28 DIAGNOSIS — C641 Malignant neoplasm of right kidney, except renal pelvis: Secondary | ICD-10-CM | POA: Diagnosis not present

## 2017-02-28 DIAGNOSIS — C7801 Secondary malignant neoplasm of right lung: Secondary | ICD-10-CM | POA: Diagnosis not present

## 2017-02-28 DIAGNOSIS — Z79899 Other long term (current) drug therapy: Secondary | ICD-10-CM | POA: Diagnosis not present

## 2017-02-28 DIAGNOSIS — C3432 Malignant neoplasm of lower lobe, left bronchus or lung: Secondary | ICD-10-CM

## 2017-02-28 DIAGNOSIS — L299 Pruritus, unspecified: Secondary | ICD-10-CM | POA: Diagnosis not present

## 2017-02-28 DIAGNOSIS — R5382 Chronic fatigue, unspecified: Secondary | ICD-10-CM

## 2017-02-28 LAB — CBC WITH DIFFERENTIAL/PLATELET
BASO%: 0.1 % (ref 0.0–2.0)
Basophils Absolute: 0 10*3/uL (ref 0.0–0.1)
EOS ABS: 0 10*3/uL (ref 0.0–0.5)
EOS%: 0.1 % (ref 0.0–7.0)
HCT: 37.4 % (ref 34.8–46.6)
HGB: 11.7 g/dL (ref 11.6–15.9)
LYMPH%: 11.8 % — AB (ref 14.0–49.7)
MCH: 31.6 pg (ref 25.1–34.0)
MCHC: 31.3 g/dL — AB (ref 31.5–36.0)
MCV: 101.1 fL — AB (ref 79.5–101.0)
MONO#: 0.9 10*3/uL (ref 0.1–0.9)
MONO%: 3.7 % (ref 0.0–14.0)
NEUT#: 20.2 10*3/uL — ABNORMAL HIGH (ref 1.5–6.5)
NEUT%: 84.3 % — AB (ref 38.4–76.8)
PLATELETS: 270 10*3/uL (ref 145–400)
RBC: 3.7 10*6/uL (ref 3.70–5.45)
RDW: 14.7 % — ABNORMAL HIGH (ref 11.2–14.5)
WBC: 24 10*3/uL — ABNORMAL HIGH (ref 3.9–10.3)
lymph#: 2.8 10*3/uL (ref 0.9–3.3)

## 2017-02-28 LAB — TSH: TSH: 0.837 m(IU)/L (ref 0.308–3.960)

## 2017-02-28 LAB — COMPREHENSIVE METABOLIC PANEL
ALBUMIN: 3.5 g/dL (ref 3.5–5.0)
ALK PHOS: 71 U/L (ref 40–150)
ALT: 39 U/L (ref 0–55)
AST: 20 U/L (ref 5–34)
Anion Gap: 15 mEq/L — ABNORMAL HIGH (ref 3–11)
BUN: 33.1 mg/dL — AB (ref 7.0–26.0)
CO2: 22 mEq/L (ref 22–29)
CREATININE: 1.3 mg/dL — AB (ref 0.6–1.1)
Calcium: 9.1 mg/dL (ref 8.4–10.4)
Chloride: 104 mEq/L (ref 98–109)
EGFR: 47 mL/min/{1.73_m2} — ABNORMAL LOW (ref >60)
GLUCOSE: 123 mg/dL (ref 70–140)
POTASSIUM: 3.7 meq/L (ref 3.5–5.1)
SODIUM: 141 meq/L (ref 136–145)
Total Bilirubin: 0.33 mg/dL (ref 0.20–1.20)
Total Protein: 6.9 g/dL (ref 6.4–8.3)

## 2017-02-28 NOTE — Telephone Encounter (Signed)
Scheduled appt per 12/12 los - Gave patient aVS And calender per los.

## 2017-02-28 NOTE — Progress Notes (Signed)
[place] [phone]   [phone]  OFFICE PROGRESS NOTE  [name], MD [address] [place] [address]  DIAGNOSIS:  1) Metastatic renal cell carcinoma, clear cell type, WHO nuclear grade 3 initially diagnosed in September 2010 and recently presented with bilateral pulmonary nodules. 2) history of renal cell carcinoma status post right nephrectomy in September 2010.  PRIOR THERAPY: Stereotactic radiotherapy to pulmonary nodules based on the metabolic activity with no tissue diagnosis. This was performed under the care of Dr. Pablo Ledger.  CURRENT THERAPY: first line treatment with immunotherapy with Nivolumab 3 MG/KG and Ipilumumab 1 mg/Kg every 3 weeks for 4 cycles followed by maintenance Nivolumab every 3 weeks. First dose 12/27/2016.  Status post 3 cycles.  INTERVAL HISTORY: Melanie Best 71 y.o. female returns to the clinic today for follow-up visit accompanied by her husband.  The patient is feeling much better today.  Her itching has significantly improved after starting the taper dose of prednisone.  She is currently on 60 mg p.o. daily.  She denied having any current chest pain, shortness of breath, cough or hemoptysis.  She denied having any fever or chills.  She has no nausea, vomiting, diarrhea or constipation.  No significant weight loss or night sweats.  She is here today for evaluation and repeat blood work.  She was supposed to have repeat CT scan of the chest earlier this week but this was rescheduled because of the snow condition to be done on 03/02/2017.  MEDICAL HISTORY: Past Medical History:  Diagnosis Date  . Anemia   . Anxiety   . Arthritis    R leg  . Cancer ([place])    kidney  . Chronic fatigue 12/13/2016  . CKD (chronic kidney disease), stage II    removed R kidney- 2010- for Cancer, followed by Dr. Diona Fanti  . COPD (chronic obstructive pulmonary disease) ([facility])    uses O2-2 liters , continuously ,  followed by Dr. Chase Caller  . Cough 08-02-2015  . Diabetes mellitus, type 2 ([place])   . DM (diabetes mellitus) ([place]) 01/27/2012  . DVT (deep venous thrombosis), left    2011- treated /w coumadin   . Dyslipidemia   . Dysrhythmia   . Esophageal reflux   . GERD (gastroesophageal reflux disease) 01/27/2012  . Glaucoma   . Goals of care, counseling/discussion 12/13/2016  . Hemoptysis 08-02-15  . History of radiation therapy 01/09/12,01/11/12,01/15/12,01/17/12,& 01/19/12   rul lung,50Gy/37f  . HTN (hypertension) 01/27/2012  . Hyperlipidemia 01/27/2012  . Hypertension    sees Dr. [place] [place] [facility], denies ever having stress or ECHO, ?when & where she had  last  EKG  . Leukocytosis   . [place]    NSCLC  . Osteopenia   . [place] 12/13[place]2016  . Shortness of breath   . Syncope   . Tremor   . Urinary incontinence   . Vitamin D deficiency     ALLERGIES:  has No Known Allergies.  MEDICATIONS:  Current Outpatient Medications  Medication Sig Dispense Refill  . aspirin 81 MG tablet Take 81 mg by mouth daily.    . bimatoprost (LUMIGAN) 0.01 % SOLN Place 1 drop into both eyes at bedtime.     . Blood Glucose Monitoring Suppl (ONE TOUCH ULTRA SYSTEM KIT) w/Device KIT 50 kits by Does not apply route once. Testing as directed    . brinzolamide (AZOPT) 1 % ophthalmic suspension Place  1 drop into both eyes 3 (three) times daily. Reported on 08/12/2015    . clonazePAM (KLONOPIN) 1 MG tablet Take 1 mg by mouth 2 (two) times daily as needed. For anxiety    . dexamethasone (DECADRON) 4 MG tablet Take 1 tablet (4 mg total) by mouth daily. 5 tablet 0  . diltiazem (CARDIZEM CD) 180 MG 24 hr capsule Take 180 mg by mouth daily.  0  . ergocalciferol (VITAMIN D2) 50000 UNITS capsule Take 50,000 Units by mouth once a week. Take on Monday    . esomeprazole (NEXIUM) 40 MG capsule Take 40 mg by mouth daily before breakfast.     . furosemide (LASIX) 20 MG tablet Take 20 mg by mouth  daily as needed. For fluid retention    . hydrOXYzine (ATARAX/VISTARIL) 25 MG tablet Take 25 mg by mouth every 8 (eight) hours as needed.    Marland Kitchen ipratropium-albuterol (DUONEB) 0.5-2.5 (3) MG/3ML SOLN Take 3 mLs by nebulization 4 (four) times daily as needed. 360 mL 4  . JANUVIA 50 MG tablet Take 50 mg by mouth daily.     Marland Kitchen lidocaine-prilocaine (EMLA) cream Apply 1 application topically as needed. Apply small amount on a cotton ball and apply to port site. Apply 1-2 hours prior to labs or chemo 30 g 0  . lovastatin (MEVACOR) 20 MG tablet Take 20 mg by mouth at bedtime.    . methocarbamol (ROBAXIN) 750 MG tablet Take 750 mg by mouth 2 (two) times daily as needed.  0  . Misc Natural Products (RED CLOVER COMBINATION PO) Take 800 mg by mouth 2 (two) times daily.    Marland Kitchen OVER THE COUNTER MEDICATION Take 406 mg by mouth daily.    . OXYGEN Inhale 2 L into the lungs continuous.    . predniSONE (DELTASONE) 1 MG tablet Take 4 mg by mouth every morning. Taking 4 28m  tablets  po daily  0  . predniSONE (DELTASONE) 10 MG tablet 7 tab QD x 7 days, 6 tab QD x 7 days, 5 tab QD x 7 days, 4 tab QD x 7 days, 3 tab QD x 7 days, 2 tab QD x 7 days, 1 tab QD x 7 days, 1/2 tab QD x 7 day 200 tablet 0  . primidone (MYSOLINE) 50 MG tablet Take 200 mg by mouth at bedtime.     . traMADol (ULTRAM) 50 MG tablet Take 1 tablet (50 mg total) by mouth 3 (three) times daily as needed. 21 tablet 0   No current facility-administered medications for this visit.     SURGICAL HISTORY:  Past Surgical History:  Procedure Laterality Date  . BACK SURGERY     [facility] EYE SURGERY     cataracts removed, ?IOL  . FLEXIBLE BRONCHOSCOPY  11/29/2011  . IR FLUORO GUIDE PORT INSERTION RIGHT  01/30/2017  . IR [place] VASC ACCESS RIGHT  01/30/2017  . removed blood clot     left upper abd quad   . right kidney removed    . right nephrectomy      REVIEW OF SYSTEMS:  A comprehensive review of systems was negative.     PHYSICAL EXAMINATION: General appearance: alert, cooperative and no distress Head: Normocephalic, without obvious abnormality, atraumatic Neck: no adenopathy, no JVD, supple, symmetrical, trachea midline and thyroid not enlarged, symmetric, no tenderness/mass/nodules Lymph nodes: Cervical, supraclavicular, and axillary nodes normal. Resp: clear to auscultation bilaterally Back: symmetric, no curvature. ROM normal. No  CVA tenderness. Cardio: regular rate and rhythm, S1, S2 normal, no murmur, click, rub or gallop GI: soft, non-tender; bowel sounds normal; no masses,  no organomegaly Extremities: extremities normal, atraumatic, no cyanosis or edema  ECOG PERFORMANCE STATUS: 1 - Symptomatic but completely ambulatory  Blood pressure (!) 113/56, pulse 88, temperature 98.7 F (37.1 C), temperature source Oral, resp. rate 20, weight 162 lb (73.5 kg), SpO2 97 %.  LABORATORY DATA: Lab Results  Component Value Date   WBC 24.0 (H) 02/28/2017   HGB 11.7 02/28/2017   HCT 37.4 02/28/2017   MCV 101.1 (H) 02/28/2017   PLT 270 02/28/2017      Chemistry      Component Value Date/Time   NA 141 02/28/2017 1001   K 3.7 02/28/2017 1001   CL 102 01/30/2017 1207   CO2 22 02/28/2017 1001   BUN 33.1 (H) 02/28/2017 1001   CREATININE 1.3 (H) 02/28/2017 1001      Component Value Date/Time   CALCIUM 9.1 02/28/2017 1001   ALKPHOS 71 02/28/2017 1001   AST 20 02/28/2017 1001   ALT 39 02/28/2017 1001   BILITOT 0.33 02/28/2017 1001       RADIOGRAPHIC STUDIES: Ir US Guide Vasc Access Right  Result Date: 01/30/2017 INDICATION: History of renal cell carcinoma. In need of durable intravenous access for chemotherapy administration. EXAM: IMPLANTED PORT A CATH PLACEMENT WITH ULTRASOUND AND FLUOROSCOPIC GUIDANCE COMPARISON:  Chest CT - 11/07/2016 MEDICATIONS: Ancef 2 gm IV; The antibiotic was administered within an appropriate time interval prior to skin puncture. ANESTHESIA/SEDATION: Moderate (conscious)  sedation was employed during this procedure. A total of Versed 2 mg and Fentanyl 100 mcg was administered intravenously. Moderate Sedation Time: 28 minutes. The patient's level of consciousness and vital signs were monitored continuously by radiology nursing throughout the procedure under my direct supervision. CONTRAST:  None FLUOROSCOPY TIME:  42 seconds (5 mGy) COMPLICATIONS: None immediate. PROCEDURE: The procedure, risks, benefits, and alternatives were explained to the patient. Questions regarding the procedure were encouraged and answered. The patient understands and consents to the procedure. The right neck and chest were prepped with chlorhexidine in a sterile fashion, and a sterile drape was applied covering the operative field. Maximum barrier sterile technique with sterile gowns and gloves were used for the procedure. A timeout was performed prior to the initiation of the procedure. Local anesthesia was provided with 1% lidocaine with epinephrine. After creating a small venotomy incision, a micropuncture kit was utilized to access the internal jugular vein. Real-time ultrasound guidance was utilized for vascular access including the acquisition of a permanent ultrasound image documenting patency of the accessed vessel. The microwire was utilized to measure appropriate catheter length. A subcutaneous port pocket was then created along the upper chest wall utilizing a combination of sharp and blunt dissection. The pocket was irrigated with sterile saline. A single lumen ISP power injectable port was chosen for placement. The 8 Fr catheter was tunneled from the port pocket site to the venotomy incision. The port was placed in the pocket. The external catheter was trimmed to appropriate length. At the venotomy, an 8 Fr peel-away sheath was placed over a guidewire under fluoroscopic guidance. The catheter was then placed through the sheath and the sheath was removed. Final catheter positioning was confirmed  and documented with a fluoroscopic spot radiograph. The port was accessed with a Huber needle, aspirated and flushed with heparinized saline. The venotomy site was closed with an interrupted 4-0 Vicryl suture. The port pocket incision was  closed with interrupted 2-0 Vicryl suture and the skin was opposed with a running subcuticular 4-0 Vicryl suture. Dermabond and Steri-strips were applied to both incisions. Dressings were placed. The patient tolerated the procedure well without immediate post procedural complication. FINDINGS: After catheter placement, the tip lies within the superior cavoatrial junction. The catheter aspirates and flushes normally and is ready for immediate use. IMPRESSION: Successful placement of a right internal jugular approach power injectable Port-A-Cath. The catheter is ready for immediate use. Electronically Signed   By: Sandi Mariscal M.D.   On: 01/30/2017 14:52   Ir Fluoro Guide Port Insertion Right  Result Date: 01/30/2017 INDICATION: History of renal cell carcinoma. In need of durable intravenous access for chemotherapy administration. EXAM: IMPLANTED PORT A CATH PLACEMENT WITH ULTRASOUND AND FLUOROSCOPIC GUIDANCE COMPARISON:  Chest CT - 11/07/2016 MEDICATIONS: Ancef 2 gm IV; The antibiotic was administered within an appropriate time interval prior to skin puncture. ANESTHESIA/SEDATION: Moderate (conscious) sedation was employed during this procedure. A total of Versed 2 mg and Fentanyl 100 mcg was administered intravenously. Moderate Sedation Time: 28 minutes. The patient's level of consciousness and vital signs were monitored continuously by radiology nursing throughout the procedure under my direct supervision. CONTRAST:  None FLUOROSCOPY TIME:  42 seconds (5 mGy) COMPLICATIONS: None immediate. PROCEDURE: The procedure, risks, benefits, and alternatives were explained to the patient. Questions regarding the procedure were encouraged and answered. The patient understands and  consents to the procedure. The right neck and chest were prepped with chlorhexidine in a sterile fashion, and a sterile drape was applied covering the operative field. Maximum barrier sterile technique with sterile gowns and gloves were used for the procedure. A timeout was performed prior to the initiation of the procedure. Local anesthesia was provided with 1% lidocaine with epinephrine. After creating a small venotomy incision, a micropuncture kit was utilized to access the internal jugular vein. Real-time ultrasound guidance was utilized for vascular access including the acquisition of a permanent ultrasound image documenting patency of the accessed vessel. The microwire was utilized to measure appropriate catheter length. A subcutaneous port pocket was then created along the upper chest wall utilizing a combination of sharp and blunt dissection. The pocket was irrigated with sterile saline. A single lumen ISP power injectable port was chosen for placement. The 8 Fr catheter was tunneled from the port pocket site to the venotomy incision. The port was placed in the pocket. The external catheter was trimmed to appropriate length. At the venotomy, an 8 Fr peel-away sheath was placed over a guidewire under fluoroscopic guidance. The catheter was then placed through the sheath and the sheath was removed. Final catheter positioning was confirmed and documented with a fluoroscopic spot radiograph. The port was accessed with a Huber needle, aspirated and flushed with heparinized saline. The venotomy site was closed with an interrupted 4-0 Vicryl suture. The port pocket incision was closed with interrupted 2-0 Vicryl suture and the skin was opposed with a running subcuticular 4-0 Vicryl suture. Dermabond and Steri-strips were applied to both incisions. Dressings were placed. The patient tolerated the procedure well without immediate post procedural complication. FINDINGS: After catheter placement, the tip lies within  the superior cavoatrial junction. The catheter aspirates and flushes normally and is ready for immediate use. IMPRESSION: Successful placement of a right internal jugular approach power injectable Port-A-Cath. The catheter is ready for immediate use. Electronically Signed   By: Sandi Mariscal M.D.   On: 01/30/2017 14:52    ASSESSMENT AND PLAN: This is a  very pleasant 71 years old African-American female with metastatic renal cell carcinoma that was initially diagnosed in 2010 and now presenting with bilateral pulmonary nodules. She was treated with stereotactic radiotherapy to hypermetabolic nodules without tissue diagnosis in the past. Recent imaging studies showed hypermetabolic and enlarging bilateral pulmonary nodules. CT-guided core biopsy of right pulmonary nodule was consistent with metastatic renal cell carcinoma. The patient is currently undergoing treatment with immunotherapy with ipilimumab and Nivolumab status post 3 cycles. She has been tolerating this treatment well except for severe itching started 2 weeks ago. Her treatment is currently on hold.  We started the patient on a taper dose of prednisone and she is currently on 60 mg p.o. Daily. She is feeling much better and she has significant improvement in the itching. I recommended for the patient to continue with a taper dose of prednisone and she will come back for follow-up visit in 1 week for reevaluation and discussion of the CT scan results. The patient was advised to call immediately if she has any other concerning symptoms in the interval. The patient voices understanding of current disease status and treatment options and is in agreement with the current care plan. All questions were answered. The patient knows to call the clinic with any problems, questions or concerns. We can certainly see the patient much sooner if necessary.  Disclaimer: This note was dictated with voice recognition software. Similar sounding words can  inadvertently be transcribed and may not be corrected upon review.

## 2017-02-28 NOTE — Progress Notes (Signed)
Patient states she is not going to be treated today. Asked to have labs drawn from peripheral site. PAC not accessed.

## 2017-03-02 ENCOUNTER — Other Ambulatory Visit: Payer: Self-pay | Admitting: Oncology

## 2017-03-02 ENCOUNTER — Encounter (HOSPITAL_COMMUNITY): Payer: Self-pay

## 2017-03-02 ENCOUNTER — Ambulatory Visit (HOSPITAL_COMMUNITY)
Admission: RE | Admit: 2017-03-02 | Discharge: 2017-03-02 | Disposition: A | Discharge status: Home / Self Care | Payer: Medicare Other | Source: Ambulatory Visit | Attending: Oncology | Admitting: Oncology

## 2017-03-02 DIAGNOSIS — Z905 Acquired absence of kidney: Secondary | ICD-10-CM | POA: Diagnosis not present

## 2017-03-02 DIAGNOSIS — C649 Malignant neoplasm of unspecified kidney, except renal pelvis: Secondary | ICD-10-CM | POA: Diagnosis present

## 2017-03-02 DIAGNOSIS — I2510 Atherosclerotic heart disease of native coronary artery without angina pectoris: Secondary | ICD-10-CM | POA: Insufficient documentation

## 2017-03-02 DIAGNOSIS — J439 Emphysema, unspecified: Secondary | ICD-10-CM | POA: Diagnosis not present

## 2017-03-02 DIAGNOSIS — C7800 Secondary malignant neoplasm of unspecified lung: Secondary | ICD-10-CM | POA: Diagnosis not present

## 2017-03-02 DIAGNOSIS — I700 Atherosclerosis of aorta: Secondary | ICD-10-CM | POA: Insufficient documentation

## 2017-03-02 MED ORDER — IOPAMIDOL (ISOVUE-300) INJECTION 61%
INTRAVENOUS | Status: AC
  Administered 2017-03-02: 100 mL via INTRAVENOUS
  Filled 2017-03-02: qty 100

## 2017-03-08 ENCOUNTER — Other Ambulatory Visit (HOSPITAL_BASED_OUTPATIENT_CLINIC_OR_DEPARTMENT_OTHER): Payer: Medicare Other

## 2017-03-08 ENCOUNTER — Ambulatory Visit (HOSPITAL_BASED_OUTPATIENT_CLINIC_OR_DEPARTMENT_OTHER): Payer: Medicare Other | Admitting: Oncology

## 2017-03-08 ENCOUNTER — Telehealth: Payer: Self-pay | Admitting: Internal Medicine

## 2017-03-08 VITALS — BP 129/68 | HR 100 | Temp 99.0°F | Resp 16 | Ht 65.0 in | Wt 160.2 lb

## 2017-03-08 DIAGNOSIS — N2889 Other specified disorders of kidney and ureter: Secondary | ICD-10-CM

## 2017-03-08 DIAGNOSIS — C7801 Secondary malignant neoplasm of right lung: Secondary | ICD-10-CM

## 2017-03-08 DIAGNOSIS — C649 Malignant neoplasm of unspecified kidney, except renal pelvis: Secondary | ICD-10-CM

## 2017-03-08 DIAGNOSIS — R0602 Shortness of breath: Secondary | ICD-10-CM

## 2017-03-08 DIAGNOSIS — K130 Diseases of lips: Secondary | ICD-10-CM | POA: Diagnosis not present

## 2017-03-08 DIAGNOSIS — L299 Pruritus, unspecified: Secondary | ICD-10-CM

## 2017-03-08 DIAGNOSIS — C7802 Secondary malignant neoplasm of left lung: Secondary | ICD-10-CM | POA: Diagnosis not present

## 2017-03-08 DIAGNOSIS — C641 Malignant neoplasm of right kidney, except renal pelvis: Secondary | ICD-10-CM | POA: Diagnosis not present

## 2017-03-08 DIAGNOSIS — C3432 Malignant neoplasm of lower lobe, left bronchus or lung: Secondary | ICD-10-CM

## 2017-03-08 LAB — COMPREHENSIVE METABOLIC PANEL
ALBUMIN: 3.2 g/dL — AB (ref 3.5–5.0)
ALT: 20 U/L (ref 0–55)
ANION GAP: 12 meq/L — AB (ref 3–11)
AST: 8 U/L (ref 5–34)
Alkaline Phosphatase: 65 U/L (ref 40–150)
BUN: 24.1 mg/dL (ref 7.0–26.0)
CALCIUM: 8.8 mg/dL (ref 8.4–10.4)
CHLORIDE: 105 meq/L (ref 98–109)
CO2: 23 mEq/L (ref 22–29)
CREATININE: 1.4 mg/dL — AB (ref 0.6–1.1)
EGFR: 46 mL/min/{1.73_m2} — ABNORMAL LOW (ref >60)
Glucose: 111 mg/dl (ref 70–140)
Potassium: 3.7 mEq/L (ref 3.5–5.1)
Sodium: 141 mEq/L (ref 136–145)
Total Bilirubin: 0.4 mg/dL (ref 0.20–1.20)
Total Protein: 6.4 g/dL (ref 6.4–8.3)

## 2017-03-08 LAB — CBC WITH DIFFERENTIAL/PLATELET
BASO%: 0 % (ref 0.0–2.0)
Basophils Absolute: 0 10*3/uL (ref 0.0–0.1)
EOS ABS: 0 10*3/uL (ref 0.0–0.5)
EOS%: 0.1 % (ref 0.0–7.0)
HEMATOCRIT: 35.7 % (ref 34.8–46.6)
HEMOGLOBIN: 11.3 g/dL — AB (ref 11.6–15.9)
LYMPH#: 1.4 10*3/uL (ref 0.9–3.3)
LYMPH%: 5.7 % — AB (ref 14.0–49.7)
MCH: 31.8 pg (ref 25.1–34.0)
MCHC: 31.7 g/dL (ref 31.5–36.0)
MCV: 100.6 fL (ref 79.5–101.0)
MONO#: 0.8 10*3/uL (ref 0.1–0.9)
MONO%: 3.3 % (ref 0.0–14.0)
NEUT%: 90.9 % — AB (ref 38.4–76.8)
NEUTROS ABS: 22.3 10*3/uL — AB (ref 1.5–6.5)
PLATELETS: 191 10*3/uL (ref 145–400)
RBC: 3.55 10*6/uL — ABNORMAL LOW (ref 3.70–5.45)
RDW: 14.7 % — ABNORMAL HIGH (ref 11.2–14.5)
WBC: 24.6 10*3/uL — AB (ref 3.9–10.3)
nRBC: 0 % (ref 0–0)

## 2017-03-08 MED ORDER — CEPHALEXIN 500 MG PO CAPS: 500.0000 mg | ORAL_CAPSULE | Freq: Three times a day (TID) | ORAL | 0 refills | Status: DC

## 2017-03-08 NOTE — Progress Notes (Signed)
[place] [facility] OFFICE PROGRESS NOTE  Melanie Low, MD [place] [address] [place] [address]  DIAGNOSIS:  1) Metastatic renal cell carcinoma, clear cell type, WHO nuclear grade 3 initially diagnosed in September 2010 and recently presented with bilateral pulmonary nodules. 2) history of renal cell carcinoma status post right nephrectomy in September 2010.  PRIOR THERAPY: Stereotactic radiotherapy to pulmonary nodules based on the metabolic activity with no tissue diagnosis. This was performed under the care of Dr. Pablo Ledger.  CURRENT THERAPY: first line treatment with immunotherapy with Nivolumab 3 MG/KG and Ipilumumab 1 mg/Kg every 3 weeks for 4 cycles followed by maintenance Nivolumab every 3 weeks. First dose 12/27/2016.  Status post 3 cycles.  INTERVAL HISTORY: Melanie Best 71 y.o. female returns for routine follow-up visit accompanied by her husband.  The patient is feeling well today and has no specific complaints except for swelling to her upper lip with a small lesion just below her nose.  She was seen by her primary care provider for this earlier this week and was placed on mupirocin and Aspercreme with lidocaine.  She has not really noticed much improvement.  She states that she is been putting ice on it which helps swelling.  It is not currently draining but she did notice a white bump that did open up at one point in time and drained some purulent material.  The patient's rash and itching are much better.  She is currently taking 50 mg of prednisone daily.  Her last treatment was held due to the itching and the rash.  She denies fevers and chills.  Denies chest pain, cough, hemoptysis.  She has her baseline shortness of breath and wears home oxygen.  Denies nausea, vomiting, constipation, diarrhea.  The patient had a recent CT scan of the chest, abdomen, pelvis and is here to discuss the results.  MEDICAL HISTORY: Past Medical History:  Diagnosis Date  .  Anemia   . Anxiety   . Arthritis    R leg  . Cancer ([facility])    kidney  . Chronic fatigue 12/13/2016  . CKD (chronic kidney disease), stage II    removed R kidney- 2010- for Cancer, followed by Dr. Diona Fanti  . COPD (chronic obstructive pulmonary disease) ([facility])    uses O2-2 liters , continuously , followed by Dr. Chase Caller  . Cough 08-02-2015  . Diabetes mellitus, type 2 ([facility])   . DM (diabetes mellitus) ([facility]) 01/27/2012  . DVT (deep venous thrombosis), left    2011- treated /w coumadin   . Dyslipidemia   . Dysrhythmia   . Esophageal reflux   . GERD (gastroesophageal reflux disease) 01/27/2012  . Glaucoma   . Goals of care, counseling/discussion 12/13/2016  . Hemoptysis 08-02-15  . History of radiation therapy 01/09/12,01/11/12,01/15/12,01/17/12,& 01/19/12   rul lung,50Gy/23f  . HTN (hypertension) 01/27/2012  . Hyperlipidemia 01/27/2012  . Hypertension    sees Dr. [place] PCP, denies ever having stress or ECHO, ?when & where she had  last  EKG  . Leukocytosis   . Lung cancer ([facility]    NSCLC  . Osteopenia   . Renal cell carcinoma of right kidney ([facility] 12/13/2016  . Shortness of breath   . Syncope   . Tremor   . Urinary incontinence   . Vitamin D deficiency     ALLERGIES:  has No Known Allergies.  MEDICATIONS:  Current Outpatient Medications  Medication Sig Dispense Refill  . aspirin 81 MG tablet Take 81 mg  by mouth daily.    . bimatoprost (LUMIGAN) 0.01 % SOLN Place 1 drop into both eyes at bedtime.     . Blood Glucose Monitoring Suppl (ONE TOUCH ULTRA SYSTEM KIT) w/Device KIT 50 kits by Does not apply route once. Testing as directed    . brinzolamide (AZOPT) 1 % ophthalmic suspension Place 1 drop into both eyes 3 (three) times daily. Reported on 08/12/2015    . cephALEXin (KEFLEX) 500 MG capsule Take 1 capsule (500 mg total) by mouth 3 (three) times daily. 30 capsule 0  . clonazePAM (KLONOPIN) 1 MG tablet Take 1 mg by mouth 2 (two) times daily as needed. For anxiety    .  dexamethasone (DECADRON) 4 MG tablet Take 1 tablet (4 mg total) by mouth daily. 5 tablet 0  . diltiazem (CARDIZEM CD) 180 MG 24 hr capsule Take 180 mg by mouth daily.  0  . ergocalciferol (VITAMIN D2) 50000 UNITS capsule Take 50,000 Units by mouth once a week. Take on Monday    . esomeprazole (NEXIUM) 40 MG capsule Take 40 mg by mouth daily before breakfast.     . furosemide (LASIX) 20 MG tablet Take 20 mg by mouth daily as needed. For fluid retention    . hydrOXYzine (ATARAX/VISTARIL) 25 MG tablet Take 25 mg by mouth every 8 (eight) hours as needed.    Marland Kitchen ipratropium-albuterol (DUONEB) 0.5-2.5 (3) MG/3ML SOLN Take 3 mLs by nebulization 4 (four) times daily as needed. 360 mL 4  . JANUVIA 50 MG tablet Take 50 mg by mouth daily.     Marland Kitchen lidocaine-prilocaine (EMLA) cream Apply 1 application topically as needed. Apply small amount on a cotton ball and apply to port site. Apply 1-2 hours prior to labs or chemo 30 g 0  . lovastatin (MEVACOR) 20 MG tablet Take 20 mg by mouth at bedtime.    . methocarbamol (ROBAXIN) 750 MG tablet Take 750 mg by mouth 2 (two) times daily as needed.  0  . Misc Natural Products (RED CLOVER COMBINATION PO) Take 800 mg by mouth 2 (two) times daily.    Marland Kitchen OVER THE COUNTER MEDICATION Take 406 mg by mouth daily.    . OXYGEN Inhale 2 L into the lungs continuous.    . predniSONE (DELTASONE) 1 MG tablet Take 4 mg by mouth every morning. Taking 4 50m  tablets  po daily  0  . predniSONE (DELTASONE) 10 MG tablet 7 tab QD x 7 days, 6 tab QD x 7 days, 5 tab QD x 7 days, 4 tab QD x 7 days, 3 tab QD x 7 days, 2 tab QD x 7 days, 1 tab QD x 7 days, 1/2 tab QD x 7 day 200 tablet 0  . primidone (MYSOLINE) 50 MG tablet Take 200 mg by mouth at bedtime.     . traMADol (ULTRAM) 50 MG tablet Take 1 tablet (50 mg total) by mouth 3 (three) times daily as needed. 21 tablet 0   No current facility-administered medications for this visit.     SURGICAL HISTORY:  Past Surgical History:  Procedure  Laterality Date  . BACK SURGERY     [facility] E[facility]GERY     cataracts removed, ?IOL  . FLEXIBLE BRONCHOSCOPY  11/29/2011  . IR FLUORO GUIDE PORT INSERTION RIGHT  01/30/2017  . IR [place] ACC[place] RIGHT  01/30/2017  . removed blood clot     left upper abd quad   .  right kidney removed    . right nephrectomy      REVIEW OF SYSTEMS:   Review of Systems  Constitutional: Negative for appetite change, chills, fatigue, fever and unexpected weight change.  HENT:   Negative for mouth sores, nosebleeds, sore throat and trouble swallowing.   Eyes: Negative for eye problems and icterus.  Respiratory: Negative for cough, hemoptysis, and wheezing.  She has her baseline shortness of breath and wears home oxygen. Cardiovascular: Negative for chest pain and leg swelling.  Gastrointestinal: Negative for abdominal pain, constipation, diarrhea, nausea and vomiting.  Genitourinary: Negative for bladder incontinence, difficulty urinating, dysuria, frequency and hematuria.   Musculoskeletal: Negative for back pain, gait problem, neck pain and neck stiffness.  Skin: Negative for itching and rash. She has a lesion just below her nose that is painful. Neurological: Negative for dizziness, extremity weakness, gait problem, headaches, light-headedness and seizures.  Hematological: Negative for adenopathy. Does not bruise/bleed easily.  Psychiatric/Behavioral: Negative for confusion, depression and sleep disturbance. The patient is not nervous/anxious.     PHYSICAL EXAMINATION:  Blood pressure 129/68, pulse 100, temperature 99 F (37.2 C), temperature source Oral, resp. rate 16, height _0  (1.651 m), weight 160 lb 3.2 oz (72.7 kg), SpO2 96 %.  ECOG PERFORMANCE STATUS: 1 - Symptomatic but completely ambulatory  Physical Exam  Constitutional: Oriented to person, place, and time and well-developed, well-nourished, and in no distress. No distress.  HENT:  Head:  Normocephalic and atraumatic.  Mouth/Throat: Oropharynx is clear and moist. No oropharyngeal exudate. She has a 0.3 cm sore just below her nose.  This area is not draining. Eyes: Conjunctivae are normal. Right eye exhibits no discharge. Left eye exhibits no discharge. No scleral icterus.  Neck: Normal range of motion. Neck supple.  Cardiovascular: Normal rate, regular rhythm, normal heart sounds and intact distal pulses.   Pulmonary/Chest: Effort normal and breath sounds normal. No respiratory distress. No wheezes. No rales.  Abdominal: Soft. Bowel sounds are normal. Exhibits no distension and no mass. There is no tenderness.  Musculoskeletal: Normal range of motion. Exhibits no edema.  Lymphadenopathy:    No cervical adenopathy.  Neurological: Alert and oriented to person, place, and time. Exhibits normal muscle tone. Coordination normal.  Skin: Skin is warm and dry. No rash noted. Not diaphoretic. No erythema. No pallor.  Psychiatric: Mood, memory and judgment normal.  Vitals reviewed.  LABORATORY DATA: Lab Results  Component Value Date   WBC 24.6 (H) 03/08/2017   HGB 11.3 (L) 03/08/2017   HCT 35.7 03/08/2017   MCV 100.6 03/08/2017   PLT 191 03/08/2017      Chemistry      Component Value Date/Time   NA 141 03/08/2017 1258   K 3.7 03/08/2017 1258   CL 102 01/30/2017 1207   CO2 23 03/08/2017 1258   BUN 24.1 03/08/2017 1258   CREATININE 1.4 (H) 03/08/2017 1258      Component Value Date/Time   CALCIUM 8.8 03/08/2017 1258   ALKPHOS 65 03/08/2017 1258   AST 8 03/08/2017 1258   ALT 20 03/08/2017 1258   BILITOT 0.40 03/08/2017 1258       RADIOGRAPHIC STUDIES:  Ct Abdomen Pelvis W Wo Contrast  Result Date: 03/02/2017 CLINICAL DATA:  Metastatic right renal cell carcinoma with a history of right nephrectomy in 2010. Restaging. EXAM: CT CHEST WITH CONTRAST CT ABDOMEN AND PELVIS WITH AND WITHOUT CONTRAST TECHNIQUE: Multidetector CT imaging of the chest was performed during  intravenous contrast administration. Multidetector CT imaging of  the abdomen and pelvis was performed following the standard protocol before and during bolus administration of intravenous contrast. CONTRAST:  123m ISOVUE-300 IOPAMIDOL (ISOVUE-300) INJECTION 61% COMPARISON:  11/15/2016 chest CT. 04/12/2016 PET-CT. 02/28/2016 CT chest and abdomen. FINDINGS: CT CHEST FINDINGS Cardiovascular: Normal heart size. No significant pericardial fluid/thickening. Right internal jugular MediPort terminates at the cavoatrial junction. Left anterior descending coronary atherosclerosis. Atherosclerotic nonaneurysmal thoracic aorta. Normal caliber pulmonary arteries. No central pulmonary emboli. Mediastinum/Nodes: No discrete thyroid nodules. Unremarkable esophagus. No pathologically enlarged axillary, mediastinal or hilar lymph nodes. Lungs/Pleura: No pneumothorax. No pleural effusion. Stable postsurgical scarring in the posterior right upper lobe and anterior left lower lobe. Severe centrilobular emphysema with diffuse bronchial wall thickening. No acute consolidative airspace disease or lung masses. All of the previously visualized scattered solid pulmonary nodules in the lower lobes have decreased in size. For example, a 0.9 cm right lower lobe nodule (series 8/ image 81), previously 1.2 cm, decreased. Left lower lobe 0.5 cm nodule (series 8/ image 93), previously 1.1 cm, decreased. Separate left lower lobe 1.3 cm nodule (series 8/ image 100), previously 1.5 cm, minimally decreased. Tiny scattered subpleural 3-4 mm bilateral upper lobe pulmonary nodules are stable and probably benign. No new significant pulmonary nodules. Musculoskeletal: No aggressive appearing focal osseous lesions. Mild thoracic spondylosis. CT ABDOMEN AND PELVIS FINDINGS Hepatobiliary: Top-normal liver size. Tiny hypodense 0.3 cm segment 4 left liver lobe lesion (series 4/ image 91), too small to characterize, probably stable on the prior noncontrast CT  images. No additional liver lesions. Layering hyperdense sludge versus tiny gallstones in the otherwise normal gallbladder. No biliary ductal dilatation. Pancreas: Heterogeneous fatty infiltration of the pancreas. No pancreatic mass or duct dilation. Spleen: Normal size. No mass. Adrenals/Urinary Tract: Normal adrenals. Right nephrectomy, with no mass in the right nephrectomy bed. There is an avidly enhancing poorly marginated 5.6 x 4.8 cm renal cortical mass in the lower left kidney (series 4/ image 118), new since 06/13/2012 contrast-enhanced CT abdomen study and not clearly delineated on the noncontrast CT images from 04/12/2016 PET-CT. There is early tumor thrombus within a peripheral branch of the left renal vein in the lower left renal sinus (series 4/ image 113). Otherwise patent left renal vein. Subcentimeter hyperdense renal cortical lesion in the anterior interpolar left kidney (series 2/ image 45), too small to characterize, probably a benign hemorrhagic/proteinaceous renal cyst given homogeneity. Scattered small simple renal cysts in the left kidney, largest 1.8 cm in the posterior interpolar left kidney. Numerous subcentimeter hypodense renal cortical lesions scattered in the left kidney, too small to characterize. No left hydronephrosis. Punctate nonobstructing lower left renal stone. Normal bladder. Stomach/Bowel: Grossly normal stomach. Normal caliber small bowel with no small bowel wall thickening. Normal appendix. Normal large bowel with no diverticulosis, large bowel wall thickening or pericolonic fat stranding. Vascular/Lymphatic: Atherosclerotic nonaneurysmal abdominal aorta. Patent hepatic, portal and splenic veins. No pathologically enlarged lymph nodes in the abdomen or pelvis. Reproductive: Grossly normal uterus.  No adnexal mass. Other: No pneumoperitoneum, ascites or focal fluid collection. Musculoskeletal: No aggressive appearing focal osseous lesions. Marked lumbar spondylosis.  IMPRESSION: 1. New avidly enhancing poorly marginated 5.6 cm renal cortical mass in the lower left kidney, compatible with a new primary clear cell renal cell carcinoma. Early tumor thrombus within a peripheral branch of the left renal vein in the lower left renal sinus. 2. No evidence of local tumor recurrence in the right nephrectomy bed. 3. Pulmonary metastases have decreased in size since 11/07/2016. No new or progressive metastatic  disease in the chest, abdomen or pelvis. 4. Chronic findings include: Aortic Atherosclerosis (ICD10-I70.0) and Emphysema (ICD10-J43.9). Coronary atherosclerosis. Electronically Signed   By: Ilona Sorrel M.D.   On: 03/02/2017 17:27   Ct Chest W Contrast  Result Date: 03/02/2017 CLINICAL DATA:  Metastatic right renal cell carcinoma with a history of right nephrectomy in 2010. Restaging. EXAM: CT CHEST WITH CONTRAST CT ABDOMEN AND PELVIS WITH AND WITHOUT CONTRAST TECHNIQUE: Multidetector CT imaging of the chest was performed during intravenous contrast administration. Multidetector CT imaging of the abdomen and pelvis was performed following the standard protocol before and during bolus administration of intravenous contrast. CONTRAST:  154m ISOVUE-300 IOPAMIDOL (ISOVUE-300) INJECTION 61% COMPARISON:  11/15/2016 chest CT. 04/12/2016 PET-CT. 02/28/2016 CT chest and abdomen. FINDINGS: CT CHEST FINDINGS Cardiovascular: Normal heart size. No significant pericardial fluid/thickening. Right internal jugular MediPort terminates at the cavoatrial junction. Left anterior descending coronary atherosclerosis. Atherosclerotic nonaneurysmal thoracic aorta. Normal caliber pulmonary arteries. No central pulmonary emboli. Mediastinum/Nodes: No discrete thyroid nodules. Unremarkable esophagus. No pathologically enlarged axillary, mediastinal or hilar lymph nodes. Lungs/Pleura: No pneumothorax. No pleural effusion. Stable postsurgical scarring in the posterior right upper lobe and anterior left  lower lobe. Severe centrilobular emphysema with diffuse bronchial wall thickening. No acute consolidative airspace disease or lung masses. All of the previously visualized scattered solid pulmonary nodules in the lower lobes have decreased in size. For example, a 0.9 cm right lower lobe nodule (series 8/ image 81), previously 1.2 cm, decreased. Left lower lobe 0.5 cm nodule (series 8/ image 93), previously 1.1 cm, decreased. Separate left lower lobe 1.3 cm nodule (series 8/ image 100), previously 1.5 cm, minimally decreased. Tiny scattered subpleural 3-4 mm bilateral upper lobe pulmonary nodules are stable and probably benign. No new significant pulmonary nodules. Musculoskeletal: No aggressive appearing focal osseous lesions. Mild thoracic spondylosis. CT ABDOMEN AND PELVIS FINDINGS Hepatobiliary: Top-normal liver size. Tiny hypodense 0.3 cm segment 4 left liver lobe lesion (series 4/ image 91), too small to characterize, probably stable on the prior noncontrast CT images. No additional liver lesions. Layering hyperdense sludge versus tiny gallstones in the otherwise normal gallbladder. No biliary ductal dilatation. Pancreas: Heterogeneous fatty infiltration of the pancreas. No pancreatic mass or duct dilation. Spleen: Normal size. No mass. Adrenals/Urinary Tract: Normal adrenals. Right nephrectomy, with no mass in the right nephrectomy bed. There is an avidly enhancing poorly marginated 5.6 x 4.8 cm renal cortical mass in the lower left kidney (series 4/ image 118), new since 06/13/2012 contrast-enhanced CT abdomen study and not clearly delineated on the noncontrast CT images from 04/12/2016 PET-CT. There is early tumor thrombus within a peripheral branch of the left renal vein in the lower left renal sinus (series 4/ image 113). Otherwise patent left renal vein. Subcentimeter hyperdense renal cortical lesion in the anterior interpolar left kidney (series 2/ image 45), too small to characterize, probably a benign  hemorrhagic/proteinaceous renal cyst given homogeneity. Scattered small simple renal cysts in the left kidney, largest 1.8 cm in the posterior interpolar left kidney. Numerous subcentimeter hypodense renal cortical lesions scattered in the left kidney, too small to characterize. No left hydronephrosis. Punctate nonobstructing lower left renal stone. Normal bladder. Stomach/Bowel: Grossly normal stomach. Normal caliber small bowel with no small bowel wall thickening. Normal appendix. Normal large bowel with no diverticulosis, large bowel wall thickening or pericolonic fat stranding. Vascular/Lymphatic: Atherosclerotic nonaneurysmal abdominal aorta. Patent hepatic, portal and splenic veins. No pathologically enlarged lymph nodes in the abdomen or pelvis. Reproductive: Grossly normal uterus.  No adnexal  mass. Other: No pneumoperitoneum, ascites or focal fluid collection. Musculoskeletal: No aggressive appearing focal osseous lesions. Marked lumbar spondylosis. IMPRESSION: 1. New avidly enhancing poorly marginated 5.6 cm renal cortical mass in the lower left kidney, compatible with a new primary clear cell renal cell carcinoma. Early tumor thrombus within a peripheral branch of the left renal vein in the lower left renal sinus. 2. No evidence of local tumor recurrence in the right nephrectomy bed. 3. Pulmonary metastases have decreased in size since 11/07/2016. No new or progressive metastatic disease in the chest, abdomen or pelvis. 4. Chronic findings include: Aortic Atherosclerosis (ICD10-I70.0) and Emphysema (ICD10-J43.9). Coronary atherosclerosis. Electronically Signed   By: Ilona Sorrel M.D.   On: 03/02/2017 17:27     ASSESSMENT/PLAN:  Renal cell carcinoma This is a very pleasant 71 year old African-American female with metastatic renal cell carcinoma that was initially diagnosed in 2010 and now presenting with bilateral pulmonary nodules. She was treated with stereotactic radiotherapy to hypermetabolic  nodules without tissue diagnosis in the past. Recent imaging studies showed hypermetabolic and enlarging bilateral pulmonary nodules. CT-guided core biopsy of right pulmonary nodule was consistent with metastatic renal cell carcinoma. The patient is currently undergoing treatment with immunotherapy with ipilimumab and Nivolumab status post 3 cycles. She has been tolerating this treatment well except for severe itching and treatment was placed on hold.  Itching has improved since being placed on prednisone.  Current dose of prednisone is 50 mg daily.  She will continue to taper her prednisone as previously directed.  The patient was seen with Dr. Julien Nordmann.  CT scan results were discussed with the patient and her husband.  Explained that the lesions in the lungs have improved.  However, there is a 5.6 cm renal cortical mass in the lower left kidney which was not apparent before.  It is unclear whether this area was there before and not well-defined or if it is a new area.  Recommend that she restart treatment when she gets down to 10 mg of prednisone daily with Nivolumab only.  We will plan to see the patient back in mid January to resume her treatment when her dose of prednisone should be down to 10 mg.  For the lesion on her upper lip, this area does not look like shingles.  She was given a prescription for Keflex 500 mg 3 times daily.  The patient was advised to call immediately if she has any other concerning symptoms in the interval. The patient voices understanding of current disease status and treatment options and is in agreement with the current care plan. All questions were answered. The patient knows to call the clinic with any problems, questions or concerns. We can certainly see the patient much sooner if necessary.  No orders of the defined types were placed in this encounter.  Mikey Bussing, DNP, AGPCNP-BC, AOCNP 03/09/17  ADDENDUM: Hematology/Oncology Attending: I had a  face-to-face encounter with the patient.  I recommended her care plan.  This is a very pleasant 71 years old African-American female with metastatic renal cell carcinoma that was initially diagnosed in 2010 and recently presented with bilateral pulmonary nodules that was biopsy-proven to be metastatic renal cell carcinoma.  She is currently undergoing treatment with immunotherapy with ipilimumab and Nivolumab status post 3 cycles.  Her treatment is currently on hold secondary to significant pruritus requiring taper dose of prednisone.  She is currently on 50 mg p.o. daily.  Her itching has significantly improved. The patient had repeat CT scan  of the chest, abdomen and pelvis performed recently.  Her scan showed improvement in the bilateral pulmonary nodules but there was a new mass in the left kidney that was not seen on the previous imaging studies.  Unfortunately it is not clear if this mass has been there and not detected by the PET scan or new lesion. I recommended for the patient to continue with a taper dose of prednisone for now. We will see her back for follow-up visit in 4 weeks for reevaluation and discussion of her treatment options.  This may include treatment with single agent immunotherapy was Nivolumab versus switching her treatment to tyrosine kinase inhibitor like Votrient. The patient and her husband are in agreement with the current plan. She was advised to call immediately if she has any concerning symptoms in the interval.  Disclaimer: This note was dictated with voice recognition software. Similar sounding words can inadvertently be transcribed and may be missed upon review. Eilleen Kempf, MD 03/10/17

## 2017-03-08 NOTE — Telephone Encounter (Signed)
Gave avs and calendar for January and February 2019

## 2017-03-09 ENCOUNTER — Encounter: Payer: Self-pay | Admitting: Oncology

## 2017-03-09 NOTE — Assessment & Plan Note (Signed)
This is a very pleasant 71 year old African-American female with metastatic renal cell carcinoma that was initially diagnosed in 2010 and now presenting with bilateral pulmonary nodules. She was treated with stereotactic radiotherapy to hypermetabolic nodules without tissue diagnosis in the past. Recent imaging studies showed hypermetabolic and enlarging bilateral pulmonary nodules. CT-guided core biopsy of right pulmonary nodule was consistent with metastatic renal cell carcinoma. The patient is currently undergoing treatment with immunotherapy with ipilimumab and Nivolumab status post 3 cycles. She has been tolerating this treatment well except for severe itching and treatment was placed on hold.  Itching has improved since being placed on prednisone.  Current dose of prednisone is 50 mg daily.  She will continue to taper her prednisone as previously directed.  The patient was seen with Dr. Julien Nordmann.  CT scan results were discussed with the patient and her husband.  Explained that the lesions in the lungs have improved.  However, there is a 5.6 cm renal cortical mass in the lower left kidney which was not apparent before.  It is unclear whether this area was there before and not well-defined or if it is a new area.  Recommend that she restart treatment when she gets down to 10 mg of prednisone daily with Nivolumab only.  We will plan to see the patient back in mid January to resume her treatment when her dose of prednisone should be down to 10 mg.  For the lesion on her upper lip, this area does not look like shingles.  She was given a prescription for Keflex 500 mg 3 times daily.  The patient was advised to call immediately if she has any other concerning symptoms in the interval. The patient voices understanding of current disease status and treatment options and is in agreement with the current care plan. All questions were answered. The patient knows to call the clinic with any problems,  questions or concerns. We can certainly see the patient much sooner if necessary.

## 2017-03-14 ENCOUNTER — Telehealth: Payer: Self-pay | Admitting: Medical Oncology

## 2017-03-14 NOTE — Telephone Encounter (Signed)
I returned pt call. She reports he lip is better and she can wear her dentures -she started antibiotic last wed.

## 2017-03-21 ENCOUNTER — Other Ambulatory Visit: Payer: Self-pay | Admitting: Nurse Practitioner

## 2017-03-21 ENCOUNTER — Ambulatory Visit: Payer: Medicare Other

## 2017-03-21 ENCOUNTER — Telehealth: Payer: Self-pay | Admitting: *Deleted

## 2017-03-21 ENCOUNTER — Ambulatory Visit: Payer: Medicare Other | Admitting: Oncology

## 2017-03-21 ENCOUNTER — Ambulatory Visit
Admission: RE | Admit: 2017-03-21 | Discharge: 2017-03-21 | Disposition: A | Discharge status: Home / Self Care | Payer: Medicare Other | Source: Ambulatory Visit | Attending: Nurse Practitioner | Admitting: Nurse Practitioner

## 2017-03-21 ENCOUNTER — Other Ambulatory Visit: Payer: Medicare Other

## 2017-03-21 DIAGNOSIS — R1030 Lower abdominal pain, unspecified: Secondary | ICD-10-CM

## 2017-03-21 NOTE — Telephone Encounter (Signed)
Pt called and left message requesting a call back from nurse.  Spoke with pt and was informed that pt has had nausea, and abdominal pain every time pt eats.  Stated has Phenergan but took only 1 dose with relief.  Pt vomited x 1 this am of eaten food.  Instructed pt to take Phenergan as prescribed. Pt also stated having abdominal pain every time she eats.  Stated taking Tylenol with little relief.   Asked pt if she contacted her PCP for Tramadol refills.  Pt stated she did a week ago, and has not heard back from office yet.  Instructed pt to call PCP office again for Tramadol refill.  Pt voiced understanding. Pt's   Phone    [phone].

## 2017-04-05 ENCOUNTER — Inpatient Hospital Stay: Payer: Medicare Other

## 2017-04-05 ENCOUNTER — Inpatient Hospital Stay: Payer: Medicare Other | Attending: Internal Medicine

## 2017-04-05 ENCOUNTER — Inpatient Hospital Stay (HOSPITAL_BASED_OUTPATIENT_CLINIC_OR_DEPARTMENT_OTHER): Payer: Medicare Other | Admitting: Oncology

## 2017-04-05 ENCOUNTER — Encounter: Payer: Self-pay | Admitting: Oncology

## 2017-04-05 ENCOUNTER — Telehealth: Payer: Self-pay | Admitting: Internal Medicine

## 2017-04-05 VITALS — BP 129/72 | HR 96 | Temp 97.7°F | Resp 18 | Ht 65.0 in | Wt 156.8 lb

## 2017-04-05 DIAGNOSIS — C641 Malignant neoplasm of right kidney, except renal pelvis: Secondary | ICD-10-CM | POA: Diagnosis present

## 2017-04-05 DIAGNOSIS — Z95828 Presence of other vascular implants and grafts: Secondary | ICD-10-CM

## 2017-04-05 DIAGNOSIS — C7801 Secondary malignant neoplasm of right lung: Secondary | ICD-10-CM

## 2017-04-05 DIAGNOSIS — R5382 Chronic fatigue, unspecified: Secondary | ICD-10-CM

## 2017-04-05 DIAGNOSIS — Z5112 Encounter for antineoplastic immunotherapy: Secondary | ICD-10-CM

## 2017-04-05 LAB — CBC WITH DIFFERENTIAL/PLATELET
Basophils Absolute: 0 10*3/uL (ref 0.0–0.1)
Basophils Relative: 0 %
Eosinophils Absolute: 0 10*3/uL (ref 0.0–0.5)
Eosinophils Relative: 0 %
HCT: 31.9 % — ABNORMAL LOW (ref 34.8–46.6)
Hemoglobin: 9.6 g/dL — ABNORMAL LOW (ref 11.6–15.9)
Lymphocytes Relative: 11 %
Lymphs Abs: 1.6 10*3/uL (ref 0.9–3.3)
MCH: 31.4 pg (ref 25.1–34.0)
MCHC: 30.1 g/dL — ABNORMAL LOW (ref 31.5–36.0)
MCV: 104.2 fL — ABNORMAL HIGH (ref 79.5–101.0)
Monocytes Absolute: 0.5 10*3/uL (ref 0.1–0.9)
Monocytes Relative: 4 %
Neutro Abs: 12 10*3/uL — ABNORMAL HIGH (ref 1.5–6.5)
Neutrophils Relative %: 85 %
Platelets: 211 10*3/uL (ref 145–400)
RBC: 3.06 MIL/uL — ABNORMAL LOW (ref 3.70–5.45)
RDW: 14.8 % (ref 11.2–16.1)
WBC: 14.1 10*3/uL — ABNORMAL HIGH (ref 3.9–10.3)

## 2017-04-05 LAB — COMPREHENSIVE METABOLIC PANEL
ALT: 16 U/L (ref 0–55)
ANION GAP: 12 — AB (ref 3–11)
AST: 10 U/L (ref 5–34)
Albumin: 3 g/dL — ABNORMAL LOW (ref 3.5–5.0)
Alkaline Phosphatase: 82 U/L (ref 40–150)
BUN: 15 mg/dL (ref 7–26)
CO2: 25 mmol/L (ref 22–29)
Calcium: 9.1 mg/dL (ref 8.4–10.4)
Chloride: 108 mmol/L (ref 98–109)
Creatinine, Ser: 1.37 mg/dL — ABNORMAL HIGH (ref 0.60–1.10)
GFR calc Af Amer: 44 mL/min — ABNORMAL LOW (ref >60)
GFR calc non Af Amer: 38 mL/min — ABNORMAL LOW (ref >60)
Glucose, Bld: 172 mg/dL — ABNORMAL HIGH (ref 70–140)
POTASSIUM: 4 mmol/L (ref 3.3–4.7)
Sodium: 145 mmol/L (ref 136–145)
TOTAL PROTEIN: 6.7 g/dL (ref 6.4–8.3)

## 2017-04-05 LAB — TSH: TSH: 0.635 u[IU]/mL (ref 0.308–3.960)

## 2017-04-05 MED ORDER — SODIUM CHLORIDE 0.9% FLUSH
10.0000 mL | INTRAVENOUS | Status: DC | PRN
  Administered 2017-04-05: 10 mL via INTRAVENOUS
  Filled 2017-04-05: qty 10

## 2017-04-05 NOTE — Progress Notes (Signed)
[facility] OFFICE PROGRESS NOTE  [name], MD [address] [place] [address]  DIAGNOSIS:  1) Metastatic renal cell carcinoma, clear cell type, WHO nuclear grade 3 initially diagnosed in September 2010 and recently presented with bilateral pulmonary nodules. 2) history of renal cell carcinoma status post right nephrectomy in September 2010.  PRIOR THERAPY: 1) Stereotactic radiotherapy to pulmonary nodules based on the metabolic activity with no tissue diagnosis. This was performed under the care of Dr. Pablo Ledger. 2) first line treatment with immunotherapy with Nivolumab 3 MG/KG and Ipilumumab 1 mg/Kg every 3 weeks for 4 cycles followed by maintenance Nivolumab every 3 weeks. First dose 12/27/2016. Status post 3cycles.  CURRENT THERAPY: The patient will begin Nivolumab as a single agent on 05/03/2017.  This will be given every 4 weeks.  INTERVAL HISTORY: Melanie Best 72 y.o. female returns for routine follow-up visit accompanied by her husband.  The patient is feeling fine today has no specific complaints.  She continue to taper her prednisone which was given to her for itching and rash secondary to her treatment.  She is currently taking 10 mg of prednisone daily.  She denies any itching or rash at this time.  Denies fevers and chills.  Denies chest pain, shortness of breath, cough, hemoptysis.  Denies nausea, vomiting, constipation, diarrhea.  The patient is scheduled for treatment today and reports that she does not want to be treated because she is going on vacation in early February does not want to feel bad on her trip.  She requests to start single agent Nivolumab on 05/03/2017.  The patient is here for evaluation prior to cycle #1 of single agent Nivolumab.  MEDICAL HISTORY: Past Medical History:  Diagnosis Date  . Anemia   . Anxiety   . Arthritis    R leg  . Cancer ([place])    kidney  . Chronic fatigue 12/13/2016  . CKD (chronic kidney  disease), stage II    removed R kidney- 2010- for Cancer, followed by Dr. Diona Fanti  . COPD (chronic obstructive pulmonary disease) ([facility])    uses O2-2 liters , continuously , followed by Dr. Chase Caller  . Cough 08-02-2015  . Diabetes mellitus, type 2 ([facility])   . DM (diabetes mellitus) ([place]) 01/27/2012  . DVT (deep venous thrombosis), left    2011- treated /w coumadin   . Dyslipidemia   . Dysrhythmia   . Esophageal reflux   . GERD (gastroesophageal reflux disease) 01/27/2012  . Glaucoma   . Goals of care, counseling/discussion 12/13/2016  . Hemoptysis 08-02-15  . History of radiation therapy 01/09/12,01/11/12,01/15/12,01/17/12,& 01/19/12   rul lung,50Gy/2f  . HTN (hypertension) 01/27/2012  . Hyperlipidemia 01/27/2012  . Hypertension    sees Dr. [place] [place] PCP, denies ever having stress or ECHO, ?when & where she had  last  EKG  . Leukocytosis   . Lung cancer ([facility]    NS[facility]  . Osteopenia   . [place] 12/13/2016  . Shortness of breath   . Syncope   . Tremor   . Urinary incontinence   . Vitamin D deficiency     ALLERGIES:  has No Known Allergies.  MEDICATIONS:  Current Outpatient Medications  Medication Sig Dispense Refill  . aspirin 81 MG tablet Take 81 mg by mouth daily.    . bimatoprost (LUMIGAN) 0.01 % SOLN Place 1 drop into both eyes at bedtime.     . Blood Glucose Monitoring Suppl (ONE  TOUCH ULTRA SYSTEM KIT) w/Device KIT 50 kits by Does not apply route once. Testing as directed    . brinzolamide (AZOPT) 1 % ophthalmic suspension Place 1 drop into both eyes 3 (three) times daily. Reported on 08/12/2015    . cephALEXin (KEFLEX) 500 MG capsule Take 1 capsule (500 mg total) by mouth 3 (three) times daily. 30 capsule 0  . clonazePAM (KLONOPIN) 1 MG tablet Take 1 mg by mouth 2 (two) times daily as needed. For anxiety    . dexamethasone (DECADRON) 4 MG tablet Take 1 tablet (4 mg total) by mouth daily. 5 tablet 0  . diltiazem (CARDIZEM CD) 180 MG 24 hr  capsule Take 180 mg by mouth daily.  0  . ergocalciferol (VITAMIN D2) 50000 UNITS capsule Take 50,000 Units by mouth once a week. Take on Monday    . esomeprazole (NEXIUM) 40 MG capsule Take 40 mg by mouth daily before breakfast.     . furosemide (LASIX) 20 MG tablet Take 20 mg by mouth daily as needed. For fluid retention    . hydrocortisone 2.5 % cream APPLY TO RASH 4 TIMES DAILY AS NEEDED  0  . hydrOXYzine (ATARAX/VISTARIL) 25 MG tablet Take 25 mg by mouth every 8 (eight) hours as needed.    . hydrOXYzine (ATARAX/VISTARIL) 50 MG tablet take 1 tablet by mouth every 8 hours if needed  0  . ipratropium-albuterol (DUONEB) 0.5-2.5 (3) MG/3ML SOLN Take 3 mLs by nebulization 4 (four) times daily as needed. 360 mL 4  . JANUVIA 50 MG tablet Take 50 mg by mouth daily.     Marland Kitchen lidocaine-prilocaine (EMLA) cream Apply 1 application topically as needed. Apply small amount on a cotton ball and apply to port site. Apply 1-2 hours prior to labs or chemo 30 g 0  . lovastatin (MEVACOR) 20 MG tablet Take 20 mg by mouth at bedtime.    . methocarbamol (ROBAXIN) 750 MG tablet Take 750 mg by mouth 2 (two) times daily as needed.  0  . Misc Natural Products (RED CLOVER COMBINATION PO) Take 800 mg by mouth 2 (two) times daily.    Marland Kitchen OVER THE COUNTER MEDICATION Take 406 mg by mouth daily.    . OXYGEN Inhale 2 L into the lungs continuous.    . predniSONE (DELTASONE) 1 MG tablet Take 4 mg by mouth every morning. Taking 4 73m  tablets  po daily  0  . predniSONE (DELTASONE) 10 MG tablet 7 tab QD x 7 days, 6 tab QD x 7 days, 5 tab QD x 7 days, 4 tab QD x 7 days, 3 tab QD x 7 days, 2 tab QD x 7 days, 1 tab QD x 7 days, 1/2 tab QD x 7 day 200 tablet 0  . primidone (MYSOLINE) 50 MG tablet Take 200 mg by mouth at bedtime.     . promethazine (PHENERGAN) 25 MG tablet take 1 tablet by mouth every 6 hours if needed for nausea  0  . traMADol (ULTRAM) 50 MG tablet Take 1 tablet (50 mg total) by mouth 3 (three) times daily as needed. 21  tablet 0  . mupirocin ointment (BACTROBAN) 2 % apply to affected area three times a day  0   No current facility-administered medications for this visit.     SURGICAL HISTORY:  Past Surgical History:  Procedure Laterality Date  . BACK SURGERY     [facility] . EYE SURGERY     cataracts  removed, ?IOL  . FLEXIBLE BRONCHOSCOPY  11/29/2011  . IR FLUORO GUIDE PORT INSERTION RIGHT  01/30/2017  . IR US GUIDE VASC ACCESS RIGHT  01/30/2017  . removed blood clot     left upper abd quad   . right kidney removed    . right nephrectomy      REVIEW OF SYSTEMS:   Review of Systems  Constitutional: Negative for appetite change, chills, fatigue, fever and unexpected weight change.  HENT:   Negative for mouth sores, nosebleeds, sore throat and trouble swallowing.   Eyes: Negative for eye problems and icterus.  Respiratory: Negative for cough, hemoptysis, shortness of breath and wheezing.   Cardiovascular: Negative for chest pain and leg swelling.  Gastrointestinal: Negative for abdominal pain, constipation, diarrhea, nausea and vomiting.  Genitourinary: Negative for bladder incontinence, difficulty urinating, dysuria, frequency and hematuria.   Musculoskeletal: Negative for back pain, gait problem, neck pain and neck stiffness.  Skin: Negative for itching and rash.  Neurological: Negative for dizziness, extremity weakness, gait problem, headaches, light-headedness and seizures.  Hematological: Negative for adenopathy. Does not bruise/bleed easily.  Psychiatric/Behavioral: Negative for confusion, depression and sleep disturbance. The patient is not nervous/anxious.     PHYSICAL EXAMINATION:  Blood pressure 129/72, pulse 96, temperature 97.7 F (36.5 C), temperature source Oral, resp. rate 18, height '5\' 5"'  (1.651 m), weight 156 lb 12.8 oz (71.1 kg), SpO2 97 %.  ECOG PERFORMANCE STATUS: 1 - Symptomatic but completely ambulatory  Physical Exam  Constitutional: Oriented  to person, place, and time and well-developed, well-nourished, and in no distress. No distress.  HENT:  Head: Normocephalic and atraumatic.  Mouth/Throat: Oropharynx is clear and moist. No oropharyngeal exudate.  Eyes: Conjunctivae are normal. Right eye exhibits no discharge. Left eye exhibits no discharge. No scleral icterus.  Neck: Normal range of motion. Neck supple.  Cardiovascular: Normal rate, regular rhythm, normal heart sounds and intact distal pulses.   Pulmonary/Chest: Effort normal and breath sounds normal. No respiratory distress. No wheezes. No rales.  Abdominal: Soft. Bowel sounds are normal. Exhibits no distension and no mass. There is no tenderness.  Musculoskeletal: Normal range of motion. Exhibits no edema.  Lymphadenopathy:    No cervical adenopathy.  Neurological: Alert and oriented to person, place, and time. Exhibits normal muscle tone.Coordination normal.  Skin: Skin is warm and dry. No rash noted. Not diaphoretic. No erythema. No pallor.  Psychiatric: Mood, memory and judgment normal.  Vitals reviewed.  LABORATORY DATA: Lab Results  Component Value Date   WBC 14.1 (H) 04/05/2017   HGB 9.6 (L) 04/05/2017   HCT 31.9 (L) 04/05/2017   MCV 104.2 (H) 04/05/2017   PLT 211 04/05/2017      Chemistry      Component Value Date/Time   NA 145 04/05/2017 1217   NA 141 03/08/2017 1258   K 4.0 04/05/2017 1217   K 3.7 03/08/2017 1258   CL 108 04/05/2017 1217   CO2 25 04/05/2017 1217   CO2 23 03/08/2017 1258   BUN 15 04/05/2017 1217   BUN 24.1 03/08/2017 1258   CREATININE 1.37 (H) 04/05/2017 1217   CREATININE 1.4 (H) 03/08/2017 1258      Component Value Date/Time   CALCIUM 9.1 04/05/2017 1217   CALCIUM 8.8 03/08/2017 1258   ALKPHOS 82 04/05/2017 1217   ALKPHOS 65 03/08/2017 1258   AST 10 04/05/2017 1217   AST 8 03/08/2017 1258   ALT 16 04/05/2017 1217   ALT 20 03/08/2017 1258   BILITOT <0.2 (L)  04/05/2017 1217   BILITOT 0.40 03/08/2017 1258        RADIOGRAPHIC STUDIES:  Dg Abd 1 View  Result Date: 03/21/2017 CLINICAL DATA:  Constipation and nausea and vomiting for the past 6 days. History of lung cancer and metastatic renal cancer. EXAM: ABDOMEN - 1 VIEW COMPARISON:  CT abdomen dated March 02, 2017. FINDINGS: Large stool burden in the right colon, transverse colon, and at the splenic flexure. No dilated loops of small bowel. Degenerative changes of the lower lumbar spine. Unchanged surgical clips in the midabdomen. IMPRESSION: 1. Large stool burden in the proximal colon to the level of the splenic flexure. No definite evidence of obstruction. Electronically Signed   By: Titus Dubin M.D.   On: 03/21/2017 15:51     ASSESSMENT/PLAN:  Renal cell carcinoma of right kidney [facility]) This is a very pleasant 72 year old African-American female with metastatic renal cell carcinoma that was initially diagnosed in 2010 and now presenting with bilateral pulmonary nodules. She was treated with stereotactic radiotherapy to hypermetabolic nodules without tissue diagnosis in the past. Imaging studies showed hypermetabolic and enlarging bilateral pulmonary nodules. CT-guided core biopsy of right pulmonary nodule was consistent with metastatic renal cell carcinoma. The patient is started treatment with immunotherapy with ipilimumab and Nivolumab status post3cycles. She has been tolerating this treatment well except for severe itching and treatment was placed on hold.  Itching has improved since being placed on prednisone.  Current dose of prednisone is 10 mg daily.  She will continue to taper her prednisone as previously directed. As outlined at her previous visit, recommend that she proceed with single agent Nivolumab every 4 weeks once her prednisone dose is down to 10 mg daily.  I recommended the patient proceed with treatment today as scheduled, but the patient prefers to wait until after her trip.  We will plan to bring her back on 05/03/2017  for evaluation prior to cycle 1 of single agent Nivolumab.  The patient was advised to call immediately if she has any other concerning symptoms in the interval. The patient voices understanding of current disease status and treatment options and is in agreement with the current care plan. All questions were answered. The patient knows to call the clinic with any problems, questions or concerns. We can certainly see the patient much sooner if necessary.  No orders of the defined types were placed in this encounter.   Mikey Bussing, DNP, AGPCNP-BC, AOCNP 04/06/17

## 2017-04-05 NOTE — Telephone Encounter (Signed)
Gave avs and calendar for February - april

## 2017-04-06 NOTE — Assessment & Plan Note (Signed)
This is a very pleasant 72 year old African-American female with metastatic renal cell carcinoma that was initially diagnosed in 2010 and now presenting with bilateral pulmonary nodules. She was treated with stereotactic radiotherapy to hypermetabolic nodules without tissue diagnosis in the past. Imaging studies showed hypermetabolic and enlarging bilateral pulmonary nodules. CT-guided core biopsy of right pulmonary nodule was consistent with metastatic renal cell carcinoma. The patient is started treatment with immunotherapy with ipilimumab and Nivolumab status post3cycles. She has been tolerating this treatment well except for severe itching and treatment was placed on hold.  Itching has improved since being placed on prednisone.  Current dose of prednisone is 10 mg daily.  She will continue to taper her prednisone as previously directed. As outlined at her previous visit, recommend that she proceed with single agent Nivolumab every 4 weeks once her prednisone dose is down to 10 mg daily.  I recommended the patient proceed with treatment today as scheduled, but the patient prefers to wait until after her trip.  We will plan to bring her back on 05/03/2017 for evaluation prior to cycle 1 of single agent Nivolumab.  The patient was advised to call immediately if she has any other concerning symptoms in the interval. The patient voices understanding of current disease status and treatment options and is in agreement with the current care plan. All questions were answered. The patient knows to call the clinic with any problems, questions or concerns. We can certainly see the patient much sooner if necessary.

## 2017-04-10 ENCOUNTER — Other Ambulatory Visit: Payer: Self-pay | Admitting: Internal Medicine

## 2017-04-11 ENCOUNTER — Other Ambulatory Visit: Payer: Self-pay | Admitting: Internal Medicine

## 2017-04-13 ENCOUNTER — Telehealth: Payer: Self-pay | Admitting: Internal Medicine

## 2017-04-13 NOTE — Telephone Encounter (Signed)
Left message for patient to call back. Do not see where an order was placed.

## 2017-04-16 NOTE — Telephone Encounter (Signed)
lmtcb x2 for pt. 

## 2017-04-17 NOTE — Telephone Encounter (Signed)
lmtcb x3 for pt. 

## 2017-04-18 NOTE — Telephone Encounter (Signed)
We have attempted to contact the pt several times with no success or call back from the pt. Per triage protocol, message will be closed.  

## 2017-04-19 ENCOUNTER — Telehealth: Payer: Self-pay | Admitting: Medical Oncology

## 2017-04-19 ENCOUNTER — Other Ambulatory Visit: Payer: Self-pay | Admitting: Medical Oncology

## 2017-04-19 DIAGNOSIS — L299 Pruritus, unspecified: Secondary | ICD-10-CM

## 2017-04-19 MED ORDER — METHYLPREDNISOLONE 4 MG PO TBPK: ORAL_TABLET | ORAL | 0 refills | Status: DC

## 2017-04-19 NOTE — Telephone Encounter (Signed)
Reports itching x 1 week ( she had just completed medrol dose pak. ) Per Mohamed Medrol dose pak escribed.

## 2017-04-26 ENCOUNTER — Inpatient Hospital Stay: Payer: Medicare Other

## 2017-04-26 ENCOUNTER — Other Ambulatory Visit: Payer: Medicare Other

## 2017-04-26 ENCOUNTER — Ambulatory Visit: Payer: Medicare Other | Admitting: Internal Medicine

## 2017-05-03 ENCOUNTER — Inpatient Hospital Stay: Payer: Medicare Other

## 2017-05-03 ENCOUNTER — Encounter: Payer: Self-pay | Admitting: Oncology

## 2017-05-03 ENCOUNTER — Inpatient Hospital Stay: Payer: Medicare Other | Attending: Internal Medicine | Admitting: Oncology

## 2017-05-03 VITALS — BP 116/59 | HR 99 | Temp 98.6°F | Resp 20 | Ht 65.0 in | Wt 156.0 lb

## 2017-05-03 DIAGNOSIS — Z5112 Encounter for antineoplastic immunotherapy: Secondary | ICD-10-CM | POA: Insufficient documentation

## 2017-05-03 DIAGNOSIS — C641 Malignant neoplasm of right kidney, except renal pelvis: Secondary | ICD-10-CM | POA: Insufficient documentation

## 2017-05-03 DIAGNOSIS — C649 Malignant neoplasm of unspecified kidney, except renal pelvis: Secondary | ICD-10-CM

## 2017-05-03 DIAGNOSIS — C7801 Secondary malignant neoplasm of right lung: Secondary | ICD-10-CM | POA: Diagnosis not present

## 2017-05-03 DIAGNOSIS — Z95828 Presence of other vascular implants and grafts: Secondary | ICD-10-CM

## 2017-05-03 DIAGNOSIS — C7802 Secondary malignant neoplasm of left lung: Secondary | ICD-10-CM

## 2017-05-03 DIAGNOSIS — R5382 Chronic fatigue, unspecified: Secondary | ICD-10-CM

## 2017-05-03 DIAGNOSIS — Z79899 Other long term (current) drug therapy: Secondary | ICD-10-CM | POA: Insufficient documentation

## 2017-05-03 LAB — CBC WITH DIFFERENTIAL/PLATELET
BASOS ABS: 0.1 10*3/uL (ref 0.0–0.1)
Basophils Relative: 1 %
EOS ABS: 0.2 10*3/uL (ref 0.0–0.5)
EOS PCT: 1 %
HCT: 30.2 % — ABNORMAL LOW (ref 34.8–46.6)
Hemoglobin: 9.5 g/dL — ABNORMAL LOW (ref 11.6–15.9)
LYMPHS PCT: 9 %
Lymphs Abs: 1.5 10*3/uL (ref 0.9–3.3)
MCH: 31.4 pg (ref 25.1–34.0)
MCHC: 31.6 g/dL (ref 31.5–36.0)
MCV: 99.4 fL (ref 79.5–101.0)
Monocytes Absolute: 0.6 10*3/uL (ref 0.1–0.9)
Monocytes Relative: 4 %
Neutro Abs: 13.1 10*3/uL — ABNORMAL HIGH (ref 1.5–6.5)
Neutrophils Relative %: 85 %
PLATELETS: 214 10*3/uL (ref 145–400)
RBC: 3.04 MIL/uL — AB (ref 3.70–5.45)
RDW: 16.1 % — ABNORMAL HIGH (ref 11.2–14.5)
WBC: 15.4 10*3/uL — AB (ref 3.9–10.3)

## 2017-05-03 LAB — COMPREHENSIVE METABOLIC PANEL
ALT: 13 U/L (ref 0–55)
AST: 9 U/L (ref 5–34)
Albumin: 3.1 g/dL — ABNORMAL LOW (ref 3.5–5.0)
Alkaline Phosphatase: 67 U/L (ref 40–150)
Anion gap: 12 — ABNORMAL HIGH (ref 3–11)
BILIRUBIN TOTAL: 0.2 mg/dL (ref 0.2–1.2)
BUN: 10 mg/dL (ref 7–26)
CO2: 29 mmol/L (ref 22–29)
CREATININE: 1.14 mg/dL — AB (ref 0.60–1.10)
Calcium: 9 mg/dL (ref 8.4–10.4)
Chloride: 103 mmol/L (ref 98–109)
GFR calc Af Amer: 55 mL/min — ABNORMAL LOW (ref >60)
GFR, EST NON AFRICAN AMERICAN: 48 mL/min — AB (ref >60)
Glucose, Bld: 180 mg/dL — ABNORMAL HIGH (ref 70–140)
POTASSIUM: 3.5 mmol/L (ref 3.5–5.1)
Sodium: 144 mmol/L (ref 136–145)
TOTAL PROTEIN: 6.1 g/dL — AB (ref 6.4–8.3)

## 2017-05-03 LAB — TSH: TSH: 0.5 u[IU]/mL (ref 0.308–3.960)

## 2017-05-03 MED ORDER — SODIUM CHLORIDE 0.9% FLUSH
10.0000 mL | INTRAVENOUS | Status: DC | PRN
Start: 2017-05-03 — End: 2017-05-03
  Administered 2017-05-03: 10 mL
  Filled 2017-05-03: qty 10

## 2017-05-03 MED ORDER — HEPARIN SOD (PORK) LOCK FLUSH 100 UNIT/ML IV SOLN
500.0000 [IU] | Freq: Once | INTRAVENOUS | Status: AC | PRN
  Administered 2017-05-03: 500 [IU]
  Filled 2017-05-03: qty 5

## 2017-05-03 MED ORDER — SODIUM CHLORIDE 0.9 % IV SOLN
Freq: Once | INTRAVENOUS | Status: AC
  Administered 2017-05-03: 15:00:00 via INTRAVENOUS

## 2017-05-03 MED ORDER — NIVOLUMAB CHEMO INJECTION 100 MG/10ML
480.0000 mg | Freq: Once | INTRAVENOUS | Status: AC
  Administered 2017-05-03: 480 mg via INTRAVENOUS
  Filled 2017-05-03: qty 48

## 2017-05-03 MED ORDER — SODIUM CHLORIDE 0.9% FLUSH
10.0000 mL | INTRAVENOUS | Status: DC | PRN
  Administered 2017-05-03: 10 mL via INTRAVENOUS
  Filled 2017-05-03: qty 10

## 2017-05-03 NOTE — Progress Notes (Signed)
[place] OFFICE PROGRESS NOTE  [name], MD [place] [address] [place] [address]  DIAGNOSIS:  1) Metastatic renal cell carcinoma, clear cell type, WHO nuclear grade 3 initially diagnosed in September 2010 and recently presented with bilateral pulmonary nodules. 2) history of renal cell carcinoma status post right nephrectomy in September 2010.  PRIOR THERAPY: 1) Stereotactic radiotherapy to pulmonary nodules based on the metabolic activity with no tissue diagnosis. This was performed under the care of Dr. Pablo Ledger. 2) first line treatment with immunotherapy with Nivolumab 3 MG/KG and Ipilumumab 1 mg/Kg every 3 weeks for 4 cycles followed by maintenance Nivolumab every 3 weeks. First dose 12/27/2016. Status post 3cycles.  CURRENT THERAPY: Nivolumab as a single agent starting on 05/03/2017.  This will be given every 4 weeks.  INTERVAL HISTORY: Melanie Best 72 y.o. female returns for a routine follow-up visit accompanied by her husband.  The patient recently returned from a trip to [place], [place] and felt well during her vacation.  She did have recurrence of her itching approximately 2 weeks ago when she came off her steroids.  She was given a Medrol Dosepak and her itching has now completely resolved.  She denies rashes.  The patient denies fevers and chills.  Denies chest pain, shortness of breath, cough, hemoptysis.  Denies nausea, vomiting, constipation, diarrhea.  She denies recent weight loss or night sweats.  The patient is here for evaluation prior to cycle 1 of single agent Nivolumab given every 4 weeks.  MEDICAL HISTORY: Past Medical History:  Diagnosis Date  . Anemia   . Anxiety   . Arthritis    R leg  . Cancer ([facility])    kidney  . Chronic fatigue 12/13/2016  . CKD (chronic kidney disease), stage II    removed R kidney- 2010- for Cancer, followed by Dr. Diona Fanti  . COPD (chronic obstructive pulmonary disease) ([facility])    uses O2-2 liters  , continuously , followed by Dr. Chase Caller  . Cough 08-02-2015  . Diabetes mellitus, type 2 ([facility])   . DM (diabetes mellitus) ([facility]) 01/27/2012  . DVT (deep venous thrombosis), left    2011- treated /w coumadin   . Dyslipidemia   . Dysrhythmia   . Esophageal reflux   . GERD (gastroesophageal reflux disease) 01/27/2012  . Glaucoma   . Goals of care, counseling/discussion 12/13/2016  . Hemoptysis 08-02-15  . History of radiation therapy 01/09/12,01/11/12,01/15/12,01/17/12,& 01/19/12   rul lung,50Gy/47f  . HTN (hypertension) 01/27/2012  . Hyperlipidemia 01/27/2012  . Hypertension    sees Dr. [place] PCP, denies ever having stress or ECHO, ?when & where she had  last  EKG  . Leukocytosis   . Lung cancer ([facility]    NS[facility]  . Osteopenia   . [place] 12/13/2016  . Shortness of breath   . Syncope   . Tremor   . Urinary incontinence   . Vitamin D deficiency     ALLERGIES:  has No Known Allergies.  MEDICATIONS:  Current Outpatient Medications  Medication Sig Dispense Refill  . aspirin 81 MG tablet Take 81 mg by mouth daily.    . bimatoprost (LUMIGAN) 0.01 % SOLN Place 1 drop into both eyes at bedtime.     . Blood Glucose Monitoring Suppl (ONE TOUCH ULTRA SYSTEM KIT) w/Device KIT 50 kits by Does not apply route once. Testing as directed    . brinzolamide (AZOPT) 1 % ophthalmic suspension Place 1 drop into  both eyes 3 (three) times daily. Reported on 08/12/2015    . clonazePAM (KLONOPIN) 1 MG tablet Take 1 mg by mouth 2 (two) times daily as needed. For anxiety    . dexamethasone (DECADRON) 4 MG tablet Take 4 mg by mouth daily.    Marland Kitchen diltiazem (CARDIZEM CD) 180 MG 24 hr capsule Take 180 mg by mouth daily.  0  . ergocalciferol (VITAMIN D2) 50000 UNITS capsule Take 50,000 Units by mouth once a week. Take on Monday    . esomeprazole (NEXIUM) 40 MG capsule Take 40 mg by mouth daily before breakfast.     . furosemide (LASIX) 20 MG tablet Take 20 mg by mouth daily as  needed. For fluid retention    . ipratropium-albuterol (DUONEB) 0.5-2.5 (3) MG/3ML SOLN Take 3 mLs by nebulization 4 (four) times daily as needed. 360 mL 4  . JANUVIA 50 MG tablet Take 50 mg by mouth daily.     Marland Kitchen lidocaine-prilocaine (EMLA) cream Apply 1 application topically as needed. Apply small amount on a cotton ball and apply to port site. Apply 1-2 hours prior to labs or chemo 30 g 0  . lovastatin (MEVACOR) 20 MG tablet Take 20 mg by mouth at bedtime.    . methocarbamol (ROBAXIN) 750 MG tablet Take 750 mg by mouth 2 (two) times daily as needed.  0  . Misc Natural Products (RED CLOVER COMBINATION PO) Take 800 mg by mouth 2 (two) times daily.    . OXYGEN Inhale 2 L into the lungs continuous.    . primidone (MYSOLINE) 50 MG tablet Take 200 mg by mouth at bedtime.     . promethazine (PHENERGAN) 25 MG tablet take 1 tablet by mouth every 6 hours if needed for nausea  0  . traMADol (ULTRAM) 50 MG tablet Take 1 tablet (50 mg total) by mouth 3 (three) times daily as needed. (Patient not taking: Reported on 05/03/2017) 21 tablet 0   No current facility-administered medications for this visit.    Facility-Administered Medications Ordered in Other Visits  Medication Dose Route Frequency Provider Last Rate Last Dose  . sodium chloride flush (NS) 0.9 % injection 10 mL  10 mL Intracatheter PRN Curt Bears, MD   10 mL at 05/03/17 1547    SURGICAL HISTORY:  Past Surgical History:  Procedure Laterality Date  . BACK SURGERY     for pinched nerve, 2011, here at [facility]  . EYE SURGERY     cataracts removed, ?IOL  . FLEXIBLE BRONCHOSCOPY  11/29/2011  . IR FLUORO GUIDE PORT INSERTION RIGHT  01/30/2017  . IR US GUIDE VASC ACCESS RIGHT  01/30/2017  . removed blood clot     left upper abd quad   . right kidney removed    . right nephrectomy      REVIEW OF SYSTEMS:   Review of Systems  Constitutional: Negative for appetite change, chills, fatigue, fever and unexpected weight change.  HENT:    Negative for mouth sores, nosebleeds, sore throat and trouble swallowing.   Eyes: Negative for eye problems and icterus.  Respiratory: Negative for cough, hemoptysis, shortness of breath and wheezing.   Cardiovascular: Negative for chest pain and leg swelling.  Gastrointestinal: Negative for abdominal pain, constipation, diarrhea, nausea and vomiting.  Genitourinary: Negative for bladder incontinence, difficulty urinating, dysuria, frequency and hematuria.   Musculoskeletal: Negative for back pain, neck pain and neck stiffness.  Skin: Negative for itching and rash.  Neurological: Negative for dizziness, extremity weakness, gait problem, headaches,  light-headedness and seizures.  Hematological: Negative for adenopathy. Does not bruise/bleed easily.  Psychiatric/Behavioral: Negative for confusion, depression and sleep disturbance. The patient is not nervous/anxious.     PHYSICAL EXAMINATION:  Blood pressure (!) 116/59, pulse 99, temperature 98.6 F (37 C), temperature source Oral, resp. rate 20, height '5\' 5"'  (1.651 m), weight 156 lb (70.8 kg), SpO2 98 %.  ECOG PERFORMANCE STATUS: 1 - Symptomatic but completely ambulatory  Physical Exam  Constitutional: Oriented to person, place, and time and well-developed, well-nourished, and in no distress. No distress.  HENT:  Head: Normocephalic and atraumatic.  Mouth/Throat: Oropharynx is clear and moist. No oropharyngeal exudate.  Eyes: Conjunctivae are normal. Right eye exhibits no discharge. Left eye exhibits no discharge. No scleral icterus.  Neck: Normal range of motion. Neck supple.  Cardiovascular: Normal rate, regular rhythm, normal heart sounds and intact distal pulses.   Pulmonary/Chest: Effort normal and breath sounds normal. No respiratory distress. No wheezes. No rales.  Abdominal: Soft. Bowel sounds are normal. Exhibits no distension and no mass. There is no tenderness.  Musculoskeletal: Normal range of motion. Exhibits no edema.   Lymphadenopathy:    No cervical adenopathy.  Neurological: Alert and oriented to person, place, and time. Exhibits normal muscle tone. Coordination normal.  Skin: Skin is warm and dry. No rash noted. Not diaphoretic. No erythema. No pallor.  Psychiatric: Mood, memory and judgment normal.  Vitals reviewed.  LABORATORY DATA: Lab Results  Component Value Date   WBC 15.4 (H) 05/03/2017   HGB 9.5 (L) 05/03/2017   HCT 30.2 (L) 05/03/2017   MCV 99.4 05/03/2017   PLT 214 05/03/2017      Chemistry      Component Value Date/Time   NA 144 05/03/2017 1243   NA 141 03/08/2017 1258   K 3.5 05/03/2017 1243   K 3.7 03/08/2017 1258   CL 103 05/03/2017 1243   CO2 29 05/03/2017 1243   CO2 23 03/08/2017 1258   BUN 10 05/03/2017 1243   BUN 24.1 03/08/2017 1258   CREATININE 1.14 (H) 05/03/2017 1243   CREATININE 1.4 (H) 03/08/2017 1258      Component Value Date/Time   CALCIUM 9.0 05/03/2017 1243   CALCIUM 8.8 03/08/2017 1258   ALKPHOS 67 05/03/2017 1243   ALKPHOS 65 03/08/2017 1258   AST 9 05/03/2017 1243   AST 8 03/08/2017 1258   ALT 13 05/03/2017 1243   ALT 20 03/08/2017 1258   BILITOT 0.2 05/03/2017 1243   BILITOT 0.40 03/08/2017 1258       RADIOGRAPHIC STUDIES:  No results found.   ASSESSMENT/PLAN:  Renal cell carcinoma of right kidney [facility]) This is a very pleasant 72 year old African-American female with metastatic renal cell carcinoma that was initially diagnosed in 2010 and now presenting with bilateral pulmonary nodules. She was treated with stereotactic radiotherapy to hypermetabolic nodules without tissue diagnosis in the past. Imaging studies showed hypermetabolic and enlarging bilateral pulmonary nodules. CT-guided core biopsy of right pulmonary nodule was consistent with metastatic renal cell carcinoma. The patient is started treatment with immunotherapy with ipilimumab and Nivolumab status post3cycles. She tolerated this treatment well except for severe  itchingand treatment was placed on hold. Itching improved with prednisone.  She is now off all steroids. She is here for evaluation prior to starting cycle 1 of single agent Nivolumab.  Patient was seen with Dr. Julien Nordmann.  Recommend that she proceed with cycle 1 of her treatment as scheduled today.  We discussed that she may develop itching  or rash again with this treatment.  She was advised to let us know if this develops.  If the itching and rash recur, may need to consider other treatment options for her renal cell carcinoma. The patient will follow-up in 4 weeks for evaluation prior to cycle #2.  The patient was advised to call immediately if she has any other concerning symptoms in the interval. The patient voices understanding of current disease status and treatment options and is in agreement with the current care plan. All questions were answered. The patient knows to call the clinic with any problems, questions or concerns. We can certainly see the patient much sooner if necessary.  No orders of the defined types were placed in this encounter.  Mikey Bussing, DNP, AGPCNP-BC, AOCNP 05/03/17  ADDENDUM: Hematology/Oncology Attending: I had a face-to-face encounter with the patient.  I recommended her care plan.  This is a very pleasant 73 years old African-American female with metastatic renal cell carcinoma that was initially diagnosed in 2010 but she developed bilateral pulmonary nodules and was biopsy-proven to have metastatic renal cell carcinoma in 2018.  The patient was a started on treatment with combination immunotherapy with ipilimumab and Nivolumab status post 3 cycles.  She has been tolerating this treatment well and has good response to the treatment but unfortunately she developed significant pruritus and she was a started on a taper dose of prednisone with improvement of the itching.  She is currently off steroids. She is here today for reevaluation and resuming her treatment.   I recommended for her to discontinue the ipilimumab at this point. We will continue with single agent Nivolumab 480 mg IV every 4 weeks starting from today. The patient was advised to call immediately if she develops any other itching, skin rash or any other adverse effect from this treatment. She will come back for follow-up visit in 4 weeks for evaluation before starting cycle #2.  Disclaimer: This note was dictated with voice recognition software. Similar sounding words can inadvertently be transcribed and may be missed upon review. Eilleen Kempf, MD 05/04/17

## 2017-05-03 NOTE — Patient Instructions (Addendum)
[facility] Discharge Instructions for Patients Receiving Chemotherapy  Today you received the following chemotherapy agents: Nivolumab  To help prevent nausea and vomiting after your treatment, we encourage you to take your nausea medication as directed.    If you develop nausea and vomiting that is not controlled by your nausea medication, call the clinic.   BELOW ARE SYMPTOMS THAT SHOULD BE REPORTED IMMEDIATELY:  *FEVER GREATER THAN 100.5 F  *CHILLS WITH OR WITHOUT FEVER  NAUSEA AND VOMITING THAT IS NOT CONTROLLED WITH YOUR NAUSEA MEDICATION  *UNUSUAL SHORTNESS OF BREATH  *UNUSUAL BRUISING OR BLEEDING  TENDERNESS IN MOUTH AND THROAT WITH OR WITHOUT PRESENCE OF ULCERS  *URINARY PROBLEMS  *BOWEL PROBLEMS  UNUSUAL RASH Items with * indicate a potential emergency and should be followed up as soon as possible.  Feel free to call the clinic should you have any questions or concerns. The clinic phone number is [phone].  Please show the CHEMO ALERT CARD at check-in to the Emergency Department and triage nurse.   

## 2017-05-03 NOTE — Assessment & Plan Note (Signed)
This is a very pleasant 72 year old African-American female with metastatic renal cell carcinoma that was initially diagnosed in 2010 and now presenting with bilateral pulmonary nodules. She was treated with stereotactic radiotherapy to hypermetabolic nodules without tissue diagnosis in the past. Imaging studies showed hypermetabolic and enlarging bilateral pulmonary nodules. CT-guided core biopsy of right pulmonary nodule was consistent with metastatic renal cell carcinoma. The patient is started treatment with immunotherapy with ipilimumab and Nivolumab status post3cycles. She tolerated this treatment well except for severe itchingand treatment was placed on hold. Itching improved with prednisone.  She is now off all steroids. She is here for evaluation prior to starting cycle 1 of single agent Nivolumab.  Patient was seen with Dr. Julien Nordmann.  Recommend that she proceed with cycle 1 of her treatment as scheduled today.  We discussed that she may develop itching or rash again with this treatment.  She was advised to let us know if this develops.  If the itching and rash recur, may need to consider other treatment options for her renal cell carcinoma. The patient will follow-up in 4 weeks for evaluation prior to cycle #2.  The patient was advised to call immediately if she has any other concerning symptoms in the interval. The patient voices understanding of current disease status and treatment options and is in agreement with the current care plan. All questions were answered. The patient knows to call the clinic with any problems, questions or concerns. We can certainly see the patient much sooner if necessary.

## 2017-05-04 ENCOUNTER — Telehealth: Payer: Self-pay | Admitting: Oncology

## 2017-05-04 NOTE — Telephone Encounter (Signed)
Scheduled appt per 2/14 los - Pt to get an new scheduled next treatment.

## 2017-05-17 ENCOUNTER — Other Ambulatory Visit: Payer: Medicare Other

## 2017-05-17 ENCOUNTER — Ambulatory Visit: Payer: Medicare Other | Admitting: Internal Medicine

## 2017-05-17 ENCOUNTER — Ambulatory Visit: Payer: Medicare Other

## 2017-05-17 NOTE — Progress Notes (Signed)
Subjective:   Melanie Best was seen in consultation in the movement disorder clinic at the request of [name], MD.  The evaluation is for tremor.  Pt is accompanied by her husband who supplements the history.  Pt reports that it has been going on for over 20 years.  It started in head but it went to the hands about a year ago.  It bothers her in the hands as she has trouble writing and dialing a phone.  The head doesn't bother her.  Her grandmother and brother both had tremor.  Pt was given primidone on 03/03/14 and took one pill and it made her dizzy and she didn't take it again.  She has not been on any other tremor medications.  Affected by caffeine:  unknown (drinks 2-3 cans pepsi per day) Affected by alcohol:  Doesn't drink Affected by stress:  No. Affected by fatigue:  No. Spills soup if on spoon:  Yes.   Spills glass of liquid if full:  No. Affects ADL's (tying shoes, brushing teeth, etc):  No.   07/03/14 update:  Pt is f/u today, accompanied by her husband who supplements the history.  Pt has hand tremor that is exacerbated by her meds for COPD.  The records that were made available to me were reviewed since last visit.  She remains on prednisone and duoneb.  I had her retry primidone last visit.  She states that it was helping some but not enough and on Tuesday her pcp increased her to bid dosing.  She isn't sure that it has helping more yet.  No SE.  No sleepiness with it.   She also has cervical dystonia but we both agreed to hold on treatment for that.  05/21/17 update: Patient is seen today in follow-up.  She is accompanied by her husband who supplements the history.  I have not seen patient in about 3 years.  I have reviewed primary care records.  She was last seen by her primary care physician on January, 2019 which was a mobility evaluation for power wheelchair.  She does have metastatic renal cell carcinoma, grade 3, now with pulmonary nodules.  Patient has been on steroids  for itching.  She is currently on primidone, 50 mg, 1 tablet 4 times per day.  When I last saw her several years ago I thought her tremor was multifactorial and likely exacerbated by, if not caused by, her multiple medications for her breathing and lungs.  She is on primidone 50 mg qid (states that she thinks that she is on 100 mg bid).  Medication is making her sleepy.   She has COPD.  She is on O2.  She uses albuterol.  Tremor is not worse but it is not better.  She has times when tremor is much worse than others. She went off primidone and felt like she was doing well but then she got worsening tremor and went back on it and "tremor has been worse."  No trouble dressing or eating due to tremor but sometimes she has trouble getting fork in the mouth.  Able to drink and pour without trouble.  Current/Previously tried tremor medications: klonopin (takes 1 mg bid for anxiety)  Current medications that may exacerbate tremor:  Prednisone (1m currently), albuterol/atrovent (uses tid),  Outside reports reviewed: historical medical records, lab reports and referral letter/letters.  No Known Allergies  Outpatient Encounter Medications as of 05/21/2017  Medication Sig  . aspirin 81 MG tablet Take 81 mg  by mouth daily.  . bimatoprost (LUMIGAN) 0.01 % SOLN Place 1 drop into both eyes at bedtime.   . brinzolamide (AZOPT) 1 % ophthalmic suspension Place 1 drop into both eyes 3 (three) times daily. Reported on 08/12/2015  . CALCIUM PO Take by mouth daily.  . clonazePAM (KLONOPIN) 1 MG tablet Take 1 mg by mouth 2 (two) times daily as needed. For anxiety  . dexamethasone (DECADRON) 4 MG tablet Take 4 mg by mouth daily.  Marland Kitchen diltiazem (CARDIZEM CD) 180 MG 24 hr capsule Take 180 mg by mouth daily.  . ergocalciferol (VITAMIN D2) 50000 UNITS capsule Take 50,000 Units by mouth once a week. Take on Monday  . esomeprazole (NEXIUM) 40 MG capsule Take 40 mg by mouth daily before breakfast.   . furosemide (LASIX) 20 MG  tablet Take 20 mg by mouth daily as needed. For fluid retention  . gabapentin (NEURONTIN) 100 MG capsule Take 300 mg by mouth daily.  . hydrOXYzine (ATARAX/VISTARIL) 25 MG tablet Take 25 mg by mouth as needed.  Marland Kitchen ipratropium-albuterol (DUONEB) 0.5-2.5 (3) MG/3ML SOLN Take 3 mLs by nebulization 4 (four) times daily as needed.  Marland Kitchen JANUVIA 50 MG tablet Take 50 mg by mouth daily.   Marland Kitchen lidocaine-prilocaine (EMLA) cream Apply 1 application topically as needed. Apply small amount on a cotton ball and apply to port site. Apply 1-2 hours prior to labs or chemo  . lovastatin (MEVACOR) 20 MG tablet Take 20 mg by mouth at bedtime.  . methocarbamol (ROBAXIN) 750 MG tablet Take 750 mg by mouth 2 (two) times daily as needed.  . OXYGEN Inhale 2 L into the lungs continuous.  . predniSONE (DELTASONE) 5 MG tablet Take 5 mg by mouth daily with breakfast.  . primidone (MYSOLINE) 50 MG tablet Take 100 mg by mouth 2 (two) times daily.   . promethazine (PHENERGAN) 25 MG tablet take 1 tablet by mouth every 6 hours if needed for nausea  . traMADol (ULTRAM) 50 MG tablet Take 1 tablet (50 mg total) by mouth 3 (three) times daily as needed.  . [DISCONTINUED] Blood Glucose Monitoring Suppl (ONE TOUCH ULTRA SYSTEM KIT) w/Device KIT 50 kits by Does not apply route once. Testing as directed  . [DISCONTINUED] Misc Natural Products (RED CLOVER COMBINATION PO) Take 800 mg by mouth 2 (two) times daily.   No facility-administered encounter medications on file as of 05/21/2017.     Past Medical History:  Diagnosis Date  . Anemia   . Anxiety   . Arthritis    R leg  . Cancer ([place])    kidney  . Chronic fatigue 12/13/2016  . CKD (chronic kidney disease), stage II    removed R kidney- 2010- for Cancer, followed by Dr. Diona Fanti  . COPD (chronic obstructive pulmonary disease) ([facility])    uses O2-2 liters , continuously , followed by Dr. Chase Caller  . Cough 08-02-2015  . Diabetes mellitus, type 2 ([facility])   . DM (diabetes mellitus) ([place])  01/27/2012  . DVT (deep venous thrombosis), left    2011- treated /w coumadin   . Dyslipidemia   . Dysrhythmia   . Esophageal reflux   . GERD (gastroesophageal reflux disease) 01/27/2012  . Glaucoma   . Goals of care, counseling/discussion 12/13/2016  . Hemoptysis 08-02-15  . History of radiation therapy 01/09/12,01/11/12,01/15/12,01/17/12,& 01/19/12   rul lung,50Gy/2f  . HTN (hypertension) 01/27/2012  . Hyperlipidemia 01/27/2012  . Hypertension    sees Dr. [place]st [place] PCP, denies ever having stress or ECHO, ?when &  where she had  last  EKG  . Leukocytosis   . Lung cancer ([place])    NSCLC  . Osteopenia   . Renal cell carcinoma of right kidney ([place]) 12/13/2016  . Shortness of breath   . Syncope   . Tremor   . Urinary incontinence   . Vitamin D deficiency     Past Surgical History:  Procedure Laterality Date  . BACK SURGERY     for pinched nerve, 2011, [facility]  . EYE SURGERY     cataracts removed, ?IOL  . FLEXIBLE BRONCHOSCOPY  11/29/2011  . IR FLUORO GUIDE PORT INSERTION RIGHT  01/30/2017  . IR US GUIDE VASC ACCESS RIGHT  01/30/2017  . removed blood clot     left upper abd quad   . right kidney removed    . right nephrectomy      Social History   Socioeconomic History  . Marital status: Married    Spouse name: Not on file  . Number of children: Not on file  . Years of education: Not on file  . Highest education level: Not on file  Social Needs  . Financial resource strain: Not on file  . Food insecurity - worry: Not on file  . Food insecurity - inability: Not on file  . Transportation needs - medical: Not on file  . Transportation needs - non-medical: Not on file  Occupational History  . Not on file  Tobacco Use  . Smoking status: Former Smoker    Packs/day: 1.00    Years: 35.00    Pack years: 35.00    Types: Cigarettes    Last attempt to quit: 03/20/2004    Years since quitting: 13.1  . Smokeless tobacco: Never Used  Substance and Sexual Activity  .  Alcohol use: No  . Drug use: No  . Sexual activity: Not on file  Other Topics Concern  . Not on file  Social History Narrative  . Not on file    Family Status  Relation Name Status  . Mother  Alive       HTN  . Father  Deceased       emphysema  . Sister  Alive       cancer   . Brother  Deceased       throat cancer  . Brother  Deceased       lung cancer  . Brother  Deceased       lung cancer  . Brother  Deceased  . Sister  Deceased       emphysema  . Sister  Alive       healthy  . Daughter  Alive       breast cancer  . Daughter  Alive       healthy  . Son  Alive       healthy  . Son  Alive       healthy  . Neg Hx  (Not Specified)    Review of Systems Review of Systems  Constitutional: Positive for malaise/fatigue.  HENT: Negative.   Eyes: Negative.   Respiratory: Positive for shortness of breath.   Cardiovascular: Negative.   Gastrointestinal: Negative.   Genitourinary: Negative.   Skin: Positive for itching.  Neurological: Positive for tingling (since chemo in arms - on gabapentin).  Endo/Heme/Allergies: Negative.   Psychiatric/Behavioral: Negative.      Objective:   VITALS:   Vitals:   05/21/17 1402  BP: 124/68  Pulse: 96  SpO2: 91%   Gen:  Appears stated age and in NAD. HEENT:  Normocephalic, atraumatic. The mucous membranes are moist. The superficial temporal arteries are without ropiness or tenderness. Cardiovascular: Regular rate and rhythm. Lungs: CTAB.  On O2 Neck: There are no carotid bruits noted bilaterally.     Gen:  Appears stated age and in NAD. HEENT:  Normocephalic, atraumatic. The mucous membranes are moist. The superficial temporal arteries are without ropiness or tenderness. Cardiovascular: Regular rate and rhythm. Lungs: Clear to auscultation bilaterally. Neck: There are no carotid bruits noted bilaterally.  NEUROLOGICAL:  Orientation:  The patient is alert and oriented x 3.  Recent and remote memory are intact.   Attention span and concentration are normal.  Able to name objects and repeat without trouble.  Fund of knowledge is appropriate Cranial nerves: There is good facial symmetry.  Extraocular muscles are intact and visual fields are full to confrontational testing. Speech is fluent and clear. Soft palate rises symmetrically and there is no tongue deviation. Hearing is intact to conversational tone. Tone: Tone is good throughout. Sensation: Sensation is intact to light touch x 4 Coordination:  The patient has no difficulty with RAM's or FNF bilaterally. Motor: Strength is at least antigravity x 4 Gait and Station: deferred.  In [place] with O2  MOVEMENT EXAM: Tremor:  There is minor postural tremor.  No intention tremor.  Head tremor is noted in the "no" direction.  she has no significant difficulty with archimedes spirals and compared to 3 years ago, are not significantly worse  Lab Results  Component Value Date   TSH 0.500 05/03/2017        Assessment/Plan:   1.  Cervical dystonia  -This is the cause of her head tremor.  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  She is not bothered by this 2.  Hand tremor  -May have some ET, but likely exacerbated if not due to the multiple pulmonary medications used to treat her COPD.   This was my impression 3 years ago as well.  Not signficantly worse by examination.  On albuterol qid  -she is already on primidone, 100 mg bid and not a candidate for beta blocker therapy due to her lungs.  Finds that daytime primidone makes her sleepy.  Could increase dose at night.  Talked to her about artane, and risks in this age group due to anticholinergic properties.  She would rather cautiously try this than use more of the primidone right now.  Start artane, 2 mg q day and will call her in 2 weeks.  Told to d/c it should have any cognitive SE 3.  Metastatic renal cell with pulmonary nodules  -seeing oncology. 4.  Follow  up is anticipated in the next few months, sooner should new neurologic issues arise.  Much greater than 50% of this visit was spent in counseling and coordinating care.  Total face to face time:  25 min

## 2017-05-21 ENCOUNTER — Ambulatory Visit: Payer: Medicare Other | Admitting: Neurology

## 2017-05-21 ENCOUNTER — Encounter: Payer: Self-pay | Admitting: Neurology

## 2017-05-21 VITALS — BP 124/68 | HR 96

## 2017-05-21 DIAGNOSIS — G251 Drug-induced tremor: Secondary | ICD-10-CM

## 2017-05-21 DIAGNOSIS — C649 Malignant neoplasm of unspecified kidney, except renal pelvis: Secondary | ICD-10-CM

## 2017-05-21 MED ORDER — TRIHEXYPHENIDYL HCL 2 MG PO TABS: 2.0000 mg | ORAL_TABLET | Freq: Every day | ORAL | 0 refills | Status: DC

## 2017-05-21 NOTE — Patient Instructions (Signed)
Start trihexyphenidyl (artane) 2 mg - one per day in the AM.  I will call you in 2 weeks to see how you are doing.  Call me and stop it should you experience hallucinations, confusion, etc

## 2017-05-31 ENCOUNTER — Inpatient Hospital Stay: Payer: Medicare Other | Attending: Internal Medicine | Admitting: Oncology

## 2017-05-31 ENCOUNTER — Telehealth: Payer: Self-pay | Admitting: Oncology

## 2017-05-31 ENCOUNTER — Inpatient Hospital Stay: Payer: Medicare Other

## 2017-05-31 ENCOUNTER — Encounter: Payer: Self-pay | Admitting: Oncology

## 2017-05-31 VITALS — BP 114/65 | HR 92 | Temp 98.9°F | Resp 24 | Ht 65.0 in

## 2017-05-31 DIAGNOSIS — C649 Malignant neoplasm of unspecified kidney, except renal pelvis: Secondary | ICD-10-CM

## 2017-05-31 DIAGNOSIS — Z79899 Other long term (current) drug therapy: Secondary | ICD-10-CM | POA: Insufficient documentation

## 2017-05-31 DIAGNOSIS — C641 Malignant neoplasm of right kidney, except renal pelvis: Secondary | ICD-10-CM

## 2017-05-31 DIAGNOSIS — R5382 Chronic fatigue, unspecified: Secondary | ICD-10-CM

## 2017-05-31 DIAGNOSIS — C7801 Secondary malignant neoplasm of right lung: Secondary | ICD-10-CM | POA: Diagnosis not present

## 2017-05-31 DIAGNOSIS — C7802 Secondary malignant neoplasm of left lung: Secondary | ICD-10-CM | POA: Diagnosis not present

## 2017-05-31 DIAGNOSIS — Z95828 Presence of other vascular implants and grafts: Secondary | ICD-10-CM

## 2017-05-31 DIAGNOSIS — Z5112 Encounter for antineoplastic immunotherapy: Secondary | ICD-10-CM | POA: Insufficient documentation

## 2017-05-31 LAB — COMPREHENSIVE METABOLIC PANEL
ALK PHOS: 87 U/L (ref 40–150)
ALT: 13 U/L (ref 0–55)
ANION GAP: 10 (ref 3–11)
AST: 12 U/L (ref 5–34)
Albumin: 3.1 g/dL — ABNORMAL LOW (ref 3.5–5.0)
BUN: 12 mg/dL (ref 7–26)
CHLORIDE: 104 mmol/L (ref 98–109)
CO2: 30 mmol/L — AB (ref 22–29)
Calcium: 9.5 mg/dL (ref 8.4–10.4)
Creatinine, Ser: 1.26 mg/dL — ABNORMAL HIGH (ref 0.60–1.10)
GFR calc non Af Amer: 42 mL/min — ABNORMAL LOW (ref >60)
GFR, EST AFRICAN AMERICAN: 49 mL/min — AB (ref >60)
Glucose, Bld: 163 mg/dL — ABNORMAL HIGH (ref 70–140)
POTASSIUM: 3.7 mmol/L (ref 3.5–5.1)
SODIUM: 144 mmol/L (ref 136–145)
Total Bilirubin: <0.2 mg/dL — ABNORMAL LOW (ref 0.2–1.2)
Total Protein: 7 g/dL (ref 6.4–8.3)

## 2017-05-31 LAB — CBC WITH DIFFERENTIAL/PLATELET
BASOS PCT: 1 %
Basophils Absolute: 0.1 10*3/uL (ref 0.0–0.1)
EOS ABS: 0.1 10*3/uL (ref 0.0–0.5)
EOS PCT: 1 %
HCT: 28.4 % — ABNORMAL LOW (ref 34.8–46.6)
HEMOGLOBIN: 9.1 g/dL — AB (ref 11.6–15.9)
Lymphocytes Relative: 11 %
Lymphs Abs: 1.5 10*3/uL (ref 0.9–3.3)
MCH: 31.5 pg (ref 25.1–34.0)
MCHC: 31.9 g/dL (ref 31.5–36.0)
MCV: 98.7 fL (ref 79.5–101.0)
MONOS PCT: 3 %
Monocytes Absolute: 0.5 10*3/uL (ref 0.1–0.9)
NEUTROS PCT: 84 %
Neutro Abs: 12.1 10*3/uL — ABNORMAL HIGH (ref 1.5–6.5)
PLATELETS: 248 10*3/uL (ref 145–400)
RBC: 2.88 MIL/uL — ABNORMAL LOW (ref 3.70–5.45)
RDW: 15.3 % — AB (ref 11.2–14.5)
WBC: 14.4 10*3/uL — ABNORMAL HIGH (ref 3.9–10.3)

## 2017-05-31 LAB — TSH: TSH: 0.605 u[IU]/mL (ref 0.308–3.960)

## 2017-05-31 MED ORDER — SODIUM CHLORIDE 0.9 % IV SOLN
480.0000 mg | Freq: Once | INTRAVENOUS | Status: AC
  Administered 2017-05-31: 480 mg via INTRAVENOUS
  Filled 2017-05-31: qty 48

## 2017-05-31 MED ORDER — SODIUM CHLORIDE 0.9% FLUSH
10.0000 mL | INTRAVENOUS | Status: DC | PRN
  Administered 2017-05-31: 10 mL via INTRAVENOUS
  Filled 2017-05-31: qty 10

## 2017-05-31 MED ORDER — SODIUM CHLORIDE 0.9 % IV SOLN
Freq: Once | INTRAVENOUS | Status: AC
  Administered 2017-05-31: 14:00:00 via INTRAVENOUS

## 2017-05-31 MED ORDER — HEPARIN SOD (PORK) LOCK FLUSH 100 UNIT/ML IV SOLN
500.0000 [IU] | Freq: Once | INTRAVENOUS | Status: AC | PRN
  Administered 2017-05-31: 500 [IU]
  Filled 2017-05-31: qty 5

## 2017-05-31 MED ORDER — SODIUM CHLORIDE 0.9% FLUSH
10.0000 mL | INTRAVENOUS | Status: DC | PRN
  Administered 2017-05-31: 10 mL
  Filled 2017-05-31: qty 10

## 2017-05-31 NOTE — Telephone Encounter (Signed)
Scheduled appt per 3/14 los - patient to get an updated schedule in the tx area.

## 2017-05-31 NOTE — Progress Notes (Signed)
[facility] OFFICE PROGRESS NOTE  [name], MD [place] [address] [place] [address]  DIAGNOSIS:  1) Metastatic renal cell carcinoma, clear cell type, WHO nuclear grade 3 initially diagnosed in September 2010 and recently presented with bilateral pulmonary nodules. 2) history of renal cell carcinoma status post right nephrectomy in September 2010.  PRIOR THERAPY: 1)Stereotactic radiotherapy to pulmonary nodules based on the metabolic activity with no tissue diagnosis. This was performed under the care of Dr. Pablo Ledger. 2)first line treatment with immunotherapy with Nivolumab 3 MG/KG and Ipilumumab 1 mg/Kg every 3 weeks for 4 cycles followed by maintenance Nivolumab every 3 weeks. First dose 12/27/2016. Status post 3cycles.  Stopped due to severe itching.  CURRENT THERAPY: Nivolumab 480 mg IV every 4 weeks as a single agent starting on 05/03/2017.  Status post 1 cycle.  INTERVAL HISTORY: LODIE Best 72 y.o. female returns for routine follow-up visit accompanied by her husband.  The patient is feeling fine today with the exception of feeling tired.  She was recently started on new medication for her tremors which is making her more sleepy.  She denies fevers and chills.  Denies chest pain, shortness of breath, cough, hemoptysis.  Denies nausea, vomiting, constipation, diarrhea.  She denies recent weight loss or night sweats.  She was unable to stand to be weighed today.  Denies rashes and itching.  She tolerated her first cycle of single agent Nivolumab fairly well.  The patient is here for evaluation prior to cycle #2 of Nivolumab.  MEDICAL HISTORY: Past Medical History:  Diagnosis Date  . Anemia   . Anxiety   . Arthritis    R leg  . Cancer ([place])    kidney  . Chronic fatigue 12/13/2016  . CKD (chronic kidney disease), stage II    removed R kidney- 2010- for Cancer, followed by Dr. Diona Fanti  . COPD (chronic obstructive pulmonary disease) ([facility])     uses O2-2 liters , continuously , followed by Dr. Chase Caller  . Cough 08-02-2015  . Diabetes mellitus, type 2 ([place])   . DM (diabetes mellitus) ([facility]) 01/27/2012  . DVT (deep venous thrombosis), left    2011- treated /w coumadin   . Dyslipidemia   . Dysrhythmia   . Esophageal reflux   . GERD (gastroesophageal reflux disease) 01/27/2012  . Glaucoma   . Goals of care, counseling/discussion 12/13/2016  . Hemoptysis 08-02-15  . History of radiation therapy 01/09/12,01/11/12,01/15/12,01/17/12,& 01/19/12   rul lung,50Gy/52fx  . HTN (hypertension) 01/27/2012  . Hyperlipidemia 01/27/2012  . Hypertension    sees Dr. Deforest Hoyles for PCP, denies ever having stress or ECHO, ?when & where she had  last  EKG  . Leukocytosis   . Lung cancer ([place])    NSCLC  . Osteopenia   . Renal cell carcinoma of right kidney ([facility]) 12/13/2016  . Shortness of breath   . Syncope   . Tremor   . Urinary incontinence   . Vitamin D deficiency     ALLERGIES:  has No Known Allergies.  MEDICATIONS:  Current Outpatient Medications  Medication Sig Dispense Refill  . aspirin 81 MG tablet Take 81 mg by mouth daily.    . bimatoprost (LUMIGAN) 0.01 % SOLN Place 1 drop into both eyes at bedtime.     . brinzolamide (AZOPT) 1 % ophthalmic suspension Place 1 drop into both eyes 3 (three) times daily. Reported on 08/12/2015    . CALCIUM PO Take by mouth daily.    Marland Kitchen  clonazePAM (KLONOPIN) 1 MG tablet Take 1 mg by mouth 2 (two) times daily as needed. For anxiety    . dexamethasone (DECADRON) 4 MG tablet Take 4 mg by mouth daily.    Marland Kitchen diltiazem (CARDIZEM CD) 180 MG 24 hr capsule Take 180 mg by mouth daily.  0  . ergocalciferol (VITAMIN D2) 50000 UNITS capsule Take 50,000 Units by mouth once a week. Take on Monday    . esomeprazole (NEXIUM) 40 MG capsule Take 40 mg by mouth daily before breakfast.     . furosemide (LASIX) 20 MG tablet Take 20 mg by mouth daily as needed. For fluid retention    . gabapentin (NEURONTIN) 100 MG capsule Take 300 mg  by mouth daily.  0  . hydrOXYzine (ATARAX/VISTARIL) 25 MG tablet Take 25 mg by mouth as needed.    Marland Kitchen ipratropium-albuterol (DUONEB) 0.5-2.5 (3) MG/3ML SOLN Take 3 mLs by nebulization 4 (four) times daily as needed. 360 mL 4  . JANUVIA 50 MG tablet Take 50 mg by mouth daily.     Marland Kitchen lidocaine-prilocaine (EMLA) cream Apply 1 application topically as needed. Apply small amount on a cotton ball and apply to port site. Apply 1-2 hours prior to labs or chemo 30 g 0  . lovastatin (MEVACOR) 20 MG tablet Take 20 mg by mouth at bedtime.    . methocarbamol (ROBAXIN) 750 MG tablet Take 750 mg by mouth 2 (two) times daily as needed.  0  . OXYGEN Inhale 2 L into the lungs continuous.    . predniSONE (DELTASONE) 5 MG tablet Take 5 mg by mouth daily with breakfast.    . primidone (MYSOLINE) 50 MG tablet Take 100 mg by mouth 2 (two) times daily.     . promethazine (PHENERGAN) 25 MG tablet take 1 tablet by mouth every 6 hours if needed for nausea  0  . traMADol (ULTRAM) 50 MG tablet Take 1 tablet (50 mg total) by mouth 3 (three) times daily as needed. 21 tablet 0  . trihexyphenidyl (ARTANE) 2 MG tablet Take 1 tablet (2 mg total) by mouth daily. 90 tablet 0   No current facility-administered medications for this visit.    Facility-Administered Medications Ordered in Other Visits  Medication Dose Route Frequency Provider Last Rate Last Dose  . heparin lock flush 100 unit/mL  500 Units Intracatheter Once PRN Curt Bears, MD      . nivolumab (OPDIVO) 480 mg in sodium chloride 0.9 % 100 mL chemo infusion  480 mg Intravenous Once Curt Bears, MD      . sodium chloride flush (NS) 0.9 % injection 10 mL  10 mL Intracatheter PRN Curt Bears, MD        SURGICAL HISTORY:  Past Surgical History:  Procedure Laterality Date  . BACK SURGERY     for pinched nerve, 2011, here at [facility]  . EYE SURGERY     cataracts removed, ?IOL  . FLEXIBLE BRONCHOSCOPY  11/29/2011  . IR FLUORO GUIDE PORT INSERTION RIGHT   01/30/2017  . IR US GUIDE VASC ACCESS RIGHT  01/30/2017  . removed blood clot     left upper abd quad   . right kidney removed    . right nephrectomy      REVIEW OF SYSTEMS:   Review of Systems  Constitutional: Negative for appetite change, chills, fatigue, fever and unexpected weight change.  HENT:   Negative for mouth sores, nosebleeds, sore throat and trouble swallowing.   Eyes: Negative for eye problems and  icterus.  Respiratory: Negative for cough, hemoptysis, shortness of breath and wheezing.   Cardiovascular: Negative for chest pain and leg swelling.  Gastrointestinal: Negative for abdominal pain, constipation, diarrhea, nausea and vomiting.  Genitourinary: Negative for bladder incontinence, difficulty urinating, dysuria, frequency and hematuria.   Musculoskeletal: Negative for back pain, neck pain and neck stiffness.  Skin: Negative for itching and rash.  Neurological: Negative for dizziness, extremity weakness, headaches, light-headedness and seizures.  Hematological: Negative for adenopathy. Does not bruise/bleed easily.  Psychiatric/Behavioral: Negative for confusion, depression and sleep disturbance. The patient is not nervous/anxious.     PHYSICAL EXAMINATION:  Blood pressure 114/65, pulse 92, temperature 98.9 F (37.2 C), temperature source Oral, resp. rate (!) 24, height 5\' 5"  (1.651 m), SpO2 100 %.  ECOG PERFORMANCE STATUS: 1 - Symptomatic but completely ambulatory  Physical Exam  Constitutional: Oriented to person, place, and time and well-developed, well-nourished, and in no distress. No distress.  HENT:  Head: Normocephalic and atraumatic.  Mouth/Throat: Oropharynx is clear and moist. No oropharyngeal exudate.  Eyes: Conjunctivae are normal. Right eye exhibits no discharge. Left eye exhibits no discharge. No scleral icterus.  Neck: Normal range of motion. Neck supple.  Cardiovascular: Normal rate, regular rhythm, normal heart sounds and intact distal pulses.    Pulmonary/Chest: Effort normal and breath sounds normal. No respiratory distress. No wheezes. No rales. Wears home oxygen. Abdominal: Soft. Bowel sounds are normal. Exhibits no distension and no mass. There is no tenderness.  Musculoskeletal: Normal range of motion. Exhibits no edema.  Lymphadenopathy:    No cervical adenopathy.  Neurological: Alert and oriented to person, place, and time. Exhibits normal muscle tone. Coordination normal.  Skin: Skin is warm and dry. No rash noted. Not diaphoretic. No erythema. No pallor.  Psychiatric: Mood, memory and judgment normal.  Vitals reviewed.  LABORATORY DATA: Lab Results  Component Value Date   WBC 14.4 (H) 05/31/2017   HGB 9.1 (L) 05/31/2017   HCT 28.4 (L) 05/31/2017   MCV 98.7 05/31/2017   PLT 248 05/31/2017      Chemistry      Component Value Date/Time   NA 144 05/31/2017 1138   NA 141 03/08/2017 1258   K 3.7 05/31/2017 1138   K 3.7 03/08/2017 1258   CL 104 05/31/2017 1138   CO2 30 (H) 05/31/2017 1138   CO2 23 03/08/2017 1258   BUN 12 05/31/2017 1138   BUN 24.1 03/08/2017 1258   CREATININE 1.26 (H) 05/31/2017 1138   CREATININE 1.4 (H) 03/08/2017 1258      Component Value Date/Time   CALCIUM 9.5 05/31/2017 1138   CALCIUM 8.8 03/08/2017 1258   ALKPHOS 87 05/31/2017 1138   ALKPHOS 65 03/08/2017 1258   AST 12 05/31/2017 1138   AST 8 03/08/2017 1258   ALT 13 05/31/2017 1138   ALT 20 03/08/2017 1258   BILITOT <0.2 (L) 05/31/2017 1138   BILITOT 0.40 03/08/2017 1258       RADIOGRAPHIC STUDIES:  No results found.   ASSESSMENT/PLAN:  Renal cell carcinoma This is a very pleasant 72 year old African-American female with metastatic renal cell carcinoma that was initially diagnosed in 2010 and now presenting with bilateral pulmonary nodules. She was treated with stereotactic radiotherapy to hypermetabolic nodules without tissue diagnosis in the past. Imagingstudies showed hypermetabolic and enlarging bilateral pulmonary  nodules. CT-guided core biopsy of right pulmonary nodule was consistent with metastatic renal cell carcinoma. The patient isstartedtreatment with immunotherapy with ipilimumab and Nivolumab status post3cycles. She tolerated this treatment  well except for severe itchingand treatment was placed on hold. Itching improved with prednisone.  She is now off all steroids. She is now on single agent Nivolumab 480 mg IV every 4 weeks.  Status post 1 cycle.  She tolerated the first cycle fairly well.  Recommend that she proceed with cycle 2 as scheduled today.  Patient will have a restaging CT scan of the chest, abdomen, pelvis prior to her next visit.  She will follow-up in 4 weeks for evaluation prior to cycle #3 and to review her restaging CT scan results.    The patient was advised to call immediately if she has any other concerning symptoms in the interval. The patient voices understanding of current disease status and treatment options and is in agreement with the current care plan. All questions were answered. The patient knows to call the clinic with any problems, questions or concerns. We can certainly see the patient much sooner if necessary.  Orders Placed This Encounter  Procedures  . CT CHEST W CONTRAST    Standing Status:   Future    Standing Expiration Date:   06/01/2018    Order Specific Question:   If indicated for the ordered procedure, I authorize the administration of contrast media per Radiology protocol    Answer:   Yes    Order Specific Question:   Preferred imaging location?    Answer:   [facility]    Order Specific Question:   Radiology Contrast Protocol - do NOT remove file path    Answer:   \\[email or web address]    Order Specific Question:   Reason for Exam additional comments    Answer:   Renal cell ca. Restaging.  Marland Kitchen CT ABDOMEN PELVIS W CONTRAST    Standing Status:   Future    Standing Expiration Date:   06/01/2018    Order Specific  Question:   If indicated for the ordered procedure, I authorize the administration of contrast media per Radiology protocol    Answer:   Yes    Order Specific Question:   Preferred imaging location?    Answer:   [facility]    Order Specific Question:   Radiology Contrast Protocol - do NOT remove file path    Answer:   \\[email or web address]    Order Specific Question:   Reason for Exam additional comments    Answer:   Renal cell ca. Restaging.    Mikey Bussing, DNP, AGPCNP-BC, AOCNP 05/31/17

## 2017-05-31 NOTE — Patient Instructions (Signed)
[facility] Discharge Instructions for Patients Receiving Chemotherapy  Today you received the following chemotherapy agents Opdivo  To help prevent nausea and vomiting after your treatment, we encourage you to take your nausea medication as directed   If you develop nausea and vomiting that is not controlled by your nausea medication, call the clinic.   BELOW ARE SYMPTOMS THAT SHOULD BE REPORTED IMMEDIATELY:  *FEVER GREATER THAN 100.5 F  *CHILLS WITH OR WITHOUT FEVER  NAUSEA AND VOMITING THAT IS NOT CONTROLLED WITH YOUR NAUSEA MEDICATION  *UNUSUAL SHORTNESS OF BREATH  *UNUSUAL BRUISING OR BLEEDING  TENDERNESS IN MOUTH AND THROAT WITH OR WITHOUT PRESENCE OF ULCERS  *URINARY PROBLEMS  *BOWEL PROBLEMS  UNUSUAL RASH Items with * indicate a potential emergency and should be followed up as soon as possible.  Feel free to call the clinic should you have any questions or concerns. The clinic phone number is [phone].  Please show the CHEMO ALERT CARD at check-in to the Emergency Department and triage nurse.   

## 2017-05-31 NOTE — Assessment & Plan Note (Signed)
This is a very pleasant 72 year old African-American female with metastatic renal cell carcinoma that was initially diagnosed in 2010 and now presenting with bilateral pulmonary nodules. She was treated with stereotactic radiotherapy to hypermetabolic nodules without tissue diagnosis in the past. Imagingstudies showed hypermetabolic and enlarging bilateral pulmonary nodules. CT-guided core biopsy of right pulmonary nodule was consistent with metastatic renal cell carcinoma. The patient isstartedtreatment with immunotherapy with ipilimumab and Nivolumab status post3cycles. She tolerated this treatment well except for severe itchingand treatment was placed on hold. Itching improved with prednisone.  She is now off all steroids. She is now on single agent Nivolumab 480 mg IV every 4 weeks.  Status post 1 cycle.  She tolerated the first cycle fairly well.  Recommend that she proceed with cycle 2 as scheduled today.  Patient will have a restaging CT scan of the chest, abdomen, pelvis prior to her next visit.  She will follow-up in 4 weeks for evaluation prior to cycle #3 and to review her restaging CT scan results.    The patient was advised to call immediately if she has any other concerning symptoms in the interval. The patient voices understanding of current disease status and treatment options and is in agreement with the current care plan. All questions were answered. The patient knows to call the clinic with any problems, questions or concerns. We can certainly see the patient much sooner if necessary.

## 2017-06-04 ENCOUNTER — Telehealth: Payer: Self-pay | Admitting: Neurology

## 2017-06-04 NOTE — Telephone Encounter (Signed)
Patient called to let Dr. Carles Collet know that she stopped taking her medication for Tremors. Thanks

## 2017-06-04 NOTE — Telephone Encounter (Signed)
From last office note:  "she is already on primidone, 100 mg bid and not a candidate for beta blocker therapy due to her lungs.  Finds that daytime primidone makes her sleepy.  Could increase dose at night.  Talked to her about artane, and risks in this age group due to anticholinergic properties.  She would rather cautiously try this than use more of the primidone right now.  Start artane, 2 mg q day and will call her in 2 weeks.  Told to d/c it should have any cognitive SE"

## 2017-06-04 NOTE — Telephone Encounter (Signed)
Spoke with patient. She states she stopped the Artane because of dizziness. Not interested in changing dose of Primidone at this time. Just wanted to make [place] aware.   Dr. Carles Collet Juluis Rainier.

## 2017-06-11 ENCOUNTER — Telehealth: Payer: Self-pay | Admitting: Neurology

## 2017-06-11 NOTE — Telephone Encounter (Signed)
Melanie Best, I see this patient is on schedule for wed and it said 3 week follow up.  I believe that was supposed to be 3 months.  Also, she has d/c the medication that we gave her and she has little to no other drug options at this point.  Not sure wed appt will be valuable to her.  Call her and see what she would like to do please.  thanks

## 2017-06-11 NOTE — Telephone Encounter (Signed)
Spoke with patient. Appt cancelled. She wants to call back to reschedule.

## 2017-06-11 NOTE — Progress Notes (Deleted)
Subjective:   Melanie Best was seen in consultation in the movement disorder clinic at the request of [name], MD.  The evaluation is for tremor.  Pt is accompanied by her husband who supplements the history.  Pt reports that it has been going on for over 20 years.  It started in head but it went to the hands about a year ago.  It bothers her in the hands as she has trouble writing and dialing a phone.  The head doesn't bother her.  Her grandmother and brother both had tremor.  Pt was given primidone on 03/03/14 and took one pill and it made her dizzy and she didn't take it again.  She has not been on any other tremor medications.  Affected by caffeine:  unknown (drinks 2-3 cans pepsi per day) Affected by alcohol:  Doesn't drink Affected by stress:  No. Affected by fatigue:  No. Spills soup if on spoon:  Yes.   Spills glass of liquid if full:  No. Affects ADL's (tying shoes, brushing teeth, etc):  No.   07/03/14 update:  Pt is f/u today, accompanied by her husband who supplements the history.  Pt has hand tremor that is exacerbated by her meds for COPD.  The records that were made available to me were reviewed since last visit.  She remains on prednisone and duoneb.  I had her retry primidone last visit.  She states that it was helping some but not enough and on Tuesday her pcp increased her to bid dosing.  She isn't sure that it has helping more yet.  No SE.  No sleepiness with it.   She also has cervical dystonia but we both agreed to hold on treatment for that.  05/21/17 update: Patient is seen today in follow-up.  She is accompanied by her husband who supplements the history.  I have not seen patient in about 3 years.  I have reviewed primary care records.  She was last seen by her primary care physician on January, 2019 which was a mobility evaluation for power wheelchair.  She does have metastatic renal cell carcinoma, grade 3, now with pulmonary nodules.  Patient has been on steroids  for itching.  She is currently on primidone, 50 mg, 1 tablet 4 times per day.  When I last saw her several years ago I thought her tremor was multifactorial and likely exacerbated by, if not caused by, her multiple medications for her breathing and lungs.  She is on primidone 50 mg qid (states that she thinks that she is on 100 mg bid).  Medication is making her sleepy.   She has COPD.  She is on O2.  She uses albuterol.  Tremor is not worse but it is not better.  She has times when tremor is much worse than others. She went off primidone and felt like she was doing well but then she got worsening tremor and went back on it and "tremor has been worse."  No trouble dressing or eating due to tremor but sometimes she has trouble getting fork in the mouth.  Able to drink and pour without trouble.  Current/Previously tried tremor medications: klonopin (takes 1 mg bid for anxiety)  Current medications that may exacerbate tremor:  Prednisone (4mg  currently), albuterol/atrovent (uses tid),  Outside reports reviewed: historical medical records, lab reports and referral letter/letters.  No Known Allergies  Outpatient Encounter Medications as of 06/13/2017  Medication Sig  . aspirin 81 MG tablet Take 81 mg  by mouth daily.  . bimatoprost (LUMIGAN) 0.01 % SOLN Place 1 drop into both eyes at bedtime.   . brinzolamide (AZOPT) 1 % ophthalmic suspension Place 1 drop into both eyes 3 (three) times daily. Reported on 08/12/2015  . CALCIUM PO Take by mouth daily.  . clonazePAM (KLONOPIN) 1 MG tablet Take 1 mg by mouth 2 (two) times daily as needed. For anxiety  . dexamethasone (DECADRON) 4 MG tablet Take 4 mg by mouth daily.  Marland Kitchen diltiazem (CARDIZEM CD) 180 MG 24 hr capsule Take 180 mg by mouth daily.  . ergocalciferol (VITAMIN D2) 50000 UNITS capsule Take 50,000 Units by mouth once a week. Take on Monday  . esomeprazole (NEXIUM) 40 MG capsule Take 40 mg by mouth daily before breakfast.   . furosemide (LASIX) 20 MG  tablet Take 20 mg by mouth daily as needed. For fluid retention  . gabapentin (NEURONTIN) 100 MG capsule Take 300 mg by mouth daily.  . hydrOXYzine (ATARAX/VISTARIL) 25 MG tablet Take 25 mg by mouth as needed.  Marland Kitchen ipratropium-albuterol (DUONEB) 0.5-2.5 (3) MG/3ML SOLN Take 3 mLs by nebulization 4 (four) times daily as needed.  Marland Kitchen JANUVIA 50 MG tablet Take 50 mg by mouth daily.   Marland Kitchen lidocaine-prilocaine (EMLA) cream Apply 1 application topically as needed. Apply small amount on a cotton ball and apply to port site. Apply 1-2 hours prior to labs or chemo  . lovastatin (MEVACOR) 20 MG tablet Take 20 mg by mouth at bedtime.  . methocarbamol (ROBAXIN) 750 MG tablet Take 750 mg by mouth 2 (two) times daily as needed.  . OXYGEN Inhale 2 L into the lungs continuous.  . predniSONE (DELTASONE) 5 MG tablet Take 5 mg by mouth daily with breakfast.  . primidone (MYSOLINE) 50 MG tablet Take 100 mg by mouth 2 (two) times daily.   . promethazine (PHENERGAN) 25 MG tablet take 1 tablet by mouth every 6 hours if needed for nausea  . traMADol (ULTRAM) 50 MG tablet Take 1 tablet (50 mg total) by mouth 3 (three) times daily as needed.  . trihexyphenidyl (ARTANE) 2 MG tablet Take 1 tablet (2 mg total) by mouth daily.   No facility-administered encounter medications on file as of 06/13/2017.     Past Medical History:  Diagnosis Date  . Anemia   . Anxiety   . Arthritis    R leg  . Cancer ([place])    kidney  . Chronic fatigue 12/13/2016  . CKD (chronic kidney disease), stage II    removed R kidney- 2010- for Cancer, followed by Dr. Diona Fanti  . COPD (chronic obstructive pulmonary disease) ([facility])    uses O2-2 liters , continuously , followed by Dr. Chase Caller  . Cough 08-02-2015  . Diabetes mellitus, type 2 ([place])   . DM (diabetes mellitus) ([place]) 01/27/2012  . DVT (deep venous thrombosis), left    2011- treated /w coumadin   . Dyslipidemia   . Dysrhythmia   . Esophageal reflux   . GERD (gastroesophageal reflux  disease) 01/27/2012  . Glaucoma   . Goals of care, counseling/discussion 12/13/2016  . Hemoptysis 08-02-15  . History of radiation therapy 01/09/12,01/11/12,01/15/12,01/17/12,& 01/19/12   rul lung,50Gy/47fx  . HTN (hypertension) 01/27/2012  . Hyperlipidemia 01/27/2012  . Hypertension    sees Dr. Deforest Hoyles for PCP, denies ever having stress or ECHO, ?when & where she had  last  EKG  . Leukocytosis   . Lung cancer ([place])    NSCLC  . Osteopenia   . Renal  cell carcinoma of right kidney ([place]) 12/13/2016  . Shortness of breath   . Syncope   . Tremor   . Urinary incontinence   . Vitamin D deficiency     Past Surgical History:  Procedure Laterality Date  . BACK SURGERY     for pinched nerve, 2011, [facility]  . EYE SURGERY     cataracts removed, ?IOL  . FLEXIBLE BRONCHOSCOPY  11/29/2011  . IR FLUORO GUIDE PORT INSERTION RIGHT  01/30/2017  . IR US GUIDE VASC ACCESS RIGHT  01/30/2017  . removed blood clot     left upper abd quad   . right kidney removed    . right nephrectomy      Social History   Socioeconomic History  . Marital status: Married    Spouse name: Not on file  . Number of children: Not on file  . Years of education: Not on file  . Highest education level: Not on file  Occupational History  . Not on file  Social Needs  . Financial resource strain: Not on file  . Food insecurity:    Worry: Not on file    Inability: Not on file  . Transportation needs:    Medical: Not on file    Non-medical: Not on file  Tobacco Use  . Smoking status: Former Smoker    Packs/day: 1.00    Years: 35.00    Pack years: 35.00    Types: Cigarettes    Last attempt to quit: 03/20/2004    Years since quitting: 13.2  . Smokeless tobacco: Never Used  Substance and Sexual Activity  . Alcohol use: No  . Drug use: No  . Sexual activity: Not on file  Lifestyle  . Physical activity:    Days per week: Not on file    Minutes per session: Not on file  . Stress: Not on file  Relationships  .  Social connections:    Talks on phone: Not on file    Gets together: Not on file    Attends religious service: Not on file    Active member of club or organization: Not on file    Attends meetings of clubs or organizations: Not on file    Relationship status: Not on file  . Intimate partner violence:    Fear of current or ex partner: Not on file    Emotionally abused: Not on file    Physically abused: Not on file    Forced sexual activity: Not on file  Other Topics Concern  . Not on file  Social History Narrative  . Not on file    Family Status  Relation Name Status  . Mother  Alive       HTN  . Father  Deceased       emphysema  . Sister  Alive       cancer   . Brother  Deceased       throat cancer  . Brother  Deceased       lung cancer  . Brother  Deceased       lung cancer  . Brother  Deceased  . Sister  Deceased       emphysema  . Sister  Alive       healthy  . Daughter  Alive       breast cancer  . Daughter  Alive       healthy  . Son  [employer]  healthy  . Son  Alive       healthy  . Neg Hx  (Not Specified)    Review of Systems Review of Systems  Constitutional: Positive for malaise/fatigue.  HENT: Negative.   Eyes: Negative.   Respiratory: Positive for shortness of breath.   Cardiovascular: Negative.   Gastrointestinal: Negative.   Genitourinary: Negative.   Skin: Positive for itching.  Neurological: Positive for tingling (since chemo in arms - on gabapentin).  Endo/Heme/Allergies: Negative.   Psychiatric/Behavioral: Negative.      Objective:   VITALS:   There were no vitals filed for this visit. Gen:  Appears stated age and in NAD. HEENT:  Normocephalic, atraumatic. The mucous membranes are moist. The superficial temporal arteries are without ropiness or tenderness. Cardiovascular: Regular rate and rhythm. Lungs: CTAB.  On O2 Neck: There are no carotid bruits noted bilaterally.     Gen:  Appears stated age and in NAD. HEENT:   Normocephalic, atraumatic. The mucous membranes are moist. The superficial temporal arteries are without ropiness or tenderness. Cardiovascular: Regular rate and rhythm. Lungs: Clear to auscultation bilaterally. Neck: There are no carotid bruits noted bilaterally.  NEUROLOGICAL:  Orientation:  The patient is alert and oriented x 3.  Recent and remote memory are intact.  Attention span and concentration are normal.  Able to name objects and repeat without trouble.  Fund of knowledge is appropriate Cranial nerves: There is good facial symmetry.  Extraocular muscles are intact and visual fields are full to confrontational testing. Speech is fluent and clear. Soft palate rises symmetrically and there is no tongue deviation. Hearing is intact to conversational tone. Tone: Tone is good throughout. Sensation: Sensation is intact to light touch x 4 Coordination:  The patient has no difficulty with RAM's or FNF bilaterally. Motor: Strength is at least antigravity x 4 Gait and Station: deferred.  In [place] with O2  MOVEMENT EXAM: Tremor:  There is minor postural tremor.  No intention tremor.  Head tremor is noted in the "no" direction.  she has no significant difficulty with archimedes spirals and compared to 3 years ago, are not significantly worse  Lab Results  Component Value Date   TSH 0.605 05/31/2017        Assessment/Plan:   1.  Cervical dystonia  -This is the cause of her head tremor.  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  She is not bothered by this 2.  Hand tremor  -May have some ET, but likely exacerbated if not due to the multiple pulmonary medications used to treat her COPD.   This was my impression 3 years ago as well.  Not signficantly worse by examination.  On albuterol qid  -she is already on primidone, 100 mg bid and not a candidate for beta blocker therapy due to her lungs.  Finds that daytime primidone makes her sleepy.  Could  increase dose at night.  Talked to her about artane, and risks in this age group due to anticholinergic properties.  She would rather cautiously try this than use more of the primidone right now.  Start artane, 2 mg q day and will call her in 2 weeks.  Told to d/c it should have any cognitive SE 3.  Metastatic renal cell with pulmonary nodules  -seeing oncology. 4.  Follow up is anticipated in the next few months, sooner should new neurologic issues arise.  Much greater than 50% of this visit was  spent in counseling and coordinating care.  Total face to face time:  25 min

## 2017-06-13 ENCOUNTER — Ambulatory Visit: Payer: Medicare Other | Admitting: Neurology

## 2017-06-25 MED ORDER — LORAZEPAM 2 MG/ML IJ SOLN
INTRAMUSCULAR | Status: AC
  Filled 2017-06-25: qty 1

## 2017-06-26 ENCOUNTER — Ambulatory Visit (HOSPITAL_COMMUNITY)
Admission: RE | Admit: 2017-06-26 | Discharge: 2017-06-26 | Disposition: A | Discharge status: Home / Self Care | Payer: Medicare Other | Source: Ambulatory Visit | Attending: Oncology | Admitting: Oncology

## 2017-06-26 ENCOUNTER — Other Ambulatory Visit: Payer: Self-pay | Admitting: Oncology

## 2017-06-26 DIAGNOSIS — M879 Osteonecrosis, unspecified: Secondary | ICD-10-CM | POA: Insufficient documentation

## 2017-06-26 DIAGNOSIS — N200 Calculus of kidney: Secondary | ICD-10-CM | POA: Diagnosis not present

## 2017-06-26 DIAGNOSIS — I700 Atherosclerosis of aorta: Secondary | ICD-10-CM | POA: Insufficient documentation

## 2017-06-26 DIAGNOSIS — N281 Cyst of kidney, acquired: Secondary | ICD-10-CM | POA: Insufficient documentation

## 2017-06-26 DIAGNOSIS — I2510 Atherosclerotic heart disease of native coronary artery without angina pectoris: Secondary | ICD-10-CM | POA: Diagnosis not present

## 2017-06-26 DIAGNOSIS — C649 Malignant neoplasm of unspecified kidney, except renal pelvis: Secondary | ICD-10-CM | POA: Diagnosis present

## 2017-06-26 DIAGNOSIS — R918 Other nonspecific abnormal finding of lung field: Secondary | ICD-10-CM | POA: Diagnosis not present

## 2017-06-26 DIAGNOSIS — M5136 Other intervertebral disc degeneration, lumbar region: Secondary | ICD-10-CM | POA: Insufficient documentation

## 2017-06-26 DIAGNOSIS — I313 Pericardial effusion (noninflammatory): Secondary | ICD-10-CM | POA: Insufficient documentation

## 2017-06-26 MED ORDER — IOHEXOL 300 MG/ML  SOLN
100.0000 mL | Freq: Once | INTRAMUSCULAR | Status: AC | PRN
  Administered 2017-06-26: 100 mL via INTRAVENOUS

## 2017-06-26 MED ORDER — IOPAMIDOL (ISOVUE-300) INJECTION 61%: 100.0000 mL | Freq: Once | INTRAVENOUS | Status: DC | PRN

## 2017-06-26 MED ORDER — IOPAMIDOL (ISOVUE-300) INJECTION 61%
INTRAVENOUS | Status: AC
  Filled 2017-06-26: qty 150

## 2017-06-28 ENCOUNTER — Inpatient Hospital Stay: Payer: Medicare Other

## 2017-06-28 ENCOUNTER — Inpatient Hospital Stay: Payer: Medicare Other | Attending: Oncology | Admitting: Oncology

## 2017-06-28 ENCOUNTER — Telehealth: Payer: Self-pay | Admitting: Oncology

## 2017-06-28 VITALS — BP 135/62 | HR 95 | Temp 97.7°F | Resp 18 | Ht 65.0 in | Wt 154.9 lb

## 2017-06-28 DIAGNOSIS — L299 Pruritus, unspecified: Secondary | ICD-10-CM | POA: Insufficient documentation

## 2017-06-28 DIAGNOSIS — Z95828 Presence of other vascular implants and grafts: Secondary | ICD-10-CM

## 2017-06-28 DIAGNOSIS — C641 Malignant neoplasm of right kidney, except renal pelvis: Secondary | ICD-10-CM | POA: Insufficient documentation

## 2017-06-28 DIAGNOSIS — C7801 Secondary malignant neoplasm of right lung: Secondary | ICD-10-CM | POA: Insufficient documentation

## 2017-06-28 DIAGNOSIS — Z5112 Encounter for antineoplastic immunotherapy: Secondary | ICD-10-CM

## 2017-06-28 DIAGNOSIS — R5382 Chronic fatigue, unspecified: Secondary | ICD-10-CM

## 2017-06-28 DIAGNOSIS — Z7189 Other specified counseling: Secondary | ICD-10-CM

## 2017-06-28 DIAGNOSIS — C7802 Secondary malignant neoplasm of left lung: Secondary | ICD-10-CM | POA: Diagnosis not present

## 2017-06-28 LAB — CBC WITH DIFFERENTIAL/PLATELET
Basophils Absolute: 0 10*3/uL (ref 0.0–0.1)
Basophils Relative: 0 %
Eosinophils Absolute: 0 10*3/uL (ref 0.0–0.5)
Eosinophils Relative: 0 %
HEMATOCRIT: 31.9 % — AB (ref 34.8–46.6)
HEMOGLOBIN: 9.6 g/dL — AB (ref 11.6–15.9)
LYMPHS ABS: 1.5 10*3/uL (ref 0.9–3.3)
LYMPHS PCT: 12 %
MCH: 30.9 pg (ref 25.1–34.0)
MCHC: 30.1 g/dL — AB (ref 31.5–36.0)
MCV: 102.6 fL — AB (ref 79.5–101.0)
MONOS PCT: 3 %
Monocytes Absolute: 0.3 10*3/uL (ref 0.1–0.9)
NEUTROS ABS: 10.8 10*3/uL — AB (ref 1.5–6.5)
NEUTROS PCT: 85 %
Platelets: 274 10*3/uL (ref 145–400)
RBC: 3.11 MIL/uL — AB (ref 3.70–5.45)
RDW: 15 % — ABNORMAL HIGH (ref 11.2–14.5)
WBC: 12.7 10*3/uL — AB (ref 3.9–10.3)

## 2017-06-28 LAB — COMPREHENSIVE METABOLIC PANEL
ALK PHOS: 86 U/L (ref 40–150)
ALT: 20 U/L (ref 0–55)
ANION GAP: 11 (ref 3–11)
AST: 16 U/L (ref 5–34)
Albumin: 3.5 g/dL (ref 3.5–5.0)
BUN: 18 mg/dL (ref 7–26)
CALCIUM: 9.8 mg/dL (ref 8.4–10.4)
CO2: 26 mmol/L (ref 22–29)
CREATININE: 1.24 mg/dL — AB (ref 0.60–1.10)
Chloride: 105 mmol/L (ref 98–109)
GFR calc non Af Amer: 43 mL/min — ABNORMAL LOW (ref >60)
GFR, EST AFRICAN AMERICAN: 50 mL/min — AB (ref >60)
Glucose, Bld: 212 mg/dL — ABNORMAL HIGH (ref 70–140)
Potassium: 4.3 mmol/L (ref 3.5–5.1)
SODIUM: 142 mmol/L (ref 136–145)
Total Protein: 7.1 g/dL (ref 6.4–8.3)

## 2017-06-28 LAB — TSH: TSH: 0.311 u[IU]/mL (ref 0.308–3.960)

## 2017-06-28 MED ORDER — SODIUM CHLORIDE 0.9% FLUSH
10.0000 mL | INTRAVENOUS | Status: DC | PRN
  Administered 2017-06-28: 10 mL via INTRAVENOUS
  Filled 2017-06-28: qty 10

## 2017-06-28 MED ORDER — HEPARIN SOD (PORK) LOCK FLUSH 100 UNIT/ML IV SOLN
500.0000 [IU] | Freq: Once | INTRAVENOUS | Status: AC
  Administered 2017-06-28: 500 [IU] via INTRAVENOUS
  Filled 2017-06-28: qty 5

## 2017-06-28 NOTE — Progress Notes (Signed)
[place] OFFICE PROGRESS NOTE  [name], MD [address] [place] [address]  DIAGNOSIS:  1) Metastatic renal cell carcinoma, clear cell type, WHO nuclear grade 3 initially diagnosed in September 2010 and recently presented with bilateral pulmonary nodules. 2) history of renal cell carcinoma status post right nephrectomy in September 2010.  PRIOR THERAPY: 1)Stereotactic radiotherapy to pulmonary nodules based on the metabolic activity with no tissue diagnosis. This was performed under the care of Dr. Pablo Ledger. 2)first line treatment with immunotherapy with Nivolumab 3 MG/KG and Ipilumumab 1 mg/Kg every 3 weeks for 4 cycles followed by maintenance Nivolumab every 3 weeks. First dose 12/27/2016. Status post 3cycles.  Stopped due to severe itching.  CURRENT THERAPY: Nivolumab 480 mg IV every 4 weeks as a single agentstartingon 05/03/2017.  Status post 2 cycles.  INTERVAL HISTORY: EMONII WIENKE 72 y.o. female returns for routine follow-up visit accompanied by her husband.  She is feeling fine today and has no specific complaints.  Denies fevers and chills.  Denies chest pain, shortness breath, cough, hemoptysis.  Denies nausea, vomiting, constipation, diarrhea.  Denies skin rashes and itching.  Denies recent weight loss or night sweats.  The patient is here for evaluation prior to cycle 3 of Nivolumab and to review her restaging CT scan results.  MEDICAL HISTORY: Past Medical History:  Diagnosis Date  . Anemia   . Anxiety   . Arthritis    R leg  . Cancer ([facility])    kidney  . Chronic fatigue 12/13/2016  . CKD (chronic kidney disease), stage II    removed R kidney- 2010- for Cancer, followed by Dr. Diona Fanti  . COPD (chronic obstructive pulmonary disease) ([facility])    uses O2-2 liters , continuously , followed by Dr. Chase Caller  . Cough 08-02-2015  . Diabetes mellitus, type 2 ([facility])   . DM (diabetes mellitus) ([facility]) 01/27/2012  . DVT (deep venous  thrombosis), left    2011- treated /w coumadin   . Dyslipidemia   . Dysrhythmia   . Esophageal reflux   . GERD (gastroesophageal reflux disease) 01/27/2012  . Glaucoma   . Goals of care, counseling/discussion 12/13/2016  . Hemoptysis 08-02-15  . History of radiation therapy 01/09/12,01/11/12,01/15/12,01/17/12,& 01/19/12   rul lung,50Gy/83fx  . HTN (hypertension) 01/27/2012  . Hyperlipidemia 01/27/2012  . Hypertension    sees Dr. Deforest Hoyles for PCP, denies ever having stress or ECHO, ?when & where she had  last  EKG  . Leukocytosis   . Lung cancer ([facility])    NSCLC  . Osteopenia   . Renal cell carcinoma of right kidney ([facility]) 12/13/2016  . Shortness of breath   . Syncope   . Tremor   . Urinary incontinence   . Vitamin D deficiency     ALLERGIES:  has No Known Allergies.  MEDICATIONS:  Current Outpatient Medications  Medication Sig Dispense Refill  . aspirin 81 MG tablet Take 81 mg by mouth daily.    . bimatoprost (LUMIGAN) 0.01 % SOLN Place 1 drop into both eyes at bedtime.     . brinzolamide (AZOPT) 1 % ophthalmic suspension Place 1 drop into both eyes 3 (three) times daily. Reported on 08/12/2015    . CALCIUM PO Take by mouth daily.    . clonazePAM (KLONOPIN) 1 MG tablet Take 1 mg by mouth 2 (two) times daily as needed. For anxiety    . dexamethasone (DECADRON) 4 MG tablet Take 4 mg by mouth daily.    Marland Kitchen  diltiazem (CARDIZEM CD) 180 MG 24 hr capsule Take 180 mg by mouth daily.  0  . ergocalciferol (VITAMIN D2) 50000 UNITS capsule Take 50,000 Units by mouth once a week. Take on Monday    . esomeprazole (NEXIUM) 40 MG capsule Take 40 mg by mouth daily before breakfast.     . furosemide (LASIX) 20 MG tablet Take 20 mg by mouth daily as needed. For fluid retention    . gabapentin (NEURONTIN) 100 MG capsule Take 300 mg by mouth daily.  0  . hydrOXYzine (ATARAX/VISTARIL) 25 MG tablet Take 25 mg by mouth as needed.    Marland Kitchen ipratropium-albuterol (DUONEB) 0.5-2.5 (3) MG/3ML SOLN Take 3 mLs by  nebulization 4 (four) times daily as needed. 360 mL 4  . JANUVIA 50 MG tablet Take 50 mg by mouth daily.     Marland Kitchen lidocaine-prilocaine (EMLA) cream Apply 1 application topically as needed. Apply small amount on a cotton ball and apply to port site. Apply 1-2 hours prior to labs or chemo 30 g 0  . lovastatin (MEVACOR) 20 MG tablet Take 20 mg by mouth at bedtime.    . methocarbamol (ROBAXIN) 750 MG tablet Take 750 mg by mouth 2 (two) times daily as needed.  0  . OXYGEN Inhale 2 L into the lungs continuous.    . predniSONE (DELTASONE) 5 MG tablet Take 5 mg by mouth daily with breakfast.    . primidone (MYSOLINE) 50 MG tablet Take 100 mg by mouth 2 (two) times daily.     . promethazine (PHENERGAN) 25 MG tablet take 1 tablet by mouth every 6 hours if needed for nausea  0  . traMADol (ULTRAM) 50 MG tablet Take 1 tablet (50 mg total) by mouth 3 (three) times daily as needed. 21 tablet 0  . trihexyphenidyl (ARTANE) 2 MG tablet Take 1 tablet (2 mg total) by mouth daily. 90 tablet 0   Current Facility-Administered Medications  Medication Dose Route Frequency Provider Last Rate Last Dose  . sodium chloride flush (NS) 0.9 % injection 10 mL  10 mL Intravenous PRN Maryanna Shape, NP   10 mL at 06/28/17 1339    SURGICAL HISTORY:  Past Surgical History:  Procedure Laterality Date  . BACK SURGERY     for pinched nerve, 2011, here at [facility]  . EYE SURGERY     cataracts removed, ?IOL  . FLEXIBLE BRONCHOSCOPY  11/29/2011  . IR FLUORO GUIDE PORT INSERTION RIGHT  01/30/2017  . IR US GUIDE VASC ACCESS RIGHT  01/30/2017  . removed blood clot     left upper abd quad   . right kidney removed    . right nephrectomy      REVIEW OF SYSTEMS:   Review of Systems  Constitutional: Negative for appetite change, chills, fatigue, fever and unexpected weight change.  HENT:   Negative for mouth sores, nosebleeds, sore throat and trouble swallowing.   Eyes: Negative for eye problems and icterus.  Respiratory: Negative  for cough, hemoptysis, shortness of breath and wheezing.   Cardiovascular: Negative for chest pain and leg swelling.  Gastrointestinal: Negative for abdominal pain, constipation, diarrhea, nausea and vomiting.  Genitourinary: Negative for bladder incontinence, difficulty urinating, dysuria, frequency and hematuria.   Musculoskeletal: Negative for back pain, neck pain and neck stiffness.  Skin: Negative for itching and rash.  Neurological: Negative for dizziness, extremity weakness, headaches, light-headedness and seizures.  Hematological: Negative for adenopathy. Does not bruise/bleed easily.  Psychiatric/Behavioral: Negative for confusion, depression and sleep disturbance.  The patient is not nervous/anxious.     PHYSICAL EXAMINATION:  Blood pressure 135/62, pulse 95, temperature 97.7 F (36.5 C), temperature source Oral, resp. rate 18, height 5\' 5"  (1.651 m), weight 154 lb 14.4 oz (70.3 kg), SpO2 98 %.  ECOG PERFORMANCE STATUS: 1 - Symptomatic but completely ambulatory  Physical Exam  Constitutional: Oriented to person, place, and time and well-developed, well-nourished, and in no distress. No distress.  HENT:  Head: Normocephalic and atraumatic.  Mouth/Throat: Oropharynx is clear and moist. No oropharyngeal exudate.  Eyes: Conjunctivae are normal. Right eye exhibits no discharge. Left eye exhibits no discharge. No scleral icterus.  Neck: Normal range of motion. Neck supple.  Cardiovascular: Normal rate, regular rhythm, normal heart sounds and intact distal pulses.   Pulmonary/Chest: Effort normal and breath sounds normal. No respiratory distress. No wheezes. No rales.  Abdominal: Soft. Bowel sounds are normal. Exhibits no distension and no mass. There is no tenderness.  Musculoskeletal: Normal range of motion. Exhibits no edema.  Lymphadenopathy:    No cervical adenopathy.  Neurological: Alert and oriented to person, place, and time. Exhibits normal muscle tone. Coordination normal.  Tremor noted. Skin: Skin is warm and dry. No rash noted. Not diaphoretic. No erythema. No pallor.  Psychiatric: Mood, memory and judgment normal.  Vitals reviewed.  LABORATORY DATA: Lab Results  Component Value Date   WBC 12.7 (H) 06/28/2017   HGB 9.6 (L) 06/28/2017   HCT 31.9 (L) 06/28/2017   MCV 102.6 (H) 06/28/2017   PLT 274 06/28/2017      Chemistry      Component Value Date/Time   NA 142 06/28/2017 1149   NA 141 03/08/2017 1258   K 4.3 06/28/2017 1149   K 3.7 03/08/2017 1258   CL 105 06/28/2017 1149   CO2 26 06/28/2017 1149   CO2 23 03/08/2017 1258   BUN 18 06/28/2017 1149   BUN 24.1 03/08/2017 1258   CREATININE 1.24 (H) 06/28/2017 1149   CREATININE 1.4 (H) 03/08/2017 1258      Component Value Date/Time   CALCIUM 9.8 06/28/2017 1149   CALCIUM 8.8 03/08/2017 1258   ALKPHOS 86 06/28/2017 1149   ALKPHOS 65 03/08/2017 1258   AST 16 06/28/2017 1149   AST 8 03/08/2017 1258   ALT 20 06/28/2017 1149   ALT 20 03/08/2017 1258   BILITOT <0.2 (L) 06/28/2017 1149   BILITOT 0.40 03/08/2017 1258       RADIOGRAPHIC STUDIES:  Ct Abdomen Pelvis W Wo Contrast  Result Date: 06/27/2017 CLINICAL DATA:  Restaging of left kidney renal cell carcinoma EXAM: CT CHEST WITH CONTRAST CT ABDOMEN AND PELVIS WITH AND WITHOUT CONTRAST TECHNIQUE: Multidetector CT imaging of the chest was performed during intravenous contrast administration. Multidetector CT imaging of the abdomen and pelvis was performed following the standard protocol before and during bolus administration of intravenous contrast. CONTRAST:  ISOVUE-300 IOPAMIDOL (ISOVUE-300) INJECTION 61%, 182mL OMNIPAQUE IOHEXOL 300 MG/ML SOLN COMPARISON:  Multiple exams, including 05/03/2016 and 04/12/2016 FINDINGS: CT CHEST FINDINGS Cardiovascular: Coronary, aortic arch, and branch vessel atherosclerotic vascular disease. Trace pericardial effusion, mildly increased from prior. Right Port-A-Cath tip: Lower SVC. Peribronchovascular tumor in the  left lower lobe exert some local mass effect on left lower lobe pulmonary arterial branches for example on image 65/11. Mediastinum/Nodes: No pathologic adenopathy. Lungs/Pleura: Tumor tracking along margins of the left lower lobe pulmonary artery appears essentially stable, with a nodular component measuring 1.7 by 1.5 cm on image 64/11 and previously 1.8 by 1.4 cm. Similarly there  is some low-grade peribronchovascular nodularity in the right suprahilar region which is difficult to measure, along with some chronic atelectasis in the right upper lobe. Posterior basal segment right lower lobe nodule on image 135/15 measures 3 mm in diameter, previously 2 mm. Small subpleural nodules in the apicoposterior segment left upper lobe are stable. Musculoskeletal: Unremarkable CT ABDOMEN AND PELVIS FINDINGS Hepatobiliary: Unremarkable Pancreas: Unremarkable Spleen: Unremarkable Adrenals/Urinary Tract: Prior right nephrectomy. Adrenal glands unremarkable. Heterogeneously enhancing left kidney lower pole mass measuring 6.2 by 6.0 cm on image 59/6 (formerly 5.6 by 4.8 cm). There has been propagation of tumor thrombus now distending the left renal vein and extending almost to the IVC. Collateralization and distention of the left ovarian vein due to the renal vein occlusion. Numerous other cysts of varying complexity are present in the left kidney. There is a 2 mm left kidney lower pole nonobstructive renal calculus on image 49/3. A 3 mm calcification near the left proximal ureter on image 129/11 is thought to be vascular rather than inside the ureter. Stomach/Bowel: Prominent stool throughout the colon favors constipation. Vascular/Lymphatic: Aortoiliac atherosclerotic vascular disease. Indistinctly marginated left periaortic lymph node 0.9 cm in short axis on image 114/11, previously 0.4 cm. Reproductive: Retroverted uterus.  Adnexa unremarkable. Other: No supplemental non-categorized findings. Musculoskeletal: There is mild  grade 1 degenerative anterolisthesis at L4-5 along with degenerative disc disease at L4-5 and L5-S1. Mild chronic avascular necrosis along a small portion of the left femoral head on image 47/12, without flattening or collapse. IMPRESSION: 1. Enlarging left kidney lower pole mass, now 6.2 cm in diameter, with progression and propagation of the tumor thrombus in the left renal vein which currently distends the vein and extends almost to the level of the IVC. Appearance compatible with renal cell carcinoma. There is some borderline new left periaortic adenopathy at the level of the left kidney. 2. Stable appearance of perivascular tumor nodules in the left lower lobe and right upper lobe. 3. Other imaging findings of potential clinical significance: Aortic Atherosclerosis (ICD10-I70.0). Coronary atherosclerosis. Trace pericardial effusion. Stable left renal cysts of varying complexity. Stable 2 mm nonobstructive left kidney lower pole calculus. Prominent stool throughout the colon favors constipation. Lower lumbar degenerative disc disease. 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 Electronically Signed   By: Van Clines M.D.   On: 06/27/2017 07:56   Ct Chest W Contrast  Result Date: 06/27/2017 CLINICAL DATA:  Restaging of left kidney renal cell carcinoma EXAM: CT CHEST WITH CONTRAST CT ABDOMEN AND PELVIS WITH AND WITHOUT CONTRAST TECHNIQUE: Multidetector CT imaging of the chest was performed during intravenous contrast administration. Multidetector CT imaging of the abdomen and pelvis was performed following the standard protocol before and during bolus administration of intravenous contrast. CONTRAST:  ISOVUE-300 IOPAMIDOL (ISOVUE-300) INJECTION 61%, 125mL OMNIPAQUE IOHEXOL 300 MG/ML SOLN COMPARISON:  Multiple exams, including 05/03/2016 and 04/12/2016 FINDINGS: CT CHEST FINDINGS Cardiovascular: Coronary, aortic arch, and branch vessel atherosclerotic vascular disease.  Trace pericardial effusion, mildly increased from prior. Right Port-A-Cath tip: Lower SVC. Peribronchovascular tumor in the left lower lobe exert some local mass effect on left lower lobe pulmonary arterial branches for example on image 65/11. Mediastinum/Nodes: No pathologic adenopathy. Lungs/Pleura: Tumor tracking along margins of the left lower lobe pulmonary artery appears essentially stable, with a nodular component measuring 1.7 by 1.5 cm on image 64/11 and previously 1.8 by 1.4 cm. Similarly there is some low-grade peribronchovascular nodularity in the right suprahilar region which is difficult to measure, along  with some chronic atelectasis in the right upper lobe. Posterior basal segment right lower lobe nodule on image 135/15 measures 3 mm in diameter, previously 2 mm. Small subpleural nodules in the apicoposterior segment left upper lobe are stable. Musculoskeletal: Unremarkable CT ABDOMEN AND PELVIS FINDINGS Hepatobiliary: Unremarkable Pancreas: Unremarkable Spleen: Unremarkable Adrenals/Urinary Tract: Prior right nephrectomy. Adrenal glands unremarkable. Heterogeneously enhancing left kidney lower pole mass measuring 6.2 by 6.0 cm on image 59/6 (formerly 5.6 by 4.8 cm). There has been propagation of tumor thrombus now distending the left renal vein and extending almost to the IVC. Collateralization and distention of the left ovarian vein due to the renal vein occlusion. Numerous other cysts of varying complexity are present in the left kidney. There is a 2 mm left kidney lower pole nonobstructive renal calculus on image 49/3. A 3 mm calcification near the left proximal ureter on image 129/11 is thought to be vascular rather than inside the ureter. Stomach/Bowel: Prominent stool throughout the colon favors constipation. Vascular/Lymphatic: Aortoiliac atherosclerotic vascular disease. Indistinctly marginated left periaortic lymph node 0.9 cm in short axis on image 114/11, previously 0.4 cm. Reproductive:  Retroverted uterus.  Adnexa unremarkable. Other: No supplemental non-categorized findings. Musculoskeletal: There is mild grade 1 degenerative anterolisthesis at L4-5 along with degenerative disc disease at L4-5 and L5-S1. Mild chronic avascular necrosis along a small portion of the left femoral head on image 47/12, without flattening or collapse. IMPRESSION: 1. Enlarging left kidney lower pole mass, now 6.2 cm in diameter, with progression and propagation of the tumor thrombus in the left renal vein which currently distends the vein and extends almost to the level of the IVC. Appearance compatible with renal cell carcinoma. There is some borderline new left periaortic adenopathy at the level of the left kidney. 2. Stable appearance of perivascular tumor nodules in the left lower lobe and right upper lobe. 3. Other imaging findings of potential clinical significance: Aortic Atherosclerosis (ICD10-I70.0). Coronary atherosclerosis. Trace pericardial effusion. Stable left renal cysts of varying complexity. Stable 2 mm nonobstructive left kidney lower pole calculus. Prominent stool throughout the colon favors constipation. Lower lumbar degenerative disc disease. 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 Electronically Signed   By: Van Clines M.D.   On: 06/27/2017 07:56     ASSESSMENT/PLAN:  Renal cell carcinoma of right kidney [facility]) This is a very pleasant27year old African-American female with metastatic renal cell carcinoma that was initially diagnosed in 2010 and now presenting with bilateral pulmonary nodules. She was treated with stereotactic radiotherapy to hypermetabolic nodules without tissue diagnosis in the past. Imagingstudies showed hypermetabolic and enlarging bilateral pulmonary nodules. CT-guided core biopsy of right pulmonary nodule was consistent with metastatic renal cell carcinoma. The patient isstartedtreatment with immunotherapy with  ipilimumab and Nivolumab status post3cycles. Shetoleratedthis treatment well except for severe itchingand treatment was placed on hold. Itching improved withprednisone. She is now off all steroids. She is now on single agent Nivolumab 480 mg IV every 4 weeks. Status post 2 cycles.    She is tolerating her treatment fairly well. The patient had a recent restaging CT scan is here to discuss the results.  The patient was seen Dr. Julien Nordmann.  Restaging CT scan results were discussed with the patient and her husband which show evidence for disease progression.  Recommend that she discontinue her treatment with Nivolumab at this time.  We discussed proceeding with treatment with either Cabometyx or Votrient.  Will place referral to our oral chemotherapy pharmacist to review medication  interactions and to work on the process for securing this medication for this patient.  The patient was advised to begin the medication as soon as she receives it.  The oral chemotherapy pharmacist will follow up with the patient to discuss side effects and how to take this medication. We will see the patient back in approximately 3 weeks to recheck her labs and evaluate side effects related to her treatment.  The patient was advised to call immediately if she has any other concerning symptoms in the interval. The patient voices understanding of current disease status and treatment options and is in agreement with the current care plan. All questions were answered. The patient knows to call the clinic with any problems, questions or concerns. We can certainly see the patient much sooner if necessary.   Orders Placed This Encounter  Procedures  . CBC with Differential (Cancer Center Only)    Standing Status:   Future    Standing Expiration Date:   06/29/2018  . CMP ([place] only)    Standing Status:   Future    Standing Expiration Date:   06/29/2018   Mikey Bussing, DNP, AGPCNP-BC,  AOCNP 07/03/17  ADDENDUM: Hematology/Oncology Attending: I had a face-to-face encounter with the patient.  I recommended her care plan.  This is a very pleasant 72 years old African-American female with metastatic renal cell carcinoma status post treatment with immunotherapy initially with ipilimumab and Nivolumab discontinued secondary to intolerance with severe pruritus.  The patient was treated with additional cycles of single agent nivolumab and tolerating it much better. Unfortunately recent imaging studies showed evidence for disease progression.  I had a lengthy discussion with the patient and her husband about her current condition and treatment options.  I recommended for the patient to discontinue her current treatment with Nivolumab. I discussed with the patient other treatment options including palliative care versus consideration with oral tyrosine kinase inhibitor including Cabometyx at a reduced dose of 40 mg p.o. daily versus treatment with Votrient.  The patient is interested in proceeding with the oral drug and we will start her on Cabometyx 40 mg p.o. Daily. We discussed with the patient the adverse effect of this treatment. She will come back for follow-up visit in 3 weeks for evaluation and repeat blood work. The patient was advised to call immediately if she has any concerning symptoms in the interval.  Disclaimer: This note was dictated with voice recognition software. Similar sounding words can inadvertently be transcribed and may be missed upon review. Eilleen Kempf, MD 07/06/17

## 2017-06-28 NOTE — Telephone Encounter (Signed)
Gave patient avs and calendar with appts per 4/11 los - moved out an extra week due to patient wanting afternoon appointments. Put in next available.

## 2017-06-28 NOTE — Patient Instructions (Signed)
Cabozantinib capsules and tablets What is this medicine? CABOZANTINIB (KA boe ZAN ti nib) is medicine that targets proteins in cancer cells and stops the cancer cell from growing. It is used to treat thyroid cancer and renal cell cancer. This medicine may be used for other purposes; ask your health care provider or pharmacist if you have questions. COMMON BRAND NAME(S): Cabometyx, COMETRIQ What should I tell my health care provider before I take this medicine? They need to know if you have any of these conditions: -bleeding disorders -high blood pressure -liver disease -recent surgery -skin conditions or sensitivity -an unusual or allergic reaction to cabozantinib, other medicines, foods, dyes, or preservatives -pregnant or trying to get pregnant -breast-feeding How should I use this medicine? Take this medicine by mouth with a glass of water. Follow the directions on the prescription label. Do not take with food. Do not cut, crush, or chew this medicine. Do not take with grapefruit juice. Take your medicine at regular intervals. Do not take it more often than directed. Do not stop taking except on your doctor's advice. Talk to your pediatrician regarding the use of this medicine in children. Special care may be needed. Overdosage: If you think you have taken too much of this medicine contact a poison control center or emergency room at once. NOTE: This medicine is only for you. Do not share this medicine with others. What if I miss a dose? If you miss a dose, take it as soon as you can. If your next dose is to be taken in less than 12 hours, then do not take the missed dose. Take the next dose at your regular time. Do not take double or extra doses. What may interact with this medicine? This medicine may interact with the following medications: -atazanavir -carbamazepine -clarithromycin -conivaptan -grapefruit  juice -indinavir -itraconazole -ketoconazole -nefazodone -nelfinavir -phenobarbital -phenytoin -rifabutin -rifampin -rifapentine -ritonavir -saquinavir -St. John's Wort -telithromycin -voriconazole This list may not describe all possible interactions. Give your health care provider a list of all the medicines, herbs, non-prescription drugs, or dietary supplements you use. Also tell them if you smoke, drink alcohol, or use illegal drugs. Some items may interact with your medicine. What should I watch for while using this medicine? This drug may make you feel generally unwell. This is not uncommon, as chemotherapy can affect healthy cells as well as cancer cells. Report any side effects. Continue your course of treatment even though you feel ill unless your doctor tells you to stop. If you are going to have surgery or any other procedures, tell your doctor you are taking this medicine. Do not become pregnant while taking this medicine or for 4 months after stopping it. Women should inform their doctor if they wish to become pregnant or think they might be pregnant. Men should not father a child while taking this medicine and for 4 months after stopping it. There is a potential for serious side effects to an unborn child. Talk to your health care professional or pharmacist for more information. Do not breast-feed an infant while taking this medicine or for 4 months after the last dose. What side effects may I notice from receiving this medicine? Side effects that you should report to your doctor or health care professional as soon as possible: -allergic reactions like skin rash, itching or hives, swelling of the face, lips, or tongue -bloody or black, tarry stools -breathing problems -changes in vision -chest pain or chest tightness -confusion, trouble speaking or understanding -feeling  faint or lightheaded, falls -jaw pain, especially after dental work -red or dark-brown urine -redness,  swelling, or sores on hands or feet -severe headaches -shortness of breath, chest pain, swelling in a leg -spitting up blood or brown material that looks like coffee grounds -stomach pain -sudden numbness or weakness of the face, arm or leg -sweating -swelling of the ankles, feet, hands -trouble swallowing -trouble walking, dizziness, loss of balance or coordination -unusual bruising or bleeding from the eye, gums, or nose Side effects that usually do not require medical attention (report to your doctor or health care professional if they continue or are bothersome): -diarrhea -loss of appetite -nausea, vomiting -sore throat -weight loss This list may not describe all possible side effects. Call your doctor for medical advice about side effects. You may report side effects to FDA at 1-800-FDA-1088. Where should I keep my medicine? Keep out of the reach of children. Store between 20 and 25 degrees C (68 and 77 degrees F). Throw away any unused medicine after the expiration date. NOTE: This sheet is a summary. It may not cover all possible information. If you have questions about this medicine, talk to your doctor, pharmacist, or health care provider.  2018 Elsevier/Gold Standard (2016-02-23 13:42:22)  Pazopanib oral tablets What is this medicine? PAZOPANIB (paz [place] pa nib) is a biologic drug used to treat kidney cancer and sarcoma. This medicine may be used for other purposes; ask your health care provider or pharmacist if you have questions. COMMON BRAND NAME(S): Votrient What should I tell my health care provider before I take this medicine? They need to know if you have any of these conditions: -bleeding problems -have had recent surgery (within 7 days) or are having surgery -heart disease -high blood pressure -history of stroke -liver disease -protein in your urine -stomach or intestine problems -thyroid problems -an unusual or allergic reaction to pazopanib, other  medicines, foods, dyes, or preservatives -pregnant or trying to get pregnant -breast-feeding How should I use this medicine? Take this medicine by mouth with a glass of water. Follow the directions on the prescription label. Do not cut, crush or chew this medicine. Take this medicine on an empty stomach, at least 1 hour before or 2 hours after meals. Do not take with food. Do not take with grapefruit juice. Take your medicine at regular intervals. Do not take it more often than directed. Do not stop taking except on your doctor's advice. A special MedGuide will be given to you by the pharmacist with each prescription and refill. Be sure to read this information carefully each time. Talk to your pediatrician regarding the use of this medicine in children. Special care may be needed. Overdosage: If you think you have taken too much of this medicine contact a poison control center or emergency room at once. NOTE: This medicine is only for you. Do not share this medicine with others. What if I miss a dose? If you miss a dose, take it as soon as you can. If your next dose is to be taken in less than 12 hours, then do not take the missed dose. Take the next dose at your regular time. Do not take double or extra doses. What may interact with this medicine? Do not take this medication with any of the following medications: -rifampin This medicine may also interact with the following medications: -certain medicines for stomach problems like cimetidine, famotidine, omeprazole, lansoprazole -clarithromycin -dextromethorphan -grapefruit or grapefruit juice -ketoconazole -lapatinib -certain medicines for irregular  heart beat like amiodarone, bepridil, dofetilide, encainide, flecainide, propafenone, quinidine -midazolam -paclitaxel -ritonavir This list may not describe all possible interactions. Give your health care provider a list of all the medicines, herbs, non-prescription drugs, or dietary  supplements you use. Also tell them if you smoke, drink alcohol, or use illegal drugs. Some items may interact with your medicine. What should I watch for while using this medicine? Visit your doctor for regular check ups. You will need to have blood work while you are taking this medicine. Call your doctor or health care professional for advice if you get a fever, chills or sore throat, or other symptoms of a cold or flu. Do not treat yourself. This drug decreases your body's ability to fight infections. Try to avoid being around people who are sick. This medicine may increase your risk to bruise or bleed. Call your doctor or health care professional if you notice any unusual bleeding. Do not become pregnant while taking this medicine or for at least 2 weeks after stopping it. Women should inform their doctor if they wish to become pregnant or think they might be pregnant. Men should not father a child while taking this medicine and for at least 2 weeks after stopping it. There is a potential for serious side effects to an unborn child. Talk to your health care professional or pharmacist for more information. Do not breast-feed an infant while taking this medicine or for 2 weeks after stopping it. This medicine may interfere with the ability to have a child. You should talk with your doctor or health care professional if you are concerned about your fertility. If you are going to have surgery or any other procedures, tell your doctor you are taking this medicine. What side effects may I notice from receiving this medicine? Side effects that you should report to your doctor or health care professional as soon as possible: -abdominal pain -allergic reactions like skin rash, itching or hives, swelling of the face, lips, or tongue -black, tarry stool -breathing problems -changes in vision -chest pain -confusion, trouble speaking or understanding -cough -fast, irregular heartbeat -fever or chills,  sore throat -seizures -severe headaches -shortness or breath -sudden numbness or weakness of the face, arm or leg -trouble passing urine or change in the amount of urine -trouble walking, dizziness, loss of balance or coordination -unusual bleeding or bruising -unusually weak or tired -yellowing of the eyes or skin Side effects that usually do not require medical attention (report to your doctor or health care professional if they continue or are bothersome): -change in hair color -loose or watery stool -loss of appetite -nausea, vomiting -unusually weak or tired This list may not describe all possible side effects. Call your doctor for medical advice about side effects. You may report side effects to FDA at 1-800-FDA-1088. Where should I keep my medicine? Keep out of the reach of children. Store at room temperature between 15 and 30 degrees C (59 and 86 degrees F). Throw away any unused medicine after the expiration date. NOTE: This sheet is a summary. It may not cover all possible information. If you have questions about this medicine, talk to your doctor, pharmacist, or health care provider.  2018 Elsevier/Gold Standard (2015-08-20 11:03:11)

## 2017-06-29 ENCOUNTER — Telehealth: Payer: Self-pay | Admitting: Pharmacist

## 2017-06-29 ENCOUNTER — Telehealth: Payer: Self-pay | Admitting: Pharmacy Technician

## 2017-06-29 NOTE — Telephone Encounter (Signed)
Oral Oncology Patient Advocate Encounter  Received notification from [facility] that prior authorization for Cabometyx is required.  PA submitted on CoverMyMeds Key [id number] Status is pending  Oral Oncology Clinic will continue to follow.  Melanie Best. [name], [place] Patient [name] [phone] 06/29/2017 10:31 AM

## 2017-06-29 NOTE — Telephone Encounter (Signed)
Oral Oncology Pharmacist Encounter  Received referral for Votrient (pazapanib) versus Cabometyx (cabozantinib) for the treatment of progressive, metastatic renal cell carcinoma, planned duration until disease progression or unacceptable toxicity.  Labs from Epic assessed, acceptable for treatment with either agent.  SCr=1.24, est CrCl ~ 45 mL/min, no dose adjustment for renal dysfunction provided by the manufacturer for either agent.  BPs in Epic reviewed, WNL, will continue to monitor. TSH is being monitored due to treatment with immunotherapy, remains WNL  No recent assessment of UP, will be monitored while on treatment with TKI  No assessment of EKG since 2013, recommend for baseline assessment and periodic monitoring if initiating therapy with Votrient, is not needed with Cabometyx  Current medication list in Epic reviewed, DDIs with Votrient identified:  Votrient and Nexium: Category X interaction: Increases in gastric pH dramatically affect the absorption of Votrient, use is contraindicated with PPI  Votrient and primidone: Category X interaction: Primidone is a CYP3A4 inducer, leading to dramatically decreased systemic exposure to the Votrient.  Current medication list in Epic reviewed, no significant DDIs with Cabometyx identified.  I would recommend initiation of Cabometyx at 40 mg daily, with hope to increase the dose to 60 mg daily if tolerated after 1-2 months. This is based on above medication interactions and increased monitoring that is recommended with Votrient.  Oral Oncology Clinic will submit for insurance authorization of the Cabometyx. We will discussed recommendation with MD. Once prescription is received from MD, we will triaged to appropriate specialty pharmacy for dispensing.  Oral Oncology Clinic will continue to follow for insurance authorization, copayment issues, initial counseling and start date.  Johny Drilling, PharmD, BCPS, BCOP 06/29/2017 9:28 AM Oral  Oncology Clinic [phone]

## 2017-07-02 NOTE — Telephone Encounter (Signed)
Pt calling for update on Cabometyx. I told her it requires prior auth and may be a few more days.

## 2017-07-03 ENCOUNTER — Other Ambulatory Visit: Payer: Self-pay | Admitting: Medical Oncology

## 2017-07-03 ENCOUNTER — Other Ambulatory Visit: Payer: Self-pay | Admitting: Pharmacist

## 2017-07-03 ENCOUNTER — Encounter: Payer: Self-pay | Admitting: Oncology

## 2017-07-03 ENCOUNTER — Other Ambulatory Visit: Payer: Self-pay | Admitting: Internal Medicine

## 2017-07-03 DIAGNOSIS — C641 Malignant neoplasm of right kidney, except renal pelvis: Secondary | ICD-10-CM

## 2017-07-03 MED ORDER — CABOZANTINIB S-MALATE 40 MG PO TABS: 40.0000 mg | ORAL_TABLET | Freq: Every day | ORAL | 1 refills | Status: DC

## 2017-07-03 NOTE — Telephone Encounter (Signed)
Oral Oncology Patient Advocate Encounter  Prior Authorization for Cabometyx has been approved.    PA# [id number] Effective dates: 06/29/2017 through 03/19/2018  Oral Oncology Clinic will continue to follow.   Fabio Asa. [name], [place] Patient [place] [phone] 07/03/2017 10:17 AM

## 2017-07-03 NOTE — Assessment & Plan Note (Signed)
This is a very pleasant22year old African-American female with metastatic renal cell carcinoma that was initially diagnosed in 2010 and now presenting with bilateral pulmonary nodules. She was treated with stereotactic radiotherapy to hypermetabolic nodules without tissue diagnosis in the past. Imagingstudies showed hypermetabolic and enlarging bilateral pulmonary nodules. CT-guided core biopsy of right pulmonary nodule was consistent with metastatic renal cell carcinoma. The patient isstartedtreatment with immunotherapy with ipilimumab and Nivolumab status post3cycles. Shetoleratedthis treatment well except for severe itchingand treatment was placed on hold. Itching improved withprednisone. She is now off all steroids. She is now on single agent Nivolumab 480 mg IV every 4 weeks. Status post 2 cycles.    She is tolerating her treatment fairly well. The patient had a recent restaging CT scan is here to discuss the results.  The patient was seen Dr. Julien Nordmann.  Restaging CT scan results were discussed with the patient and her husband which show evidence for disease progression.  Recommend that she discontinue her treatment with Nivolumab at this time.  We discussed proceeding with treatment with either Cabometyx or Votrient.  Will place referral to our oral chemotherapy pharmacist to review medication interactions and to work on the process for securing this medication for this patient.  The patient was advised to begin the medication as soon as she receives it.  The oral chemotherapy pharmacist will follow up with the patient to discuss side effects and how to take this medication. We will see the patient back in approximately 3 weeks to recheck her labs and evaluate side effects related to her treatment.  The patient was advised to call immediately if she has any other concerning symptoms in the interval. The patient voices understanding of current disease status and treatment options and  is in agreement with the current care plan. All questions were answered. The patient knows to call the clinic with any problems, questions or concerns. We can certainly see the patient much sooner if necessary.

## 2017-07-04 ENCOUNTER — Telehealth: Payer: Self-pay | Admitting: Pharmacist

## 2017-07-04 ENCOUNTER — Telehealth: Payer: Self-pay | Admitting: Pharmacy Technician

## 2017-07-04 MED FILL — CABOMETYX 40 MG TABLET: 40 | 30 days supply | Qty: 30 | Fill #0

## 2017-07-04 NOTE — Telephone Encounter (Signed)
Oral Oncology Patient Advocate Encounter  Was successful in securing patient an $ 5900 grant from [facility]) to provide copayment coverage for her Cabometyx.  This will keep the out of pocket expense at $0.    I have spoken with the patient.    The billing information is as follows and has been shared with [place].   Member ID: [id number] Group ID: [id number] RxBin: [id number] Dates of Eligibility: 04/05/2017 through 07/04/2018  Fabio Asa. [name], [place] Patient [place] [phone] 07/04/2017 10:47 AM

## 2017-07-04 NOTE — Telephone Encounter (Signed)
Oral Oncology Patient Advocate Encounter  Was successful in securing patient a $10,000 grant from [facility] to provide copayment coverage for Cabometyx. This will keep the out of pocket expense at $0.   I have spoken with the patient..   The billing information is as follows and has been shared with [place].   Member ID: [id number] Group ID: [id number] RxBin: [id number] PCN: [id number]  Fabio Asa. [name], [place] Patient [place] [phone] 07/04/2017 10:49 AM

## 2017-07-04 NOTE — Telephone Encounter (Signed)
Oral Chemotherapy Pharmacist Encounter   I spoke with patient for overview of: Cabometyx (cabozantinib) for the treatment of progressive, metastatic renal cell carcinoma, planned duration until disease progression or unacceptable toxicity.   Counseled patient on administration, dosing, side effects, monitoring, drug-food interactions, safe handling, storage, and disposal.  Patient will take Cabometyx 40mg  tablets, 1 tablet (40mg ) by mouth once daily on an empty stomach, 1 hour before or 2 hours after a meal.  Patient instructed that her dose of Cabometyx may increase to 60 mg tablets, 1 tablet by mouth once daily if she is tolerating the 40 mg daily dose without issue for 1-2 months.  Patient knows to avoid grapefruit and grapefruit juice.  Cabometyx start date: 07/04/17 at 4pm  Side effects include but not limited to: diarrhea, nausea, decreased appetite, fatigue, hypertension, hand-foot syndrome, decreased blood counts, and electrolyte abnormalities. Patient will obtain loperamide for at home use and will call the office if daily diarrhea develops.    Reviewed with patient importance of keeping a medication schedule and plan for any missed doses.  Mrs. Trine voiced understanding and appreciation.   All questions answered. Medication reconciliation performed and medication/allergy list updated.  Oral Oncology Patient Advocate was successful in obtaining copayment foundation support for patient's copayment of Cabometyx. Billing information has been shared with [facility] for dispensing of Cabometyx.  We will work with the pharmacy to dispense the Cabometyx today (07/04/17) and patient will go by the pharmacy to pick it up. She plans on starting her Cabometyx at 4 PM today in between lunch and dinner, to ensure that she takes her medication on an empty stomach.  Confirmed office visits with patient for 07/25/2017 Patient will call the office between now and then if she  develops any issues after starting the medication.  Patient understands and is in agreement with above plan.  Patient knows to call the office with questions or concerns. Oral Oncology Clinic will continue to follow.  Thank you,  Johny Drilling, PharmD, BCPS, BCOP 07/04/2017 10:23 AM Oral Oncology Clinic [phone]

## 2017-07-17 ENCOUNTER — Encounter: Payer: Self-pay | Admitting: Internal Medicine

## 2017-07-17 ENCOUNTER — Ambulatory Visit: Payer: Medicare Other | Admitting: Internal Medicine

## 2017-07-17 VITALS — BP 122/72 | HR 97 | Ht 65.0 in | Wt 152.4 lb

## 2017-07-17 DIAGNOSIS — J9611 Chronic respiratory failure with hypoxia: Secondary | ICD-10-CM

## 2017-07-17 DIAGNOSIS — J449 Chronic obstructive pulmonary disease, unspecified: Secondary | ICD-10-CM

## 2017-07-17 NOTE — Patient Instructions (Addendum)
ICD-10-CM   1. 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 Chronic hypoxemic respiratory failure ([facility]) J96.11     Stable copd  Plan continue o2, duoneb and prednisone as before  Followup 6 months or sooner if needed

## 2017-07-17 NOTE — Progress Notes (Signed)
Subjective:     Patient ID: Melanie Best, female   DOB: 1945-11-13, 72 y.o.   MRN: [id number]  HPI     #Rt renal cell cancer Aug 2010 - Dr Diona Fanti Large right renal cell carcinoma with extension into the right renal vein and inferior vena cava  #CRI  - creat 1.2mg % Oct 2014  #COPD  chronic respiratory failure - O2 dependent since 2004 (CT 2010 - emphysema, CXR - per hx clear in 2013)  -- Gold stage 3-4, fev1 0.8L/34%, Ratio 40, TLC 110%, DLCO 35% In May 2013  - Normal ECHO - May 2006 - Rx Mdds include advair, spiriva, daily prednisone 10mg  and duoneb  -> reduced to 4mg  pred 09/09/2013 - REhab since oct 2012  - Fall 2013: Not a transplant candidate - [facility] transplant team screening  #Recurent AECOPD  - multiple in early 2013 - rx by PMD  - 03/01/2012 office visit - levaquin alone, no pred - Start N. acetylcysteine April 2014   #Ex-smoker  - 35 ppd. Quit 2007   #Baseline head tremor and walks with walkerr.   # Oct/Nov 2010  had s/p right nephrectomy for renal cell cancer discovered following MRI for leg/back pain.   #Also, s/p laminectomy following that in 2011 for "pinched nerve" by Dr Rodell Perna.  # Presumed stage 1A NSCLC - RUL nodule  -   In July 2013, CT showed 2.5cm spiculared RUL nodule. d PET scan 10/18/11 where Spiculated hypermetabolic right upper lobe lung nodule. This  measures 2.5 x 1.5 cm and a S.U.V. max of 6.1 on image 77.  - ENB 11/29/11: NON Diagnostic - normal resp cells - s.p empriic curative XRT ending Nov 2013 - Dec 2013: RUL mass improved 1.6cm - March 2014 : RUL mass improved 1.4cm - March 2016 - RUL nodule - down to 1.2cm  #left lung nodules  - July 2013: LUL/LLL 1.5cm and LL 6-24mm - March 2014: :LUL/LLL 1.5cm and no change LLL 45mm and slightly worse - Feb 2015 - LUL. LLL 1.5cm and no change. LLL 22mm and slightly worse -> XRT ending April 2015 - June 2015:  LUL/LLL - ? Same, LLL 76mm and better - March 2016:  stable  ///////////////////////////////////////////////////////////////////////////////////////  OV 01/13/2015  Chief Complaint  Patient presents with  . Follow-up    Pt c/o of dyspnea with exertion. Pt is on O2/2L pulse. Pt denies cough/wheeze/CP/tightness.     Last visit 06/30/2014.  FU COPD with chronic respiratory failure- stable. Denies admission, ER visits, pred bursts,. DuoNeb with daily prednisone tablet. Since last visit she is doing DuoNeb. She gets this at [facility] [employer]. Cause of $55 for 3 months. She is wondering if she could get something cheaper. She is asking for samples. She's not interested in Spiriva or Advair. She spends most of the day watching TV and playing with a computer husband agrees with her that she has no new issues   Fu lung cancer- s/p xrt. In April 2015. Surveillance CT scan March 2016 shows stability.. Repeat surveillance CT chest October 2016 that I personally visualized shows stability but there is a new left lower lobe lung nodule that is of large size but suspects more inflammatory and repeat nature   Renal cancer.: She is being followed [facility] urology for the same. But she tells me that they've asked me to do the surveillance CT of the abdomen at the time she has a surveillance CT scan of the chest.  Her urologist is Dr. Remo Lipps  Dahlstedt. This CT in October 2016 did not show any evidence of recurrence   OV 07/09/2015  Chief Complaint  Patient presents with  . Follow-up    SOB worse with exertion about the same as last visit. Denies any wheezing, cp/tightness.    Follow-up COPD, lung cancer with lung nodules and also renal cancer surveillance, in the setting of chronic respiratory failure.   Last follow-up was in fall 2016. Since then she's been doing well. She has intentionally lost a lot of weight. She feels more energetic. She had CT scan in April 2017 nodules are stable. This of the chest. She is upcoming for a surveillance CT chest and renal  cancer  CT of the abdomen without contrast end of 2017. Urology has been during the surveillance scans for her kidney. There are no new issues. sHe is compliant with nebulizers.   OV 01/18/2016  Chief Complaint  Patient presents with  . Follow-up    Pt states breathing is good today. Pt c/o no congestion, tightness or SOB. Pt states no coughing. Pt breathing has not changed since last OV.       follow-up COPD, lung cancer with lung nodules and also renal cancer surveillance in the setting of chronic hypoxemic respiratory failure   last visit April 2017. She was supposed to have surveillance CT chest and CT abdomen in December 2017 and then return to see me. However this visit appointment was made prematurely. She is here for follow-up. She says that she is maintaining her weight loss and therefore she is more energetic. She has no new acute complaints. She continues to wear daily oxygen. She's had to have a CT chest which is supposed to be in December 2017. She is no acute complaints. She is up-to-date with her flu shot. Her husband also endorses no complaints on her behalf.  OV 07/17/2016  Chief Complaint  Patient presents with  . Follow-up    Pt states her breathing is unchanged since last OV. Pt c/o dry cough. Pt deneis chest congestion, CP/tightness, and f/c/s.     Follow-up advanced COPD with chronic hypoxemic respiratory failure in the setting of renal cancer surveillance and lung cancer with lung nodules  December 2017 she had CT scan of the chest and abdomen documented below. Because of concern of renal cancer and also lung cancer referrals were made. She tells me that urology has her on observation therapy. Review of the chart also shows that Dr. Julien Nordmann. A PET scan and shoulder lung nodules for of low uptake and has her on observation therapy. She tells me that overall she is functional at home. She does not as always. Over she does get around the house with oxygen and is able to  self-care. There is no new issues. She continues with oxygen, 4 mg daily prednisone and DuoNeb. Husband is here with her.  IMPRESSION:December 11 11 2017 CT chest and abdomen 1. Multifocal pulmonary nodules are again noted compatible with metastatic disease. Most of these are stable with the exception of a left lower lobe nodule which has increased in size in the interval. 2. Status post right nephrectomy without evidence for local tumor recurrence. 3. Multiple lesions of varying complexity are identified within the left kidney. Arising from the inferior pole of the left kidney is an intermediate attenuating lesion which may represent a hemorrhagic/proteinaceous cyst or solid mass. This is new when compared with 12/15/2014. More definitive assessment of this nodule may be obtained with a contrast enhanced MRI  or CT of the abdomen (renal mass protocol). 4. Aortic atherosclerosis.   Electronically Signed   By: Kerby Moors M.D.   On: 02/29/2016 10:34   OV 01/15/2017  Chief Complaint  Patient presents with  . Follow-up    CT scan 11/07/16 and a CT biopsy 12/06/16.  Pt states that she has been doing good. Denies any SOB, cough, or CP. DME: [facility] 24/7.   Follow-up advanced COPD with chronic hypoxemic respiratory failure in the setting of both renal cancer and lung cancer  Last seen in spring 2018. She returns for routine follow-up. In the interim she had a right lower lobe biopsy of her lung nodule and this showed metastatic renal cell cancerin the lung. She is now on immunotherapy for this. She received one dose. She is not reporting any deterioration in her respiratory symptoms are her baseline functional state which is on a wheelchair   OV 07/17/2017  Chief Complaint  Patient presents with  . Follow-up    Pt stes she has been doing good. Denies any complaints or concerns.    Follow-up advanced COPD with chronic hypoxemic respiratory failure in the setting of both renal  cancer and lung cancer. Baseline wheelchair  For transffers but not for ADLs and head-shake    Last seen in October 2018. Presents for routine follow-up.In the interim her husband who is here with her has lost weight due to lower GI bed but he is now recovered.Patient herself has had progressive renal hands and apparently her medication is being changed. From a COPD perspective she continues to be stable on oxygen, DuoNeb and daily prednisone. She does her ADLs but does not go out of the house and the husband uses wheelchair for transfers.  CAT COPD Symptom & Quality of Life Score (GSK trademark) 0 is no burden. 5 is highest burden 07/17/2017   Never Cough -> Cough all the time 1  No phlegm in chest -> Chest is full of phlegm 3  No chest tightness -> Chest feels very tight 0  No dyspnea for 1 flight stairs/hill -> Very dyspneic for 1 flight of stairs 5  No limitations for ADL at home -> Very limited with ADL at home 5  Confident leaving home -> Not at all confident leaving home 3  Sleep soundly -> Do not sleep soundly because of lung condition 0  Lots of Energy -> No energy at all 3  TOTAL Score (max 40)  20   IMPRESSION: 1. Enlarging left kidney lower pole mass, now 6.2 cm in diameter, with progression and propagation of the tumor thrombus in the left renal vein which currently distends the vein and extends almost to the level of the IVC. Appearance compatible with renal cell carcinoma. There is some borderline new left periaortic adenopathy at the level of the left kidney. 2. Stable appearance of perivascular tumor nodules in the left lower lobe and right upper lobe. 3. Other imaging findings of potential clinical significance: Aortic Atherosclerosis (ICD10-I70.0). Coronary atherosclerosis. Trace pericardial effusion. Stable left renal cysts of varying complexity. Stable 2 mm nonobstructive left kidney lower pole calculus. Prominent stool throughout the colon favors constipation.  Lower lumbar degenerative disc disease. 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   Electronically Signed   By: Van Clines M.D.   On: 06/27/2017 07:56   has a past medical history of Anemia, Anxiety, Arthritis, Cancer ([place]), Chronic fatigue (12/13/2016), CKD (chronic kidney disease), stage II, COPD (chronic obstructive  pulmonary disease) ([place]), Cough (08-02-2015), Diabetes mellitus, type 2 ([place]), DM (diabetes mellitus) ([place]) (01/27/2012), DVT (deep venous thrombosis), left, Dyslipidemia, Dysrhythmia, Esophageal reflux, GERD (gastroesophageal reflux disease) (01/27/2012), Glaucoma, Goals of care, counseling/discussion (12/13/2016), Hemoptysis (08-02-15), History of radiation therapy (01/09/12,01/11/12,01/15/12,01/17/12,& 01/19/12), HTN (hypertension) (01/27/2012), Hyperlipidemia (01/27/2012), Hypertension, Leukocytosis, Lung cancer ([place]), Osteopenia, Renal cell carcinoma of right kidney ([place]) (12/13/2016), Shortness of breath, Syncope, Tremor, Urinary incontinence, and Vitamin D deficiency.   reports that she quit smoking about 13 years ago. Her smoking use included cigarettes. She has a 35.00 pack-year smoking history. She has never used smokeless tobacco.  Past Surgical History:  Procedure Laterality Date  . BACK SURGERY     for pinched nerve, 2011, here at [facility]  . EYE SURGERY     cataracts removed, ?IOL  . FLEXIBLE BRONCHOSCOPY  11/29/2011  . IR FLUORO GUIDE PORT INSERTION RIGHT  01/30/2017  . IR US GUIDE VASC ACCESS RIGHT  01/30/2017  . removed blood clot     left upper abd quad   . right kidney removed    . right nephrectomy      No Known Allergies  Immunization History  Administered Date(s) Administered  . Influenza Split 11/28/2011, 12/18/2012, 12/22/2013, 12/19/2015  . Influenza Whole 11/19/2010  . Influenza-Unspecified 12/14/2014, 11/22/2016  . Pneumococcal Conjugate-13 05/14/2013  . Pneumococcal Polysaccharide-23 02/18/2004  . Td  02/24/2014    Family History  Problem Relation Age of Onset  . Emphysema Father   . Cancer Sister        behind eye  . Cancer Brother        lung  . Cancer Brother        lung  . Cancer Brother        throat  . Breast cancer Neg Hx      Current Outpatient Medications:  .  aspirin 81 MG tablet, Take 81 mg by mouth daily., Disp: , Rfl:  .  bimatoprost (LUMIGAN) 0.01 % SOLN, Place 1 drop into both eyes at bedtime. , Disp: , Rfl:  .  brinzolamide (AZOPT) 1 % ophthalmic suspension, Place 1 drop into both eyes 3 (three) times daily. Reported on 08/12/2015, Disp: , Rfl:  .  cabozantinib (CABOMETYX) 40 MG tablet, Take 1 tablet (40 mg total) by mouth daily. Take on an empty stomach, 1 hour before or 2 hours after meals., Disp: 30 tablet, Rfl: 1 .  CALCIUM PO, Take by mouth daily., Disp: , Rfl:  .  clonazePAM (KLONOPIN) 1 MG tablet, Take 1 mg by mouth 2 (two) times daily as needed. For anxiety, Disp: , Rfl:  .  diltiazem (CARDIZEM CD) 180 MG 24 hr capsule, Take 180 mg by mouth daily., Disp: , Rfl: 0 .  ergocalciferol (VITAMIN D2) 50000 UNITS capsule, Take 50,000 Units by mouth once a week. Take on Monday, Disp: , Rfl:  .  esomeprazole (NEXIUM) 40 MG capsule, Take 40 mg by mouth daily before breakfast. , Disp: , Rfl:  .  furosemide (LASIX) 20 MG tablet, Take 20 mg by mouth daily as needed. For fluid retention, Disp: , Rfl:  .  gabapentin (NEURONTIN) 100 MG capsule, Take 300 mg by mouth daily., Disp: , Rfl: 0 .  ipratropium-albuterol (DUONEB) 0.5-2.5 (3) MG/3ML SOLN, Take 3 mLs by nebulization 4 (four) times daily as needed., Disp: 360 mL, Rfl: 4 .  JANUVIA 50 MG tablet, Take 50 mg by mouth daily. , Disp: , Rfl:  .  lidocaine-prilocaine (EMLA) cream, Apply 1 application topically as needed.,  Disp: , Rfl:  .  lovastatin (MEVACOR) 20 MG tablet, Take 20 mg by mouth at bedtime., Disp: , Rfl:  .  OXYGEN, Inhale 2 L into the lungs continuous., Disp: , Rfl:  .  predniSONE (DELTASONE) 5 MG tablet, Take  4 mg by mouth daily with breakfast. , Disp: , Rfl:  .  primidone (MYSOLINE) 50 MG tablet, Take 100 mg by mouth 2 (two) times daily. , Disp: , Rfl:  .  promethazine (PHENERGAN) 25 MG tablet, take 1 tablet by mouth every 6 hours if needed for nausea, Disp: , Rfl: 0 .  methocarbamol (ROBAXIN) 750 MG tablet, Take 750 mg by mouth 2 (two) times daily as needed., Disp: , Rfl: 0   Review of Systems     Objective:   Physical Exam Vitals:   07/17/17 1223  BP: 122/72  Pulse: 97  SpO2: 97%  Weight: 152 lb 6.4 oz (69.1 kg)  Height: 5\' 5"  (1.651 m)    Estimated body mass index is 25.36 kg/m as calculated from the following:   Height as of this encounter: 5\' 5"  (1.651 m).   Weight as of this encounter: 152 lb 6.4 oz (69.1 kg).    General Appearance:    Looks chronically unwell with a head check but stable  Head:    Normocephalic, without obvious abnormality, atraumatic  Eyes:    PERRL - yes, conjunctiva/corneas - clear      Ears:    Normal external ear canals, both ears  Nose:   NG tube - no but has [place] o2  Throat:  ETT TUBE - no , OG tube - no  Neck:   Supple,  No enlargement/tenderness/nodules     Lungs:     Clear to auscultation bilaterally,   Chest wall:    No deformity  Heart:    S1 and S2 normal, no murmur, CVP - no.  Pressors - no  Abdomen:     Soft, no masses, no organomegaly  Genitalia:    Not done  Rectal:   not done  Extremities:   Extremities- intact     Skin:   Intact in exposed areas .     Neurologic:   Sedation - none -> RASS - na . Moves all 4s - yes. CAM-ICU - neg . Orientation - x3 + Sits on wheel chair. Has head nod        Assessment:       ICD-10-CM   1. COPD, very severe ([place]) J44.9   2. Chronic hypoxemic respiratory failure ([facility]) J96.11         Plan:      Stable copd  Plan continue o2, duoneb and prednisone as before  Followup 6 months or sooner if needed   Dr. Brand Males, M.D., [facility].C.P Pulmonary and Critical Care Medicine Staff  Physician, [place] Director - Interstitial Lung Disease  Program  Pulmonary [place] [facility] [place], [place], [address]  Pager: [phone], If no answer or between  15:00h - 7:00h: call [phone] Telephone: [phone]

## 2017-07-25 ENCOUNTER — Telehealth: Payer: Self-pay | Admitting: Oncology

## 2017-07-25 ENCOUNTER — Inpatient Hospital Stay: Payer: Medicare Other | Attending: Internal Medicine | Admitting: Oncology

## 2017-07-25 ENCOUNTER — Inpatient Hospital Stay: Payer: Medicare Other

## 2017-07-25 ENCOUNTER — Encounter: Payer: Self-pay | Admitting: Oncology

## 2017-07-25 VITALS — BP 108/79 | HR 111 | Temp 98.2°F | Resp 18 | Ht 65.0 in | Wt 153.8 lb

## 2017-07-25 DIAGNOSIS — C641 Malignant neoplasm of right kidney, except renal pelvis: Secondary | ICD-10-CM | POA: Insufficient documentation

## 2017-07-25 DIAGNOSIS — C7801 Secondary malignant neoplasm of right lung: Secondary | ICD-10-CM | POA: Diagnosis not present

## 2017-07-25 DIAGNOSIS — Z9981 Dependence on supplemental oxygen: Secondary | ICD-10-CM | POA: Diagnosis not present

## 2017-07-25 DIAGNOSIS — R0602 Shortness of breath: Secondary | ICD-10-CM

## 2017-07-25 DIAGNOSIS — R5382 Chronic fatigue, unspecified: Secondary | ICD-10-CM

## 2017-07-25 DIAGNOSIS — Z5111 Encounter for antineoplastic chemotherapy: Secondary | ICD-10-CM

## 2017-07-25 DIAGNOSIS — Z79899 Other long term (current) drug therapy: Secondary | ICD-10-CM | POA: Insufficient documentation

## 2017-07-25 LAB — CBC WITH DIFFERENTIAL/PLATELET
Basophils Absolute: 0 10*3/uL (ref 0.0–0.1)
Basophils Relative: 0 %
EOS ABS: 0.4 10*3/uL (ref 0.0–0.5)
EOS PCT: 3 %
HCT: 35.9 % (ref 34.8–46.6)
Hemoglobin: 11.5 g/dL — ABNORMAL LOW (ref 11.6–15.9)
LYMPHS ABS: 2.2 10*3/uL (ref 0.9–3.3)
Lymphocytes Relative: 21 %
MCH: 31.4 pg (ref 25.1–34.0)
MCHC: 32.1 g/dL (ref 31.5–36.0)
MCV: 97.8 fL (ref 79.5–101.0)
MONOS PCT: 3 %
Monocytes Absolute: 0.3 10*3/uL (ref 0.1–0.9)
Neutro Abs: 7.8 10*3/uL — ABNORMAL HIGH (ref 1.5–6.5)
Neutrophils Relative %: 73 %
PLATELETS: 221 10*3/uL (ref 145–400)
RBC: 3.68 MIL/uL — ABNORMAL LOW (ref 3.70–5.45)
RDW: 16 % — ABNORMAL HIGH (ref 11.2–14.5)
WBC: 10.7 10*3/uL — ABNORMAL HIGH (ref 3.9–10.3)

## 2017-07-25 LAB — TSH: TSH: 1.13 u[IU]/mL (ref 0.308–3.960)

## 2017-07-25 LAB — COMPREHENSIVE METABOLIC PANEL
ALBUMIN: 3.6 g/dL (ref 3.5–5.0)
ALT: 38 U/L (ref 0–55)
AST: 21 U/L (ref 5–34)
Alkaline Phosphatase: 100 U/L (ref 40–150)
Anion gap: 7 (ref 3–11)
BUN: 13 mg/dL (ref 7–26)
CHLORIDE: 105 mmol/L (ref 98–109)
CO2: 27 mmol/L (ref 22–29)
CREATININE: 1.05 mg/dL (ref 0.60–1.10)
Calcium: 9.2 mg/dL (ref 8.4–10.4)
GFR calc Af Amer: >60 mL/min (ref >60)
GFR calc non Af Amer: 53 mL/min — ABNORMAL LOW (ref >60)
GLUCOSE: 128 mg/dL (ref 70–140)
Potassium: 3.8 mmol/L (ref 3.5–5.1)
SODIUM: 139 mmol/L (ref 136–145)
Total Bilirubin: <0.2 mg/dL — ABNORMAL LOW (ref 0.2–1.2)
Total Protein: 6.9 g/dL (ref 6.4–8.3)

## 2017-07-25 NOTE — Assessment & Plan Note (Addendum)
This is a very pleasant36year old African-American female with metastatic renal cell carcinoma that was initially diagnosed in 2010 and now presenting with bilateral pulmonary nodules. She was treated with stereotactic radiotherapy to hypermetabolic nodules without tissue diagnosis in the past. Imagingstudies showed hypermetabolic and enlarging bilateral pulmonary nodules. CT-guided core biopsy of right pulmonary nodule was consistent with metastatic renal cell carcinoma. The patient isstartedtreatment with immunotherapy with ipilimumab and Nivolumab status post3cycles. Shetoleratedthis treatment well except for severe itchingand treatment was placed on hold. Itching improved withprednisone. She is now off all steroids. She is now on single agent Nivolumab 480 mg IV every 4 weeks. Status post 2 cycles.   She tolerated her treatment fairly well. Treatment was discontinued due to disease progression. The patient is now on Cabometyx 40 mg daily.  Status post 3 weeks of treatment which she is tolerating fairly well with no concerning complaints.  The patient was seen Dr. Julien Nordmann.  The patient is tolerating her treatment well.  Labs been reviewed and are stable.  Recommend that she continue Cabometyx 40 mg daily.  She will follow-up in approximately 2 weeks for evaluation and repeat lab work.   The patient was advised to call immediately if she has any other concerning symptoms in the interval. The patient voices understanding of current disease status and treatment options and is in agreement with the current care plan. All questions were answered. The patient knows to call the clinic with any problems, questions or concerns. We can certainly see the patient much sooner if necessary.

## 2017-07-25 NOTE — Progress Notes (Signed)
[place] OFFICE PROGRESS NOTE  Melanie Low, MD [place] [address] [place]  [address]  DIAGNOSIS:  ) Metastatic renal cell carcinoma, clear cell type, WHO nuclear grade 3 initially diagnosed in September 2010 and recently presented with bilateral pulmonary nodules. 2) history of renal cell carcinoma status post right nephrectomy in September 2010.  PRIOR THERAPY: 1)Stereotactic radiotherapy to pulmonary nodules based on the metabolic activity with no tissue diagnosis. This was performed under the care of Dr. Pablo Ledger. 2)first line treatment with immunotherapy with Nivolumab 3 MG/KG and Ipilumumab 1 mg/Kg every 3 weeks for 4 cycles followed by maintenance Nivolumab every 3 weeks. First dose 12/27/2016. Status post 3cycles.Stopped due to severe itching. 3) Nivolumab480 mg IV every 4 weeksas a single agentstartingon 05/03/2017.Status post 2 cycles.  Discontinued secondary to disease progression.  CURRENT THERAPY: Cabometyx 40 mg daily.  First dose started on 07/04/2017.  INTERVAL HISTORY: Melanie Best 72 y.o. female returns for routine follow-up visit accompanied by her husband.  The patient is feeling fine today and has no specific complaints.  She is tolerating her Cabometyx fairly well.  The patient denies fevers and chills.  She has her baseline shortness of breath and wears home oxygen.  Denies chest pain, cough, hemoptysis.  Denies nausea, vomiting, constipation, diarrhea.  Denies recent weight loss or night sweats.  The patient is here for evaluation and repeat lab work.  MEDICAL HISTORY: Past Medical History:  Diagnosis Date  . Anemia   . Anxiety   . Arthritis    R leg  . Cancer ([facility])    kidney  . Chronic fatigue 12/13/2016  . CKD (chronic kidney disease), stage II    removed R kidney- 2010- for Cancer, followed by Dr. Diona Fanti  . COPD (chronic obstructive pulmonary disease) (H[facility])    uses O2-2 liters , continuously , followed by Dr.  Chase Caller  . Cough 08-02-2015  . Diabetes mellitus, type 2 ([facility])   . DM (diabetes mellitus) ([facility]) 01/27/2012  . DVT (deep venous thrombosis), left    2011- treated /w coumadin   . Dyslipidemia   . Dysrhythmia   . Esophageal reflux   . GERD (gastroesophageal reflux disease) 01/27/2012  . Glaucoma   . Goals of care, counseling/discussion 12/13/2016  . Hemoptysis 08-02-15  . History of radiation therapy 01/09/12,01/11/12,01/15/12,01/17/12,& 01/19/12   rul lung,50Gy/72fx  . HTN (hypertension) 01/27/2012  . Hyperlipidemia 01/27/2012  . Hypertension    sees Dr. Deforest Hoyles for PCP, denies ever having stress or ECHO, ?when & where she had  last  EKG  . Leukocytosis   . Lung cancer ([facility])    NSCLC  . Osteopenia   . Renal cell carcinoma of right kidney ([facility]) 12/13/2016  . Shortness of breath   . Syncope   . Tremor   . Urinary incontinence   . Vitamin D deficiency     ALLERGIES:  has No Known Allergies.  MEDICATIONS:  Current Outpatient Medications  Medication Sig Dispense Refill  . aspirin 81 MG tablet Take 81 mg by mouth daily.    . bimatoprost (LUMIGAN) 0.01 % SOLN Place 1 drop into both eyes at bedtime.     . brinzolamide (AZOPT) 1 % ophthalmic suspension Place 1 drop into both eyes 3 (three) times daily. Reported on 08/12/2015    . cabozantinib (CABOMETYX) 40 MG tablet Take 1 tablet (40 mg total) by mouth daily. Take on an empty stomach, 1 hour before or 2 hours after meals. 30 tablet 1  .  CALCIUM PO Take by mouth daily.    . clonazePAM (KLONOPIN) 1 MG tablet Take 1 mg by mouth 2 (two) times daily as needed. For anxiety    . diltiazem (CARDIZEM CD) 180 MG 24 hr capsule Take 180 mg by mouth daily.  0  . ergocalciferol (VITAMIN D2) 50000 UNITS capsule Take 50,000 Units by mouth once a week. Take on Monday    . esomeprazole (NEXIUM) 40 MG capsule Take 40 mg by mouth daily before breakfast.     . furosemide (LASIX) 20 MG tablet Take 20 mg by mouth daily as needed. For fluid retention    .  gabapentin (NEURONTIN) 100 MG capsule Take 300 mg by mouth daily.  0  . ipratropium-albuterol (DUONEB) 0.5-2.5 (3) MG/3ML SOLN Take 3 mLs by nebulization 4 (four) times daily as needed. 360 mL 4  . JANUVIA 50 MG tablet Take 50 mg by mouth daily.     Marland Kitchen lidocaine-prilocaine (EMLA) cream Apply 1 application topically as needed.    . lovastatin (MEVACOR) 20 MG tablet Take 20 mg by mouth at bedtime.    . methocarbamol (ROBAXIN) 750 MG tablet Take 750 mg by mouth 2 (two) times daily as needed.  0  . OXYGEN Inhale 2 L into the lungs continuous.    . predniSONE (DELTASONE) 5 MG tablet Take 4 mg by mouth daily with breakfast.     . primidone (MYSOLINE) 50 MG tablet Take 100 mg by mouth 2 (two) times daily.     . promethazine (PHENERGAN) 25 MG tablet take 1 tablet by mouth every 6 hours if needed for nausea  0   No current facility-administered medications for this visit.     SURGICAL HISTORY:  Past Surgical History:  Procedure Laterality Date  . BACK SURGERY     for pinched nerve, 2011, here at [facility]  . EYE SURGERY     cataracts removed, ?IOL  . FLEXIBLE BRONCHOSCOPY  11/29/2011  . IR FLUORO GUIDE PORT INSERTION RIGHT  01/30/2017  . IR US GUIDE VASC ACCESS RIGHT  01/30/2017  . removed blood clot     left upper abd quad   . right kidney removed    . right nephrectomy      REVIEW OF SYSTEMS:   Review of Systems  Constitutional: Negative for appetite change, chills, fatigue, fever and unexpected weight change.  HENT:   Negative for mouth sores, nosebleeds, sore throat and trouble swallowing.   Eyes: Negative for eye problems and icterus.  Respiratory: Negative for cough, hemoptysis, and wheezing.  She has her baseline shortness of breath and wears home oxygen.  Cardiovascular: Negative for chest pain and leg swelling.  Gastrointestinal: Negative for abdominal pain, constipation, diarrhea, nausea and vomiting.  Genitourinary: Negative for bladder incontinence, difficulty urinating, dysuria,  frequency and hematuria.   Musculoskeletal: Negative for back pain, neck pain and neck stiffness.  Skin: Negative for itching and rash.  Neurological: Negative for dizziness, extremity weakness, headaches, light-headedness and seizures.  Hematological: Negative for adenopathy. Does not bruise/bleed easily.  Psychiatric/Behavioral: Negative for confusion, depression and sleep disturbance. The patient is not nervous/anxious.     PHYSICAL EXAMINATION:  Blood pressure 108/79, pulse (!) 111, temperature 98.2 F (36.8 C), temperature source Oral, resp. rate 18, height 5\' 5"  (1.651 m), weight 153 lb 12.8 oz (69.8 kg), SpO2 98 %.  ECOG PERFORMANCE STATUS: 1 - Symptomatic but completely ambulatory  Physical Exam  Constitutional: Oriented to person, place, and time and well-developed, well-nourished, and in no  distress. No distress.  HENT:  Head: Normocephalic and atraumatic.  Mouth/Throat: Oropharynx is clear and moist. No oropharyngeal exudate.  Eyes: Conjunctivae are normal. Right eye exhibits no discharge. Left eye exhibits no discharge. No scleral icterus.  Neck: Normal range of motion. Neck supple.  Cardiovascular: Normal rate, regular rhythm, normal heart sounds and intact distal pulses.   Pulmonary/Chest: Effort normal and breath sounds normal. No respiratory distress. No wheezes. No rales.  Abdominal: Soft. Bowel sounds are normal. Exhibits no distension and no mass. There is no tenderness.  Musculoskeletal: Normal range of motion. Exhibits no edema.  Lymphadenopathy:    No cervical adenopathy.  Neurological: Alert and oriented to person, place, and time. Exhibits normal muscle tone. Coordination normal.  Skin: Skin is warm and dry. No rash noted. Not diaphoretic. No erythema. No pallor.  Psychiatric: Mood, memory and judgment normal.  Vitals reviewed.  LABORATORY DATA: Lab Results  Component Value Date   WBC 10.7 (H) 07/25/2017   HGB 11.5 (L) 07/25/2017   HCT 35.9 07/25/2017    MCV 97.8 07/25/2017   PLT 221 07/25/2017      Chemistry      Component Value Date/Time   NA 139 07/25/2017 1419   NA 141 03/08/2017 1258   K 3.8 07/25/2017 1419   K 3.7 03/08/2017 1258   CL 105 07/25/2017 1419   CO2 27 07/25/2017 1419   CO2 23 03/08/2017 1258   BUN 13 07/25/2017 1419   BUN 24.1 03/08/2017 1258   CREATININE 1.05 07/25/2017 1419   CREATININE 1.4 (H) 03/08/2017 1258      Component Value Date/Time   CALCIUM 9.2 07/25/2017 1419   CALCIUM 8.8 03/08/2017 1258   ALKPHOS 100 07/25/2017 1419   ALKPHOS 65 03/08/2017 1258   AST 21 07/25/2017 1419   AST 8 03/08/2017 1258   ALT 38 07/25/2017 1419   ALT 20 03/08/2017 1258   BILITOT <0.2 (L) 07/25/2017 1419   BILITOT 0.40 03/08/2017 1258       RADIOGRAPHIC STUDIES:  Ct Abdomen Pelvis W Wo Contrast  Result Date: 06/27/2017 CLINICAL DATA:  Restaging of left kidney renal cell carcinoma EXAM: CT CHEST WITH CONTRAST CT ABDOMEN AND PELVIS WITH AND WITHOUT CONTRAST TECHNIQUE: Multidetector CT imaging of the chest was performed during intravenous contrast administration. Multidetector CT imaging of the abdomen and pelvis was performed following the standard protocol before and during bolus administration of intravenous contrast. CONTRAST:  ISOVUE-300 IOPAMIDOL (ISOVUE-300) INJECTION 61%, 149mL OMNIPAQUE IOHEXOL 300 MG/ML SOLN COMPARISON:  Multiple exams, including 05/03/2016 and 04/12/2016 FINDINGS: CT CHEST FINDINGS Cardiovascular: Coronary, aortic arch, and branch vessel atherosclerotic vascular disease. Trace pericardial effusion, mildly increased from prior. Right Port-A-Cath tip: Lower SVC. Peribronchovascular tumor in the left lower lobe exert some local mass effect on left lower lobe pulmonary arterial branches for example on image 65/11. Mediastinum/Nodes: No pathologic adenopathy. Lungs/Pleura: Tumor tracking along margins of the left lower lobe pulmonary artery appears essentially stable, with a nodular component measuring  1.7 by 1.5 cm on image 64/11 and previously 1.8 by 1.4 cm. Similarly there is some Best-grade peribronchovascular nodularity in the right suprahilar region which is difficult to measure, along with some chronic atelectasis in the right upper lobe. Posterior basal segment right lower lobe nodule on image 135/15 measures 3 mm in diameter, previously 2 mm. Small subpleural nodules in the apicoposterior segment left upper lobe are stable. Musculoskeletal: Unremarkable CT ABDOMEN AND PELVIS FINDINGS Hepatobiliary: Unremarkable Pancreas: Unremarkable Spleen: Unremarkable Adrenals/Urinary Tract: Prior right nephrectomy. Adrenal  glands unremarkable. Heterogeneously enhancing left kidney lower pole mass measuring 6.2 by 6.0 cm on image 59/6 (formerly 5.6 by 4.8 cm). There has been propagation of tumor thrombus now distending the left renal vein and extending almost to the IVC. Collateralization and distention of the left ovarian vein due to the renal vein occlusion. Numerous other cysts of varying complexity are present in the left kidney. There is a 2 mm left kidney lower pole nonobstructive renal calculus on image 49/3. A 3 mm calcification near the left proximal ureter on image 129/11 is thought to be vascular rather than inside the ureter. Stomach/Bowel: Prominent stool throughout the colon favors constipation. Vascular/Lymphatic: Aortoiliac atherosclerotic vascular disease. Indistinctly marginated left periaortic lymph node 0.9 cm in short axis on image 114/11, previously 0.4 cm. Reproductive: Retroverted uterus.  Adnexa unremarkable. Other: No supplemental non-categorized findings. Musculoskeletal: There is mild grade 1 degenerative anterolisthesis at L4-5 along with degenerative disc disease at L4-5 and L5-S1. Mild chronic avascular necrosis along a small portion of the left femoral head on image 47/12, without flattening or collapse. IMPRESSION: 1. Enlarging left kidney lower pole mass, now 6.2 cm in diameter, with  progression and propagation of the tumor thrombus in the left renal vein which currently distends the vein and extends almost to the level of the IVC. Appearance compatible with renal cell carcinoma. There is some borderline new left periaortic adenopathy at the level of the left kidney. 2. Stable appearance of perivascular tumor nodules in the left lower lobe and right upper lobe. 3. Other imaging findings of potential clinical significance: Aortic Atherosclerosis (ICD10-I70.0). Coronary atherosclerosis. Trace pericardial effusion. Stable left renal cysts of varying complexity. Stable 2 mm nonobstructive left kidney lower pole calculus. Prominent stool throughout the colon favors constipation. Lower lumbar degenerative disc disease. 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 Electronically Signed   By: Van Clines M.D.   On: 06/27/2017 07:56   Ct Chest W Contrast  Result Date: 06/27/2017 CLINICAL DATA:  Restaging of left kidney renal cell carcinoma EXAM: CT CHEST WITH CONTRAST CT ABDOMEN AND PELVIS WITH AND WITHOUT CONTRAST TECHNIQUE: Multidetector CT imaging of the chest was performed during intravenous contrast administration. Multidetector CT imaging of the abdomen and pelvis was performed following the standard protocol before and during bolus administration of intravenous contrast. CONTRAST:  ISOVUE-300 IOPAMIDOL (ISOVUE-300) INJECTION 61%, 164mL OMNIPAQUE IOHEXOL 300 MG/ML SOLN COMPARISON:  Multiple exams, including 05/03/2016 and 04/12/2016 FINDINGS: CT CHEST FINDINGS Cardiovascular: Coronary, aortic arch, and branch vessel atherosclerotic vascular disease. Trace pericardial effusion, mildly increased from prior. Right Port-A-Cath tip: Lower SVC. Peribronchovascular tumor in the left lower lobe exert some local mass effect on left lower lobe pulmonary arterial branches for example on image 65/11. Mediastinum/Nodes: No pathologic adenopathy. Lungs/Pleura: Tumor  tracking along margins of the left lower lobe pulmonary artery appears essentially stable, with a nodular component measuring 1.7 by 1.5 cm on image 64/11 and previously 1.8 by 1.4 cm. Similarly there is some Best-grade peribronchovascular nodularity in the right suprahilar region which is difficult to measure, along with some chronic atelectasis in the right upper lobe. Posterior basal segment right lower lobe nodule on image 135/15 measures 3 mm in diameter, previously 2 mm. Small subpleural nodules in the apicoposterior segment left upper lobe are stable. Musculoskeletal: Unremarkable CT ABDOMEN AND PELVIS FINDINGS Hepatobiliary: Unremarkable Pancreas: Unremarkable Spleen: Unremarkable Adrenals/Urinary Tract: Prior right nephrectomy. Adrenal glands unremarkable. Heterogeneously enhancing left kidney lower pole mass measuring 6.2 by 6.0 cm on image  59/6 (formerly 5.6 by 4.8 cm). There has been propagation of tumor thrombus now distending the left renal vein and extending almost to the IVC. Collateralization and distention of the left ovarian vein due to the renal vein occlusion. Numerous other cysts of varying complexity are present in the left kidney. There is a 2 mm left kidney lower pole nonobstructive renal calculus on image 49/3. A 3 mm calcification near the left proximal ureter on image 129/11 is thought to be vascular rather than inside the ureter. Stomach/Bowel: Prominent stool throughout the colon favors constipation. Vascular/Lymphatic: Aortoiliac atherosclerotic vascular disease. Indistinctly marginated left periaortic lymph node 0.9 cm in short axis on image 114/11, previously 0.4 cm. Reproductive: Retroverted uterus.  Adnexa unremarkable. Other: No supplemental non-categorized findings. Musculoskeletal: There is mild grade 1 degenerative anterolisthesis at L4-5 along with degenerative disc disease at L4-5 and L5-S1. Mild chronic avascular necrosis along a small portion of the left femoral head on  image 47/12, without flattening or collapse. IMPRESSION: 1. Enlarging left kidney lower pole mass, now 6.2 cm in diameter, with progression and propagation of the tumor thrombus in the left renal vein which currently distends the vein and extends almost to the level of the IVC. Appearance compatible with renal cell carcinoma. There is some borderline new left periaortic adenopathy at the level of the left kidney. 2. Stable appearance of perivascular tumor nodules in the left lower lobe and right upper lobe. 3. Other imaging findings of potential clinical significance: Aortic Atherosclerosis (ICD10-I70.0). Coronary atherosclerosis. Trace pericardial effusion. Stable left renal cysts of varying complexity. Stable 2 mm nonobstructive left kidney lower pole calculus. Prominent stool throughout the colon favors constipation. Lower lumbar degenerative disc disease. 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 Electronically Signed   By: Van Clines M.D.   On: 06/27/2017 07:56     ASSESSMENT/PLAN:  Renal cell carcinoma of right kidney [facility]) This is a very pleasant26year old African-American female with metastatic renal cell carcinoma that was initially diagnosed in 2010 and now presenting with bilateral pulmonary nodules. She was treated with stereotactic radiotherapy to hypermetabolic nodules without tissue diagnosis in the past. Imagingstudies showed hypermetabolic and enlarging bilateral pulmonary nodules. CT-guided core biopsy of right pulmonary nodule was consistent with metastatic renal cell carcinoma. The patient isstartedtreatment with immunotherapy with ipilimumab and Nivolumab status post3cycles. Shetoleratedthis treatment well except for severe itchingand treatment was placed on hold. Itching improved withprednisone. She is now off all steroids. She is now on single agent Nivolumab 480 mg IV every 4 weeks. Status post 2 cycles.   She  tolerated her  treatment fairly well. Treatment was discontinued due to disease progression. The patient is now on Cabometyx 40 mg daily.  Status post 3 weeks of treatment which she is tolerating fairly well with no concerning complaints.  The patient was seen Dr. Julien Nordmann.  The patient is tolerating her treatment well.  Labs been reviewed and are stable.  Recommend that she continue Cabometyx 40 mg daily.  She will follow-up in approximately 2 weeks for evaluation and repeat lab work.   The patient was advised to call immediately if she has any other concerning symptoms in the interval. The patient voices understanding of current disease status and treatment options and is in agreement with the current care plan. All questions were answered. The patient knows to call the clinic with any problems, questions or concerns. We can certainly see the patient much sooner if necessary.   Orders Placed This Encounter  Procedures  . CBC with Differential (Cancer Center Only)    Standing Status:   Future    Standing Expiration Date:   07/26/2018  . CMP ([place] only)    Standing Status:   Future    Standing Expiration Date:   07/26/2018   Mikey Bussing, DNP, AGPCNP-BC, AOCNP 07/25/17  ADDENDUM: Hematology/Oncology Attending: I had a face-to-face encounter with the patient today.  I recommended her care plan.  This is a very pleasant 72 years old African-American female with metastatic renal cell carcinoma treated initially with a course of immunotherapy with ipilimumab and Nivolumab complicated with significant pruritus and ipilimumab was discontinued.  She proceeded with 2 more cycles of nivolumab but unfortunately she has evidence for disease progression.  The patient is currently undergoing treatment with second line Cabometyx.  She is status post 3 weeks of treatment and has been tolerating it fairly well.  She denied having any significant nausea or vomiting.  She has no weight loss or night sweats. I  recommended for the patient to continue her current treatment with Cabometyx. We will see her back for follow-up visit in 2 weeks for reevaluation and repeat blood work for close monitoring of her condition. The patient was advised to call immediately if she has any concerning symptoms in the interval.  Disclaimer: This note was dictated with voice recognition software. Similar sounding words can inadvertently be transcribed and may be missed upon review. Eilleen Kempf, MD 07/25/17

## 2017-07-25 NOTE — Telephone Encounter (Signed)
Scheduled appt per 5/8 los - Gave patient AVS and calender per los.

## 2017-07-26 ENCOUNTER — Other Ambulatory Visit: Payer: Medicare Other

## 2017-07-26 ENCOUNTER — Ambulatory Visit: Payer: Medicare Other | Admitting: Oncology

## 2017-07-26 ENCOUNTER — Ambulatory Visit: Payer: Medicare Other

## 2017-07-31 MED FILL — CABOMETYX 40 MG TABLET: 40 | 30 days supply | Qty: 30 | Fill #1

## 2017-08-15 ENCOUNTER — Inpatient Hospital Stay: Payer: Medicare Other

## 2017-08-15 ENCOUNTER — Inpatient Hospital Stay (HOSPITAL_BASED_OUTPATIENT_CLINIC_OR_DEPARTMENT_OTHER): Payer: Medicare Other | Admitting: Internal Medicine

## 2017-08-15 ENCOUNTER — Telehealth: Payer: Self-pay | Admitting: Internal Medicine

## 2017-08-15 ENCOUNTER — Encounter: Payer: Self-pay | Admitting: Internal Medicine

## 2017-08-15 VITALS — BP 113/67 | HR 95 | Temp 98.2°F | Resp 18 | Ht 65.0 in | Wt 152.2 lb

## 2017-08-15 DIAGNOSIS — C641 Malignant neoplasm of right kidney, except renal pelvis: Secondary | ICD-10-CM

## 2017-08-15 DIAGNOSIS — C7801 Secondary malignant neoplasm of right lung: Secondary | ICD-10-CM

## 2017-08-15 DIAGNOSIS — Z9981 Dependence on supplemental oxygen: Secondary | ICD-10-CM | POA: Diagnosis not present

## 2017-08-15 DIAGNOSIS — R5382 Chronic fatigue, unspecified: Secondary | ICD-10-CM

## 2017-08-15 DIAGNOSIS — R0602 Shortness of breath: Secondary | ICD-10-CM | POA: Diagnosis not present

## 2017-08-15 LAB — CBC WITH DIFFERENTIAL/PLATELET
Basophils Absolute: 0 10*3/uL (ref 0.0–0.1)
Basophils Relative: 0 %
Eosinophils Absolute: 0 10*3/uL (ref 0.0–0.5)
Eosinophils Relative: 0 %
HEMATOCRIT: 35.4 % (ref 34.8–46.6)
HEMOGLOBIN: 10.9 g/dL — AB (ref 11.6–15.9)
LYMPHS ABS: 2.7 10*3/uL (ref 0.9–3.3)
LYMPHS PCT: 24 %
MCH: 31.9 pg (ref 25.1–34.0)
MCHC: 30.8 g/dL — ABNORMAL LOW (ref 31.5–36.0)
MCV: 103.5 fL — AB (ref 79.5–101.0)
Monocytes Absolute: 0.3 10*3/uL (ref 0.1–0.9)
Monocytes Relative: 3 %
NEUTROS ABS: 8.2 10*3/uL — AB (ref 1.5–6.5)
Neutrophils Relative %: 73 %
PLATELETS: 225 10*3/uL (ref 145–400)
RBC: 3.42 MIL/uL — AB (ref 3.70–5.45)
RDW: 17.1 % — ABNORMAL HIGH (ref 11.2–14.5)
WBC: 11.2 10*3/uL — AB (ref 3.9–10.3)

## 2017-08-15 LAB — COMPREHENSIVE METABOLIC PANEL
ALBUMIN: 3.6 g/dL (ref 3.5–5.0)
ALT: 44 U/L (ref 0–55)
AST: 24 U/L (ref 5–34)
Alkaline Phosphatase: 88 U/L (ref 40–150)
Anion gap: 15 — ABNORMAL HIGH (ref 3–11)
BUN: 14 mg/dL (ref 7–26)
CHLORIDE: 101 mmol/L (ref 98–109)
CO2: 25 mmol/L (ref 22–29)
Calcium: 9.1 mg/dL (ref 8.4–10.4)
Creatinine, Ser: 1.06 mg/dL (ref 0.60–1.10)
GFR calc Af Amer: >60 mL/min (ref >60)
GFR calc non Af Amer: 52 mL/min — ABNORMAL LOW (ref >60)
Glucose, Bld: 173 mg/dL — ABNORMAL HIGH (ref 70–140)
POTASSIUM: 3.9 mmol/L (ref 3.5–5.1)
Sodium: 141 mmol/L (ref 136–145)
Total Bilirubin: <0.2 mg/dL — ABNORMAL LOW (ref 0.2–1.2)
Total Protein: 6.9 g/dL (ref 6.4–8.3)

## 2017-08-15 LAB — TSH: TSH: 0.556 u[IU]/mL (ref 0.308–3.960)

## 2017-08-15 NOTE — Telephone Encounter (Signed)
Scheduled appt per 5/29 los - Gave patient aVS and calender per los. Unable to schedule in exactly 3 weeks - scheduled next available.

## 2017-08-15 NOTE — Progress Notes (Signed)
[facility] [phone]   [phone]  OFFICE PROGRESS NOTE  [name], MD [address] [place] [address]  DIAGNOSIS:  1) Metastatic renal cell carcinoma, clear cell type, WHO nuclear grade 3 initially diagnosed in September 2010 and recently presented with bilateral pulmonary nodules. 2) history of renal cell carcinoma status post right nephrectomy in September 2010.  PRIOR THERAPY: 1)Stereotactic radiotherapy to pulmonary nodules based on the metabolic activity with no tissue diagnosis. This was performed under the care of Dr. Pablo Ledger. 2)first line treatment with immunotherapy with Nivolumab 3 MG/KG and Ipilumumab 1 mg/Kg every 3 weeks for 4 cycles followed by maintenance Nivolumab every 3 weeks. First dose 12/27/2016. Status post 3cycles.Stopped due to severe itching. 3) Nivolumab480 mg IV every 4 weeksas a single agentstartingon 05/03/2017.Status post2cycles.  Discontinued secondary to disease progression.  CURRENT THERAPY: Cabometyx 40 mg daily.  First dose started on 07/04/2017.  INTERVAL HISTORY: Melanie Best 72 y.o. female returns to the clinic today for follow-up visit accompanied by her husband.  The patient is feeling fine today with no specific complaints.  She denied having any chest pain but continues to have the baseline shortness of breath and she is currently on home oxygen.  She denied having any fever or chills.  She has no nausea, vomiting, diarrhea or constipation.  She denied having any weight loss or night sweats.  She continues to tolerate her treatment with Cabometyx fairly well.  She is here today for evaluation and repeat blood work.  MEDICAL HISTORY: Past Medical History:  Diagnosis Date  . Anemia   . Anxiety   . Arthritis    R leg  . Cancer ([facility])    kidney  . Chronic fatigue 12/13/2016  . CKD (chronic kidney disease), stage II    removed R kidney- 2010- for Cancer, followed  by Dr. Diona Fanti  . COPD (chronic obstructive pulmonary disease) ([facility])    uses O2-2 liters , continuously , followed by Dr. Chase Caller  . Cough 08-02-2015  . Diabetes mellitus, type 2 ([facility])   . DM (diabetes mellitus) ([facility]) 01/27/2012  . DVT (deep venous thrombosis), left    2011- treated /w coumadin   . Dyslipidemia   . Dysrhythmia   . Esophageal reflux   . GERD (gastroesophageal reflux disease) 01/27/2012  . Glaucoma   . Goals of care, counseling/discussion 12/13/2016  . Hemoptysis 08-02-15  . History of radiation therapy 01/09/12,01/11/12,01/15/12,01/17/12,& 01/19/12   rul lung,50Gy/34fx  . HTN (hypertension) 01/27/2012  . Hyperlipidemia 01/27/2012  . Hypertension    sees Dr. Deforest Hoyles for PCP, denies ever having stress or ECHO, ?when & where she had  last  EKG  . Leukocytosis   . Lung cancer ([facility])    NSCLC  . Osteopenia   . Renal cell carcinoma of right kidney ([facility]) 12/13/2016  . Shortness of breath   . Syncope   . Tremor   . Urinary incontinence   . Vitamin D deficiency     ALLERGIES:  has No Known Allergies.  MEDICATIONS:  Current Outpatient Medications  Medication Sig Dispense Refill  . aspirin 81 MG tablet Take 81 mg by mouth daily.    . bimatoprost (LUMIGAN) 0.01 % SOLN Place 1 drop into both eyes at bedtime.     . brinzolamide (AZOPT) 1 % ophthalmic suspension Place 1 drop into both eyes 3 (three) times daily. Reported on 08/12/2015    . cabozantinib (CABOMETYX) 40 MG tablet Take  1 tablet (40 mg total) by mouth daily. Take on an empty stomach, 1 hour before or 2 hours after meals. 30 tablet 1  . CALCIUM PO Take by mouth daily.    . clonazePAM (KLONOPIN) 1 MG tablet Take 1 mg by mouth 2 (two) times daily as needed. For anxiety    . diltiazem (CARDIZEM CD) 180 MG 24 hr capsule Take 180 mg by mouth daily.  0  . ergocalciferol (VITAMIN D2) 50000 UNITS capsule Take 50,000 Units by mouth once a week. Take on Monday    . esomeprazole (NEXIUM) 40 MG capsule Take 40 mg by mouth daily  before breakfast.     . furosemide (LASIX) 20 MG tablet Take 20 mg by mouth daily as needed. For fluid retention    . gabapentin (NEURONTIN) 100 MG capsule Take 300 mg by mouth daily.  0  . ipratropium-albuterol (DUONEB) 0.5-2.5 (3) MG/3ML SOLN Take 3 mLs by nebulization 4 (four) times daily as needed. 360 mL 4  . JANUVIA 50 MG tablet Take 50 mg by mouth daily.     Marland Kitchen lidocaine-prilocaine (EMLA) cream Apply 1 application topically as needed.    . lovastatin (MEVACOR) 20 MG tablet Take 20 mg by mouth at bedtime.    . methocarbamol (ROBAXIN) 750 MG tablet Take 750 mg by mouth 2 (two) times daily as needed.  0  . OXYGEN Inhale 2 L into the lungs continuous.    . predniSONE (DELTASONE) 5 MG tablet Take 4 mg by mouth daily with breakfast.     . primidone (MYSOLINE) 50 MG tablet Take 100 mg by mouth 2 (two) times daily.     . promethazine (PHENERGAN) 25 MG tablet take 1 tablet by mouth every 6 hours if needed for nausea  0   No current facility-administered medications for this visit.     SURGICAL HISTORY:  Past Surgical History:  Procedure Laterality Date  . BACK SURGERY     for pinched nerve, 2011, here at [facility]  . EYE SURGERY     cataracts removed, ?IOL  . FLEXIBLE BRONCHOSCOPY  11/29/2011  . IR FLUORO GUIDE PORT INSERTION RIGHT  01/30/2017  . IR US GUIDE VASC ACCESS RIGHT  01/30/2017  . removed blood clot     left upper abd quad   . right kidney removed    . right nephrectomy      REVIEW OF SYSTEMS:  A comprehensive review of systems was negative except for: Constitutional: positive for fatigue Respiratory: positive for dyspnea on exertion   PHYSICAL EXAMINATION: General appearance: alert, cooperative, fatigued and no distress Head: Normocephalic, without obvious abnormality, atraumatic Neck: no adenopathy, no JVD, supple, symmetrical, trachea midline and thyroid not enlarged, symmetric, no tenderness/mass/nodules Lymph nodes: Cervical, supraclavicular, and axillary nodes  normal. Resp: clear to auscultation bilaterally Back: symmetric, no curvature. ROM normal. No CVA tenderness. Cardio: regular rate and rhythm, S1, S2 normal, no murmur, click, rub or gallop GI: soft, non-tender; bowel sounds normal; no masses,  no organomegaly Extremities: extremities normal, atraumatic, no cyanosis or edema  ECOG PERFORMANCE STATUS: 1 - Symptomatic but completely ambulatory  Blood pressure 113/67, pulse 95, temperature 98.2 F (36.8 C), temperature source Oral, resp. rate 18, height 5\' 5"  (1.651 m), weight 152 lb 3.2 oz (69 kg), SpO2 97 %.  LABORATORY DATA: Lab Results  Component Value Date   WBC 11.2 (H) 08/15/2017   HGB 10.9 (L) 08/15/2017   HCT 35.4 08/15/2017   MCV 103.5 (H) 08/15/2017   PLT  225 08/15/2017      Chemistry      Component Value Date/Time   NA 141 08/15/2017 1054   NA 141 03/08/2017 1258   K 3.9 08/15/2017 1054   K 3.7 03/08/2017 1258   CL 101 08/15/2017 1054   CO2 25 08/15/2017 1054   CO2 23 03/08/2017 1258   BUN 14 08/15/2017 1054   BUN 24.1 03/08/2017 1258   CREATININE 1.06 08/15/2017 1054   CREATININE 1.4 (H) 03/08/2017 1258      Component Value Date/Time   CALCIUM 9.1 08/15/2017 1054   CALCIUM 8.8 03/08/2017 1258   ALKPHOS 88 08/15/2017 1054   ALKPHOS 65 03/08/2017 1258   AST 24 08/15/2017 1054   AST 8 03/08/2017 1258   ALT 44 08/15/2017 1054   ALT 20 03/08/2017 1258   BILITOT <0.2 (L) 08/15/2017 1054   BILITOT 0.40 03/08/2017 1258       RADIOGRAPHIC STUDIES: No results found.  ASSESSMENT AND PLAN: This is a very pleasant 72 years old African-American female with metastatic renal cell carcinoma that was initially diagnosed in 2010 and now presenting with bilateral pulmonary nodules. She was treated with stereotactic radiotherapy to hypermetabolic nodules without tissue diagnosis in the past. Recent imaging studies showed hypermetabolic and enlarging bilateral pulmonary nodules. CT-guided core biopsy of right pulmonary  nodule was consistent with metastatic renal cell carcinoma. The patient was treated with a combination of ipilimumab and nivolumab for 3 cycles but ipilimumab was discontinued secondary to significant itching that was intolerable for the patient.  She was treated with 2 more cycles of single agent nivolumab which was discontinued secondary to disease progression. She is currently undergoing treatment with oral Cabometyx 40 mg p.o. daily and has been tolerating this treatment fairly well.  She is status post 6 weeks of treatment. I recommended for the patient to continue her treatment with Cabometyx with the same dose. I will see her back for follow-up visit in 3 weeks for evaluation and repeat blood work.  We will schedule imaging studies in 6 weeks. She was advised to call immediately if she has any concerning symptoms in the interval. The patient voices understanding of current disease status and treatment options and is in agreement with the current care plan. All questions were answered. The patient knows to call the clinic with any problems, questions or concerns. We can certainly see the patient much sooner if necessary.  Disclaimer: This note was dictated with voice recognition software. Similar sounding words can inadvertently be transcribed and may not be corrected upon review.

## 2017-08-21 ENCOUNTER — Other Ambulatory Visit: Payer: Self-pay | Admitting: Internal Medicine

## 2017-08-21 ENCOUNTER — Ambulatory Visit
Admission: RE | Admit: 2017-08-21 | Discharge: 2017-08-21 | Disposition: A | Discharge status: Home / Self Care | Payer: Medicare Other | Source: Ambulatory Visit | Attending: Internal Medicine | Admitting: Internal Medicine

## 2017-08-21 DIAGNOSIS — M25552 Pain in left hip: Secondary | ICD-10-CM

## 2017-08-21 DIAGNOSIS — R0781 Pleurodynia: Secondary | ICD-10-CM

## 2017-08-21 DIAGNOSIS — C641 Malignant neoplasm of right kidney, except renal pelvis: Secondary | ICD-10-CM

## 2017-08-23 ENCOUNTER — Other Ambulatory Visit: Payer: Medicare Other

## 2017-08-23 ENCOUNTER — Ambulatory Visit: Payer: Medicare Other

## 2017-08-23 ENCOUNTER — Ambulatory Visit: Payer: Medicare Other | Admitting: Internal Medicine

## 2017-08-27 MED FILL — CABOMETYX 40 MG TABLET: 40 | 30 days supply | Qty: 30 | Fill #0

## 2017-08-28 ENCOUNTER — Other Ambulatory Visit: Payer: Self-pay | Admitting: Internal Medicine

## 2017-08-28 DIAGNOSIS — M25559 Pain in unspecified hip: Secondary | ICD-10-CM

## 2017-09-03 ENCOUNTER — Ambulatory Visit
Admission: RE | Admit: 2017-09-03 | Discharge: 2017-09-03 | Disposition: A | Discharge status: Home / Self Care | Payer: Medicare Other | Source: Ambulatory Visit | Attending: Internal Medicine | Admitting: Internal Medicine

## 2017-09-03 DIAGNOSIS — M25559 Pain in unspecified hip: Secondary | ICD-10-CM

## 2017-09-03 MED ORDER — GADOBENATE DIMEGLUMINE 529 MG/ML IV SOLN
14.0000 mL | Freq: Once | INTRAVENOUS | Status: AC | PRN
  Administered 2017-09-03: 14 mL via INTRAVENOUS

## 2017-09-07 NOTE — Assessment & Plan Note (Addendum)
This is a very pleasant 72 year old African-American female with metastatic renal cell carcinoma that was initially diagnosed in 2010 and now presenting with bilateral pulmonary nodules. She was treated with stereotactic radiotherapy to hypermetabolic nodules without tissue diagnosis in the past. Recent imaging studies showed hypermetabolic and enlarging bilateral pulmonary nodules. CT-guided core biopsy of right pulmonary nodule was consistent with metastatic renal cell carcinoma. The patient was treated with a combination of ipilimumab and nivolumab for 3 cycles but ipilimumab was discontinued secondary to significant itching that was intolerable for the patient.  She was treated with 2 more cycles of single agent nivolumab which was discontinued secondary to disease progression. She is currently undergoing treatment with oral Cabometyx 40 mg p.o. daily and has been tolerating this treatment fairly well.  She is status post 2 months of treatment. I recommended for the patient to continue her treatment with Cabometyx with the same dose. The patient will have a restaging CT scan of the chest, abdomen, pelvis in approximately 3 weeks.  She will follow-up 1 to 2 days after the CT scan to discuss the results.  She was advised to call immediately if she has any concerning symptoms in the interval. The patient voices understanding of current disease status and treatment options and is in agreement with the current care plan. All questions were answered. The patient knows to call the clinic with any problems, questions or concerns. We can certainly see the patient much sooner if necessary.

## 2017-09-07 NOTE — Progress Notes (Signed)
[place] OFFICE PROGRESS NOTE  [name], MD [place] [address] [place] [address]  DIAGNOSIS:  1) Metastatic renal cell carcinoma, clear cell type, WHO nuclear grade 3 initially diagnosed in September 2010 and recently presented with bilateral pulmonary nodules. 2) history of renal cell carcinoma status post right nephrectomy in September 2010.  PRIOR THERAPY: 1)Stereotactic radiotherapy to pulmonary nodules based on the metabolic activity with no tissue diagnosis. This was performed under the care of Dr. Pablo Ledger. 2)first line treatment with immunotherapy with Nivolumab 3 MG/KG and Ipilumumab 1 mg/Kg every 3 weeks for 4 cycles followed by maintenance Nivolumab every 3 weeks. First dose 12/27/2016. Status post 3cycles.Stopped due to severe itching. 3)Nivolumab480 mg IV every 4 weeksas a single agentstartingon 05/03/2017.Status post2cycles.Discontinued secondary to disease progression.  CURRENT THERAPY: Cabometyx 40 mg daily.First dose started on 07/04/2017.  Status post 2 months of treatment.  INTERVAL HISTORY: Melanie Best 72 y.o. female returns for routine follow-up visit accompanied by her husband.  The patient is feeling fine today and has no specific complaints.  She continues to tolerate treatment with Cabometyx fairly well.  She denies fevers and chills.  Denies chest pain, cough, hemoptysis.  She has her baseline shortness of breath and wears home oxygen.  Denies nausea, vomiting, constipation, diarrhea.  Denies recent weight loss or night sweats.  The patient is here for evaluation and repeat lab work.  MEDICAL HISTORY: Past Medical History:  Diagnosis Date  . Anemia   . Anxiety   . Arthritis    R leg  . Cancer ([facility])    kidney  . Chronic fatigue 12/13/2016  . CKD (chronic kidney disease), stage II    removed R kidney- 2010- for Cancer, followed by Dr. Diona Fanti  . COPD (chronic obstructive pulmonary disease) (H[facility])     uses O2-2 liters , continuously , followed by Dr. Chase Caller  . Cough 08-02-2015  . Diabetes mellitus, type 2 ([place])   . DM (diabetes mellitus) ([place]) 01/27/2012  . DVT (deep venous thrombosis), left    2011- treated /w coumadin   . Dyslipidemia   . Dysrhythmia   . Esophageal reflux   . GERD (gastroesophageal reflux disease) 01/27/2012  . Glaucoma   . Goals of care, counseling/discussion 12/13/2016  . Hemoptysis 08-02-15  . History of radiation therapy 01/09/12,01/11/12,01/15/12,01/17/12,& 01/19/12   rul lung,50Gy/46fx  . HTN (hypertension) 01/27/2012  . Hyperlipidemia 01/27/2012  . Hypertension    sees Dr. Deforest Hoyles for PCP, denies ever having stress or ECHO, ?when & where she had  last  EKG  . Leukocytosis   . Lung cancer ([place])    NSCLC  . Osteopenia   . Renal cell carcinoma of right kidney ([place]) 12/13/2016  . Shortness of breath   . Syncope   . Tremor   . Urinary incontinence   . Vitamin D deficiency     ALLERGIES:  has No Known Allergies.  MEDICATIONS:  Current Outpatient Medications  Medication Sig Dispense Refill  . aspirin 81 MG tablet Take 81 mg by mouth daily.    . bimatoprost (LUMIGAN) 0.01 % SOLN Place 1 drop into both eyes at bedtime.     . brinzolamide (AZOPT) 1 % ophthalmic suspension Place 1 drop into both eyes 3 (three) times daily. Reported on 08/12/2015    . CABOMETYX 40 MG tablet TAKE 1 TABLET BY MOUTH ONCE DAILY. TAKE ON AN EMPTY STOMACH 1 HOUR BEFORE OR 2 HOURS AFTER MEALS 30 tablet 1  . CALCIUM  PO Take by mouth daily.    . clonazePAM (KLONOPIN) 1 MG tablet Take 1 mg by mouth 2 (two) times daily as needed. For anxiety    . diltiazem (CARDIZEM CD) 180 MG 24 hr capsule Take 180 mg by mouth daily.  0  . ergocalciferol (VITAMIN D2) 50000 UNITS capsule Take 50,000 Units by mouth once a week. Take on Monday    . esomeprazole (NEXIUM) 40 MG capsule Take 40 mg by mouth daily before breakfast.     . furosemide (LASIX) 20 MG tablet Take 20 mg by mouth daily as needed. For  fluid retention    . gabapentin (NEURONTIN) 100 MG capsule Take 300 mg by mouth daily.  0  . ipratropium-albuterol (DUONEB) 0.5-2.5 (3) MG/3ML SOLN Take 3 mLs by nebulization 4 (four) times daily as needed. 360 mL 4  . JANUVIA 50 MG tablet Take 50 mg by mouth daily.     Marland Kitchen lidocaine-prilocaine (EMLA) cream Apply 1 application topically as needed.    . lovastatin (MEVACOR) 20 MG tablet Take 20 mg by mouth at bedtime.    . methocarbamol (ROBAXIN) 750 MG tablet Take 750 mg by mouth 2 (two) times daily as needed.  0  . OXYGEN Inhale 2 L into the lungs continuous.    . predniSONE (DELTASONE) 5 MG tablet Take 4 mg by mouth daily with breakfast.     . primidone (MYSOLINE) 50 MG tablet Take 100 mg by mouth 2 (two) times daily.     . promethazine (PHENERGAN) 25 MG tablet take 1 tablet by mouth every 6 hours if needed for nausea  0  . traMADol (ULTRAM) 50 MG tablet Take 1 tablet by mouth 3 (three) times daily as needed.  1   No current facility-administered medications for this visit.     SURGICAL HISTORY:  Past Surgical History:  Procedure Laterality Date  . BACK SURGERY     for pinched nerve, 2011, here at [facility]  . EYE SURGERY     cataracts removed, ?IOL  . FLEXIBLE BRONCHOSCOPY  11/29/2011  . IR FLUORO GUIDE PORT INSERTION RIGHT  01/30/2017  . IR US GUIDE VASC ACCESS RIGHT  01/30/2017  . removed blood clot     left upper abd quad   . right kidney removed    . right nephrectomy      REVIEW OF SYSTEMS:   Review of Systems  Constitutional: Negative for appetite change, chills, fatigue, fever and unexpected weight change.  HENT:   Negative for mouth sores, nosebleeds, sore throat and trouble swallowing.   Eyes: Negative for eye problems and icterus.  Respiratory: Negative for cough, hemoptysis, shortness of breath and wheezing.   Cardiovascular: Negative for chest pain and leg swelling.  Gastrointestinal: Negative for abdominal pain, constipation, diarrhea, nausea and vomiting.   Genitourinary: Negative for bladder incontinence, difficulty urinating, dysuria, frequency and hematuria.   Musculoskeletal: Negative for back pain, neck pain and neck stiffness.  Skin: Negative for itching and rash.  Neurological: Negative for dizziness, extremity weakness, headaches, light-headedness and seizures. Positive for tremors. Hematological: Negative for adenopathy. Does not bruise/bleed easily.  Psychiatric/Behavioral: Negative for confusion, depression and sleep disturbance. The patient is not nervous/anxious.     PHYSICAL EXAMINATION:  Blood pressure (!) 142/80, pulse (!) 111, temperature 98.5 F (36.9 C), temperature source Oral, resp. rate 18, height 5\' 5"  (1.651 m), weight 151 lb 12.8 oz (68.9 kg), SpO2 90 %.  ECOG PERFORMANCE STATUS: 1 - Symptomatic but completely ambulatory  Physical Exam  Constitutional: Oriented to person, place, and time and well-developed, well-nourished, and in no distress. No distress.  HENT:  Head: Normocephalic and atraumatic.  Mouth/Throat: Oropharynx is clear and moist. No oropharyngeal exudate.  Eyes: Conjunctivae are normal. Right eye exhibits no discharge. Left eye exhibits no discharge. No scleral icterus.  Neck: Normal range of motion. Neck supple.  Cardiovascular: Normal rate, regular rhythm, normal heart sounds and intact distal pulses.   Pulmonary/Chest: Effort normal and breath sounds normal. No respiratory distress. No wheezes. No rales.  Abdominal: Soft. Bowel sounds are normal. Exhibits no distension and no mass. There is no tenderness.  Musculoskeletal: Normal range of motion. Exhibits no edema.  Lymphadenopathy:    No cervical adenopathy.  Neurological: Alert and oriented to person, place, and time. Exhibits normal muscle tone. Coordination normal.  Skin: Skin is warm and dry. No rash noted. Not diaphoretic. No erythema. No pallor.  Psychiatric: Mood, memory and judgment normal.  Vitals reviewed.  LABORATORY DATA: Lab  Results  Component Value Date   WBC 8.7 09/10/2017   HGB 10.0 (L) 09/10/2017   HCT 30.7 (L) 09/10/2017   MCV 103.2 (H) 09/10/2017   PLT 266 09/10/2017      Chemistry      Component Value Date/Time   NA 139 09/10/2017 1322   NA 141 03/08/2017 1258   K 3.7 09/10/2017 1322   K 3.7 03/08/2017 1258   CL 99 09/10/2017 1322   CO2 31 (H) 09/10/2017 1322   CO2 23 03/08/2017 1258   BUN 7 09/10/2017 1322   BUN 24.1 03/08/2017 1258   CREATININE 1.05 09/10/2017 1322   CREATININE 1.4 (H) 03/08/2017 1258      Component Value Date/Time   CALCIUM 9.1 09/10/2017 1322   CALCIUM 8.8 03/08/2017 1258   ALKPHOS 96 09/10/2017 1322   ALKPHOS 65 03/08/2017 1258   AST 27 09/10/2017 1322   AST 8 03/08/2017 1258   ALT 36 09/10/2017 1322   ALT 20 03/08/2017 1258   BILITOT <0.2 (L) 09/10/2017 1322   BILITOT 0.40 03/08/2017 1258       RADIOGRAPHIC STUDIES:  Dg Ribs Unilateral W/chest Left  Result Date: 08/22/2017 CLINICAL DATA:  Left-sided rib cage pain following a fall 3 weeks ago. History of COPD on supplemental oxygen. History of metastatic renal cell malignancy to the lung bases. The patient was unable to tolerate more than 3 images of today's left rib series. EXAM: LEFT RIBS AND CHEST - 3+ VIEW COMPARISON:  Chest x-ray of December 04, 2016 and chest CT scan of June 26, 2017. FINDINGS: The lungs are well-expanded. There is no pneumothorax. There is linear scarring or subsegmental atelectasis in the right suprahilar region. There is no pleural effusion or pneumothorax. The heart and pulmonary vascularity are normal. There is calcification in the wall of the aortic arch. There is a power port catheter present whose tip projects over the midportion of the SVC. Two images of the upper and mid ribs include a metallic BB over the area of symptoms. Deep to this no displaced rib fractures are observed. IMPRESSION: COPD. Stable subsegmental atelectasis or scarring in the left upper lobe. No suspicious masses  are visible within the lungs today. Thoracic aortic atherosclerosis. Limited visualization of the left ribcage reveals no acute fracture. Electronically Signed   By: David  Martinique M.D.   On: 08/22/2017 07:51   Mr Hip Left W Wo Contrast  Result Date: 09/03/2017 CLINICAL DATA:  Left hip pain for the past month. History of metastatic  renal cell carcinoma. EXAM: MRI OF THE LEFT HIP WITHOUT AND WITH CONTRAST TECHNIQUE: Multiplanar, multisequence MR imaging was performed both before and after administration of intravenous contrast. CONTRAST:  59mL MULTIHANCE GADOBENATE DIMEGLUMINE 529 MG/ML IV SOLN COMPARISON:  Left hip x-rays dated August 21, 2017. CT abdomen and pelvis dated June 26, 2017. FINDINGS: Bones: Small 9 x 8 mm area of signal change in the left anterior superior femoral head most consistent with avascular necrosis. No acute fracture. The visualized sacroiliac joints and symphysis pubis appear normal. No suspicious marrow replacing lesion. Articular cartilage and labrum Articular cartilage: Mild partial-thickness cartilage loss along the anterior superior hip joint. Small subchondral cyst within the acetabulum. Labrum: Anterior superior labral degeneration without discrete tear. Joint or bursal effusion Joint effusion: No significant hip joint effusion. Bursae: No focal periarticular fluid collection. Muscles and tendons Muscles and tendons: The visualized gluteus, hamstring and iliopsoas tendons appear normal. The piriformis muscles appear symmetric. Other findings Miscellaneous: Grossly unchanged mass in the lower pole left kidney. The visualized internal pelvic contents appear unremarkable. IMPRESSION: 1. No acute osseous abnormality or evidence of metastatic disease. 2. 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 No subchondral collapse 3. Grossly unchanged left lower pole renal cell carcinoma. Electronically Signed   By:  Titus Dubin M.D.   On: 09/03/2017 13:18   Dg Hip Unilat With Pelvis 2-3 Views Left  Result Date: 08/22/2017 CLINICAL DATA:  Left hip pain since a fall 3 weeks ago. Limited range of motion. EXAM: DG HIP (WITH OR WITHOUT PELVIS) 2-3V LEFT COMPARISON:  KUB of March 21, 2017 and coronal and sagittal reconstructed images through the pelvis and hips from a CT scan dated June 26, 2017 FINDINGS: The bones are subjectively adequately mineralized. There is no acute or healing fracture. The left hip joint space is well maintained. The articular surfaces of the left femoral head and acetabulum remains smoothly rounded. The femoral neck, intertrochanteric, and immediate subtrochanteric regions are normal. IMPRESSION: There is no acute bony abnormality of the left hip. On the previous CT scan there was subarticular lucency and rim sclerosus in the superior aspect of the femoral head which likely reflects subarticular reactive change or early osteochondral defect. MRI of the left hip may be a useful next imaging step given the patient's persistent symptoms. Electronically Signed   By: David  Martinique M.D.   On: 08/22/2017 07:48     ASSESSMENT/PLAN:  Renal cell carcinoma of right kidney [facility]) This is a very pleasant 72 year old African-American female with metastatic renal cell carcinoma that was initially diagnosed in 2010 and now presenting with bilateral pulmonary nodules. She was treated with stereotactic radiotherapy to hypermetabolic nodules without tissue diagnosis in the past. Recent imaging studies showed hypermetabolic and enlarging bilateral pulmonary nodules. CT-guided core biopsy of right pulmonary nodule was consistent with metastatic renal cell carcinoma. The patient was treated with a combination of ipilimumab and nivolumab for 3 cycles but ipilimumab was discontinued secondary to significant itching that was intolerable for the patient.  She was treated with 2 more cycles of single agent nivolumab  which was discontinued secondary to disease progression. She is currently undergoing treatment with oral Cabometyx 40 mg p.o. daily and has been tolerating this treatment fairly well.  She is status post 2 months of treatment. I recommended for the patient to continue her treatment with Cabometyx with the same dose. The patient will have a restaging  CT scan of the chest, abdomen, pelvis in approximately 3 weeks.  She will follow-up 1 to 2 days after the CT scan to discuss the results.  She was advised to call immediately if she has any concerning symptoms in the interval. The patient voices understanding of current disease status and treatment options and is in agreement with the current care plan. All questions were answered. The patient knows to call the clinic with any problems, questions or concerns. We can certainly see the patient much sooner if necessary.   Orders Placed This Encounter  Procedures  . CT CHEST W CONTRAST    Standing Status:   Future    Standing Expiration Date:   09/11/2018    Order Specific Question:   If indicated for the ordered procedure, I authorize the administration of contrast media per Radiology protocol    Answer:   Yes    Order Specific Question:   Preferred imaging location?    Answer:   [facility]    Order Specific Question:   Radiology Contrast Protocol - do NOT remove file path    Answer:   \\[email or web address]    Order Specific Question:   ** REASON FOR EXAM (FREE TEXT)    Answer:   Renal cell ca. Restaging.  . CT Abdomen Pelvis W Wo Contrast    This exam should ONLY be ordered for initial diagnosis or follow up of known pancreatic/liver/renal/bladder masses.    Standing Status:   Future    Standing Expiration Date:   12/12/2018    Order Specific Question:   If indicated for the ordered procedure, I authorize the administration of contrast media per Radiology protocol    Answer:   Yes    Order Specific Question:    Preferred imaging location?    Answer:   [facility]    Order Specific Question:   Is Oral Contrast requested for this exam?    Answer:   Yes, Per Radiology protocol    Order Specific Question:   Radiology Contrast Protocol - do NOT remove file path    Answer:   \\[email or web address]    Order Specific Question:   ** REASON FOR EXAM (FREE TEXT)    Answer:   Renal cell ca. Restaging.  Marland Kitchen CBC with Differential (Cancer Center Only)    Standing Status:   Future    Standing Expiration Date:   09/11/2018  . CMP ([place] only)    Standing Status:   Future    Standing Expiration Date:   09/11/2018   Mikey Bussing, DNP, AGPCNP-BC, AOCNP 09/10/17

## 2017-09-10 ENCOUNTER — Encounter: Payer: Self-pay | Admitting: Oncology

## 2017-09-10 ENCOUNTER — Inpatient Hospital Stay: Payer: Medicare Other

## 2017-09-10 ENCOUNTER — Encounter: Payer: Self-pay | Admitting: Internal Medicine

## 2017-09-10 ENCOUNTER — Other Ambulatory Visit: Payer: Self-pay

## 2017-09-10 ENCOUNTER — Telehealth: Payer: Self-pay | Admitting: Internal Medicine

## 2017-09-10 ENCOUNTER — Inpatient Hospital Stay: Payer: Medicare Other | Attending: Internal Medicine | Admitting: Oncology

## 2017-09-10 VITALS — BP 142/80 | HR 111 | Temp 98.5°F | Resp 18 | Ht 65.0 in | Wt 151.8 lb

## 2017-09-10 DIAGNOSIS — R0602 Shortness of breath: Secondary | ICD-10-CM | POA: Insufficient documentation

## 2017-09-10 DIAGNOSIS — C641 Malignant neoplasm of right kidney, except renal pelvis: Secondary | ICD-10-CM

## 2017-09-10 DIAGNOSIS — Z95828 Presence of other vascular implants and grafts: Secondary | ICD-10-CM

## 2017-09-10 DIAGNOSIS — Z9981 Dependence on supplemental oxygen: Secondary | ICD-10-CM | POA: Diagnosis not present

## 2017-09-10 DIAGNOSIS — Z5111 Encounter for antineoplastic chemotherapy: Secondary | ICD-10-CM

## 2017-09-10 DIAGNOSIS — C7801 Secondary malignant neoplasm of right lung: Secondary | ICD-10-CM

## 2017-09-10 DIAGNOSIS — R5382 Chronic fatigue, unspecified: Secondary | ICD-10-CM

## 2017-09-10 DIAGNOSIS — Z79899 Other long term (current) drug therapy: Secondary | ICD-10-CM | POA: Diagnosis not present

## 2017-09-10 LAB — CBC WITH DIFFERENTIAL (CANCER CENTER ONLY)
BASOS ABS: 0 10*3/uL (ref 0.0–0.1)
BASOS PCT: 0 %
EOS PCT: 3 %
Eosinophils Absolute: 0.3 10*3/uL (ref 0.0–0.5)
HCT: 30.7 % — ABNORMAL LOW (ref 34.8–46.6)
Hemoglobin: 10 g/dL — ABNORMAL LOW (ref 11.6–15.9)
Lymphocytes Relative: 19 %
Lymphs Abs: 1.6 10*3/uL (ref 0.9–3.3)
MCH: 33.6 pg (ref 25.1–34.0)
MCHC: 32.5 g/dL (ref 31.5–36.0)
MCV: 103.2 fL — AB (ref 79.5–101.0)
Monocytes Absolute: 0.4 10*3/uL (ref 0.1–0.9)
Monocytes Relative: 4 %
Neutro Abs: 6.4 10*3/uL (ref 1.5–6.5)
Neutrophils Relative %: 74 %
PLATELETS: 266 10*3/uL (ref 145–400)
RBC: 2.97 MIL/uL — ABNORMAL LOW (ref 3.70–5.45)
RDW: 19 % — AB (ref 11.2–14.5)
WBC Count: 8.7 10*3/uL (ref 3.9–10.3)

## 2017-09-10 LAB — CMP (CANCER CENTER ONLY)
ALK PHOS: 96 U/L (ref 40–150)
ALT: 36 U/L (ref 0–55)
AST: 27 U/L (ref 5–34)
Albumin: 3.2 g/dL — ABNORMAL LOW (ref 3.5–5.0)
Anion gap: 9 (ref 3–11)
BUN: 7 mg/dL (ref 7–26)
CALCIUM: 9.1 mg/dL (ref 8.4–10.4)
CO2: 31 mmol/L — ABNORMAL HIGH (ref 22–29)
Chloride: 99 mmol/L (ref 98–109)
Creatinine: 1.05 mg/dL (ref 0.60–1.10)
GFR, Est AFR Am: >60 mL/min (ref >60)
GFR, Estimated: 53 mL/min — ABNORMAL LOW (ref >60)
GLUCOSE: 141 mg/dL — AB (ref 70–140)
Potassium: 3.7 mmol/L (ref 3.5–5.1)
Sodium: 139 mmol/L (ref 136–145)
TOTAL PROTEIN: 6.7 g/dL (ref 6.4–8.3)

## 2017-09-10 LAB — TSH: TSH: 2.018 u[IU]/mL (ref 0.308–3.960)

## 2017-09-10 LAB — LACTATE DEHYDROGENASE: LDH: 335 U/L — ABNORMAL HIGH (ref 125–245)

## 2017-09-10 MED ORDER — SODIUM CHLORIDE 0.9% FLUSH
10.0000 mL | INTRAVENOUS | Status: DC | PRN
Start: 2017-09-10 — End: 2017-09-10
  Administered 2017-09-10: 10 mL via INTRAVENOUS
  Filled 2017-09-10: qty 10

## 2017-09-10 MED ORDER — HEPARIN SOD (PORK) LOCK FLUSH 100 UNIT/ML IV SOLN
500.0000 [IU] | Freq: Once | INTRAVENOUS | Status: AC | PRN
  Administered 2017-09-10: 500 [IU] via INTRAVENOUS
  Filled 2017-09-10: qty 5

## 2017-09-10 NOTE — Progress Notes (Signed)
Patient and spouse came in with questions regarding bill. Reviewed and advised patient to call billing to inquire about due to unsure about their notes and having another patient coming in with an appointment. She verbalized understanding. Also gave her the hardship settlement application to complete if assistance still needed after resolved with billing. She verbalized understanding.

## 2017-09-10 NOTE — Telephone Encounter (Signed)
Spoke with patient re appointments for 7/15 and 7/16. Per 6/24 los f/u 1-2 days after scan and ok with [place] on day GM in office. Central radiology will call patient re ct.

## 2017-09-13 ENCOUNTER — Ambulatory Visit
Admission: RE | Admit: 2017-09-13 | Discharge: 2017-09-13 | Disposition: A | Discharge status: Home / Self Care | Payer: Medicare Other | Source: Ambulatory Visit | Attending: Internal Medicine | Admitting: Internal Medicine

## 2017-09-13 ENCOUNTER — Other Ambulatory Visit: Payer: Medicare Other

## 2017-09-13 ENCOUNTER — Other Ambulatory Visit: Payer: Self-pay | Admitting: Internal Medicine

## 2017-09-13 DIAGNOSIS — M7989 Other specified soft tissue disorders: Secondary | ICD-10-CM

## 2017-09-27 MED FILL — CABOMETYX 40 MG TABLET: 40 | 30 days supply | Qty: 30 | Fill #1

## 2017-10-01 ENCOUNTER — Inpatient Hospital Stay: Payer: Medicare Other | Attending: Internal Medicine

## 2017-10-01 ENCOUNTER — Inpatient Hospital Stay: Payer: Medicare Other

## 2017-10-01 ENCOUNTER — Ambulatory Visit (HOSPITAL_COMMUNITY)
Admission: RE | Admit: 2017-10-01 | Discharge: 2017-10-01 | Disposition: A | Discharge status: Home / Self Care | Payer: Medicare Other | Source: Ambulatory Visit | Attending: Oncology | Admitting: Oncology

## 2017-10-01 DIAGNOSIS — R21 Rash and other nonspecific skin eruption: Secondary | ICD-10-CM | POA: Insufficient documentation

## 2017-10-01 DIAGNOSIS — C641 Malignant neoplasm of right kidney, except renal pelvis: Secondary | ICD-10-CM | POA: Insufficient documentation

## 2017-10-01 DIAGNOSIS — Z905 Acquired absence of kidney: Secondary | ICD-10-CM | POA: Diagnosis not present

## 2017-10-01 DIAGNOSIS — R918 Other nonspecific abnormal finding of lung field: Secondary | ICD-10-CM | POA: Diagnosis not present

## 2017-10-01 DIAGNOSIS — Z9981 Dependence on supplemental oxygen: Secondary | ICD-10-CM | POA: Diagnosis not present

## 2017-10-01 DIAGNOSIS — L0231 Cutaneous abscess of buttock: Secondary | ICD-10-CM | POA: Diagnosis not present

## 2017-10-01 DIAGNOSIS — I82622 Acute embolism and thrombosis of deep veins of left upper extremity: Secondary | ICD-10-CM | POA: Insufficient documentation

## 2017-10-01 DIAGNOSIS — R5382 Chronic fatigue, unspecified: Secondary | ICD-10-CM

## 2017-10-01 DIAGNOSIS — Z79899 Other long term (current) drug therapy: Secondary | ICD-10-CM | POA: Insufficient documentation

## 2017-10-01 DIAGNOSIS — M898X8 Other specified disorders of bone, other site: Secondary | ICD-10-CM | POA: Diagnosis not present

## 2017-10-01 DIAGNOSIS — R5383 Other fatigue: Secondary | ICD-10-CM | POA: Insufficient documentation

## 2017-10-01 DIAGNOSIS — C7801 Secondary malignant neoplasm of right lung: Secondary | ICD-10-CM | POA: Diagnosis not present

## 2017-10-01 DIAGNOSIS — R0609 Other forms of dyspnea: Secondary | ICD-10-CM | POA: Insufficient documentation

## 2017-10-01 LAB — CBC WITH DIFFERENTIAL/PLATELET
Basophils Absolute: 0.1 10*3/uL (ref 0.0–0.1)
Basophils Relative: 1 %
EOS ABS: 0.5 10*3/uL (ref 0.0–0.5)
Eosinophils Relative: 6 %
HEMATOCRIT: 32 % — AB (ref 34.8–46.6)
HEMOGLOBIN: 10.5 g/dL — AB (ref 11.6–15.9)
LYMPHS ABS: 1.4 10*3/uL (ref 0.9–3.3)
Lymphocytes Relative: 16 %
MCH: 34.6 pg — AB (ref 25.1–34.0)
MCHC: 32.8 g/dL (ref 31.5–36.0)
MCV: 105.4 fL — ABNORMAL HIGH (ref 79.5–101.0)
MONOS PCT: 5 %
Monocytes Absolute: 0.4 10*3/uL (ref 0.1–0.9)
NEUTROS PCT: 72 %
Neutro Abs: 6.5 10*3/uL (ref 1.5–6.5)
Platelets: 247 10*3/uL (ref 145–400)
RBC: 3.04 MIL/uL — ABNORMAL LOW (ref 3.70–5.45)
RDW: 19.6 % — ABNORMAL HIGH (ref 11.2–14.5)
WBC: 8.7 10*3/uL (ref 3.9–10.3)

## 2017-10-01 LAB — TSH: TSH: 1.671 u[IU]/mL (ref 0.308–3.960)

## 2017-10-01 LAB — COMPREHENSIVE METABOLIC PANEL
ALBUMIN: 3.4 g/dL — AB (ref 3.5–5.0)
ALT: 25 U/L (ref 0–44)
AST: 20 U/L (ref 15–41)
Alkaline Phosphatase: 91 U/L (ref 38–126)
Anion gap: 9 (ref 5–15)
BUN: 8 mg/dL (ref 8–23)
CHLORIDE: 105 mmol/L (ref 98–111)
CO2: 30 mmol/L (ref 22–32)
CREATININE: 1.03 mg/dL — AB (ref 0.44–1.00)
Calcium: 9.1 mg/dL (ref 8.9–10.3)
GFR calc non Af Amer: 54 mL/min — ABNORMAL LOW (ref >60)
GLUCOSE: 84 mg/dL (ref 70–99)
Potassium: 3.5 mmol/L (ref 3.5–5.1)
SODIUM: 144 mmol/L (ref 135–145)
Total Bilirubin: <0.2 mg/dL — ABNORMAL LOW (ref 0.3–1.2)
Total Protein: 6.7 g/dL (ref 6.5–8.1)

## 2017-10-01 MED ORDER — IOPAMIDOL (ISOVUE-300) INJECTION 61%
INTRAVENOUS | Status: AC
  Filled 2017-10-01: qty 100

## 2017-10-01 MED ORDER — IOPAMIDOL (ISOVUE-300) INJECTION 61%
100.0000 mL | Freq: Once | INTRAVENOUS | Status: AC | PRN
  Administered 2017-10-01: 100 mL via INTRAVENOUS

## 2017-10-01 MED ORDER — SODIUM CHLORIDE 0.9% FLUSH
10.0000 mL | INTRAVENOUS | Status: AC | PRN
  Filled 2017-10-01: qty 10

## 2017-10-02 ENCOUNTER — Telehealth: Payer: Self-pay | Admitting: Medical Oncology

## 2017-10-02 ENCOUNTER — Ambulatory Visit: Payer: Medicare Other | Admitting: Oncology

## 2017-10-02 NOTE — Telephone Encounter (Signed)
Watery Diarrhea -started 1 week ago and has 3 episodes x a day. On Cabometyx-second bottle w 3  pills left. Took Pepto Bismol and it was not effective. She does not need to wear diapers or pads. I instructed Laylia to get Imodium AS and to take 2 tablets after 1st diarrhea stool of day then 1 tablet after each successive stool up to 8/day.

## 2017-10-02 NOTE — Telephone Encounter (Signed)
Hold the Cabometyx for 3 days per [facility] . Pt notified.

## 2017-10-08 ENCOUNTER — Inpatient Hospital Stay: Payer: Medicare Other | Admitting: Internal Medicine

## 2017-10-08 ENCOUNTER — Telehealth: Payer: Self-pay | Admitting: Internal Medicine

## 2017-10-08 ENCOUNTER — Encounter: Payer: Self-pay | Admitting: Internal Medicine

## 2017-10-08 VITALS — BP 124/70 | HR 96 | Temp 98.6°F | Resp 18 | Ht 65.0 in | Wt 152.0 lb

## 2017-10-08 DIAGNOSIS — C7801 Secondary malignant neoplasm of right lung: Secondary | ICD-10-CM | POA: Diagnosis not present

## 2017-10-08 DIAGNOSIS — L0231 Cutaneous abscess of buttock: Secondary | ICD-10-CM

## 2017-10-08 DIAGNOSIS — Z9981 Dependence on supplemental oxygen: Secondary | ICD-10-CM

## 2017-10-08 DIAGNOSIS — R5383 Other fatigue: Secondary | ICD-10-CM

## 2017-10-08 DIAGNOSIS — C641 Malignant neoplasm of right kidney, except renal pelvis: Secondary | ICD-10-CM | POA: Diagnosis not present

## 2017-10-08 DIAGNOSIS — Z5111 Encounter for antineoplastic chemotherapy: Secondary | ICD-10-CM

## 2017-10-08 DIAGNOSIS — C649 Malignant neoplasm of unspecified kidney, except renal pelvis: Secondary | ICD-10-CM

## 2017-10-08 DIAGNOSIS — I82622 Acute embolism and thrombosis of deep veins of left upper extremity: Secondary | ICD-10-CM | POA: Diagnosis not present

## 2017-10-08 DIAGNOSIS — R0609 Other forms of dyspnea: Secondary | ICD-10-CM

## 2017-10-08 DIAGNOSIS — I10 Essential (primary) hypertension: Secondary | ICD-10-CM

## 2017-10-08 MED ORDER — CEPHALEXIN 500 MG PO CAPS: 500.0000 mg | ORAL_CAPSULE | Freq: Four times a day (QID) | ORAL | 0 refills | Status: DC

## 2017-10-08 NOTE — Telephone Encounter (Signed)
Gave patient avs and calendar of upcoming appts.  °

## 2017-10-08 NOTE — Progress Notes (Signed)
[facility] [phone]   [phone]) [phone]  OFFICE PROGRESS NOTE  Melanie Low, MD [address] [place] [address]  DIAGNOSIS:  1) Metastatic renal cell carcinoma, clear cell type, WHO nuclear grade 3 initially diagnosed in September 2010 and recently presented with bilateral pulmonary nodules. 2) history of renal cell carcinoma status post right nephrectomy in September 2010.  PRIOR THERAPY: 1)Stereotactic radiotherapy to pulmonary nodules based on the metabolic activity with no tissue diagnosis. This was performed under the care of Dr. Pablo Ledger. 2)first line treatment with immunotherapy with Nivolumab 3 MG/KG and Ipilumumab 1 mg/Kg every 3 weeks for 4 cycles followed by maintenance Nivolumab every 3 weeks. First dose 12/27/2016. Status post 3cycles.Stopped due to severe itching. 3) Nivolumab480 mg IV every 4 weeksas a single agentstartingon 05/03/2017.Status post2cycles.  Discontinued secondary to disease progression.  CURRENT THERAPY: Cabometyx 40 mg daily.  First dose started on 07/04/2017.  INTERVAL HISTORY: Melanie Best 72 y.o. female returns to the clinic today for follow-up visit accompanied by her husband.  The patient is feeling fine today with no specific complaints except for fatigue and small boil in the buttock area.  She denied having any current chest pain, shortness of breath except with exertion but she is currently on home oxygen.  She denied having any cough or hemoptysis.  She denied having any fever or chills.  She has no nausea, vomiting, diarrhea or constipation.  She denied having any recent weight loss or night sweats.  She has been tolerating her treatment with Cabometyx fairly well.  She was recently diagnosed with deep venous thrombosis of the upper extremities and she is currently on treatment with Xarelto by her primary care physician. The patient had repeat CT scan of the chest, abdomen and  pelvis performed recently and she is here for evaluation and discussion of her risk her results.  MEDICAL HISTORY: Past Medical History:  Diagnosis Date  . Anemia   . Anxiety   . Arthritis    R leg  . Cancer ([facility])    kidney  . Chronic fatigue 12/13/2016  . CKD (chronic kidney disease), stage II    removed R kidney- 2010- for Cancer, followed by Dr. Diona Fanti  . COPD (chronic obstructive pulmonary disease) ([facility])    uses O2-2 liters , continuously , followed by Dr. Chase Caller  . Cough 08-02-2015  . Diabetes mellitus, type 2 ([facility])   . DM (diabetes mellitus) ([facility]) 01/27/2012  . DVT (deep venous thrombosis), left    2011- treated /w coumadin   . Dyslipidemia   . Dysrhythmia   . Esophageal reflux   . GERD (gastroesophageal reflux disease) 01/27/2012  . Glaucoma   . Goals of care, counseling/discussion 12/13/2016  . Hemoptysis 08-02-15  . History of radiation therapy 01/09/12,01/11/12,01/15/12,01/17/12,& 01/19/12   rul lung,50Gy/10fx  . HTN (hypertension) 01/27/2012  . Hyperlipidemia 01/27/2012  . Hypertension    sees Dr. Deforest Hoyles for PCP, denies ever having stress or ECHO, ?when & where she had  last  EKG  . Leukocytosis   . Lung cancer ([facility])    NSCLC  . Osteopenia   . Renal cell carcinoma of right kidney ([facility]) 12/13/2016  . Shortness of breath   . Syncope   . Tremor   . Urinary incontinence   . Vitamin D deficiency     ALLERGIES:  has No Known Allergies.  MEDICATIONS:  Current Outpatient Medications  Medication Sig Dispense Refill  . bimatoprost (LUMIGAN)  0.01 % SOLN Place 1 drop into both eyes at bedtime.     . brinzolamide (AZOPT) 1 % ophthalmic suspension Place 1 drop into both eyes 3 (three) times daily. Reported on 08/12/2015    . CABOMETYX 40 MG tablet TAKE 1 TABLET BY MOUTH ONCE DAILY. TAKE ON AN EMPTY STOMACH 1 HOUR BEFORE OR 2 HOURS AFTER MEALS 30 tablet 1  . CALCIUM PO Take by mouth daily.    . clonazePAM (KLONOPIN) 1 MG tablet Take 1 mg by mouth 2 (two) times daily as  needed. For anxiety    . diltiazem (CARDIZEM CD) 180 MG 24 hr capsule Take 180 mg by mouth daily.  0  . ergocalciferol (VITAMIN D2) 50000 UNITS capsule Take 50,000 Units by mouth once a week. Take on Monday    . esomeprazole (NEXIUM) 40 MG capsule Take 40 mg by mouth daily before breakfast.     . furosemide (LASIX) 20 MG tablet Take 20 mg by mouth daily as needed. For fluid retention    . gabapentin (NEURONTIN) 100 MG capsule Take 300 mg by mouth daily.  0  . ipratropium-albuterol (DUONEB) 0.5-2.5 (3) MG/3ML SOLN Take 3 mLs by nebulization 4 (four) times daily as needed. 360 mL 4  . JANUVIA 50 MG tablet Take 50 mg by mouth daily.     Marland Kitchen lidocaine-prilocaine (EMLA) cream Apply 1 application topically as needed.    . lovastatin (MEVACOR) 20 MG tablet Take 20 mg by mouth at bedtime.    . methocarbamol (ROBAXIN) 750 MG tablet Take 750 mg by mouth 2 (two) times daily as needed.  0  . OXYGEN Inhale 2 L into the lungs continuous.    . predniSONE (DELTASONE) 5 MG tablet Take 4 mg by mouth daily with breakfast.     . primidone (MYSOLINE) 50 MG tablet Take 100 mg by mouth 2 (two) times daily.     . promethazine (PHENERGAN) 25 MG tablet take 1 tablet by mouth every 6 hours if needed for nausea  0  . Rivaroxaban (XARELTO PO) Take by mouth.    . traMADol (ULTRAM) 50 MG tablet Take 1 tablet by mouth 3 (three) times daily as needed.  1   No current facility-administered medications for this visit.    Facility-Administered Medications Ordered in Other Visits  Medication Dose Route Frequency Provider Last Rate Last Dose  . sodium chloride flush (NS) 0.9 % injection 10 mL  10 mL Intravenous PRN Curt Bears, MD        SURGICAL HISTORY:  Past Surgical History:  Procedure Laterality Date  . BACK SURGERY     for pinched nerve, 2011, here at [facility]  . EYE SURGERY     cataracts removed, ?IOL  . FLEXIBLE BRONCHOSCOPY  11/29/2011  . IR FLUORO GUIDE PORT INSERTION RIGHT  01/30/2017  . IR US GUIDE VASC ACCESS  RIGHT  01/30/2017  . removed blood clot     left upper abd quad   . right kidney removed    . right nephrectomy      REVIEW OF SYSTEMS:  Constitutional: positive for fatigue Eyes: negative Ears, nose, mouth, throat, and face: negative Respiratory: positive for dyspnea on exertion Cardiovascular: negative Gastrointestinal: negative Genitourinary:negative Integument/breast: negative Hematologic/lymphatic: negative Musculoskeletal:negative Neurological: negative Behavioral/Psych: negative Endocrine: negative Allergic/Immunologic: negative   PHYSICAL EXAMINATION: General appearance: alert, cooperative, fatigued and no distress Head: Normocephalic, without obvious abnormality, atraumatic Neck: no adenopathy, no JVD, supple, symmetrical, trachea midline and thyroid not enlarged, symmetric, no tenderness/mass/nodules Lymph  nodes: Cervical, supraclavicular, and axillary nodes normal. Resp: clear to auscultation bilaterally Back: symmetric, no curvature. ROM normal. No CVA tenderness. Cardio: regular rate and rhythm, S1, S2 normal, no murmur, click, rub or gallop GI: soft, non-tender; bowel sounds normal; no masses,  no organomegaly Extremities: extremities normal, atraumatic, no cyanosis or edema Neurologic: Alert and oriented X 3, normal strength and tone. Normal symmetric reflexes. Normal coordination and gait  ECOG PERFORMANCE STATUS: 1 - Symptomatic but completely ambulatory  Blood pressure 124/70, pulse 96, temperature 98.6 F (37 C), temperature source Oral, resp. rate 18, height 5\' 5"  (1.651 m), weight 152 lb (68.9 kg), SpO2 100 %.  LABORATORY DATA: Lab Results  Component Value Date   WBC 8.7 10/01/2017   HGB 10.5 (L) 10/01/2017   HCT 32.0 (L) 10/01/2017   MCV 105.4 (H) 10/01/2017   PLT 247 10/01/2017      Chemistry      Component Value Date/Time   NA 144 10/01/2017 1340   NA 141 03/08/2017 1258   K 3.5 10/01/2017 1340   K 3.7 03/08/2017 1258   CL 105 10/01/2017  1340   CO2 30 10/01/2017 1340   CO2 23 03/08/2017 1258   BUN 8 10/01/2017 1340   BUN 24.1 03/08/2017 1258   CREATININE 1.03 (H) 10/01/2017 1340   CREATININE 1.05 09/10/2017 1322   CREATININE 1.4 (H) 03/08/2017 1258      Component Value Date/Time   CALCIUM 9.1 10/01/2017 1340   CALCIUM 8.8 03/08/2017 1258   ALKPHOS 91 10/01/2017 1340   ALKPHOS 65 03/08/2017 1258   AST 20 10/01/2017 1340   AST 27 09/10/2017 1322   AST 8 03/08/2017 1258   ALT 25 10/01/2017 1340   ALT 36 09/10/2017 1322   ALT 20 03/08/2017 1258   BILITOT <0.2 (L) 10/01/2017 1340   BILITOT <0.2 (L) 09/10/2017 1322   BILITOT 0.40 03/08/2017 1258       RADIOGRAPHIC STUDIES: Ct Abdomen Pelvis W Wo Contrast  Result Date: 10/01/2017 CLINICAL DATA:  Metastatic renal cell carcinoma. Post RIGHT nephrectomy. Trials immunotherapy altered for adverse reaction disease progression EXAM: CT CHEST WITH CONTRAST CT ABDOMEN AND PELVIS WITH AND WITHOUT CONTRAST TECHNIQUE: Multidetector CT imaging of the chest was performed during intravenous contrast administration. Multidetector CT imaging of the abdomen and pelvis was performed following the standard protocol before and during bolus administration of intravenous contrast. CONTRAST:  124mL ISOVUE-300 IOPAMIDOL (ISOVUE-300) INJECTION 61% COMPARISON:  CT 06/26/2017 FINDINGS: CT CHEST FINDINGS Cardiovascular: Port in the anterior chest wall with tip in distal SVC. Coronary artery calcification and aortic atherosclerotic calcification. Mediastinum/Nodes: No axillary supraclavicular adenopathy. No mediastinal hilar adenopathy. No pericardial fluid. Esophagus normal. Lungs/Pleura: LEFT lower lobe nodule adjacent to the pulmonary vessels measures 10 mm (image 89/12) compared with 10 mm. No new nodularity. Two small subpleural nodules in the LEFT upper lobe measuring 3 mm (image 15/12) are not changed. In the RIGHT upper lobe band of pleural-parenchymal thickening is unchanged measuring 20 mm by 10  mm. No new pulmonary nodules. Musculoskeletal: No aggressive osseous lesion CT ABDOMEN AND PELVIS FINDINGS Hepatobiliary: No focal hepatic lesion. No biliary duct dilatation. Gallbladder is normal. Common bile duct is normal. Pancreas: Pancreas is normal. No ductal dilatation. No pancreatic inflammation. Spleen: Normal spleen Adrenals/urinary tract: Adrenal glands normal. Irregular enhancing mass in the lower pole of the LEFT kidney measures 44 by 45 mm compared to 62 x 16 mm. Lesion has a slightly more cystic appearance compared to prior. There decreased enhancement of  the tumor thrombus within the LEFT renal vein compared to prior. The vein is distended to 17 mm compared to 21 mm on prior (image 35/6). The lower pole LEFT renal mass is contained within the pararenal fascia. The mass abuts the LEFT psoas muscle but there is no evidence of direct invasion. No retroperitoneal lymphadenopathy. Post RIGHT nephrectomy. Stomach/Bowel: Stomach, small bowel, appendix, and cecum are normal. The colon and rectosigmoid colon are normal. Vascular/Lymphatic: Abdominal aorta is normal caliber. No periportal or retroperitoneal adenopathy. No pelvic adenopathy. Reproductive: Uterus normal. Other: No free fluid. Musculoskeletal: A 7 mm subtle sclerotic lesion in the L1 vertebral body (image 97/11 and image 60/15). Lesion not changed in size but slightly more dense compared to CT 06/26/2017 and 03/02/2017. IMPRESSION: Chest Impression: 1. Stable rounded perivascular nodules in the LEFT lower lobe. 2. Stable band like density in the RIGHT upper lobe. 3. No evidence of disease progression in thorax. Abdomen / Pelvis Impression: 1. Decrease in volume of renal mass in the lower pole LEFT kidney. 2. Decrease in size and enhancement of LEFT renal vein tumor thrombus. 3. No retroperitoneal lymphadenopathy. 4. RIGHT nephrectomy noted. 5. Sclerotic lesion in the L1 vertebral body is slightly more prominent but not changed in size from  comparison exams. Recommend attention on follow-up. Electronically Signed   By: Suzy Bouchard M.D.   On: 10/01/2017 15:50   Ct Chest W Contrast  Result Date: 10/01/2017 CLINICAL DATA:  Metastatic renal cell carcinoma. Post RIGHT nephrectomy. Trials immunotherapy altered for adverse reaction disease progression EXAM: CT CHEST WITH CONTRAST CT ABDOMEN AND PELVIS WITH AND WITHOUT CONTRAST TECHNIQUE: Multidetector CT imaging of the chest was performed during intravenous contrast administration. Multidetector CT imaging of the abdomen and pelvis was performed following the standard protocol before and during bolus administration of intravenous contrast. CONTRAST:  176mL ISOVUE-300 IOPAMIDOL (ISOVUE-300) INJECTION 61% COMPARISON:  CT 06/26/2017 FINDINGS: CT CHEST FINDINGS Cardiovascular: Port in the anterior chest wall with tip in distal SVC. Coronary artery calcification and aortic atherosclerotic calcification. Mediastinum/Nodes: No axillary supraclavicular adenopathy. No mediastinal hilar adenopathy. No pericardial fluid. Esophagus normal. Lungs/Pleura: LEFT lower lobe nodule adjacent to the pulmonary vessels measures 10 mm (image 89/12) compared with 10 mm. No new nodularity. Two small subpleural nodules in the LEFT upper lobe measuring 3 mm (image 15/12) are not changed. In the RIGHT upper lobe band of pleural-parenchymal thickening is unchanged measuring 20 mm by 10 mm. No new pulmonary nodules. Musculoskeletal: No aggressive osseous lesion CT ABDOMEN AND PELVIS FINDINGS Hepatobiliary: No focal hepatic lesion. No biliary duct dilatation. Gallbladder is normal. Common bile duct is normal. Pancreas: Pancreas is normal. No ductal dilatation. No pancreatic inflammation. Spleen: Normal spleen Adrenals/urinary tract: Adrenal glands normal. Irregular enhancing mass in the lower pole of the LEFT kidney measures 44 by 45 mm compared to 62 x 16 mm. Lesion has a slightly more cystic appearance compared to prior. There  decreased enhancement of the tumor thrombus within the LEFT renal vein compared to prior. The vein is distended to 17 mm compared to 21 mm on prior (image 35/6). The lower pole LEFT renal mass is contained within the pararenal fascia. The mass abuts the LEFT psoas muscle but there is no evidence of direct invasion. No retroperitoneal lymphadenopathy. Post RIGHT nephrectomy. Stomach/Bowel: Stomach, small bowel, appendix, and cecum are normal. The colon and rectosigmoid colon are normal. Vascular/Lymphatic: Abdominal aorta is normal caliber. No periportal or retroperitoneal adenopathy. No pelvic adenopathy. Reproductive: Uterus normal. Other: No free fluid. Musculoskeletal: A  7 mm subtle sclerotic lesion in the L1 vertebral body (image 97/11 and image 60/15). Lesion not changed in size but slightly more dense compared to CT 06/26/2017 and 03/02/2017. IMPRESSION: Chest Impression: 1. Stable rounded perivascular nodules in the LEFT lower lobe. 2. Stable band like density in the RIGHT upper lobe. 3. No evidence of disease progression in thorax. Abdomen / Pelvis Impression: 1. Decrease in volume of renal mass in the lower pole LEFT kidney. 2. Decrease in size and enhancement of LEFT renal vein tumor thrombus. 3. No retroperitoneal lymphadenopathy. 4. RIGHT nephrectomy noted. 5. Sclerotic lesion in the L1 vertebral body is slightly more prominent but not changed in size from comparison exams. Recommend attention on follow-up. Electronically Signed   By: Suzy Bouchard M.D.   On: 10/01/2017 15:50   US Venous Img Upper Uni Left  Result Date: 09/13/2017 CLINICAL DATA:  72 year old female with left arm pain and swelling in the antecubital fossa. EXAM: LEFT UPPER EXTREMITY VENOUS DOPPLER ULTRASOUND TECHNIQUE: Gray-scale sonography with graded compression, as well as color Doppler and duplex ultrasound were performed to evaluate the upper extremity deep venous system from the level of the subclavian vein and including the  jugular, axillary, basilic, radial, ulnar and upper cephalic vein. Spectral Doppler was utilized to evaluate flow at rest and with distal augmentation maneuvers. COMPARISON:  None. FINDINGS: Contralateral Subclavian Vein: Respiratory phasicity is normal and symmetric with the symptomatic side. No evidence of thrombus. Normal compressibility. Internal Jugular Vein: No evidence of thrombus. Normal compressibility, respiratory phasicity and response to augmentation. Subclavian Vein: No evidence of thrombus. Normal compressibility, respiratory phasicity and response to augmentation. Axillary Vein: Occlusive thrombus extends into the peripheral axillary vein. The vein is noncompressible in the lumen is filled with Best-level internal echoes. No evidence of flow on color Doppler evaluation. More centrally, the axillary vein is patent and compressible. Cephalic Vein: No evidence of thrombus. Normal compressibility, respiratory phasicity and response to augmentation. Basilic Vein: Occlusive thrombus present within the superficial basilic vein in the antecubital fossa extending to the mid arm. Brachial Veins: 1 of the paired brachial veins is noncompressible and demonstrates no evidence of color flow on color Doppler evaluation consistent with occlusive thrombus. Radial Veins: No evidence of thrombus. Normal compressibility, respiratory phasicity and response to augmentation. Ulnar Veins: No evidence of thrombus. Normal compressibility, respiratory phasicity and response to augmentation. Venous Reflux:  None visualized. Other Findings:  None visualized. IMPRESSION: 1. Positive for occlusive deep venous thrombosis involving 1 of the paired brachial veins in the upper arm and extending into the peripheral axillary vein. 2. Positive for superficial thrombophlebitis involving the basilic vein in the antecubital fossa and upper arm. These results will be called to the ordering clinician or representative by the Radiologist  Assistant, and communication documented in the PACS or zVision Dashboard. Electronically Signed   By: Jacqulynn Cadet M.D.   On: 09/13/2017 17:00    ASSESSMENT AND PLAN: This is a very pleasant 72 years old African-American female with metastatic renal cell carcinoma that was initially diagnosed in 2010 and now presenting with bilateral pulmonary nodules. She was treated with stereotactic radiotherapy to hypermetabolic nodules without tissue diagnosis in the past. Recent imaging studies showed hypermetabolic and enlarging bilateral pulmonary nodules. CT-guided core biopsy of right pulmonary nodule was consistent with metastatic renal cell carcinoma. The patient was treated with a combination of ipilimumab and nivolumab for 3 cycles but ipilimumab was discontinued secondary to significant itching that was intolerable for the patient.  She  was treated with 2 more cycles of single agent nivolumab which was discontinued secondary to disease progression. She is currently undergoing treatment with oral Cabometyx 40 mg p.o. daily and has been tolerating this treatment fairly well.  She is status post 3 months of treatment. The patient has been tolerating this treatment well with no concerning complaints. She had repeat CT scan of the chest, abdomen and pelvis performed recently. I personally and independently reviewed the scans and discussed the results with the patient and her husband.  Her scan showed no concerning findings for disease progression. I recommended for the patient to continue her current treatment with Cabometyx with the same dose. For the deep venous thrombosis, she will continue her current treatment with Xarelto. For the small abscess in the buttock area, she will start treatment with Keflex 500 mg p.o. 4 times daily for 5 days.  And I sent prescription to her pharmacy.  She will also continue to apply antibiotics cream to this area. She will come back for follow-up visit in 1 month for  evaluation and repeat blood work. The patient was advised to call immediately if she has any concerning symptoms in the interval. The patient voices understanding of current disease status and treatment options and is in agreement with the current care plan. All questions were answered. The patient knows to call the clinic with any problems, questions or concerns. We can certainly see the patient much sooner if necessary.  Disclaimer: This note was dictated with voice recognition software. Similar sounding words can inadvertently be transcribed and may not be corrected upon review.

## 2017-10-10 ENCOUNTER — Telehealth: Payer: Self-pay | Admitting: *Deleted

## 2017-10-10 NOTE — Telephone Encounter (Signed)
Diarrhea day 3 Cabometyx- watery stool started on day 3 of Cabometyx restart. ( held 3 days last week). Just one episode on Tuesday and this am . Per Julien Nordmann I instructed pt to hold Cabometyx and f/u in 1 week. To call for persistent diarrhea. Schedule request sent.

## 2017-10-10 NOTE — Telephone Encounter (Signed)
"  [place] to notify Dr. Julien Nordmann I started the cancer pill again Saturday, 10-06-2017.  Every time  I take a pill, I have a brown diarrhea stool twenty minutes later.  Today I used Imodium AD a friend recommended who is a [employer] scientist.  Only one stool daily.  No blood in stool.  No stomach pain or cramps.  I take it on an empty stomach."

## 2017-10-11 ENCOUNTER — Telehealth: Payer: Self-pay | Admitting: Internal Medicine

## 2017-10-11 NOTE — Telephone Encounter (Signed)
Scheduled appt per 7/24 sch message - pt is aware of appt date and time.

## 2017-10-16 ENCOUNTER — Encounter: Payer: Self-pay | Admitting: Internal Medicine

## 2017-10-16 ENCOUNTER — Inpatient Hospital Stay (HOSPITAL_BASED_OUTPATIENT_CLINIC_OR_DEPARTMENT_OTHER): Payer: Medicare Other | Admitting: Internal Medicine

## 2017-10-16 ENCOUNTER — Inpatient Hospital Stay: Payer: Medicare Other

## 2017-10-16 VITALS — BP 107/58 | HR 94 | Temp 97.8°F | Resp 20 | Ht 65.0 in | Wt 150.5 lb

## 2017-10-16 DIAGNOSIS — C641 Malignant neoplasm of right kidney, except renal pelvis: Secondary | ICD-10-CM | POA: Diagnosis not present

## 2017-10-16 DIAGNOSIS — I82622 Acute embolism and thrombosis of deep veins of left upper extremity: Secondary | ICD-10-CM | POA: Diagnosis not present

## 2017-10-16 DIAGNOSIS — L0231 Cutaneous abscess of buttock: Secondary | ICD-10-CM | POA: Diagnosis not present

## 2017-10-16 DIAGNOSIS — C7801 Secondary malignant neoplasm of right lung: Secondary | ICD-10-CM

## 2017-10-16 DIAGNOSIS — R21 Rash and other nonspecific skin eruption: Secondary | ICD-10-CM

## 2017-10-16 DIAGNOSIS — R197 Diarrhea, unspecified: Secondary | ICD-10-CM

## 2017-10-16 DIAGNOSIS — I10 Essential (primary) hypertension: Secondary | ICD-10-CM

## 2017-10-16 DIAGNOSIS — Z5111 Encounter for antineoplastic chemotherapy: Secondary | ICD-10-CM

## 2017-10-16 LAB — CBC WITH DIFFERENTIAL (CANCER CENTER ONLY)
BASOS ABS: 0.1 10*3/uL (ref 0.0–0.1)
BASOS PCT: 1 %
Eosinophils Absolute: 0.4 10*3/uL (ref 0.0–0.5)
Eosinophils Relative: 5 %
HCT: 29 % — ABNORMAL LOW (ref 34.8–46.6)
HEMOGLOBIN: 9.3 g/dL — AB (ref 11.6–15.9)
LYMPHS PCT: 18 %
Lymphs Abs: 1.4 10*3/uL (ref 0.9–3.3)
MCH: 34.7 pg — ABNORMAL HIGH (ref 25.1–34.0)
MCHC: 32.1 g/dL (ref 31.5–36.0)
MCV: 107.9 fL — AB (ref 79.5–101.0)
Monocytes Absolute: 0.4 10*3/uL (ref 0.1–0.9)
Monocytes Relative: 5 %
NEUTROS ABS: 5.9 10*3/uL (ref 1.5–6.5)
NEUTROS PCT: 71 %
Platelet Count: 242 10*3/uL (ref 145–400)
RBC: 2.68 MIL/uL — ABNORMAL LOW (ref 3.70–5.45)
RDW: 18 % — AB (ref 11.2–14.5)
WBC Count: 8.2 10*3/uL (ref 3.9–10.3)

## 2017-10-16 LAB — CMP (CANCER CENTER ONLY)
ALBUMIN: 3.2 g/dL — AB (ref 3.5–5.0)
ALT: 15 U/L (ref 0–44)
ANION GAP: 10 (ref 5–15)
AST: 13 U/L — ABNORMAL LOW (ref 15–41)
Alkaline Phosphatase: 67 U/L (ref 38–126)
BILIRUBIN TOTAL: 0.3 mg/dL (ref 0.3–1.2)
BUN: 12 mg/dL (ref 8–23)
CALCIUM: 8.7 mg/dL — AB (ref 8.9–10.3)
CO2: 31 mmol/L (ref 22–32)
Chloride: 102 mmol/L (ref 98–111)
Creatinine: 0.98 mg/dL (ref 0.44–1.00)
GFR, Estimated: 57 mL/min — ABNORMAL LOW (ref >60)
Glucose, Bld: 121 mg/dL — ABNORMAL HIGH (ref 70–99)
POTASSIUM: 3.1 mmol/L — AB (ref 3.5–5.1)
SODIUM: 143 mmol/L (ref 135–145)
TOTAL PROTEIN: 6.2 g/dL — AB (ref 6.5–8.1)

## 2017-10-16 MED ORDER — HYDROCORTISONE 1 % EX CREA: 1.0000 "application " | TOPICAL_CREAM | Freq: Two times a day (BID) | CUTANEOUS | 0 refills | Status: DC

## 2017-10-16 NOTE — Progress Notes (Signed)
[place] [phone]   [phone]  OFFICE PROGRESS NOTE  [name], MD [address] [place] [address]  DIAGNOSIS:  1) Metastatic renal cell carcinoma, clear cell type, WHO nuclear grade 3 initially diagnosed in September 2010 and recently presented with bilateral pulmonary nodules. 2) history of renal cell carcinoma status post right nephrectomy in September 2010.  PRIOR THERAPY: 1)Stereotactic radiotherapy to pulmonary nodules based on the metabolic activity with no tissue diagnosis. This was performed under the care of Dr. Pablo Ledger. 2)first line treatment with immunotherapy with Nivolumab 3 MG/KG and Ipilumumab 1 mg/Kg every 3 weeks for 4 cycles followed by maintenance Nivolumab every 3 weeks. First dose 12/27/2016. Status post 3cycles.Stopped due to severe itching. 3) Nivolumab480 mg IV every 4 weeksas a single agentstartingon 05/03/2017.Status post2cycles.  Discontinued secondary to disease progression.  CURRENT THERAPY: Cabometyx 40 mg daily.  First dose started on 07/04/2017.  INTERVAL HISTORY: Melanie Best 72 y.o. female returns to the clinic today for follow-up visit accompanied by her husband.  The patient is feeling much better today.  She had 3 days of diarrhea after she resumed her treatment with Cabometyx.  She was also on treatment with Keflex at that time.  She took Imodium with improvement of the diarrhea but she does not take it at regular basis because of concern about constipation.  She also mentioned that the abscess on the buttock area has improved with the antibiotics.  She denied having any current chest pain, shortness breath, cough or hemoptysis.  She denied having any fever or chills.  She has no nausea, vomiting, diarrhea or constipation.  She developed an area of skin rash on the dorsum of her right foot.  She is here today for evaluation and discussion of her condition.  MEDICAL  HISTORY: Past Medical History:  Diagnosis Date  . Anemia   . Anxiety   . Arthritis    R leg  . Cancer ([place])    kidney  . Chronic fatigue 12/13/2016  . CKD (chronic kidney disease), stage II    removed R kidney- 2010- for Cancer, followed by Dr. Diona Fanti  . COPD (chronic obstructive pulmonary disease) ([facility])    uses O2-2 liters , continuously , followed by Dr. Chase Caller  . Cough 08-02-2015  . Diabetes mellitus, type 2 ([place])   . DM (diabetes mellitus) ([place]) 01/27/2012  . DVT (deep venous thrombosis), left    2011- treated /w coumadin   . Dyslipidemia   . Dysrhythmia   . Esophageal reflux   . GERD (gastroesophageal reflux disease) 01/27/2012  . Glaucoma   . Goals of care, counseling/discussion 12/13/2016  . Hemoptysis 08-02-15  . History of radiation therapy 01/09/12,01/11/12,01/15/12,01/17/12,& 01/19/12   rul lung,50Gy/66fx  . HTN (hypertension) 01/27/2012  . Hyperlipidemia 01/27/2012  . Hypertension    sees Dr. Deforest Hoyles for PCP, denies ever having stress or ECHO, ?when & where she had  last  EKG  . Leukocytosis   . Lung cancer ([place])    NSCLC  . Osteopenia   . Renal cell carcinoma of right kidney ([place]) 12/13/2016  . Shortness of breath   . Syncope   . Tremor   . Urinary incontinence   . Vitamin D deficiency     ALLERGIES:  has No Known Allergies.  MEDICATIONS:  Current Outpatient Medications  Medication Sig Dispense Refill  . bimatoprost (LUMIGAN) 0.01 % SOLN Place 1 drop into both eyes at bedtime.     Marland Kitchen  brinzolamide (AZOPT) 1 % ophthalmic suspension Place 1 drop into both eyes 3 (three) times daily. Reported on 08/12/2015    . CABOMETYX 40 MG tablet TAKE 1 TABLET BY MOUTH ONCE DAILY. TAKE ON AN EMPTY STOMACH 1 HOUR BEFORE OR 2 HOURS AFTER MEALS 30 tablet 1  . CALCIUM PO Take by mouth daily.    . cephALEXin (KEFLEX) 500 MG capsule Take 1 capsule (500 mg total) by mouth 4 (four) times daily. 20 capsule 0  . clonazePAM (KLONOPIN) 1 MG tablet Take 1 mg by mouth 2 (two) times daily  as needed. For anxiety    . diltiazem (CARDIZEM CD) 180 MG 24 hr capsule Take 180 mg by mouth daily.  0  . ergocalciferol (VITAMIN D2) 50000 UNITS capsule Take 50,000 Units by mouth once a week. Take on Monday    . esomeprazole (NEXIUM) 40 MG capsule Take 40 mg by mouth daily before breakfast.     . furosemide (LASIX) 20 MG tablet Take 20 mg by mouth daily as needed. For fluid retention    . gabapentin (NEURONTIN) 100 MG capsule Take 300 mg by mouth daily.  0  . ipratropium-albuterol (DUONEB) 0.5-2.5 (3) MG/3ML SOLN Take 3 mLs by nebulization 4 (four) times daily as needed. 360 mL 4  . JANUVIA 50 MG tablet Take 50 mg by mouth daily.     Marland Kitchen lidocaine-prilocaine (EMLA) cream Apply 1 application topically as needed.    . lovastatin (MEVACOR) 20 MG tablet Take 20 mg by mouth at bedtime.    . methocarbamol (ROBAXIN) 750 MG tablet Take 750 mg by mouth 2 (two) times daily as needed.  0  . OXYGEN Inhale 2 L into the lungs continuous.    . predniSONE (DELTASONE) 5 MG tablet Take 4 mg by mouth daily with breakfast.     . primidone (MYSOLINE) 50 MG tablet Take 100 mg by mouth 2 (two) times daily.     . promethazine (PHENERGAN) 25 MG tablet take 1 tablet by mouth every 6 hours if needed for nausea  0  . Rivaroxaban (XARELTO PO) Take by mouth.    . traMADol (ULTRAM) 50 MG tablet Take 1 tablet by mouth 3 (three) times daily as needed.  1   No current facility-administered medications for this visit.    Facility-Administered Medications Ordered in Other Visits  Medication Dose Route Frequency Provider Last Rate Last Dose  . sodium chloride flush (NS) 0.9 % injection 10 mL  10 mL Intravenous PRN Curt Bears, MD        SURGICAL HISTORY:  Past Surgical History:  Procedure Laterality Date  . BACK SURGERY     for pinched nerve, 2011, here at [facility]  . EYE SURGERY     cataracts removed, ?IOL  . FLEXIBLE BRONCHOSCOPY  11/29/2011  . IR FLUORO GUIDE PORT INSERTION RIGHT  01/30/2017  . IR US GUIDE VASC  ACCESS RIGHT  01/30/2017  . removed blood clot     left upper abd quad   . right kidney removed    . right nephrectomy      REVIEW OF SYSTEMS:  A comprehensive review of systems was negative except for: Constitutional: positive for fatigue Gastrointestinal: positive for diarrhea Musculoskeletal: positive for arthralgias   PHYSICAL EXAMINATION: General appearance: alert, cooperative, fatigued and no distress Head: Normocephalic, without obvious abnormality, atraumatic Neck: no adenopathy, no JVD, supple, symmetrical, trachea midline and thyroid not enlarged, symmetric, no tenderness/mass/nodules Lymph nodes: Cervical, supraclavicular, and axillary nodes normal. Resp: clear to  auscultation bilaterally Back: symmetric, no curvature. ROM normal. No CVA tenderness. Cardio: regular rate and rhythm, S1, S2 normal, no murmur, click, rub or gallop GI: soft, non-tender; bowel sounds normal; no masses,  no organomegaly Extremities: extremities normal, atraumatic, no cyanosis or edema  ECOG PERFORMANCE STATUS: 1 - Symptomatic but completely ambulatory  Blood pressure (!) 107/58, pulse 94, temperature 97.8 F (36.6 C), temperature source Oral, resp. rate 20, height 5\' 5"  (1.651 m), weight 150 lb 8 oz (68.3 kg), SpO2 98 %.  LABORATORY DATA: Lab Results  Component Value Date   WBC 8.2 10/16/2017   HGB 9.3 (L) 10/16/2017   HCT 29.0 (L) 10/16/2017   MCV 107.9 (H) 10/16/2017   PLT 242 10/16/2017      Chemistry      Component Value Date/Time   NA 144 10/01/2017 1340   NA 141 03/08/2017 1258   K 3.5 10/01/2017 1340   K 3.7 03/08/2017 1258   CL 105 10/01/2017 1340   CO2 30 10/01/2017 1340   CO2 23 03/08/2017 1258   BUN 8 10/01/2017 1340   BUN 24.1 03/08/2017 1258   CREATININE 1.03 (H) 10/01/2017 1340   CREATININE 1.05 09/10/2017 1322   CREATININE 1.4 (H) 03/08/2017 1258      Component Value Date/Time   CALCIUM 9.1 10/01/2017 1340   CALCIUM 8.8 03/08/2017 1258   ALKPHOS 91 10/01/2017  1340   ALKPHOS 65 03/08/2017 1258   AST 20 10/01/2017 1340   AST 27 09/10/2017 1322   AST 8 03/08/2017 1258   ALT 25 10/01/2017 1340   ALT 36 09/10/2017 1322   ALT 20 03/08/2017 1258   BILITOT <0.2 (L) 10/01/2017 1340   BILITOT <0.2 (L) 09/10/2017 1322   BILITOT 0.40 03/08/2017 1258       RADIOGRAPHIC STUDIES: Ct Abdomen Pelvis W Wo Contrast  Result Date: 10/01/2017 CLINICAL DATA:  Metastatic renal cell carcinoma. Post RIGHT nephrectomy. Trials immunotherapy altered for adverse reaction disease progression EXAM: CT CHEST WITH CONTRAST CT ABDOMEN AND PELVIS WITH AND WITHOUT CONTRAST TECHNIQUE: Multidetector CT imaging of the chest was performed during intravenous contrast administration. Multidetector CT imaging of the abdomen and pelvis was performed following the standard protocol before and during bolus administration of intravenous contrast. CONTRAST:  147mL ISOVUE-300 IOPAMIDOL (ISOVUE-300) INJECTION 61% COMPARISON:  CT 06/26/2017 FINDINGS: CT CHEST FINDINGS Cardiovascular: Port in the anterior chest wall with tip in distal SVC. Coronary artery calcification and aortic atherosclerotic calcification. Mediastinum/Nodes: No axillary supraclavicular adenopathy. No mediastinal hilar adenopathy. No pericardial fluid. Esophagus normal. Lungs/Pleura: LEFT lower lobe nodule adjacent to the pulmonary vessels measures 10 mm (image 89/12) compared with 10 mm. No new nodularity. Two small subpleural nodules in the LEFT upper lobe measuring 3 mm (image 15/12) are not changed. In the RIGHT upper lobe band of pleural-parenchymal thickening is unchanged measuring 20 mm by 10 mm. No new pulmonary nodules. Musculoskeletal: No aggressive osseous lesion CT ABDOMEN AND PELVIS FINDINGS Hepatobiliary: No focal hepatic lesion. No biliary duct dilatation. Gallbladder is normal. Common bile duct is normal. Pancreas: Pancreas is normal. No ductal dilatation. No pancreatic inflammation. Spleen: Normal spleen  Adrenals/urinary tract: Adrenal glands normal. Irregular enhancing mass in the lower pole of the LEFT kidney measures 44 by 45 mm compared to 62 x 16 mm. Lesion has a slightly more cystic appearance compared to prior. There decreased enhancement of the tumor thrombus within the LEFT renal vein compared to prior. The vein is distended to 17 mm compared to 21 mm on prior (  image 35/6). The lower pole LEFT renal mass is contained within the pararenal fascia. The mass abuts the LEFT psoas muscle but there is no evidence of direct invasion. No retroperitoneal lymphadenopathy. Post RIGHT nephrectomy. Stomach/Bowel: Stomach, small bowel, appendix, and cecum are normal. The colon and rectosigmoid colon are normal. Vascular/Lymphatic: Abdominal aorta is normal caliber. No periportal or retroperitoneal adenopathy. No pelvic adenopathy. Reproductive: Uterus normal. Other: No free fluid. Musculoskeletal: A 7 mm subtle sclerotic lesion in the L1 vertebral body (image 97/11 and image 60/15). Lesion not changed in size but slightly more dense compared to CT 06/26/2017 and 03/02/2017. IMPRESSION: Chest Impression: 1. Stable rounded perivascular nodules in the LEFT lower lobe. 2. Stable band like density in the RIGHT upper lobe. 3. No evidence of disease progression in thorax. Abdomen / Pelvis Impression: 1. Decrease in volume of renal mass in the lower pole LEFT kidney. 2. Decrease in size and enhancement of LEFT renal vein tumor thrombus. 3. No retroperitoneal lymphadenopathy. 4. RIGHT nephrectomy noted. 5. Sclerotic lesion in the L1 vertebral body is slightly more prominent but not changed in size from comparison exams. Recommend attention on follow-up. Electronically Signed   By: Suzy Bouchard M.D.   On: 10/01/2017 15:50   Ct Chest W Contrast  Result Date: 10/01/2017 CLINICAL DATA:  Metastatic renal cell carcinoma. Post RIGHT nephrectomy. Trials immunotherapy altered for adverse reaction disease progression EXAM: CT CHEST  WITH CONTRAST CT ABDOMEN AND PELVIS WITH AND WITHOUT CONTRAST TECHNIQUE: Multidetector CT imaging of the chest was performed during intravenous contrast administration. Multidetector CT imaging of the abdomen and pelvis was performed following the standard protocol before and during bolus administration of intravenous contrast. CONTRAST:  171mL ISOVUE-300 IOPAMIDOL (ISOVUE-300) INJECTION 61% COMPARISON:  CT 06/26/2017 FINDINGS: CT CHEST FINDINGS Cardiovascular: Port in the anterior chest wall with tip in distal SVC. Coronary artery calcification and aortic atherosclerotic calcification. Mediastinum/Nodes: No axillary supraclavicular adenopathy. No mediastinal hilar adenopathy. No pericardial fluid. Esophagus normal. Lungs/Pleura: LEFT lower lobe nodule adjacent to the pulmonary vessels measures 10 mm (image 89/12) compared with 10 mm. No new nodularity. Two small subpleural nodules in the LEFT upper lobe measuring 3 mm (image 15/12) are not changed. In the RIGHT upper lobe band of pleural-parenchymal thickening is unchanged measuring 20 mm by 10 mm. No new pulmonary nodules. Musculoskeletal: No aggressive osseous lesion CT ABDOMEN AND PELVIS FINDINGS Hepatobiliary: No focal hepatic lesion. No biliary duct dilatation. Gallbladder is normal. Common bile duct is normal. Pancreas: Pancreas is normal. No ductal dilatation. No pancreatic inflammation. Spleen: Normal spleen Adrenals/urinary tract: Adrenal glands normal. Irregular enhancing mass in the lower pole of the LEFT kidney measures 44 by 45 mm compared to 62 x 16 mm. Lesion has a slightly more cystic appearance compared to prior. There decreased enhancement of the tumor thrombus within the LEFT renal vein compared to prior. The vein is distended to 17 mm compared to 21 mm on prior (image 35/6). The lower pole LEFT renal mass is contained within the pararenal fascia. The mass abuts the LEFT psoas muscle but there is no evidence of direct invasion. No retroperitoneal  lymphadenopathy. Post RIGHT nephrectomy. Stomach/Bowel: Stomach, small bowel, appendix, and cecum are normal. The colon and rectosigmoid colon are normal. Vascular/Lymphatic: Abdominal aorta is normal caliber. No periportal or retroperitoneal adenopathy. No pelvic adenopathy. Reproductive: Uterus normal. Other: No free fluid. Musculoskeletal: A 7 mm subtle sclerotic lesion in the L1 vertebral body (image 97/11 and image 60/15). Lesion not changed in size but slightly more dense  compared to CT 06/26/2017 and 03/02/2017. IMPRESSION: Chest Impression: 1. Stable rounded perivascular nodules in the LEFT lower lobe. 2. Stable band like density in the RIGHT upper lobe. 3. No evidence of disease progression in thorax. Abdomen / Pelvis Impression: 1. Decrease in volume of renal mass in the lower pole LEFT kidney. 2. Decrease in size and enhancement of LEFT renal vein tumor thrombus. 3. No retroperitoneal lymphadenopathy. 4. RIGHT nephrectomy noted. 5. Sclerotic lesion in the L1 vertebral body is slightly more prominent but not changed in size from comparison exams. Recommend attention on follow-up. Electronically Signed   By: Suzy Bouchard M.D.   On: 10/01/2017 15:50    ASSESSMENT AND PLAN: This is a very pleasant 72 years old African-American female with metastatic renal cell carcinoma that was initially diagnosed in 2010 and now presenting with bilateral pulmonary nodules. She was treated with stereotactic radiotherapy to hypermetabolic nodules without tissue diagnosis in the past. Recent imaging studies showed hypermetabolic and enlarging bilateral pulmonary nodules. CT-guided core biopsy of right pulmonary nodule was consistent with metastatic renal cell carcinoma. The patient was treated with a combination of ipilimumab and nivolumab for 3 cycles but ipilimumab was discontinued secondary to significant itching that was intolerable for the patient.  She was treated with 2 more cycles of single agent nivolumab  which was discontinued secondary to disease progression. She is currently undergoing treatment with oral Cabometyx 40 mg p.o. daily and has been tolerating this treatment fairly well.  She is status post 3 months of treatment. She is tolerating this treatment well except for few episodes of diarrhea.  I recommended for her to continue her current treatment with Cabometyx.  She will also use Imodium on as-needed basis. For the skin rash I started the patient on hydrocortisone cream 1%. For the deep venous thrombosis, she will continue her current treatment with Xarelto. She will come back for follow-up visit in 3 weeks as previously scheduled. The patient was advised to call immediately if she has any concerning symptoms in the interval. The patient voices understanding of current disease status and treatment options and is in agreement with the current care plan. All questions were answered. The patient knows to call the clinic with any problems, questions or concerns. We can certainly see the patient much sooner if necessary.  Disclaimer: This note was dictated with voice recognition software. Similar sounding words can inadvertently be transcribed and may not be corrected upon review.

## 2017-11-05 ENCOUNTER — Other Ambulatory Visit: Payer: Self-pay | Admitting: Internal Medicine

## 2017-11-05 DIAGNOSIS — C641 Malignant neoplasm of right kidney, except renal pelvis: Secondary | ICD-10-CM

## 2017-11-08 MED FILL — CABOMETYX 40 MG TABLET: 40 | 30 days supply | Qty: 30 | Fill #0

## 2017-11-12 ENCOUNTER — Inpatient Hospital Stay: Payer: Medicare Other | Attending: Internal Medicine

## 2017-11-12 ENCOUNTER — Inpatient Hospital Stay (HOSPITAL_BASED_OUTPATIENT_CLINIC_OR_DEPARTMENT_OTHER): Payer: Medicare Other | Admitting: Internal Medicine

## 2017-11-12 ENCOUNTER — Telehealth: Payer: Self-pay | Admitting: Internal Medicine

## 2017-11-12 ENCOUNTER — Encounter: Payer: Self-pay | Admitting: Internal Medicine

## 2017-11-12 VITALS — BP 120/62 | HR 92 | Temp 98.7°F | Resp 16 | Ht 65.0 in | Wt 146.5 lb

## 2017-11-12 DIAGNOSIS — C641 Malignant neoplasm of right kidney, except renal pelvis: Secondary | ICD-10-CM | POA: Diagnosis present

## 2017-11-12 DIAGNOSIS — C7801 Secondary malignant neoplasm of right lung: Secondary | ICD-10-CM | POA: Diagnosis not present

## 2017-11-12 DIAGNOSIS — Z7901 Long term (current) use of anticoagulants: Secondary | ICD-10-CM | POA: Diagnosis not present

## 2017-11-12 DIAGNOSIS — R21 Rash and other nonspecific skin eruption: Secondary | ICD-10-CM | POA: Insufficient documentation

## 2017-11-12 DIAGNOSIS — Z5111 Encounter for antineoplastic chemotherapy: Secondary | ICD-10-CM

## 2017-11-12 DIAGNOSIS — I82409 Acute embolism and thrombosis of unspecified deep veins of unspecified lower extremity: Secondary | ICD-10-CM | POA: Insufficient documentation

## 2017-11-12 DIAGNOSIS — E876 Hypokalemia: Secondary | ICD-10-CM

## 2017-11-12 DIAGNOSIS — R110 Nausea: Secondary | ICD-10-CM

## 2017-11-12 DIAGNOSIS — C3432 Malignant neoplasm of lower lobe, left bronchus or lung: Secondary | ICD-10-CM

## 2017-11-12 DIAGNOSIS — R197 Diarrhea, unspecified: Secondary | ICD-10-CM

## 2017-11-12 DIAGNOSIS — Z95828 Presence of other vascular implants and grafts: Secondary | ICD-10-CM

## 2017-11-12 DIAGNOSIS — I10 Essential (primary) hypertension: Secondary | ICD-10-CM

## 2017-11-12 LAB — CBC WITH DIFFERENTIAL (CANCER CENTER ONLY)
Basophils Absolute: 0 10*3/uL (ref 0.0–0.1)
Basophils Relative: 0 %
Eosinophils Absolute: 0.4 10*3/uL (ref 0.0–0.5)
Eosinophils Relative: 4 %
HCT: 31.6 % — ABNORMAL LOW (ref 34.8–46.6)
Hemoglobin: 9.6 g/dL — ABNORMAL LOW (ref 11.6–15.9)
LYMPHS ABS: 4.2 10*3/uL — AB (ref 0.9–3.3)
LYMPHS PCT: 44 %
MCH: 34.2 pg — AB (ref 25.1–34.0)
MCHC: 30.4 g/dL — AB (ref 31.5–36.0)
MCV: 112.5 fL — AB (ref 79.5–101.0)
MONOS PCT: 4 %
Monocytes Absolute: 0.3 10*3/uL (ref 0.1–0.9)
Neutro Abs: 4.6 10*3/uL (ref 1.5–6.5)
Neutrophils Relative %: 48 %
PLATELETS: 215 10*3/uL (ref 145–400)
RBC: 2.81 MIL/uL — AB (ref 3.70–5.45)
RDW: 15.2 % — ABNORMAL HIGH (ref 11.2–14.5)
WBC Count: 9.5 10*3/uL (ref 3.9–10.3)

## 2017-11-12 LAB — CMP (CANCER CENTER ONLY)
ALBUMIN: 3.2 g/dL — AB (ref 3.5–5.0)
ALT: 22 U/L (ref 0–44)
AST: 14 U/L — AB (ref 15–41)
Alkaline Phosphatase: 78 U/L (ref 38–126)
Anion gap: 11 (ref 5–15)
BUN: 12 mg/dL (ref 8–23)
CHLORIDE: 105 mmol/L (ref 98–111)
CO2: 30 mmol/L (ref 22–32)
CREATININE: 0.92 mg/dL (ref 0.44–1.00)
Calcium: 8.5 mg/dL — ABNORMAL LOW (ref 8.9–10.3)
GFR, Est AFR Am: >60 mL/min (ref >60)
GFR, Estimated: >60 mL/min (ref >60)
GLUCOSE: 113 mg/dL — AB (ref 70–99)
POTASSIUM: 2.9 mmol/L — AB (ref 3.5–5.1)
SODIUM: 146 mmol/L — AB (ref 135–145)
Total Bilirubin: 0.2 mg/dL — ABNORMAL LOW (ref 0.3–1.2)
Total Protein: 6.2 g/dL — ABNORMAL LOW (ref 6.5–8.1)

## 2017-11-12 MED ORDER — POTASSIUM CHLORIDE ER 20 MEQ PO TBCR: 20.0000 meq | EXTENDED_RELEASE_TABLET | Freq: Two times a day (BID) | ORAL | 0 refills | Status: DC

## 2017-11-12 MED ORDER — HEPARIN SOD (PORK) LOCK FLUSH 100 UNIT/ML IV SOLN
500.0000 [IU] | Freq: Once | INTRAVENOUS | Status: AC | PRN
  Administered 2017-11-12: 500 [IU] via INTRAVENOUS
  Filled 2017-11-12: qty 5

## 2017-11-12 MED ORDER — SODIUM CHLORIDE 0.9% FLUSH
10.0000 mL | INTRAVENOUS | Status: DC | PRN
  Administered 2017-11-12: 10 mL via INTRAVENOUS
  Filled 2017-11-12: qty 10

## 2017-11-12 NOTE — Telephone Encounter (Signed)
Gave PT calendar of upcoming appts.

## 2017-11-12 NOTE — Progress Notes (Signed)
[facility] [facility] Telephone:([phone]   Fax:([phone]  OFFICE PROGRESS NOTE  [name], MD [address] [place] [address]  DIAGNOSIS:  1) Metastatic renal cell carcinoma, clear cell type, WHO nuclear grade 3 initially diagnosed in September 2010 and recently presented with bilateral pulmonary nodules. 2) history of renal cell carcinoma status post right nephrectomy in September 2010.  PRIOR THERAPY: 1)Stereotactic radiotherapy to pulmonary nodules based on the metabolic activity with no tissue diagnosis. This was performed under the care of Dr. Pablo Ledger. 2)first line treatment with immunotherapy with Nivolumab 3 MG/KG and Ipilumumab 1 mg/Kg every 3 weeks for 4 cycles followed by maintenance Nivolumab every 3 weeks. First dose 12/27/2016. Status post 3cycles.Stopped due to severe itching. 3) Nivolumab480 mg IV every 4 weeksas a single agentstartingon 05/03/2017.Status post2cycles.  Discontinued secondary to disease progression.  CURRENT THERAPY: Cabometyx 40 mg daily.  First dose started on 07/04/2017.  INTERVAL HISTORY: Melanie Best 72 y.o. female returns to the clinic today for follow-up visit accompanied by her husband.  The patient is feeling fine today with no specific complaints except for intermittent diarrhea and occasional nausea.  She is currently on the Imodium and Compazine.  She denied having any chest pain, shortness of breath, cough or hemoptysis.  She denied having any fever or chills.  She has no weight loss or night sweats.  The patient denied having any headache or visual changes.  She continues to tolerate her treatment with Cabometyx fairly well.  She is here today for evaluation and repeat blood work.  MEDICAL HISTORY: Past Medical History:  Diagnosis Date  . Anemia   . Anxiety   . Arthritis    R leg  . Cancer ([place])    kidney  . Chronic fatigue 12/13/2016  . CKD (chronic kidney disease), stage II    removed R kidney- 2010- for Cancer, followed by Dr. Diona Fanti  . COPD (chronic obstructive pulmonary disease) ([facility])    uses O2-2 liters , continuously , followed by Dr. Chase Caller  . Cough 08-02-2015  . Diabetes mellitus, type 2 ([place])   . DM (diabetes mellitus) ([place]) 01/27/2012  . DVT (deep venous thrombosis), left    2011- treated /w coumadin   . Dyslipidemia   . Dysrhythmia   . Esophageal reflux   . GERD (gastroesophageal reflux disease) 01/27/2012  . Glaucoma   . Goals of care, counseling/discussion 12/13/2016  . Hemoptysis 08-02-15  . History of radiation therapy 01/09/12,01/11/12,01/15/12,01/17/12,& 01/19/12   rul lung,50Gy/81fx  . HTN (hypertension) 01/27/2012  . Hyperlipidemia 01/27/2012  . Hypertension    sees Dr. Deforest Hoyles for PCP, denies ever having stress or ECHO, ?when & where she had  last  EKG  . Leukocytosis   . Lung cancer ([place])    NSCLC  . Osteopenia   . Renal cell carcinoma of right kidney ([place]) 12/13/2016  . Shortness of breath   . Syncope   . Tremor   . Urinary incontinence   . Vitamin D deficiency     ALLERGIES:  has No Known Allergies.  MEDICATIONS:  Current Outpatient Medications  Medication Sig Dispense Refill  . bimatoprost (LUMIGAN) 0.01 % SOLN Place 1 drop into both eyes at bedtime.     . brinzolamide (AZOPT) 1 % ophthalmic suspension Place 1 drop into both eyes 3 (three) times daily. Reported on 08/12/2015    . CABOMETYX 40 MG tablet TAKE 1 TABLET (40MG ) BY MOUTH ONCE DAILY. TAKE ON AN  EMPTY STOMACH 1 HOUR BEFORE OR 2 HOURS AFTER MEALS 30 tablet 1  . CALCIUM PO Take by mouth daily.    . clonazePAM (KLONOPIN) 1 MG tablet Take 1 mg by mouth 2 (two) times daily as needed. For anxiety    . ergocalciferol (VITAMIN D2) 50000 UNITS capsule Take 50,000 Units by mouth once a week. Take on Monday    . esomeprazole (NEXIUM) 40 MG capsule Take 40 mg by mouth daily before breakfast.     . hydrocortisone cream 1 % Apply 1 application topically 2 (two) times daily. 30 g 0    . ipratropium-albuterol (DUONEB) 0.5-2.5 (3) MG/3ML SOLN Take 3 mLs by nebulization 4 (four) times daily as needed. 360 mL 4  . JANUVIA 50 MG tablet Take 50 mg by mouth daily.     Marland Kitchen lidocaine-prilocaine (EMLA) cream Apply 1 application topically as needed.    . lovastatin (MEVACOR) 20 MG tablet Take 20 mg by mouth at bedtime.    . OXYGEN Inhale 2 L into the lungs continuous.    . predniSONE (DELTASONE) 5 MG tablet Take 4 mg by mouth daily with breakfast.     . primidone (MYSOLINE) 50 MG tablet Take 100 mg by mouth 2 (two) times daily.     . traMADol (ULTRAM) 50 MG tablet Take 1 tablet by mouth 3 (three) times daily as needed.  1  . diltiazem (CARDIZEM CD) 180 MG 24 hr capsule Take 180 mg by mouth daily.  0  . furosemide (LASIX) 20 MG tablet Take 20 mg by mouth daily as needed. For fluid retention    . methocarbamol (ROBAXIN) 750 MG tablet Take 750 mg by mouth 2 (two) times daily as needed.  0  . promethazine (PHENERGAN) 25 MG tablet take 1 tablet by mouth every 6 hours if needed for nausea  0  . XARELTO 20 MG TABS tablet Take 20 mg by mouth daily.  2   No current facility-administered medications for this visit.    Facility-Administered Medications Ordered in Other Visits  Medication Dose Route Frequency Provider Last Rate Last Dose  . sodium chloride flush (NS) 0.9 % injection 10 mL  10 mL Intravenous PRN Curt Bears, MD        SURGICAL HISTORY:  Past Surgical History:  Procedure Laterality Date  . BACK SURGERY     for pinched nerve, 2011, here at [facility]  . EYE SURGERY     cataracts removed, ?IOL  . FLEXIBLE BRONCHOSCOPY  11/29/2011  . IR FLUORO GUIDE PORT INSERTION RIGHT  01/30/2017  . IR US GUIDE VASC ACCESS RIGHT  01/30/2017  . removed blood clot     left upper abd quad   . right kidney removed    . right nephrectomy      REVIEW OF SYSTEMS:  A comprehensive review of systems was negative except for: Constitutional: positive for fatigue Gastrointestinal: positive for  diarrhea Musculoskeletal: positive for arthralgias   PHYSICAL EXAMINATION: General appearance: alert, cooperative, fatigued and no distress Head: Normocephalic, without obvious abnormality, atraumatic Neck: no adenopathy, no JVD, supple, symmetrical, trachea midline and thyroid not enlarged, symmetric, no tenderness/mass/nodules Lymph nodes: Cervical, supraclavicular, and axillary nodes normal. Resp: clear to auscultation bilaterally Back: symmetric, no curvature. ROM normal. No CVA tenderness. Cardio: regular rate and rhythm, S1, S2 normal, no murmur, click, rub or gallop GI: soft, non-tender; bowel sounds normal; no masses,  no organomegaly Extremities: extremities normal, atraumatic, no cyanosis or edema  ECOG PERFORMANCE STATUS: 1 - Symptomatic  but completely ambulatory  Blood pressure 120/62, pulse 92, temperature 98.7 F (37.1 C), temperature source Oral, resp. rate 16, height 5\' 5"  (1.651 m), weight 146 lb 8 oz (66.5 kg), SpO2 100 %.  LABORATORY DATA: Lab Results  Component Value Date   WBC 9.5 11/12/2017   HGB 9.6 (L) 11/12/2017   HCT 31.6 (L) 11/12/2017   MCV 112.5 (H) 11/12/2017   PLT 215 11/12/2017      Chemistry      Component Value Date/Time   NA 143 10/16/2017 1139   NA 141 03/08/2017 1258   K 3.1 (L) 10/16/2017 1139   K 3.7 03/08/2017 1258   CL 102 10/16/2017 1139   CO2 31 10/16/2017 1139   CO2 23 03/08/2017 1258   BUN 12 10/16/2017 1139   BUN 24.1 03/08/2017 1258   CREATININE 0.98 10/16/2017 1139   CREATININE 1.4 (H) 03/08/2017 1258      Component Value Date/Time   CALCIUM 8.7 (L) 10/16/2017 1139   CALCIUM 8.8 03/08/2017 1258   ALKPHOS 67 10/16/2017 1139   ALKPHOS 65 03/08/2017 1258   AST 13 (L) 10/16/2017 1139   AST 8 03/08/2017 1258   ALT 15 10/16/2017 1139   ALT 20 03/08/2017 1258   BILITOT 0.3 10/16/2017 1139   BILITOT 0.40 03/08/2017 1258       RADIOGRAPHIC STUDIES: No results found.  ASSESSMENT AND PLAN: This is a very pleasant 72  years old African-American female with metastatic renal cell carcinoma that was initially diagnosed in 2010 and now presenting with bilateral pulmonary nodules. She was treated with stereotactic radiotherapy to hypermetabolic nodules without tissue diagnosis in the past. Recent imaging studies showed hypermetabolic and enlarging bilateral pulmonary nodules. CT-guided core biopsy of right pulmonary nodule was consistent with metastatic renal cell carcinoma. The patient was treated with a combination of ipilimumab and nivolumab for 3 cycles but ipilimumab was discontinued secondary to significant itching that was intolerable for the patient.  She was treated with 2 more cycles of single agent nivolumab which was discontinued secondary to disease progression. She is currently undergoing treatment with oral Cabometyx 40 mg p.o. daily and has been tolerating this treatment fairly well.  She is status post 4 months of treatment. The patient continues to tolerate her treatment well with no concerning complaints except for mild nausea and diarrhea improved with Compazine and Imodium. I recommended for her to continue her current treatment with the same regimen. I will see her back for follow-up visit in 1 months for evaluation with repeat blood work. For the skin rash I started the patient on hydrocortisone cream 1%. For the deep venous thrombosis, she will continue her current treatment with Xarelto. For the hypokalemia, I will start the patient on potassium chloride 40 mEq p.o. daily for 10 days.  She will need to follow-up with her primary care physician because of her current treatment with Lasix. She will have Port-A-Cath flush every 6 weeks. She was advised to call immediately if she has any concerning symptoms in the interval. The patient voices understanding of current disease status and treatment options and is in agreement with the current care plan. All questions were answered. The patient knows to  call the clinic with any problems, questions or concerns. We can certainly see the patient much sooner if necessary.  Disclaimer: This note was dictated with voice recognition software. Similar sounding words can inadvertently be transcribed and may not be corrected upon review.

## 2017-11-12 NOTE — Addendum Note (Signed)
Addended by: Ardeen Garland on: 11/12/2017 10:20 AM   Modules accepted: Orders

## 2017-11-19 ENCOUNTER — Other Ambulatory Visit: Payer: Self-pay | Admitting: Internal Medicine

## 2017-11-22 ENCOUNTER — Telehealth: Payer: Self-pay | Admitting: *Deleted

## 2017-11-22 ENCOUNTER — Inpatient Hospital Stay: Payer: Medicare Other | Attending: Internal Medicine | Admitting: Medical

## 2017-11-22 ENCOUNTER — Inpatient Hospital Stay: Payer: Medicare Other

## 2017-11-22 VITALS — BP 126/80 | HR 96 | Temp 98.2°F | Resp 18

## 2017-11-22 DIAGNOSIS — Z95828 Presence of other vascular implants and grafts: Secondary | ICD-10-CM

## 2017-11-22 DIAGNOSIS — C641 Malignant neoplasm of right kidney, except renal pelvis: Secondary | ICD-10-CM | POA: Insufficient documentation

## 2017-11-22 DIAGNOSIS — E876 Hypokalemia: Secondary | ICD-10-CM | POA: Diagnosis not present

## 2017-11-22 DIAGNOSIS — R040 Epistaxis: Secondary | ICD-10-CM | POA: Insufficient documentation

## 2017-11-22 DIAGNOSIS — Z7901 Long term (current) use of anticoagulants: Secondary | ICD-10-CM | POA: Insufficient documentation

## 2017-11-22 DIAGNOSIS — R1032 Left lower quadrant pain: Secondary | ICD-10-CM | POA: Diagnosis not present

## 2017-11-22 DIAGNOSIS — I82409 Acute embolism and thrombosis of unspecified deep veins of unspecified lower extremity: Secondary | ICD-10-CM | POA: Diagnosis not present

## 2017-11-22 DIAGNOSIS — C7801 Secondary malignant neoplasm of right lung: Secondary | ICD-10-CM | POA: Insufficient documentation

## 2017-11-22 DIAGNOSIS — R600 Localized edema: Secondary | ICD-10-CM | POA: Diagnosis not present

## 2017-11-22 DIAGNOSIS — C3432 Malignant neoplasm of lower lobe, left bronchus or lung: Secondary | ICD-10-CM

## 2017-11-22 DIAGNOSIS — R21 Rash and other nonspecific skin eruption: Secondary | ICD-10-CM | POA: Diagnosis not present

## 2017-11-22 DIAGNOSIS — R0602 Shortness of breath: Secondary | ICD-10-CM | POA: Diagnosis not present

## 2017-11-22 DIAGNOSIS — C649 Malignant neoplasm of unspecified kidney, except renal pelvis: Secondary | ICD-10-CM | POA: Diagnosis not present

## 2017-11-22 DIAGNOSIS — N764 Abscess of vulva: Secondary | ICD-10-CM | POA: Diagnosis not present

## 2017-11-22 LAB — CMP (CANCER CENTER ONLY)
ALBUMIN: 3.5 g/dL (ref 3.5–5.0)
ALK PHOS: 94 U/L (ref 38–126)
ALT: 25 U/L (ref 0–44)
ANION GAP: 10 (ref 5–15)
AST: 18 U/L (ref 15–41)
BUN: 14 mg/dL (ref 8–23)
CALCIUM: 8.8 mg/dL — AB (ref 8.9–10.3)
CHLORIDE: 102 mmol/L (ref 98–111)
CO2: 28 mmol/L (ref 22–32)
CREATININE: 0.96 mg/dL (ref 0.44–1.00)
GFR, EST NON AFRICAN AMERICAN: 59 mL/min — AB (ref >60)
Glucose, Bld: 98 mg/dL (ref 70–99)
Potassium: 4 mmol/L (ref 3.5–5.1)
SODIUM: 140 mmol/L (ref 135–145)
Total Bilirubin: 0.3 mg/dL (ref 0.3–1.2)
Total Protein: 6.7 g/dL (ref 6.5–8.1)

## 2017-11-22 LAB — CBC WITH DIFFERENTIAL (CANCER CENTER ONLY)
BASOS PCT: 0 %
Basophils Absolute: 0 10*3/uL (ref 0.0–0.1)
EOS ABS: 0.2 10*3/uL (ref 0.0–0.5)
Eosinophils Relative: 2 %
HCT: 32.6 % — ABNORMAL LOW (ref 34.8–46.6)
HEMOGLOBIN: 10.5 g/dL — AB (ref 11.6–15.9)
Lymphocytes Relative: 11 %
Lymphs Abs: 0.9 10*3/uL (ref 0.9–3.3)
MCH: 35.3 pg — ABNORMAL HIGH (ref 25.1–34.0)
MCHC: 32.4 g/dL (ref 31.5–36.0)
MCV: 109.2 fL — ABNORMAL HIGH (ref 79.5–101.0)
Monocytes Absolute: 0.2 10*3/uL (ref 0.1–0.9)
Monocytes Relative: 3 %
NEUTROS ABS: 7.4 10*3/uL — AB (ref 1.5–6.5)
NEUTROS PCT: 84 %
Platelet Count: 212 10*3/uL (ref 145–400)
RBC: 2.98 MIL/uL — AB (ref 3.70–5.45)
RDW: 15.7 % — ABNORMAL HIGH (ref 11.2–14.5)
WBC: 8.7 10*3/uL (ref 3.9–10.3)

## 2017-11-22 MED ORDER — SODIUM CHLORIDE 0.9% FLUSH
10.0000 mL | INTRAVENOUS | Status: DC | PRN
  Administered 2017-11-22: 10 mL via INTRAVENOUS
  Filled 2017-11-22: qty 10

## 2017-11-22 MED ORDER — MUPIROCIN 2 % EX OINT: 1.0000 "application " | TOPICAL_OINTMENT | Freq: Two times a day (BID) | CUTANEOUS | 1 refills | Status: DC

## 2017-11-22 MED ORDER — HEPARIN SOD (PORK) LOCK FLUSH 100 UNIT/ML IV SOLN
500.0000 [IU] | Freq: Once | INTRAVENOUS | Status: AC | PRN
  Administered 2017-11-22: 500 [IU] via INTRAVENOUS
  Filled 2017-11-22: qty 5

## 2017-11-22 MED ORDER — CEPHALEXIN 500 MG PO CAPS: 500.0000 mg | ORAL_CAPSULE | Freq: Three times a day (TID) | ORAL | 0 refills | Status: DC

## 2017-11-22 MED ORDER — HEPARIN SOD (PORK) LOCK FLUSH 100 UNIT/ML IV SOLN
500.0000 [IU] | Freq: Once | INTRAVENOUS | Status: AC
  Administered 2017-11-22: 500 [IU] via INTRAVENOUS
  Filled 2017-11-22: qty 5

## 2017-11-22 NOTE — Patient Instructions (Signed)
Implanted Port Home Guide An implanted port is a type of central line that is placed under the skin. Central lines are used to provide IV access when treatment or nutrition needs to be given through a person's veins. Implanted ports are used for long-term IV access. An implanted port may be placed because:  You need IV medicine that would be irritating to the small veins in your hands or arms.  You need long-term IV medicines, such as antibiotics.  You need IV nutrition for a long period.  You need frequent blood draws for lab tests.  You need dialysis.  Implanted ports are usually placed in the chest area, but they can also be placed in the upper arm, the abdomen, or the leg. An implanted port has two main parts:  Reservoir. The reservoir is round and will appear as a small, raised area under your skin. The reservoir is the part where a needle is inserted to give medicines or draw blood.  Catheter. The catheter is a thin, flexible tube that extends from the reservoir. The catheter is placed into a large vein. Medicine that is inserted into the reservoir goes into the catheter and then into the vein.  How will I care for my incision site? Do not get the incision site wet. Bathe or shower as directed by your health care provider. How is my port accessed? Special steps must be taken to access the port:  Before the port is accessed, a numbing cream can be placed on the skin. This helps numb the skin over the port site.  Your health care provider uses a sterile technique to access the port. ? Your health care provider must put on a mask and sterile gloves. ? The skin over your port is cleaned carefully with an antiseptic and allowed to dry. ? The port is gently pinched between sterile gloves, and a needle is inserted into the port.  Only "non-coring" port needles should be used to access the port. Once the port is accessed, a blood return should be checked. This helps ensure that the port  is in the vein and is not clogged.  If your port needs to remain accessed for a constant infusion, a clear (transparent) bandage will be placed over the needle site. The bandage and needle will need to be changed every week, or as directed by your health care provider.  Keep the bandage covering the needle clean and dry. Do not get it wet. Follow your health care provider's instructions on how to take a shower or bath while the port is accessed.  If your port does not need to stay accessed, no bandage is needed over the port.  What is flushing? Flushing helps keep the port from getting clogged. Follow your health care provider's instructions on how and when to flush the port. Ports are usually flushed with saline solution or a medicine called heparin. The need for flushing will depend on how the port is used.  If the port is used for intermittent medicines or blood draws, the port will need to be flushed: ? After medicines have been given. ? After blood has been drawn. ? As part of routine maintenance.  If a constant infusion is running, the port may not need to be flushed.  How long will my port stay implanted? The port can stay in for as long as your health care provider thinks it is needed. When it is time for the port to come out, surgery will be   done to remove it. The procedure is similar to the one performed when the port was put in. When should I seek immediate medical care? When you have an implanted port, you should seek immediate medical care if:  You notice a bad smell coming from the incision site.  You have swelling, redness, or drainage at the incision site.  You have more swelling or pain at the port site or the surrounding area.  You have a fever that is not controlled with medicine.  This information is not intended to replace advice given to you by your health care provider. Make sure you discuss any questions you have with your health care provider. Document  Released: 03/06/2005 Document Revised: 08/12/2015 Document Reviewed: 11/11/2012 Elsevier Interactive Patient Education  2017 [employer]  

## 2017-11-22 NOTE — Telephone Encounter (Signed)
"  A sore in my nose won't heal.  THis weak I've  used antibiotic ointment.  The past week and a half I also have sores on my bottom between my legs that hurt like a blister.  There was a little blood .  He ordered an antibiotic before when this happened.  I use a humidifier with the oxygen at 2L.  I do not follow a GYN."

## 2017-11-22 NOTE — Progress Notes (Signed)
Pelvic exam performed by PA Lucianne Lei, supervised by Leeann Must.  No RN assessment at this time, PA Lucianne Lei only seeing pt.  PA aware.

## 2017-11-22 NOTE — Telephone Encounter (Signed)
Skin sores around nose and vaginal area x 1-2 weeks. On Cabometyx oral treatment. Per [facility] labs and [facility]. Pt notified.

## 2017-11-23 NOTE — Progress Notes (Signed)
[facility] Progress Note   Melanie Best [id number] 1945/06/22 72 y.o.  Melanie Best is managed by Dr. Fanny Bien. Melanie Best  Actively treated with chemotherapy/immunotherapy: yes  Current Therapy: Cabometyx 40 mg once daily  Assessment: Plan:    Left-sided nosebleed - Plan: mupirocin ointment (BACTROBAN) 2 %  Abscess of labia - Plan: cephALEXin (KEFLEX) 500 MG capsule  Bilateral lower extremity edema  Renal cell carcinoma, unspecified laterality ([place]) - Plan: sodium chloride flush (NS) 0.9 % injection 10 mL, heparin lock flush 100 unit/mL   Left-sided nosebleed: The patient's exam shows that her left inner nares has been abraded by her nasal cannula.  She was given a prescription for Bactroban ointment with directions to apply twice daily.  Suspected abscess of the bilateral inferior labia: The patient was given a prescription for Keflex 500 mg p.o. 3 times daily and was told to do warm soaks in the tub.  She was told that she may need to be referred to gynecology should this not improve.  Bilateral lower extremity edema: The patient was reassured that her calf sizes are bilaterally equal.  There is no evidence to suggest a DVT.  She continues on Xarelto.  Metastatic renal cell carcinoma: The patient continues on Cabometyx 40 mg once daily.  She will be seen in follow-up by Dr. Fanny Bien. Melanie Best on 12/24/2017.  Please see After Visit Summary for patient specific instructions.  Future Appointments  Date Time Provider [place]  12/24/2017  9:30 AM [facility] ONLY [facility] None  12/24/2017  9:45 AM [facility] [place] [facility] None  12/24/2017 10:15 AM Curt Bears, MD [facility] None  02/04/2018  1:00 PM [facility] [place] [facility] None  03/18/2018  1:00 PM [facility] [place] [facility] None  04/29/2018  1:00 PM [facility] [place] [facility] None  06/10/2018  1:00 PM [facility] [place] [facility] None  07/22/2018  1:00 PM [facility] [place] FL[facility]  [facility] None    No orders of the defined types were placed in this encounter.      Subjective:   Patient ID:  Melanie Best is a 72 y.o. (DOB 1945/03/29) female.  Chief Complaint: No chief complaint on file.   HPI [name] is a 72 year old female with a history of a metastatic renal cell carcinoma who is managed by Dr. Fanny Bien. Melanie Best and is currently treated with Cabometyx 40 mg once daily.  She presents to the office today with a report of bleeding and a sore in her left nares.  She continues on supplemental oxygen via nasal cannula.  She also reports having sores in the bilateral inferior labia.  She states that she has had something similar to this in the past that was treated and resolved after course of antibiotics.  She reports having mild diarrhea.  She denies fevers, chills, sweats, nausea, vomiting, or constipation.  She is scheduled to see Dr. Julien Nordmann in follow-up on 12/24/2017.  Medications: I have reviewed the patient's current medications.  Allergies: No Known Allergies  Past Medical History:  Diagnosis Date  . Anemia   . Anxiety   . Arthritis    R leg  . Cancer ([place])    kidney  . Chronic fatigue 12/13/2016  . CKD (chronic kidney disease), stage II    removed R kidney- 2010- for Cancer, followed by Dr. Diona Fanti  . COPD (chronic obstructive pulmonary disease) (HCC)    uses O2-2 liters , continuously , followed by Dr. Chase Caller  . Cough  08-02-2015  . Diabetes mellitus, type 2 ([place])   . DM (diabetes mellitus) ([place]) 01/27/2012  . DVT (deep venous thrombosis), left    2011- treated /w coumadin   . Dyslipidemia   . Dysrhythmia   . Esophageal reflux   . GERD (gastroesophageal reflux disease) 01/27/2012  . Glaucoma   . Goals of care, counseling/discussion 12/13/2016  . Hemoptysis 08-02-15  . History of radiation therapy 01/09/12,01/11/12,01/15/12,01/17/12,& 01/19/12   rul lung,50Gy/49fx  . HTN (hypertension) 01/27/2012  . Hyperlipidemia 01/27/2012  .  Hypertension    sees Dr. Deforest Hoyles for PCP, denies ever having stress or ECHO, ?when & where she had  last  EKG  . Leukocytosis   . Lung cancer ([place])    NSCLC  . Osteopenia   . Renal cell carcinoma of right kidney ([place]) 12/13/2016  . Shortness of breath   . Syncope   . Tremor   . Urinary incontinence   . Vitamin D deficiency     Past Surgical History:  Procedure Laterality Date  . BACK SURGERY     for pinched nerve, 2011, here at [facility]  . EYE SURGERY     cataracts removed, ?IOL  . FLEXIBLE BRONCHOSCOPY  11/29/2011  . IR FLUORO GUIDE PORT INSERTION RIGHT  01/30/2017  . IR US GUIDE VASC ACCESS RIGHT  01/30/2017  . removed blood clot     left upper abd quad   . right kidney removed    . right nephrectomy      Family History  Problem Relation Age of Onset  . Emphysema Father   . Cancer Sister        behind eye  . Cancer Brother        lung  . Cancer Brother        lung  . Cancer Brother        throat  . Breast cancer Neg Hx     Social History   Socioeconomic History  . Marital status: Married    Spouse name: Not on file  . Number of children: Not on file  . Years of education: Not on file  . Highest education level: Not on file  Occupational History  . Not on file  Social Needs  . Financial resource strain: Not on file  . Food insecurity:    Worry: Not on file    Inability: Not on file  . Transportation needs:    Medical: Not on file    Non-medical: Not on file  Tobacco Use  . Smoking status: Former Smoker    Packs/day: 1.00    Years: 35.00    Pack years: 35.00    Types: Cigarettes    Last attempt to quit: 03/20/2004    Years since quitting: 13.6  . Smokeless tobacco: Never Used  Substance and Sexual Activity  . Alcohol use: No  . Drug use: No  . Sexual activity: Not on file  Lifestyle  . Physical activity:    Days per week: Not on file    Minutes per session: Not on file  . Stress: Not on file  Relationships  . Social connections:    Talks on  phone: Not on file    Gets together: Not on file    Attends religious service: Not on file    Active member of club or organization: Not on file    Attends meetings of clubs or organizations: Not on file    Relationship status: Not on file  . Intimate partner  violence:    Fear of current or ex partner: Not on file    Emotionally abused: Not on file    Physically abused: Not on file    Forced sexual activity: Not on file  Other Topics Concern  . Not on file  Social History Narrative  . Not on file    Past Medical History, Surgical history, Social history, and Family history were reviewed and updated as appropriate.   Please see review of systems for further details on the patient's review from today.   Review of Systems:  Review of Systems  Constitutional: Negative for appetite change, chills, diaphoresis and fever.  HENT: Positive for nosebleeds.   Respiratory: Negative for cough, chest tightness and shortness of breath.   Cardiovascular: Negative for chest pain, palpitations and leg swelling.  Gastrointestinal: Positive for diarrhea. Negative for abdominal distention, abdominal pain, blood in stool, constipation, nausea and vomiting.  Genitourinary: Positive for vaginal pain (Bilateral inferior labial mass). Negative for decreased urine volume and difficulty urinating.  Neurological: Negative for weakness.    Objective:   Physical Exam:  BP 126/80   Pulse 96   Temp 98.2 F (36.8 C) (Oral)   Resp 18   SpO2 92%  ECOG: 1  Physical Exam  Constitutional: No distress.  Melanie Best is an elderly female who is receiving oxygen via nasal cannula and who appears to be in no acute distress.  HENT:  Head: Normocephalic and atraumatic.  Nose:    Mouth/Throat: Oropharynx is clear and moist. No oropharyngeal exudate.  Neck: Normal range of motion. Neck supple.  Cardiovascular: Normal rate, regular rhythm and normal heart sounds. Exam reveals no gallop and no friction rub.  No  murmur heard. Pulmonary/Chest: Effort normal. No respiratory distress. She has no wheezes. She has no rales.  Decreased breath sounds are noted throughout all lung fields.  Genitourinary:  Genitourinary Comments: The patient has an indurated area in the right posterior labia and a smaller indurated area in the left posterior labia with and a small area of an open wound.  No exudate is noted.  Valda Favia, RN was present and assisted with the patient's exam.  Lymphadenopathy:    She has no cervical adenopathy.  Neurological: She is alert. Coordination normal.  Skin: Skin is warm and dry. No rash noted. She is not diaphoretic. No erythema.  Psychiatric: She has a normal mood and affect. Her behavior is normal. Judgment and thought content normal.    Lab Review:     Component Value Date/Time   NA 140 11/22/2017 1258   NA 141 03/08/2017 1258   K 4.0 11/22/2017 1258   K 3.7 03/08/2017 1258   CL 102 11/22/2017 1258   CO2 28 11/22/2017 1258   CO2 23 03/08/2017 1258   GLUCOSE 98 11/22/2017 1258   GLUCOSE 111 03/08/2017 1258   BUN 14 11/22/2017 1258   BUN 24.1 03/08/2017 1258   CREATININE 0.96 11/22/2017 1258   CREATININE 1.4 (H) 03/08/2017 1258   CALCIUM 8.8 (L) 11/22/2017 1258   CALCIUM 8.8 03/08/2017 1258   PROT 6.7 11/22/2017 1258   PROT 6.4 03/08/2017 1258   ALBUMIN 3.5 11/22/2017 1258   ALBUMIN 3.2 (L) 03/08/2017 1258   AST 18 11/22/2017 1258   AST 8 03/08/2017 1258   ALT 25 11/22/2017 1258   ALT 20 03/08/2017 1258   ALKPHOS 94 11/22/2017 1258   ALKPHOS 65 03/08/2017 1258   BILITOT 0.3 11/22/2017 1258   BILITOT 0.40 03/08/2017 1258  GFRNONAA 59 (L) 11/22/2017 1258   GFRAA >60 11/22/2017 1258       Component Value Date/Time   WBC 8.7 11/22/2017 1258   WBC 8.7 10/01/2017 1340   RBC 2.98 (L) 11/22/2017 1258   HGB 10.5 (L) 11/22/2017 1258   HGB 11.3 (L) 03/08/2017 1258   HCT 32.6 (L) 11/22/2017 1258   HCT 35.7 03/08/2017 1258   PLT 212 11/22/2017 1258   PLT 191  03/08/2017 1258   MCV 109.2 (H) 11/22/2017 1258   MCV 100.6 03/08/2017 1258   MCH 35.3 (H) 11/22/2017 1258   MCHC 32.4 11/22/2017 1258   RDW 15.7 (H) 11/22/2017 1258   RDW 14.7 (H) 03/08/2017 1258   LYMPHSABS 0.9 11/22/2017 1258   LYMPHSABS 1.4 03/08/2017 1258   MONOABS 0.2 11/22/2017 1258   MONOABS 0.8 03/08/2017 1258   EOSABS 0.2 11/22/2017 1258   EOSABS 0.0 03/08/2017 1258   BASOSABS 0.0 11/22/2017 1258   BASOSABS 0.0 03/08/2017 1258   -------------------------------  Imaging from last 24 hours (if applicable):  Radiology interpretation: No results found.      This case was discussed with Dr. Julien Nordmann. He expressed agreement with my management of this patient.

## 2017-12-04 ENCOUNTER — Other Ambulatory Visit: Payer: Self-pay | Admitting: Medical Oncology

## 2017-12-04 ENCOUNTER — Telehealth: Payer: Self-pay | Admitting: Medical Oncology

## 2017-12-04 DIAGNOSIS — C649 Malignant neoplasm of unspecified kidney, except renal pelvis: Secondary | ICD-10-CM

## 2017-12-04 NOTE — Telephone Encounter (Signed)
Cabometyx- when can she restart? Last does 11/21/17. Her nose and vaginal sores are healed. Per Julien Nordmann I told pt it is  okay to restart Cabometyx. Schedule message sent to f/u 12/12/17

## 2017-12-07 ENCOUNTER — Telehealth: Payer: Self-pay | Admitting: Internal Medicine

## 2017-12-07 NOTE — Telephone Encounter (Signed)
Appts scheduled patient notified per 9/17 sch msg

## 2017-12-10 ENCOUNTER — Encounter: Payer: Self-pay | Admitting: Internal Medicine

## 2017-12-10 ENCOUNTER — Inpatient Hospital Stay: Payer: Medicare Other

## 2017-12-10 ENCOUNTER — Telehealth: Payer: Self-pay | Admitting: Internal Medicine

## 2017-12-10 ENCOUNTER — Inpatient Hospital Stay (HOSPITAL_BASED_OUTPATIENT_CLINIC_OR_DEPARTMENT_OTHER): Payer: Medicare Other | Admitting: Internal Medicine

## 2017-12-10 VITALS — BP 133/70 | HR 91 | Temp 98.2°F | Resp 17 | Ht 65.0 in | Wt 143.3 lb

## 2017-12-10 DIAGNOSIS — I82409 Acute embolism and thrombosis of unspecified deep veins of unspecified lower extremity: Secondary | ICD-10-CM

## 2017-12-10 DIAGNOSIS — C7801 Secondary malignant neoplasm of right lung: Secondary | ICD-10-CM

## 2017-12-10 DIAGNOSIS — E876 Hypokalemia: Secondary | ICD-10-CM | POA: Diagnosis not present

## 2017-12-10 DIAGNOSIS — I10 Essential (primary) hypertension: Secondary | ICD-10-CM

## 2017-12-10 DIAGNOSIS — C3432 Malignant neoplasm of lower lobe, left bronchus or lung: Secondary | ICD-10-CM

## 2017-12-10 DIAGNOSIS — R0602 Shortness of breath: Secondary | ICD-10-CM

## 2017-12-10 DIAGNOSIS — N182 Chronic kidney disease, stage 2 (mild): Secondary | ICD-10-CM

## 2017-12-10 DIAGNOSIS — R040 Epistaxis: Secondary | ICD-10-CM | POA: Diagnosis not present

## 2017-12-10 DIAGNOSIS — Z9981 Dependence on supplemental oxygen: Secondary | ICD-10-CM

## 2017-12-10 DIAGNOSIS — C641 Malignant neoplasm of right kidney, except renal pelvis: Secondary | ICD-10-CM

## 2017-12-10 DIAGNOSIS — C3411 Malignant neoplasm of upper lobe, right bronchus or lung: Secondary | ICD-10-CM

## 2017-12-10 DIAGNOSIS — Z5111 Encounter for antineoplastic chemotherapy: Secondary | ICD-10-CM

## 2017-12-10 DIAGNOSIS — C649 Malignant neoplasm of unspecified kidney, except renal pelvis: Secondary | ICD-10-CM

## 2017-12-10 DIAGNOSIS — R21 Rash and other nonspecific skin eruption: Secondary | ICD-10-CM

## 2017-12-10 DIAGNOSIS — R1032 Left lower quadrant pain: Secondary | ICD-10-CM

## 2017-12-10 LAB — CBC WITH DIFFERENTIAL (CANCER CENTER ONLY)
BASOS PCT: 1 %
Basophils Absolute: 0.1 10*3/uL (ref 0.0–0.1)
EOS ABS: 0.6 10*3/uL — AB (ref 0.0–0.5)
Eosinophils Relative: 7 %
HEMATOCRIT: 31.4 % — AB (ref 34.8–46.6)
Hemoglobin: 9.7 g/dL — ABNORMAL LOW (ref 11.6–15.9)
Lymphocytes Relative: 41 %
Lymphs Abs: 3.6 10*3/uL — ABNORMAL HIGH (ref 0.9–3.3)
MCH: 34.9 pg — ABNORMAL HIGH (ref 25.1–34.0)
MCHC: 30.9 g/dL — AB (ref 31.5–36.0)
MCV: 112.9 fL — ABNORMAL HIGH (ref 79.5–101.0)
Monocytes Absolute: 0.4 10*3/uL (ref 0.1–0.9)
Monocytes Relative: 5 %
NEUTROS ABS: 4.1 10*3/uL (ref 1.5–6.5)
Neutrophils Relative %: 46 %
Platelet Count: 216 10*3/uL (ref 145–400)
RBC: 2.78 MIL/uL — ABNORMAL LOW (ref 3.70–5.45)
RDW: 14.8 % — AB (ref 11.2–14.5)
WBC Count: 8.7 10*3/uL (ref 3.9–10.3)

## 2017-12-10 LAB — CMP (CANCER CENTER ONLY)
ALBUMIN: 3.2 g/dL — AB (ref 3.5–5.0)
ALT: 18 U/L (ref 0–44)
ANION GAP: 11 (ref 5–15)
AST: 14 U/L — ABNORMAL LOW (ref 15–41)
Alkaline Phosphatase: 72 U/L (ref 38–126)
BUN: 10 mg/dL (ref 8–23)
CO2: 28 mmol/L (ref 22–32)
Calcium: 8.7 mg/dL — ABNORMAL LOW (ref 8.9–10.3)
Chloride: 105 mmol/L (ref 98–111)
Creatinine: 1.06 mg/dL — ABNORMAL HIGH (ref 0.44–1.00)
GFR, Est AFR Am: >60 mL/min (ref >60)
GFR, Estimated: 52 mL/min — ABNORMAL LOW (ref >60)
GLUCOSE: 88 mg/dL (ref 70–99)
POTASSIUM: 3.5 mmol/L (ref 3.5–5.1)
Sodium: 144 mmol/L (ref 135–145)
TOTAL PROTEIN: 6.4 g/dL — AB (ref 6.5–8.1)

## 2017-12-10 NOTE — Progress Notes (Signed)
[facility] [phone]   [phone]  OFFICE PROGRESS NOTE  [name], MD [address] [place] [address]  DIAGNOSIS:  1) Metastatic renal cell carcinoma, clear cell type, WHO nuclear grade 3 initially diagnosed in September 2010 and recently presented with bilateral pulmonary nodules. 2) history of renal cell carcinoma status post right nephrectomy in September 2010.  PRIOR THERAPY: 1)Stereotactic radiotherapy to pulmonary nodules based on the metabolic activity with no tissue diagnosis. This was performed under the care of Dr. Pablo Ledger. 2)first line treatment with immunotherapy with Nivolumab 3 MG/KG and Ipilumumab 1 mg/Kg every 3 weeks for 4 cycles followed by maintenance Nivolumab every 3 weeks. First dose 12/27/2016. Status post 3cycles.Stopped due to severe itching. 3) Nivolumab480 mg IV every 4 weeksas a single agentstartingon 05/03/2017.Status post2cycles.  Discontinued secondary to disease progression.  CURRENT THERAPY: Cabometyx 40 mg daily.  First dose started on 07/04/2017.  Status post 5 months of treatment.  INTERVAL HISTORY: Melanie Best 72 y.o. female returns to the clinic today for follow-up visit accompanied by her husband.  The patient is feeling fine today with no specific complaints except for intermittent left lower quadrant abdominal pain and few episodes of diarrhea when she takes Cabometyx.  She denied having any nausea or vomiting.  She denied having any chest pain but continues to have shortness of breath and she is currently on home oxygen.  She has no cough or hemoptysis.  She denied having any recent weight loss or night sweats.  She continues to tolerate her treatment with Cabometyx fairly well.  The patient is here today for evaluation with repeat blood work.  MEDICAL HISTORY: Past Medical History:  Diagnosis Date  . Anemia   . Anxiety   . Arthritis    R leg  . Cancer ([place])      kidney  . Chronic fatigue 12/13/2016  . CKD (chronic kidney disease), stage II    removed R kidney- 2010- for Cancer, followed by Dr. Diona Fanti  . COPD (chronic obstructive pulmonary disease) (HCC)    uses O2-2 liters , continuously , followed by Dr. Chase Caller  . Cough 08-02-2015  . Diabetes mellitus, type 2 ([place])   . DM (diabetes mellitus) ([place]) 01/27/2012  . DVT (deep venous thrombosis), left    2011- treated /w coumadin   . Dyslipidemia   . Dysrhythmia   . Esophageal reflux   . GERD (gastroesophageal reflux disease) 01/27/2012  . Glaucoma   . Goals of care, counseling/discussion 12/13/2016  . Hemoptysis 08-02-15  . History of radiation therapy 01/09/12,01/11/12,01/15/12,01/17/12,& 01/19/12   rul lung,50Gy/56fx  . HTN (hypertension) 01/27/2012  . Hyperlipidemia 01/27/2012  . Hypertension    sees Dr. Deforest Hoyles for PCP, denies ever having stress or ECHO, ?when & where she had  last  EKG  . Leukocytosis   . Lung cancer ([place])    NSCLC  . Osteopenia   . Renal cell carcinoma of right kidney ([place]) 12/13/2016  . Shortness of breath   . Syncope   . Tremor   . Urinary incontinence   . Vitamin D deficiency     ALLERGIES:  has No Known Allergies.  MEDICATIONS:  Current Outpatient Medications  Medication Sig Dispense Refill  . bimatoprost (LUMIGAN) 0.01 % SOLN Place 1 drop into both eyes at bedtime.     . brinzolamide (AZOPT) 1 % ophthalmic suspension Place 1 drop into both eyes 3 (three) times daily. Reported on 08/12/2015    .  CABOMETYX 40 MG tablet TAKE 1 TABLET (40MG ) BY MOUTH ONCE DAILY. TAKE ON AN EMPTY STOMACH 1 HOUR BEFORE OR 2 HOURS AFTER MEALS 30 tablet 1  . CALCIUM PO Take by mouth daily.    . cephALEXin (KEFLEX) 500 MG capsule Take 1 capsule (500 mg total) by mouth 3 (three) times daily. 21 capsule 0  . clonazePAM (KLONOPIN) 1 MG tablet Take 1 mg by mouth 2 (two) times daily as needed. For anxiety    . diltiazem (CARDIZEM CD) 180 MG 24 hr capsule Take 180 mg by mouth daily.  0   . ergocalciferol (VITAMIN D2) 50000 UNITS capsule Take 50,000 Units by mouth once a week. Take on Monday    . esomeprazole (NEXIUM) 40 MG capsule Take 40 mg by mouth daily before breakfast.     . furosemide (LASIX) 20 MG tablet Take 20 mg by mouth daily as needed. For fluid retention    . hydrocortisone cream 1 % Apply 1 application topically 2 (two) times daily. 30 g 0  . ipratropium-albuterol (DUONEB) 0.5-2.5 (3) MG/3ML SOLN Take 3 mLs by nebulization 4 (four) times daily as needed. 360 mL 4  . JANUVIA 50 MG tablet Take 50 mg by mouth daily.     Marland Kitchen lidocaine-prilocaine (EMLA) cream Apply 1 application topically as needed.    . lovastatin (MEVACOR) 20 MG tablet Take 20 mg by mouth at bedtime.    . methocarbamol (ROBAXIN) 750 MG tablet Take 750 mg by mouth 2 (two) times daily as needed.  0  . mupirocin ointment (BACTROBAN) 2 % Place 1 application into the nose 2 (two) times daily. 30 g 1  . OXYGEN Inhale 2 L into the lungs continuous.    . Potassium Chloride ER 20 MEQ TBCR TAKE 1 TABLET BY MOUTH TWICE DAILY 20 tablet 0  . predniSONE (DELTASONE) 5 MG tablet Take 4 mg by mouth daily with breakfast.     . primidone (MYSOLINE) 50 MG tablet Take 100 mg by mouth 2 (two) times daily.     . promethazine (PHENERGAN) 25 MG tablet take 1 tablet by mouth every 6 hours if needed for nausea  0  . traMADol (ULTRAM) 50 MG tablet Take 1 tablet by mouth 3 (three) times daily as needed.  1  . XARELTO 20 MG TABS tablet Take 20 mg by mouth daily.  2   No current facility-administered medications for this visit.    Facility-Administered Medications Ordered in Other Visits  Medication Dose Route Frequency Provider Last Rate Last Dose  . sodium chloride flush (NS) 0.9 % injection 10 mL  10 mL Intravenous PRN Curt Bears, MD        SURGICAL HISTORY:  Past Surgical History:  Procedure Laterality Date  . BACK SURGERY     for pinched nerve, 2011, [facility] at [facility]  . EYE SURGERY     cataracts removed, ?IOL  .  FLEXIBLE BRONCHOSCOPY  11/29/2011  . IR FLUORO GUIDE PORT INSERTION RIGHT  01/30/2017  . IR US GUIDE VASC ACCESS RIGHT  01/30/2017  . removed blood clot     left upper abd quad   . right kidney removed    . right nephrectomy      REVIEW OF SYSTEMS:  A comprehensive review of systems was negative except for: Constitutional: positive for fatigue Gastrointestinal: positive for abdominal pain and diarrhea Musculoskeletal: positive for arthralgias and muscle weakness   PHYSICAL EXAMINATION: General appearance: alert, cooperative, fatigued and no distress Head: Normocephalic, without obvious  abnormality, atraumatic Neck: no adenopathy, no JVD, supple, symmetrical, trachea midline and thyroid not enlarged, symmetric, no tenderness/mass/nodules Lymph nodes: Cervical, supraclavicular, and axillary nodes normal. Resp: clear to auscultation bilaterally Back: symmetric, no curvature. ROM normal. No CVA tenderness. Cardio: regular rate and rhythm, S1, S2 normal, no murmur, click, rub or gallop GI: soft, non-tender; bowel sounds normal; no masses,  no organomegaly Extremities: extremities normal, atraumatic, no cyanosis or edema  ECOG PERFORMANCE STATUS: 1 - Symptomatic but completely ambulatory  Blood pressure 133/70, pulse 91, temperature 98.2 F (36.8 C), temperature source Oral, resp. rate 17, height 5\' 5"  (1.651 m), weight 143 lb 4.8 oz (65 kg), SpO2 100 %.  LABORATORY DATA: Lab Results  Component Value Date   WBC 8.7 12/10/2017   HGB 9.7 (L) 12/10/2017   HCT 31.4 (L) 12/10/2017   MCV 112.9 (H) 12/10/2017   PLT 216 12/10/2017      Chemistry      Component Value Date/Time   NA 140 11/22/2017 1258   NA 141 03/08/2017 1258   K 4.0 11/22/2017 1258   K 3.7 03/08/2017 1258   CL 102 11/22/2017 1258   CO2 28 11/22/2017 1258   CO2 23 03/08/2017 1258   BUN 14 11/22/2017 1258   BUN 24.1 03/08/2017 1258   CREATININE 0.96 11/22/2017 1258   CREATININE 1.4 (H) 03/08/2017 1258        Component Value Date/Time   CALCIUM 8.8 (L) 11/22/2017 1258   CALCIUM 8.8 03/08/2017 1258   ALKPHOS 94 11/22/2017 1258   ALKPHOS 65 03/08/2017 1258   AST 18 11/22/2017 1258   AST 8 03/08/2017 1258   ALT 25 11/22/2017 1258   ALT 20 03/08/2017 1258   BILITOT 0.3 11/22/2017 1258   BILITOT 0.40 03/08/2017 1258       RADIOGRAPHIC STUDIES: No results found.  ASSESSMENT AND PLAN: This is a very pleasant 72 years old African-American female with metastatic renal cell carcinoma that was initially diagnosed in 2010 and now presenting with bilateral pulmonary nodules. She was treated with stereotactic radiotherapy to hypermetabolic nodules without tissue diagnosis in the past. Recent imaging studies showed hypermetabolic and enlarging bilateral pulmonary nodules. CT-guided core biopsy of right pulmonary nodule was consistent with metastatic renal cell carcinoma. The patient was treated with a combination of ipilimumab and nivolumab for 3 cycles but ipilimumab was discontinued secondary to significant itching that was intolerable for the patient.  She was treated with 2 more cycles of single agent nivolumab which was discontinued secondary to disease progression. She is currently undergoing treatment with oral Cabometyx 40 mg p.o. daily and has been tolerating this treatment fairly well except for intermittent diarrhea and left lower quadrant abdominal pain. She is status post 5 months of treatment. I recommended for the patient to continue her current treatment with Cabometyx with the same dose. I will see her back for follow-up visit in 1 month for evaluation after repeating CT scan of the chest, abdomen and pelvis for restaging of her disease. For the skin rash I started the patient on hydrocortisone cream 1%. For the deep venous thrombosis, she will continue her current treatment with Xarelto. For the hypokalemia, I will start the patient on potassium chloride 40 mEq p.o. daily for 10 days.   She will need to follow-up with her primary care physician because of her current treatment with Lasix. She will have Port-A-Cath flush every 6 weeks. The patient was advised to call immediately if she has any concerning symptoms in the  interval. The patient voices understanding of current disease status and treatment options and is in agreement with the current care plan. All questions were answered. The patient knows to call the clinic with any problems, questions or concerns. We can certainly see the patient much sooner if necessary.  Disclaimer: This note was dictated with voice recognition software. Similar sounding words can inadvertently be transcribed and may not be corrected upon review.

## 2017-12-10 NOTE — Telephone Encounter (Signed)
Appts scheduled contrast provided avs/calendar printed per 9/23 los

## 2017-12-20 MED FILL — CABOMETYX 40 MG TABLET: 40 | 30 days supply | Qty: 30 | Fill #1

## 2017-12-24 ENCOUNTER — Inpatient Hospital Stay: Payer: Medicare Other

## 2017-12-24 ENCOUNTER — Inpatient Hospital Stay: Payer: Medicare Other | Attending: Internal Medicine

## 2017-12-24 ENCOUNTER — Encounter: Payer: Self-pay | Admitting: Internal Medicine

## 2017-12-24 ENCOUNTER — Inpatient Hospital Stay (HOSPITAL_BASED_OUTPATIENT_CLINIC_OR_DEPARTMENT_OTHER): Payer: Medicare Other | Admitting: Internal Medicine

## 2017-12-24 VITALS — BP 124/67 | HR 87 | Temp 98.3°F | Resp 22 | Ht 65.0 in | Wt 145.0 lb

## 2017-12-24 DIAGNOSIS — C641 Malignant neoplasm of right kidney, except renal pelvis: Secondary | ICD-10-CM

## 2017-12-24 DIAGNOSIS — R21 Rash and other nonspecific skin eruption: Secondary | ICD-10-CM | POA: Insufficient documentation

## 2017-12-24 DIAGNOSIS — C7801 Secondary malignant neoplasm of right lung: Secondary | ICD-10-CM | POA: Diagnosis not present

## 2017-12-24 DIAGNOSIS — Z95828 Presence of other vascular implants and grafts: Secondary | ICD-10-CM

## 2017-12-24 DIAGNOSIS — I10 Essential (primary) hypertension: Secondary | ICD-10-CM

## 2017-12-24 DIAGNOSIS — R197 Diarrhea, unspecified: Secondary | ICD-10-CM | POA: Diagnosis not present

## 2017-12-24 DIAGNOSIS — Z5111 Encounter for antineoplastic chemotherapy: Secondary | ICD-10-CM

## 2017-12-24 DIAGNOSIS — Z79899 Other long term (current) drug therapy: Secondary | ICD-10-CM | POA: Diagnosis not present

## 2017-12-24 LAB — CBC WITH DIFFERENTIAL (CANCER CENTER ONLY)
Basophils Absolute: 0 10*3/uL (ref 0.0–0.1)
Basophils Relative: 0 %
Eosinophils Absolute: 0.6 10*3/uL — ABNORMAL HIGH (ref 0.0–0.5)
Eosinophils Relative: 6 %
HEMATOCRIT: 30.3 % — AB (ref 34.8–46.6)
HEMOGLOBIN: 9.9 g/dL — AB (ref 11.6–15.9)
LYMPHS ABS: 1.6 10*3/uL (ref 0.9–3.3)
Lymphocytes Relative: 18 %
MCH: 35.7 pg — AB (ref 25.1–34.0)
MCHC: 32.8 g/dL (ref 31.5–36.0)
MCV: 108.9 fL — AB (ref 79.5–101.0)
MONOS PCT: 4 %
Monocytes Absolute: 0.4 10*3/uL (ref 0.1–0.9)
NEUTROS ABS: 6.4 10*3/uL (ref 1.5–6.5)
NEUTROS PCT: 72 %
Platelet Count: 219 10*3/uL (ref 145–400)
RBC: 2.78 MIL/uL — ABNORMAL LOW (ref 3.70–5.45)
RDW: 15.3 % — ABNORMAL HIGH (ref 11.2–14.5)
WBC Count: 8.9 10*3/uL (ref 3.9–10.3)

## 2017-12-24 LAB — CMP (CANCER CENTER ONLY)
ALBUMIN: 3.2 g/dL — AB (ref 3.5–5.0)
ALT: 23 U/L (ref 0–44)
ANION GAP: 8 (ref 5–15)
AST: 18 U/L (ref 15–41)
Alkaline Phosphatase: 73 U/L (ref 38–126)
BUN: 12 mg/dL (ref 8–23)
CHLORIDE: 105 mmol/L (ref 98–111)
CO2: 30 mmol/L (ref 22–32)
Calcium: 8.6 mg/dL — ABNORMAL LOW (ref 8.9–10.3)
Creatinine: 0.92 mg/dL (ref 0.44–1.00)
GFR, Estimated: >60 mL/min (ref >60)
GLUCOSE: 125 mg/dL — AB (ref 70–99)
Potassium: 3.5 mmol/L (ref 3.5–5.1)
SODIUM: 143 mmol/L (ref 135–145)
Total Bilirubin: <0.2 mg/dL — ABNORMAL LOW (ref 0.3–1.2)
Total Protein: 6.4 g/dL — ABNORMAL LOW (ref 6.5–8.1)

## 2017-12-24 MED ORDER — SODIUM CHLORIDE 0.9% FLUSH
10.0000 mL | INTRAVENOUS | Status: DC | PRN
  Administered 2017-12-24: 10 mL via INTRAVENOUS
  Filled 2017-12-24: qty 10

## 2017-12-24 MED ORDER — HEPARIN SOD (PORK) LOCK FLUSH 100 UNIT/ML IV SOLN
500.0000 [IU] | Freq: Once | INTRAVENOUS | Status: AC | PRN
  Administered 2017-12-24: 500 [IU] via INTRAVENOUS
  Filled 2017-12-24: qty 5

## 2017-12-24 NOTE — Progress Notes (Signed)
[place] [phone]   [phone]  OFFICE PROGRESS NOTE  Melanie Low, MD [address] [place] [address]  DIAGNOSIS:  1) Metastatic renal cell carcinoma, clear cell type, WHO nuclear grade 3 initially diagnosed in September 2010 and recently presented with bilateral pulmonary nodules. 2) history of renal cell carcinoma status post right nephrectomy in September 2010.  PRIOR THERAPY: 1)Stereotactic radiotherapy to pulmonary nodules based on the metabolic activity with no tissue diagnosis. This was performed under the care of Dr. Pablo Ledger. 2)first line treatment with immunotherapy with Nivolumab 3 MG/KG and Ipilumumab 1 mg/Kg every 3 weeks for 4 cycles followed by maintenance Nivolumab every 3 weeks. First dose 12/27/2016. Status post 3cycles.Stopped due to severe itching. 3) Nivolumab480 mg IV every 4 weeksas a single agentstartingon 05/03/2017.Status post2cycles.  Discontinued secondary to disease progression.  CURRENT THERAPY: Cabometyx 40 mg daily.  First dose started on 07/04/2017.  Status post 6 months of treatment.  INTERVAL HISTORY: Melanie Best 72 y.o. female returns to the clinic today for follow-up visit accompanied by her husband.  The patient is feeling fine today with no concerning complaints except for few episodes of diarrhea.  She is tolerating her treatment with Cabometyx fairly well.  She denied having any chest pain, shortness of breath, cough or hemoptysis.  She has no nausea, vomiting, diarrhea or constipation.  She has no recent weight loss or night sweats.  She is here today for evaluation and repeat blood work.  MEDICAL HISTORY: Past Medical History:  Diagnosis Date  . Anemia   . Anxiety   . Arthritis    R leg  . Cancer ([place])    kidney  . Chronic fatigue 12/13/2016  . CKD (chronic kidney disease), stage II    removed R kidney- 2010- for Cancer, followed by Dr. Diona Fanti  . COPD  (chronic obstructive pulmonary disease) (HCC)    uses O2-2 liters , continuously , followed by Dr. Chase Caller  . Cough 08-02-2015  . Diabetes mellitus, type 2 ([place])   . DM (diabetes mellitus) ([place]) 01/27/2012  . DVT (deep venous thrombosis), left    2011- treated /w coumadin   . Dyslipidemia   . Dysrhythmia   . Esophageal reflux   . GERD (gastroesophageal reflux disease) 01/27/2012  . Glaucoma   . Goals of care, counseling/discussion 12/13/2016  . Hemoptysis 08-02-15  . History of radiation therapy 01/09/12,01/11/12,01/15/12,01/17/12,& 01/19/12   rul lung,50Gy/60fx  . HTN (hypertension) 01/27/2012  . Hyperlipidemia 01/27/2012  . Hypertension    sees Dr. Deforest Hoyles for PCP, denies ever having stress or ECHO, ?when & where she had  last  EKG  . Leukocytosis   . Lung cancer ([place])    NSCLC  . Osteopenia   . Renal cell carcinoma of right kidney ([place]) 12/13/2016  . Shortness of breath   . Syncope   . Tremor   . Urinary incontinence   . Vitamin D deficiency     ALLERGIES:  has No Known Allergies.  MEDICATIONS:  Current Outpatient Medications  Medication Sig Dispense Refill  . bimatoprost (LUMIGAN) 0.01 % SOLN Place 1 drop into both eyes at bedtime.     . brinzolamide (AZOPT) 1 % ophthalmic suspension Place 1 drop into both eyes 3 (three) times daily. Reported on 08/12/2015    . CABOMETYX 40 MG tablet TAKE 1 TABLET (40MG ) BY MOUTH ONCE DAILY. TAKE ON AN EMPTY STOMACH 1 HOUR BEFORE OR 2 HOURS AFTER MEALS 30  tablet 1  . CALCIUM PO Take by mouth daily.    . cephALEXin (KEFLEX) 500 MG capsule Take 1 capsule (500 mg total) by mouth 3 (three) times daily. 21 capsule 0  . clonazePAM (KLONOPIN) 1 MG tablet Take 1 mg by mouth 2 (two) times daily as needed. For anxiety    . diltiazem (CARDIZEM CD) 180 MG 24 hr capsule Take 180 mg by mouth daily.  0  . ergocalciferol (VITAMIN D2) 50000 UNITS capsule Take 50,000 Units by mouth once a week. Take on Monday    . esomeprazole (NEXIUM) 40 MG capsule Take 40 mg  by mouth daily before breakfast.     . FLUAD 0.5 ML SUSY ADM 0.5ML IM UTD  0  . furosemide (LASIX) 20 MG tablet Take 20 mg by mouth daily as needed. For fluid retention    . hydrocortisone cream 1 % Apply 1 application topically 2 (two) times daily. 30 g 0  . ipratropium-albuterol (DUONEB) 0.5-2.5 (3) MG/3ML SOLN Take 3 mLs by nebulization 4 (four) times daily as needed. 360 mL 4  . JANUVIA 50 MG tablet Take 50 mg by mouth daily.     Marland Kitchen lidocaine-prilocaine (EMLA) cream Apply 1 application topically as needed.    . lovastatin (MEVACOR) 20 MG tablet Take 20 mg by mouth at bedtime.    . methocarbamol (ROBAXIN) 750 MG tablet Take 750 mg by mouth 2 (two) times daily as needed.  0  . mupirocin ointment (BACTROBAN) 2 % Place 1 application into the nose 2 (two) times daily. 30 g 1  . OXYGEN Inhale 2 L into the lungs continuous.    . Potassium Chloride ER 20 MEQ TBCR TAKE 1 TABLET BY MOUTH TWICE DAILY 20 tablet 0  . predniSONE (DELTASONE) 5 MG tablet Take 4 mg by mouth daily with breakfast.     . primidone (MYSOLINE) 50 MG tablet Take 100 mg by mouth 2 (two) times daily.     . promethazine (PHENERGAN) 25 MG tablet take 1 tablet by mouth every 6 hours if needed for nausea  0  . traMADol (ULTRAM) 50 MG tablet Take 1 tablet by mouth 3 (three) times daily as needed.  1  . XARELTO 20 MG TABS tablet Take 20 mg by mouth daily.  2   No current facility-administered medications for this visit.    Facility-Administered Medications Ordered in Other Visits  Medication Dose Route Frequency Provider Last Rate Last Dose  . sodium chloride flush (NS) 0.9 % injection 10 mL  10 mL Intravenous PRN Curt Bears, MD        SURGICAL HISTORY:  Past Surgical History:  Procedure Laterality Date  . BACK SURGERY     for pinched nerve, 2011, [facility]  . EYE SURGERY     cataracts removed, ?IOL  . FLEXIBLE BRONCHOSCOPY  11/29/2011  . IR FLUORO GUIDE PORT INSERTION RIGHT  01/30/2017  . IR US GUIDE VASC ACCESS RIGHT   01/30/2017  . removed blood clot     left upper abd quad   . right kidney removed    . right nephrectomy      REVIEW OF SYSTEMS:  A comprehensive review of systems was negative except for: Constitutional: positive for fatigue Gastrointestinal: positive for diarrhea Musculoskeletal: positive for muscle weakness   PHYSICAL EXAMINATION: General appearance: alert, cooperative, fatigued and no distress Head: Normocephalic, without obvious abnormality, atraumatic Neck: no adenopathy, no JVD, supple, symmetrical, trachea midline and thyroid not enlarged, symmetric, no tenderness/mass/nodules Lymph  nodes: Cervical, supraclavicular, and axillary nodes normal. Resp: clear to auscultation bilaterally Back: symmetric, no curvature. ROM normal. No CVA tenderness. Cardio: regular rate and rhythm, S1, S2 normal, no murmur, click, rub or gallop GI: soft, non-tender; bowel sounds normal; no masses,  no organomegaly Extremities: extremities normal, atraumatic, no cyanosis or edema  ECOG PERFORMANCE STATUS: 1 - Symptomatic but completely ambulatory  Blood pressure 124/67, pulse 87, temperature 98.3 F (36.8 C), temperature source Oral, resp. rate (!) 22, height 5\' 5"  (1.651 m), weight 145 lb (65.8 kg), SpO2 100 %.  LABORATORY DATA: Lab Results  Component Value Date   WBC 8.9 12/24/2017   HGB 9.9 (L) 12/24/2017   HCT 30.3 (L) 12/24/2017   MCV 108.9 (H) 12/24/2017   PLT 219 12/24/2017      Chemistry      Component Value Date/Time   NA 144 12/10/2017 0808   NA 141 03/08/2017 1258   K 3.5 12/10/2017 0808   K 3.7 03/08/2017 1258   CL 105 12/10/2017 0808   CO2 28 12/10/2017 0808   CO2 23 03/08/2017 1258   BUN 10 12/10/2017 0808   BUN 24.1 03/08/2017 1258   CREATININE 1.06 (H) 12/10/2017 0808   CREATININE 1.4 (H) 03/08/2017 1258      Component Value Date/Time   CALCIUM 8.7 (L) 12/10/2017 0808   CALCIUM 8.8 03/08/2017 1258   ALKPHOS 72 12/10/2017 0808   ALKPHOS 65 03/08/2017 1258   AST  14 (L) 12/10/2017 0808   AST 8 03/08/2017 1258   ALT 18 12/10/2017 0808   ALT 20 03/08/2017 1258   BILITOT <0.2 (L) 12/10/2017 0808   BILITOT 0.40 03/08/2017 1258       RADIOGRAPHIC STUDIES: No results found.  ASSESSMENT AND PLAN: This is a very pleasant 72 years old African-American female with metastatic renal cell carcinoma that was initially diagnosed in 2010 and now presenting with bilateral pulmonary nodules. She was treated with stereotactic radiotherapy to hypermetabolic nodules without tissue diagnosis in the past. Recent imaging studies showed hypermetabolic and enlarging bilateral pulmonary nodules. CT-guided core biopsy of right pulmonary nodule was consistent with metastatic renal cell carcinoma. The patient was treated with a combination of ipilimumab and nivolumab for 3 cycles but ipilimumab was discontinued secondary to significant itching that was intolerable for the patient.  She was treated with 2 more cycles of single agent nivolumab which was discontinued secondary to disease progression. She is currently undergoing treatment with oral Cabometyx 40 mg p.o. Daily. She continues to tolerate the treatment well.  I recommended for her to continue her current treatment with the same dose.  She will come back for follow-up visit in 3 weeks for evaluation after repeating CT scan of the chest, abdomen and pelvis for restaging of her disease. For the skin rash I started the patient on hydrocortisone cream 1%. She will have Port-A-Cath flush every 6 weeks. The patient was advised to call immediately if she has any concerning symptoms in the interval. The patient voices understanding of current disease status and treatment options and is in agreement with the current care plan. All questions were answered. The patient knows to call the clinic with any problems, questions or concerns. We can certainly see the patient much sooner if necessary.  Disclaimer: This note was dictated  with voice recognition software. Similar sounding words can inadvertently be transcribed and may not be corrected upon review.

## 2018-01-04 ENCOUNTER — Ambulatory Visit (INDEPENDENT_AMBULATORY_CARE_PROVIDER_SITE_OTHER): Payer: Medicare Other | Admitting: Internal Medicine

## 2018-01-04 ENCOUNTER — Encounter: Payer: Self-pay | Admitting: Internal Medicine

## 2018-01-04 ENCOUNTER — Telehealth: Payer: Self-pay | Admitting: Internal Medicine

## 2018-01-04 VITALS — BP 120/70 | HR 91 | Ht 65.0 in | Wt 145.6 lb

## 2018-01-04 DIAGNOSIS — J449 Chronic obstructive pulmonary disease, unspecified: Secondary | ICD-10-CM

## 2018-01-04 DIAGNOSIS — J9611 Chronic respiratory failure with hypoxia: Secondary | ICD-10-CM | POA: Diagnosis not present

## 2018-01-04 NOTE — Telephone Encounter (Signed)
Fax received and placed in MR's lookat for sig  Left detailed [place] at [place] that we received it  MR- please sign thanks!

## 2018-01-04 NOTE — Patient Instructions (Signed)
ICD-10-CM   1. COPD, very severe ([place]) J44.9   2. Chronic hypoxemic respiratory failure (HCC) J96.11     Stable COPD Glad you are up-to-date with the flu shot Glad you are trying to go for a lighter portable innogen oxygen system  Plan -I have signed the form for the lighter portable innogen oxygen system -Continue prednisone, oxygen and DuoNeb as before  Follow-up 6 months or sooner if needed

## 2018-01-04 NOTE — Progress Notes (Signed)
#Rt renal cell cancer Aug 2010 - Dr Diona Fanti Large right renal cell carcinoma with extension into the right renal vein and inferior vena cava  #CRI  - creat 1.2mg % Oct 2014  #COPD  chronic respiratory failure - O2 dependent since 2004 (CT 2010 - emphysema, CXR - per hx clear in 2013)  -- Gold stage 3-4, fev1 0.8L/34%, Ratio 40, TLC 110%, DLCO 35% In May 2013  - Normal ECHO - May 2006 - Rx Mdds include advair, spiriva, daily prednisone 10mg  and duoneb  -> reduced to 4mg  pred 09/09/2013 - REhab since oct 2012  - Fall 2013: Not a transplant candidate - [facility] transplant team screening  #Recurent AECOPD  - multiple in early 2013 - rx by PMD  - 03/01/2012 office visit - levaquin alone, no pred - Start N. acetylcysteine April 2014   #Ex-smoker  - 35 ppd. Quit 2007   #Baseline head tremor and walks with walkerr.   # Oct/Nov 2010  had s/p right nephrectomy for renal cell cancer discovered following MRI for leg/back pain.   #Also, s/p laminectomy following that in 2011 for "pinched nerve" by Dr Rodell Perna.  # Presumed stage 1A NSCLC - RUL nodule  -   In July 2013, CT showed 2.5cm spiculared RUL nodule. d PET scan 10/18/11 where Spiculated hypermetabolic right upper lobe lung nodule. This  measures 2.5 x 1.5 cm and a S.U.V. max of 6.1 on image 77.  - ENB 11/29/11: NON Diagnostic - normal resp cells - s.p empriic curative XRT ending Nov 2013 - Dec 2013: RUL mass improved 1.6cm - March 2014 : RUL mass improved 1.4cm - March 2016 - RUL nodule - down to 1.2cm  #left lung nodules  - July 2013: LUL/LLL 1.5cm and LL 6-91mm - March 2014: :LUL/LLL 1.5cm and no change LLL 70mm and slightly worse - Feb 2015 - LUL. LLL 1.5cm and no change. LLL 69mm and slightly worse -> XRT ending April 2015 - June 2015:  LUL/LLL - ? Same, LLL 66mm and better - March 2016: stable  ///////////////////////////////////////////////////////////////////////////////////////  OV 01/13/2015  Chief Complaint    Patient presents with  . Follow-up    Pt c/o of dyspnea with exertion. Pt is on O2/2L pulse. Pt denies cough/wheeze/CP/tightness.     Last visit 06/30/2014.  FU COPD with chronic respiratory failure- stable. Denies admission, ER visits, pred bursts,. DuoNeb with daily prednisone tablet. Since last visit she is doing DuoNeb. She gets this at [employer]. Cause of $55 for 3 months. She is wondering if she could get something cheaper. She is asking for samples. She's not interested in Spiriva or Advair. She spends most of the day watching TV and playing with a computer husband agrees with her that she has no new issues   Fu lung cancer- s/p xrt. In April 2015. Surveillance CT scan March 2016 shows stability.. Repeat surveillance CT chest October 2016 that I personally visualized shows stability but there is a new left lower lobe lung nodule that is of large size but suspects more inflammatory and repeat nature   Renal cancer.: She is being followed by [facility] for the same. But she tells me that they've asked me to do the surveillance CT of the abdomen at the time she has a surveillance CT scan of the chest.  Her urologist is Dr. Preston Fleeting. This CT in October 2016 did not show any evidence of recurrence   OV 07/09/2015  Chief Complaint  Patient presents with  . Follow-up    SOB worse with exertion about the same as last visit. Denies any wheezing, cp/tightness.    Follow-up COPD, lung cancer with lung nodules and also renal cancer surveillance, in the setting of chronic respiratory failure.   Last follow-up was in fall 2016. Since then she's been doing well. She has intentionally lost a lot of weight. She feels more energetic. She had CT scan in April 2017 nodules are stable. This of the chest. She is upcoming for a surveillance CT chest and renal cancer  CT of the abdomen without contrast end of 2017. Urology has been during the surveillance scans for her kidney. There are no new  issues. sHe is compliant with nebulizers.   OV 01/18/2016  Chief Complaint  Patient presents with  . Follow-up    Pt states breathing is good today. Pt c/o no congestion, tightness or SOB. Pt states no coughing. Pt breathing has not changed since last OV.       follow-up COPD, lung cancer with lung nodules and also renal cancer surveillance in the setting of chronic hypoxemic respiratory failure   last visit April 2017. She was supposed to have surveillance CT chest and CT abdomen in December 2017 and then return to see me. However this visit appointment was made prematurely. She is here for follow-up. She says that she is maintaining her weight loss and therefore she is more energetic. She has no new acute complaints. She continues to wear daily oxygen. She's had to have a CT chest which is supposed to be in December 2017. She is no acute complaints. She is up-to-date with her flu shot. Her husband also endorses no complaints on her behalf.  OV 07/17/2016  Chief Complaint  Patient presents with  . Follow-up    Pt states her breathing is unchanged since last OV. Pt c/o dry cough. Pt deneis chest congestion, CP/tightness, and f/c/s.     Follow-up advanced COPD with chronic hypoxemic respiratory failure in the setting of renal cancer surveillance and lung cancer with lung nodules  December 2017 she had CT scan of the chest and abdomen documented below. Because of concern of renal cancer and also lung cancer referrals were made. She tells me that urology has her on observation therapy. Review of the chart also shows that Dr. Julien Nordmann. A PET scan and shoulder lung nodules for of low uptake and has her on observation therapy. She tells me that overall she is functional at home. She does not as always. Over she does get around the house with oxygen and is able to self-care. There is no new issues. She continues with oxygen, 4 mg daily prednisone and DuoNeb. Husband is here with  her.  IMPRESSION:December 11 11 2017 CT chest and abdomen 1. Multifocal pulmonary nodules are again noted compatible with metastatic disease. Most of these are stable with the exception of a left lower lobe nodule which has increased in size in the interval. 2. Status post right nephrectomy without evidence for local tumor recurrence. 3. Multiple lesions of varying complexity are identified within the left kidney. Arising from the inferior pole of the left kidney is an intermediate attenuating lesion which may represent a hemorrhagic/proteinaceous cyst or solid mass. This is new when compared with 12/15/2014. More definitive assessment of this nodule may be obtained with a contrast enhanced MRI or CT of the abdomen (renal mass protocol). 4. Aortic atherosclerosis.   Electronically Signed   By: Kerby Moors  M.D.   On: 02/29/2016 10:34   OV 01/15/2017  Chief Complaint  Patient presents with  . Follow-up    CT scan 11/07/16 and a CT biopsy 12/06/16.  Pt states that she has been doing good. Denies any SOB, cough, or CP. DME: [facility] 24/7.   Follow-up advanced COPD with chronic hypoxemic respiratory failure in the setting of both renal cancer and lung cancer  Last seen in spring 2018. She returns for routine follow-up. In the interim she had a right lower lobe biopsy of her lung nodule and this showed metastatic renal cell cancerin the lung. She is now on immunotherapy for this. She received one dose. She is not reporting any deterioration in her respiratory symptoms are her baseline functional state which is on a wheelchair   OV 07/17/2017  Chief Complaint  Patient presents with  . Follow-up    Pt stes she has been doing good. Denies any complaints or concerns.    Follow-up advanced COPD with chronic hypoxemic respiratory failure in the setting of both renal cancer and lung cancer. Baseline wheelchair  For transffers but not for ADLs and head-shake    Last seen in  October 2018. Presents for routine follow-up.In the interim her husband who is here with her has lost weight due to lower GI bed but he is now recovered.Patient herself has had progressive renal hands and apparently her medication is being changed. From a COPD perspective she continues to be stable on oxygen, DuoNeb and daily prednisone. She does her ADLs but does not go out of the house and the husband uses wheelchair for transfers.    IMPRESSION: 1. Enlarging left kidney lower pole mass, now 6.2 cm in diameter, with progression and propagation of the tumor thrombus in the left renal vein which currently distends the vein and extends almost to the level of the IVC. Appearance compatible with renal cell carcinoma. There is some borderline new left periaortic adenopathy at the level of the left kidney. 2. Stable appearance of perivascular tumor nodules in the left lower lobe and right upper lobe. 3. Other imaging findings of potential clinical significance: Aortic Atherosclerosis (ICD10-I70.0). Coronary atherosclerosis. Trace pericardial effusion. Stable left renal cysts of varying complexity. Stable 2 mm nonobstructive left kidney lower pole calculus. Prominent stool throughout the colon favors constipation. Lower lumbar degenerative disc disease. 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   Electronically Signed   By: Van Clines M.D.   On: 06/27/2017 07:56   OV 01/04/2018  Subjective:  Patient ID: Melanie Best, female , DOB: 20-Sep-1945 , age 35 y.o. , MRN: [id number] , ADDRESS: [place] [id number]   01/04/2018 -   Chief Complaint  Patient presents with  . Follow-up    States her breathing has been at her baseline, denies cough or any other symptoms.     Follow-up advanced COPD with chronic hypoxemic respiratory failure in the setting of both renal cancer and lung cancer. Baseline wheelchair  For transffers  but not for ADLs and head-shake    HPI Melanie Best 72 y.o. -presents with her husband.  Husband now has intestinal cancer.  She says overall she is stable.  There is no change in her health status.  She continues on oxygen, prednisone and DuoNeb.  She saw Dr. Julien Nordmann oncologist recently.  She is on maintenance therapy.  She has upcoming CT scan of the chest.  But July 2019 CT scan showed improvement.  This is from her cancer.  She is up-to-date with a flu shot.  She wants me to sign a form to get a lighter portable oxygen system.     CAT COPD Symptom & Quality of Life Score (GSK trademark) 0 is no burden. 5 is highest burden 07/17/2017   Never Cough -> Cough all the time 1  No phlegm in chest -> Chest is full of phlegm 3  No chest tightness -> Chest feels very tight 0  No dyspnea for 1 flight stairs/hill -> Very dyspneic for 1 flight of stairs 5  No limitations for ADL at home -> Very limited with ADL at home 5  Confident leaving home -> Not at all confident leaving home 3  Sleep soundly -> Do not sleep soundly because of lung condition 0  Lots of Energy -> No energy at all 3  TOTAL Score (max 40)  20    ROS - per HPI     has a past medical history of Anemia, Anxiety, Arthritis, Cancer ([place]), Chronic fatigue (12/13/2016), CKD (chronic kidney disease), stage II, COPD (chronic obstructive pulmonary disease) ([place]), Cough (08-02-2015), Diabetes mellitus, type 2 ([place]), DM (diabetes mellitus) ([place]) (01/27/2012), DVT (deep venous thrombosis), left, Dyslipidemia, Dysrhythmia, Esophageal reflux, GERD (gastroesophageal reflux disease) (01/27/2012), Glaucoma, Goals of care, counseling/discussion (12/13/2016), Hemoptysis (08-02-15), History of radiation therapy (01/09/12,01/11/12,01/15/12,01/17/12,& 01/19/12), HTN (hypertension) (01/27/2012), Hyperlipidemia (01/27/2012), Hypertension, Leukocytosis, Lung cancer ([place]), Osteopenia, Renal cell carcinoma of right kidney ([place]) (12/13/2016), Shortness of breath,  Syncope, Tremor, Urinary incontinence, and Vitamin D deficiency.   reports that she quit smoking about 13 years ago. Her smoking use included cigarettes. She has a 35.00 pack-year smoking history. She has never used smokeless tobacco.  Past Surgical History:  Procedure Laterality Date  . BACK SURGERY     for pinched nerve, 2011, here at [facility]  . EYE SURGERY     cataracts removed, ?IOL  . FLEXIBLE BRONCHOSCOPY  11/29/2011  . IR FLUORO GUIDE PORT INSERTION RIGHT  01/30/2017  . IR US GUIDE VASC ACCESS RIGHT  01/30/2017  . removed blood clot     left upper abd quad   . right kidney removed    . right nephrectomy      No Known Allergies  Immunization History  Administered Date(s) Administered  . Influenza Split 11/28/2011, 12/18/2012, 12/22/2013, 12/19/2015  . Influenza Whole 11/19/2010  . Influenza, High Dose Seasonal PF 11/22/2016  . Influenza-Unspecified 12/14/2014, 11/22/2016, 12/03/2017  . Pneumococcal Conjugate-13 05/14/2013  . Pneumococcal Polysaccharide-23 02/18/2004  . Td 02/24/2014    Family History  Problem Relation Age of Onset  . Emphysema Father   . Cancer Sister        behind eye  . Cancer Brother        lung  . Cancer Brother        lung  . Cancer Brother        throat  . Breast cancer Neg Hx      Current Outpatient Medications:  .  bimatoprost (LUMIGAN) 0.01 % SOLN, Place 1 drop into both eyes at bedtime. , Disp: , Rfl:  .  brinzolamide (AZOPT) 1 % ophthalmic suspension, Place 1 drop into both eyes 3 (three) times daily. Reported on 08/12/2015, Disp: , Rfl:  .  CABOMETYX 40 MG tablet, TAKE 1 TABLET (40MG ) BY MOUTH ONCE DAILY. TAKE ON AN EMPTY STOMACH 1 HOUR BEFORE OR 2 HOURS AFTER MEALS, Disp: 30 tablet, Rfl: 1 .  CALCIUM PO, Take by mouth daily., Disp: ,  Rfl:  .  clonazePAM (KLONOPIN) 1 MG tablet, Take 1 mg by mouth 2 (two) times daily as needed. For anxiety, Disp: , Rfl:  .  diltiazem (CARDIZEM CD) 180 MG 24 hr capsule, Take 180 mg by mouth daily., Disp:  , Rfl: 0 .  ergocalciferol (VITAMIN D2) 50000 UNITS capsule, Take 50,000 Units by mouth once a week. Take on Monday, Disp: , Rfl:  .  esomeprazole (NEXIUM) 40 MG capsule, Take 40 mg by mouth daily before breakfast. , Disp: , Rfl:  .  FLUAD 0.5 ML SUSY, ADM 0.5ML IM UTD, Disp: , Rfl: 0 .  furosemide (LASIX) 20 MG tablet, Take 20 mg by mouth daily as needed. For fluid retention, Disp: , Rfl:  .  hydrocortisone cream 1 %, Apply 1 application topically 2 (two) times daily., Disp: 30 g, Rfl: 0 .  ipratropium-albuterol (DUONEB) 0.5-2.5 (3) MG/3ML SOLN, Take 3 mLs by nebulization 4 (four) times daily as needed., Disp: 360 mL, Rfl: 4 .  JANUVIA 50 MG tablet, Take 50 mg by mouth daily. , Disp: , Rfl:  .  lidocaine-prilocaine (EMLA) cream, Apply 1 application topically as needed., Disp: , Rfl:  .  lovastatin (MEVACOR) 20 MG tablet, Take 20 mg by mouth at bedtime., Disp: , Rfl:  .  methocarbamol (ROBAXIN) 750 MG tablet, Take 750 mg by mouth 2 (two) times daily as needed., Disp: , Rfl: 0 .  mupirocin ointment (BACTROBAN) 2 %, Place 1 application into the nose 2 (two) times daily., Disp: 30 g, Rfl: 1 .  OXYGEN, Inhale 2 L into the lungs continuous., Disp: , Rfl:  .  Potassium Chloride ER 20 MEQ TBCR, TAKE 1 TABLET BY MOUTH TWICE DAILY, Disp: 20 tablet, Rfl: 0 .  predniSONE (DELTASONE) 5 MG tablet, Take 4 mg by mouth daily with breakfast. , Disp: , Rfl:  .  primidone (MYSOLINE) 50 MG tablet, Take 100 mg by mouth 2 (two) times daily. , Disp: , Rfl:  .  promethazine (PHENERGAN) 25 MG tablet, take 1 tablet by mouth every 6 hours if needed for nausea, Disp: , Rfl: 0 .  traMADol (ULTRAM) 50 MG tablet, Take 1 tablet by mouth 3 (three) times daily as needed., Disp: , Rfl: 1 .  XARELTO 20 MG TABS tablet, Take 20 mg by mouth daily., Disp: , Rfl: 2 No current facility-administered medications for this visit.   Facility-Administered Medications Ordered in Other Visits:  .  sodium chloride flush (NS) 0.9 % injection 10  mL, 10 mL, Intravenous, PRN, Curt Bears, MD      Objective:   Vitals:   01/04/18 1058  BP: 120/70  Pulse: 91  SpO2: 100%  Weight: 145 lb 9.6 oz (66 kg)  Height: 5\' 5"  (1.651 m)    Estimated body mass index is 24.23 kg/m as calculated from the following:   Height as of this encounter: 5\' 5"  (1.651 m).   Weight as of this encounter: 145 lb 9.6 oz (66 kg).  @WEIGHTCHANGE @  [employer]   01/04/18 1058  Weight: 145 lb 9.6 oz (66 kg)     Physical Exam  General Appearance:    Alert, cooperative, no distress, appears stated age - older , Deconditioned looking - yes , OBESE  - no, Sitting on Wheelchair -  yes  Head:    Normocephalic, without obvious abnormality, atraumatic  Eyes:    PERRL, conjunctiva/corneas clear,  Ears:    Normal TM's and external ear canals, both ears  Nose:   Nares  normal, septum midline, mucosa normal, no drainage    or sinus tenderness. OXYGEN ON  - yes . Patient is @ 2LN   Throat:   Lips, mucosa, and tongue normal; teeth and gums normal. Cyanosis on lips - no  Neck:   Supple, symmetrical, trachea midline, no adenopathy;    thyroid:  no enlargement/tenderness/nodules; no carotid   bruit or JVD  Back:     Symmetric, no curvature, ROM normal, no CVA tenderness  Lungs:     Distress - no , Wheeze no, Barrell Chest - yes, Purse lip breathing - no, Crackles - no   Chest Wall:    No tenderness or deformity.    Heart:    Regular rate and rhythm, S1 and S2 normal, no rub   or gallop, Murmur - no  Breast Exam:    NOT DONE  Abdomen:     Soft, non-tender, bowel sounds active all four quadrants,    no masses, no organomegaly. Visceral obesity - yes  Genitalia:   NOT DONE  Rectal:   NOT DONE  Extremities:   Extremities - normal, Has Cane - no, Clubbing - no, Edema - no  Pulses:   2+ and symmetric all extremities  Skin:   Stigmata of Connective Tissue Disease - no  Lymph nodes:   Cervical, supraclavicular, and axillary nodes normal  Psychiatric:   Neurologic:   Pleasant - yes, Anxious - no, Flat affect - no  CAm-ICU - neg, Alert and Oriented x 3 - yes, Moves all 4s - yes, Speech - normal, Cognition - intact. bASELINE HEAD SHAKE           Assessment:       ICD-10-CM   1. COPD, very severe ([place]) J44.9   2. Chronic hypoxemic respiratory failure (HCC) J96.11        Plan:     Patient Instructions     ICD-10-CM   1. COPD, very severe ([place]) J44.9   2. Chronic hypoxemic respiratory failure (HCC) J96.11     Stable COPD Glad you are up-to-date with the flu shot Glad you are trying to go for a lighter portable [employer]ogen oxygen system  Plan -I have signed the form for the lighter portable innogen oxygen system -Continue prednisone, oxygen and DuoNeb as before  Follow-up 6 months or sooner if needed     SIGNATURE    Dr. Brand Males, M.D., F.C.C.P,  Pulmonary and Critical Care Medicine Staff Physician, [place] Director - Interstitial Lung Disease  Program  Pulmonary [place] at [place], [place], [address]  Pager: [phone], If no answer or between  15:00h - 7:00h: call [phone] Telephone: [phone]  11:43 AM 01/04/2018

## 2018-01-04 NOTE — Telephone Encounter (Signed)
Signed and given to [employer]

## 2018-01-07 ENCOUNTER — Inpatient Hospital Stay: Payer: Medicare Other

## 2018-01-07 ENCOUNTER — Encounter (HOSPITAL_COMMUNITY): Payer: Self-pay

## 2018-01-07 ENCOUNTER — Ambulatory Visit (HOSPITAL_COMMUNITY)
Admission: RE | Admit: 2018-01-07 | Discharge: 2018-01-07 | Disposition: A | Discharge status: Home / Self Care | Payer: Medicare Other | Source: Ambulatory Visit | Attending: Internal Medicine | Admitting: Internal Medicine

## 2018-01-07 DIAGNOSIS — R918 Other nonspecific abnormal finding of lung field: Secondary | ICD-10-CM | POA: Diagnosis not present

## 2018-01-07 DIAGNOSIS — J439 Emphysema, unspecified: Secondary | ICD-10-CM | POA: Diagnosis not present

## 2018-01-07 DIAGNOSIS — K8020 Calculus of gallbladder without cholecystitis without obstruction: Secondary | ICD-10-CM | POA: Insufficient documentation

## 2018-01-07 DIAGNOSIS — I700 Atherosclerosis of aorta: Secondary | ICD-10-CM | POA: Diagnosis not present

## 2018-01-07 DIAGNOSIS — Z905 Acquired absence of kidney: Secondary | ICD-10-CM | POA: Diagnosis not present

## 2018-01-07 DIAGNOSIS — C641 Malignant neoplasm of right kidney, except renal pelvis: Secondary | ICD-10-CM | POA: Diagnosis present

## 2018-01-07 DIAGNOSIS — I2510 Atherosclerotic heart disease of native coronary artery without angina pectoris: Secondary | ICD-10-CM | POA: Diagnosis not present

## 2018-01-07 DIAGNOSIS — N2889 Other specified disorders of kidney and ureter: Secondary | ICD-10-CM | POA: Insufficient documentation

## 2018-01-07 LAB — CBC WITH DIFFERENTIAL (CANCER CENTER ONLY)
ABS IMMATURE GRANULOCYTES: 0.03 10*3/uL (ref 0.00–0.07)
BASOS PCT: 0 %
Basophils Absolute: 0 10*3/uL (ref 0.0–0.1)
Eosinophils Absolute: 0.5 10*3/uL (ref 0.0–0.5)
Eosinophils Relative: 5 %
HCT: 32.7 % — ABNORMAL LOW (ref 36.0–46.0)
HEMOGLOBIN: 10.3 g/dL — AB (ref 12.0–15.0)
IMMATURE GRANULOCYTES: 0 %
LYMPHS PCT: 31 %
Lymphs Abs: 3 10*3/uL (ref 0.7–4.0)
MCH: 35.3 pg — ABNORMAL HIGH (ref 26.0–34.0)
MCHC: 31.5 g/dL (ref 30.0–36.0)
MCV: 112 fL — ABNORMAL HIGH (ref 80.0–100.0)
Monocytes Absolute: 0.4 10*3/uL (ref 0.1–1.0)
Monocytes Relative: 4 %
NEUTROS ABS: 5.8 10*3/uL (ref 1.7–7.7)
NEUTROS PCT: 60 %
PLATELETS: 218 10*3/uL (ref 150–400)
RBC: 2.92 MIL/uL — AB (ref 3.87–5.11)
RDW: 14.7 % (ref 11.5–15.5)
WBC: 9.7 10*3/uL (ref 4.0–10.5)
nRBC: 0 % (ref 0.0–0.2)

## 2018-01-07 LAB — CMP (CANCER CENTER ONLY)
ALBUMIN: 3.2 g/dL — AB (ref 3.5–5.0)
ALT: 28 U/L (ref 0–44)
ANION GAP: 12 (ref 5–15)
AST: 17 U/L (ref 15–41)
Alkaline Phosphatase: 89 U/L (ref 38–126)
BILIRUBIN TOTAL: 0.2 mg/dL — AB (ref 0.3–1.2)
BUN: 8 mg/dL (ref 8–23)
CHLORIDE: 105 mmol/L (ref 98–111)
CO2: 26 mmol/L (ref 22–32)
Calcium: 8.4 mg/dL — ABNORMAL LOW (ref 8.9–10.3)
Creatinine: 0.87 mg/dL (ref 0.44–1.00)
GFR, Est AFR Am: >60 mL/min (ref >60)
GLUCOSE: 86 mg/dL (ref 70–99)
POTASSIUM: 3.7 mmol/L (ref 3.5–5.1)
Sodium: 143 mmol/L (ref 135–145)
TOTAL PROTEIN: 6.5 g/dL (ref 6.5–8.1)

## 2018-01-14 ENCOUNTER — Inpatient Hospital Stay: Payer: Medicare Other | Admitting: Oncology

## 2018-01-14 ENCOUNTER — Encounter: Payer: Self-pay | Admitting: Oncology

## 2018-01-14 ENCOUNTER — Telehealth: Payer: Self-pay

## 2018-01-14 VITALS — BP 146/79 | HR 96 | Temp 98.5°F | Resp 18 | Ht 65.0 in | Wt 146.3 lb

## 2018-01-14 DIAGNOSIS — C7801 Secondary malignant neoplasm of right lung: Secondary | ICD-10-CM | POA: Diagnosis not present

## 2018-01-14 DIAGNOSIS — Z5111 Encounter for antineoplastic chemotherapy: Secondary | ICD-10-CM

## 2018-01-14 DIAGNOSIS — C641 Malignant neoplasm of right kidney, except renal pelvis: Secondary | ICD-10-CM

## 2018-01-14 NOTE — Telephone Encounter (Signed)
Printed avs and calender of upcoming appointment. Per 10/28 os

## 2018-01-14 NOTE — Assessment & Plan Note (Addendum)
This is a very pleasant 72 year old African-American female with metastatic renal cell carcinoma that was initially diagnosed in 2010 and now presenting with bilateral pulmonary nodules. She was treated with stereotactic radiotherapy to hypermetabolic nodules without tissue diagnosis in the past. Recent imaging studies showed hypermetabolic and enlarging bilateral pulmonary nodules. CT-guided core biopsy of right pulmonary nodule was consistent with metastatic renal cell carcinoma. The patient was treated with a combination of ipilimumab and nivolumab for 3 cycles but ipilimumab was discontinued secondary to significant itching that was intolerable for the patient.  She was treated with 2 more cycles of single agent nivolumab which was discontinued secondary to disease progression. She is currently undergoing treatment with oral Cabometyx 40 mg p.o. daily. She continues to tolerate the treatment well.  She had a restaging CT scan of the chest, abdomen, pelvis and is here to discuss the results.  The patient was seen with Dr. Julien Nordmann.  CT scan results were discussed with the patient and her husband which showed overall stable disease.  There may be a mild increase in some of the pulmonary nodules and possibly the left renal mass.  We will watch his closely on upcoming scans.  Recommend that she continue Cabometyx 40 mg daily.   She will follow-up in 1 month for evaluation and repeat lab work.  The patient was advised to call immediately if she has any concerning symptoms in the interval. The patient voices understanding of current disease status and treatment options and is in agreement with the current care plan. All questions were answered. The patient knows to call the clinic with any problems, questions or concerns. We can certainly see the patient much sooner if necessary.

## 2018-01-14 NOTE — Progress Notes (Signed)
Mel[facility] OFFICE PROGRESS NOTE  [name], MD [place] [address] [place] [address]  DIAGNOSIS:  1) Metastatic renal cell carcinoma, clear cell type, WHO nuclear grade 3 initially diagnosed in September 2010 and recently presented with bilateral pulmonary nodules. 2) history of renal cell carcinoma status post right nephrectomy in September 2010.  PRIOR THERAPY: 1)Stereotactic radiotherapy to pulmonary nodules based on the metabolic activity with no tissue diagnosis. This was performed under the care of Dr. Pablo Ledger. 2)first line treatment with immunotherapy with Nivolumab 3 MG/KG and Ipilumumab 1 mg/Kg every 3 weeks for 4 cycles followed by maintenance Nivolumab every 3 weeks. First dose 12/27/2016. Status post 3cycles.Stopped due to severe itching. 3)Nivolumab480 mg IV every 4 weeksas a single agentstartingon 05/03/2017.Status post2cycles.Discontinued secondary to disease progression.  CURRENT THERAPY:Cabometyx 40 mg daily.First dose started on 07/04/2017.  Status post 6 months of treatment.  INTERVAL HISTORY: Melanie Best 72 y.o. female returns for routine follow-up visit accompanied by her husband.  The patient is feeling fine today and has no specific complaints.  She denies fevers and chills.  Denies chest pain, shortness of breath, cough, hemoptysis.  Denies nausea, vomiting, constipation, diarrhea.  Denies recent weight loss or night sweats.  The patient had a restaging CT scan and is here to discuss the results.  MEDICAL HISTORY: Past Medical History:  Diagnosis Date  . Anemia   . Anxiety   . Arthritis    R leg  . Cancer ([place])    kidney  . Chronic fatigue 12/13/2016  . CKD (chronic kidney disease), stage II    removed R kidney- 2010- for Cancer, followed by Dr. Diona Fanti  . COPD (chronic obstructive pulmonary disease) ([facility])    uses O2-2 liters , continuously , followed by Dr. Chase Caller  . Cough 08-02-2015  . Diabetes  mellitus, type 2 ([place])   . DM (diabetes mellitus) ([place]) 01/27/2012  . DVT (deep venous thrombosis), left    2011- treated /w coumadin   . Dyslipidemia   . Dysrhythmia   . Esophageal reflux   . GERD (gastroesophageal reflux disease) 01/27/2012  . Glaucoma   . Goals of care, counseling/discussion 12/13/2016  . Hemoptysis 08-02-15  . History of radiation therapy 01/09/12,01/11/12,01/15/12,01/17/12,& 01/19/12   rul lung,50Gy/5fx  . HTN (hypertension) 01/27/2012  . Hyperlipidemia 01/27/2012  . Hypertension    sees Dr. Deforest Hoyles for PCP, denies ever having stress or ECHO, ?when & where she had  last  EKG  . Leukocytosis   . Lung cancer ([place])    NSCLC  . Osteopenia   . Renal cell carcinoma of right kidney () 12/13/2016  . Shortness of breath   . Syncope   . Tremor   . Urinary incontinence   . Vitamin D deficiency     ALLERGIES:  has No Known Allergies.  MEDICATIONS:  Current Outpatient Medications  Medication Sig Dispense Refill  . bimatoprost (LUMIGAN) 0.01 % SOLN Place 1 drop into both eyes at bedtime.     . brinzolamide (AZOPT) 1 % ophthalmic suspension Place 1 drop into both eyes 3 (three) times daily. Reported on 08/12/2015    . CABOMETYX 40 MG tablet TAKE 1 TABLET (40MG ) BY MOUTH ONCE DAILY. TAKE ON AN EMPTY STOMACH 1 HOUR BEFORE OR 2 HOURS AFTER MEALS 30 tablet 1  . CALCIUM PO Take by mouth daily.    . clonazePAM (KLONOPIN) 1 MG tablet Take 1 mg by mouth 2 (two) times daily as needed. For anxiety    .  diltiazem (CARDIZEM CD) 180 MG 24 hr capsule Take 180 mg by mouth daily.  0  . ergocalciferol (VITAMIN D2) 50000 UNITS capsule Take 50,000 Units by mouth once a week. Take on Monday    . esomeprazole (NEXIUM) 40 MG capsule Take 40 mg by mouth daily before breakfast.     . FLUAD 0.5 ML SUSY ADM 0.5ML IM UTD  0  . furosemide (LASIX) 20 MG tablet Take 20 mg by mouth daily as needed. For fluid retention    . hydrocortisone cream 1 % Apply 1 application topically 2 (two) times daily. 30 g  0  . ipratropium-albuterol (DUONEB) 0.5-2.5 (3) MG/3ML SOLN Take 3 mLs by nebulization 4 (four) times daily as needed. 360 mL 4  . JANUVIA 50 MG tablet Take 50 mg by mouth daily.     Marland Kitchen lidocaine-prilocaine (EMLA) cream Apply 1 application topically as needed.    . lovastatin (MEVACOR) 20 MG tablet Take 20 mg by mouth at bedtime.    . methocarbamol (ROBAXIN) 750 MG tablet Take 750 mg by mouth 2 (two) times daily as needed.  0  . mupirocin ointment (BACTROBAN) 2 % Place 1 application into the nose 2 (two) times daily. 30 g 1  . OXYGEN Inhale 2 L into the lungs continuous.    . Potassium Chloride ER 20 MEQ TBCR TAKE 1 TABLET BY MOUTH TWICE DAILY 20 tablet 0  . predniSONE (DELTASONE) 5 MG tablet Take 4 mg by mouth daily with breakfast.     . primidone (MYSOLINE) 50 MG tablet Take 100 mg by mouth 2 (two) times daily.     . promethazine (PHENERGAN) 25 MG tablet take 1 tablet by mouth every 6 hours if needed for nausea  0  . traMADol (ULTRAM) 50 MG tablet Take 1 tablet by mouth 3 (three) times daily as needed.  1  . XARELTO 20 MG TABS tablet Take 20 mg by mouth daily.  2   No current facility-administered medications for this visit.    Facility-Administered Medications Ordered in Other Visits  Medication Dose Route Frequency Provider Last Rate Last Dose  . sodium chloride flush (NS) 0.9 % injection 10 mL  10 mL Intravenous PRN Curt Bears, MD        SURGICAL HISTORY:  Past Surgical History:  Procedure Laterality Date  . BACK SURGERY     for pinched nerve, 2011, [facility] at [facility]  . EYE SURGERY     cataracts removed, ?IOL  . FLEXIBLE BRONCHOSCOPY  11/29/2011  . IR FLUORO GUIDE PORT INSERTION RIGHT  01/30/2017  . IR US GUIDE VASC ACCESS RIGHT  01/30/2017  . removed blood clot     left upper abd quad   . right kidney removed    . right nephrectomy      REVIEW OF SYSTEMS:   Review of Systems  Constitutional: Negative for appetite change, chills, fatigue, fever and unexpected weight change.   HENT:   Negative for mouth sores, nosebleeds, sore throat and trouble swallowing.   Eyes: Negative for eye problems and icterus.  Respiratory: Negative for cough, hemoptysis, shortness of breath and wheezing.   Cardiovascular: Negative for chest pain and leg swelling.  Gastrointestinal: Negative for abdominal pain, constipation, diarrhea, nausea and vomiting.  Genitourinary: Negative for bladder incontinence, difficulty urinating, dysuria, frequency and hematuria.   Musculoskeletal: Negative for back pain, neck pain and neck stiffness.  Skin: Negative for itching and rash.  Neurological: Negative for dizziness, extremity weakness, headaches, light-headedness and seizures.  Hematological: Negative for adenopathy. Does not bruise/bleed easily.  Psychiatric/Behavioral: Negative for confusion, depression and sleep disturbance. The patient is not nervous/anxious.     PHYSICAL EXAMINATION:  Blood pressure (!) 146/79, pulse 96, temperature 98.5 F (36.9 C), temperature source Oral, resp. rate 18, height 5\' 5"  (1.651 m), weight 146 lb 4.8 oz (66.4 kg), SpO2 97 %.  ECOG PERFORMANCE STATUS: 1 - Symptomatic but completely ambulatory  Physical Exam  Constitutional: Oriented to person, place, and time and well-developed, well-nourished, and in no distress. No distress.  HENT:  Head: Normocephalic and atraumatic.  Mouth/Throat: Oropharynx is clear and moist. No oropharyngeal exudate.  Eyes: Conjunctivae are normal. Right eye exhibits no discharge. Left eye exhibits no discharge. No scleral icterus.  Neck: Normal range of motion. Neck supple.  Cardiovascular: Normal rate, regular rhythm, normal heart sounds and intact distal pulses.   Pulmonary/Chest: Effort normal and breath sounds normal. No respiratory distress. No wheezes. No rales.  Abdominal: Soft. Bowel sounds are normal. Exhibits no distension and no mass. There is no tenderness.  Musculoskeletal: Normal range of motion. Exhibits no edema.   Lymphadenopathy:    No cervical adenopathy.  Neurological: Alert and oriented to person, place, and time. Exhibits normal muscle tone. Gait normal. Coordination normal.  Skin: Skin is warm and dry. No rash noted. Not diaphoretic. No erythema. No pallor.  Psychiatric: Mood, memory and judgment normal.  Vitals reviewed.  LABORATORY DATA: Lab Results  Component Value Date   WBC 9.7 01/07/2018   HGB 10.3 (L) 01/07/2018   HCT 32.7 (L) 01/07/2018   MCV 112.0 (H) 01/07/2018   PLT 218 01/07/2018      Chemistry      Component Value Date/Time   NA 143 01/07/2018 1022   NA 141 03/08/2017 1258   K 3.7 01/07/2018 1022   K 3.7 03/08/2017 1258   CL 105 01/07/2018 1022   CO2 26 01/07/2018 1022   CO2 23 03/08/2017 1258   BUN 8 01/07/2018 1022   BUN 24.1 03/08/2017 1258   CREATININE 0.87 01/07/2018 1022   CREATININE 1.4 (H) 03/08/2017 1258      Component Value Date/Time   CALCIUM 8.4 (L) 01/07/2018 1022   CALCIUM 8.8 03/08/2017 1258   ALKPHOS 89 01/07/2018 1022   ALKPHOS 65 03/08/2017 1258   AST 17 01/07/2018 1022   AST 8 03/08/2017 1258   ALT 28 01/07/2018 1022   ALT 20 03/08/2017 1258   BILITOT 0.2 (L) 01/07/2018 1022   BILITOT 0.40 03/08/2017 1258       RADIOGRAPHIC STUDIES:  Ct Abdomen Pelvis Wo Contrast  Result Date: 01/07/2018 CLINICAL DATA:  Metastatic renal cell carcinoma. Diabetes. Straight age 84 kidney disease. Nephrectomy in 2010. On chemotherapy and immunotherapy. EXAM: CT CHEST, ABDOMEN AND PELVIS WITHOUT CONTRAST TECHNIQUE: Multidetector CT imaging of the chest, abdomen and pelvis was performed following the standard protocol without IV contrast. COMPARISON:  10/01/2017 FINDINGS: CT CHEST FINDINGS Cardiovascular: right-sided Port-A-Cath which terminates at the high right atrium. Normal heart size with LAD coronary artery atherosclerosis. Anterior pericardial minimal fluid or thickening. Aortic and branch vessel atherosclerosis. Mediastinum/Nodes: No supraclavicular  adenopathy. No mediastinal or definite hilar adenopathy, given limitations of unenhanced CT. Lungs/Pleura: No pleural fluid. Moderate to marked bullous type emphysema. Posterior right upper lobe peribronchovascular soft tissue density measures 2.4 x 1.1 cm on image 44/6 and is similar to 2.4 x 1.0 cm previously. Index peribronchovascular left lower lobe pulmonary nodule measures 11 mm on image 92/6 versus 10 mm on the  prior exam (when remeasured). More lateral left lower lobe peribronchovascular consolidation is more solid today, including on image 81/6. At the site of ground-glass and airspace disease on the prior. Scattered tiny pulmonary nodules are similar and are identified on series 6. These are all on the order of 2-3 mm. Musculoskeletal: No acute osseous abnormality. CT ABDOMEN PELVIS FINDINGS Hepatobiliary: Normal noncontrast appearance of the liver. Gallstones without acute cholecystitis or biliary duct dilatation. Pancreas: Normal, without mass or ductal dilatation. Spleen: Normal in size, without focal abnormality. Adrenals/Urinary Tract: Normal adrenal glands. Right nephrectomy, without locally recurrent disease. Hypoattenuating left renal lesions are again identified. There are also small interpolar left renal hyperattenuating lesions which are most likely complex cysts. The lower pole left renal lesion is difficult to delineate secondary to noncontrast technique. Measures on the order of 5.3 x 4.9 cm on image 71/2. Compare 4.5 x 4.4 cm on the prior. No hydronephrosis. Decompressed urinary bladder. Stomach/Bowel: Normal stomach, without wall thickening. Normal colon, appendix, and terminal ileum. Normal small bowel. Vascular/Lymphatic: Aortic and branch vessel atherosclerosis. Left renal vein is enlarged, presumably secondary to tumor thrombus when correlated with the prior contrast-enhanced CT. Measures on the order of 1.9 cm on image 62/2 versus 1.7 cm on the prior. No abdominopelvic adenopathy.  Reproductive: Retroverted uterus.  No adnexal mass. Other: No significant free fluid. Musculoskeletal: Lumbosacral spondylosis with prominent disc bulges at L4-5 and L5-S1. A left-sided L1 sclerotic lesion is similar. IMPRESSION: CT CHEST IMPRESSION 1. Suspect minimal progression of pulmonary metastasis. Peribronchovascular nodules in the left lower lobe, including a minimally enlarged index nodule. Progressive soft tissue density in the lateral left lower lobe at the site of ground-glass and airspace disease on the prior. This could represent evolving radiation change or progressive metastatic disease. Peribronchovascular soft tissue density in the right upper lobe is similar. 2. No thoracic adenopathy. 3. Aortic atherosclerosis (ICD10-I70.0), coronary artery atherosclerosis and emphysema (ICD10-J43.9). CT ABDOMEN AND PELVIS IMPRESSION 1. Status post right nephrectomy. The left renal mass is not well delineated on this noncontrast exam. Felt to be similar to slightly enlarged compared to the prior. Persistent left renal vein involvement, presumably secondary to tumor thrombus. 2. No abdominopelvic adenopathy. 3. Cholelithiasis. Electronically Signed   By: Abigail Miyamoto M.D.   On: 01/07/2018 14:15   Ct Chest Wo Contrast  Result Date: 01/07/2018 CLINICAL DATA:  Metastatic renal cell carcinoma. Diabetes. Straight age 74 kidney disease. Nephrectomy in 2010. On chemotherapy and immunotherapy. EXAM: CT CHEST, ABDOMEN AND PELVIS WITHOUT CONTRAST TECHNIQUE: Multidetector CT imaging of the chest, abdomen and pelvis was performed following the standard protocol without IV contrast. COMPARISON:  10/01/2017 FINDINGS: CT CHEST FINDINGS Cardiovascular: right-sided Port-A-Cath which terminates at the high right atrium. Normal heart size with LAD coronary artery atherosclerosis. Anterior pericardial minimal fluid or thickening. Aortic and branch vessel atherosclerosis. Mediastinum/Nodes: No supraclavicular adenopathy. No  mediastinal or definite hilar adenopathy, given limitations of unenhanced CT. Lungs/Pleura: No pleural fluid. Moderate to marked bullous type emphysema. Posterior right upper lobe peribronchovascular soft tissue density measures 2.4 x 1.1 cm on image 44/6 and is similar to 2.4 x 1.0 cm previously. Index peribronchovascular left lower lobe pulmonary nodule measures 11 mm on image 92/6 versus 10 mm on the prior exam (when remeasured). More lateral left lower lobe peribronchovascular consolidation is more solid today, including on image 81/6. At the site of ground-glass and airspace disease on the prior. Scattered tiny pulmonary nodules are similar and are identified on series 6. These are all on  the order of 2-3 mm. Musculoskeletal: No acute osseous abnormality. CT ABDOMEN PELVIS FINDINGS Hepatobiliary: Normal noncontrast appearance of the liver. Gallstones without acute cholecystitis or biliary duct dilatation. Pancreas: Normal, without mass or ductal dilatation. Spleen: Normal in size, without focal abnormality. Adrenals/Urinary Tract: Normal adrenal glands. Right nephrectomy, without locally recurrent disease. Hypoattenuating left renal lesions are again identified. There are also small interpolar left renal hyperattenuating lesions which are most likely complex cysts. The lower pole left renal lesion is difficult to delineate secondary to noncontrast technique. Measures on the order of 5.3 x 4.9 cm on image 71/2. Compare 4.5 x 4.4 cm on the prior. No hydronephrosis. Decompressed urinary bladder. Stomach/Bowel: Normal stomach, without wall thickening. Normal colon, appendix, and terminal ileum. Normal small bowel. Vascular/Lymphatic: Aortic and branch vessel atherosclerosis. Left renal vein is enlarged, presumably secondary to tumor thrombus when correlated with the prior contrast-enhanced CT. Measures on the order of 1.9 cm on image 62/2 versus 1.7 cm on the prior. No abdominopelvic adenopathy. Reproductive:  Retroverted uterus.  No adnexal mass. Other: No significant free fluid. Musculoskeletal: Lumbosacral spondylosis with prominent disc bulges at L4-5 and L5-S1. A left-sided L1 sclerotic lesion is similar. IMPRESSION: CT CHEST IMPRESSION 1. Suspect minimal progression of pulmonary metastasis. Peribronchovascular nodules in the left lower lobe, including a minimally enlarged index nodule. Progressive soft tissue density in the lateral left lower lobe at the site of ground-glass and airspace disease on the prior. This could represent evolving radiation change or progressive metastatic disease. Peribronchovascular soft tissue density in the right upper lobe is similar. 2. No thoracic adenopathy. 3. Aortic atherosclerosis (ICD10-I70.0), coronary artery atherosclerosis and emphysema (ICD10-J43.9). CT ABDOMEN AND PELVIS IMPRESSION 1. Status post right nephrectomy. The left renal mass is not well delineated on this noncontrast exam. Felt to be similar to slightly enlarged compared to the prior. Persistent left renal vein involvement, presumably secondary to tumor thrombus. 2. No abdominopelvic adenopathy. 3. Cholelithiasis. Electronically Signed   By: Abigail Miyamoto M.D.   On: 01/07/2018 14:15     ASSESSMENT/PLAN:  Renal cell carcinoma of right kidney [facility]) This is a very pleasant 72 year old African-American female with metastatic renal cell carcinoma that was initially diagnosed in 2010 and now presenting with bilateral pulmonary nodules. She was treated with stereotactic radiotherapy to hypermetabolic nodules without tissue diagnosis in the past. Recent imaging studies showed hypermetabolic and enlarging bilateral pulmonary nodules. CT-guided core biopsy of right pulmonary nodule was consistent with metastatic renal cell carcinoma. The patient was treated with a combination of ipilimumab and nivolumab for 3 cycles but ipilimumab was discontinued secondary to significant itching that was intolerable for the  patient.  She was treated with 2 more cycles of single agent nivolumab which was discontinued secondary to disease progression. She is currently undergoing treatment with oral Cabometyx 40 mg p.o. daily. She continues to tolerate the treatment well.  She had a restaging CT scan of the chest, abdomen, pelvis and is here to discuss the results.  The patient was seen with Dr. Julien Nordmann.  CT scan results were discussed with the patient and her husband which showed overall stable disease.  There may be a mild increase in some of the pulmonary nodules and possibly the left renal mass.  We will watch his closely on upcoming scans.  Recommend that she continue Cabometyx 40 mg daily.   She will follow-up in 1 month for evaluation and repeat lab work.  The patient was advised to call immediately if she has any  concerning symptoms in the interval. The patient voices understanding of current disease status and treatment options and is in agreement with the current care plan. All questions were answered. The patient knows to call the clinic with any problems, questions or concerns. We can certainly see the patient much sooner if necessary.   Orders Placed This Encounter  Procedures  . CBC with Differential (Cancer Center Only)    Standing Status:   Future    Standing Expiration Date:   01/15/2019  . CMP ([place] only)    Standing Status:   Future    Standing Expiration Date:   01/15/2019  . CBC with Differential (Cancer Center Only)    Standing Status:   Future    Standing Expiration Date:   01/15/2019  . CMP ([place] only)    Standing Status:   Future    Standing Expiration Date:   01/15/2019     Mikey Bussing, DNP, AGPCNP-BC, AOCNP 01/14/18   ADDENDUM: Hematology/Oncology Attending: I had a face-to-face encounter with the patient today.  I recommended her care plan.  This is a very pleasant 72 years old African-American female with metastatic renal cell carcinoma status post treatment  with immunotherapy initially but this was discontinued secondary to intolerance and disease progression. The patient was started on treatment with Cabometyx with a reduced dose of 40 mg p.o. daily and has been tolerating this treatment much better.  She is status post 6 months of treatment. She had repeat CT scan of the chest, abdomen and pelvis performed recently.  I personally and independently reviewed the scans and discussed the results with the patient and her husband.  Her scan showed an general stable disease except for mild increase in some of the nodules and lymph nodes. I recommended for the patient to continue her current treatment with Cabometyx with the same dose. I will see her back for follow-up visit in 1 months for evaluation with repeat blood work. She was advised to call immediately if she has any concerning symptoms in the interval.  Disclaimer: This note was dictated with voice recognition software. Similar sounding words can inadvertently be transcribed and may be missed upon review. Eilleen Kempf, MD 01/14/18

## 2018-01-21 ENCOUNTER — Other Ambulatory Visit: Payer: Self-pay | Admitting: Internal Medicine

## 2018-01-21 DIAGNOSIS — C641 Malignant neoplasm of right kidney, except renal pelvis: Secondary | ICD-10-CM

## 2018-01-24 MED FILL — CABOMETYX 40 MG TABLET: 40 | 30 days supply | Qty: 30 | Fill #0

## 2018-02-04 ENCOUNTER — Inpatient Hospital Stay: Payer: Medicare Other | Attending: Internal Medicine

## 2018-02-04 DIAGNOSIS — K1379 Other lesions of oral mucosa: Secondary | ICD-10-CM | POA: Insufficient documentation

## 2018-02-04 DIAGNOSIS — Z79899 Other long term (current) drug therapy: Secondary | ICD-10-CM | POA: Insufficient documentation

## 2018-02-04 DIAGNOSIS — E876 Hypokalemia: Secondary | ICD-10-CM | POA: Insufficient documentation

## 2018-02-04 DIAGNOSIS — C641 Malignant neoplasm of right kidney, except renal pelvis: Secondary | ICD-10-CM | POA: Insufficient documentation

## 2018-02-04 DIAGNOSIS — C7801 Secondary malignant neoplasm of right lung: Secondary | ICD-10-CM | POA: Insufficient documentation

## 2018-02-04 DIAGNOSIS — L989 Disorder of the skin and subcutaneous tissue, unspecified: Secondary | ICD-10-CM | POA: Insufficient documentation

## 2018-02-11 ENCOUNTER — Inpatient Hospital Stay (HOSPITAL_BASED_OUTPATIENT_CLINIC_OR_DEPARTMENT_OTHER): Payer: Medicare Other | Admitting: Oncology

## 2018-02-11 ENCOUNTER — Inpatient Hospital Stay: Payer: Medicare Other

## 2018-02-11 ENCOUNTER — Encounter: Payer: Self-pay | Admitting: Oncology

## 2018-02-11 VITALS — BP 141/70 | HR 114 | Temp 98.8°F | Resp 12 | Ht 65.0 in | Wt 144.6 lb

## 2018-02-11 DIAGNOSIS — L989 Disorder of the skin and subcutaneous tissue, unspecified: Secondary | ICD-10-CM

## 2018-02-11 DIAGNOSIS — C641 Malignant neoplasm of right kidney, except renal pelvis: Secondary | ICD-10-CM

## 2018-02-11 DIAGNOSIS — C7801 Secondary malignant neoplasm of right lung: Secondary | ICD-10-CM | POA: Diagnosis not present

## 2018-02-11 DIAGNOSIS — K1379 Other lesions of oral mucosa: Secondary | ICD-10-CM | POA: Diagnosis not present

## 2018-02-11 DIAGNOSIS — Z5111 Encounter for antineoplastic chemotherapy: Secondary | ICD-10-CM

## 2018-02-11 DIAGNOSIS — Z95828 Presence of other vascular implants and grafts: Secondary | ICD-10-CM

## 2018-02-11 DIAGNOSIS — E876 Hypokalemia: Secondary | ICD-10-CM | POA: Insufficient documentation

## 2018-02-11 DIAGNOSIS — Z79899 Other long term (current) drug therapy: Secondary | ICD-10-CM | POA: Diagnosis not present

## 2018-02-11 LAB — CBC WITH DIFFERENTIAL (CANCER CENTER ONLY)
Abs Immature Granulocytes: 0.02 10*3/uL (ref 0.00–0.07)
BASOS PCT: 0 %
Basophils Absolute: 0 10*3/uL (ref 0.0–0.1)
EOS ABS: 0.3 10*3/uL (ref 0.0–0.5)
Eosinophils Relative: 3 %
HCT: 32.5 % — ABNORMAL LOW (ref 36.0–46.0)
Hemoglobin: 10 g/dL — ABNORMAL LOW (ref 12.0–15.0)
Immature Granulocytes: 0 %
Lymphocytes Relative: 16 %
Lymphs Abs: 1.5 10*3/uL (ref 0.7–4.0)
MCH: 35.2 pg — AB (ref 26.0–34.0)
MCHC: 30.8 g/dL (ref 30.0–36.0)
MCV: 114.4 fL — ABNORMAL HIGH (ref 80.0–100.0)
MONO ABS: 0.4 10*3/uL (ref 0.1–1.0)
Monocytes Relative: 4 %
NEUTROS PCT: 77 %
Neutro Abs: 7.1 10*3/uL (ref 1.7–7.7)
PLATELETS: 179 10*3/uL (ref 150–400)
RBC: 2.84 MIL/uL — AB (ref 3.87–5.11)
RDW: 15.6 % — AB (ref 11.5–15.5)
WBC: 9.3 10*3/uL (ref 4.0–10.5)
nRBC: 0 % (ref 0.0–0.2)

## 2018-02-11 LAB — CMP (CANCER CENTER ONLY)
ALT: 28 U/L (ref 0–44)
ANION GAP: 13 (ref 5–15)
AST: 24 U/L (ref 15–41)
Albumin: 3.4 g/dL — ABNORMAL LOW (ref 3.5–5.0)
Alkaline Phosphatase: 85 U/L (ref 38–126)
BUN: 15 mg/dL (ref 8–23)
CHLORIDE: 106 mmol/L (ref 98–111)
CO2: 26 mmol/L (ref 22–32)
Calcium: 8.6 mg/dL — ABNORMAL LOW (ref 8.9–10.3)
Creatinine: 1.14 mg/dL — ABNORMAL HIGH (ref 0.44–1.00)
GFR, EST AFRICAN AMERICAN: 55 mL/min — AB (ref >60)
GFR, EST NON AFRICAN AMERICAN: 48 mL/min — AB (ref >60)
Glucose, Bld: 129 mg/dL — ABNORMAL HIGH (ref 70–99)
POTASSIUM: 3.3 mmol/L — AB (ref 3.5–5.1)
Sodium: 145 mmol/L (ref 135–145)
Total Bilirubin: 0.3 mg/dL (ref 0.3–1.2)
Total Protein: 6.3 g/dL — ABNORMAL LOW (ref 6.5–8.1)

## 2018-02-11 MED ORDER — MAGIC MOUTHWASH W/LIDOCAINE: 5.0000 mL | Freq: Four times a day (QID) | ORAL | 0 refills | Status: DC | PRN

## 2018-02-11 MED ORDER — SODIUM CHLORIDE 0.9% FLUSH
10.0000 mL | INTRAVENOUS | Status: DC | PRN
  Administered 2018-02-11: 10 mL via INTRAVENOUS
  Filled 2018-02-11: qty 10

## 2018-02-11 MED ORDER — HEPARIN SOD (PORK) LOCK FLUSH 100 UNIT/ML IV SOLN
500.0000 [IU] | Freq: Once | INTRAVENOUS | Status: AC | PRN
  Administered 2018-02-11: 500 [IU] via INTRAVENOUS
  Filled 2018-02-11: qty 5

## 2018-02-11 NOTE — Assessment & Plan Note (Addendum)
This is a very pleasant 72 year old African-American female with metastatic renal cell carcinoma that was initially diagnosed in 2010 and now presenting with bilateral pulmonary nodules. She was treated with stereotactic radiotherapy to hypermetabolic nodules without tissue diagnosis in the past. Recent imaging studies showed hypermetabolic and enlarging bilateral pulmonary nodules. CT-guided core biopsy of right pulmonary nodule was consistent with metastatic renal cell carcinoma. The patient was treated with a combination of ipilimumab and nivolumab for 3 cycles but ipilimumab was discontinued secondary to significant itching that was intolerable for the patient. She was treated with 2 more cycles of single agent nivolumab which was discontinued secondary to disease progression. She is currently undergoing treatment with oral Cabometyx 40 mg p.o. daily.  Status post 7 months of treatment. She continues to tolerate the treatment well.    Recommend for her to continue on her current treatment with Cabometyx 40 mg daily.  She will follow-up in 1 month for evaluation and repeat lab work.  For her mouth sore, she was given a prescription for Magic mouthwash.  For the irritated skin on her right foot, she was advised to use moisturizer at least 2-3 times a day.  The patient has mild hypokalemia on her labs today.  She is not currently taking her K Dur that is on her medication list.  I advised her to take this at least once a day for the next 7 days.  The patient was advised to call immediately if she has any concerning symptoms in the interval. The patient voices understanding of current disease status and treatment options and is in agreement with the current care plan. All questions were answered. The patient knows to call the clinic with any problems, questions or concerns. We can certainly see the patient much sooner if necessary.

## 2018-02-11 NOTE — Progress Notes (Signed)
[place] OFFICE PROGRESS NOTE  [name], MD [address] [place] [address]  DIAGNOSIS: 1) Metastatic renal cell carcinoma, clear cell type, WHO nuclear grade 3 initially diagnosed in September 2010 and recently presented with bilateral pulmonary nodules. 2) history of renal cell carcinoma status post right nephrectomy in September 2010.  PRIOR THERAPY: 1)Stereotactic radiotherapy to pulmonary nodules based on the metabolic activity with no tissue diagnosis. This was performed under the care of Dr. Pablo Ledger. 2)first line treatment with immunotherapy with Nivolumab 3 MG/KG and Ipilumumab 1 mg/Kg every 3 weeks for 4 cycles followed by maintenance Nivolumab every 3 weeks. First dose 12/27/2016. Status post 3cycles.Stopped due to severe itching. 3)Nivolumab480 mg IV every 4 weeksas a single agentstartingon 05/03/2017.Status post2cycles.Discontinued secondary to disease progression.  CURRENT THERAPY:Cabometyx 40 mg daily.First dose started on 07/04/2017. Status post 36months of treatment.  INTERVAL HISTORY: Melanie Best 72 y.o. female returns for routine follow-up visit accompanied by her husband.  The patient is feeling fine today has no specific complaints except for a mouth sore on the left side of her tongue and irritation to her right foot.  She denies fevers and chills.  Denies chest pain, shortness of breath, cough, hemoptysis.  Denies nausea, vomiting, constipation, diarrhea.  Denies recent weight loss or night sweats.  The patient is here for evaluation and repeat lab work.  MEDICAL HISTORY: Past Medical History:  Diagnosis Date  . Anemia   . Anxiety   . Arthritis    R leg  . Cancer ([place])    kidney  . Chronic fatigue 12/13/2016  . CKD (chronic kidney disease), stage II    removed R kidney- 2010- for Cancer, followed by Dr. Diona Fanti  . COPD (chronic obstructive pulmonary disease) (HCC)    uses O2-2 liters ,  continuously , followed by Dr. Chase Caller  . Cough 08-02-2015  . Diabetes mellitus, type 2 ([place])   . DM (diabetes mellitus) ([place]) 01/27/2012  . DVT (deep venous thrombosis), left    2011- treated /w coumadin   . Dyslipidemia   . Dysrhythmia   . Esophageal reflux   . GERD (gastroesophageal reflux disease) 01/27/2012  . Glaucoma   . Goals of care, counseling/discussion 12/13/2016  . Hemoptysis 08-02-15  . History of radiation therapy 01/09/12,01/11/12,01/15/12,01/17/12,& 01/19/12   rul lung,50Gy/58fx  . HTN (hypertension) 01/27/2012  . Hyperlipidemia 01/27/2012  . Hypertension    sees Dr. Deforest Hoyles for PCP, denies ever having stress or ECHO, ?when & where she had  last  EKG  . Leukocytosis   . Lung cancer ([place])    NSCLC  . Osteopenia   . Renal cell carcinoma of right kidney ([place]) 12/13/2016  . Shortness of breath   . Syncope   . Tremor   . Urinary incontinence   . Vitamin D deficiency     ALLERGIES:  has No Known Allergies.  MEDICATIONS:  Current Outpatient Medications  Medication Sig Dispense Refill  . bimatoprost (LUMIGAN) 0.01 % SOLN Place 1 drop into both eyes at bedtime.     . brinzolamide (AZOPT) 1 % ophthalmic suspension Place 1 drop into both eyes 3 (three) times daily. Reported on 08/12/2015    . CABOMETYX 40 MG tablet TAKE 1 TABLET (40MG ) BY MOUTH ONCE DAILY. TAKE ON AN EMPTY STOMACH 1 HOUR BEFORE OR 2 HOURS AFTER MEALS 30 tablet 1  . CALCIUM PO Take by mouth daily.    . clonazePAM (KLONOPIN) 1 MG tablet Take 1 mg by mouth  2 (two) times daily as needed. For anxiety    . diltiazem (CARDIZEM CD) 180 MG 24 hr capsule Take 180 mg by mouth daily.  0  . ergocalciferol (VITAMIN D2) 50000 UNITS capsule Take 50,000 Units by mouth once a week. Take on Monday    . esomeprazole (NEXIUM) 40 MG capsule Take 40 mg by mouth daily before breakfast.     . FLUAD 0.5 ML SUSY ADM 0.5ML IM UTD  0  . furosemide (LASIX) 20 MG tablet Take 20 mg by mouth daily as needed. For fluid retention    .  hydrocortisone cream 1 % Apply 1 application topically 2 (two) times daily. 30 g 0  . ipratropium-albuterol (DUONEB) 0.5-2.5 (3) MG/3ML SOLN Take 3 mLs by nebulization 4 (four) times daily as needed. 360 mL 4  . JANUVIA 50 MG tablet Take 50 mg by mouth daily.     Marland Kitchen lidocaine-prilocaine (EMLA) cream Apply 1 application topically as needed.    . lovastatin (MEVACOR) 20 MG tablet Take 20 mg by mouth at bedtime.    . methocarbamol (ROBAXIN) 750 MG tablet Take 750 mg by mouth 2 (two) times daily as needed.  0  . mupirocin ointment (BACTROBAN) 2 % Place 1 application into the nose 2 (two) times daily. 30 g 1  . OXYGEN Inhale 2 L into the lungs continuous.    . Potassium Chloride ER 20 MEQ TBCR TAKE 1 TABLET BY MOUTH TWICE DAILY 20 tablet 0  . predniSONE (DELTASONE) 5 MG tablet Take 4 mg by mouth daily with breakfast.     . primidone (MYSOLINE) 50 MG tablet Take 100 mg by mouth 2 (two) times daily.     . promethazine (PHENERGAN) 25 MG tablet take 1 tablet by mouth every 6 hours if needed for nausea  0  . traMADol (ULTRAM) 50 MG tablet Take 1 tablet by mouth 3 (three) times daily as needed.  1  . XARELTO 20 MG TABS tablet Take 20 mg by mouth daily.  2  . magic mouthwash w/lidocaine SOLN Take 5 mLs by mouth 4 (four) times daily as needed for mouth pain. Swish and spit before meals and at bedtime. 480 mL 0   No current facility-administered medications for this visit.    Facility-Administered Medications Ordered in Other Visits  Medication Dose Route Frequency Provider Last Rate Last Dose  . sodium chloride flush (NS) 0.9 % injection 10 mL  10 mL Intravenous PRN Curt Bears, MD        SURGICAL HISTORY:  Past Surgical History:  Procedure Laterality Date  . BACK SURGERY     for pinched nerve, 2011, [facility]  . EYE SURGERY     cataracts removed, ?IOL  . FLEXIBLE BRONCHOSCOPY  11/29/2011  . IR FLUORO GUIDE PORT INSERTION RIGHT  01/30/2017  . IR US GUIDE VASC ACCESS RIGHT  01/30/2017  .  removed blood clot     left upper abd quad   . right kidney removed    . right nephrectomy      REVIEW OF SYSTEMS:   Review of Systems  Constitutional: Negative for appetite change, chills, fatigue, fever and unexpected weight change.  HENT:   Negative for nosebleeds, sore throat and trouble swallowing.  Positive for mouth sore to the left side of her tongue. Eyes: Negative for eye problems and icterus.  Respiratory: Negative for cough, hemoptysis, shortness of breath and wheezing.   Cardiovascular: Negative for chest pain and leg swelling.  Gastrointestinal: Negative  for abdominal pain, constipation, diarrhea, nausea and vomiting.  Genitourinary: Negative for bladder incontinence, difficulty urinating, dysuria, frequency and hematuria.   Musculoskeletal: Negative for back pain, gait problem, neck pain and neck stiffness.  Skin: Negative for itching and rash.  She reports irritation to the right foot. Neurological: Negative for dizziness, extremity weakness, gait problem, headaches, light-headedness and seizures.  Hematological: Negative for adenopathy. Does not bruise/bleed easily.  Psychiatric/Behavioral: Negative for confusion, depression and sleep disturbance. The patient is not nervous/anxious.     PHYSICAL EXAMINATION:  Blood pressure (!) 141/70, pulse (!) 114, temperature 98.8 F (37.1 C), temperature source Oral, resp. rate 12, height 5\' 5"  (1.651 m), weight 144 lb 9.6 oz (65.6 kg), SpO2 97 %.  ECOG PERFORMANCE STATUS: 1 - Symptomatic but completely ambulatory  Physical Exam  Constitutional: Oriented to person, place, and time and well-developed, well-nourished, and in no distress. No distress.  HENT:  Head: Normocephalic and atraumatic.  Mouth/Throat: Oropharynx is moist. No oropharyngeal exudate.  Ulcer noted to her left tongue. Eyes: Conjunctivae are normal. Right eye exhibits no discharge. Left eye exhibits no discharge. No scleral icterus.  Neck: Normal range of motion.  Neck supple.  Cardiovascular: Normal rate, regular rhythm, normal heart sounds and intact distal pulses.   Pulmonary/Chest: Effort normal and breath sounds normal. No respiratory distress. No wheezes. No rales.  Abdominal: Soft. Bowel sounds are normal. Exhibits no distension and no mass. There is no tenderness.  Musculoskeletal: Normal range of motion. Exhibits no edema.  Lymphadenopathy:    No cervical adenopathy.  Neurological: Alert and oriented to person, place, and time. Exhibits normal muscle tone. Gait normal. Coordination normal.  Skin: Skin is warm and dry. No rash noted. Not diaphoretic. No erythema. No pallor.  She has an area of mild irritation to the anterior aspect of her right foot between the great toe and first digit. Psychiatric: Mood, memory and judgment normal.  Vitals reviewed.  LABORATORY DATA: Lab Results  Component Value Date   WBC 9.3 02/11/2018   HGB 10.0 (L) 02/11/2018   HCT 32.5 (L) 02/11/2018   MCV 114.4 (H) 02/11/2018   PLT 179 02/11/2018      Chemistry      Component Value Date/Time   NA 145 02/11/2018 1314   NA 141 03/08/2017 1258   K 3.3 (L) 02/11/2018 1314   K 3.7 03/08/2017 1258   CL 106 02/11/2018 1314   CO2 26 02/11/2018 1314   CO2 23 03/08/2017 1258   BUN 15 02/11/2018 1314   BUN 24.1 03/08/2017 1258   CREATININE 1.14 (H) 02/11/2018 1314   CREATININE 1.4 (H) 03/08/2017 1258      Component Value Date/Time   CALCIUM 8.6 (L) 02/11/2018 1314   CALCIUM 8.8 03/08/2017 1258   ALKPHOS 85 02/11/2018 1314   ALKPHOS 65 03/08/2017 1258   AST 24 02/11/2018 1314   AST 8 03/08/2017 1258   ALT 28 02/11/2018 1314   ALT 20 03/08/2017 1258   BILITOT 0.3 02/11/2018 1314   BILITOT 0.40 03/08/2017 1258       RADIOGRAPHIC STUDIES:  No results found.   ASSESSMENT/PLAN:  Renal cell carcinoma of right kidney [facility]) This is a very pleasant 72 year old African-American female with metastatic renal cell carcinoma that was initially diagnosed in 2010  and now presenting with bilateral pulmonary nodules. She was treated with stereotactic radiotherapy to hypermetabolic nodules without tissue diagnosis in the past. Recent imaging studies showed hypermetabolic and enlarging bilateral pulmonary nodules. CT-guided core biopsy  of right pulmonary nodule was consistent with metastatic renal cell carcinoma. The patient was treated with a combination of ipilimumab and nivolumab for 3 cycles but ipilimumab was discontinued secondary to significant itching that was intolerable for the patient. She was treated with 2 more cycles of single agent nivolumab which was discontinued secondary to disease progression. She is currently undergoing treatment with oral Cabometyx 40 mg p.o. daily.  Status post 7 months of treatment. She continues to tolerate the treatment well.    Recommend for her to continue on her current treatment with Cabometyx 40 mg daily.  She will follow-up in 1 month for evaluation and repeat lab work.  For her mouth sore, she was given a prescription for Magic mouthwash.  For the irritated skin on her right foot, she was advised to use moisturizer at least 2-3 times a day.  The patient has mild hypokalemia on her labs today.  She is not currently taking her K Dur that is on her medication list.  I advised her to take this at least once a day for the next 7 days.  The patient was advised to call immediately if she has any concerning symptoms in the interval. The patient voices understanding of current disease status and treatment options and is in agreement with the current care plan. All questions were answered. The patient knows to call the clinic with any problems, questions or concerns. We can certainly see the patient much sooner if necessary.   No orders of the defined types were placed in this encounter.    Mikey Bussing, DNP, AGPCNP-BC, AOCNP 02/11/18

## 2018-02-26 ENCOUNTER — Other Ambulatory Visit: Payer: Self-pay | Admitting: Internal Medicine

## 2018-02-26 DIAGNOSIS — Z1231 Encounter for screening mammogram for malignant neoplasm of breast: Secondary | ICD-10-CM

## 2018-02-27 MED FILL — CABOMETYX 40 MG TABLET: 40 | 30 days supply | Qty: 30 | Fill #1

## 2018-03-01 ENCOUNTER — Ambulatory Visit
Admission: RE | Admit: 2018-03-01 | Discharge: 2018-03-01 | Disposition: A | Discharge status: Home / Self Care | Payer: Medicare Other | Source: Ambulatory Visit | Attending: Internal Medicine | Admitting: Internal Medicine

## 2018-03-01 DIAGNOSIS — Z1231 Encounter for screening mammogram for malignant neoplasm of breast: Secondary | ICD-10-CM

## 2018-03-11 ENCOUNTER — Telehealth: Payer: Self-pay | Admitting: Medical Oncology

## 2018-03-11 ENCOUNTER — Encounter: Payer: Self-pay | Admitting: Internal Medicine

## 2018-03-11 ENCOUNTER — Inpatient Hospital Stay (HOSPITAL_BASED_OUTPATIENT_CLINIC_OR_DEPARTMENT_OTHER): Payer: Medicare Other | Admitting: Internal Medicine

## 2018-03-11 ENCOUNTER — Inpatient Hospital Stay: Payer: Medicare Other

## 2018-03-11 ENCOUNTER — Inpatient Hospital Stay: Payer: Medicare Other | Attending: Internal Medicine

## 2018-03-11 VITALS — BP 114/70 | HR 110 | Temp 98.7°F | Resp 18 | Ht 65.0 in | Wt 144.2 lb

## 2018-03-11 DIAGNOSIS — E119 Type 2 diabetes mellitus without complications: Secondary | ICD-10-CM

## 2018-03-11 DIAGNOSIS — Z7901 Long term (current) use of anticoagulants: Secondary | ICD-10-CM | POA: Diagnosis not present

## 2018-03-11 DIAGNOSIS — I10 Essential (primary) hypertension: Secondary | ICD-10-CM

## 2018-03-11 DIAGNOSIS — C641 Malignant neoplasm of right kidney, except renal pelvis: Secondary | ICD-10-CM | POA: Insufficient documentation

## 2018-03-11 DIAGNOSIS — Z79899 Other long term (current) drug therapy: Secondary | ICD-10-CM | POA: Diagnosis not present

## 2018-03-11 DIAGNOSIS — C3411 Malignant neoplasm of upper lobe, right bronchus or lung: Secondary | ICD-10-CM

## 2018-03-11 DIAGNOSIS — Z86718 Personal history of other venous thrombosis and embolism: Secondary | ICD-10-CM | POA: Insufficient documentation

## 2018-03-11 DIAGNOSIS — Z5111 Encounter for antineoplastic chemotherapy: Secondary | ICD-10-CM

## 2018-03-11 DIAGNOSIS — Z905 Acquired absence of kidney: Secondary | ICD-10-CM | POA: Diagnosis not present

## 2018-03-11 DIAGNOSIS — Z95828 Presence of other vascular implants and grafts: Secondary | ICD-10-CM

## 2018-03-11 DIAGNOSIS — R918 Other nonspecific abnormal finding of lung field: Secondary | ICD-10-CM

## 2018-03-11 DIAGNOSIS — C649 Malignant neoplasm of unspecified kidney, except renal pelvis: Secondary | ICD-10-CM

## 2018-03-11 LAB — CMP (CANCER CENTER ONLY)
ALBUMIN: 3.3 g/dL — AB (ref 3.5–5.0)
ALT: 26 U/L (ref 0–44)
ANION GAP: 9 (ref 5–15)
AST: 21 U/L (ref 15–41)
Alkaline Phosphatase: 79 U/L (ref 38–126)
BUN: 16 mg/dL (ref 8–23)
CHLORIDE: 106 mmol/L (ref 98–111)
CO2: 26 mmol/L (ref 22–32)
Calcium: 8.3 mg/dL — ABNORMAL LOW (ref 8.9–10.3)
Creatinine: 1 mg/dL (ref 0.44–1.00)
GFR, EST NON AFRICAN AMERICAN: 57 mL/min — AB (ref >60)
GFR, Est AFR Am: >60 mL/min (ref >60)
GLUCOSE: 107 mg/dL — AB (ref 70–99)
Potassium: 3.8 mmol/L (ref 3.5–5.1)
Sodium: 141 mmol/L (ref 135–145)
Total Bilirubin: 0.3 mg/dL (ref 0.3–1.2)
Total Protein: 6.3 g/dL — ABNORMAL LOW (ref 6.5–8.1)

## 2018-03-11 LAB — CBC WITH DIFFERENTIAL (CANCER CENTER ONLY)
ABS IMMATURE GRANULOCYTES: 0.04 10*3/uL (ref 0.00–0.07)
Basophils Absolute: 0 10*3/uL (ref 0.0–0.1)
Basophils Relative: 0 %
Eosinophils Absolute: 0.4 10*3/uL (ref 0.0–0.5)
Eosinophils Relative: 4 %
HCT: 31.4 % — ABNORMAL LOW (ref 36.0–46.0)
HEMOGLOBIN: 9.8 g/dL — AB (ref 12.0–15.0)
IMMATURE GRANULOCYTES: 0 %
LYMPHS PCT: 28 %
Lymphs Abs: 3.4 10*3/uL (ref 0.7–4.0)
MCH: 35.8 pg — ABNORMAL HIGH (ref 26.0–34.0)
MCHC: 31.2 g/dL (ref 30.0–36.0)
MCV: 114.6 fL — ABNORMAL HIGH (ref 80.0–100.0)
MONO ABS: 0.5 10*3/uL (ref 0.1–1.0)
Monocytes Relative: 4 %
NEUTROS ABS: 7.5 10*3/uL (ref 1.7–7.7)
NEUTROS PCT: 64 %
PLATELETS: 183 10*3/uL (ref 150–400)
RBC: 2.74 MIL/uL — AB (ref 3.87–5.11)
RDW: 15.5 % (ref 11.5–15.5)
WBC: 11.9 10*3/uL — AB (ref 4.0–10.5)
nRBC: 0 % (ref 0.0–0.2)

## 2018-03-11 MED ORDER — HEPARIN SOD (PORK) LOCK FLUSH 100 UNIT/ML IV SOLN
500.0000 [IU] | Freq: Once | INTRAVENOUS | Status: AC | PRN
  Administered 2018-03-11: 500 [IU] via INTRAVENOUS
  Filled 2018-03-11: qty 5

## 2018-03-11 MED ORDER — SODIUM CHLORIDE 0.9% FLUSH
10.0000 mL | INTRAVENOUS | Status: DC | PRN
  Administered 2018-03-11: 10 mL via INTRAVENOUS
  Filled 2018-03-11: qty 10

## 2018-03-11 NOTE — Telephone Encounter (Signed)
Called Dr Roland Earl and requested refill for Primidone and send to [facility] [address].

## 2018-03-11 NOTE — Progress Notes (Signed)
[place] [phone]   [phone]  OFFICE PROGRESS NOTE  [name], MD [address] [place] [address]  DIAGNOSIS:  1) Metastatic renal cell carcinoma, clear cell type, WHO nuclear grade 3 initially diagnosed in September 2010 and recently presented with bilateral pulmonary nodules. 2) history of renal cell carcinoma status post right nephrectomy in September 2010.  PRIOR THERAPY: 1)Stereotactic radiotherapy to pulmonary nodules based on the metabolic activity with no tissue diagnosis. This was performed under the care of Dr. Pablo Ledger. 2)first line treatment with immunotherapy with Nivolumab 3 MG/KG and Ipilumumab 1 mg/Kg every 3 weeks for 4 cycles followed by maintenance Nivolumab every 3 weeks. First dose 12/27/2016. Status post 3cycles.Stopped due to severe itching. 3) Nivolumab480 mg IV every 4 weeksas a single agentstartingon 05/03/2017.Status post2cycles.  Discontinued secondary to disease progression.  CURRENT THERAPY: Cabometyx 40 mg daily.  First dose started on 07/04/2017.  Status post 8 months of treatment.  INTERVAL HISTORY: Melanie Best 72 y.o. female returns to the clinic today for follow-up visit accompanied by her husband.  The patient is feeling fine today with no concerning complaints except for fatigue and tremor of her head.  She is currently on Primidone by Dr. Deforest Hoyles but she is running out of her medication.  She denied having any current chest pain, shortness breath, cough or hemoptysis.  She denied having any nausea, vomiting, diarrhea or constipation.  She has no headache or visual changes.  She continues to tolerate her treatment with Cabometyx fairly well.  The patient is here today for evaluation and repeat blood work.  MEDICAL HISTORY: Past Medical History:  Diagnosis Date  . Anemia   . Anxiety   . Arthritis    R leg  . Cancer ([place])    kidney  . Chronic fatigue 12/13/2016    . CKD (chronic kidney disease), stage II    removed R kidney- 2010- for Cancer, followed by Dr. Diona Fanti  . COPD (chronic obstructive pulmonary disease) (HCC)    uses O2-2 liters , continuously , followed by Dr. Chase Caller  . Cough 08-02-2015  . Diabetes mellitus, type 2 ([place])   . DM (diabetes mellitus) ([place]) 01/27/2012  . DVT (deep venous thrombosis), left    2011- treated /w coumadin   . Dyslipidemia   . Dysrhythmia   . Esophageal reflux   . GERD (gastroesophageal reflux disease) 01/27/2012  . Glaucoma   . Goals of care, counseling/discussion 12/13/2016  . Hemoptysis 08-02-15  . History of radiation therapy 01/09/12,01/11/12,01/15/12,01/17/12,& 01/19/12   rul lung,50Gy/30fx  . HTN (hypertension) 01/27/2012  . Hyperlipidemia 01/27/2012  . Hypertension    sees Dr. Deforest Hoyles for PCP, denies ever having stress or ECHO, ?when & where she had  last  EKG  . Leukocytosis   . Lung cancer ([place])    NSCLC  . Osteopenia   . Renal cell carcinoma of right kidney ([place]) 12/13/2016  . Shortness of breath   . Syncope   . Tremor   . Urinary incontinence   . Vitamin D deficiency     ALLERGIES:  has No Known Allergies.  MEDICATIONS:  Current Outpatient Medications  Medication Sig Dispense Refill  . bimatoprost (LUMIGAN) 0.01 % SOLN Place 1 drop into both eyes at bedtime.     . brinzolamide (AZOPT) 1 % ophthalmic suspension Place 1 drop into both eyes 3 (three) times daily. Reported on 08/12/2015    . CABOMETYX 40 MG tablet TAKE  1 TABLET (40MG ) BY MOUTH ONCE DAILY. TAKE ON AN EMPTY STOMACH 1 HOUR BEFORE OR 2 HOURS AFTER MEALS 30 tablet 1  . CALCIUM PO Take by mouth daily.    . clonazePAM (KLONOPIN) 1 MG tablet Take 1 mg by mouth 2 (two) times daily as needed. For anxiety    . diltiazem (CARDIZEM CD) 180 MG 24 hr capsule Take 180 mg by mouth daily.  0  . ergocalciferol (VITAMIN D2) 50000 UNITS capsule Take 50,000 Units by mouth once a week. Take on Monday    . esomeprazole (NEXIUM) 40 MG capsule Take 40  mg by mouth daily before breakfast.     . FLUAD 0.5 ML SUSY ADM 0.5ML IM UTD  0  . furosemide (LASIX) 20 MG tablet Take 20 mg by mouth daily as needed. For fluid retention    . hydrocortisone cream 1 % Apply 1 application topically 2 (two) times daily. 30 g 0  . ipratropium-albuterol (DUONEB) 0.5-2.5 (3) MG/3ML SOLN Take 3 mLs by nebulization 4 (four) times daily as needed. 360 mL 4  . JANUVIA 50 MG tablet Take 50 mg by mouth daily.     Marland Kitchen lidocaine-prilocaine (EMLA) cream Apply 1 application topically as needed.    . lovastatin (MEVACOR) 20 MG tablet Take 20 mg by mouth at bedtime.    . magic mouthwash w/lidocaine SOLN Take 5 mLs by mouth 4 (four) times daily as needed for mouth pain. Swish and spit before meals and at bedtime. 480 mL 0  . methocarbamol (ROBAXIN) 750 MG tablet Take 750 mg by mouth 2 (two) times daily as needed.  0  . mupirocin ointment (BACTROBAN) 2 % Place 1 application into the nose 2 (two) times daily. 30 g 1  . OXYGEN Inhale 2 L into the lungs continuous.    . Potassium Chloride ER 20 MEQ TBCR TAKE 1 TABLET BY MOUTH TWICE DAILY 20 tablet 0  . predniSONE (DELTASONE) 5 MG tablet Take 4 mg by mouth daily with breakfast.     . primidone (MYSOLINE) 50 MG tablet Take 100 mg by mouth 2 (two) times daily.     . promethazine (PHENERGAN) 25 MG tablet take 1 tablet by mouth every 6 hours if needed for nausea  0  . traMADol (ULTRAM) 50 MG tablet Take 1 tablet by mouth 3 (three) times daily as needed.  1  . XARELTO 20 MG TABS tablet Take 20 mg by mouth daily.  2   No current facility-administered medications for this visit.    Facility-Administered Medications Ordered in Other Visits  Medication Dose Route Frequency Provider Last Rate Last Dose  . sodium chloride flush (NS) 0.9 % injection 10 mL  10 mL Intravenous PRN Curt Bears, MD        SURGICAL HISTORY:  Past Surgical History:  Procedure Laterality Date  . BACK SURGERY     for pinched nerve, 2011, here at [facility]  . EYE  SURGERY     cataracts removed, ?IOL  . FLEXIBLE BRONCHOSCOPY  11/29/2011  . IR FLUORO GUIDE PORT INSERTION RIGHT  01/30/2017  . IR US GUIDE VASC ACCESS RIGHT  01/30/2017  . removed blood clot     left upper abd quad   . right kidney removed    . right nephrectomy      REVIEW OF SYSTEMS:  A comprehensive review of systems was negative except for: Constitutional: positive for fatigue Musculoskeletal: positive for muscle weakness   PHYSICAL EXAMINATION: General appearance: alert, cooperative, fatigued  and no distress Head: Normocephalic, without obvious abnormality, atraumatic Neck: no adenopathy, no JVD, supple, symmetrical, trachea midline and thyroid not enlarged, symmetric, no tenderness/mass/nodules Lymph nodes: Cervical, supraclavicular, and axillary nodes normal. Resp: clear to auscultation bilaterally Back: symmetric, no curvature. ROM normal. No CVA tenderness. Cardio: regular rate and rhythm, S1, S2 normal, no murmur, click, rub or gallop GI: soft, non-tender; bowel sounds normal; no masses,  no organomegaly Extremities: extremities normal, atraumatic, no cyanosis or edema  ECOG PERFORMANCE STATUS: 1 - Symptomatic but completely ambulatory  Blood pressure 114/70, pulse (!) 110, temperature 98.7 F (37.1 C), temperature source Oral, resp. rate 18, height 5\' 5"  (1.651 m), weight 144 lb 3.2 oz (65.4 kg), SpO2 92 %.  LABORATORY DATA: Lab Results  Component Value Date   WBC 11.9 (H) 03/11/2018   HGB 9.8 (L) 03/11/2018   HCT 31.4 (L) 03/11/2018   MCV 114.6 (H) 03/11/2018   PLT 183 03/11/2018      Chemistry      Component Value Date/Time   NA 141 03/11/2018 1427   NA 141 03/08/2017 1258   K 3.8 03/11/2018 1427   K 3.7 03/08/2017 1258   CL 106 03/11/2018 1427   CO2 26 03/11/2018 1427   CO2 23 03/08/2017 1258   BUN 16 03/11/2018 1427   BUN 24.1 03/08/2017 1258   CREATININE 1.00 03/11/2018 1427   CREATININE 1.4 (H) 03/08/2017 1258      Component Value Date/Time    CALCIUM 8.3 (L) 03/11/2018 1427   CALCIUM 8.8 03/08/2017 1258   ALKPHOS 79 03/11/2018 1427   ALKPHOS 65 03/08/2017 1258   AST 21 03/11/2018 1427   AST 8 03/08/2017 1258   ALT 26 03/11/2018 1427   ALT 20 03/08/2017 1258   BILITOT 0.3 03/11/2018 1427   BILITOT 0.40 03/08/2017 1258       RADIOGRAPHIC STUDIES: Mm Digital Screening Bilateral  Result Date: 03/04/2018 CLINICAL DATA:  Screening. EXAM: DIGITAL SCREENING BILATERAL MAMMOGRAM WITH CAD COMPARISON:  Previous exam(s). ACR Breast Density Category b: There are scattered areas of fibroglandular density. FINDINGS: There are no findings suspicious for malignancy. Images were processed with CAD. IMPRESSION: No mammographic evidence of malignancy. A result letter of this screening mammogram will be mailed directly to the patient. RECOMMENDATION: Screening mammogram in one year. (Code:SM-B-01Y) BI-RADS CATEGORY  1: Negative. Electronically Signed   By: Lajean Manes M.D.   On: 03/04/2018 09:03    ASSESSMENT AND PLAN: This is a very pleasant 72 years old African-American female with metastatic renal cell carcinoma that was initially diagnosed in 2010 and now presenting with bilateral pulmonary nodules. She was treated with stereotactic radiotherapy to hypermetabolic nodules without tissue diagnosis in the past. Recent imaging studies showed hypermetabolic and enlarging bilateral pulmonary nodules. CT-guided core biopsy of right pulmonary nodule was consistent with metastatic renal cell carcinoma. The patient was treated with a combination of ipilimumab and nivolumab for 3 cycles but ipilimumab was discontinued secondary to significant itching that was intolerable for the patient.  She was treated with 2 more cycles of single agent nivolumab which was discontinued secondary to disease progression. She is currently undergoing treatment with oral Cabometyx 40 mg p.o. Daily.  Status post 8 months of treatment. She has been tolerating this treatment  well with no concerning adverse effects. I recommended for her to continue her current treatment with Cabometyx with the same dose. I will see her back for follow-up visit in 1 months for evaluation after repeating CT scan of the  chest, abdomen and pelvis for restaging of her disease. She will have Port-A-Cath flush every 6 weeks. The patient was advised to call immediately if she has any concerning symptoms in the interval. The patient voices understanding of current disease status and treatment options and is in agreement with the current care plan. All questions were answered. The patient knows to call the clinic with any problems, questions or concerns. We can certainly see the patient much sooner if necessary.  Disclaimer: This note was dictated with voice recognition software. Similar sounding words can inadvertently be transcribed and may not be corrected upon review.

## 2018-03-19 ENCOUNTER — Telehealth: Payer: Self-pay

## 2018-03-19 ENCOUNTER — Other Ambulatory Visit: Payer: Self-pay | Admitting: Internal Medicine

## 2018-03-19 DIAGNOSIS — C641 Malignant neoplasm of right kidney, except renal pelvis: Secondary | ICD-10-CM

## 2018-03-19 NOTE — Telephone Encounter (Signed)
Oral Oncology Patient Advocate Encounter  Prior Authorization for Cabometyx has been approved.    PA# [id number] Effective dates: 03/19/18 through 03/20/19  Oral Oncology Clinic will continue to follow.   [place] Patient [place] Phone [phone] Fax [phone]

## 2018-03-19 NOTE — Telephone Encounter (Signed)
Oral Oncology Patient Advocate Encounter  Received notification from [place] that the existing prior authorization for Cabometyx is due for renewal.  Renewal PA submitted on CoverMyMeds Key [id number] Status is pending  Oral Oncology Clinic will continue to follow.  [place] Patient [place] Phone [phone] Fax [phone]

## 2018-04-01 MED FILL — CABOMETYX 40 MG TABLET: 40 | 30 days supply | Qty: 30 | Fill #0

## 2018-04-08 ENCOUNTER — Other Ambulatory Visit: Payer: Medicare Other

## 2018-04-09 ENCOUNTER — Other Ambulatory Visit: Payer: Medicare Other

## 2018-04-09 ENCOUNTER — Ambulatory Visit (HOSPITAL_COMMUNITY)
Admission: RE | Admit: 2018-04-09 | Discharge: 2018-04-09 | Disposition: A | Discharge status: Home / Self Care | Payer: Medicare Other | Source: Ambulatory Visit | Attending: Internal Medicine | Admitting: Internal Medicine

## 2018-04-09 ENCOUNTER — Ambulatory Visit: Payer: Medicare Other | Admitting: Internal Medicine

## 2018-04-09 DIAGNOSIS — C3411 Malignant neoplasm of upper lobe, right bronchus or lung: Secondary | ICD-10-CM | POA: Insufficient documentation

## 2018-04-11 ENCOUNTER — Other Ambulatory Visit: Payer: Self-pay | Admitting: Internal Medicine

## 2018-04-11 DIAGNOSIS — M810 Age-related osteoporosis without current pathological fracture: Secondary | ICD-10-CM

## 2018-04-12 ENCOUNTER — Inpatient Hospital Stay: Payer: Medicare Other | Attending: Internal Medicine

## 2018-04-12 ENCOUNTER — Inpatient Hospital Stay: Payer: Medicare Other

## 2018-04-12 DIAGNOSIS — I10 Essential (primary) hypertension: Secondary | ICD-10-CM | POA: Insufficient documentation

## 2018-04-12 DIAGNOSIS — E876 Hypokalemia: Secondary | ICD-10-CM | POA: Diagnosis not present

## 2018-04-12 DIAGNOSIS — C3411 Malignant neoplasm of upper lobe, right bronchus or lung: Secondary | ICD-10-CM

## 2018-04-12 DIAGNOSIS — C7801 Secondary malignant neoplasm of right lung: Secondary | ICD-10-CM | POA: Insufficient documentation

## 2018-04-12 DIAGNOSIS — Z95828 Presence of other vascular implants and grafts: Secondary | ICD-10-CM

## 2018-04-12 DIAGNOSIS — C641 Malignant neoplasm of right kidney, except renal pelvis: Secondary | ICD-10-CM | POA: Diagnosis not present

## 2018-04-12 LAB — CBC WITH DIFFERENTIAL (CANCER CENTER ONLY)
Abs Immature Granulocytes: 0.02 10*3/uL (ref 0.00–0.07)
Basophils Absolute: 0 10*3/uL (ref 0.0–0.1)
Basophils Relative: 0 %
EOS PCT: 3 %
Eosinophils Absolute: 0.3 10*3/uL (ref 0.0–0.5)
HCT: 31.3 % — ABNORMAL LOW (ref 36.0–46.0)
Hemoglobin: 9.8 g/dL — ABNORMAL LOW (ref 12.0–15.0)
Immature Granulocytes: 0 %
LYMPHS PCT: 36 %
Lymphs Abs: 3.5 10*3/uL (ref 0.7–4.0)
MCH: 36.8 pg — ABNORMAL HIGH (ref 26.0–34.0)
MCHC: 31.3 g/dL (ref 30.0–36.0)
MCV: 117.7 fL — ABNORMAL HIGH (ref 80.0–100.0)
Monocytes Absolute: 0.4 10*3/uL (ref 0.1–1.0)
Monocytes Relative: 4 %
Neutro Abs: 5.7 10*3/uL (ref 1.7–7.7)
Neutrophils Relative %: 57 %
Platelet Count: 178 10*3/uL (ref 150–400)
RBC: 2.66 MIL/uL — ABNORMAL LOW (ref 3.87–5.11)
RDW: 15.5 % (ref 11.5–15.5)
WBC Count: 9.9 10*3/uL (ref 4.0–10.5)
nRBC: 0 % (ref 0.0–0.2)

## 2018-04-12 LAB — CMP (CANCER CENTER ONLY)
ALT: 18 U/L (ref 0–44)
AST: 14 U/L — ABNORMAL LOW (ref 15–41)
Albumin: 3.1 g/dL — ABNORMAL LOW (ref 3.5–5.0)
Alkaline Phosphatase: 71 U/L (ref 38–126)
Anion gap: 8 (ref 5–15)
BUN: 12 mg/dL (ref 8–23)
CO2: 31 mmol/L (ref 22–32)
CREATININE: 0.99 mg/dL (ref 0.44–1.00)
Calcium: 8.4 mg/dL — ABNORMAL LOW (ref 8.9–10.3)
Chloride: 104 mmol/L (ref 98–111)
GFR, Est AFR Am: >60 mL/min (ref >60)
GFR, Estimated: 58 mL/min — ABNORMAL LOW (ref >60)
Glucose, Bld: 121 mg/dL — ABNORMAL HIGH (ref 70–99)
Potassium: 3.1 mmol/L — ABNORMAL LOW (ref 3.5–5.1)
Sodium: 143 mmol/L (ref 135–145)
Total Bilirubin: 0.2 mg/dL — ABNORMAL LOW (ref 0.3–1.2)
Total Protein: 6 g/dL — ABNORMAL LOW (ref 6.5–8.1)

## 2018-04-12 MED ORDER — HEPARIN SOD (PORK) LOCK FLUSH 100 UNIT/ML IV SOLN
500.0000 [IU] | Freq: Once | INTRAVENOUS | Status: AC | PRN
  Administered 2018-04-12: 500 [IU] via INTRAVENOUS
  Filled 2018-04-12: qty 5

## 2018-04-12 MED ORDER — SODIUM CHLORIDE 0.9% FLUSH
10.0000 mL | INTRAVENOUS | Status: DC | PRN
  Administered 2018-04-12: 10 mL via INTRAVENOUS
  Filled 2018-04-12: qty 10

## 2018-04-15 ENCOUNTER — Telehealth: Payer: Self-pay | Admitting: *Deleted

## 2018-04-15 NOTE — Telephone Encounter (Signed)
Notified pt to increase potassium in her diet.

## 2018-04-16 ENCOUNTER — Encounter: Payer: Self-pay | Admitting: Internal Medicine

## 2018-04-16 ENCOUNTER — Inpatient Hospital Stay (HOSPITAL_BASED_OUTPATIENT_CLINIC_OR_DEPARTMENT_OTHER): Payer: Medicare Other | Admitting: Internal Medicine

## 2018-04-16 VITALS — BP 130/79 | HR 104 | Temp 98.6°F | Resp 18 | Ht 65.0 in | Wt 143.1 lb

## 2018-04-16 DIAGNOSIS — E876 Hypokalemia: Secondary | ICD-10-CM

## 2018-04-16 DIAGNOSIS — N289 Disorder of kidney and ureter, unspecified: Secondary | ICD-10-CM

## 2018-04-16 DIAGNOSIS — C7801 Secondary malignant neoplasm of right lung: Secondary | ICD-10-CM | POA: Diagnosis not present

## 2018-04-16 DIAGNOSIS — R531 Weakness: Secondary | ICD-10-CM

## 2018-04-16 DIAGNOSIS — I10 Essential (primary) hypertension: Secondary | ICD-10-CM

## 2018-04-16 DIAGNOSIS — C649 Malignant neoplasm of unspecified kidney, except renal pelvis: Secondary | ICD-10-CM

## 2018-04-16 DIAGNOSIS — C641 Malignant neoplasm of right kidney, except renal pelvis: Secondary | ICD-10-CM | POA: Diagnosis not present

## 2018-04-16 DIAGNOSIS — R5383 Other fatigue: Secondary | ICD-10-CM

## 2018-04-16 NOTE — Progress Notes (Signed)
[place] [phone]   [phone]  OFFICE PROGRESS NOTE  [name], MD [address] [place] [address]  DIAGNOSIS:  1) Metastatic renal cell carcinoma, clear cell type, WHO nuclear grade 3 initially diagnosed in September 2010 and recently presented with bilateral pulmonary nodules. 2) history of renal cell carcinoma status post right nephrectomy in September 2010.  PRIOR THERAPY: 1)Stereotactic radiotherapy to pulmonary nodules based on the metabolic activity with no tissue diagnosis. This was performed under the care of Dr. Pablo Ledger. 2)first line treatment with immunotherapy with Nivolumab 3 MG/KG and Ipilumumab 1 mg/Kg every 3 weeks for 4 cycles followed by maintenance Nivolumab every 3 weeks. First dose 12/27/2016. Status post 3cycles.Stopped due to severe itching. 3) Nivolumab480 mg IV every 4 weeksas a single agentstartingon 05/03/2017.Status post2cycles.  Discontinued secondary to disease progression.  CURRENT THERAPY: Cabometyx 40 mg daily.  First dose started on 07/04/2017.  Status post 9 months of treatment.  INTERVAL HISTORY: Melanie Best 73 y.o. female returns to the clinic today for follow-up visit accompanied by her husband.  The patient is feeling fine today with no concerning complaints except for mild fatigue and weakness in her lower extremities.  She always comes to the clinic on a wheelchair.  She denied having any current chest pain, shortness of breath, cough or hemoptysis.  She has no nausea, vomiting, diarrhea or constipation.  She denied having any fever or chills.  She has no significant weight loss or night sweats.  The patient continues to tolerate her treatment with Cabometyx fairly well.  She had repeat CT scan of the chest, abdomen and pelvis performed recently and she is here for evaluation and discussion of her scan results.  MEDICAL HISTORY: Past Medical History:  Diagnosis  Date  . Anemia   . Anxiety   . Arthritis    R leg  . Cancer ([facility])    kidney  . Chronic fatigue 12/13/2016  . CKD (chronic kidney disease), stage II    removed R kidney- 2010- for Cancer, followed by Dr. Diona Fanti  . COPD (chronic obstructive pulmonary disease) ([facility])    uses O2-2 liters , continuously , followed by Dr. Chase Caller  . Cough 08-02-2015  . Diabetes mellitus, type 2 ([facility])   . DM (diabetes mellitus) ([facility]) 01/27/2012  . DVT (deep venous thrombosis), left    2011- treated /w coumadin   . Dyslipidemia   . Dysrhythmia   . Esophageal reflux   . GERD (gastroesophageal reflux disease) 01/27/2012  . Glaucoma   . Goals of care, counseling/discussion 12/13/2016  . Hemoptysis 08-02-15  . History of radiation therapy 01/09/12,01/11/12,01/15/12,01/17/12,& 01/19/12   rul lung,50Gy/80fx  . HTN (hypertension) 01/27/2012  . Hyperlipidemia 01/27/2012  . Hypertension    sees Dr. Deforest Hoyles for PCP, denies ever having stress or ECHO, ?when & where she had  last  EKG  . Leukocytosis   . Lung cancer ([facility])    NSCLC  . Osteopenia   . Renal cell carcinoma of right kidney ([facility]) 12/13/2016  . Shortness of breath   . Syncope   . Tremor   . Urinary incontinence   . Vitamin D deficiency     ALLERGIES:  has No Known Allergies.  MEDICATIONS:  Current Outpatient Medications  Medication Sig Dispense Refill  . bimatoprost (LUMIGAN) 0.01 % SOLN Place 1 drop into both eyes at bedtime.     . brinzolamide (AZOPT) 1 % ophthalmic suspension Place 1 drop  into both eyes 3 (three) times daily. Reported on 08/12/2015    . CABOMETYX 40 MG tablet TAKE 1 TABLET (40MG ) BY MOUTH ONCE DAILY. TAKE ON AN EMPTY STOMACH 1 HOUR BEFORE OR 2 HOURS AFTER MEALS 30 tablet 1  . CALCIUM PO Take by mouth daily.    . clonazePAM (KLONOPIN) 1 MG tablet Take 1 mg by mouth 2 (two) times daily as needed. For anxiety    . diltiazem (CARDIZEM CD) 180 MG 24 hr capsule Take 180 mg by mouth daily.  0  . ergocalciferol (VITAMIN D2) 50000 UNITS  capsule Take 50,000 Units by mouth once a week. Take on Monday    . esomeprazole (NEXIUM) 40 MG capsule Take 40 mg by mouth daily before breakfast.     . FLUAD 0.5 ML SUSY ADM 0.5ML IM UTD  0  . furosemide (LASIX) 20 MG tablet Take 20 mg by mouth daily as needed. For fluid retention    . hydrocortisone cream 1 % Apply 1 application topically 2 (two) times daily. 30 g 0  . ipratropium-albuterol (DUONEB) 0.5-2.5 (3) MG/3ML SOLN Take 3 mLs by nebulization 4 (four) times daily as needed. 360 mL 4  . JANUVIA 50 MG tablet Take 50 mg by mouth daily.     Marland Kitchen lidocaine-prilocaine (EMLA) cream Apply 1 application topically as needed.    . lovastatin (MEVACOR) 20 MG tablet Take 20 mg by mouth at bedtime.    . magic mouthwash w/lidocaine SOLN Take 5 mLs by mouth 4 (four) times daily as needed for mouth pain. Swish and spit before meals and at bedtime. 480 mL 0  . methocarbamol (ROBAXIN) 750 MG tablet Take 750 mg by mouth 2 (two) times daily as needed.  0  . mupirocin ointment (BACTROBAN) 2 % Place 1 application into the nose 2 (two) times daily. 30 g 1  . OXYGEN Inhale 2 L into the lungs continuous.    . Potassium Chloride ER 20 MEQ TBCR TAKE 1 TABLET BY MOUTH TWICE DAILY 20 tablet 0  . predniSONE (DELTASONE) 5 MG tablet Take 4 mg by mouth daily with breakfast.     . primidone (MYSOLINE) 50 MG tablet Take 100 mg by mouth 2 (two) times daily.     . promethazine (PHENERGAN) 25 MG tablet take 1 tablet by mouth every 6 hours if needed for nausea  0  . traMADol (ULTRAM) 50 MG tablet Take 1 tablet by mouth 3 (three) times daily as needed.  1  . XARELTO 20 MG TABS tablet Take 20 mg by mouth daily.  2  . KLOR-CON M10 10 MEQ tablet      No current facility-administered medications for this visit.    Facility-Administered Medications Ordered in Other Visits  Medication Dose Route Frequency Provider Last Rate Last Dose  . sodium chloride flush (NS) 0.9 % injection 10 mL  10 mL Intravenous PRN Curt Bears, MD         SURGICAL HISTORY:  Past Surgical History:  Procedure Laterality Date  . BACK SURGERY     for pinched nerve, 2011, here at [facility]  . EYE SURGERY     cataracts removed, ?IOL  . FLEXIBLE BRONCHOSCOPY  11/29/2011  . IR FLUORO GUIDE PORT INSERTION RIGHT  01/30/2017  . IR US GUIDE VASC ACCESS RIGHT  01/30/2017  . removed blood clot     left upper abd quad   . right kidney removed    . right nephrectomy      REVIEW OF  SYSTEMS:  Constitutional: positive for fatigue Eyes: negative Ears, nose, mouth, throat, and face: negative Respiratory: negative Cardiovascular: negative Gastrointestinal: negative Genitourinary:negative Integument/breast: negative Hematologic/lymphatic: negative Musculoskeletal:positive for muscle weakness Neurological: negative Behavioral/Psych: negative Endocrine: negative Allergic/Immunologic: negative   PHYSICAL EXAMINATION: General appearance: alert, cooperative, fatigued and no distress Head: Normocephalic, without obvious abnormality, atraumatic Neck: no adenopathy, no JVD, supple, symmetrical, trachea midline and thyroid not enlarged, symmetric, no tenderness/mass/nodules Lymph nodes: Cervical, supraclavicular, and axillary nodes normal. Resp: clear to auscultation bilaterally Back: symmetric, no curvature. ROM normal. No CVA tenderness. Cardio: regular rate and rhythm, S1, S2 normal, no murmur, click, rub or gallop GI: soft, non-tender; bowel sounds normal; no masses,  no organomegaly Extremities: extremities normal, atraumatic, no cyanosis or edema Neurologic: Alert and oriented X 3, normal strength and tone. Normal symmetric reflexes. Normal coordination and gait  ECOG PERFORMANCE STATUS: 1 - Symptomatic but completely ambulatory  Blood pressure 130/79, pulse (!) 104, temperature 98.6 F (37 C), temperature source Oral, resp. rate 18, height 5\' 5"  (1.651 m), weight 143 lb 1.6 oz (64.9 kg), SpO2 95 %.  LABORATORY DATA: Lab Results  Component  Value Date   WBC 9.9 04/12/2018   HGB 9.8 (L) 04/12/2018   HCT 31.3 (L) 04/12/2018   MCV 117.7 (H) 04/12/2018   PLT 178 04/12/2018      Chemistry      Component Value Date/Time   NA 143 04/12/2018 1115   NA 141 03/08/2017 1258   K 3.1 (L) 04/12/2018 1115   K 3.7 03/08/2017 1258   CL 104 04/12/2018 1115   CO2 31 04/12/2018 1115   CO2 23 03/08/2017 1258   BUN 12 04/12/2018 1115   BUN 24.1 03/08/2017 1258   CREATININE 0.99 04/12/2018 1115   CREATININE 1.4 (H) 03/08/2017 1258      Component Value Date/Time   CALCIUM 8.4 (L) 04/12/2018 1115   CALCIUM 8.8 03/08/2017 1258   ALKPHOS 71 04/12/2018 1115   ALKPHOS 65 03/08/2017 1258   AST 14 (L) 04/12/2018 1115   AST 8 03/08/2017 1258   ALT 18 04/12/2018 1115   ALT 20 03/08/2017 1258   BILITOT 0.2 (L) 04/12/2018 1115   BILITOT 0.40 03/08/2017 1258       RADIOGRAPHIC STUDIES: Ct Abdomen Pelvis Wo Contrast  Result Date: 04/09/2018 CLINICAL DATA:  Bilateral renal cancer with right nephrectomy, restaging. Currently on oral chemotherapy. EXAM: CT CHEST, ABDOMEN AND PELVIS WITHOUT CONTRAST TECHNIQUE: Multidetector CT imaging of the chest, abdomen and pelvis was performed following the standard protocol without IV contrast. COMPARISON:  01/07/2018 FINDINGS: CT CHEST FINDINGS Cardiovascular: Right Port-A-Cath tip: SVC. Coronary, aortic arch, and branch vessel atherosclerotic vascular disease. Small primarily anterior pericardial effusion, mildly increased from prior. Mediastinum/Nodes: No definite pathologic adenopathy identified. Lungs/Pleura: Severe emphysema. Bandlike density in the right upper lobe measuring up to 10 mm in thickness on image 48/4, previously up to 11 mm, with similar morphology to prior. Stable 3 mm nodule in the posterior basal segment right lower lobe on image 127/4. Stable lingular scarring. Stable minimal left apical nodularity. Roughly stable left lower lobe peribronchovascular scarring and airspace opacity with  truncation of left lower lobe segmental bronchi as well as peribronchovascular nodularity. The bandlike airspace opacity measures about 2.4 cm in thickness, previously measured at 2.5 cm. A peribronchovascular nodule measures 1.0 by 0.8 cm on image 99/4, formerly 1.1 by 0.8 cm, roughly stable. Musculoskeletal: Unremarkable CT ABDOMEN PELVIS FINDINGS Hepatobiliary: Dependent density in the gallbladder favoring gallstones. Otherwise unremarkable noncontrast CT  appearance of the liver. Pancreas: Unremarkable Spleen: Unremarkable Adrenals/Urinary Tract: The large mass of the left kidney lower pole is only slightly hyperdense compared to the rest of the kidney, and because of this size measurements may have some in accuracy. I estimate lesion size at 5.6 by 4.8 cm on image 73/2 which is roughly stable when I measure in the same fashion on the prior exam. Prominent thickness of the left renal vein at 1.9 cm, stable from before and probably reflecting tumor thrombus in the left renal vein. Several hyperdense left renal lesions separate from the dominant mass are probably complex cysts. Urinary bladder unremarkable. No adrenal mass. Right nephrectomy. Stomach/Bowel: There is a suggestion of wall thickening in the cecum, and tumor is not readily excluded. No appendiceal dilatation. Vascular/Lymphatic: Aortoiliac atherosclerotic vascular disease. No appreciable pathologic retroperitoneal adenopathy. Stable thickened appearance of the left renal vein likely from tumor thrombus is noted above. Reproductive: Unremarkable Other: No supplemental non-categorized findings. Musculoskeletal: Degenerative disc disease and spondylosis at the L4-5 and L5-S1 levels. Grade 1 degenerative retrolisthesis at L4-5. 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 IMPRESSION: 1. The 5.6 cm left kidney lower pole mass, abnormal thickening of the left renal vein compatible with tumor thrombus, and left lower lobe  peribronchovascular nodules are stable compared to recent prior exams. 2. Small anterior pericardial effusion, mildly increased from prior. 3. Wall thickening in the cecum which could be from peristalsis or tumor. Correlation with the patient's colon cancer screening history is recommended. If screening is not up-to-date, appropriate screening should be considered. 4. Other imaging findings of potential clinical significance: Aortic Atherosclerosis (ICD10-I70.0). Coronary artery atherosclerosis. Emphysema (ICD10-J43.9). Stable lingular scarring. Cholelithiasis. Lower lumbar spondylosis and degenerative disc disease. 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 Electronically Signed   By: Van Clines M.D.   On: 04/09/2018 18:08   Ct Chest Wo Contrast  Result Date: 04/09/2018 CLINICAL DATA:  Bilateral renal cancer with right nephrectomy, restaging. Currently on oral chemotherapy. EXAM: CT CHEST, ABDOMEN AND PELVIS WITHOUT CONTRAST TECHNIQUE: Multidetector CT imaging of the chest, abdomen and pelvis was performed following the standard protocol without IV contrast. COMPARISON:  01/07/2018 FINDINGS: CT CHEST FINDINGS Cardiovascular: Right Port-A-Cath tip: SVC. Coronary, aortic arch, and branch vessel atherosclerotic vascular disease. Small primarily anterior pericardial effusion, mildly increased from prior. Mediastinum/Nodes: No definite pathologic adenopathy identified. Lungs/Pleura: Severe emphysema. Bandlike density in the right upper lobe measuring up to 10 mm in thickness on image 48/4, previously up to 11 mm, with similar morphology to prior. Stable 3 mm nodule in the posterior basal segment right lower lobe on image 127/4. Stable lingular scarring. Stable minimal left apical nodularity. Roughly stable left lower lobe peribronchovascular scarring and airspace opacity with truncation of left lower lobe segmental bronchi as well as peribronchovascular nodularity. The bandlike airspace opacity  measures about 2.4 cm in thickness, previously measured at 2.5 cm. A peribronchovascular nodule measures 1.0 by 0.8 cm on image 99/4, formerly 1.1 by 0.8 cm, roughly stable. Musculoskeletal: Unremarkable CT ABDOMEN PELVIS FINDINGS Hepatobiliary: Dependent density in the gallbladder favoring gallstones. Otherwise unremarkable noncontrast CT appearance of the liver. Pancreas: Unremarkable Spleen: Unremarkable Adrenals/Urinary Tract: The large mass of the left kidney lower pole is only slightly hyperdense compared to the rest of the kidney, and because of this size measurements may have some in accuracy. I estimate lesion size at 5.6 by 4.8 cm on image 73/2 which is roughly stable when I measure in the same  fashion on the prior exam. Prominent thickness of the left renal vein at 1.9 cm, stable from before and probably reflecting tumor thrombus in the left renal vein. Several hyperdense left renal lesions separate from the dominant mass are probably complex cysts. Urinary bladder unremarkable. No adrenal mass. Right nephrectomy. Stomach/Bowel: There is a suggestion of wall thickening in the cecum, and tumor is not readily excluded. No appendiceal dilatation. Vascular/Lymphatic: Aortoiliac atherosclerotic vascular disease. No appreciable pathologic retroperitoneal adenopathy. Stable thickened appearance of the left renal vein likely from tumor thrombus is noted above. Reproductive: Unremarkable Other: No supplemental non-categorized findings. Musculoskeletal: Degenerative disc disease and spondylosis at the L4-5 and L5-S1 levels. Grade 1 degenerative retrolisthesis at L4-5. 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 IMPRESSION: 1. The 5.6 cm left kidney lower pole mass, abnormal thickening of the left renal vein compatible with tumor thrombus, and left lower lobe peribronchovascular nodules are stable compared to recent prior exams. 2. Small anterior pericardial effusion, mildly increased from  prior. 3. Wall thickening in the cecum which could be from peristalsis or tumor. Correlation with the patient's colon cancer screening history is recommended. If screening is not up-to-date, appropriate screening should be considered. 4. Other imaging findings of potential clinical significance: Aortic Atherosclerosis (ICD10-I70.0). Coronary artery atherosclerosis. Emphysema (ICD10-J43.9). Stable lingular scarring. Cholelithiasis. Lower lumbar spondylosis and degenerative disc disease. 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 Electronically Signed   By: Van Clines M.D.   On: 04/09/2018 18:08    ASSESSMENT AND PLAN: This is a very pleasant 73 years old African-American female with metastatic renal cell carcinoma that was initially diagnosed in 2010 and now presenting with bilateral pulmonary nodules. She was treated with stereotactic radiotherapy to hypermetabolic nodules without tissue diagnosis in the past. Recent imaging studies showed hypermetabolic and enlarging bilateral pulmonary nodules. CT-guided core biopsy of right pulmonary nodule was consistent with metastatic renal cell carcinoma. The patient was treated with a combination of ipilimumab and nivolumab for 3 cycles but ipilimumab was discontinued secondary to significant itching that was intolerable for the patient.  She was treated with 2 more cycles of single agent nivolumab which was discontinued secondary to disease progression. She is currently undergoing treatment with oral Cabometyx 40 mg p.o. Daily.  Status post 9 months of treatment. The patient has been tolerating this treatment well with no concerning adverse effects. She had repeat CT scan of the chest, abdomen and pelvis performed recently.  I personally and independently reviewed the scan images and discussed the results with the patient and her husband. Her scan showed no concerning findings for disease progression. I recommended for the patient to continue  her current treatment with Cabometyx with the same dose. For the hypertension, the patient will continue her current blood pressure medication. For the hypokalemia, this is secondary to her current treatment with diuretics.  She was advised to continue with the potassium supplement and increase her oral intake of potassium rich diet. I will see her back for follow-up visit in 1 months for evaluation with repeat blood work. The patient was advised to call immediately if she has any concerning symptoms in the interval. The patient voices understanding of current disease status and treatment options and is in agreement with the current care plan. All questions were answered. The patient knows to call the clinic with any problems, questions or concerns. We can certainly see the patient much sooner if necessary.  Disclaimer: This note was dictated with voice  recognition software. Similar sounding words can inadvertently be transcribed and may not be corrected upon review.

## 2018-04-29 ENCOUNTER — Inpatient Hospital Stay: Payer: Medicare Other | Attending: Internal Medicine

## 2018-05-06 ENCOUNTER — Other Ambulatory Visit: Payer: Medicare Other

## 2018-05-07 MED FILL — CABOMETYX 40 MG TABLET: 40 | 30 days supply | Qty: 30 | Fill #1

## 2018-05-10 ENCOUNTER — Other Ambulatory Visit: Payer: Medicare Other

## 2018-05-20 ENCOUNTER — Telehealth: Payer: Self-pay | Admitting: Internal Medicine

## 2018-05-20 ENCOUNTER — Encounter: Payer: Self-pay | Admitting: Internal Medicine

## 2018-05-20 ENCOUNTER — Inpatient Hospital Stay: Payer: Medicare Other

## 2018-05-20 ENCOUNTER — Inpatient Hospital Stay: Payer: Medicare Other | Attending: Internal Medicine | Admitting: Internal Medicine

## 2018-05-20 VITALS — BP 114/65 | HR 100 | Temp 98.6°F | Resp 18 | Ht 65.0 in | Wt 136.3 lb

## 2018-05-20 DIAGNOSIS — C641 Malignant neoplasm of right kidney, except renal pelvis: Secondary | ICD-10-CM | POA: Diagnosis not present

## 2018-05-20 DIAGNOSIS — C7801 Secondary malignant neoplasm of right lung: Secondary | ICD-10-CM | POA: Insufficient documentation

## 2018-05-20 DIAGNOSIS — F329 Major depressive disorder, single episode, unspecified: Secondary | ICD-10-CM | POA: Diagnosis not present

## 2018-05-20 DIAGNOSIS — R630 Anorexia: Secondary | ICD-10-CM

## 2018-05-20 DIAGNOSIS — R634 Abnormal weight loss: Secondary | ICD-10-CM | POA: Insufficient documentation

## 2018-05-20 DIAGNOSIS — R5383 Other fatigue: Secondary | ICD-10-CM | POA: Insufficient documentation

## 2018-05-20 DIAGNOSIS — R0602 Shortness of breath: Secondary | ICD-10-CM | POA: Diagnosis not present

## 2018-05-20 DIAGNOSIS — C649 Malignant neoplasm of unspecified kidney, except renal pelvis: Secondary | ICD-10-CM

## 2018-05-20 DIAGNOSIS — Z9981 Dependence on supplemental oxygen: Secondary | ICD-10-CM | POA: Diagnosis not present

## 2018-05-20 DIAGNOSIS — Z5111 Encounter for antineoplastic chemotherapy: Secondary | ICD-10-CM

## 2018-05-20 DIAGNOSIS — C3432 Malignant neoplasm of lower lobe, left bronchus or lung: Secondary | ICD-10-CM

## 2018-05-20 LAB — CBC WITH DIFFERENTIAL (CANCER CENTER ONLY)
ABS IMMATURE GRANULOCYTES: 0.02 10*3/uL (ref 0.00–0.07)
BASOS ABS: 0 10*3/uL (ref 0.0–0.1)
Basophils Relative: 0 %
Eosinophils Absolute: 0.3 10*3/uL (ref 0.0–0.5)
Eosinophils Relative: 3 %
HCT: 31.7 % — ABNORMAL LOW (ref 36.0–46.0)
Hemoglobin: 9.9 g/dL — ABNORMAL LOW (ref 12.0–15.0)
Immature Granulocytes: 0 %
Lymphocytes Relative: 48 %
Lymphs Abs: 4.4 10*3/uL — ABNORMAL HIGH (ref 0.7–4.0)
MCH: 37.2 pg — ABNORMAL HIGH (ref 26.0–34.0)
MCHC: 31.2 g/dL (ref 30.0–36.0)
MCV: 119.2 fL — ABNORMAL HIGH (ref 80.0–100.0)
Monocytes Absolute: 0.4 10*3/uL (ref 0.1–1.0)
Monocytes Relative: 4 %
NEUTROS PCT: 45 %
NRBC: 0 % (ref 0.0–0.2)
Neutro Abs: 4.2 10*3/uL (ref 1.7–7.7)
Platelet Count: 166 10*3/uL (ref 150–400)
RBC: 2.66 MIL/uL — ABNORMAL LOW (ref 3.87–5.11)
RDW: 15 % (ref 11.5–15.5)
WBC Count: 9.3 10*3/uL (ref 4.0–10.5)

## 2018-05-20 LAB — CMP (CANCER CENTER ONLY)
ALBUMIN: 3.3 g/dL — AB (ref 3.5–5.0)
ALT: 22 U/L (ref 0–44)
AST: 23 U/L (ref 15–41)
Alkaline Phosphatase: 79 U/L (ref 38–126)
Anion gap: 10 (ref 5–15)
BUN: 11 mg/dL (ref 8–23)
CO2: 25 mmol/L (ref 22–32)
Calcium: 8.3 mg/dL — ABNORMAL LOW (ref 8.9–10.3)
Chloride: 106 mmol/L (ref 98–111)
Creatinine: 1.04 mg/dL — ABNORMAL HIGH (ref 0.44–1.00)
GFR, Est AFR Am: >60 mL/min (ref >60)
GFR, Estimated: 54 mL/min — ABNORMAL LOW (ref >60)
Glucose, Bld: 118 mg/dL — ABNORMAL HIGH (ref 70–99)
Potassium: 3.6 mmol/L (ref 3.5–5.1)
Sodium: 141 mmol/L (ref 135–145)
Total Bilirubin: 0.3 mg/dL (ref 0.3–1.2)
Total Protein: 6.5 g/dL (ref 6.5–8.1)

## 2018-05-20 MED ORDER — MIRTAZAPINE 30 MG PO TABS: 30.0000 mg | ORAL_TABLET | Freq: Every day | ORAL | 2 refills | Status: DC

## 2018-05-20 NOTE — Telephone Encounter (Signed)
Gave avs and calendar ° °

## 2018-05-20 NOTE — Progress Notes (Signed)
[facility] [phone]) [phone]   Fax:([phone]) [phone]  OFFICE PROGRESS NOTE  Melanie Low, MD [address] [place] [address]  DIAGNOSIS:  1) Metastatic renal cell carcinoma, clear cell type, WHO nuclear grade 3 initially diagnosed in September 2010 and recently presented with bilateral pulmonary nodules. 2) history of renal cell carcinoma status post right nephrectomy in September 2010.  PRIOR THERAPY: 1)Stereotactic radiotherapy to pulmonary nodules based on the metabolic activity with no tissue diagnosis. This was performed under the care of Dr. Pablo Ledger. 2)first line treatment with immunotherapy with Nivolumab 3 MG/KG and Ipilumumab 1 mg/Kg every 3 weeks for 4 cycles followed by maintenance Nivolumab every 3 weeks. First dose 12/27/2016. Status post 3cycles.Stopped due to severe itching. 3) Nivolumab480 mg IV every 4 weeksas a single agentstartingon 05/03/2017.Status post2cycles.  Discontinued secondary to disease progression.  CURRENT THERAPY: Cabometyx 40 mg daily.  First dose started on 07/04/2017.  Status post 10 months of treatment.  INTERVAL HISTORY: Melanie Best 73 y.o. female returns to the clinic today for follow-up visit accompanied by her husband.  The patient is feeling fine today with no concerning complaints except for the baseline fatigue and shortness of breath.  She is currently on home oxygen.  She lost around 7 pounds since her last visit because of lack of appetite.  She denied having any chest pain, cough or hemoptysis.  She denied having any nausea, vomiting, diarrhea or constipation.  She continues to tolerate her treatment with Cabometyx fairly well.  The patient is here today for evaluation and repeat blood work.  MEDICAL HISTORY: Past Medical History:  Diagnosis Date  . Anemia   . Anxiety   . Arthritis    R leg  . Cancer ([place])    kidney  . Chronic fatigue 12/13/2016  . CKD (chronic kidney  disease), stage II    removed R kidney- 2010- for Cancer, followed by Dr. Diona Fanti  . COPD (chronic obstructive pulmonary disease) ([facility])    uses O2-2 liters , continuously , followed by Dr. Chase Caller  . Cough 08-02-2015  . Diabetes mellitus, type 2 ([facility])   . DM (diabetes mellitus) ([facility]) 01/27/2012  . DVT (deep venous thrombosis), left    2011- treated /w coumadin   . Dyslipidemia   . Dysrhythmia   . Esophageal reflux   . GERD (gastroesophageal reflux disease) 01/27/2012  . Glaucoma   . Goals of care, counseling/discussion 12/13/2016  . Hemoptysis 08-02-15  . History of radiation therapy 01/09/12,01/11/12,01/15/12,01/17/12,& 01/19/12   rul lung,50Gy/50fx  . HTN (hypertension) 01/27/2012  . Hyperlipidemia 01/27/2012  . Hypertension    sees Dr. Deforest Hoyles for PCP, denies ever having stress or ECHO, ?when & where she had  last  EKG  . Leukocytosis   . Lung cancer ([facility])    NSCLC  . Osteopenia   . Renal cell carcinoma of right kidney ([facility]) 12/13/2016  . Shortness of breath   . Syncope   . Tremor   . Urinary incontinence   . Vitamin D deficiency     ALLERGIES:  has No Known Allergies.  MEDICATIONS:  Current Outpatient Medications  Medication Sig Dispense Refill  . bimatoprost (LUMIGAN) 0.01 % SOLN Place 1 drop into both eyes at bedtime.     . brinzolamide (AZOPT) 1 % ophthalmic suspension Place 1 drop into both eyes 3 (three) times daily. Reported on 08/12/2015    . CABOMETYX 40 MG tablet TAKE 1 TABLET (40MG ) BY MOUTH ONCE  DAILY. TAKE ON AN EMPTY STOMACH 1 HOUR BEFORE OR 2 HOURS AFTER MEALS 30 tablet 1  . CALCIUM PO Take by mouth daily.    . clonazePAM (KLONOPIN) 1 MG tablet Take 1 mg by mouth 2 (two) times daily as needed. For anxiety    . diltiazem (CARDIZEM CD) 180 MG 24 hr capsule Take 180 mg by mouth daily.  0  . ergocalciferol (VITAMIN D2) 50000 UNITS capsule Take 50,000 Units by mouth once a week. Take on Monday    . esomeprazole (NEXIUM) 40 MG capsule Take 40 mg by mouth daily  before breakfast.     . FLUAD 0.5 ML SUSY ADM 0.5ML IM UTD  0  . furosemide (LASIX) 20 MG tablet Take 20 mg by mouth daily as needed. For fluid retention    . hydrocortisone cream 1 % Apply 1 application topically 2 (two) times daily. 30 g 0  . ipratropium-albuterol (DUONEB) 0.5-2.5 (3) MG/3ML SOLN Take 3 mLs by nebulization 4 (four) times daily as needed. 360 mL 4  . JANUVIA 50 MG tablet Take 50 mg by mouth daily.     Marland Kitchen KLOR-CON M10 10 MEQ tablet     . lidocaine-prilocaine (EMLA) cream Apply 1 application topically as needed.    . lovastatin (MEVACOR) 20 MG tablet Take 20 mg by mouth at bedtime.    . magic mouthwash w/lidocaine SOLN Take 5 mLs by mouth 4 (four) times daily as needed for mouth pain. Swish and spit before meals and at bedtime. 480 mL 0  . methocarbamol (ROBAXIN) 750 MG tablet Take 750 mg by mouth 2 (two) times daily as needed.  0  . mupirocin ointment (BACTROBAN) 2 % Place 1 application into the nose 2 (two) times daily. 30 g 1  . OXYGEN Inhale 2 L into the lungs continuous.    . Potassium Chloride ER 20 MEQ TBCR TAKE 1 TABLET BY MOUTH TWICE DAILY 20 tablet 0  . predniSONE (DELTASONE) 5 MG tablet Take 4 mg by mouth daily with breakfast.     . primidone (MYSOLINE) 50 MG tablet Take 100 mg by mouth 2 (two) times daily.     . promethazine (PHENERGAN) 25 MG tablet take 1 tablet by mouth every 6 hours if needed for nausea  0  . traMADol (ULTRAM) 50 MG tablet Take 1 tablet by mouth 3 (three) times daily as needed.  1  . XARELTO 20 MG TABS tablet Take 20 mg by mouth daily.  2   No current facility-administered medications for this visit.    Facility-Administered Medications Ordered in Other Visits  Medication Dose Route Frequency Provider Last Rate Last Dose  . sodium chloride flush (NS) 0.9 % injection 10 mL  10 mL Intravenous PRN Curt Bears, MD        SURGICAL HISTORY:  Past Surgical History:  Procedure Laterality Date  . BACK SURGERY     for pinched nerve, 2011, here  at [facility]  . EYE SURGERY     cataracts removed, ?IOL  . FLEXIBLE BRONCHOSCOPY  11/29/2011  . IR FLUORO GUIDE PORT INSERTION RIGHT  01/30/2017  . IR US GUIDE VASC ACCESS RIGHT  01/30/2017  . removed blood clot     left upper abd quad   . right kidney removed    . right nephrectomy      REVIEW OF SYSTEMS:  A comprehensive review of systems was negative except for: Constitutional: positive for anorexia, fatigue and weight loss Respiratory: positive for dyspnea on exertion  PHYSICAL EXAMINATION: General appearance: alert, cooperative, fatigued and no distress Head: Normocephalic, without obvious abnormality, atraumatic Neck: no adenopathy, no JVD, supple, symmetrical, trachea midline and thyroid not enlarged, symmetric, no tenderness/mass/nodules Lymph nodes: Cervical, supraclavicular, and axillary nodes normal. Resp: clear to auscultation bilaterally Back: symmetric, no curvature. ROM normal. No CVA tenderness. Cardio: regular rate and rhythm, S1, S2 normal, no murmur, click, rub or gallop GI: soft, non-tender; bowel sounds normal; no masses,  no organomegaly Extremities: extremities normal, atraumatic, no cyanosis or edema  ECOG PERFORMANCE STATUS: 1 - Symptomatic but completely ambulatory  Blood pressure 114/65, pulse 100, temperature 98.6 F (37 C), temperature source Oral, resp. rate 18, height 5\' 5"  (1.651 m), weight 136 lb 4.8 oz (61.8 kg), SpO2 98 %.  LABORATORY DATA: Lab Results  Component Value Date   WBC 9.3 05/20/2018   HGB 9.9 (L) 05/20/2018   HCT 31.7 (L) 05/20/2018   MCV 119.2 (H) 05/20/2018   PLT 166 05/20/2018      Chemistry      Component Value Date/Time   NA 143 04/12/2018 1115   NA 141 03/08/2017 1258   K 3.1 (L) 04/12/2018 1115   K 3.7 03/08/2017 1258   CL 104 04/12/2018 1115   CO2 31 04/12/2018 1115   CO2 23 03/08/2017 1258   BUN 12 04/12/2018 1115   BUN 24.1 03/08/2017 1258   CREATININE 0.99 04/12/2018 1115   CREATININE 1.4 (H) 03/08/2017 1258       Component Value Date/Time   CALCIUM 8.4 (L) 04/12/2018 1115   CALCIUM 8.8 03/08/2017 1258   ALKPHOS 71 04/12/2018 1115   ALKPHOS 65 03/08/2017 1258   AST 14 (L) 04/12/2018 1115   AST 8 03/08/2017 1258   ALT 18 04/12/2018 1115   ALT 20 03/08/2017 1258   BILITOT 0.2 (L) 04/12/2018 1115   BILITOT 0.40 03/08/2017 1258       RADIOGRAPHIC STUDIES: No results found.  ASSESSMENT AND PLAN: This is a very pleasant 72 years old African-American female with metastatic renal cell carcinoma that was initially diagnosed in 2010 and now presenting with bilateral pulmonary nodules. She was treated with stereotactic radiotherapy to hypermetabolic nodules without tissue diagnosis in the past. Recent imaging studies showed hypermetabolic and enlarging bilateral pulmonary nodules. CT-guided core biopsy of right pulmonary nodule was consistent with metastatic renal cell carcinoma. The patient was treated with a combination of ipilimumab and nivolumab for 3 cycles but ipilimumab was discontinued secondary to significant itching that was intolerable for the patient.  She was treated with 2 more cycles of single agent nivolumab which was discontinued secondary to disease progression. She is currently undergoing treatment with oral Cabometyx 40 mg p.o. Daily.  Status post 10 months of treatment. The patient continues to tolerate her treatment well with no concerning adverse effects. I recommended for her to continue her current treatment with Cabometyx at the same dose. For the lack of appetite and depression, I will start the patient on Remeron 30 mg p.o. nightly. I will see the patient back for follow-up visit in 1 months for evaluation and repeat blood work. She was advised to call immediately if she has any other concerning symptoms in the interval. The patient voices understanding of current disease status and treatment options and is in agreement with the current care plan. All questions were answered.  The patient knows to call the clinic with any problems, questions or concerns. We can certainly see the patient much sooner if necessary.  Disclaimer: This note  was dictated with voice recognition software. Similar sounding words can inadvertently be transcribed and may not be corrected upon review.

## 2018-05-30 ENCOUNTER — Other Ambulatory Visit: Payer: Self-pay | Admitting: Internal Medicine

## 2018-05-30 DIAGNOSIS — C641 Malignant neoplasm of right kidney, except renal pelvis: Secondary | ICD-10-CM

## 2018-06-03 ENCOUNTER — Other Ambulatory Visit: Payer: Medicare Other

## 2018-06-10 ENCOUNTER — Telehealth: Payer: Self-pay | Admitting: Internal Medicine

## 2018-06-10 ENCOUNTER — Inpatient Hospital Stay: Payer: Medicare Other

## 2018-06-10 MED FILL — CABOMETYX 40 MG TABLET: 40 | 30 days supply | Qty: 30 | Fill #0

## 2018-06-10 NOTE — Telephone Encounter (Signed)
Returned call to patient re cancelling 3/23 port flush. Flush moved to 4//6 with lab/fu. Spoke with patient.

## 2018-06-21 ENCOUNTER — Telehealth: Payer: Self-pay | Admitting: Medical Oncology

## 2018-06-21 ENCOUNTER — Other Ambulatory Visit: Payer: Self-pay | Admitting: Physician Assistant

## 2018-06-21 DIAGNOSIS — C641 Malignant neoplasm of right kidney, except renal pelvis: Secondary | ICD-10-CM

## 2018-06-21 NOTE — Telephone Encounter (Signed)
Pt wants to postpone labs and appt to end of month . She states she feels fine and is not experiencing any pain,nausea,vomiting,diarrhea, cough ,hemoptysis. She does not want to go out of the house for anything. I instructe dhre to call for any concerns or problems.I sent schedule message.

## 2018-06-21 NOTE — Telephone Encounter (Signed)
Wants to pospone appts to may

## 2018-06-24 ENCOUNTER — Telehealth: Payer: Self-pay | Admitting: Internal Medicine

## 2018-06-24 ENCOUNTER — Other Ambulatory Visit: Payer: Medicare Other

## 2018-06-24 ENCOUNTER — Ambulatory Visit: Payer: Medicare Other | Admitting: Internal Medicine

## 2018-06-24 NOTE — Telephone Encounter (Signed)
Scheduled per sch msg. Called and spoke with patient. Confirmed date and time

## 2018-06-26 ENCOUNTER — Other Ambulatory Visit: Payer: Medicare Other

## 2018-06-26 ENCOUNTER — Encounter: Payer: Self-pay | Admitting: General Practice

## 2018-06-26 NOTE — Progress Notes (Signed)
[place] Team contacted patient to assess for food insecurity and other psychosocial needs during current COVID19 pandemic.    Patient/family expressed no needs at this time.  Support Team member encouraged patient to call if changes occur or they have any other questions/concerns.    Beverely Pace, [place]

## 2018-07-10 ENCOUNTER — Telehealth: Payer: Self-pay

## 2018-07-10 MED FILL — CABOMETYX 40 MG TABLET: 40 | 30 days supply | Qty: 30 | Fill #1

## 2018-07-10 NOTE — Telephone Encounter (Signed)
Oral Oncology Patient Advocate Encounter  [place] notified me that the patients [employer] and [employer] grant were rejecting. Both grants have expired.   Patient has been conditionally approved for copay assistance with [employer] (TAF).    Current enrollment expires on 08/09/18.  The patient will receive an enrollment application in the mail to complete and return to TAF.  This application's submission is required for the patient's continued enrollment after 08/09/18.  I have discussed this with the patient.   When fully enrolled, The Assistance Fund will cover all copayment expenses for Cabometyx for the remainder of the calendar year.    The billing information is as follows and has been shared with [place].  Member ID: [id number] Group ID: [id number] PCN: [id number] BIN: [id number]   I will continue to follow up for full enrollment status.   [place] Patient [place] Phone [phone] Fax [phone] 07/10/2018    10:42 AM

## 2018-07-23 ENCOUNTER — Telehealth: Payer: Self-pay | Admitting: Medical Oncology

## 2018-07-23 ENCOUNTER — Ambulatory Visit: Payer: Medicare Other | Admitting: Internal Medicine

## 2018-07-23 ENCOUNTER — Inpatient Hospital Stay: Payer: Medicare Other

## 2018-07-23 NOTE — Telephone Encounter (Signed)
Pt cancelled appt ." I just don" feel good".  She denies fever,vomiting.  She is taking her Cabometyx . Schedule message sent to R/S appt.

## 2018-07-24 ENCOUNTER — Telehealth: Payer: Self-pay | Admitting: Internal Medicine

## 2018-07-24 NOTE — Telephone Encounter (Signed)
Scheduled appt per sch msg. Called and spoke with patient. Confirmed date and time °

## 2018-07-31 ENCOUNTER — Other Ambulatory Visit: Payer: Self-pay | Admitting: Internal Medicine

## 2018-07-31 DIAGNOSIS — C641 Malignant neoplasm of right kidney, except renal pelvis: Secondary | ICD-10-CM

## 2018-07-31 NOTE — Telephone Encounter (Signed)
Pt will need refill on this when she comes in tomorrow.  Drucie Ip, RN

## 2018-08-01 ENCOUNTER — Other Ambulatory Visit: Payer: Self-pay

## 2018-08-01 ENCOUNTER — Encounter: Payer: Self-pay | Admitting: Physician Assistant

## 2018-08-01 ENCOUNTER — Inpatient Hospital Stay: Payer: Medicare Other

## 2018-08-01 ENCOUNTER — Other Ambulatory Visit: Payer: Self-pay | Admitting: Medical Oncology

## 2018-08-01 ENCOUNTER — Inpatient Hospital Stay: Payer: Medicare Other | Attending: Internal Medicine | Admitting: Physician Assistant

## 2018-08-01 VITALS — BP 118/73 | HR 86 | Temp 98.7°F | Resp 18 | Ht 65.0 in | Wt 129.4 lb

## 2018-08-01 DIAGNOSIS — R5383 Other fatigue: Secondary | ICD-10-CM | POA: Insufficient documentation

## 2018-08-01 DIAGNOSIS — E876 Hypokalemia: Secondary | ICD-10-CM | POA: Diagnosis not present

## 2018-08-01 DIAGNOSIS — Z95828 Presence of other vascular implants and grafts: Secondary | ICD-10-CM

## 2018-08-01 DIAGNOSIS — C641 Malignant neoplasm of right kidney, except renal pelvis: Secondary | ICD-10-CM | POA: Insufficient documentation

## 2018-08-01 DIAGNOSIS — Z5111 Encounter for antineoplastic chemotherapy: Secondary | ICD-10-CM

## 2018-08-01 DIAGNOSIS — R1013 Epigastric pain: Secondary | ICD-10-CM

## 2018-08-01 DIAGNOSIS — C7801 Secondary malignant neoplasm of right lung: Secondary | ICD-10-CM | POA: Diagnosis not present

## 2018-08-01 DIAGNOSIS — R531 Weakness: Secondary | ICD-10-CM | POA: Diagnosis not present

## 2018-08-01 LAB — CBC WITH DIFFERENTIAL (CANCER CENTER ONLY)
Abs Immature Granulocytes: 0.02 10*3/uL (ref 0.00–0.07)
Basophils Absolute: 0 10*3/uL (ref 0.0–0.1)
Basophils Relative: 0 %
Eosinophils Absolute: 0 10*3/uL (ref 0.0–0.5)
Eosinophils Relative: 0 %
HCT: 31 % — ABNORMAL LOW (ref 36.0–46.0)
Hemoglobin: 9.5 g/dL — ABNORMAL LOW (ref 12.0–15.0)
Immature Granulocytes: 0 %
Lymphocytes Relative: 19 %
Lymphs Abs: 1.6 10*3/uL (ref 0.7–4.0)
MCH: 37 pg — ABNORMAL HIGH (ref 26.0–34.0)
MCHC: 30.6 g/dL (ref 30.0–36.0)
MCV: 120.6 fL — ABNORMAL HIGH (ref 80.0–100.0)
Monocytes Absolute: 0.3 10*3/uL (ref 0.1–1.0)
Monocytes Relative: 3 %
Neutro Abs: 6.6 10*3/uL (ref 1.7–7.7)
Neutrophils Relative %: 78 %
Platelet Count: 163 10*3/uL (ref 150–400)
RBC: 2.57 MIL/uL — ABNORMAL LOW (ref 3.87–5.11)
RDW: 16.4 % — ABNORMAL HIGH (ref 11.5–15.5)
WBC Count: 8.5 10*3/uL (ref 4.0–10.5)
nRBC: 0.5 % — ABNORMAL HIGH (ref 0.0–0.2)

## 2018-08-01 LAB — CMP (CANCER CENTER ONLY)
ALT: 23 U/L (ref 0–44)
AST: 33 U/L (ref 15–41)
Albumin: 3.1 g/dL — ABNORMAL LOW (ref 3.5–5.0)
Alkaline Phosphatase: 75 U/L (ref 38–126)
Anion gap: 12 (ref 5–15)
BUN: 10 mg/dL (ref 8–23)
CO2: 30 mmol/L (ref 22–32)
Calcium: 8 mg/dL — ABNORMAL LOW (ref 8.9–10.3)
Chloride: 101 mmol/L (ref 98–111)
Creatinine: 1.04 mg/dL — ABNORMAL HIGH (ref 0.44–1.00)
GFR, Est AFR Am: >60 mL/min (ref >60)
GFR, Estimated: 54 mL/min — ABNORMAL LOW (ref >60)
Glucose, Bld: 163 mg/dL — ABNORMAL HIGH (ref 70–99)
Potassium: 2.9 mmol/L — CL (ref 3.5–5.1)
Sodium: 143 mmol/L (ref 135–145)
Total Bilirubin: 0.4 mg/dL (ref 0.3–1.2)
Total Protein: 6.3 g/dL — ABNORMAL LOW (ref 6.5–8.1)

## 2018-08-01 MED ORDER — SODIUM CHLORIDE 0.9% FLUSH
10.0000 mL | INTRAVENOUS | Status: DC | PRN
  Administered 2018-08-01: 10 mL via INTRAVENOUS
  Filled 2018-08-01: qty 10

## 2018-08-01 MED ORDER — HEPARIN SOD (PORK) LOCK FLUSH 100 UNIT/ML IV SOLN
500.0000 [IU] | Freq: Once | INTRAVENOUS | Status: AC | PRN
  Administered 2018-08-01: 500 [IU] via INTRAVENOUS
  Filled 2018-08-01: qty 5

## 2018-08-01 MED ORDER — POTASSIUM CHLORIDE ER 20 MEQ PO TBCR: 1.0000 | EXTENDED_RELEASE_TABLET | Freq: Two times a day (BID) | ORAL | 0 refills | Status: DC

## 2018-08-01 NOTE — Progress Notes (Signed)
[facility] OFFICE PROGRESS NOTE  [name], MD [address] [place] [address]  DIAGNOSIS:  1) Metastatic renal cell carcinoma, clear cell type, WHO nuclear grade 3 initially diagnosed in September 2010 and recently presented with bilateral pulmonary nodules. 2) history of renal cell carcinoma status post right nephrectomy in September 2010.  PRIOR THERAPY:  1)Stereotactic radiotherapy to pulmonary nodules based on the metabolic activity with no tissue diagnosis. This was performed under the care of Dr. Pablo Ledger. 2)first line treatment with immunotherapy with Nivolumab 3 MG/KG and Ipilumumab 1 mg/Kg every 3 weeks for 4 cycles followed by maintenance Nivolumab every 3 weeks. First dose 12/27/2016. Status post 3cycles.Stopped due to severe itching. 3)Nivolumab480 mg IV every 4 weeksas a single agentstartingon 05/03/2017.Status post2cycles.Discontinued secondary to disease progression.  CURRENT THERAPY: Cabometyx 40 mg daily.First dose started on 07/04/2017.  Status post 13 months of treatment.  INTERVAL HISTORY: Melanie Best 73 y.o. female returns to the clinic for follow-up visit.  The patient is feeling fair today without any concerning complaints except for epigastric pain.  The patient associates her pain with taking her potassium supplements.  The patient is prescribed potassium by her primary care provider. The patient states that she has recently stopped taking her potassium medicine since it exacerbates her abdominal pain.  However, she also notes that she believes Cabometyx is also contributing to her pain as well even though she has been on this for several months.  She states that she takes her medicine at 6 AM every day and then develops achy epigastric pain that lasts approximately 6 hours before subsiding.  She denies any other associated symptoms or activities that aggravate the pain such as position, food intake, etc. She  denies any associated nausea or vomiting.  She denies any significant constipation or diarrhea.  Her last bowel movement was this morning was of normal color and consistency.  At her last visit, the patient had been complaining of weight loss and diminished appetite.  She was subsequently started on Remeron.  The patient states that she has not noticed a significant change since starting this treatment.  Otherwise she feels fair today.  She denies any fever, chills, or night sweats.  She denies any chest pain, shortness of breath, cough, or hemoptysis.  She is here today for evaluation and repeat blood work.  MEDICAL HISTORY: Past Medical History:  Diagnosis Date  . Anemia   . Anxiety   . Arthritis    R leg  . Cancer ([facility])    kidney  . Chronic fatigue 12/13/2016  . CKD (chronic kidney disease), stage II    removed R kidney- 2010- for Cancer, followed by Dr. Diona Fanti  . COPD (chronic obstructive pulmonary disease) ([facility])    uses O2-2 liters , continuously , followed by Dr. Chase Caller  . Cough 08-02-2015  . Diabetes mellitus, type 2 ([facility])   . DM (diabetes mellitus) ([facility]) 01/27/2012  . DVT (deep venous thrombosis), left    2011- treated /w coumadin   . Dyslipidemia   . Dysrhythmia   . Esophageal reflux   . GERD (gastroesophageal reflux disease) 01/27/2012  . Glaucoma   . Goals of care, counseling/discussion 12/13/2016  . Hemoptysis 08-02-15  . History of radiation therapy 01/09/12,01/11/12,01/15/12,01/17/12,& 01/19/12   rul lung,50Gy/26fx  . HTN (hypertension) 01/27/2012  . Hyperlipidemia 01/27/2012  . Hypertension    sees Dr. Deforest Hoyles for PCP, denies ever having stress or ECHO, ?when & where she had  last  EKG  . Leukocytosis   . Lung cancer ([facility])    NSCLC  . Osteopenia   . Renal cell carcinoma of right kidney ([facility] [place] [facility]) 12/13/2016  . Shortness of breath   . Syncope   . Tremor   . Urinary incontinence   . Vitamin D deficiency     ALLERGIES:  has No Known Allergies.  MEDICATIONS:   Current Outpatient Medications  Medication Sig Dispense Refill  . bimatoprost (LUMIGAN) 0.01 % SOLN Place 1 drop into both eyes at bedtime.     . brinzolamide (AZOPT) 1 % ophthalmic suspension Place 1 drop into both eyes 3 (three) times daily. Reported on 08/12/2015    . CABOMETYX 40 MG tablet TAKE 1 TABLET (40MG ) BY MOUTH ONCE DAILY. TAKE ON AN EMPTY STOMACH 1 HOUR BEFORE OR 2 HOURS AFTER MEALS 30 tablet 1  . CALCIUM PO Take by mouth daily.    . clonazePAM (KLONOPIN) 1 MG tablet Take 1 mg by mouth 2 (two) times daily as needed. For anxiety    . diltiazem (CARDIZEM CD) 180 MG 24 hr capsule Take 180 mg by mouth daily.  0  . ergocalciferol (VITAMIN D2) 50000 UNITS capsule Take 50,000 Units by mouth once a week. Take on Monday    . esomeprazole (NEXIUM) 40 MG capsule Take 40 mg by mouth daily before breakfast.     . FLUAD 0.5 ML SUSY ADM 0.5ML IM UTD  0  . furosemide (LASIX) 20 MG tablet Take 20 mg by mouth daily as needed. For fluid retention    . hydrocortisone cream 1 % Apply 1 application topically 2 (two) times daily. 30 g 0  . ipratropium-albuterol (DUONEB) 0.5-2.5 (3) MG/3ML SOLN Take 3 mLs by nebulization 4 (four) times daily as needed. 360 mL 4  . JANUVIA 50 MG tablet Take 50 mg by mouth daily.     Marland Kitchen lidocaine-prilocaine (EMLA) cream Apply 1 application topically as needed.    . lovastatin (MEVACOR) 20 MG tablet Take 20 mg by mouth at bedtime.    . magic mouthwash w/lidocaine SOLN Take 5 mLs by mouth 4 (four) times daily as needed for mouth pain. Swish and spit before meals and at bedtime. 480 mL 0  . methocarbamol (ROBAXIN) 750 MG tablet Take 750 mg by mouth 2 (two) times daily as needed.  0  . mirtazapine (REMERON) 30 MG tablet Take 1 tablet (30 mg total) by mouth at bedtime. 30 tablet 2  . mupirocin ointment (BACTROBAN) 2 % Place 1 application into the nose 2 (two) times daily. 30 g 1  . OXYGEN Inhale 2 L into the lungs continuous.    . Potassium Chloride ER 20 MEQ TBCR Take 1 tablet  by mouth 2 (two) times daily for 7 days. 14 tablet 0  . predniSONE (DELTASONE) 5 MG tablet Take 4 mg by mouth daily with breakfast.     . primidone (MYSOLINE) 50 MG tablet Take 100 mg by mouth 2 (two) times daily.     . promethazine (PHENERGAN) 25 MG tablet take 1 tablet by mouth every 6 hours if needed for nausea  0  . traMADol (ULTRAM) 50 MG tablet Take 1 tablet by mouth 3 (three) times daily as needed.  1  . XARELTO 20 MG TABS tablet Take 20 mg by mouth daily.  2  . KLOR-CON M10 10 MEQ tablet      No current facility-administered medications for this visit.    Facility-Administered Medications Ordered in Other Visits  Medication Dose  Route Frequency Provider Last Rate Last Dose  . sodium chloride flush (NS) 0.9 % injection 10 mL  10 mL Intravenous PRN Curt Bears, MD        SURGICAL HISTORY:  Past Surgical History:  Procedure Laterality Date  . BACK SURGERY     for pinched nerve, 2011, here at [facility]  . EYE SURGERY     cataracts removed, ?IOL  . FLEXIBLE BRONCHOSCOPY  11/29/2011  . IR FLUORO GUIDE PORT INSERTION RIGHT  01/30/2017  . IR US GUIDE VASC ACCESS RIGHT  01/30/2017  . removed blood clot     left upper abd quad   . right kidney removed    . right nephrectomy      REVIEW OF SYSTEMS:   Review of Systems  Constitutional: Positive for diminished appetite and weight loss. Negative for chills, fatigue or fever.   HENT:   Negative for mouth sores, nosebleeds, sore throat and trouble swallowing.   Eyes: Negative for eye problems and icterus.  Respiratory: Negative for cough, hemoptysis, shortness of breath and wheezing.   Cardiovascular: Negative for chest pain and leg swelling.  Gastrointestinal: Positive for epigastric pain. Negative for constipation, diarrhea, nausea and vomiting.  Genitourinary: Negative for bladder incontinence, difficulty urinating, dysuria, frequency and hematuria.   Musculoskeletal: Negative for back pain, gait problem, neck pain and neck  stiffness.  Skin: Negative for itching and rash.  Neurological: Negative for dizziness, extremity weakness, gait problem, headaches, light-headedness and seizures.  Hematological: Negative for adenopathy. Does not bruise/bleed easily.  Psychiatric/Behavioral: Negative for confusion, depression and sleep disturbance. The patient is not nervous/anxious.     PHYSICAL EXAMINATION:  Blood pressure 118/73, pulse 86, temperature 98.7 F (37.1 C), temperature source Oral, resp. rate 18, height 5\' 5"  (1.651 m), weight 129 lb 6.4 oz (58.7 kg), SpO2 100 %.  ECOG PERFORMANCE STATUS: 2 - Symptomatic, <50% confined to bed  Physical Exam  Constitutional: Oriented to person, place, and time and frail appearing female, and in no distress.   HENT:  Head: Normocephalic and atraumatic.  Mouth/Throat: Oropharynx is clear and moist. No oropharyngeal exudate.  Eyes: Conjunctivae are normal. Right eye exhibits no discharge. Left eye exhibits no discharge. No scleral icterus.  Neck: Normal range of motion. Neck supple.  Cardiovascular: Normal rate, regular rhythm, normal heart sounds and intact distal pulses.   Pulmonary/Chest: Effort normal and breath sounds normal. No respiratory distress. No wheezes. No rales.  Abdominal: Soft. Bowel sounds are normal. Exhibits no distension and no mass. There is no tenderness.  Musculoskeletal: Normal range of motion. Exhibits no edema.  Lymphadenopathy:    No cervical adenopathy.  Neurological: Alert and oriented to person, place, and time. Exhibits normal muscle tone. Gait normal. Coordination normal.  Skin: Skin is warm and dry. No rash noted. Not diaphoretic. No erythema. No pallor.  Psychiatric: Mood, memory and judgment normal.  Vitals reviewed.  LABORATORY DATA: Lab Results  Component Value Date   WBC 8.5 08/01/2018   HGB 9.5 (L) 08/01/2018   HCT 31.0 (L) 08/01/2018   MCV 120.6 (H) 08/01/2018   PLT 163 08/01/2018      Chemistry      Component Value  Date/Time   NA 143 08/01/2018 1119   NA 141 03/08/2017 1258   K 2.9 (LL) 08/01/2018 1119   K 3.7 03/08/2017 1258   CL 101 08/01/2018 1119   CO2 30 08/01/2018 1119   CO2 23 03/08/2017 1258   BUN 10 08/01/2018 1119   BUN 24.1  03/08/2017 1258   CREATININE 1.04 (H) 08/01/2018 1119   CREATININE 1.4 (H) 03/08/2017 1258      Component Value Date/Time   CALCIUM 8.0 (L) 08/01/2018 1119   CALCIUM 8.8 03/08/2017 1258   ALKPHOS 75 08/01/2018 1119   ALKPHOS 65 03/08/2017 1258   AST 33 08/01/2018 1119   AST 8 03/08/2017 1258   ALT 23 08/01/2018 1119   ALT 20 03/08/2017 1258   BILITOT 0.4 08/01/2018 1119   BILITOT 0.40 03/08/2017 1258       RADIOGRAPHIC STUDIES:  No results found.   ASSESSMENT/PLAN:  This is a very pleasant 73 year old African-American female diagnosed with metastatic renal cell carcinoma that was initially diagnosed 2010.  CT-guided core biopsy of the bilateral pulmonary nodules was consistent with metastatic renal cell carcinoma.  She was treated with stereotactic radiotherapy to the hypermetabolic nodules without tissue diagnosis in the past.  The patient was treated with a combination of ipilimumab and nivolumab for 3 cycles but ipilimumab was discontinued secondary to significant itching that was intolerable for the patient.  She was treated with 2 more cycles of single agent nivolumab which was discontinued secondary to disease progression. She is currently undergoing treatment with oral Cabometyx 40 mg p.o. Daily.  Status post 13 months of treatment.  The patient was seen with Dr. Julien Nordmann today.  The patient had previously been tolerating treatment well without any adverse effects; however, the patient believes that her recent epigastric pain may be associated with her Cabometyx.  Dr. Julien Nordmann recommends that the patient pause treatment for 1 week to see if her symptoms subside. She was instructed to resume treatment in 1 week.   I will arrange for restaging CT scan  to be performed trying to her next visit.  The patient will have a CT of the chest, abdomen, and pelvis without contrast due to her renal insufficiency.   We will see the patient back for follow-up visit in 1 month for evaluation and to review her scan results.  The patient's potassium was found to be 2.9 today.  The patient states that she has stopped taking her potassium supplements prescribed by her primary care provider due to it causing epigastric pain.  The patient had previously tolerated a different formulation of potassium in the past without any adverse effects.  I have sent a refill to the patient's pharmacy of potassium for 20 mg twice daily for 7 days.  The patient was advised that she may crush the tablet in applesauce if needed to improve tolerability. I strongly encouraged the patient to please notify her primary care provider when she experiences adverse effects from her medication as opposed to discontinuing her medication without their knowledge.  I discussed the risks of hypokalemia with the patient including but not limited to arrhythmias and cardiac arrest.  The patient expressed understanding as to the importance of his medication.  I strongly advised the patient to reach out to her primary care provider for long term management of her hypokalemia and to discuss changing her potassium formulation.  The patient was advised to call immediately if she has any concerning symptoms in the interval. The patient voices understanding of current disease status and treatment options and is in agreement with the current care plan. All questions were answered. The patient knows to call the clinic with any problems, questions or concerns. We can certainly see the patient much sooner if necessary  Orders Placed This Encounter  Procedures  . CT Chest Wo Contrast  Standing Status:   Future    Standing Expiration Date:   08/01/2019    Order Specific Question:   ** REASON FOR EXAM (FREE TEXT)     Answer:   Restaging Cancer    Order Specific Question:   Preferred imaging location?    Answer:   [facility]    Order Specific Question:   Radiology Contrast Protocol - do NOT remove file path    Answer:   \\[email or web address].pdf  . CT Abdomen Pelvis Wo Contrast    Standing Status:   Future    Standing Expiration Date:   08/01/2019    Order Specific Question:   ** REASON FOR EXAM (FREE TEXT)    Answer:   Restaging Cancer    Order Specific Question:   Preferred imaging location?    Answer:   [facility]    Order Specific Question:   Is Oral Contrast requested for this exam?    Answer:   Yes, Per Radiology protocol    Order Specific Question:   Radiology Contrast Protocol - do NOT remove file path    Answer:   \\[email or web address]\[email or web address]\CTProtocols.pdf  . CMP ([place] only)    Standing Status:   Future    Standing Expiration Date:   08/01/2019  . CBC with Differential (Cancer Center Only)    Standing Status:   Future    Standing Expiration Date:   08/01/2019     Tobe Sos Cariah Salatino, PA-C 08/01/18  ADDENDUM: Hematology/Oncology Attending: I had a face-to-face encounter with the patient.  I recommended her care plan.  This is a very pleasant 73 years old African-American female with metastatic renal cell carcinoma and currently on treatment with Cabometyx at a reduced dose of 40 mg p.o. daily status post 17 months of treatment.  The patient has been tolerating this treatment well but she is complaining of increasing fatigue and weakness recently.  She is complaining of epigastric pain and the patient has associated her pain with any treatment with Cabometyx and potassium chloride. I recommended for the patient to hold her treatment with Cabometyx for 1 week to see if there is any improvement of her epigastric pain. She will be on potassium supplement for her hypokalemia for the next week. The patient will come back for follow-up visit in  1 months for evaluation after repeating CT scan of the chest, abdomen and pelvis for restaging of her disease. She was advised to call immediately if she has any concerning symptoms in the interval.  Disclaimer: This note was dictated with voice recognition software. Similar sounding words can inadvertently be transcribed and may be missed upon review. Eilleen Kempf, MD 08/02/18

## 2018-08-01 NOTE — Telephone Encounter (Signed)
Oral Oncology Patient Advocate Encounter  TAF approval still has not been extended.  I called the patient to follow up with her to see if she received the letter from [employer]. In that letter is a form she needs to fill out and send back. I also called to offer assistance if she has any trouble with completing the form.  I had to leave a voicemail for the patient.  [place] Patient [place] Phone [phone] Fax [phone] 08/01/2018   12:54 PM

## 2018-08-02 ENCOUNTER — Telehealth: Payer: Self-pay | Admitting: Internal Medicine

## 2018-08-02 NOTE — Telephone Encounter (Signed)
Scheduled appt per 5/14 los - spoke with patient . She is aware of appt date and time

## 2018-08-05 NOTE — Telephone Encounter (Signed)
Oral Oncology Patient Advocate Encounter  TAF expiration still has not been extended.  I called the patient again today and was able to speak to her. She did receive TAF letter and form. She filled it out and mailed it back last week.  I will continue to follow and update.  [place] Patient [place] Phone [phone] Fax [phone] 08/05/2018   2:52 PM

## 2018-08-06 ENCOUNTER — Other Ambulatory Visit: Payer: Self-pay | Admitting: Medical Oncology

## 2018-08-08 NOTE — Telephone Encounter (Signed)
Oral Oncology Patient Advocate Encounter  I spoke with the patient and she has 29 days left of medicine. [employer] is going to mail her another form and when she gets it she will fill it out, sign it and fax it to me. I provided her with my secure fax number. When I receive I will fax it to [employer] to ensure that we get the form to them and the grant reinstated before she will need her next refill.  The patient verbalized understanding and appreciation.  [place] Patient [place] Phone [phone] Fax [phone] 08/08/2018   1:45 PM

## 2018-08-08 NOTE — Telephone Encounter (Signed)
Oral Oncology Patient Advocate Encounter  The application has still not been received by [employer]. They cannot provide an extension but can reinstate the application once the paperwork is received even if there are no fund available. The patients next refill is 5/29 and will not be able to afford the medicine without the grant. [employer] cannot extend the current expiration date of 5/22 until the paperwork is received.   The patient did mail it over a week ago. I feel like the best thing for me to do right now is work on manufacturer assistance so she has a better chance of not going without medicine.  I will call the patient and go over the assistance application process with her. I will be able to submit her part online.  [place] Patient [place] Phone [phone] Fax [phone] 08/08/2018   12:55 PM

## 2018-08-13 ENCOUNTER — Telehealth: Payer: Self-pay | Admitting: *Deleted

## 2018-08-13 NOTE — Telephone Encounter (Signed)
Patient confirmed that Dr. Lysle Rubens is following her Potassium levels and reordering Rx if appropriate.  No need to refill at this time.

## 2018-08-16 MED FILL — CABOMETYX 40 MG TABLET: 40 | 30 days supply | Qty: 30 | Fill #0

## 2018-08-19 NOTE — Telephone Encounter (Signed)
Oral Oncology Patient Advocate Encounter  I spoke to the patient this morning to follow up on the TAF grant. The patient has not received the 2nd letter they mailed to her yet.   Cabometyx was filled at [facility] on 5/29 with a $8.95 copay. The patient will pay this copay and plan to pick up the medicine today.  When she receives the TAF letter she will fill it out and fax it to the fax number listed on the form.   If the patient has any questions or concerns when she receives the  letter she understands that she can call me and I will help her.  I will continue to watch for the grant to be extended and let the pharmacy know when/if that happens.  [place] Patient [place] Phone [phone] Fax [phone] 08/19/2018   9:33 AM

## 2018-08-26 NOTE — Telephone Encounter (Signed)
Oral Oncology Patient Advocate Encounter  I was able to print off an enrollment form from [employer]AF and email it to the patient.  The patient will sign and email back to me. I will fax to [employer] when I receive it.  The patient verbalized understanding and great appreciation.  [place] Patient [place] Phone [phone] Fax [phone] 08/26/2018   1:17 PM

## 2018-08-26 NOTE — Telephone Encounter (Signed)
Oral Oncology Patient Advocate Encounter  I faxed the completed enrollment form today, 08/26/18.  This encounter will be updated until final determination.  [place] Patient [place] Phone [phone] Fax [phone] 08/26/2018   3:28 PM

## 2018-08-27 ENCOUNTER — Other Ambulatory Visit: Payer: Self-pay

## 2018-08-27 ENCOUNTER — Ambulatory Visit
Admission: RE | Admit: 2018-08-27 | Discharge: 2018-08-27 | Disposition: A | Discharge status: Home / Self Care | Payer: Medicare Other | Source: Ambulatory Visit | Attending: Internal Medicine | Admitting: Internal Medicine

## 2018-08-27 DIAGNOSIS — M810 Age-related osteoporosis without current pathological fracture: Secondary | ICD-10-CM

## 2018-08-28 NOTE — Telephone Encounter (Signed)
Oral Oncology Patient Advocate Encounter  TAF grant expiration date has still not been extended.  I spoke with a representative at [employer] and they did receive the enrollment form but the patient would need to call to reinstate the grant.  That phone number is [phone].  I called the patient and spoke with her husband. I gave him this information. He verbalized understanding and will give this information to [place] when she gets home today.  This encounter will be updated until final determination.  [place] Patient [place] Phone [phone] Fax [phone] 08/28/2018   4:00 PM

## 2018-08-29 ENCOUNTER — Ambulatory Visit (HOSPITAL_COMMUNITY)
Admission: RE | Admit: 2018-08-29 | Discharge: 2018-08-29 | Disposition: A | Discharge status: Home / Self Care | Payer: Medicare Other | Source: Ambulatory Visit | Attending: Physician Assistant | Admitting: Physician Assistant

## 2018-08-29 ENCOUNTER — Other Ambulatory Visit: Payer: Self-pay

## 2018-08-29 DIAGNOSIS — C641 Malignant neoplasm of right kidney, except renal pelvis: Secondary | ICD-10-CM | POA: Insufficient documentation

## 2018-09-02 ENCOUNTER — Ambulatory Visit: Payer: Medicare Other | Admitting: Internal Medicine

## 2018-09-02 ENCOUNTER — Telehealth: Payer: Self-pay | Admitting: Medical Oncology

## 2018-09-02 ENCOUNTER — Other Ambulatory Visit: Payer: Medicare Other

## 2018-09-02 ENCOUNTER — Telehealth: Payer: Self-pay | Admitting: Internal Medicine

## 2018-09-02 NOTE — Telephone Encounter (Signed)
Scheduled appt per 6/15 sch message- pt aware of appt date and time  On 6/18

## 2018-09-02 NOTE — Telephone Encounter (Signed)
Pt missed appt today -she thought it was at 1"15 . Schedule message sent.

## 2018-09-03 NOTE — Telephone Encounter (Signed)
Oral Oncology Patient Advocate Encounter:  Called [employer] to follow up on patient's status, and was advised that the patient is inactive. Spoke to rep [place], and was advised that patient's documents were received after the conditional approval date expired- 08/09/18.  I then questioned about the previous rep who stated the patient just needed to call in to request a reinstatement. Was placed on hold, then was advised that the rep placed the reinstatement request for patient. If approved, patient would be covered for the remainder of the calendar year. Was advised to follow up in 24-48 hours for determination.   Phone# [phone]  12:10 PM Beatriz Chancellor, CPhT

## 2018-09-05 ENCOUNTER — Inpatient Hospital Stay: Payer: Medicare Other | Attending: Internal Medicine | Admitting: Internal Medicine

## 2018-09-05 ENCOUNTER — Inpatient Hospital Stay: Payer: Medicare Other

## 2018-09-05 ENCOUNTER — Other Ambulatory Visit: Payer: Self-pay

## 2018-09-05 ENCOUNTER — Encounter: Payer: Self-pay | Admitting: Internal Medicine

## 2018-09-05 VITALS — BP 102/67 | HR 87 | Temp 98.0°F | Resp 18 | Ht 65.0 in | Wt 122.6 lb

## 2018-09-05 DIAGNOSIS — C7801 Secondary malignant neoplasm of right lung: Secondary | ICD-10-CM | POA: Insufficient documentation

## 2018-09-05 DIAGNOSIS — C641 Malignant neoplasm of right kidney, except renal pelvis: Secondary | ICD-10-CM

## 2018-09-05 DIAGNOSIS — R634 Abnormal weight loss: Secondary | ICD-10-CM | POA: Diagnosis not present

## 2018-09-05 DIAGNOSIS — Z95828 Presence of other vascular implants and grafts: Secondary | ICD-10-CM

## 2018-09-05 DIAGNOSIS — Z5111 Encounter for antineoplastic chemotherapy: Secondary | ICD-10-CM

## 2018-09-05 DIAGNOSIS — R630 Anorexia: Secondary | ICD-10-CM | POA: Insufficient documentation

## 2018-09-05 DIAGNOSIS — C3432 Malignant neoplasm of lower lobe, left bronchus or lung: Secondary | ICD-10-CM

## 2018-09-05 DIAGNOSIS — J9611 Chronic respiratory failure with hypoxia: Secondary | ICD-10-CM

## 2018-09-05 DIAGNOSIS — R197 Diarrhea, unspecified: Secondary | ICD-10-CM | POA: Diagnosis not present

## 2018-09-05 DIAGNOSIS — R5383 Other fatigue: Secondary | ICD-10-CM | POA: Insufficient documentation

## 2018-09-05 DIAGNOSIS — I10 Essential (primary) hypertension: Secondary | ICD-10-CM

## 2018-09-05 LAB — CBC WITH DIFFERENTIAL (CANCER CENTER ONLY)
Abs Immature Granulocytes: 0.02 10*3/uL (ref 0.00–0.07)
Basophils Absolute: 0 10*3/uL (ref 0.0–0.1)
Basophils Relative: 0 %
Eosinophils Absolute: 0.1 10*3/uL (ref 0.0–0.5)
Eosinophils Relative: 1 %
HCT: 28.7 % — ABNORMAL LOW (ref 36.0–46.0)
Hemoglobin: 8.5 g/dL — ABNORMAL LOW (ref 12.0–15.0)
Immature Granulocytes: 0 %
Lymphocytes Relative: 15 %
Lymphs Abs: 1.3 10*3/uL (ref 0.7–4.0)
MCH: 37.1 pg — ABNORMAL HIGH (ref 26.0–34.0)
MCHC: 29.6 g/dL — ABNORMAL LOW (ref 30.0–36.0)
MCV: 125.3 fL — ABNORMAL HIGH (ref 80.0–100.0)
Monocytes Absolute: 0.2 10*3/uL (ref 0.1–1.0)
Monocytes Relative: 2 %
Neutro Abs: 6.8 10*3/uL (ref 1.7–7.7)
Neutrophils Relative %: 82 %
Platelet Count: 145 10*3/uL — ABNORMAL LOW (ref 150–400)
RBC: 2.29 MIL/uL — ABNORMAL LOW (ref 3.87–5.11)
RDW: 15.9 % — ABNORMAL HIGH (ref 11.5–15.5)
WBC Count: 8.4 10*3/uL (ref 4.0–10.5)
nRBC: 0 % (ref 0.0–0.2)

## 2018-09-05 LAB — CMP (CANCER CENTER ONLY)
ALT: 21 U/L (ref 0–44)
AST: 22 U/L (ref 15–41)
Albumin: 3.5 g/dL (ref 3.5–5.0)
Alkaline Phosphatase: 64 U/L (ref 38–126)
Anion gap: 10 (ref 5–15)
BUN: 13 mg/dL (ref 8–23)
CO2: 26 mmol/L (ref 22–32)
Calcium: 8.5 mg/dL — ABNORMAL LOW (ref 8.9–10.3)
Chloride: 107 mmol/L (ref 98–111)
Creatinine: 1.08 mg/dL — ABNORMAL HIGH (ref 0.44–1.00)
GFR, Est AFR Am: 60 mL/min — ABNORMAL LOW (ref >60)
GFR, Estimated: 52 mL/min — ABNORMAL LOW (ref >60)
Glucose, Bld: 138 mg/dL — ABNORMAL HIGH (ref 70–99)
Potassium: 4.1 mmol/L (ref 3.5–5.1)
Sodium: 143 mmol/L (ref 135–145)
Total Bilirubin: 0.4 mg/dL (ref 0.3–1.2)
Total Protein: 6.5 g/dL (ref 6.5–8.1)

## 2018-09-05 MED ORDER — SODIUM CHLORIDE 0.9% FLUSH
10.0000 mL | INTRAVENOUS | Status: DC | PRN
  Administered 2018-09-05: 10:00:00 10 mL via INTRAVENOUS
  Filled 2018-09-05: qty 10

## 2018-09-05 MED ORDER — HEPARIN SOD (PORK) LOCK FLUSH 100 UNIT/ML IV SOLN
500.0000 [IU] | Freq: Once | INTRAVENOUS | Status: AC | PRN
  Administered 2018-09-05: 500 [IU] via INTRAVENOUS
  Filled 2018-09-05: qty 5

## 2018-09-05 NOTE — Telephone Encounter (Signed)
Oral Oncology Patient Advocate Encounter:  Called [employer] to follow up on the reinstatement request submitted on 09/03/18. Rep Juliann Pulse, advised that patient's application has been accepted and the information was now being sent to the supervisor to reactivate patient. Was advised that this could take 1-2 business days to complete.  Will need to follow up to confirm reactivation.  Phone# [phone]  9:47 AM Beatriz Chancellor, CPhT

## 2018-09-05 NOTE — Progress Notes (Signed)
[place] Telephone:([phone]) [phone]   Fax:(336) [phone]  OFFICE PROGRESS NOTE  [name], MD [address] [place] [address]  DIAGNOSIS:  1) Metastatic renal cell carcinoma, clear cell type, WHO nuclear grade 3 initially diagnosed in September 2010 and recently presented with bilateral pulmonary nodules. 2) history of renal cell carcinoma status post right nephrectomy in September 2010.  PRIOR THERAPY: 1)Stereotactic radiotherapy to pulmonary nodules based on the metabolic activity with no tissue diagnosis. This was performed under the care of Dr. Pablo Ledger. 2)first line treatment with immunotherapy with Nivolumab 3 MG/KG and Ipilumumab 1 mg/Kg every 3 weeks for 4 cycles followed by maintenance Nivolumab every 3 weeks. First dose 12/27/2016. Status post 3cycles.Stopped due to severe itching. 3) Nivolumab480 mg IV every 4 weeksas a single agentstartingon 05/03/2017.Status post2cycles.  Discontinued secondary to disease progression.  CURRENT THERAPY: Cabometyx 40 mg daily.  First dose started on 07/04/2017.  Status post 14 months of treatment.  INTERVAL HISTORY: Melanie Best 73 y.o. female returns to the clinic today for follow-up visit.  The patient is feeling fine today with no concerning complaints except for mild fatigue.  She denied having any chest pain, shortness of breath, cough or hemoptysis.  She denied having any fever or chills.  She has no nausea or vomiting but has few episodes of diarrhea every few days.  She has no constipation.  She denied having any abdominal pain.  She denied having any headache or visual changes.  She lost few pounds since her last visit.  She had repeat CT scan of the chest, abdomen and pelvis performed recently and she is here for evaluation and discussion of her scan results.  MEDICAL HISTORY: Past Medical History:  Diagnosis Date   Anemia    Anxiety    Arthritis    R leg   Cancer  ([place])    kidney   Chronic fatigue 12/13/2016   CKD (chronic kidney disease), stage II    removed R kidney- 2010- for Cancer, followed by Dr. Diona Fanti   COPD (chronic obstructive pulmonary disease) ([place])    uses O2-2 liters , continuously , followed by Dr. Chase Caller   Cough 08-02-2015   Diabetes mellitus, type 2 ([place])    DM (diabetes mellitus) ([place]) 01/27/2012   DVT (deep venous thrombosis), left    2011- treated /w coumadin    Dyslipidemia    Dysrhythmia    Esophageal reflux    GERD (gastroesophageal reflux disease) 01/27/2012   Glaucoma    Goals of care, counseling/discussion 12/13/2016   Hemoptysis 08-02-15   History of radiation therapy 01/09/12,01/11/12,01/15/12,01/17/12,& 01/19/12   rul lung,50Gy/49fx   HTN (hypertension) 01/27/2012   Hyperlipidemia 01/27/2012   Hypertension    sees Dr. Deforest Hoyles for PCP, denies ever having stress or ECHO, ?when & where she had  last  EKG   Leukocytosis    Lung cancer ([place])    NSCLC   Osteopenia    Renal cell carcinoma of right kidney ([place]) 12/13/2016   Shortness of breath    Syncope    Tremor    Urinary incontinence    Vitamin D deficiency     ALLERGIES:  has No Known Allergies.  MEDICATIONS:  Current Outpatient Medications  Medication Sig Dispense Refill   brinzolamide (AZOPT) 1 % ophthalmic suspension Place 1 drop into both eyes 3 (three) times daily. Reported on 08/12/2015     CABOMETYX 40 MG tablet TAKE 1 TABLET (40MG ) BY MOUTH  ONCE DAILY. TAKE ON AN EMPTY STOMACH 1 HOUR BEFORE OR 2 HOURS AFTER MEALS 30 tablet 1   CALCIUM PO Take by mouth daily.     clonazePAM (KLONOPIN) 1 MG tablet Take 1 mg by mouth 2 (two) times daily as needed. For anxiety     diltiazem (CARDIZEM CD) 180 MG 24 hr capsule Take 180 mg by mouth daily.  0   ergocalciferol (VITAMIN D2) 50000 UNITS capsule Take 50,000 Units by mouth once a week. Take on Monday     furosemide (LASIX) 20 MG tablet Take 20 mg by mouth daily as needed. For  fluid retention     hydrocortisone cream 1 % Apply 1 application topically 2 (two) times daily. 30 g 0   ipratropium-albuterol (DUONEB) 0.5-2.5 (3) MG/3ML SOLN Take 3 mLs by nebulization 4 (four) times daily as needed. 360 mL 4   JANUVIA 50 MG tablet Take 50 mg by mouth daily.      lidocaine-prilocaine (EMLA) cream Apply 1 application topically as needed.     lovastatin (MEVACOR) 20 MG tablet Take 1 tablet by mouth daily.     LUMIGAN 0.01 % SOLN 1 drop daily.     magic mouthwash w/lidocaine SOLN Take 5 mLs by mouth 4 (four) times daily as needed for mouth pain. Swish and spit before meals and at bedtime. 480 mL 0   methocarbamol (ROBAXIN) 750 MG tablet Take 750 mg by mouth 2 (two) times daily as needed.  0   mirtazapine (REMERON) 7.5 MG tablet Take 1 tablet by mouth daily.     mupirocin ointment (BACTROBAN) 2 % Place 1 application into the nose 2 (two) times daily. 30 g 1   Nebulizers (VIOS AEROSOL DELIVERY SYSTEM) MISC USE UTD BY MD WITH NEBULIZER SOLUTION     OXYGEN Inhale 2 L into the lungs continuous.     pantoprazole (PROTONIX) 40 MG tablet Take 1 tablet by mouth daily.     Potassium Chloride ER 20 MEQ TBCR Take 1 tablet by mouth daily.     predniSONE (DELTASONE) 5 MG tablet Take 4 mg by mouth daily with breakfast.      promethazine (PHENERGAN) 25 MG tablet take 1 tablet by mouth every 6 hours if needed for nausea  0   traMADol (ULTRAM) 50 MG tablet Take 1 tablet by mouth 3 (three) times daily as needed.  1   No current facility-administered medications for this visit.    Facility-Administered Medications Ordered in Other Visits  Medication Dose Route Frequency Provider Last Rate Last Dose   sodium chloride flush (NS) 0.9 % injection 10 mL  10 mL Intravenous PRN Curt Bears, MD        SURGICAL HISTORY:  Past Surgical History:  Procedure Laterality Date   BACK SURGERY     for pinched nerve, 2011, here at [facility]     cataracts removed, ?IOL    FLEXIBLE BRONCHOSCOPY  11/29/2011   IR FLUORO GUIDE PORT INSERTION RIGHT  01/30/2017   IR US GUIDE VASC ACCESS RIGHT  01/30/2017   removed blood clot     left upper abd quad    right kidney removed     right nephrectomy      REVIEW OF SYSTEMS:  Constitutional: positive for fatigue Eyes: negative Ears, nose, mouth, throat, and face: negative Respiratory: negative Cardiovascular: negative Gastrointestinal: positive for diarrhea Genitourinary:negative Integument/breast: negative Hematologic/lymphatic: negative Musculoskeletal:negative Neurological: negative Behavioral/Psych: negative Endocrine: negative Allergic/Immunologic: negative   PHYSICAL EXAMINATION: General appearance:  alert, cooperative, fatigued and no distress Head: Normocephalic, without obvious abnormality, atraumatic Neck: no adenopathy, no JVD, supple, symmetrical, trachea midline and thyroid not enlarged, symmetric, no tenderness/mass/nodules Lymph nodes: Cervical, supraclavicular, and axillary nodes normal. Resp: clear to auscultation bilaterally Back: symmetric, no curvature. ROM normal. No CVA tenderness. Cardio: regular rate and rhythm, S1, S2 normal, no murmur, click, rub or gallop GI: soft, non-tender; bowel sounds normal; no masses,  no organomegaly Extremities: extremities normal, atraumatic, no cyanosis or edema Neurologic: Alert and oriented X 3, normal strength and tone. Normal symmetric reflexes. Normal coordination and gait  ECOG PERFORMANCE STATUS: 1 - Symptomatic but completely ambulatory  Blood pressure 102/67, pulse 87, temperature 98 F (36.7 C), temperature source Oral, resp. rate 18, height 5\' 5"  (1.651 m), weight 122 lb 9.6 oz (55.6 kg).  LABORATORY DATA: Lab Results  Component Value Date   WBC 8.4 09/05/2018   HGB 8.5 (L) 09/05/2018   HCT 28.7 (L) 09/05/2018   MCV 125.3 (H) 09/05/2018   PLT 145 (L) 09/05/2018      Chemistry      Component Value Date/Time   NA 143 09/05/2018  1003   NA 141 03/08/2017 1258   K 4.1 09/05/2018 1003   K 3.7 03/08/2017 1258   CL 107 09/05/2018 1003   CO2 26 09/05/2018 1003   CO2 23 03/08/2017 1258   BUN 13 09/05/2018 1003   BUN 24.1 03/08/2017 1258   CREATININE 1.08 (H) 09/05/2018 1003   CREATININE 1.4 (H) 03/08/2017 1258      Component Value Date/Time   CALCIUM 8.5 (L) 09/05/2018 1003   CALCIUM 8.8 03/08/2017 1258   ALKPHOS 64 09/05/2018 1003   ALKPHOS 65 03/08/2017 1258   AST 22 09/05/2018 1003   AST 8 03/08/2017 1258   ALT 21 09/05/2018 1003   ALT 20 03/08/2017 1258   BILITOT 0.4 09/05/2018 1003   BILITOT 0.40 03/08/2017 1258       RADIOGRAPHIC STUDIES: Ct Abdomen Pelvis Wo Contrast  Result Date: 08/30/2018 CLINICAL DATA:  Left renal cancer, diagnosed 2016, IV chemotherapy complete, oral chemotherapy ongoing. Right renal cancer, diagnosed 2010, status post right nephrectomy and XRT. EXAM: CT CHEST, ABDOMEN AND PELVIS WITHOUT CONTRAST TECHNIQUE: Multidetector CT imaging of the chest, abdomen and pelvis was performed following the standard protocol without IV contrast. COMPARISON:  04/09/2018. FINDINGS: CT CHEST FINDINGS Cardiovascular: The heart is normal in size. Trace pericardial fluid. No evidence of thoracic aortic aneurysm. Atherosclerotic calcifications of the aortic arch. Coronary atherosclerosis of the LAD. Right chest port terminates at the cavoatrial junction. Mediastinum/Nodes: No suspicious mediastinal lymphadenopathy. Visualized thyroid is unremarkable. Lungs/Pleura: Stable irregular peribronchovascular opacity along the posterior right upper lobe (series 4/image 55), difficult to discretely measure. Stable irregular peribronchovascular opacity in the central left lower lobe (series 4/image 89), difficult to discretely measure. Associated left lower lobe peribronchovascular nodularity measures up to 8 mm (series 4/image 101), previously 10 mm, possibly mildly improved. Additional scattered 2-3 mm nodules are  unchanged. Moderate centrilobular emphysematous changes, upper lung predominant. No pleural effusion or pneumothorax. Musculoskeletal: No focal osseous lesions. CT ABDOMEN PELVIS FINDINGS Hepatobiliary: Unenhanced liver is unremarkable. Cholelithiasis, without associated inflammatory changes. No intrahepatic or extrahepatic ductal dilatation. Pancreas: Within normal limits. Spleen: Within normal limits. Adrenals/Urinary Tract: Adrenal glands are within normal limits. Status post right nephrectomy. No abnormal soft tissue in the surgical bed. 4.5 x 5.3 cm left lower pole renal mass, previously 4.8 x 5.6 cm, stable versus mildly decreased. Associated thickening of the left  renal vein (series 2/image 65), suggesting renal vein invasion, although poorly evaluated unenhanced CT. Multiple additional left renal lesions, some of which are hyperdense/hemorrhagic, poorly evaluated. No hydronephrosis. Bladder is within normal limits. Stomach/Bowel: Stomach is within normal limits. No evidence of bowel obstruction. Normal appendix (series 2/image 93). Prior cecal wall thickening is less conspicuous on the current study. Left colon is decompressed. Vascular/Lymphatic: No evidence of abdominal aortic aneurysm. Atherosclerotic calcifications of the abdominal aorta and branch vessels. No suspicious abdominopelvic lymphadenopathy. Reproductive: Uterus is within normal limits. Bilateral ovaries are unremarkable. Other: No abdominopelvic ascites. Musculoskeletal: Mild degenerative changes of the lower lumbar spine. IMPRESSION: 4.5 x 5.3 cm left lower pole renal mass, corresponding to the patient's known left renal cell cancer, stable versus mildly decreased. Associated renal vein invasion. Right upper lobe and left lower lobe peribronchovascular opacities are grossly unchanged. Associated left lower lobe peribronchovascular nodularity may be mildly improved. Additional stable ancillary findings as above. Aortic Atherosclerosis  (ICD10-I70.0) and Emphysema (ICD10-J43.9). Electronically Signed   By: Julian Hy M.D.   On: 08/30/2018 08:04   Ct Chest Wo Contrast  Result Date: 08/30/2018 CLINICAL DATA:  Left renal cancer, diagnosed 2016, IV chemotherapy complete, oral chemotherapy ongoing. Right renal cancer, diagnosed 2010, status post right nephrectomy and XRT. EXAM: CT CHEST, ABDOMEN AND PELVIS WITHOUT CONTRAST TECHNIQUE: Multidetector CT imaging of the chest, abdomen and pelvis was performed following the standard protocol without IV contrast. COMPARISON:  04/09/2018. FINDINGS: CT CHEST FINDINGS Cardiovascular: The heart is normal in size. Trace pericardial fluid. No evidence of thoracic aortic aneurysm. Atherosclerotic calcifications of the aortic arch. Coronary atherosclerosis of the LAD. Right chest port terminates at the cavoatrial junction. Mediastinum/Nodes: No suspicious mediastinal lymphadenopathy. Visualized thyroid is unremarkable. Lungs/Pleura: Stable irregular peribronchovascular opacity along the posterior right upper lobe (series 4/image 55), difficult to discretely measure. Stable irregular peribronchovascular opacity in the central left lower lobe (series 4/image 89), difficult to discretely measure. Associated left lower lobe peribronchovascular nodularity measures up to 8 mm (series 4/image 101), previously 10 mm, possibly mildly improved. Additional scattered 2-3 mm nodules are unchanged. Moderate centrilobular emphysematous changes, upper lung predominant. No pleural effusion or pneumothorax. Musculoskeletal: No focal osseous lesions. CT ABDOMEN PELVIS FINDINGS Hepatobiliary: Unenhanced liver is unremarkable. Cholelithiasis, without associated inflammatory changes. No intrahepatic or extrahepatic ductal dilatation. Pancreas: Within normal limits. Spleen: Within normal limits. Adrenals/Urinary Tract: Adrenal glands are within normal limits. Status post right nephrectomy. No abnormal soft tissue in the surgical  bed. 4.5 x 5.3 cm left lower pole renal mass, previously 4.8 x 5.6 cm, stable versus mildly decreased. Associated thickening of the left renal vein (series 2/image 65), suggesting renal vein invasion, although poorly evaluated unenhanced CT. Multiple additional left renal lesions, some of which are hyperdense/hemorrhagic, poorly evaluated. No hydronephrosis. Bladder is within normal limits. Stomach/Bowel: Stomach is within normal limits. No evidence of bowel obstruction. Normal appendix (series 2/image 93). Prior cecal wall thickening is less conspicuous on the current study. Left colon is decompressed. Vascular/Lymphatic: No evidence of abdominal aortic aneurysm. Atherosclerotic calcifications of the abdominal aorta and branch vessels. No suspicious abdominopelvic lymphadenopathy. Reproductive: Uterus is within normal limits. Bilateral ovaries are unremarkable. Other: No abdominopelvic ascites. Musculoskeletal: Mild degenerative changes of the lower lumbar spine. IMPRESSION: 4.5 x 5.3 cm left lower pole renal mass, corresponding to the patient's known left renal cell cancer, stable versus mildly decreased. Associated renal vein invasion. Right upper lobe and left lower lobe peribronchovascular opacities are grossly unchanged. Associated left lower lobe peribronchovascular nodularity may  be mildly improved. Additional stable ancillary findings as above. Aortic Atherosclerosis (ICD10-I70.0) and Emphysema (ICD10-J43.9). Electronically Signed   By: Julian Hy M.D.   On: 08/30/2018 08:04   Dg Bone Density (dxa)  Result Date: 08/27/2018 EXAM: DUAL X-RAY ABSORPTIOMETRY (DXA) FOR BONE MINERAL DENSITY IMPRESSION: Referring Physician:  [name] Your patient completed a BMD test using Lunar IDXA DXA system ( analysis version: 16 ) manufactured by [employer]. Technologist: WLS PATIENT: Name: AILYNE, PAWLEY Patient ID: [id number] Birth Date: [birth year] Height: 65.0 in. Sex: Female Measured: 08/27/2018  Weight: 126.0 lbs. Indications: Advanced Age, COPD, Estrogen Deficient, High risk medication use, History of Fracture (Adult) (V15.51), History of Osteopenia, Kidney Cancer, Low Calcium Intake (269.3), Postmenopausal Fractures: Left wrist Treatments: Vitamin D (E933.5) ASSESSMENT: The BMD measured at Femur Total Right is 0.630 g/cm2 with a T-score of -3.0. This patient is considered OSTEOPOROTIC according to [facility]) criteria. The scan quality is limited by patient body habitus. L-4  was excluded due to degenerative changes. Site Region Measured Date Measured Age YA T-score BMD Significant CHANGE DualFemur Total Right 08/27/2018    71.3         -3.0    0.630 g/cm2 DualFemur Total Mean  08/27/2018    71.3         -3.0    0.635 g/cm2 AP Spine  L1-L3       10/27/2018    73.3         -1.9    0.939 g/cm2 World Health Organization [facility]) criteria for post-menopausal, Caucasian Women: Normal       T-score at or above -1 SD Osteopenia   T-score between -1 and -2.5 SD Osteoporosis T-score at or below -2.5 SD RECOMMENDATION: 1. All patients should optimize calcium and vitamin D intake. 2. Consider FDA approved medical therapies in postmenopausal women and men aged 60 years and older, based on the following: a. A hip or vertebral (clinical or morphometric) fracture b. T- score < or = -2.5 at the femoral neck or spine after appropriate evaluation to exclude secondary causes c. Low bone mass (T-score between -1.0 and -2.5 at the femoral neck or spine) and a 10 year probability of a hip fracture > or = 3% or a 10 year probability of a major osteoporosis-related fracture > or = 20% based on the [place]-adapted WHO algorithm d. Clinician judgment and/or patient preferences may indicate treatment for people with 10-year fracture probabilities above or below these levels FOLLOW-UP: Patients with diagnosis of osteoporosis or at high risk for fracture should have regular bone mineral density tests. For patients  eligible for Medicare, routine testing is allowed once every 2 years. The testing frequency can be increased to one year for patients who have rapidly progressing disease, those who are receiving or discontinuing medical therapy to restore bone mass, or have additional risk factors. I have reviewed this report and agree with the above findings. Mark A. Thornton Papas, M.D. [facility] Radiology Electronically Signed   By: Lavonia Dana M.D.   On: 08/27/2018 16:34    ASSESSMENT AND PLAN: This is a very pleasant 73 years old African-American female with metastatic renal cell carcinoma that was initially diagnosed in 2010 and now presenting with bilateral pulmonary nodules. She was treated with stereotactic radiotherapy to hypermetabolic nodules without tissue diagnosis in the past. Recent imaging studies showed hypermetabolic and enlarging bilateral pulmonary nodules. CT-guided core biopsy of right pulmonary nodule was consistent with metastatic renal cell carcinoma. The patient was treated  with a combination of ipilimumab and nivolumab for 3 cycles but ipilimumab was discontinued secondary to significant itching that was intolerable for the patient.  She was treated with 2 more cycles of single agent nivolumab which was discontinued secondary to disease progression. She is currently undergoing treatment with oral Cabometyx 40 mg p.o. Daily.  Status post 14 months of treatment. She continues to tolerate this treatment well with no concerning adverse effect except for few episodes of diarrhea improved with Imodium. She had repeat CT scan of the chest, abdomen pelvis performed recently.  I personally and independently reviewed the scans and discussed the results with the patient today. Her scan showed no concerning findings for disease progression. I recommended for the patient to continue her current treatment with Cabometyx with the same dose. For the lack of appetite and weight loss, she will continue her current  treatment with Remeron. For the diarrhea she will continue on Imodium on as-needed basis. The patient will come back for follow-up visit in 6 weeks for evaluation with repeat blood work. She was advised to call immediately if she has any concerning symptoms in the interval. The patient voices understanding of current disease status and treatment options and is in agreement with the current care plan. All questions were answered. The patient knows to call the clinic with any problems, questions or concerns. We can certainly see the patient much sooner if necessary.  Disclaimer: This note was dictated with voice recognition software. Similar sounding words can inadvertently be transcribed and may not be corrected upon review.

## 2018-09-05 NOTE — Progress Notes (Signed)
Had trouble getting blood from port during flush. After a couple a flushes and position changes was able to get blood for labs.

## 2018-09-06 NOTE — Telephone Encounter (Signed)
Oral Oncology Patient Advocate Encounter  TAF has extended the patients grant approval to 03/20/19.  I have let the pharmacy know this information.  I called the patient and made her aware of this and she verbalized understanding and great appreciation.  [place] Patient [place] Phone [phone] Fax [phone] 09/06/2018   1:41 PM

## 2018-09-09 ENCOUNTER — Telehealth: Payer: Self-pay | Admitting: Internal Medicine

## 2018-09-09 NOTE — Telephone Encounter (Signed)
Scheduled per los. Called and spoke  With patient. Confirmed date and time

## 2018-09-25 ENCOUNTER — Telehealth: Payer: Self-pay

## 2018-09-25 NOTE — Telephone Encounter (Signed)
Nutrition Assessment   Reason for Assessment:   Patient identified on Malnutrition Screening report for poor appetite and weight loss   ASSESSMENT:   73 year old female with metastatic renal cancer.  Past medical history of CKD stage II, COPD, DM, DVT, dyslipidemia, GERD, HLD, HTN  RD working remotely.  Spoke with patient via phone and introduced self and service at [facility].  Patient reports that her appetite is better since taking medicine to increase her appetite (remeron).  Reports that she normally eats eggs and sausage for breakfast, snacks on cookies and chips and does not eat much lunch.  Supper is meat and 2 vegetables.  Drinks carnation breakfast essentials typically in the morning.    Reports some nausea that is improved with medication.   Nutrition Focused Physical Exam: deferred   Medications: calcium, VIt D, lasix, januvia, protonix, KCL, phenergan   Labs: glucose 138, creatinine 1.08   Anthropometrics:   Height: 65 inches Weight: 122 lb 9.6 oz Noted 129 lb on 5/14, 136 lb on 3/2 BMI: 20  10% weight loss in the last 4 months, significant   Estimated Energy Needs  Kcals: 2080-2233 Protein: 82-96 g Fluid: > 1.6 L   NUTRITION DIAGNOSIS: Inadequate oral intake related to poor appetite and cancer related treatment as evidenced by 10% weight loss in 4 months   INTERVENTION:  Discussed strategies to increase calories and protein. Encouraged drinking carnation instant breakfast BID Discussed some healthier snack options that include protein.  Contact information given   MONITORING, EVALUATION, GOAL: Patient will consume adequate calories and protein to prevent further weight loss   Next Visit: phone f/u August 11  Lannette Avellino B. Zenia Resides, [place], [place] Registered Dietitian [phone] (pager)

## 2018-09-30 MED FILL — CABOMETYX 40 MG TABLET: 40 | 30 days supply | Qty: 30 | Fill #1

## 2018-10-24 ENCOUNTER — Encounter: Payer: Self-pay | Admitting: Internal Medicine

## 2018-10-24 ENCOUNTER — Inpatient Hospital Stay: Payer: Medicare Other | Attending: Internal Medicine | Admitting: Internal Medicine

## 2018-10-24 ENCOUNTER — Other Ambulatory Visit: Payer: Self-pay | Admitting: Physician Assistant

## 2018-10-24 ENCOUNTER — Other Ambulatory Visit: Payer: Self-pay

## 2018-10-24 ENCOUNTER — Inpatient Hospital Stay: Payer: Medicare Other

## 2018-10-24 VITALS — BP 134/70 | HR 100 | Temp 98.5°F | Resp 18 | Ht 65.0 in | Wt 119.4 lb

## 2018-10-24 DIAGNOSIS — C641 Malignant neoplasm of right kidney, except renal pelvis: Secondary | ICD-10-CM | POA: Diagnosis present

## 2018-10-24 DIAGNOSIS — R634 Abnormal weight loss: Secondary | ICD-10-CM | POA: Diagnosis not present

## 2018-10-24 DIAGNOSIS — C7801 Secondary malignant neoplasm of right lung: Secondary | ICD-10-CM | POA: Insufficient documentation

## 2018-10-24 DIAGNOSIS — C3432 Malignant neoplasm of lower lobe, left bronchus or lung: Secondary | ICD-10-CM

## 2018-10-24 DIAGNOSIS — R630 Anorexia: Secondary | ICD-10-CM | POA: Diagnosis not present

## 2018-10-24 DIAGNOSIS — R197 Diarrhea, unspecified: Secondary | ICD-10-CM | POA: Insufficient documentation

## 2018-10-24 DIAGNOSIS — C649 Malignant neoplasm of unspecified kidney, except renal pelvis: Secondary | ICD-10-CM

## 2018-10-24 DIAGNOSIS — I10 Essential (primary) hypertension: Secondary | ICD-10-CM

## 2018-10-24 LAB — CBC WITH DIFFERENTIAL (CANCER CENTER ONLY)
Abs Immature Granulocytes: 0.05 10*3/uL (ref 0.00–0.07)
Basophils Absolute: 0 10*3/uL (ref 0.0–0.1)
Basophils Relative: 0 %
Eosinophils Absolute: 0.1 10*3/uL (ref 0.0–0.5)
Eosinophils Relative: 1 %
HCT: 33.5 % — ABNORMAL LOW (ref 36.0–46.0)
Hemoglobin: 10.3 g/dL — ABNORMAL LOW (ref 12.0–15.0)
Immature Granulocytes: 1 %
Lymphocytes Relative: 27 %
Lymphs Abs: 2.2 10*3/uL (ref 0.7–4.0)
MCH: 38 pg — ABNORMAL HIGH (ref 26.0–34.0)
MCHC: 30.7 g/dL (ref 30.0–36.0)
MCV: 123.6 fL — ABNORMAL HIGH (ref 80.0–100.0)
Monocytes Absolute: 0.3 10*3/uL (ref 0.1–1.0)
Monocytes Relative: 4 %
Neutro Abs: 5.5 10*3/uL (ref 1.7–7.7)
Neutrophils Relative %: 67 %
Platelet Count: 171 10*3/uL (ref 150–400)
RBC: 2.71 MIL/uL — ABNORMAL LOW (ref 3.87–5.11)
RDW: 16.6 % — ABNORMAL HIGH (ref 11.5–15.5)
WBC Count: 8.1 10*3/uL (ref 4.0–10.5)
nRBC: 0.6 % — ABNORMAL HIGH (ref 0.0–0.2)

## 2018-10-24 LAB — CMP (CANCER CENTER ONLY)
ALT: 36 U/L (ref 0–44)
AST: 25 U/L (ref 15–41)
Albumin: 3.4 g/dL — ABNORMAL LOW (ref 3.5–5.0)
Alkaline Phosphatase: 61 U/L (ref 38–126)
Anion gap: 12 (ref 5–15)
BUN: 22 mg/dL (ref 8–23)
CO2: 24 mmol/L (ref 22–32)
Calcium: 8.7 mg/dL — ABNORMAL LOW (ref 8.9–10.3)
Chloride: 106 mmol/L (ref 98–111)
Creatinine: 1.03 mg/dL — ABNORMAL HIGH (ref 0.44–1.00)
GFR, Est AFR Am: >60 mL/min (ref >60)
GFR, Estimated: 55 mL/min — ABNORMAL LOW (ref >60)
Glucose, Bld: 100 mg/dL — ABNORMAL HIGH (ref 70–99)
Potassium: 3.5 mmol/L (ref 3.5–5.1)
Sodium: 142 mmol/L (ref 135–145)
Total Bilirubin: 0.3 mg/dL (ref 0.3–1.2)
Total Protein: 6.1 g/dL — ABNORMAL LOW (ref 6.5–8.1)

## 2018-10-24 NOTE — Progress Notes (Signed)
[facility] [phone]   [phone]  OFFICE PROGRESS NOTE  [name], MD [address] [place] [address]  DIAGNOSIS:  1) Metastatic renal cell carcinoma, clear cell type, WHO nuclear grade 3 initially diagnosed in September 2010 and recently presented with bilateral pulmonary nodules. 2) history of renal cell carcinoma status post right nephrectomy in September 2010.  PRIOR THERAPY: 1)Stereotactic radiotherapy to pulmonary nodules based on the metabolic activity with no tissue diagnosis. This was performed under the care of Dr. Pablo Ledger. 2)first line treatment with immunotherapy with Nivolumab 3 MG/KG and Ipilumumab 1 mg/Kg every 3 weeks for 4 cycles followed by maintenance Nivolumab every 3 weeks. First dose 12/27/2016. Status post 3cycles.Stopped due to severe itching. 3) Nivolumab480 mg IV every 4 weeksas a single agentstartingon 05/03/2017.Status post2cycles.  Discontinued secondary to disease progression.  CURRENT THERAPY: Cabometyx 40 mg daily.  First dose started on 07/04/2017.  Status post 15  months of treatment.  INTERVAL HISTORY: Melanie Best 73 y.o. female returns to the clinic today for follow-up visit.  The patient is feeling fine today with no concerning complaints but she lost 8 more pounds since her last visit.  She denied having any chest pain, shortness of breath, cough or hemoptysis.  She denied having any nausea, vomiting, diarrhea or constipation.  She has no headache or visual changes.  She denied having any fever or chills.  She continues to tolerate her treatment with Cabometyx fairly well.  She is here today for evaluation and repeat blood work.  MEDICAL HISTORY: Past Medical History:  Diagnosis Date  . Anemia   . Anxiety   . Arthritis    R leg  . Cancer ([place])    kidney  . Chronic fatigue 12/13/2016  . CKD (chronic kidney disease), stage II    removed R kidney- 2010- for  Cancer, followed by Dr. Diona Fanti  . COPD (chronic obstructive pulmonary disease) ([facility])    uses O2-2 liters , continuously , followed by Dr. Chase Caller  . Cough 08-02-2015  . Diabetes mellitus, type 2 ([place])   . DM (diabetes mellitus) ([place]) 01/27/2012  . DVT (deep venous thrombosis), left    2011- treated /w coumadin   . Dyslipidemia   . Dysrhythmia   . Esophageal reflux   . GERD (gastroesophageal reflux disease) 01/27/2012  . Glaucoma   . Goals of care, counseling/discussion 12/13/2016  . Hemoptysis 08-02-15  . History of radiation therapy 01/09/12,01/11/12,01/15/12,01/17/12,& 01/19/12   rul lung,50Gy/71fx  . HTN (hypertension) 01/27/2012  . Hyperlipidemia 01/27/2012  . Hypertension    sees Dr. Deforest Hoyles for PCP, denies ever having stress or ECHO, ?when & where she had  last  EKG  . Leukocytosis   . Lung cancer ([place])    NSCLC  . Osteopenia   . Renal cell carcinoma of right kidney ([place]) 12/13/2016  . Shortness of breath   . Syncope   . Tremor   . Urinary incontinence   . Vitamin D deficiency     ALLERGIES:  has No Known Allergies.  MEDICATIONS:  Current Outpatient Medications  Medication Sig Dispense Refill  . brinzolamide (AZOPT) 1 % ophthalmic suspension Place 1 drop into both eyes 3 (three) times daily. Reported on 08/12/2015    . CABOMETYX 40 MG tablet TAKE 1 TABLET (40MG ) BY MOUTH ONCE DAILY. TAKE ON AN EMPTY STOMACH 1 HOUR BEFORE OR 2 HOURS AFTER MEALS 30 tablet 1  . CALCIUM PO Take by mouth  daily.    . clonazePAM (KLONOPIN) 1 MG tablet Take 1 mg by mouth 2 (two) times daily as needed. For anxiety    . diltiazem (CARDIZEM CD) 180 MG 24 hr capsule Take 180 mg by mouth daily.  0  . ergocalciferol (VITAMIN D2) 50000 UNITS capsule Take 50,000 Units by mouth once a week. Take on Monday    . furosemide (LASIX) 20 MG tablet Take 20 mg by mouth daily as needed. For fluid retention    . hydrocortisone cream 1 % Apply 1 application topically 2 (two) times daily. 30 g 0  .  ipratropium-albuterol (DUONEB) 0.5-2.5 (3) MG/3ML SOLN Take 3 mLs by nebulization 4 (four) times daily as needed. 360 mL 4  . JANUVIA 50 MG tablet Take 50 mg by mouth daily.     Marland Kitchen lidocaine-prilocaine (EMLA) cream Apply 1 application topically as needed.    . lovastatin (MEVACOR) 20 MG tablet Take 1 tablet by mouth daily.    Marland Kitchen LUMIGAN 0.01 % SOLN 1 drop daily.    . magic mouthwash w/lidocaine SOLN Take 5 mLs by mouth 4 (four) times daily as needed for mouth pain. Swish and spit before meals and at bedtime. 480 mL 0  . methocarbamol (ROBAXIN) 750 MG tablet Take 750 mg by mouth 2 (two) times daily as needed.  0  . mirtazapine (REMERON) 7.5 MG tablet Take 1 tablet by mouth daily.    . mupirocin ointment (BACTROBAN) 2 % Place 1 application into the nose 2 (two) times daily. 30 g 1  . Nebulizers (VIOS AEROSOL DELIVERY SYSTEM) MISC USE UTD BY MD WITH NEBULIZER SOLUTION    . OXYGEN Inhale 2 L into the lungs continuous.    . pantoprazole (PROTONIX) 40 MG tablet Take 1 tablet by mouth daily.    . Potassium Chloride ER 20 MEQ TBCR Take 1 tablet by mouth daily.    . predniSONE (DELTASONE) 5 MG tablet Take 4 mg by mouth daily with breakfast.     . promethazine (PHENERGAN) 25 MG tablet take 1 tablet by mouth every 6 hours if needed for nausea  0  . traMADol (ULTRAM) 50 MG tablet Take 1 tablet by mouth 3 (three) times daily as needed.  1   No current facility-administered medications for this visit.    Facility-Administered Medications Ordered in Other Visits  Medication Dose Route Frequency Provider Last Rate Last Dose  . sodium chloride flush (NS) 0.9 % injection 10 mL  10 mL Intravenous PRN Curt Bears, MD        SURGICAL HISTORY:  Past Surgical History:  Procedure Laterality Date  . BACK SURGERY     for pinched nerve, 2011, [facility]  . EYE SURGERY     cataracts removed, ?IOL  . FLEXIBLE BRONCHOSCOPY  11/29/2011  . IR FLUORO GUIDE PORT INSERTION RIGHT  01/30/2017  . IR US GUIDE VASC  ACCESS RIGHT  01/30/2017  . removed blood clot     left upper abd quad   . right kidney removed    . right nephrectomy      REVIEW OF SYSTEMS:  A comprehensive review of systems was negative except for: Constitutional: positive for fatigue and weight loss   PHYSICAL EXAMINATION: General appearance: alert, cooperative, fatigued and no distress Head: Normocephalic, without obvious abnormality, atraumatic Neck: no adenopathy, no JVD, supple, symmetrical, trachea midline and thyroid not enlarged, symmetric, no tenderness/mass/nodules Lymph nodes: Cervical, supraclavicular, and axillary nodes normal. Resp: clear to auscultation bilaterally Back: symmetric, no  curvature. ROM normal. No CVA tenderness. Cardio: regular rate and rhythm, S1, S2 normal, no murmur, click, rub or gallop GI: soft, non-tender; bowel sounds normal; no masses,  no organomegaly Extremities: extremities normal, atraumatic, no cyanosis or edema  ECOG PERFORMANCE STATUS: 1 - Symptomatic but completely ambulatory  Blood pressure 134/70, pulse 100, temperature 98.5 F (36.9 C), temperature source Oral, resp. rate 18, height 5\' 5"  (1.651 m), weight 119 lb 6.4 oz (54.2 kg), SpO2 100 %.  LABORATORY DATA: Lab Results  Component Value Date   WBC 8.1 10/24/2018   HGB 10.3 (L) 10/24/2018   HCT 33.5 (L) 10/24/2018   MCV 123.6 (H) 10/24/2018   PLT 171 10/24/2018      Chemistry      Component Value Date/Time   NA 143 09/05/2018 1003   NA 141 03/08/2017 1258   K 4.1 09/05/2018 1003   K 3.7 03/08/2017 1258   CL 107 09/05/2018 1003   CO2 26 09/05/2018 1003   CO2 23 03/08/2017 1258   BUN 13 09/05/2018 1003   BUN 24.1 03/08/2017 1258   CREATININE 1.08 (H) 09/05/2018 1003   CREATININE 1.4 (H) 03/08/2017 1258      Component Value Date/Time   CALCIUM 8.5 (L) 09/05/2018 1003   CALCIUM 8.8 03/08/2017 1258   ALKPHOS 64 09/05/2018 1003   ALKPHOS 65 03/08/2017 1258   AST 22 09/05/2018 1003   AST 8 03/08/2017 1258   ALT 21  09/05/2018 1003   ALT 20 03/08/2017 1258   BILITOT 0.4 09/05/2018 1003   BILITOT 0.40 03/08/2017 1258       RADIOGRAPHIC STUDIES: No results found.  ASSESSMENT AND PLAN: This is a very pleasant 74 years old African-American female with metastatic renal cell carcinoma that was initially diagnosed in 2010 and now presenting with bilateral pulmonary nodules. She was treated with stereotactic radiotherapy to hypermetabolic nodules without tissue diagnosis in the past. Recent imaging studies showed hypermetabolic and enlarging bilateral pulmonary nodules. CT-guided core biopsy of right pulmonary nodule was consistent with metastatic renal cell carcinoma. The patient was treated with a combination of ipilimumab and nivolumab for 3 cycles but ipilimumab was discontinued secondary to significant itching that was intolerable for the patient.  She was treated with 2 more cycles of single agent nivolumab which was discontinued secondary to disease progression. She is currently undergoing treatment with oral Cabometyx 40 mg p.o. Daily.  Status post 15 months of treatment. She continues to tolerate the treatment well with no concerning adverse effects. I recommended for her to continue her current treatment with Cabometyx with the same dose. For the lack of appetite and weight loss, she will continue her current treatment with Remeron. For the diarrhea she will continue on Imodium on as-needed basis. I will see her back for follow-up visit in 1 months for evaluation with repeat CT scan of the chest, abdomen and pelvis for restaging of her disease. She was advised to call immediately if she has any concerning symptoms in the interval. The patient voices understanding of current disease status and treatment options and is in agreement with the current care plan. All questions were answered. The patient knows to call the clinic with any problems, questions or concerns. We can certainly see the patient much  sooner if necessary.  Disclaimer: This note was dictated with voice recognition software. Similar sounding words can inadvertently be transcribed and may not be corrected upon review.

## 2018-10-28 ENCOUNTER — Telehealth: Payer: Self-pay | Admitting: Internal Medicine

## 2018-10-28 NOTE — Telephone Encounter (Signed)
Scheduled appt per 8/6 los - pt aware of apt date and times

## 2018-10-29 ENCOUNTER — Inpatient Hospital Stay: Payer: Medicare Other

## 2018-10-29 NOTE — Progress Notes (Signed)
Nutrition Follow-up:  Patient with metastatic renal cancer.  Patient taking carbometyx.    Spoke with patient via phone for nutrition follow-up.  Patient reports that her appetite is maybe a little bit better.  Reports that she is not sure why she lost weight.  Patient continues to eat 2 meals per day (breakfast and supper). Snacks on corn chips, fruit cups, peanut butter crackers during the day.  Drinking 2 carnation breakfast essentials per day.  Reports that she is taking remeron.  Reports has diarrhea but only 1 time per week.  Reports that she sometimes has multiple stools on that 1 day then it goes away.  Reports that eating fast food makes it worse.      Medications: remeron  Labs: creatinine 1.03, glucose 100  Anthropometrics:   Weight 119 lb on 8/6 decreased from 122 lb 9.6 oz   NUTRITION DIAGNOSIS: Inadequate oral intake continues   INTERVENTION:  Reviewed ways to increase calories and protein.  Discussed additional snacks that she can eat during mid day. Encouraged higher calorie shakes.  Patient will try boost plus again.    MONITORING, EVALUATION, GOAL:  Patient will consume adequate calories and protein to prevent further weight loss   NEXT VISIT: phone f/u Sept 15  Margerite Impastato B. Zenia Resides, [place], [place] Registered Dietitian [phone] (pager)

## 2018-10-30 MED FILL — CABOMETYX 40 MG TABLET: 40 | 30 days supply | Qty: 30 | Fill #0

## 2018-11-04 ENCOUNTER — Telehealth: Payer: Self-pay | Admitting: *Deleted

## 2018-11-04 NOTE — Telephone Encounter (Signed)
REceived call from patient.  She states she has no appetite and cannot sleep. These symptoms have been going on for quite awhile after starting Cabometyx.  She is losing weight. She is taking Remeron for appetite but she states it is not helping.  She thinks she may need to stop her oral chemo.  Please advise

## 2018-11-04 NOTE — Telephone Encounter (Signed)
Hold cabometyx for one week. She can call back before she starts to tell us how does she feel.

## 2018-11-05 NOTE — Telephone Encounter (Signed)
Pt notified and voices understanding. 

## 2018-11-21 ENCOUNTER — Other Ambulatory Visit: Payer: Self-pay | Admitting: Medical Oncology

## 2018-11-21 DIAGNOSIS — C649 Malignant neoplasm of unspecified kidney, except renal pelvis: Secondary | ICD-10-CM

## 2018-11-22 ENCOUNTER — Encounter (HOSPITAL_COMMUNITY): Payer: Self-pay

## 2018-11-22 ENCOUNTER — Ambulatory Visit (HOSPITAL_COMMUNITY)
Admission: RE | Admit: 2018-11-22 | Discharge: 2018-11-22 | Disposition: A | Discharge status: Home / Self Care | Payer: Medicare Other | Source: Ambulatory Visit | Attending: Internal Medicine | Admitting: Internal Medicine

## 2018-11-22 ENCOUNTER — Inpatient Hospital Stay: Payer: Medicare Other | Attending: Internal Medicine

## 2018-11-22 ENCOUNTER — Other Ambulatory Visit: Payer: Self-pay

## 2018-11-22 DIAGNOSIS — C7801 Secondary malignant neoplasm of right lung: Secondary | ICD-10-CM | POA: Insufficient documentation

## 2018-11-22 DIAGNOSIS — R630 Anorexia: Secondary | ICD-10-CM | POA: Diagnosis not present

## 2018-11-22 DIAGNOSIS — C641 Malignant neoplasm of right kidney, except renal pelvis: Secondary | ICD-10-CM | POA: Insufficient documentation

## 2018-11-22 DIAGNOSIS — G4700 Insomnia, unspecified: Secondary | ICD-10-CM | POA: Insufficient documentation

## 2018-11-22 DIAGNOSIS — C649 Malignant neoplasm of unspecified kidney, except renal pelvis: Secondary | ICD-10-CM | POA: Insufficient documentation

## 2018-11-22 LAB — CBC WITH DIFFERENTIAL (CANCER CENTER ONLY)
Abs Immature Granulocytes: 0.12 10*3/uL — ABNORMAL HIGH (ref 0.00–0.07)
Basophils Absolute: 0 10*3/uL (ref 0.0–0.1)
Basophils Relative: 0 %
Eosinophils Absolute: 0 10*3/uL (ref 0.0–0.5)
Eosinophils Relative: 0 %
HCT: 33 % — ABNORMAL LOW (ref 36.0–46.0)
Hemoglobin: 10.1 g/dL — ABNORMAL LOW (ref 12.0–15.0)
Immature Granulocytes: 1 %
Lymphocytes Relative: 15 %
Lymphs Abs: 1.2 10*3/uL (ref 0.7–4.0)
MCH: 38 pg — ABNORMAL HIGH (ref 26.0–34.0)
MCHC: 30.6 g/dL (ref 30.0–36.0)
MCV: 124.1 fL — ABNORMAL HIGH (ref 80.0–100.0)
Monocytes Absolute: 0.3 10*3/uL (ref 0.1–1.0)
Monocytes Relative: 4 %
Neutro Abs: 6.6 10*3/uL (ref 1.7–7.7)
Neutrophils Relative %: 80 %
Platelet Count: 200 10*3/uL (ref 150–400)
RBC: 2.66 MIL/uL — ABNORMAL LOW (ref 3.87–5.11)
RDW: 16.6 % — ABNORMAL HIGH (ref 11.5–15.5)
WBC Count: 8.3 10*3/uL (ref 4.0–10.5)
nRBC: 1.8 % — ABNORMAL HIGH (ref 0.0–0.2)

## 2018-11-22 LAB — CMP (CANCER CENTER ONLY)
ALT: 44 U/L (ref 0–44)
AST: 30 U/L (ref 15–41)
Albumin: 3.8 g/dL (ref 3.5–5.0)
Alkaline Phosphatase: 62 U/L (ref 38–126)
Anion gap: 13 (ref 5–15)
BUN: 36 mg/dL — ABNORMAL HIGH (ref 8–23)
CO2: 25 mmol/L (ref 22–32)
Calcium: 8.8 mg/dL — ABNORMAL LOW (ref 8.9–10.3)
Chloride: 105 mmol/L (ref 98–111)
Creatinine: 1.14 mg/dL — ABNORMAL HIGH (ref 0.44–1.00)
GFR, Est AFR Am: 56 mL/min — ABNORMAL LOW (ref >60)
GFR, Estimated: 48 mL/min — ABNORMAL LOW (ref >60)
Glucose, Bld: 129 mg/dL — ABNORMAL HIGH (ref 70–99)
Potassium: 4.1 mmol/L (ref 3.5–5.1)
Sodium: 143 mmol/L (ref 135–145)
Total Bilirubin: 0.2 mg/dL — ABNORMAL LOW (ref 0.3–1.2)
Total Protein: 6.6 g/dL (ref 6.5–8.1)

## 2018-11-22 LAB — TSH: TSH: 5.207 u[IU]/mL — ABNORMAL HIGH (ref 0.308–3.960)

## 2018-11-22 MED ORDER — SODIUM CHLORIDE (PF) 0.9 % IJ SOLN
INTRAMUSCULAR | Status: AC
  Filled 2018-11-22: qty 50

## 2018-11-22 MED ORDER — IOHEXOL 300 MG/ML  SOLN
100.0000 mL | Freq: Once | INTRAMUSCULAR | Status: AC | PRN
  Administered 2018-11-22: 14:00:00 100 mL via INTRAVENOUS

## 2018-11-26 ENCOUNTER — Other Ambulatory Visit: Payer: Self-pay

## 2018-11-26 ENCOUNTER — Encounter: Payer: Self-pay | Admitting: Physician Assistant

## 2018-11-26 ENCOUNTER — Inpatient Hospital Stay (HOSPITAL_BASED_OUTPATIENT_CLINIC_OR_DEPARTMENT_OTHER): Payer: Medicare Other | Admitting: Physician Assistant

## 2018-11-26 VITALS — BP 126/71 | HR 103 | Temp 98.0°F | Resp 18 | Wt 120.0 lb

## 2018-11-26 DIAGNOSIS — G4700 Insomnia, unspecified: Secondary | ICD-10-CM | POA: Diagnosis not present

## 2018-11-26 DIAGNOSIS — C641 Malignant neoplasm of right kidney, except renal pelvis: Secondary | ICD-10-CM | POA: Diagnosis not present

## 2018-11-26 DIAGNOSIS — C649 Malignant neoplasm of unspecified kidney, except renal pelvis: Secondary | ICD-10-CM | POA: Diagnosis not present

## 2018-11-26 DIAGNOSIS — R630 Anorexia: Secondary | ICD-10-CM | POA: Insufficient documentation

## 2018-11-26 MED ORDER — MIRTAZAPINE 15 MG PO TABS: 15.0000 mg | ORAL_TABLET | Freq: Every day | ORAL | 1 refills | Status: DC

## 2018-11-26 NOTE — Progress Notes (Signed)
[facility] OFFICE PROGRESS NOTE  [name], MD [place] [place] [address]  DIAGNOSIS:  1) Metastatic renal cell carcinoma, clear cell type, WHO nuclear grade 3 initially diagnosed in September 2010 and recently presented with bilateral pulmonary nodules. 2) history of renal cell carcinoma status post right nephrectomy in September 2010.  PRIOR THERAPY:  1)Stereotactic radiotherapy to pulmonary nodules based on the metabolic activity with no tissue diagnosis. This was performed under the care of Dr. Pablo Ledger. 2)first line treatment with immunotherapy with Nivolumab 3 MG/KG and Ipilumumab 1 mg/Kg every 3 weeks for 4 cycles followed by maintenance Nivolumab every 3 weeks. First dose 12/27/2016. Status post 3cycles.Stopped due to severe itching. 3)Nivolumab480 mg IV every 4 weeksas a single agentstartingon 05/03/2017.Status post2cycles.Discontinued secondary to disease progression.  CURRENT THERAPY: Cabometyx 40 mg daily.First dose started on 07/04/2017. Status post 71month of treatment.  INTERVAL HISTORY: Melanie DEGRAFFENREID89y.o. female returns to the clinic for a follow-up visit.  The patient is feeling fair today without any concerning complaints except for diminished appetite, insomnia, and occassional nausea.  The patient attributes the symptoms to her Cabometyx.  The patient held treatment for an additional week and states that her symptoms had subsided during that time. She states that she felt "a whole lot better" from taking a break from treatment. She stopped taking carbometyx from 8/21-9/1.  She recently started this cycle of cabometyx on 11/19/2018. She states that the side effects of insomnia and decreased appetite "have not hit her yet" since starting up again on treatment.  She  is currently taking 7.5 mg of Remeron p.o. daily.  She has also met with our nutritional specialist.  She has not lost any weight since her last  visit.  As the patient does not have any other concerning complaints today.  She denies any recent fever, chills, or night sweats.  She denies any recent nausea.  He denies any vomiting, diarrhea, or constipation.  She denies any pain. She denies any chest pain, shortness of breath, cough, or hemoptysis.  She denies any headache or visual changes.  She denies any back pain.  She denies any hematuria or changes in her urinary patterns.  She recently had a restaging CT scan performed.  She is here today for evaluation and to review her scan results.  MEDICAL HISTORY: Past Medical History:  Diagnosis Date  . Anemia   . Anxiety   . Arthritis    R leg  . Cancer ([facility]    kidney  . Chronic fatigue 12/13/2016  . CKD (chronic kidney disease), stage II    removed R kidney- 2010- for Cancer, followed by Dr. DDiona Fanti . COPD (chronic obstructive pulmonary disease) (HCC)    uses O2-2 liters , continuously , followed by Dr. RChase Caller . Cough 08-02-2015  . Diabetes mellitus, type 2 ([facility]   . DM (diabetes mellitus) ([facility] 01/27/2012  . DVT (deep venous thrombosis), left    2011- treated /w coumadin   . Dyslipidemia   . Dysrhythmia   . Esophageal reflux   . GERD (gastroesophageal reflux disease) 01/27/2012  . Glaucoma   . Goals of care, counseling/discussion 12/13/2016  . Hemoptysis 08-02-15  . History of radiation therapy 01/09/12,01/11/12,01/15/12,01/17/12,& 01/19/12   rul lung,50Gy/592f . HTN (hypertension) 01/27/2012  . Hyperlipidemia 01/27/2012  . Hypertension    sees Dr. HuDeforest Hoylesor PCP, denies ever having stress or ECHO, ?when & where she had  last  EKG  .  Leukocytosis   . Lung cancer ([place])    NSCLC  . Osteopenia   . Renal cell carcinoma of right kidney ([place]) 12/13/2016  . Shortness of breath   . Syncope   . Tremor   . Urinary incontinence   . Vitamin D deficiency     ALLERGIES:  has No Known Allergies.  MEDICATIONS:  Current Outpatient Medications  Medication Sig Dispense Refill  .  brinzolamide (AZOPT) 1 % ophthalmic suspension Place 1 drop into both eyes 3 (three) times daily. Reported on 08/12/2015    . CABOMETYX 40 MG tablet TAKE 1 TABLET (40MG) BY MOUTH ONCE DAILY. TAKE ON AN EMPTY STOMACH 1 HOUR BEFORE OR 2 HOURS AFTER MEALS 30 tablet 1  . CALCIUM PO Take by mouth daily.    . clonazePAM (KLONOPIN) 1 MG tablet Take 1 mg by mouth 2 (two) times daily as needed. For anxiety    . diltiazem (CARDIZEM CD) 180 MG 24 hr capsule Take 180 mg by mouth daily.  0  . ergocalciferol (VITAMIN D2) 50000 UNITS capsule Take 50,000 Units by mouth once a week. Take on Monday    . furosemide (LASIX) 20 MG tablet Take 20 mg by mouth daily as needed. For fluid retention    . hydrocortisone cream 1 % Apply 1 application topically 2 (two) times daily. 30 g 0  . ipratropium-albuterol (DUONEB) 0.5-2.5 (3) MG/3ML SOLN Take 3 mLs by nebulization 4 (four) times daily as needed. 360 mL 4  . JANUVIA 50 MG tablet Take 50 mg by mouth daily.     Marland Kitchen lidocaine-prilocaine (EMLA) cream Apply 1 application topically as needed.    . lovastatin (MEVACOR) 20 MG tablet Take 1 tablet by mouth daily.    Marland Kitchen LUMIGAN 0.01 % SOLN 1 drop daily.    . magic mouthwash w/lidocaine SOLN Take 5 mLs by mouth 4 (four) times daily as needed for mouth pain. Swish and spit before meals and at bedtime. 480 mL 0  . methocarbamol (ROBAXIN) 750 MG tablet Take 750 mg by mouth 2 (two) times daily as needed.  0  . mirtazapine (REMERON) 15 MG tablet Take 1 tablet (15 mg total) by mouth daily. 30 tablet 1  . mupirocin ointment (BACTROBAN) 2 % Place 1 application into the nose 2 (two) times daily. 30 g 1  . Nebulizers (VIOS AEROSOL DELIVERY SYSTEM) MISC USE UTD BY MD WITH NEBULIZER SOLUTION    . OXYGEN Inhale 2 L into the lungs continuous.    . pantoprazole (PROTONIX) 40 MG tablet Take 1 tablet by mouth daily.    . Potassium Chloride ER 20 MEQ TBCR Take 1 tablet by mouth daily.    . predniSONE (DELTASONE) 5 MG tablet Take 4 mg by mouth daily  with breakfast.     . promethazine (PHENERGAN) 25 MG tablet take 1 tablet by mouth every 6 hours if needed for nausea  0   No current facility-administered medications for this visit.    Facility-Administered Medications Ordered in Other Visits  Medication Dose Route Frequency Provider Last Rate Last Dose  . sodium chloride flush (NS) 0.9 % injection 10 mL  10 mL Intravenous PRN Curt Bears, MD        SURGICAL HISTORY:  Past Surgical History:  Procedure Laterality Date  . BACK SURGERY     for pinched nerve, 2011, here at [facility]  . EYE SURGERY     cataracts removed, ?IOL  . FLEXIBLE BRONCHOSCOPY  11/29/2011  . IR FLUORO GUIDE PORT  INSERTION RIGHT  01/30/2017  . IR US GUIDE VASC ACCESS RIGHT  01/30/2017  . removed blood clot     left upper abd quad   . right kidney removed    . right nephrectomy      REVIEW OF SYSTEMS:   Review of Systems  Constitutional: Positive for decreased appetite. Negative for chills, fatigue, fever and unexpected weight change.  HENT: Negative for mouth sores, nosebleeds, sore throat and trouble swallowing.   Eyes: Negative for eye problems and icterus.  Respiratory: Negative for cough, hemoptysis, shortness of breath and wheezing.   Cardiovascular: Negative for chest pain and leg swelling.  Gastrointestinal: Positive for occasional nausea. Negative for abdominal pain, constipation, diarrhea, nausea and vomiting.  Genitourinary: Negative for bladder incontinence, difficulty urinating, dysuria, frequency and hematuria.   Musculoskeletal: Negative for back pain, gait problem, neck pain and neck stiffness.  Skin: Negative for itching and rash.  Neurological: Negative for dizziness, extremity weakness, gait problem, headaches, light-headedness and seizures.  Hematological: Negative for adenopathy. Does not bruise/bleed easily.  Psychiatric/Behavioral: Positive for insomnia. Negative for confusion or depression. The patient is not nervous/anxious.      PHYSICAL EXAMINATION:  Blood pressure 126/71, pulse (!) 103, temperature 98 F (36.7 C), temperature source Temporal, resp. rate 18, weight 120 lb (54.4 kg), SpO2 98 %.  ECOG PERFORMANCE STATUS: 1 - Symptomatic but completely ambulatory  Physical Exam  Constitutional: Oriented to person, place, and time and well-developed, well-nourished, and in no distress. The patient was examined in the wheelchair.  HENT:  Head: Normocephalic and atraumatic.  Mouth/Throat: Oropharynx is clear and moist. No oropharyngeal exudate.  Eyes: Conjunctivae are normal. Right eye exhibits no discharge. Left eye exhibits no discharge. No scleral icterus.  Neck: Normal range of motion. Neck supple.  Cardiovascular: Normal rate, regular rhythm, normal heart sounds and intact distal pulses.   Pulmonary/Chest: Effort normal and breath sounds normal. No respiratory distress. No wheezes. No rales.  Abdominal: Soft. Bowel sounds are normal. Exhibits no distension and no mass. There is no tenderness.  Musculoskeletal: Normal range of motion. Exhibits no edema.  Lymphadenopathy:    No cervical adenopathy.  Neurological: Essential tremor/head titubation noted on exam. Alert and oriented to person, place, and time. Exhibits normal muscle tone. Gait normal. Coordination normal.  Skin: Skin is warm and dry. No rash noted. Not diaphoretic. No erythema. No pallor.  Psychiatric: Mood, memory and judgment normal.  Vitals reviewed.  LABORATORY DATA: Lab Results  Component Value Date   WBC 8.3 11/22/2018   HGB 10.1 (L) 11/22/2018   HCT 33.0 (L) 11/22/2018   MCV 124.1 (H) 11/22/2018   PLT 200 11/22/2018      Chemistry      Component Value Date/Time   NA 143 11/22/2018 1323   NA 141 03/08/2017 1258   K 4.1 11/22/2018 1323   K 3.7 03/08/2017 1258   CL 105 11/22/2018 1323   CO2 25 11/22/2018 1323   CO2 23 03/08/2017 1258   BUN 36 (H) 11/22/2018 1323   BUN 24.1 03/08/2017 1258   CREATININE 1.14 (H) 11/22/2018  1323   CREATININE 1.4 (H) 03/08/2017 1258      Component Value Date/Time   CALCIUM 8.8 (L) 11/22/2018 1323   CALCIUM 8.8 03/08/2017 1258   ALKPHOS 62 11/22/2018 1323   ALKPHOS 65 03/08/2017 1258   AST 30 11/22/2018 1323   AST 8 03/08/2017 1258   ALT 44 11/22/2018 1323   ALT 20 03/08/2017 1258   BILITOT 0.2 (  L) 11/22/2018 1323   BILITOT 0.40 03/08/2017 1258       RADIOGRAPHIC STUDIES:  Ct Abdomen Pelvis W Wo Contrast  Result Date: 11/22/2018 CLINICAL DATA:  Metastatic renal cell carcinoma. Status post right nephrectomy. EXAM: CT CHEST, ABDOMEN, AND PELVIS WITH CONTRAST TECHNIQUE: Multidetector CT imaging of the chest, abdomen and pelvis was performed following the standard protocol during bolus administration of intravenous contrast. CONTRAST:  169m OMNIPAQUE IOHEXOL 300 MG/ML  SOLN COMPARISON:  08/29/2018 FINDINGS: CT CHEST FINDINGS Cardiovascular: Heart size within normal limits. Tiny pericardial effusion is similar to prior. Coronary artery calcification is evident. Atherosclerotic calcification is noted in the wall of the thoracic aorta. Right Port-A-Cath tip is positioned in the distal SVC near the RA junction. Mediastinum/Nodes: No mediastinal lymphadenopathy. There is no hilar lymphadenopathy. The esophagus has normal imaging features. There is no axillary lymphadenopathy. Lungs/Pleura: Centrilobular emphsyema noted. Bandlike architectural distortion and scarring in the suprahilar right upper lobe is new in the interval. Irregular right upper lobe lesion seen posteriorly on the prior study is no longer evident and has apparently become incorporated into this process. Tiny right middle lobe nodule on 98/10 is similar to prior. 2 mm posterior right lower lobe nodule on 139/10 is new in the interval. 1.5 cm central left lower lobe pulmonary nodule (103/10) is similar to prior study. However, there is now an area of irregular consolidative opacity just cranial to this nodule with retraction  of the major fissure and associated bronchiectasis, presumably related to radiation therapy. No evidence of pleural effusion. Musculoskeletal: Bones are diffusely demineralized. No worrisome lytic or sclerotic osseous abnormality. CT ABDOMEN PELVIS FINDINGS Hepatobiliary: 1.9 cm heterogeneously enhancing lesion is identified in the medial segment left liver (99/6). Multiple prior abdominal CTs dating back to 10/01/2017 have been performed without intravenous contrast material, but there was hyperenhancement in this region on the 10/01/2017 exam. Gallbladder is distended with intraluminal stones. Gallbladder regularity is similar to prior. No intrahepatic or extrahepatic biliary dilation. Pancreas: No focal mass lesion. No dilatation of the main duct. No intraparenchymal cyst. No peripancreatic edema. Spleen: No splenomegaly. No focal mass lesion. Adrenals/Urinary Tract: No adrenal nodule or mass. Right kidney surgically absent. Heterogeneously enhancing mass in the interpolar region and lower pole the right kidney measures at least 5.8 x 6.2 x 4.7 cm. This mass extends into the enlarged left renal vein where there is enhancing intraluminal soft tissue (coronal image 47 of series 7 and coronal image 43 of series 12). Tumor and bland thrombus within the left renal vein extends centrally to the midline (image 108/series 6). Left ureter is nondilated. The urinary bladder appears normal for the degree of distention. Stomach/Bowel: Stomach is unremarkable. No gastric wall thickening. No evidence of outlet obstruction. Duodenum is normally positioned as is the ligament of Treitz. No small bowel wall thickening. No small bowel dilatation. The terminal ileum is normal. The appendix is not visualized, but there is no edema or inflammation in the region of the cecum. No gross colonic mass. No colonic wall thickening. Vascular/Lymphatic: There is abdominal aortic atherosclerosis without aneurysm. There is no gastrohepatic or  hepatoduodenal ligament lymphadenopathy. No intraperitoneal or retroperitoneal lymphadenopathy. No pelvic sidewall lymphadenopathy. Reproductive: The uterus is unremarkable.  There is no adnexal mass. Other: No intraperitoneal free fluid. Musculoskeletal: No worrisome lytic or sclerotic osseous abnormality. IMPRESSION: 1. Left renal mass again identified with evidence of left renal vein invasion. No substantial interval change although the lesion measures minimally larger today. 2. Right upper lobe pulmonary nodule  is been incorporated into radiation scarring. The left lower lobe nodule is similar with adjacent scarring from prior radiation. 3. 1.9 cm heterogeneously enhancing region is identified in the medial segment left liver, slightly more conspicuous than on the most recent contrast infused CT of 10/01/2017. Continued close attention on follow-up recommended. 4. New tiny posterior right lower lobe pulmonary nodule. Attention on follow-up recommended. 5.  Aortic Atherosclerois (ICD10-170.0) 6.  Emphysema. ([id number]) Electronically Signed   By: Misty Stanley M.D.   On: 11/22/2018 15:52   Ct Chest W Contrast  Result Date: 11/22/2018 CLINICAL DATA:  Metastatic renal cell carcinoma. Status post right nephrectomy. EXAM: CT CHEST, ABDOMEN, AND PELVIS WITH CONTRAST TECHNIQUE: Multidetector CT imaging of the chest, abdomen and pelvis was performed following the standard protocol during bolus administration of intravenous contrast. CONTRAST:  157m OMNIPAQUE IOHEXOL 300 MG/ML  SOLN COMPARISON:  08/29/2018 FINDINGS: CT CHEST FINDINGS Cardiovascular: Heart size within normal limits. Tiny pericardial effusion is similar to prior. Coronary artery calcification is evident. Atherosclerotic calcification is noted in the wall of the thoracic aorta. Right Port-A-Cath tip is positioned in the distal SVC near the RA junction. Mediastinum/Nodes: No mediastinal lymphadenopathy. There is no hilar lymphadenopathy. The esophagus  has normal imaging features. There is no axillary lymphadenopathy. Lungs/Pleura: Centrilobular emphsyema noted. Bandlike architectural distortion and scarring in the suprahilar right upper lobe is new in the interval. Irregular right upper lobe lesion seen posteriorly on the prior study is no longer evident and has apparently become incorporated into this process. Tiny right middle lobe nodule on 98/10 is similar to prior. 2 mm posterior right lower lobe nodule on 139/10 is new in the interval. 1.5 cm central left lower lobe pulmonary nodule (103/10) is similar to prior study. However, there is now an area of irregular consolidative opacity just cranial to this nodule with retraction of the major fissure and associated bronchiectasis, presumably related to radiation therapy. No evidence of pleural effusion. Musculoskeletal: Bones are diffusely demineralized. No worrisome lytic or sclerotic osseous abnormality. CT ABDOMEN PELVIS FINDINGS Hepatobiliary: 1.9 cm heterogeneously enhancing lesion is identified in the medial segment left liver (99/6). Multiple prior abdominal CTs dating back to 10/01/2017 have been performed without intravenous contrast material, but there was hyperenhancement in this region on the 10/01/2017 exam. Gallbladder is distended with intraluminal stones. Gallbladder regularity is similar to prior. No intrahepatic or extrahepatic biliary dilation. Pancreas: No focal mass lesion. No dilatation of the main duct. No intraparenchymal cyst. No peripancreatic edema. Spleen: No splenomegaly. No focal mass lesion. Adrenals/Urinary Tract: No adrenal nodule or mass. Right kidney surgically absent. Heterogeneously enhancing mass in the interpolar region and lower pole the right kidney measures at least 5.8 x 6.2 x 4.7 cm. This mass extends into the enlarged left renal vein where there is enhancing intraluminal soft tissue (coronal image 47 of series 7 and coronal image 43 of series 12). Tumor and bland  thrombus within the left renal vein extends centrally to the midline (image 108/series 6). Left ureter is nondilated. The urinary bladder appears normal for the degree of distention. Stomach/Bowel: Stomach is unremarkable. No gastric wall thickening. No evidence of outlet obstruction. Duodenum is normally positioned as is the ligament of Treitz. No small bowel wall thickening. No small bowel dilatation. The terminal ileum is normal. The appendix is not visualized, but there is no edema or inflammation in the region of the cecum. No gross colonic mass. No colonic wall thickening. Vascular/Lymphatic: There is abdominal aortic atherosclerosis without aneurysm. There is  no gastrohepatic or hepatoduodenal ligament lymphadenopathy. No intraperitoneal or retroperitoneal lymphadenopathy. No pelvic sidewall lymphadenopathy. Reproductive: The uterus is unremarkable.  There is no adnexal mass. Other: No intraperitoneal free fluid. Musculoskeletal: No worrisome lytic or sclerotic osseous abnormality. IMPRESSION: 1. Left renal mass again identified with evidence of left renal vein invasion. No substantial interval change although the lesion measures minimally larger today. 2. Right upper lobe pulmonary nodule is been incorporated into radiation scarring. The left lower lobe nodule is similar with adjacent scarring from prior radiation. 3. 1.9 cm heterogeneously enhancing region is identified in the medial segment left liver, slightly more conspicuous than on the most recent contrast infused CT of 10/01/2017. Continued close attention on follow-up recommended. 4. New tiny posterior right lower lobe pulmonary nodule. Attention on follow-up recommended. 5.  Aortic Atherosclerois (ICD10-170.0) 6.  Emphysema. ([id number]) Electronically Signed   By: Misty Stanley M.D.   On: 11/22/2018 15:52     ASSESSMENT/PLAN:  This is a very pleasant 73 year old African-American female diagnosed with metastatic renal cell carcinoma that was  initially diagnosed 2010.  CT-guided core biopsy of the bilateral pulmonary nodules was consistent with metastatic renal cell carcinoma.  She was treated with stereotactic radiotherapy to the hypermetabolic nodules without tissue diagnosis in the past.  The patient was treated with a combination of ipilimumab and nivolumab for 3 cycles but ipilimumab was discontinued secondary to significant itching that was intolerable for the patient. She was treated with 2 more cycles of single agent nivolumab which was discontinued secondary to disease progression. She is currently undergoing treatment with oral Cabometyx 40 mg p.o. Daily. Status post 86month of treatment. The patient attributes her insomnia and lack of appetite to Cabometyx. The patient is not having any symptoms today since restarting treatment on 11/19/2018.  The patient was seen with Dr. MJulien Nordmanntoday.  Labs were reviewed with the patient.  Dr. MJulien Nordmannrecommends that the patient continue on the same treatment at the same dose.   We will increase the patient's Remeron to 15 mg p.o. daily at bedtime for insomnia and lack of appetite.   She has an appointment with the nutritionist on 12/03/2018 for her history of weight loss.   We will see the patient back for a follow-up visit in 4 weeks for evaluation and repeat blood work.  The patient was advised to call immediately if she has any concerning symptoms in the interval. The patient voices understanding of current disease status and treatment options and is in agreement with the current care plan. All questions were answered. The patient knows to call the clinic with any problems, questions or concerns. We can certainly see the patient much sooner if necessary  Orders Placed This Encounter  Procedures  . CBC with Differential (Cancer Center Only)    Standing Status:   Future    Standing Expiration Date:   11/26/2019  . CMP ([place])    Standing Status:   Future    Standing  Expiration Date:   11/26/2019     CTobe SosHeilingoetter, PA-C 11/26/18  ADDENDUM: Hematology/Oncology Attending: I had a face-to-face encounter with the patient today.  I recommended her care plan.  This is a very pleasant 73years old African-American female with metastatic renal cell carcinoma and currently on treatment with Cabometyx 40 mg p.o. daily.  She has a lot of adverse effect of this treatment occluding significant fatigue and weakness as well as diarrhea and her treatment was held for around 7-10 days.  She resumed it 8 days ago and she has no concerning symptoms so far. She had repeat CT scan of the chest, abdomen pelvis performed recently.  I personally and independently reviewed the scans and discussed the results with the patient today. Her scan showed no concerning findings for disease progression. I recommended for the patient to continue her current treatment with Cabometyx with the same dose. We will see her back for follow-up visit in 1 months for evaluation and repeat blood work. She was advised to call immediately if she has any concerning symptoms in the interval.  Disclaimer: This note was dictated with voice recognition software. Similar sounding words can inadvertently be transcribed and may be missed upon review. Eilleen Kempf, MD 11/26/18

## 2018-11-27 ENCOUNTER — Telehealth: Payer: Self-pay | Admitting: Internal Medicine

## 2018-11-27 NOTE — Telephone Encounter (Signed)
Scheduled appt per 9/8 los - pt is aware of appt date and time

## 2018-12-03 ENCOUNTER — Inpatient Hospital Stay: Payer: Medicare Other

## 2018-12-03 NOTE — Progress Notes (Signed)
Nutrition Follow-up: Patient with metastatic renal cancer.  Patient taking carbometyx but has been off of it recently.  Restarted on 9/1.    Spoke with patient via phone this pm.  Patient reports that her appetite is better after stopping chemo pill.  Reports that she restarted it on 9/1 but appetite is still good.  Reports that she is now eating 3 meals per day vs 2.  Breakfast is eggs, sausage, biscuit.  Lunch is [place] ne[place], rice and gravy.  Supper last night was fish, no sides.  Reports that she is drinking 2 boost high protein shakes in the am with breakfast and sometimes will drink one in the afternoon.    Denies diarrhea.  Reports some nausea but controlled with medication.     Medications: reviewed  Labs: BUN 36, creatinine 1.14  Anthropometrics:   Weight 120 lb increased from 119 lb    NUTRITION DIAGNOSIS: Inadequate ora intake improving   INTERVENTION:  Reviewed ways to increase calories and protein with patient. Encouraged adding more plant foods to diet as well.  Encouraged higher calorie boost shake BID-TID.   Patient has contact information    MONITORING, EVALUATION, GOAL: Patient will consume adequate calories and protein to prevent further weight loss   NEXT VISIT: phone visit October 27  Melanie Best B. Zenia Resides, [place], [place] Registered Dietitian [phone] (pager)

## 2018-12-16 MED FILL — CABOMETYX 40 MG TABLET: 40 | 30 days supply | Qty: 30 | Fill #1

## 2018-12-27 ENCOUNTER — Telehealth: Payer: Self-pay | Admitting: Internal Medicine

## 2018-12-27 NOTE — Telephone Encounter (Signed)
Returned patient's phone call regarding rescheduling 10/12 appointments, per patient's request appointment moved to 10/13.

## 2018-12-30 ENCOUNTER — Inpatient Hospital Stay: Payer: Medicare Other

## 2018-12-30 ENCOUNTER — Inpatient Hospital Stay: Payer: Medicare Other | Admitting: Internal Medicine

## 2018-12-31 ENCOUNTER — Inpatient Hospital Stay (HOSPITAL_BASED_OUTPATIENT_CLINIC_OR_DEPARTMENT_OTHER): Payer: Medicare Other | Admitting: Internal Medicine

## 2018-12-31 ENCOUNTER — Encounter: Payer: Self-pay | Admitting: Internal Medicine

## 2018-12-31 ENCOUNTER — Inpatient Hospital Stay: Payer: Medicare Other | Attending: Internal Medicine

## 2018-12-31 ENCOUNTER — Other Ambulatory Visit: Payer: Self-pay

## 2018-12-31 VITALS — BP 131/83 | HR 102 | Temp 99.1°F | Resp 18 | Ht 65.0 in | Wt 121.0 lb

## 2018-12-31 DIAGNOSIS — R197 Diarrhea, unspecified: Secondary | ICD-10-CM | POA: Diagnosis not present

## 2018-12-31 DIAGNOSIS — C3432 Malignant neoplasm of lower lobe, left bronchus or lung: Secondary | ICD-10-CM | POA: Diagnosis not present

## 2018-12-31 DIAGNOSIS — C649 Malignant neoplasm of unspecified kidney, except renal pelvis: Secondary | ICD-10-CM | POA: Diagnosis not present

## 2018-12-31 DIAGNOSIS — C641 Malignant neoplasm of right kidney, except renal pelvis: Secondary | ICD-10-CM

## 2018-12-31 DIAGNOSIS — Z5111 Encounter for antineoplastic chemotherapy: Secondary | ICD-10-CM

## 2018-12-31 DIAGNOSIS — R634 Abnormal weight loss: Secondary | ICD-10-CM | POA: Diagnosis not present

## 2018-12-31 DIAGNOSIS — R630 Anorexia: Secondary | ICD-10-CM | POA: Insufficient documentation

## 2018-12-31 DIAGNOSIS — I10 Essential (primary) hypertension: Secondary | ICD-10-CM | POA: Diagnosis not present

## 2018-12-31 DIAGNOSIS — R5383 Other fatigue: Secondary | ICD-10-CM | POA: Insufficient documentation

## 2018-12-31 DIAGNOSIS — C7801 Secondary malignant neoplasm of right lung: Secondary | ICD-10-CM | POA: Insufficient documentation

## 2018-12-31 DIAGNOSIS — Z95828 Presence of other vascular implants and grafts: Secondary | ICD-10-CM

## 2018-12-31 DIAGNOSIS — E1165 Type 2 diabetes mellitus with hyperglycemia: Secondary | ICD-10-CM | POA: Diagnosis not present

## 2018-12-31 LAB — CMP (CANCER CENTER ONLY)
ALT: 33 U/L (ref 0–44)
AST: 26 U/L (ref 15–41)
Albumin: 3.4 g/dL — ABNORMAL LOW (ref 3.5–5.0)
Alkaline Phosphatase: 61 U/L (ref 38–126)
Anion gap: 13 (ref 5–15)
BUN: 20 mg/dL (ref 8–23)
CO2: 29 mmol/L (ref 22–32)
Calcium: 8.9 mg/dL (ref 8.9–10.3)
Chloride: 105 mmol/L (ref 98–111)
Creatinine: 1.05 mg/dL — ABNORMAL HIGH (ref 0.44–1.00)
GFR, Est AFR Am: >60 mL/min (ref >60)
GFR, Estimated: 53 mL/min — ABNORMAL LOW (ref >60)
Glucose, Bld: 104 mg/dL — ABNORMAL HIGH (ref 70–99)
Potassium: 3.8 mmol/L (ref 3.5–5.1)
Sodium: 147 mmol/L — ABNORMAL HIGH (ref 135–145)
Total Bilirubin: 0.3 mg/dL (ref 0.3–1.2)
Total Protein: 6.2 g/dL — ABNORMAL LOW (ref 6.5–8.1)

## 2018-12-31 LAB — CBC WITH DIFFERENTIAL (CANCER CENTER ONLY)
Abs Immature Granulocytes: 0.02 10*3/uL (ref 0.00–0.07)
Basophils Absolute: 0 10*3/uL (ref 0.0–0.1)
Basophils Relative: 0 %
Eosinophils Absolute: 0.1 10*3/uL (ref 0.0–0.5)
Eosinophils Relative: 1 %
HCT: 33.5 % — ABNORMAL LOW (ref 36.0–46.0)
Hemoglobin: 10.2 g/dL — ABNORMAL LOW (ref 12.0–15.0)
Immature Granulocytes: 0 %
Lymphocytes Relative: 28 %
Lymphs Abs: 1.8 10*3/uL (ref 0.7–4.0)
MCH: 37.1 pg — ABNORMAL HIGH (ref 26.0–34.0)
MCHC: 30.4 g/dL (ref 30.0–36.0)
MCV: 121.8 fL — ABNORMAL HIGH (ref 80.0–100.0)
Monocytes Absolute: 0.4 10*3/uL (ref 0.1–1.0)
Monocytes Relative: 5 %
Neutro Abs: 4.2 10*3/uL (ref 1.7–7.7)
Neutrophils Relative %: 66 %
Platelet Count: 181 10*3/uL (ref 150–400)
RBC: 2.75 MIL/uL — ABNORMAL LOW (ref 3.87–5.11)
RDW: 16.6 % — ABNORMAL HIGH (ref 11.5–15.5)
WBC Count: 6.5 10*3/uL (ref 4.0–10.5)
nRBC: 0.6 % — ABNORMAL HIGH (ref 0.0–0.2)

## 2018-12-31 MED ORDER — SODIUM CHLORIDE 0.9% FLUSH
10.0000 mL | INTRAVENOUS | Status: AC | PRN
  Administered 2018-12-31: 14:00:00 10 mL via INTRAVENOUS
  Filled 2018-12-31: qty 10

## 2018-12-31 MED ORDER — HEPARIN SOD (PORK) LOCK FLUSH 100 UNIT/ML IV SOLN
500.0000 [IU] | Freq: Once | INTRAVENOUS | Status: AC | PRN
  Administered 2018-12-31: 500 [IU] via INTRAVENOUS
  Filled 2018-12-31: qty 5

## 2018-12-31 NOTE — Addendum Note (Signed)
Addended by: Lenox Ponds E on: 12/31/2018 02:03 PM   Modules accepted: Orders

## 2018-12-31 NOTE — Progress Notes (Signed)
[facility] [phone]   [phone]  OFFICE PROGRESS NOTE  [name], MD [address] [place] [address]  DIAGNOSIS:  1) Metastatic renal cell carcinoma, clear cell type, WHO nuclear grade 3 initially diagnosed in September 2010 and recently presented with bilateral pulmonary nodules. 2) history of renal cell carcinoma status post right nephrectomy in September 2010.  PRIOR THERAPY: 1)Stereotactic radiotherapy to pulmonary nodules based on the metabolic activity with no tissue diagnosis. This was performed under the care of Dr. Pablo Ledger. 2)first line treatment with immunotherapy with Nivolumab 3 MG/KG and Ipilumumab 1 mg/Kg every 3 weeks for 4 cycles followed by maintenance Nivolumab every 3 weeks. First dose 12/27/2016. Status post 3cycles.Stopped due to severe itching. 3) Nivolumab480 mg IV every 4 weeksas a single agentstartingon 05/03/2017.Status post2cycles.  Discontinued secondary to disease progression.  CURRENT THERAPY: Cabometyx 40 mg daily.  First dose started on 07/04/2017.  Status post 18  months of treatment.  INTERVAL HISTORY: Melanie Best 73 y.o. female returns to the clinic today for follow-up visit.  The patient is feeling fine today with no concerning complaints except for fatigue and lack of appetite.  She is currently on Remeron 30 mg p.o. nightly.  She is also on prednisone 5 mg p.o. daily.  She denied having any current chest pain, shortness of breath, cough or hemoptysis.  She denied having any fever or chills.  She has no recent weight loss or night sweats.  She has no nausea, vomiting, diarrhea or constipation.  She is here today for evaluation and repeat blood work.  MEDICAL HISTORY: Past Medical History:  Diagnosis Date  . Anemia   . Anxiety   . Arthritis    R leg  . Cancer ([facility])    kidney  . Chronic fatigue 12/13/2016  . CKD (chronic kidney disease), stage II    removed R  kidney- 2010- for Cancer, followed by Dr. Diona Fanti  . COPD (chronic obstructive pulmonary disease) ([facility])    uses O2-2 liters , continuously , followed by Dr. Chase Caller  . Cough 08-02-2015  . Diabetes mellitus, type 2 ([facility])   . DM (diabetes mellitus) ([facility]) 01/27/2012  . DVT (deep venous thrombosis), left    2011- treated /w coumadin   . Dyslipidemia   . Dysrhythmia   . Esophageal reflux   . GERD (gastroesophageal reflux disease) 01/27/2012  . Glaucoma   . Goals of care, counseling/discussion 12/13/2016  . Hemoptysis 08-02-15  . History of radiation therapy 01/09/12,01/11/12,01/15/12,01/17/12,& 01/19/12   rul lung,50Gy/30fx  . HTN (hypertension) 01/27/2012  . Hyperlipidemia 01/27/2012  . Hypertension    sees Dr. Deforest Hoyles for PCP, denies ever having stress or ECHO, ?when & where she had  last  EKG  . Leukocytosis   . Lung cancer ([facility])    NSCLC  . Osteopenia   . Renal cell carcinoma of right kidney ([facility]) 12/13/2016  . Shortness of breath   . Syncope   . Tremor   . Urinary incontinence   . Vitamin D deficiency     ALLERGIES:  has No Known Allergies.  MEDICATIONS:  Current Outpatient Medications  Medication Sig Dispense Refill  . brinzolamide (AZOPT) 1 % ophthalmic suspension Place 1 drop into both eyes 3 (three) times daily. Reported on 08/12/2015    . CABOMETYX 40 MG tablet TAKE 1 TABLET (40MG ) BY MOUTH ONCE DAILY. TAKE ON AN EMPTY STOMACH 1 HOUR BEFORE OR 2 HOURS AFTER MEALS 30 tablet  1  . CALCIUM PO Take by mouth daily.    . clonazePAM (KLONOPIN) 1 MG tablet Take 1 mg by mouth 2 (two) times daily as needed. For anxiety    . diltiazem (CARDIZEM CD) 180 MG 24 hr capsule Take 180 mg by mouth daily.  0  . ergocalciferol (VITAMIN D2) 50000 UNITS capsule Take 50,000 Units by mouth once a week. Take on Monday    . furosemide (LASIX) 20 MG tablet Take 20 mg by mouth daily as needed. For fluid retention    . hydrocortisone cream 1 % Apply 1 application topically 2 (two) times daily. 30 g 0  .  ipratropium-albuterol (DUONEB) 0.5-2.5 (3) MG/3ML SOLN Take 3 mLs by nebulization 4 (four) times daily as needed. 360 mL 4  . JANUVIA 50 MG tablet Take 50 mg by mouth daily.     Marland Kitchen lidocaine-prilocaine (EMLA) cream Apply 1 application topically as needed.    . lovastatin (MEVACOR) 20 MG tablet Take 1 tablet by mouth daily.    Marland Kitchen LUMIGAN 0.01 % SOLN 1 drop daily.    . magic mouthwash w/lidocaine SOLN Take 5 mLs by mouth 4 (four) times daily as needed for mouth pain. Swish and spit before meals and at bedtime. 480 mL 0  . methocarbamol (ROBAXIN) 750 MG tablet Take 750 mg by mouth 2 (two) times daily as needed.  0  . mirtazapine (REMERON) 15 MG tablet Take 1 tablet (15 mg total) by mouth daily. 30 tablet 1  . mupirocin ointment (BACTROBAN) 2 % Place 1 application into the nose 2 (two) times daily. 30 g 1  . Nebulizers (VIOS AEROSOL DELIVERY SYSTEM) MISC USE UTD BY MD WITH NEBULIZER SOLUTION    . OXYGEN Inhale 2 L into the lungs continuous.    . pantoprazole (PROTONIX) 40 MG tablet Take 1 tablet by mouth daily.    . Potassium Chloride ER 20 MEQ TBCR Take 1 tablet by mouth daily.    . predniSONE (DELTASONE) 5 MG tablet Take 4 mg by mouth daily with breakfast.     . promethazine (PHENERGAN) 25 MG tablet take 1 tablet by mouth every 6 hours if needed for nausea  0   No current facility-administered medications for this visit.    Facility-Administered Medications Ordered in Other Visits  Medication Dose Route Frequency Provider Last Rate Last Dose  . sodium chloride flush (NS) 0.9 % injection 10 mL  10 mL Intravenous PRN Curt Bears, MD        SURGICAL HISTORY:  Past Surgical History:  Procedure Laterality Date  . BACK SURGERY     for pinched nerve, 2011, here at [facility]  . EYE SURGERY     cataracts removed, ?IOL  . FLEXIBLE BRONCHOSCOPY  11/29/2011  . IR FLUORO GUIDE PORT INSERTION RIGHT  01/30/2017  . IR US GUIDE VASC ACCESS RIGHT  01/30/2017  . removed blood clot     left upper abd quad    . right kidney removed    . right nephrectomy      REVIEW OF SYSTEMS:  A comprehensive review of systems was negative except for: Constitutional: positive for anorexia and fatigue Musculoskeletal: positive for arthralgias   PHYSICAL EXAMINATION: General appearance: alert, cooperative, fatigued and no distress Head: Normocephalic, without obvious abnormality, atraumatic Neck: no adenopathy, no JVD, supple, symmetrical, trachea midline and thyroid not enlarged, symmetric, no tenderness/mass/nodules Lymph nodes: Cervical, supraclavicular, and axillary nodes normal. Resp: clear to auscultation bilaterally Back: symmetric, no curvature. ROM normal. No CVA  tenderness. Cardio: regular rate and rhythm, S1, S2 normal, no murmur, click, rub or gallop GI: soft, non-tender; bowel sounds normal; no masses,  no organomegaly Extremities: extremities normal, atraumatic, no cyanosis or edema  ECOG PERFORMANCE STATUS: 1 - Symptomatic but completely ambulatory  Blood pressure 131/83, pulse (!) 102, temperature 99.1 F (37.3 C), temperature source Oral, resp. rate 18, height 5\' 5"  (1.651 m), weight 121 lb (54.9 kg), SpO2 97 %.  LABORATORY DATA: Lab Results  Component Value Date   WBC 6.5 12/31/2018   HGB 10.2 (L) 12/31/2018   HCT 33.5 (L) 12/31/2018   MCV 121.8 (H) 12/31/2018   PLT 181 12/31/2018      Chemistry      Component Value Date/Time   NA 143 11/22/2018 1323   NA 141 03/08/2017 1258   K 4.1 11/22/2018 1323   K 3.7 03/08/2017 1258   CL 105 11/22/2018 1323   CO2 25 11/22/2018 1323   CO2 23 03/08/2017 1258   BUN 36 (H) 11/22/2018 1323   BUN 24.1 03/08/2017 1258   CREATININE 1.14 (H) 11/22/2018 1323   CREATININE 1.4 (H) 03/08/2017 1258      Component Value Date/Time   CALCIUM 8.8 (L) 11/22/2018 1323   CALCIUM 8.8 03/08/2017 1258   ALKPHOS 62 11/22/2018 1323   ALKPHOS 65 03/08/2017 1258   AST 30 11/22/2018 1323   AST 8 03/08/2017 1258   ALT 44 11/22/2018 1323   ALT 20  03/08/2017 1258   BILITOT 0.2 (L) 11/22/2018 1323   BILITOT 0.40 03/08/2017 1258       RADIOGRAPHIC STUDIES: No results found.  ASSESSMENT AND PLAN: This is a very pleasant 73 years old African-American female with metastatic renal cell carcinoma that was initially diagnosed in 2010 and now presenting with bilateral pulmonary nodules. She was treated with stereotactic radiotherapy to hypermetabolic nodules without tissue diagnosis in the past. Recent imaging studies showed hypermetabolic and enlarging bilateral pulmonary nodules. CT-guided core biopsy of right pulmonary nodule was consistent with metastatic renal cell carcinoma. The patient was treated with a combination of ipilimumab and nivolumab for 3 cycles but ipilimumab was discontinued secondary to significant itching that was intolerable for the patient.  She was treated with 2 more cycles of single agent nivolumab which was discontinued secondary to disease progression. She is currently undergoing treatment with oral Cabometyx 40 mg p.o. Daily.  Status post 18 months of treatment. She has been tolerating this treatment well with no concerning adverse effect except for fatigue. I recommended for her to continue her current treatment with Cabometyx 40 mg p.o. daily. For the lack of appetite and weight loss, she is currently on Remeron 30 mg p.o. nightly in addition to prednisone 5 mg p.o. daily.  Will not be able to increase her prednisone dose because of the history of diabetes mellitus and uncontrolled hyperglycemia. For the diarrhea she will continue on Imodium on as-needed basis. The patient will have Port-A-Cath flush every 6 weeks. I will see her back for follow-up visit in 6 weeks for evaluation with repeat blood work. She was advised to call immediately if she has any concerning symptoms in the interval. The patient voices understanding of current disease status and treatment options and is in agreement with the current care  plan. All questions were answered. The patient knows to call the clinic with any problems, questions or concerns. We can certainly see the patient much sooner if necessary.  Disclaimer: This note was dictated with voice recognition software. Similar  sounding words can inadvertently be transcribed and may not be corrected upon review.

## 2019-01-01 ENCOUNTER — Telehealth: Payer: Self-pay | Admitting: Internal Medicine

## 2019-01-01 NOTE — Telephone Encounter (Signed)
Called pt per 10/13 los - pt is aware of appt date and time

## 2019-01-02 ENCOUNTER — Telehealth: Payer: Self-pay

## 2019-01-02 NOTE — Telephone Encounter (Signed)
Oral Oncology Patient Advocate Encounter  Was successful in securing patient a $10,000 grant from [facility] to provide copayment coverage for Cabometyx. This will keep the out of pocket expense at $0.   I called the patient and left a voicemail.   The billing information is as follows and has been shared with [place].   Member ID: [id number] Group ID: [id number] RxBin: [id number] Dates of Eligibility: 12/01/18 through 12/03/19  [place] Patient [place] Phone [phone] Fax [phone] 01/02/2019    12:32 PM

## 2019-01-09 ENCOUNTER — Other Ambulatory Visit: Payer: Self-pay | Admitting: Physician Assistant

## 2019-01-09 DIAGNOSIS — C641 Malignant neoplasm of right kidney, except renal pelvis: Secondary | ICD-10-CM

## 2019-01-14 ENCOUNTER — Inpatient Hospital Stay: Payer: Medicare Other

## 2019-01-14 NOTE — Progress Notes (Signed)
Nutrition Follow-up:  Patient with metastatic renal cancer.  Patient taking carbometyx 40mg .    Spoke with patient via phone for nutrition follow-up.  Patient reports that her appetite is good and that it has not declined after restarting chemo pill.  Reports that she is eating breakfast (eggs, sausage, 2 boost plus), lunch (sandwich -peanut butter, meat) and supper (meat and 2 vegetables/sides).  Reports that she has switched to boost plus for more calories.    Reports one episode of nausea night before last.  Unsure the cause.  Reports that she is having regular bowel movements.      Medications: remeron, predinsone  Labs: reviewed  Anthropometrics:   Weight 121 lb slight increase from 120 lb on 9/15   NUTRITION DIAGNOSIS: Inadequate oral intake stable   INTERVENTION:  Encouraged patient to continue eating 3 meals per day.  Patient will add food item to lunch time sandwich (yogurt, fruit or vegetable) Encouraged patient to add snack between meals.  Patient planning on adding snack between lunch and supper.   Continue with boost plus BID.  Will mail coupons Patient has contact information    MONITORING, EVALUATION, GOAL: Patient will consume adequate calories and protein to prevent further weight loss   NEXT VISIT: phone visit December 15  Bexley Laubach B. Zenia Resides, [place], [place] Registered Dietitian [phone] (pager)

## 2019-01-20 MED FILL — CABOMETYX 40 MG TABLET: 40 | 30 days supply | Qty: 30 | Fill #0

## 2019-01-23 ENCOUNTER — Other Ambulatory Visit: Payer: Self-pay | Admitting: *Deleted

## 2019-01-23 DIAGNOSIS — C649 Malignant neoplasm of unspecified kidney, except renal pelvis: Secondary | ICD-10-CM

## 2019-01-23 MED ORDER — MIRTAZAPINE 15 MG PO TABS: 15.0000 mg | ORAL_TABLET | Freq: Every day | ORAL | 2 refills | Status: DC

## 2019-02-10 ENCOUNTER — Telehealth: Payer: Self-pay | Admitting: Internal Medicine

## 2019-02-10 ENCOUNTER — Inpatient Hospital Stay: Payer: Medicare Other

## 2019-02-10 ENCOUNTER — Inpatient Hospital Stay: Payer: Medicare Other | Admitting: Internal Medicine

## 2019-02-10 NOTE — Telephone Encounter (Signed)
Re- Scheduled appt per 11/23 sch message - pt aware of new appt date and time

## 2019-02-11 ENCOUNTER — Ambulatory Visit: Payer: Medicare Other | Admitting: Internal Medicine

## 2019-02-11 ENCOUNTER — Other Ambulatory Visit: Payer: Medicare Other

## 2019-02-17 NOTE — Progress Notes (Signed)
[facility] OFFICE PROGRESS NOTE  [name], MD [address] [place] [address]  DIAGNOSIS:  1) Metastatic renal cell carcinoma, clear cell type, WHO nuclear grade 3 initially diagnosed in September 2010 and recently presented with bilateral pulmonary nodules. 2) history of renal cell carcinoma status post right nephrectomy in September 2010.  PRIOR THERAPY: 1)Stereotactic radiotherapy to pulmonary nodules based on the metabolic activity with no tissue diagnosis. This was performed under the care of Dr. Pablo Ledger. 2)first line treatment with immunotherapy with Nivolumab 3 MG/KG and Ipilumumab 1 mg/Kg every 3 weeks for 4 cycles followed by maintenance Nivolumab every 3 weeks. First dose 12/27/2016. Status post 3cycles.Stopped due to severe itching. 3)Nivolumab480 mg IV every 4 weeksas a single agentstartingon 05/03/2017.Status post2cycles.Discontinued secondary to disease progression.  CURRENT THERAPY: Cabometyx 40 mg daily.First dose started on 07/04/2017.  Status post 19  months of treatment.  INTERVAL HISTORY: Melanie Best 73 y.o. female returns to the clinic today for a follow-up visit.  The patient is feeling fine today with no concerning complaints. Her lack of appetite is better per patient report.  She is currently on Remeron 15 mg p.o. nightly but is requesting to reduce this to 7.5 mg due to recommendations from her PCP.  She is also on prednisone 5 mg p.o. daily.  She denied having any current chest pain, shortness of breath, cough or hemoptysis.  She denied having any fever or chills.  She has no recent weight loss or night sweats.  She has no nausea, vomiting, diarrhea or constipation. She denies any back pain.  She denies any hematuria or changes in her urinary patterns  She is here today for evaluation and repeat blood work.  MEDICAL HISTORY: Past Medical History:  Diagnosis Date  . Anemia   . Anxiety   . Arthritis    R  leg  . Cancer ([place])    kidney  . Chronic fatigue 12/13/2016  . CKD (chronic kidney disease), stage II    removed R kidney- 2010- for Cancer, followed by Dr. Diona Fanti  . COPD (chronic obstructive pulmonary disease) (H[facility])    uses O2-2 liters , continuously , followed by Dr. Chase Caller  . Cough 08-02-2015  . Diabetes mellitus, type 2 ([place])   . DM (diabetes mellitus) ([place]) 01/27/2012  . DVT (deep venous thrombosis), left    2011- treated /w coumadin   . Dyslipidemia   . Dysrhythmia   . Esophageal reflux   . GERD (gastroesophageal reflux disease) 01/27/2012  . Glaucoma   . Goals of care, counseling/discussion 12/13/2016  . Hemoptysis 08-02-15  . History of radiation therapy 01/09/12,01/11/12,01/15/12,01/17/12,& 01/19/12   rul lung,50Gy/44fx  . HTN (hypertension) 01/27/2012  . Hyperlipidemia 01/27/2012  . Hypertension    sees Dr. Deforest Hoyles for PCP, denies ever having stress or ECHO, ?when & where she had  last  EKG  . Leukocytosis   . Lung cancer ([place])    NSCLC  . Osteopenia   . Renal cell carcinoma of right kidney ([place]) 12/13/2016  . Shortness of breath   . Syncope   . Tremor   . Urinary incontinence   . Vitamin D deficiency     ALLERGIES:  has No Known Allergies.  MEDICATIONS:  Current Outpatient Medications  Medication Sig Dispense Refill  . brinzolamide (AZOPT) 1 % ophthalmic suspension Place 1 drop into both eyes 3 (three) times daily. Reported on 08/12/2015    . CABOMETYX 40 MG tablet TAKE 1 TABLET (40MG ) BY  MOUTH ONCE DAILY. TAKE ON AN EMPTY STOMACH 1 HOUR BEFORE OR 2 HOURS AFTER MEALS 30 tablet 1  . CALCIUM PO Take by mouth daily.    . clonazePAM (KLONOPIN) 1 MG tablet Take 1 mg by mouth 2 (two) times daily as needed. For anxiety    . diltiazem (CARDIZEM CD) 180 MG 24 hr capsule Take 180 mg by mouth daily.  0  . ergocalciferol (VITAMIN D2) 50000 UNITS capsule Take 50,000 Units by mouth once a week. Take on Monday    . furosemide (LASIX) 20 MG tablet Take 20 mg by mouth daily as  needed. For fluid retention    . hydrocortisone cream 1 % Apply 1 application topically 2 (two) times daily. 30 g 0  . ipratropium-albuterol (DUONEB) 0.5-2.5 (3) MG/3ML SOLN Take 3 mLs by nebulization 4 (four) times daily as needed. 360 mL 4  . JANUVIA 50 MG tablet Take 50 mg by mouth daily.     Marland Kitchen lidocaine-prilocaine (EMLA) cream Apply 1 application topically as needed.    . lovastatin (MEVACOR) 20 MG tablet Take 1 tablet by mouth daily.    Marland Kitchen LUMIGAN 0.01 % SOLN 1 drop daily.    . magic mouthwash w/lidocaine SOLN Take 5 mLs by mouth 4 (four) times daily as needed for mouth pain. Swish and spit before meals and at bedtime. 480 mL 0  . methocarbamol (ROBAXIN) 750 MG tablet Take 750 mg by mouth 2 (two) times daily as needed.  0  . mirtazapine (REMERON) 7.5 MG tablet Take 1 tablet (7.5 mg total) by mouth daily. 30 tablet 1  . mupirocin ointment (BACTROBAN) 2 % Place 1 application into the nose 2 (two) times daily. 30 g 1  . Nebulizers (VIOS AEROSOL DELIVERY SYSTEM) MISC USE UTD BY MD WITH NEBULIZER SOLUTION    . OXYGEN Inhale 2 L into the lungs continuous.    . pantoprazole (PROTONIX) 40 MG tablet Take 1 tablet by mouth daily.    . Potassium Chloride ER 20 MEQ TBCR Take 1 tablet by mouth daily.    . predniSONE (DELTASONE) 5 MG tablet Take 4 mg by mouth daily with breakfast.     . promethazine (PHENERGAN) 25 MG tablet take 1 tablet by mouth every 6 hours if needed for nausea  0   No current facility-administered medications for this visit.    Facility-Administered Medications Ordered in Other Visits  Medication Dose Route Frequency Provider Last Rate Last Dose  . sodium chloride flush (NS) 0.9 % injection 10 mL  10 mL Intravenous PRN Curt Bears, MD      . sodium chloride flush (NS) 0.9 % injection 10 mL  10 mL Intravenous PRN Curt Bears, MD   10 mL at 12/31/18 1348    SURGICAL HISTORY:  Past Surgical History:  Procedure Laterality Date  . BACK SURGERY     for pinched nerve,  2011, [facility] at [facility]  . EYE SURGERY     cataracts removed, ?IOL  . FLEXIBLE BRONCHOSCOPY  11/29/2011  . IR FLUORO GUIDE PORT INSERTION RIGHT  01/30/2017  . IR US GUIDE VASC ACCESS RIGHT  01/30/2017  . removed blood clot     left upper abd quad   . right kidney removed    . right nephrectomy      REVIEW OF SYSTEMS:   Review of Systems  Constitutional: Negative for appetite change, chills, fatigue, fever and unexpected weight change.  HENT: Negative for mouth sores, nosebleeds, sore throat and trouble swallowing.  Eyes: Negative for eye problems and icterus.  Respiratory: Negative for cough, hemoptysis, shortness of breath and wheezing.   Cardiovascular: Negative for chest pain and leg swelling.  Gastrointestinal: Negative for abdominal pain, constipation, diarrhea, nausea and vomiting.  Genitourinary: Negative for bladder incontinence, difficulty urinating, dysuria, frequency and hematuria.   Musculoskeletal: Negative for back pain, gait problem, neck pain and neck stiffness.  Skin: Negative for itching and rash.  Neurological: Negative for dizziness, extremity weakness, gait problem, headaches, light-headedness and seizures.  Hematological: Negative for adenopathy. Does not bruise/bleed easily.  Psychiatric/Behavioral: Negative for confusion, depression and sleep disturbance. The patient is not nervous/anxious.     PHYSICAL EXAMINATION:  Blood pressure 122/71, pulse (!) 102, temperature 98.2 F (36.8 C), temperature source Temporal, resp. rate 18, height 5\' 5"  (1.651 m), weight 123 lb 12.8 oz (56.2 kg), SpO2 99 %.  ECOG PERFORMANCE STATUS: 1 - Symptomatic but completely ambulatory  Physical Exam  Head: Normocephalic and atraumatic.  Mouth/Throat: Oropharynx is clear and moist. No oropharyngeal exudate.  Eyes: Conjunctivae are normal. Right eye exhibits no discharge. Left eye exhibits no discharge. No scleral icterus.  Neck: Normal range of motion. Neck supple.  Cardiovascular:  Normal rate, regular rhythm, normal heart sounds and intact distal pulses.   Pulmonary/Chest: Effort normal and breath sounds normal. No respiratory distress. No wheezes. No rales.  Abdominal: Soft. Bowel sounds are normal. Exhibits no distension and no mass. There is no tenderness.  Musculoskeletal: Normal range of motion. Exhibits no edema.  Lymphadenopathy:    No cervical adenopathy.  Neurological: Essential tremor/head titubation noted on exam. Alert and oriented to person, place, and time. Exhibits normal muscle tone. Gait normal. Coordination normal.  Skin: Skin is warm and dry. No rash noted. Not diaphoretic. No erythema. No pallor.  Psychiatric: Mood, memory and judgment normal.  Vitals reviewed.  LABORATORY DATA: Lab Results  Component Value Date   WBC 5.1 02/18/2019   HGB 8.6 (L) 02/18/2019   HCT 28.5 (L) 02/18/2019   MCV 123.9 (H) 02/18/2019   PLT 151 02/18/2019      Chemistry      Component Value Date/Time   NA 147 (H) 02/18/2019 1430   NA 141 03/08/2017 1258   K 3.3 (L) 02/18/2019 1430   K 3.7 03/08/2017 1258   CL 105 02/18/2019 1430   CO2 27 02/18/2019 1430   CO2 23 03/08/2017 1258   BUN 22 02/18/2019 1430   BUN 24.1 03/08/2017 1258   CREATININE 1.13 (H) 02/18/2019 1430   CREATININE 1.4 (H) 03/08/2017 1258      Component Value Date/Time   CALCIUM 8.8 (L) 02/18/2019 1430   CALCIUM 8.8 03/08/2017 1258   ALKPHOS 66 02/18/2019 1430   ALKPHOS 65 03/08/2017 1258   AST 19 02/18/2019 1430   AST 8 03/08/2017 1258   ALT 29 02/18/2019 1430   ALT 20 03/08/2017 1258   BILITOT 0.3 02/18/2019 1430   BILITOT 0.40 03/08/2017 1258       RADIOGRAPHIC STUDIES:  No results found.   ASSESSMENT/PLAN:  This is a very pleasant 73 year old African-American female diagnosed with metastatic renal cell carcinoma that was initially diagnosed 2010. CT-guided core biopsy of the bilateral pulmonary nodules was consistent with metastatic renal cell carcinoma. She was treated  with stereotactic radiotherapy to the hypermetabolic nodules without tissue diagnosis in the past.  The patient was treated with a combination of ipilimumab and nivolumab for 3 cycles but ipilimumab was discontinued secondary to significant itching that was intolerable for the  patient. She was treated with 2 more cycles of single agent nivolumab which was discontinued secondary to disease progression. She is currently undergoing treatment with oral Cabometyx 40 mg p.o. Daily. Status post 60months of treatment. The patient attributes her insomnia and lack of appetite to Cabometyx although she states her lack of appetite is improving.    Labs were reviewed with the patient. Her potassium is slightly low today. She was given a handout on potassium rich foods and was encouraged to increase her dietary intake of potassium. We recommend that the patient continue on the same treatment at the same dose.   I will arrange for a restaging CT scan of the chest, abdomen, and pelvis to be performed prior to her next appointment to restage her disease.   We will see her back for a follow up visit in 6 weeks for evaluation and to review her CT scan.   The patient requested that I refill her Remeron for 7.5 mg p.o. nightly which is prescribed by our office. The patient was recently prescribed trazodone last week by her PCP. There is a drug-drug inaction between Remeron and trazodone. The patient will reach out to her PCP to see if this can be discontinued or for their recommendations. The patient states that she would prefer to stay on remeron.   The patient was advised to call immediately if she has any concerning symptoms in the interval. The patient voices understanding of current disease status and treatment options and is in agreement with the current care plan. All questions were answered. The patient knows to call the clinic with any problems, questions or concerns. We can certainly see the patient much  sooner if necessary   Orders Placed This Encounter  Procedures  . CT Chest W Contrast    Standing Status:   Future    Standing Expiration Date:   02/18/2020    Order Specific Question:   ** REASON FOR EXAM (FREE TEXT)    Answer:   Restaging Renal Cell Carcinoma    Order Specific Question:   If indicated for the ordered procedure, I authorize the administration of contrast media per Radiology protocol    Answer:   Yes    Order Specific Question:   Preferred imaging location?    Answer:   [facility]    Order Specific Question:   Radiology Contrast Protocol - do NOT remove file path    Answer:   \\[email or web address]  . CT Abdomen Pelvis W Wo Contrast    This exam should ONLY be ordered for initial diagnosis or follow up of known pancreatic/liver/renal/bladder masses.    Standing Status:   Future    Standing Expiration Date:   05/18/2020    Order Specific Question:   ** REASON FOR EXAM (FREE TEXT)    Answer:   Restaging Renal cell carcinoma    Order Specific Question:   If indicated for the ordered procedure, I authorize the administration of contrast media per Radiology protocol    Answer:   Yes    Order Specific Question:   Preferred imaging location?    Answer:   [facility]    Order Specific Question:   Is Oral Contrast requested for this exam?    Answer:   Yes, Per Radiology protocol    Order Specific Question:   Radiology Contrast Protocol - do NOT remove file path    Answer:   \\[email or web address]  . CBC with Differential ([place] Only)  Standing Status:   Future    Standing Expiration Date:   02/18/2020  . CMP ([place] only)    Standing Status:   Future    Standing Expiration Date:   02/18/2020     Cassandra L Heilingoetter, PA-C 02/18/19

## 2019-02-18 ENCOUNTER — Inpatient Hospital Stay (HOSPITAL_BASED_OUTPATIENT_CLINIC_OR_DEPARTMENT_OTHER): Payer: Medicare Other | Admitting: Physician Assistant

## 2019-02-18 ENCOUNTER — Inpatient Hospital Stay: Payer: Medicare Other

## 2019-02-18 ENCOUNTER — Other Ambulatory Visit: Payer: Self-pay

## 2019-02-18 ENCOUNTER — Telehealth: Payer: Self-pay | Admitting: Physician Assistant

## 2019-02-18 ENCOUNTER — Inpatient Hospital Stay: Payer: Medicare Other | Attending: Internal Medicine

## 2019-02-18 VITALS — BP 122/71 | HR 102 | Temp 98.2°F | Resp 18 | Ht 65.0 in | Wt 123.8 lb

## 2019-02-18 DIAGNOSIS — Z95828 Presence of other vascular implants and grafts: Secondary | ICD-10-CM

## 2019-02-18 DIAGNOSIS — G4700 Insomnia, unspecified: Secondary | ICD-10-CM | POA: Diagnosis not present

## 2019-02-18 DIAGNOSIS — R630 Anorexia: Secondary | ICD-10-CM | POA: Diagnosis not present

## 2019-02-18 DIAGNOSIS — E876 Hypokalemia: Secondary | ICD-10-CM | POA: Diagnosis not present

## 2019-02-18 DIAGNOSIS — C7801 Secondary malignant neoplasm of right lung: Secondary | ICD-10-CM | POA: Diagnosis not present

## 2019-02-18 DIAGNOSIS — C641 Malignant neoplasm of right kidney, except renal pelvis: Secondary | ICD-10-CM | POA: Insufficient documentation

## 2019-02-18 DIAGNOSIS — C649 Malignant neoplasm of unspecified kidney, except renal pelvis: Secondary | ICD-10-CM

## 2019-02-18 LAB — CMP (CANCER CENTER ONLY)
ALT: 29 U/L (ref 0–44)
AST: 19 U/L (ref 15–41)
Albumin: 3.5 g/dL (ref 3.5–5.0)
Alkaline Phosphatase: 66 U/L (ref 38–126)
Anion gap: 15 (ref 5–15)
BUN: 22 mg/dL (ref 8–23)
CO2: 27 mmol/L (ref 22–32)
Calcium: 8.8 mg/dL — ABNORMAL LOW (ref 8.9–10.3)
Chloride: 105 mmol/L (ref 98–111)
Creatinine: 1.13 mg/dL — ABNORMAL HIGH (ref 0.44–1.00)
GFR, Est AFR Am: 57 mL/min — ABNORMAL LOW (ref >60)
GFR, Estimated: 49 mL/min — ABNORMAL LOW (ref >60)
Glucose, Bld: 114 mg/dL — ABNORMAL HIGH (ref 70–99)
Potassium: 3.3 mmol/L — ABNORMAL LOW (ref 3.5–5.1)
Sodium: 147 mmol/L — ABNORMAL HIGH (ref 135–145)
Total Bilirubin: 0.3 mg/dL (ref 0.3–1.2)
Total Protein: 6.2 g/dL — ABNORMAL LOW (ref 6.5–8.1)

## 2019-02-18 LAB — CBC WITH DIFFERENTIAL (CANCER CENTER ONLY)
Abs Immature Granulocytes: 0.07 10*3/uL (ref 0.00–0.07)
Basophils Absolute: 0 10*3/uL (ref 0.0–0.1)
Basophils Relative: 0 %
Eosinophils Absolute: 0 10*3/uL (ref 0.0–0.5)
Eosinophils Relative: 0 %
HCT: 28.5 % — ABNORMAL LOW (ref 36.0–46.0)
Hemoglobin: 8.6 g/dL — ABNORMAL LOW (ref 12.0–15.0)
Immature Granulocytes: 1 %
Lymphocytes Relative: 19 %
Lymphs Abs: 1 10*3/uL (ref 0.7–4.0)
MCH: 37.4 pg — ABNORMAL HIGH (ref 26.0–34.0)
MCHC: 30.2 g/dL (ref 30.0–36.0)
MCV: 123.9 fL — ABNORMAL HIGH (ref 80.0–100.0)
Monocytes Absolute: 0.3 10*3/uL (ref 0.1–1.0)
Monocytes Relative: 5 %
Neutro Abs: 3.8 10*3/uL (ref 1.7–7.7)
Neutrophils Relative %: 75 %
Platelet Count: 151 10*3/uL (ref 150–400)
RBC: 2.3 MIL/uL — ABNORMAL LOW (ref 3.87–5.11)
RDW: 19.9 % — ABNORMAL HIGH (ref 11.5–15.5)
WBC Count: 5.1 10*3/uL (ref 4.0–10.5)
nRBC: 5.1 % — ABNORMAL HIGH (ref 0.0–0.2)

## 2019-02-18 LAB — TSH: TSH: 2.835 u[IU]/mL (ref 0.308–3.960)

## 2019-02-18 MED ORDER — SODIUM CHLORIDE 0.9% FLUSH
10.0000 mL | INTRAVENOUS | Status: DC | PRN
  Administered 2019-02-18: 10 mL via INTRAVENOUS
  Filled 2019-02-18: qty 10

## 2019-02-18 MED ORDER — HEPARIN SOD (PORK) LOCK FLUSH 100 UNIT/ML IV SOLN
500.0000 [IU] | Freq: Once | INTRAVENOUS | Status: AC | PRN
  Administered 2019-02-18: 500 [IU] via INTRAVENOUS
  Filled 2019-02-18: qty 5

## 2019-02-18 MED ORDER — MIRTAZAPINE 7.5 MG PO TABS: 7.5000 mg | ORAL_TABLET | Freq: Every day | ORAL | 1 refills | Status: DC

## 2019-02-18 MED FILL — CABOMETYX 40 MG TABLET: 40 | 30 days supply | Qty: 30 | Fill #1

## 2019-02-18 NOTE — Patient Instructions (Signed)

## 2019-02-18 NOTE — Patient Instructions (Signed)
Potassium Content of Foods  Potassium is a mineral found in many foods and drinks. It affects how the heart works, and helps keep fluids and minerals balanced in the body. The amount of potassium you need each day depends on your age and any medical conditions you may have. Talk to your health care provider or dietitian about how much potassium you need. The following lists of foods provide the general serving size for foods and the approximate amount of potassium in each serving, listed in milligrams (mg). Actual values may vary depending on the product and how it is processed. High in potassium The following foods and beverages have 200 mg or more of potassium per serving:  Apricots (raw) - 2 have 200 mg of potassium.  Apricots (dry) - 5 have 200 mg of potassium.  Artichoke - 1 medium has 345 mg of potassium.  Avocado -  fruit has 245 mg of potassium.  Banana - 1 medium fruit has 425 mg of potassium.  B[place]ette or baked beans (canned) -  cup has 280 mg of potassium.  White beans (canned) -  cup has 595 mg potassium.  Beef roast - 3 oz has 320 mg of potassium.  Ground beef - 3 oz has 270 mg of potassium.  Beets (raw or cooked) -  cup has 260 mg of potassium.  Bran muffin - 2 oz has 300 mg of potassium.  Broccoli (cooked) -  cup has 230 mg of potassium.  Brussels sprouts -  cup has 250 mg of potassium.  Cantaloupe -  cup has 215 mg of potassium.  Cereal, 100% bran -  cup has 200-400 mg of potassium.  Cheeseburger -1 single fast food burger has 225-400 mg of potassium.  Chicken - 3 oz has 220 mg of potassium.  Clams (canned) - 3 oz has 535 mg of potassium.  Crab - 3 oz has 225 mg of potassium.  Dates - 5 have 270 mg of potassium.  Dried beans and peas -  cup has 300-475 mg of potassium.  Figs (dried) - 2 have 260 mg of potassium.  Fish (halibut, tuna, cod, snapper) - 3 oz has 480 mg of potassium.  Fish (salmon, haddock, swordfish, perch) - 3 oz has 300 mg of  potassium.  Fish (tuna, canned) - 3 oz has 200 mg of potassium.  [place] fries (fast food) - 3 oz has 470 mg of potassium.  Granola with fruit and nuts -  cup has 200 mg of potassium.  Grapefruit juice -  cup has 200 mg of potassium.  Honeydew melon -  cup has 200 mg of potassium.  Kale (raw) - 1 cup has 300 mg of potassium.  Kiwi - 1 medium fruit has 240 mg of potassium.  Kohlrabi, rutabaga, parsnips -  cup has 280 mg of potassium.  Lentils -  cup has 365 mg of potassium.  Mango - 1 each has 325 mg of potassium.  Milk (nonfat, low-fat, whole, buttermilk) - 1 cup has 350-380 mg of potassium.  Milk (chocolate) - 1 cup has 420 mg of potassium  Molasses - 1 Tbsp has 295 mg of potassium.  Mushrooms -  cup has 280 mg of potassium.  Nectarine - 1 each has 275 mg of potassium.  Nuts (almonds, peanuts, hazelnuts, [place], cashew, mixed) - 1 oz has 200 mg of potassium.  Nuts (pistachios) - 1 oz has 295 mg of potassium.  Orange - 1 fruit has 240 mg of potassium.  Orange juice -  cup has 235 mg of potassium.  Papaya -  medium fruit has 390 mg of potassium.  Peanut butter (chunky) - 2 Tbsp has 240 mg of potassium.  Peanut butter (smooth) - 2 Tbsp has 210 mg of potassium.  Pear - 1 medium (200 mg) of potassium.  Pomegranate - 1 whole fruit has 400 mg of potassium.  Pomegranate juice -  cup has 215 mg of potassium.  Pork - 3 oz has 350 mg of potassium.  Potato chips (salted) - 1 oz has 465 mg of potassium.  Potato (baked with skin) - 1 medium has 925 mg of potassium.  Potato (boiled) -  cup has 255 mg of potassium.  Potato (Mashed) -  cup has 330 mg of potassium.  Prune juice -  cup has 370 mg of potassium.  Prunes - 5 have 305 mg of potassium.  Pudding (chocolate) -  cup has 230 mg of potassium.  Pumpkin (canned) -  cup has 250 mg of potassium.  Raisins (seedless) -  cup has 270 mg of potassium.  Seeds (sunflower or pumpkin) - 1 oz has 240 mg of  potassium.  Soy milk - 1 cup has 300 mg of potassium.  Spinach (cooked) - 1/2 cup has 420 mg of potassium.  Spinach (canned) -  cup has 370 mg of potassium.  Sweet potato (baked with skin) - 1 medium has 450 mg of potassium.  Swiss chard -  cup has 480 mg of potassium.  Tomato or vegetable juice -  cup has 275 mg of potassium.  Tomato (sauce or puree) -  cup has 400-550 mg of potassium.  Tomato (raw) - 1 medium has 290 mg of potassium.  Tomato (canned) -  cup has 200-300 mg of potassium.  [place] - 3 oz has 250 mg of potassium.  Wheat germ - 1 oz has 250 mg of potassium.  Winter squash -  cup has 250 mg of potassium.  Yogurt (plain or fruited) - 6 oz has 260-435 mg of potassium.  Zucchini -  cup has 220 mg of potassium. Moderate in potassium The following foods and beverages have 50-200 mg of potassium per serving:  Apple - 1 fruit has 150 mg of potassium  Apple juice -  cup has 150 mg of potassium  Applesauce -  cup has 90 mg of potassium  Apricot nectar -  cup has 140 mg of potassium  Asparagus (small spears) -  cup has 155 mg of potassium  Asparagus (large spears) - 6 have 155 mg of potassium  Bagel (cinnamon raisin) - 1 four-inch bagel has 130 mg of potassium  Bagel (egg or plain) - 1 four- inch bagel has 70 mg of potassium  Beans (green) -  cup has 90 mg of potassium  Beans (yellow) -  cup has 190 mg of potassium  Beer, regular - 12 oz has 100 mg of potassium  Beets (canned) -  cup has 125 mg of potassium  Blackberries -  cup has 115 mg of potassium  Blueberries -  cup has 60 mg of potassium  Bread (whole wheat) - 1 slice has 70 mg of potassium  Broccoli (raw) -  cup has 145 mg of potassium  Cabbage -  cup has 150 mg of potassium  Carrots (cooked or raw) -  cup has 180 mg of potassium  Cauliflower (raw) -  cup has 150 mg of potassium  Celery (raw) -  cup has 155 mg of potassium  Cereal, bran  flakes -  cup has 120-150 mg  of potassium  Cheese (cottage) -  cup has 110 mg of potassium  Cherries - 10 have 150 mg of potassium  Chocolate - 1 oz bar has 165 mg of potassium  Coffee (brewed) - 6 oz has 90 mg of potassium  Corn -  cup or 1 ear has 195 mg of potassium  Cucumbers -  cup has 80 mg of potassium  Egg - 1 large egg has 60 mg of potassium  Eggplant -  cup has 60 mg of potassium  Endive (raw) -  cup has 80 mg of potassium  English muffin - 1 has 65 mg of potassium  Fish (ocean perch) - 3 oz has 192 mg of potassium  Frankfurter, beef or pork - 1 has 75 mg of potassium  Fruit cocktail -  cup has 115 mg of potassium  Grape juice -  cup has 170 mg of potassium  Grapefruit -  fruit has 175 mg of potassium  Grapes -  cup has 155 mg of potassium  Greens: kale, turnip, collard -  cup has 110-150 mg of potassium  Ice cream or frozen yogurt (chocolate) -  cup has 175 mg of potassium  Ice cream or frozen yogurt (vanilla) -  cup has 120-150 mg of potassium  Lemons, limes - 1 each has 80 mg of potassium  Lettuce - 1 cup has 100 mg of potassium  Mixed vegetables -  cup has 150 mg of potassium  Mushrooms, raw -  cup has 110 mg of potassium  Nuts (walnuts, pecans, or macadamia) - 1 oz has 125 mg of potassium  Oatmeal -  cup has 80 mg of potassium  Okra -  cup has 110 mg of potassium  Onions -  cup has 120 mg of potassium  Peach - 1 has 185 mg of potassium  Peaches (canned) -  cup has 120 mg of potassium  Pears (canned) -  cup has 120 mg of potassium  Peas, green (frozen) -  cup has 90 mg of potassium  Peppers (Green) -  cup has 130 mg of potassium  Peppers (Red) -  cup has 160 mg of potassium  Pineapple juice -  cup has 165 mg of potassium  Pineapple (fresh or canned) -  cup has 100 mg of potassium  Plums - 1 has 105 mg of potassium  Pudding, vanilla -  cup has 150 mg of potassium  Raspberries -  cup has 90 mg of potassium  Rhubarb -  cup has  115 mg of potassium  Rice, wild -  cup has 80 mg of potassium  Shrimp - 3 oz has 155 mg of potassium  Spinach (raw) - 1 cup has 170 mg of potassium  Strawberries -  cup has 125 mg of potassium  Summer squash -  cup has 175-200 mg of potassium  Swiss chard (raw) - 1 cup has 135 mg of potassium  Tangerines - 1 fruit has 140 mg of potassium  Tea, brewed - 6 oz has 65 mg of potassium  Turnips -  cup has 140 mg of potassium  Watermelon -  cup has 85 mg of potassium  Wine (Red, table) - 5 oz has 180 mg of potassium  Wine (White, table) - 5 oz 100 mg of potassium Low in potassium The following foods and beverages have less than 50 mg of potassium per serving.  Bread (white) - 1 slice has 30 mg of potassium  Carbonated beverages - 12 oz has less than 5 mg of potassium  Cheese - 1 oz has 20-30 mg of potassium  Cranberries -  cup has 45 mg of potassium  Cranberry juice cocktail -  cup has 20 mg of potassium  Fats and oils - 1 Tbsp has less than 5 mg of potassium  Hummus - 1 Tbsp has 32 mg of potassium  Nectar (papaya, mango, or pear) -  cup has 35 mg of potassium  Rice (white or brown) -  cup has 50 mg of potassium  Spaghetti or macaroni (cooked) -  cup has 30 mg of potassium  Tortilla, flour or corn - 1 has 50 mg of potassium  Waffle - 1 four-inch waffle has 50 mg of potassium  Water chestnuts -  cup has 40 mg of potassium Summary  Potassium is a mineral found in many foods and drinks. It affects how the heart works, and helps keep fluids and minerals balanced in the body.  The amount of potassium you need each day depends on your age and any existing medical conditions you may have. Your health care provider or dietitian may recommend an amount of potassium that you should have each day. This information is not intended to replace advice given to you by your health care provider. Make sure you discuss any questions you have with your health care provider.  Document Released: 10/18/2004 Document Revised: 02/16/2017 Document Reviewed: 05/31/2016 Elsevier Patient Education  [employer].

## 2019-02-18 NOTE — Telephone Encounter (Signed)
Gave avs and calendar ° °

## 2019-02-18 NOTE — Progress Notes (Signed)
Blood return noted just wasn't enough for labs so I drew blood peripherally out of right arm

## 2019-02-27 ENCOUNTER — Other Ambulatory Visit: Payer: Self-pay | Admitting: *Deleted

## 2019-02-27 DIAGNOSIS — C649 Malignant neoplasm of unspecified kidney, except renal pelvis: Secondary | ICD-10-CM

## 2019-02-27 MED ORDER — MIRTAZAPINE 7.5 MG PO TABS: 7.5000 mg | ORAL_TABLET | Freq: Every day | ORAL | 1 refills | Status: DC

## 2019-03-03 ENCOUNTER — Telehealth: Payer: Self-pay

## 2019-03-03 NOTE — Telephone Encounter (Signed)
Confirmed telephone visit on 12/15 with pt and verified pt's demographics

## 2019-03-04 ENCOUNTER — Inpatient Hospital Stay: Payer: Medicare Other

## 2019-03-04 NOTE — Progress Notes (Signed)
Nutrition Follow-up:  Patient with metastatic renal cancer.  Patient taking carbometyx.    Spoke with patient via phone for nutrition follow-up.  Reports that her appetite is better.  Reports that she got up around 9am this morning and ate 3 eggs, 3 biscuits and juice around 11am.  Dinner last night was chicken thigh, lima beans and mashed potatoes and biscuit.  Reports that she is still drinking 2 boost plus shakes daily (around noon and 2pm). Snacks on chips and peaches and cream.    Denies nausea and reports bowels are moving regularly.      Medications: remeron, predinsone  Labs: reviewed  Anthropometrics:   Weight increased to 123 lb 12.8 oz on 12/1 from 121 lb  NUTRITION DIAGNOSIS: Inadequate oral intake improved   INTERVENTION:  Encouraged patient to continue eating regular meals with snacks.   Encouraged patient to continue to drink boost plus shakes for additional calories and protein BID. Will mail coupons.  Patient has contact information    MONITORING, EVALUATION, GOAL: Patient will consume adequate calories and protein to prevent weight loss   NEXT VISIT: Feb 23 phone f/u   TRUE Shackleford B. Zenia Resides, [place], [place] Registered Dietitian [phone] (pager)

## 2019-03-17 ENCOUNTER — Telehealth: Payer: Self-pay

## 2019-03-17 ENCOUNTER — Other Ambulatory Visit: Payer: Self-pay | Admitting: Physician Assistant

## 2019-03-17 DIAGNOSIS — C641 Malignant neoplasm of right kidney, except renal pelvis: Secondary | ICD-10-CM

## 2019-03-17 NOTE — Telephone Encounter (Signed)
Oral Oncology Patient Advocate Encounter  Prior Authorization for Cabometyx has been approved.    PA# [id number] Effective dates: 03/17/19 through 03/19/20   Oral Oncology Clinic will continue to follow.   [place] Patient [place] Phone [phone] Fax [phone] 03/17/2019 2:26 PM

## 2019-03-17 NOTE — Telephone Encounter (Signed)
Oral Oncology Patient Advocate Encounter  Received notification from [employer] that prior authorization for Cabometyx is required.  PA submitted on CoverMyMeds Key [id number] Status is pending  Oral Oncology Clinic will continue to follow.  [place] Patient [place] Phone [phone] Fax [phone] 03/17/2019 2:25 PM

## 2019-03-18 MED FILL — CABOMETYX 40 MG TABLET: 40 | 30 days supply | Qty: 30 | Fill #0

## 2019-03-25 ENCOUNTER — Inpatient Hospital Stay: Payer: Medicare Other | Attending: Internal Medicine

## 2019-03-25 ENCOUNTER — Other Ambulatory Visit: Payer: Self-pay

## 2019-03-25 DIAGNOSIS — C7801 Secondary malignant neoplasm of right lung: Secondary | ICD-10-CM | POA: Insufficient documentation

## 2019-03-25 DIAGNOSIS — R630 Anorexia: Secondary | ICD-10-CM | POA: Insufficient documentation

## 2019-03-25 DIAGNOSIS — R911 Solitary pulmonary nodule: Secondary | ICD-10-CM | POA: Diagnosis not present

## 2019-03-25 DIAGNOSIS — C641 Malignant neoplasm of right kidney, except renal pelvis: Secondary | ICD-10-CM | POA: Diagnosis present

## 2019-03-25 DIAGNOSIS — G4700 Insomnia, unspecified: Secondary | ICD-10-CM | POA: Diagnosis not present

## 2019-03-25 DIAGNOSIS — Z95828 Presence of other vascular implants and grafts: Secondary | ICD-10-CM

## 2019-03-25 MED ORDER — SODIUM CHLORIDE 0.9% FLUSH
10.0000 mL | INTRAVENOUS | Status: DC | PRN
  Filled 2019-03-25: qty 10

## 2019-03-25 MED ORDER — HEPARIN SOD (PORK) LOCK FLUSH 100 UNIT/ML IV SOLN
500.0000 [IU] | Freq: Once | INTRAVENOUS | Status: DC | PRN
  Filled 2019-03-25: qty 5

## 2019-03-26 ENCOUNTER — Ambulatory Visit (HOSPITAL_COMMUNITY)
Admission: RE | Admit: 2019-03-26 | Discharge: 2019-03-26 | Disposition: A | Discharge status: Home / Self Care | Payer: Medicare Other | Source: Ambulatory Visit | Attending: Physician Assistant | Admitting: Physician Assistant

## 2019-03-26 ENCOUNTER — Telehealth: Payer: Self-pay | Admitting: Physician Assistant

## 2019-03-26 ENCOUNTER — Other Ambulatory Visit: Payer: Self-pay | Admitting: Physician Assistant

## 2019-03-26 ENCOUNTER — Encounter (HOSPITAL_COMMUNITY): Payer: Self-pay

## 2019-03-26 DIAGNOSIS — Z85528 Personal history of other malignant neoplasm of kidney: Secondary | ICD-10-CM | POA: Diagnosis not present

## 2019-03-26 DIAGNOSIS — C642 Malignant neoplasm of left kidney, except renal pelvis: Secondary | ICD-10-CM | POA: Insufficient documentation

## 2019-03-26 DIAGNOSIS — J984 Other disorders of lung: Secondary | ICD-10-CM | POA: Diagnosis not present

## 2019-03-26 DIAGNOSIS — C649 Malignant neoplasm of unspecified kidney, except renal pelvis: Secondary | ICD-10-CM | POA: Diagnosis present

## 2019-03-26 DIAGNOSIS — I700 Atherosclerosis of aorta: Secondary | ICD-10-CM | POA: Insufficient documentation

## 2019-03-26 DIAGNOSIS — J439 Emphysema, unspecified: Secondary | ICD-10-CM | POA: Diagnosis not present

## 2019-03-26 DIAGNOSIS — I82412 Acute embolism and thrombosis of left femoral vein: Secondary | ICD-10-CM

## 2019-03-26 MED ORDER — HEPARIN SOD (PORK) LOCK FLUSH 100 UNIT/ML IV SOLN
INTRAVENOUS | Status: AC
  Filled 2019-03-26: qty 5

## 2019-03-26 MED ORDER — HEPARIN SOD (PORK) LOCK FLUSH 100 UNIT/ML IV SOLN
500.0000 [IU] | Freq: Once | INTRAVENOUS | Status: AC
  Administered 2019-03-26: 500 [IU] via INTRAVENOUS

## 2019-03-26 MED ORDER — SODIUM CHLORIDE (PF) 0.9 % IJ SOLN
INTRAMUSCULAR | Status: AC
  Filled 2019-03-26: qty 50

## 2019-03-26 MED ORDER — RIVAROXABAN (XARELTO) VTE STARTER PACK (15 & 20 MG): ORAL_TABLET | ORAL | 0 refills | Status: DC

## 2019-03-26 MED ORDER — IOHEXOL 300 MG/ML  SOLN
100.0000 mL | Freq: Once | INTRAMUSCULAR | Status: AC | PRN
  Administered 2019-03-26: 100 mL via INTRAVENOUS

## 2019-03-26 NOTE — Telephone Encounter (Signed)
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 I have sent a prescription for a xarelto starter pack to her pharmacy. She expressed understanding and I will see her in the clinic for a more detailed discussion regarding her findings at that time.

## 2019-03-31 NOTE — Progress Notes (Signed)
Melanie Best OFFICE PROGRESS NOTE  Melanie Low, MD [place] [address] [place] [address]  DIAGNOSIS:  1) Metastatic renal cell carcinoma, clear cell type, WHO nuclear grade 3 initially diagnosed in September 2010 and recently presented with bilateral pulmonary nodules. 2) history of renal cell carcinoma status post right nephrectomy in September 2010.  PRIOR THERAPY:  1)Stereotactic radiotherapy to pulmonary nodules based on the metabolic activity with no tissue diagnosis. This was performed under the care of Dr. Pablo Ledger. 2)first line treatment with immunotherapy with Nivolumab 3 MG/KG and Ipilumumab 1 mg/Kg every 3 weeks for 4 cycles followed by maintenance Nivolumab every 3 weeks. First dose 12/27/2016. Status post 3cycles.Stopped due to severe itching. 3)Nivolumab480 mg IV every 4 weeksas a single agentstartingon 05/03/2017.Status post2cycles.Discontinued secondary to disease progression.  CURRENT THERAPY: Cabometyx 40 mg daily.First dose started on 07/04/2017. Status post 60months of treatment  INTERVAL HISTORY: Melanie Best 74 y.o. female returns to the clinic today for a follow-up visit. The patient is feeling fine today with no concerning complaints. Her lack of appetite is stable per patient report. She is currently on Remeron 7.5 mg p.o. nightly. She denied having any current chest pain, shortness of breath, cough or hemoptysis. She is on 2 L nasal cannula of supplemental oxygen due to her COPD. She denied having any fever or chills. She has no recent weight loss or night sweats. She has no nausea, vomiting, diarrhea or constipation. She denies any back pain. She denies any hematuria or changes in her urinary patterns. Routine CT scan noted an incidental DVT. She is currently on Xarelto and she is tolerating it well without any signs/symptoms of bleeding. She recently had a restaging CT scan performed.She is here today for evaluation  and repeat blood work.  MEDICAL HISTORY: Past Medical History:  Diagnosis Date  . Anemia   . Anxiety   . Arthritis    R leg  . Cancer ([place])    kidney  . Chronic fatigue 12/13/2016  . CKD (chronic kidney disease), stage II    removed R kidney- 2010- for Cancer, followed by Dr. Diona Fanti  . COPD (chronic obstructive pulmonary disease) ([facility])    uses O2-2 liters , continuously , followed by Dr. Chase Caller  . Cough 08-02-2015  . Diabetes mellitus, type 2 ([place])   . DM (diabetes mellitus) ([name]) 01/27/2012  . DVT (deep venous thrombosis), left    2011- treated /w coumadin   . Dyslipidemia   . Dysrhythmia   . Esophageal reflux   . GERD (gastroesophageal reflux disease) 01/27/2012  . Glaucoma   . Goals of care, counseling/discussion 12/13/2016  . Hemoptysis 08-02-15  . History of radiation therapy 01/09/12,01/11/12,01/15/12,01/17/12,& 01/19/12   rul lung,50Gy/44fx  . HTN (hypertension) 01/27/2012  . Hyperlipidemia 01/27/2012  . Hypertension    sees Dr. Deforest Hoyles for PCP, denies ever having stress or ECHO, ?when & where she had  last  EKG  . Leukocytosis   . Lung cancer ([place])    NSCLC  . Osteopenia   . Renal cell carcinoma of right kidney ([place]) 12/13/2016  . Shortness of breath   . Syncope   . Tremor   . Urinary incontinence   . Vitamin D deficiency     ALLERGIES:  has No Known Allergies.  MEDICATIONS:  Current Outpatient Medications  Medication Sig Dispense Refill  . brinzolamide (AZOPT) 1 % ophthalmic suspension Place 1 drop into both eyes 3 (three) times daily. Reported on 08/12/2015    .  CABOMETYX 40 MG tablet TAKE 1 TABLET (40MG ) BY MOUTH ONCE DAILY. TAKE ON AN EMPTY STOMACH 1 HOUR BEFORE OR 2 HOURS AFTER MEALS 30 tablet 1  . CALCIUM PO Take by mouth daily.    . clonazePAM (KLONOPIN) 1 MG tablet Take 1 mg by mouth 2 (two) times daily as needed. For anxiety    . diltiazem (CARDIZEM CD) 180 MG 24 hr capsule Take 180 mg by mouth daily.  0  . ergocalciferol (VITAMIN D2) 50000 UNITS  capsule Take 50,000 Units by mouth once a week. Take on Monday    . furosemide (LASIX) 20 MG tablet Take 20 mg by mouth daily as needed. For fluid retention    . hydrocortisone cream 1 % Apply 1 application topically 2 (two) times daily. 30 g 0  . ipratropium-albuterol (DUONEB) 0.5-2.5 (3) MG/3ML SOLN Take 3 mLs by nebulization 4 (four) times daily as needed. 360 mL 4  . JANUVIA 50 MG tablet Take 50 mg by mouth daily.     Marland Kitchen lidocaine-prilocaine (EMLA) cream Apply 1 application topically as needed.    . lovastatin (MEVACOR) 20 MG tablet Take 1 tablet by mouth daily.    Marland Kitchen LUMIGAN 0.01 % SOLN 1 drop daily.    . magic mouthwash w/lidocaine SOLN Take 5 mLs by mouth 4 (four) times daily as needed for mouth pain. Swish and spit before meals and at bedtime. 480 mL 0  . methocarbamol (ROBAXIN) 750 MG tablet Take 750 mg by mouth 2 (two) times daily as needed.  0  . mirtazapine (REMERON) 7.5 MG tablet Take 1 tablet (7.5 mg total) by mouth daily. 30 tablet 1  . mupirocin ointment (BACTROBAN) 2 % Place 1 application into the nose 2 (two) times daily. 30 g 1  . Nebulizers (VIOS AEROSOL DELIVERY SYSTEM) MISC USE UTD BY MD WITH NEBULIZER SOLUTION    . OXYGEN Inhale 2 L into the lungs continuous.    . pantoprazole (PROTONIX) 40 MG tablet Take 1 tablet by mouth daily.    . Potassium Chloride ER 20 MEQ TBCR Take 1 tablet by mouth daily.    . predniSONE (DELTASONE) 5 MG tablet Take 4 mg by mouth daily with breakfast.     . promethazine (PHENERGAN) 25 MG tablet take 1 tablet by mouth every 6 hours if needed for nausea  0  . Rivaroxaban 15 & 20 MG TBPK Follow package directions: Take one 15mg  tablet by mouth twice a day. On day 22, switch to one 20mg  tablet once a day. Take with food. 51 each 0   No current facility-administered medications for this visit.   Facility-Administered Medications Ordered in Other Visits  Medication Dose Route Frequency Provider Last Rate Last Admin  . sodium chloride flush (NS) 0.9 %  injection 10 mL  10 mL Intravenous PRN Curt Bears, MD      . sodium chloride flush (NS) 0.9 % injection 10 mL  10 mL Intravenous PRN Curt Bears, MD   10 mL at 12/31/18 1348    SURGICAL HISTORY:  Past Surgical History:  Procedure Laterality Date  . BACK SURGERY     for pinched nerve, 2011, here at [facility]  . EYE SURGERY     cataracts removed, ?IOL  . FLEXIBLE BRONCHOSCOPY  11/29/2011  . IR FLUORO GUIDE PORT INSERTION RIGHT  01/30/2017  . IR US GUIDE VASC ACCESS RIGHT  01/30/2017  . removed blood clot     left upper abd quad   . right kidney removed    .  right nephrectomy      REVIEW OF SYSTEMS:   Review of Systems  Constitutional: Negative for appetite change, chills, fatigue, fever and unexpected weight change.  HENT:   Negative for mouth sores, nosebleeds, sore throat and trouble swallowing.   Eyes: Negative for eye problems and icterus.  Respiratory: Negative for cough, hemoptysis, shortness of breath and wheezing.   Cardiovascular: Negative for chest pain and leg swelling.  Gastrointestinal: Negative for abdominal pain, constipation, diarrhea, nausea and vomiting.  Genitourinary: Negative for bladder incontinence, difficulty urinating, dysuria, frequency and hematuria.   Musculoskeletal: Negative for back pain, gait problem, neck pain and neck stiffness.  Skin: Negative for itching and rash.  Neurological: Negative for dizziness, extremity weakness, gait problem, headaches, light-headedness and seizures.  Hematological: Negative for adenopathy. Does not bruise/bleed easily.  Psychiatric/Behavioral: Negative for confusion, depression and sleep disturbance. The patient is not nervous/anxious.     PHYSICAL EXAMINATION:  There were no vitals taken for this visit.  ECOG PERFORMANCE STATUS: 1 - Symptomatic but completely ambulatory  Physical Exam  Head: Normocephalic and atraumatic.  Mouth/Throat: Oropharynx is clear and moist. No oropharyngeal exudate.  Eyes:  Conjunctivae are normal. Right eye exhibits no discharge. Left eye exhibits no discharge. No scleral icterus.  Neck: Normal range of motion. Neck supple.  Cardiovascular: Normal rate, regular rhythm, normal heart sounds and intact distal pulses.  Pulmonary/Chest: Effort normal and breath sounds normal. No respiratory distress. No wheezes. No rales.  Abdominal: Soft. Bowel sounds are normal. Exhibits no distension and no mass. There is no tenderness.  Musculoskeletal: Very mild lower extremity edema L>R. Normal range of motion.  Lymphadenopathy:  No cervical adenopathy.  Neurological: Essential tremor/head titubation noted on exam.Alert and oriented to person, place, and time. Exhibits normal muscle tone. Gait normal. Coordination normal.  Skin: Skin is warm and dry. No rash noted. Not diaphoretic. No erythema. No pallor.  Psychiatric: Mood, memory and judgment normal.   LABORATORY DATA: Lab Results  Component Value Date   WBC 5.1 02/18/2019   HGB 8.6 (L) 02/18/2019   HCT 28.5 (L) 02/18/2019   MCV 123.9 (H) 02/18/2019   PLT 151 02/18/2019      Chemistry      Component Value Date/Time   NA 147 (H) 02/18/2019 1430   NA 141 03/08/2017 1258   K 3.3 (L) 02/18/2019 1430   K 3.7 03/08/2017 1258   CL 105 02/18/2019 1430   CO2 27 02/18/2019 1430   CO2 23 03/08/2017 1258   BUN 22 02/18/2019 1430   BUN 24.1 03/08/2017 1258   CREATININE 1.13 (H) 02/18/2019 1430   CREATININE 1.4 (H) 03/08/2017 1258      Component Value Date/Time   CALCIUM 8.8 (L) 02/18/2019 1430   CALCIUM 8.8 03/08/2017 1258   ALKPHOS 66 02/18/2019 1430   ALKPHOS 65 03/08/2017 1258   AST 19 02/18/2019 1430   AST 8 03/08/2017 1258   ALT 29 02/18/2019 1430   ALT 20 03/08/2017 1258   BILITOT 0.3 02/18/2019 1430   BILITOT 0.40 03/08/2017 1258       RADIOGRAPHIC STUDIES:  CT Abdomen Pelvis W Wo Contrast  Result Date: 03/26/2019 CLINICAL DATA:  History of bilateral renal cell carcinoma, status post right  nephrectomy. Lung mets. EXAM: CT CHEST WITH CONTRAST CT ABDOMEN AND PELVIS WITH AND WITHOUT CONTRAST TECHNIQUE: Multidetector CT imaging of the chest was performed during intravenous contrast administration. Multidetector CT imaging of the abdomen and pelvis was performed following the standard protocol before and during  bolus administration of intravenous contrast. CONTRAST:  127mL OMNIPAQUE IOHEXOL 300 MG/ML  SOLN COMPARISON:  11/22/2018 FINDINGS: CT CHEST FINDINGS Cardiovascular: The heart size is normal. No substantial pericardial effusion. Coronary artery calcification is evident. Atherosclerotic calcification is noted in the wall of the thoracic aorta. Right Port-A-Cath tip is at the SVC/RA junction. Mediastinum/Nodes: No mediastinal lymphadenopathy. There is no hilar lymphadenopathy. The esophagus has normal imaging features. There is no axillary lymphadenopathy. Lungs/Pleura: Centrilobular and paraseptal emphysema evident. Stable bandlike architectural distortion/scarring in the right suprahilar region. Tiny right middle lobe nodule identified previously is unchanged. 3 mm posterior right lower lobe nodule (121/8) is similar. Previously measured 1.3 cm left lower lobe nodule (88/8) is stable as is the nodularity tracking centrally towards the hilum and the adjacent ill-defined masslike consolidative opacity/scarring with involvement of the major fissure. 3 mm right upper lobe nodule (60/8) is new in the interval. Musculoskeletal: No worrisome lytic or sclerotic osseous abnormality. CT ABDOMEN AND PELVIS FINDINGS Hepatobiliary: Subcapsular hyperenhancement along the gallbladder fossa and inferior right liver compatible with transient hepatic attenuation difference without underlying etiology evident by CT. Previously identified heterogeneously enhancing lesion in the medial segment left liver not well demonstrated today. Gallbladder is distended with chronic gallbladder wall irregularity in layering dependent  sludge and/or stones. No intrahepatic or extrahepatic biliary dilation. Pancreas: No focal mass lesion. No dilatation of the main duct. No intraparenchymal cyst. No peripancreatic edema. Spleen: No splenomegaly. No focal mass lesion. Adrenals/Urinary Tract: No adrenal nodule or mass. Right kidney surgically absent. Heterogeneously enhancing mass in the lower pole right kidney is similar to prior measuring 5.9 x 6.2 x 4.7 cm today compared to 5.8 x 6.2 by 4.7 cm previously. Left renal vein invasion is similar to prior. No left hydroureter. The urinary bladder appears normal for the degree of distention. Stomach/Bowel: Stomach is unremarkable. No gastric wall thickening. No evidence of outlet obstruction. Duodenum is normally positioned as is the ligament of Treitz. The terminal ileum is normal. The appendix is not visualized, but there is no edema or inflammation in the region of the cecum. No gross colonic mass. No colonic wall thickening. Vascular/Lymphatic: There is abdominal aortic atherosclerosis without aneurysm. There is no gastrohepatic or hepatoduodenal ligament lymphadenopathy. No retroperitoneal or mesenteric lymphadenopathy. No pelvic sidewall lymphadenopathy. Filling defect noted in the left common femoral and deep femoral veins (see image 121/series 11) consistent with DVT. Reproductive: The uterus is unremarkable.  There is no adnexal mass. Other: No intraperitoneal free fluid. Musculoskeletal: No worrisome lytic or sclerotic osseous abnormality. IMPRESSION: 1. Interval development of DVT in the left common and deep femoral veins. 2. No substantial change in the bulky left renal neoplasm with left renal vein invasion. 3. The area of heterogeneous enhancement seen previously in the medial segment left liver is not well demonstrated on today's study. Continued attention to this region on follow-up recommended. 4. Right upper lobe and left lower lobe nodularity and scarring is similar to prior. 5. 3 mm  right upper lobe pulmonary nodule is new since prior. Attention on follow-up recommended. 6. The tiny posterior right lower lobe pulmonary nodule identified as new on the prior study is unchanged in the interval. 7.  Aortic Atherosclerois (ICD10-170.0) 8.  Emphysema. (GBT51-V61.9) Critical Value/emergent results were called by telephone at the time of interpretation on 03/26/2019 at 4:07 pm to [facility] , who verbally acknowledged these results. Electronically Signed   By: Misty Stanley M.D.   On: 03/26/2019 16:07   CT Chest W Contrast  Result Date: 03/26/2019 CLINICAL DATA:  History of bilateral renal cell carcinoma, status post right nephrectomy. Lung mets. EXAM: CT CHEST WITH CONTRAST CT ABDOMEN AND PELVIS WITH AND WITHOUT CONTRAST TECHNIQUE: Multidetector CT imaging of the chest was performed during intravenous contrast administration. Multidetector CT imaging of the abdomen and pelvis was performed following the standard protocol before and during bolus administration of intravenous contrast. CONTRAST:  146mL OMNIPAQUE IOHEXOL 300 MG/ML  SOLN COMPARISON:  11/22/2018 FINDINGS: CT CHEST FINDINGS Cardiovascular: The heart size is normal. No substantial pericardial effusion. Coronary artery calcification is evident. Atherosclerotic calcification is noted in the wall of the thoracic aorta. Right Port-A-Cath tip is at the SVC/RA junction. Mediastinum/Nodes: No mediastinal lymphadenopathy. There is no hilar lymphadenopathy. The esophagus has normal imaging features. There is no axillary lymphadenopathy. Lungs/Pleura: Centrilobular and paraseptal emphysema evident. Stable bandlike architectural distortion/scarring in the right suprahilar region. Tiny right middle lobe nodule identified previously is unchanged. 3 mm posterior right lower lobe nodule (121/8) is similar. Previously measured 1.3 cm left lower lobe nodule (88/8) is stable as is the nodularity tracking centrally towards the hilum and  the adjacent ill-defined masslike consolidative opacity/scarring with involvement of the major fissure. 3 mm right upper lobe nodule (60/8) is new in the interval. Musculoskeletal: No worrisome lytic or sclerotic osseous abnormality. CT ABDOMEN AND PELVIS FINDINGS Hepatobiliary: Subcapsular hyperenhancement along the gallbladder fossa and inferior right liver compatible with transient hepatic attenuation difference without underlying etiology evident by CT. Previously identified heterogeneously enhancing lesion in the medial segment left liver not well demonstrated today. Gallbladder is distended with chronic gallbladder wall irregularity in layering dependent sludge and/or stones. No intrahepatic or extrahepatic biliary dilation. Pancreas: No focal mass lesion. No dilatation of the main duct. No intraparenchymal cyst. No peripancreatic edema. Spleen: No splenomegaly. No focal mass lesion. Adrenals/Urinary Tract: No adrenal nodule or mass. Right kidney surgically absent. Heterogeneously enhancing mass in the lower pole right kidney is similar to prior measuring 5.9 x 6.2 x 4.7 cm today compared to 5.8 x 6.2 by 4.7 cm previously. Left renal vein invasion is similar to prior. No left hydroureter. The urinary bladder appears normal for the degree of distention. Stomach/Bowel: Stomach is unremarkable. No gastric wall thickening. No evidence of outlet obstruction. Duodenum is normally positioned as is the ligament of Treitz. The terminal ileum is normal. The appendix is not visualized, but there is no edema or inflammation in the region of the cecum. No gross colonic mass. No colonic wall thickening. Vascular/Lymphatic: There is abdominal aortic atherosclerosis without aneurysm. There is no gastrohepatic or hepatoduodenal ligament lymphadenopathy. No retroperitoneal or mesenteric lymphadenopathy. No pelvic sidewall lymphadenopathy. Filling defect noted in the left common femoral and deep femoral veins (see image  121/series 11) consistent with DVT. Reproductive: The uterus is unremarkable.  There is no adnexal mass. Other: No intraperitoneal free fluid. Musculoskeletal: No worrisome lytic or sclerotic osseous abnormality. IMPRESSION: 1. Interval development of DVT in the left common and deep femoral veins. 2. No substantial change in the bulky left renal neoplasm with left renal vein invasion. 3. The area of heterogeneous enhancement seen previously in the medial segment left liver is not well demonstrated on today's study. Continued attention to this region on follow-up recommended. 4. Right upper lobe and left lower lobe nodularity and scarring is similar to prior. 5. 3 mm right upper lobe pulmonary nodule is new since prior. Attention on follow-up recommended. 6. The tiny posterior right lower lobe pulmonary nodule identified as new on the prior  study is unchanged in the interval. 7.  Aortic Atherosclerois (ICD10-170.0) 8.  Emphysema. ([id number]) Critical Value/emergent results were called by telephone at the time of interpretation on 03/26/2019 at 4:07 pm to [facility] , who verbally acknowledged these results. Electronically Signed   By: Misty Stanley M.D.   On: 03/26/2019 16:07     ASSESSMENT/PLAN:  This is a very pleasant 74 year old African-American female diagnosed with metastatic renal cell carcinoma that was initially diagnosed 2010. CT-guided core biopsy of the bilateral pulmonary nodules was consistent with metastatic renal cell carcinoma. She was treated with stereotactic radiotherapy to the hypermetabolic nodules without tissue diagnosis in the past.  The patient was treated with a combination of ipilimumab and nivolumab for 3 cycles but ipilimumab was discontinued secondary to significant itching that was intolerable for the patient. She was treated with 2 more cycles of single agent nivolumab which was discontinued secondary to disease progression. She is currently undergoing  treatment with oral Cabometyx 40 mg p.o. daily. Status post 32months of treatment.The patient attributes her insomnia and lack of appetite to Cabometyx although she states her lack of appetite is improving/stable at this time.   The patient recently had a restaging CT scan performed. Dr. Julien Nordmann personally and independently reviewed the scan and discussed the results with the patient. The scan showed no evidence of disease progression. Dr. Julien Nordmann recommends that the patient continue on the same treatment at the same dose. The scan noted a small 3 mm new right upper lobe nodule which we will monitor on routine imaging.   We will see her back for a follow up visit in 6 weeks for evaluation and repeat blood work  She will continue on 7.5 mg of Remeron for her insomnia and decreased appetite. She has not lost weight since her last appointment.   The patient will continue to take xarelto for her recent DVT. Discussed to avoid NSAIDs and aspirin while taking blood thinner.   The patient was advised to call immediately if she has any concerning symptoms in the interval. The patient voices understanding of current disease status and treatment options and is in agreement with the current care plan. All questions were answered. The patient knows to call the clinic with any problems, questions or concerns. We can certainly see the patient much sooner if necessary.   No orders of the defined types were placed in this encounter.    Sharry Beining L Alynna Hargrove, PA-C 03/31/19  ADDENDUM: Hematology/Oncology Attending: I had a face-to-face encounter with the patient today.  I recommended her care plan.  This is a very pleasant 74 years old African-American female with history of metastatic renal cell carcinoma and she is currently on treatment with Cabometyx 40 mg p.o. daily.  The patient has been tolerating this treatment well with no concerning adverse effects. She had repeat CT scan of the chest, abdomen  pelvis performed recently.  I personally and independently reviewed the scans and discussed the results with the patient today. Her scan showed no concerning findings for disease progression but there was a tiny 3 mm nodule in the right upper lobe that need monitoring on the upcoming imaging studies. I recommended for the patient to continue her current treatment with Cabometyx with the same dose. She will come back for follow-up visit in 6 weeks for evaluation and repeat blood work. The patient was advised to call immediately if she has any other concerning symptoms in the interval.  Disclaimer: This note was dictated with voice recognition  software. Similar sounding words can inadvertently be transcribed and may be missed upon review. Eilleen Kempf, MD 04/01/19

## 2019-04-01 ENCOUNTER — Encounter: Payer: Self-pay | Admitting: Physician Assistant

## 2019-04-01 ENCOUNTER — Other Ambulatory Visit: Payer: Self-pay

## 2019-04-01 ENCOUNTER — Inpatient Hospital Stay: Payer: Medicare Other

## 2019-04-01 ENCOUNTER — Inpatient Hospital Stay (HOSPITAL_BASED_OUTPATIENT_CLINIC_OR_DEPARTMENT_OTHER): Payer: Medicare Other | Admitting: Physician Assistant

## 2019-04-01 VITALS — BP 126/76 | HR 103 | Temp 98.5°F | Resp 18 | Ht 65.0 in | Wt 122.4 lb

## 2019-04-01 DIAGNOSIS — C641 Malignant neoplasm of right kidney, except renal pelvis: Secondary | ICD-10-CM

## 2019-04-01 DIAGNOSIS — C649 Malignant neoplasm of unspecified kidney, except renal pelvis: Secondary | ICD-10-CM

## 2019-04-01 DIAGNOSIS — C3411 Malignant neoplasm of upper lobe, right bronchus or lung: Secondary | ICD-10-CM

## 2019-04-01 LAB — CBC WITH DIFFERENTIAL (CANCER CENTER ONLY)
Abs Immature Granulocytes: 0.08 10*3/uL — ABNORMAL HIGH (ref 0.00–0.07)
Basophils Absolute: 0 10*3/uL (ref 0.0–0.1)
Basophils Relative: 0 %
Eosinophils Absolute: 0 10*3/uL (ref 0.0–0.5)
Eosinophils Relative: 0 %
HCT: 31.5 % — ABNORMAL LOW (ref 36.0–46.0)
Hemoglobin: 9.4 g/dL — ABNORMAL LOW (ref 12.0–15.0)
Immature Granulocytes: 1 %
Lymphocytes Relative: 22 %
Lymphs Abs: 2 10*3/uL (ref 0.7–4.0)
MCH: 38.7 pg — ABNORMAL HIGH (ref 26.0–34.0)
MCHC: 29.8 g/dL — ABNORMAL LOW (ref 30.0–36.0)
MCV: 129.6 fL — ABNORMAL HIGH (ref 80.0–100.0)
Monocytes Absolute: 0.3 10*3/uL (ref 0.1–1.0)
Monocytes Relative: 4 %
Neutro Abs: 6.6 10*3/uL (ref 1.7–7.7)
Neutrophils Relative %: 73 %
Platelet Count: 210 10*3/uL (ref 150–400)
RBC: 2.43 MIL/uL — ABNORMAL LOW (ref 3.87–5.11)
RDW: 17.7 % — ABNORMAL HIGH (ref 11.5–15.5)
WBC Count: 9.1 10*3/uL (ref 4.0–10.5)
nRBC: 2.5 % — ABNORMAL HIGH (ref 0.0–0.2)

## 2019-04-01 LAB — CMP (CANCER CENTER ONLY)
ALT: 33 U/L (ref 0–44)
AST: 24 U/L (ref 15–41)
Albumin: 3.6 g/dL (ref 3.5–5.0)
Alkaline Phosphatase: 57 U/L (ref 38–126)
Anion gap: 14 (ref 5–15)
BUN: 19 mg/dL (ref 8–23)
CO2: 22 mmol/L (ref 22–32)
Calcium: 8 mg/dL — ABNORMAL LOW (ref 8.9–10.3)
Chloride: 107 mmol/L (ref 98–111)
Creatinine: 1.21 mg/dL — ABNORMAL HIGH (ref 0.44–1.00)
GFR, Est AFR Am: 52 mL/min — ABNORMAL LOW (ref >60)
GFR, Estimated: 45 mL/min — ABNORMAL LOW (ref >60)
Glucose, Bld: 153 mg/dL — ABNORMAL HIGH (ref 70–99)
Potassium: 3.7 mmol/L (ref 3.5–5.1)
Sodium: 143 mmol/L (ref 135–145)
Total Bilirubin: 0.3 mg/dL (ref 0.3–1.2)
Total Protein: 6 g/dL — ABNORMAL LOW (ref 6.5–8.1)

## 2019-04-02 ENCOUNTER — Telehealth: Payer: Self-pay | Admitting: Internal Medicine

## 2019-04-02 NOTE — Telephone Encounter (Signed)
Scheduled per los. Called and spoke with patient. Confirmed appt 

## 2019-04-03 ENCOUNTER — Other Ambulatory Visit: Payer: Self-pay | Admitting: *Deleted

## 2019-04-03 DIAGNOSIS — C641 Malignant neoplasm of right kidney, except renal pelvis: Secondary | ICD-10-CM

## 2019-04-03 NOTE — Progress Notes (Signed)
Order dme for patient

## 2019-04-04 ENCOUNTER — Other Ambulatory Visit: Payer: Self-pay | Admitting: Internal Medicine

## 2019-04-04 DIAGNOSIS — Z1231 Encounter for screening mammogram for malignant neoplasm of breast: Secondary | ICD-10-CM

## 2019-04-17 MED FILL — CABOMETYX 40 MG TABLET: 40 | 30 days supply | Qty: 30 | Fill #1

## 2019-04-21 ENCOUNTER — Other Ambulatory Visit: Payer: Self-pay | Admitting: Physician Assistant

## 2019-04-21 ENCOUNTER — Other Ambulatory Visit: Payer: Self-pay | Admitting: Medical Oncology

## 2019-04-21 ENCOUNTER — Telehealth: Payer: Self-pay | Admitting: Medical Oncology

## 2019-04-21 MED ORDER — RIVAROXABAN 20 MG PO TABS: 20.0000 mg | ORAL_TABLET | Freq: Every day | ORAL | 3 refills | Status: DC

## 2019-04-21 NOTE — Telephone Encounter (Signed)
Pt requests refill for xarelto.

## 2019-04-22 ENCOUNTER — Other Ambulatory Visit: Payer: Self-pay | Admitting: Physician Assistant

## 2019-04-22 DIAGNOSIS — C649 Malignant neoplasm of unspecified kidney, except renal pelvis: Secondary | ICD-10-CM

## 2019-04-22 MED ORDER — MIRTAZAPINE 7.5 MG PO TABS: 7.5000 mg | ORAL_TABLET | Freq: Every day | ORAL | 1 refills | Status: DC

## 2019-05-06 ENCOUNTER — Inpatient Hospital Stay: Payer: Medicare Other | Attending: Internal Medicine

## 2019-05-06 DIAGNOSIS — C641 Malignant neoplasm of right kidney, except renal pelvis: Secondary | ICD-10-CM | POA: Insufficient documentation

## 2019-05-06 DIAGNOSIS — R630 Anorexia: Secondary | ICD-10-CM | POA: Insufficient documentation

## 2019-05-06 DIAGNOSIS — R634 Abnormal weight loss: Secondary | ICD-10-CM | POA: Insufficient documentation

## 2019-05-06 DIAGNOSIS — C3432 Malignant neoplasm of lower lobe, left bronchus or lung: Secondary | ICD-10-CM | POA: Insufficient documentation

## 2019-05-06 DIAGNOSIS — R5383 Other fatigue: Secondary | ICD-10-CM | POA: Insufficient documentation

## 2019-05-12 ENCOUNTER — Other Ambulatory Visit: Payer: Self-pay | Admitting: Physician Assistant

## 2019-05-12 DIAGNOSIS — C641 Malignant neoplasm of right kidney, except renal pelvis: Secondary | ICD-10-CM

## 2019-05-13 ENCOUNTER — Inpatient Hospital Stay: Payer: Medicare Other

## 2019-05-13 ENCOUNTER — Inpatient Hospital Stay (HOSPITAL_BASED_OUTPATIENT_CLINIC_OR_DEPARTMENT_OTHER): Payer: Medicare Other | Admitting: Internal Medicine

## 2019-05-13 ENCOUNTER — Other Ambulatory Visit: Payer: Self-pay

## 2019-05-13 ENCOUNTER — Encounter: Payer: Self-pay | Admitting: Internal Medicine

## 2019-05-13 VITALS — BP 130/77 | HR 60 | Temp 98.5°F | Resp 18 | Ht 65.0 in | Wt 118.8 lb

## 2019-05-13 DIAGNOSIS — C649 Malignant neoplasm of unspecified kidney, except renal pelvis: Secondary | ICD-10-CM | POA: Diagnosis not present

## 2019-05-13 DIAGNOSIS — R5382 Chronic fatigue, unspecified: Secondary | ICD-10-CM

## 2019-05-13 DIAGNOSIS — I10 Essential (primary) hypertension: Secondary | ICD-10-CM

## 2019-05-13 DIAGNOSIS — C641 Malignant neoplasm of right kidney, except renal pelvis: Secondary | ICD-10-CM

## 2019-05-13 DIAGNOSIS — Z5111 Encounter for antineoplastic chemotherapy: Secondary | ICD-10-CM

## 2019-05-13 DIAGNOSIS — C3432 Malignant neoplasm of lower lobe, left bronchus or lung: Secondary | ICD-10-CM | POA: Diagnosis not present

## 2019-05-13 DIAGNOSIS — R634 Abnormal weight loss: Secondary | ICD-10-CM | POA: Diagnosis not present

## 2019-05-13 DIAGNOSIS — R630 Anorexia: Secondary | ICD-10-CM | POA: Diagnosis not present

## 2019-05-13 DIAGNOSIS — R5383 Other fatigue: Secondary | ICD-10-CM | POA: Diagnosis not present

## 2019-05-13 LAB — CMP (CANCER CENTER ONLY)
ALT: 27 U/L (ref 0–44)
AST: 25 U/L (ref 15–41)
Albumin: 3.7 g/dL (ref 3.5–5.0)
Alkaline Phosphatase: 56 U/L (ref 38–126)
Anion gap: 16 — ABNORMAL HIGH (ref 5–15)
BUN: 25 mg/dL — ABNORMAL HIGH (ref 8–23)
CO2: 27 mmol/L (ref 22–32)
Calcium: 9.8 mg/dL (ref 8.9–10.3)
Chloride: 101 mmol/L (ref 98–111)
Creatinine: 1.33 mg/dL — ABNORMAL HIGH (ref 0.44–1.00)
GFR, Est AFR Am: 46 mL/min — ABNORMAL LOW (ref >60)
GFR, Estimated: 40 mL/min — ABNORMAL LOW (ref >60)
Glucose, Bld: 122 mg/dL — ABNORMAL HIGH (ref 70–99)
Potassium: 3.9 mmol/L (ref 3.5–5.1)
Sodium: 144 mmol/L (ref 135–145)
Total Bilirubin: 0.5 mg/dL (ref 0.3–1.2)
Total Protein: 6.2 g/dL — ABNORMAL LOW (ref 6.5–8.1)

## 2019-05-13 LAB — CBC WITH DIFFERENTIAL (CANCER CENTER ONLY)
Abs Immature Granulocytes: 0.05 10*3/uL (ref 0.00–0.07)
Basophils Absolute: 0 10*3/uL (ref 0.0–0.1)
Basophils Relative: 0 %
Eosinophils Absolute: 0.1 10*3/uL (ref 0.0–0.5)
Eosinophils Relative: 1 %
HCT: 30.6 % — ABNORMAL LOW (ref 36.0–46.0)
Hemoglobin: 9.4 g/dL — ABNORMAL LOW (ref 12.0–15.0)
Immature Granulocytes: 1 %
Lymphocytes Relative: 50 %
Lymphs Abs: 4.3 10*3/uL — ABNORMAL HIGH (ref 0.7–4.0)
MCH: 38.2 pg — ABNORMAL HIGH (ref 26.0–34.0)
MCHC: 30.7 g/dL (ref 30.0–36.0)
MCV: 124.4 fL — ABNORMAL HIGH (ref 80.0–100.0)
Monocytes Absolute: 0.4 10*3/uL (ref 0.1–1.0)
Monocytes Relative: 4 %
Neutro Abs: 3.9 10*3/uL (ref 1.7–7.7)
Neutrophils Relative %: 44 %
Platelet Count: 161 10*3/uL (ref 150–400)
RBC: 2.46 MIL/uL — ABNORMAL LOW (ref 3.87–5.11)
RDW: 18.4 % — ABNORMAL HIGH (ref 11.5–15.5)
WBC Count: 8.7 10*3/uL (ref 4.0–10.5)
nRBC: 6.1 % — ABNORMAL HIGH (ref 0.0–0.2)

## 2019-05-13 MED ORDER — METHYLPREDNISOLONE 4 MG PO TBPK: ORAL_TABLET | ORAL | 0 refills | Status: DC

## 2019-05-13 NOTE — Progress Notes (Signed)
Nutrition Follow-up:  Patient with metastatic renal cancer.  Patient taking carbometyx.  Noted recent DVT.    Spoke with patient via phone for nutrition follow-up.  Patient reports fatigue and no appetite.  Denies nausea.  Patient has been trying to cook but gets tired easily.  Has been trying to eat good sources of protein (chicken, pinto beans, steak).  Has also been drinking boost plus shakes.    Medications: remeron, started medrol dospak  Labs: reviewed  Anthropometrics:   Weight decreased to 118 lb from 123 lb on 12/1   NUTRITION DIAGNOSIS: Inadequate oral intake    INTERVENTION:  Discussed ways for patient to add in extra calories and protein. Continue oral nutrition supplements. Patient has contact inforamtion   MONITORING, EVALUATION, GOAL: Patient will consume adequate calories and protein to prevent weight loss   NEXT VISIT: March 30th phone f/u  Melanie Best, [place], [place] Registered Dietitian [phone] (pager)

## 2019-05-13 NOTE — Progress Notes (Signed)
[place] [phone]   [phone]) [phone]  OFFICE PROGRESS NOTE  Melanie Low, MD [address] [place] [address]  DIAGNOSIS:  1) Metastatic renal cell carcinoma, clear cell type, WHO nuclear grade 3 initially diagnosed in September 2010 and recently presented with bilateral pulmonary nodules. 2) history of renal cell carcinoma status post right nephrectomy in September 2010.  PRIOR THERAPY: 1)Stereotactic radiotherapy to pulmonary nodules based on the metabolic activity with no tissue diagnosis. This was performed under the care of Dr. Pablo Ledger. 2)first line treatment with immunotherapy with Nivolumab 3 MG/KG and Ipilumumab 1 mg/Kg every 3 weeks for 4 cycles followed by maintenance Nivolumab every 3 weeks. First dose 12/27/2016. Status post 3cycles.Stopped due to severe itching. 3) Nivolumab480 mg IV every 4 weeksas a single agentstartingon 05/03/2017.Status post2cycles.  Discontinued secondary to disease progression.  CURRENT THERAPY: Cabometyx 40 mg daily.  First dose started on 07/04/2017.  Status post 22  months of treatment.  INTERVAL HISTORY: Melanie Best 75 y.o. female returns to the clinic today for follow-up visit.  The patient is feeling fine today except for fatigue and recent weight loss.  She also has lack of appetite.  She started on several medications to help her appetite including Remeron with no improvement.  I think part of her weight loss is related to her diabetic medications.  She has no current chest pain, shortness of breath, cough or hemoptysis.  She denied having any fever or chills.  She has no nausea, vomiting, diarrhea or constipation.  She has no headache or visual changes.  She continues to tolerate her treatment with Cabometyx fairly well.  She is here today for evaluation and repeat blood work.   MEDICAL HISTORY: Past Medical History:  Diagnosis Date  . Anemia   . Anxiety   .  Arthritis    R leg  . Cancer ([place])    kidney  . Chronic fatigue 12/13/2016  . CKD (chronic kidney disease), stage II    removed R kidney- 2010- for Cancer, followed by Dr. Diona Fanti  . COPD (chronic obstructive pulmonary disease) ([facility])    uses O2-2 liters , continuously , followed by Dr. Chase Caller  . Cough 08-02-2015  . Diabetes mellitus, type 2 ([facility])   . DM (diabetes mellitus) ([place]) 01/27/2012  . DVT (deep venous thrombosis), left    2011- treated /w coumadin   . Dyslipidemia   . Dysrhythmia   . Esophageal reflux   . GERD (gastroesophageal reflux disease) 01/27/2012  . Glaucoma   . Goals of care, counseling/discussion 12/13/2016  . Hemoptysis 08-02-15  . History of radiation therapy 01/09/12,01/11/12,01/15/12,01/17/12,& 01/19/12   rul lung,50Gy/40fx  . HTN (hypertension) 01/27/2012  . Hyperlipidemia 01/27/2012  . Hypertension    sees Dr. Deforest Hoyles for PCP, denies ever having stress or ECHO, ?when & where she had  last  EKG  . Leukocytosis   . Lung cancer ([place])    NSCLC  . Osteopenia   . Renal cell carcinoma of right kidney ([place]) 12/13/2016  . Shortness of breath   . Syncope   . Tremor   . Urinary incontinence   . Vitamin D deficiency     ALLERGIES:  has No Known Allergies.  MEDICATIONS:  Current Outpatient Medications  Medication Sig Dispense Refill  . alendronate (FOSAMAX) 70 MG tablet Take 70 mg by mouth once a week.    . brinzolamide (AZOPT) 1 % ophthalmic suspension Place 1 drop into both eyes  3 (three) times daily. Reported on 08/12/2015    . CABOMETYX 40 MG tablet TAKE 1 TABLET (40MG ) BY MOUTH ONCE DAILY. TAKE ON AN EMPTY STOMACH 1 HOUR BEFORE OR 2 HOURS AFTER MEALS 30 tablet 1  . CALCIUM PO Take by mouth daily.    . clonazePAM (KLONOPIN) 1 MG tablet Take 1 mg by mouth 2 (two) times daily as needed. For anxiety    . diltiazem (CARDIZEM CD) 180 MG 24 hr capsule Take 180 mg by mouth daily.  0  . ergocalciferol (VITAMIN D2) 50000 UNITS capsule Take 50,000 Units by mouth once a  week. Take on Monday    . esomeprazole (NEXIUM) 40 MG capsule Take 40 mg by mouth daily.    . furosemide (LASIX) 20 MG tablet Take 20 mg by mouth daily as needed. For fluid retention    . hydrocortisone cream 1 % Apply 1 application topically 2 (two) times daily. 30 g 0  . ipratropium-albuterol (DUONEB) 0.5-2.5 (3) MG/3ML SOLN Take 3 mLs by nebulization 4 (four) times daily as needed. 360 mL 4  . JANUVIA 50 MG tablet Take 50 mg by mouth daily.     Marland Kitchen lidocaine-prilocaine (EMLA) cream Apply 1 application topically as needed.    . lovastatin (MEVACOR) 20 MG tablet Take 1 tablet by mouth daily.    Marland Kitchen LUMIGAN 0.01 % SOLN 1 drop daily.    . mirtazapine (REMERON) 7.5 MG tablet Take 1 tablet (7.5 mg total) by mouth daily. 30 tablet 1  . mupirocin ointment (BACTROBAN) 2 % Place 1 application into the nose 2 (two) times daily. 30 g 1  . Nebulizers (VIOS AEROSOL DELIVERY SYSTEM) MISC USE UTD BY MD WITH NEBULIZER SOLUTION    . OXYGEN Inhale 2 L into the lungs continuous.    . Potassium Chloride ER 20 MEQ TBCR Take 1 tablet by mouth daily.    . predniSONE (DELTASONE) 5 MG tablet Take 4 mg by mouth daily with breakfast.     . rivaroxaban (XARELTO) 20 MG TABS tablet Take 1 tablet (20 mg total) by mouth daily with supper. 30 tablet 3  . magic mouthwash w/lidocaine SOLN Take 5 mLs by mouth 4 (four) times daily as needed for mouth pain. Swish and spit before meals and at bedtime. (Patient not taking: Reported on 05/13/2019) 480 mL 0  . methocarbamol (ROBAXIN) 750 MG tablet Take 750 mg by mouth 2 (two) times daily as needed.  0  . promethazine (PHENERGAN) 25 MG tablet take 1 tablet by mouth every 6 hours if needed for nausea  0   No current facility-administered medications for this visit.   Facility-Administered Medications Ordered in Other Visits  Medication Dose Route Frequency Provider Last Rate Last Admin  . sodium chloride flush (NS) 0.9 % injection 10 mL  10 mL Intravenous PRN Curt Bears, MD      .  sodium chloride flush (NS) 0.9 % injection 10 mL  10 mL Intravenous PRN Curt Bears, MD   10 mL at 12/31/18 1348    SURGICAL HISTORY:  Past Surgical History:  Procedure Laterality Date  . BACK SURGERY     for pinched nerve, 2011, here at [facility]  . EYE SURGERY     cataracts removed, ?IOL  . FLEXIBLE BRONCHOSCOPY  11/29/2011  . IR FLUORO GUIDE PORT INSERTION RIGHT  01/30/2017  . IR US GUIDE VASC ACCESS RIGHT  01/30/2017  . removed blood clot     left upper abd quad   . right  kidney removed    . right nephrectomy      REVIEW OF SYSTEMS:  A comprehensive review of systems was negative except for: Constitutional: positive for anorexia, fatigue and weight loss Musculoskeletal: positive for arthralgias   PHYSICAL EXAMINATION: General appearance: alert, cooperative, fatigued and no distress Head: Normocephalic, without obvious abnormality, atraumatic Neck: no adenopathy, no JVD, supple, symmetrical, trachea midline and thyroid not enlarged, symmetric, no tenderness/mass/nodules Lymph nodes: Cervical, supraclavicular, and axillary nodes normal. Resp: clear to auscultation bilaterally Back: symmetric, no curvature. ROM normal. No CVA tenderness. Cardio: regular rate and rhythm, S1, S2 normal, no murmur, click, rub or gallop GI: soft, non-tender; bowel sounds normal; no masses,  no organomegaly Extremities: extremities normal, atraumatic, no cyanosis or edema  ECOG PERFORMANCE STATUS: 1 - Symptomatic but completely ambulatory  Blood pressure 130/77, pulse 60, temperature 98.5 F (36.9 C), temperature source Temporal, resp. rate 18, height 5\' 5"  (1.651 m), weight 118 lb 12.8 oz (53.9 kg), SpO2 99 %.  LABORATORY DATA: Lab Results  Component Value Date   WBC 8.7 05/13/2019   HGB 9.4 (L) 05/13/2019   HCT 30.6 (L) 05/13/2019   MCV 124.4 (H) 05/13/2019   PLT 161 05/13/2019      Chemistry      Component Value Date/Time   NA 144 05/13/2019 1134   NA 141 03/08/2017 1258   K 3.9  05/13/2019 1134   K 3.7 03/08/2017 1258   CL 101 05/13/2019 1134   CO2 27 05/13/2019 1134   CO2 23 03/08/2017 1258   BUN 25 (H) 05/13/2019 1134   BUN 24.1 03/08/2017 1258   CREATININE 1.33 (H) 05/13/2019 1134   CREATININE 1.4 (H) 03/08/2017 1258      Component Value Date/Time   CALCIUM 9.8 05/13/2019 1134   CALCIUM 8.8 03/08/2017 1258   ALKPHOS 56 05/13/2019 1134   ALKPHOS 65 03/08/2017 1258   AST 25 05/13/2019 1134   AST 8 03/08/2017 1258   ALT 27 05/13/2019 1134   ALT 20 03/08/2017 1258   BILITOT 0.5 05/13/2019 1134   BILITOT 0.40 03/08/2017 1258       RADIOGRAPHIC STUDIES: No results found.  ASSESSMENT AND PLAN: This is a very pleasant 74 years old African-American female with metastatic renal cell carcinoma that was initially diagnosed in 2010 and now presenting with bilateral pulmonary nodules. She was treated with stereotactic radiotherapy to hypermetabolic nodules without tissue diagnosis in the past. Recent imaging studies showed hypermetabolic and enlarging bilateral pulmonary nodules. CT-guided core biopsy of right pulmonary nodule was consistent with metastatic renal cell carcinoma. The patient was treated with a combination of ipilimumab and nivolumab for 3 cycles but ipilimumab was discontinued secondary to significant itching that was intolerable for the patient.  She was treated with 2 more cycles of single agent nivolumab which was discontinued secondary to disease progression. She is currently undergoing treatment with oral Cabometyx 40 mg p.o. Daily.  Status post 22 months of treatment. The patient continues to tolerate this treatment well with no significant adverse effects. I recommended for her to continue her current treatment with Cabometyx with the same dose. I will see her back for follow-up visit in 6 weeks for evaluation with repeat blood work. For the lack of appetite and weight loss, I will start the patient on Medrol Dosepak.  She will continue her  current treatment with Remeron. The patient will have Port-A-Cath flush every 6 weeks. The patient was advised to call immediately if she has any concerning symptoms in  the interval. The patient voices understanding of current disease status and treatment options and is in agreement with the current care plan. All questions were answered. The patient knows to call the clinic with any problems, questions or concerns. We can certainly see the patient much sooner if necessary.  Disclaimer: This note was dictated with voice recognition software. Similar sounding words can inadvertently be transcribed and may not be corrected upon review.

## 2019-05-14 ENCOUNTER — Ambulatory Visit
Admission: RE | Admit: 2019-05-14 | Discharge: 2019-05-14 | Disposition: A | Discharge status: Home / Self Care | Payer: Medicare Other | Source: Ambulatory Visit | Attending: Internal Medicine | Admitting: Internal Medicine

## 2019-05-14 ENCOUNTER — Other Ambulatory Visit: Payer: Self-pay | Admitting: Internal Medicine

## 2019-05-14 ENCOUNTER — Telehealth: Payer: Self-pay | Admitting: Internal Medicine

## 2019-05-14 DIAGNOSIS — Z1231 Encounter for screening mammogram for malignant neoplasm of breast: Secondary | ICD-10-CM

## 2019-05-14 NOTE — Telephone Encounter (Signed)
Called and spoke with patient. Confirmed appt for 4/6

## 2019-05-15 ENCOUNTER — Ambulatory Visit: Payer: Medicare Other

## 2019-05-18 ENCOUNTER — Ambulatory Visit: Payer: Medicare Other | Attending: Internal Medicine

## 2019-05-18 DIAGNOSIS — Z23 Encounter for immunization: Secondary | ICD-10-CM

## 2019-05-18 NOTE — Progress Notes (Signed)
   Covid-19 Vaccination Clinic  Name:  Melanie Best    MRN: [id number] DOB: [birth year]  05/18/2019  [name] was observed post Covid-19 immunization for 15 minutes without incidence. She was provided with Vaccine Information Sheet and instruction to access the V-Safe system.   [name] was instructed to call 911 with any severe reactions post vaccine: Marland Kitchen Difficulty breathing  . Swelling of your face and throat  . A fast heartbeat  . A bad rash all over your body  . Dizziness and weakness    Immunizations Administered    Name Date Dose VIS Date Route   Pfizer COVID-19 Vaccine 05/18/2019  3:04 PM 0.3 mL 02/28/2019 Intramuscular   Manufacturer: [place]   Lot: [id number]   [place]: [id number]

## 2019-05-21 ENCOUNTER — Telehealth: Payer: Self-pay

## 2019-05-21 ENCOUNTER — Telehealth: Payer: Self-pay | Admitting: Medical Oncology

## 2019-05-21 NOTE — Telephone Encounter (Addendum)
Completed all but 2 days of medrol dose pack " It made me short winded".  Last dose 2/26.   Reports I cannot eat because there is no taste to food. She is drinking boost. Message sent to dieticians.  I asked her to weigh herself today and call me back . She weighs 115 lb today.

## 2019-05-21 NOTE — Telephone Encounter (Signed)
Nutrition Follow-up:  Received message from RN to contact patient due to taste alterations.    Spoke with patient via phone this pm.  Patient reports that food has no taste or taste like cardboard.  Has only eaten sugar smacks today with milk.  Reports that she is drinking 1 boost plus shake per day and can taste it.  Reports no bowel movement since Sunday 2/28.    Medications: reviewed  Labs: reviewed  Anthropometrics:   Weight today 115 lb per patient decreased from 118 lb last taken in clinic.    NUTRITION DIAGNOSIS: Inadequate oral intake   INTERVENTION:  Discussed strategies with patient to help with taste alterations.   Encouraged her to increase shake to BID if able for more calories and protein.  Encouraged patient to call clinic if does not have bowel movement for bowel regimen. Will send message to RN as well.   Patient has contact information     MONITORING, EVALUATION, GOAL: Patient will consume adequate calories and protein to prevent weight loss   NEXT VISIT: March 23rd phone  Levan Aloia B. Zenia Resides, [place], [place] Registered Dietitian [phone] (pager)

## 2019-05-21 NOTE — Telephone Encounter (Signed)
Instructed Melanie Best to take her Miralax ( she has the medication but has not been taking it) I also suggested she get senna s and take 1 tablet BID. I asked her to call back tomorrow  mid afternoon with an update.

## 2019-05-22 ENCOUNTER — Telehealth: Payer: Self-pay | Admitting: Medical Oncology

## 2019-05-22 MED FILL — CABOMETYX 40 MG TABLET: 40 | 30 days supply | Qty: 30 | Fill #0

## 2019-05-22 NOTE — Telephone Encounter (Signed)
No BM after taking miralax yesterday  I reminded pt to take sennakot s bid and if that does not work to take Magnesium citrate-start with 1/2 bottle then take rest if no Bm in 1-2 hours. Pt voiced understanding. She denies abdominal pain.

## 2019-06-10 ENCOUNTER — Inpatient Hospital Stay: Payer: Medicare Other | Attending: Internal Medicine

## 2019-06-10 NOTE — Progress Notes (Signed)
Nutrition Follow-up:  Patient with metastatic renal cancer.  Patient taking carbometyx.    Spoke with patient via phone for nutrition follow-up.  Patient reports that her appetite is better and taste has improved.  Reports so far today has eaten 2 hashbrowns and drank 2 boost shakes.  Yesterday ate some shrimp and can of soup, can't remember anything else she ate.    Reports bowel movement this am, normal.  No more constipation and not currently taking any medications for constipation.      Medications: reviewed  Labs: reviewed  Anthropometrics:   No new weight   NUTRITION DIAGNOSIS: Patient will consume adequate calories and protein to prevent weight loss   INTERVENTION:  Patient to continue drinking boost BID Encouraged adding snack between breakfast and lunch Patient has contact information    MONITORING, EVALUATION, GOAL: Patient will consume adequate calories and protein to prevent weight loss   NEXT VISIT: April 27 phone f/u  Derik Fults B. Zenia Resides, [place], [place] Registered Dietitian [phone] (pager)

## 2019-06-12 ENCOUNTER — Other Ambulatory Visit: Payer: Self-pay

## 2019-06-12 DIAGNOSIS — C649 Malignant neoplasm of unspecified kidney, except renal pelvis: Secondary | ICD-10-CM

## 2019-06-12 MED ORDER — MIRTAZAPINE 7.5 MG PO TABS: 7.5000 mg | ORAL_TABLET | Freq: Every day | ORAL | 1 refills | Status: AC

## 2019-06-12 MED ORDER — RIVAROXABAN 20 MG PO TABS: 20.0000 mg | ORAL_TABLET | Freq: Every day | ORAL | 3 refills | Status: DC

## 2019-06-17 ENCOUNTER — Ambulatory Visit: Payer: Medicare Other | Attending: Internal Medicine

## 2019-06-17 ENCOUNTER — Telehealth: Payer: Self-pay | Admitting: Internal Medicine

## 2019-06-17 ENCOUNTER — Inpatient Hospital Stay: Payer: Medicare Other

## 2019-06-17 DIAGNOSIS — Z23 Encounter for immunization: Secondary | ICD-10-CM

## 2019-06-17 NOTE — Progress Notes (Signed)
   Covid-19 Vaccination Clinic  Name:  MCKENZYE CUTRIGHT    MRN: [id number] DOB: Feb 07, 1946  06/17/2019  Ms. Disbro was observed post Covid-19 immunization for 15 minutes without incident. She was provided with Vaccine Information Sheet and instruction to access the V-Safe system.   Ms. Rensch was instructed to call 911 with any severe reactions post vaccine: Marland Kitchen Difficulty breathing  . Swelling of face and throat  . A fast heartbeat  . A bad rash all over body  . Dizziness and weakness   Immunizations Administered    Name Date Dose VIS Date Route   Pfizer COVID-19 Vaccine 06/17/2019  2:49 PM 0.3 mL 02/28/2019 Intramuscular   Manufacturer: [place]   Lot: [id number]   [place]: [id number]

## 2019-06-17 NOTE — Telephone Encounter (Signed)
Patient wanted to r/s 3/30 appt. Moved to 4/1. Called and left msg

## 2019-06-18 ENCOUNTER — Telehealth: Payer: Self-pay | Admitting: Internal Medicine

## 2019-06-18 NOTE — Telephone Encounter (Signed)
Called pt per 3/31 sch message- unable to reach pt - left message with appt date and time

## 2019-06-19 ENCOUNTER — Inpatient Hospital Stay: Payer: Medicare Other

## 2019-06-20 ENCOUNTER — Telehealth: Payer: Self-pay | Admitting: *Deleted

## 2019-06-20 NOTE — Telephone Encounter (Signed)
[place] called patient regarding her Cabometyx. Patient reported that she has stopped taking her cabometyx on her own. Had not notified [name].   MD notified. Patient has a follow up on 06/24/19

## 2019-06-24 ENCOUNTER — Ambulatory Visit: Payer: Medicare Other | Admitting: Internal Medicine

## 2019-06-24 ENCOUNTER — Other Ambulatory Visit: Payer: Medicare Other

## 2019-07-01 ENCOUNTER — Inpatient Hospital Stay: Payer: Medicare Other | Admitting: Internal Medicine

## 2019-07-01 ENCOUNTER — Inpatient Hospital Stay: Payer: Medicare Other | Attending: Internal Medicine

## 2019-07-01 ENCOUNTER — Other Ambulatory Visit: Payer: Self-pay

## 2019-07-01 ENCOUNTER — Encounter: Payer: Self-pay | Admitting: Internal Medicine

## 2019-07-01 ENCOUNTER — Inpatient Hospital Stay: Payer: Medicare Other

## 2019-07-01 VITALS — BP 131/73 | HR 54 | Temp 98.3°F | Resp 18 | Ht 65.0 in | Wt 114.9 lb

## 2019-07-01 DIAGNOSIS — R630 Anorexia: Secondary | ICD-10-CM | POA: Diagnosis not present

## 2019-07-01 DIAGNOSIS — Z5111 Encounter for antineoplastic chemotherapy: Secondary | ICD-10-CM | POA: Diagnosis not present

## 2019-07-01 DIAGNOSIS — C641 Malignant neoplasm of right kidney, except renal pelvis: Secondary | ICD-10-CM | POA: Diagnosis not present

## 2019-07-01 DIAGNOSIS — C3411 Malignant neoplasm of upper lobe, right bronchus or lung: Secondary | ICD-10-CM | POA: Diagnosis not present

## 2019-07-01 DIAGNOSIS — R531 Weakness: Secondary | ICD-10-CM | POA: Diagnosis not present

## 2019-07-01 DIAGNOSIS — C649 Malignant neoplasm of unspecified kidney, except renal pelvis: Secondary | ICD-10-CM

## 2019-07-01 DIAGNOSIS — I10 Essential (primary) hypertension: Secondary | ICD-10-CM | POA: Diagnosis not present

## 2019-07-01 DIAGNOSIS — R634 Abnormal weight loss: Secondary | ICD-10-CM | POA: Insufficient documentation

## 2019-07-01 DIAGNOSIS — C3432 Malignant neoplasm of lower lobe, left bronchus or lung: Secondary | ICD-10-CM | POA: Diagnosis not present

## 2019-07-01 DIAGNOSIS — R5383 Other fatigue: Secondary | ICD-10-CM | POA: Diagnosis not present

## 2019-07-01 DIAGNOSIS — Z95828 Presence of other vascular implants and grafts: Secondary | ICD-10-CM

## 2019-07-01 LAB — CBC WITH DIFFERENTIAL (CANCER CENTER ONLY)
Abs Immature Granulocytes: 0.29 10*3/uL — ABNORMAL HIGH (ref 0.00–0.07)
Basophils Absolute: 0 10*3/uL (ref 0.0–0.1)
Basophils Relative: 0 %
Eosinophils Absolute: 0 10*3/uL (ref 0.0–0.5)
Eosinophils Relative: 0 %
HCT: 30.8 % — ABNORMAL LOW (ref 36.0–46.0)
Hemoglobin: 9 g/dL — ABNORMAL LOW (ref 12.0–15.0)
Immature Granulocytes: 2 %
Lymphocytes Relative: 6 %
Lymphs Abs: 0.9 10*3/uL (ref 0.7–4.0)
MCH: 34.6 pg — ABNORMAL HIGH (ref 26.0–34.0)
MCHC: 29.2 g/dL — ABNORMAL LOW (ref 30.0–36.0)
MCV: 118.5 fL — ABNORMAL HIGH (ref 80.0–100.0)
Monocytes Absolute: 0.4 10*3/uL (ref 0.1–1.0)
Monocytes Relative: 3 %
Neutro Abs: 14.4 10*3/uL — ABNORMAL HIGH (ref 1.7–7.7)
Neutrophils Relative %: 89 %
Platelet Count: 256 10*3/uL (ref 150–400)
RBC: 2.6 MIL/uL — ABNORMAL LOW (ref 3.87–5.11)
RDW: 17.4 % — ABNORMAL HIGH (ref 11.5–15.5)
WBC Count: 16.1 10*3/uL — ABNORMAL HIGH (ref 4.0–10.5)
nRBC: 0.2 % (ref 0.0–0.2)

## 2019-07-01 LAB — CMP (CANCER CENTER ONLY)
ALT: 15 U/L (ref 0–44)
AST: 14 U/L — ABNORMAL LOW (ref 15–41)
Albumin: 3.2 g/dL — ABNORMAL LOW (ref 3.5–5.0)
Alkaline Phosphatase: 60 U/L (ref 38–126)
Anion gap: 10 (ref 5–15)
BUN: 41 mg/dL — ABNORMAL HIGH (ref 8–23)
CO2: 25 mmol/L (ref 22–32)
Calcium: 9.3 mg/dL (ref 8.9–10.3)
Chloride: 111 mmol/L (ref 98–111)
Creatinine: 1.42 mg/dL — ABNORMAL HIGH (ref 0.44–1.00)
GFR, Est AFR Am: 43 mL/min — ABNORMAL LOW (ref >60)
GFR, Estimated: 37 mL/min — ABNORMAL LOW (ref >60)
Glucose, Bld: 134 mg/dL — ABNORMAL HIGH (ref 70–99)
Potassium: 4 mmol/L (ref 3.5–5.1)
Sodium: 146 mmol/L — ABNORMAL HIGH (ref 135–145)
Total Bilirubin: 0.3 mg/dL (ref 0.3–1.2)
Total Protein: 6.2 g/dL — ABNORMAL LOW (ref 6.5–8.1)

## 2019-07-01 MED ORDER — HEPARIN SOD (PORK) LOCK FLUSH 100 UNIT/ML IV SOLN
500.0000 [IU] | Freq: Once | INTRAVENOUS | Status: AC | PRN
  Administered 2019-07-01: 12:00:00 500 [IU] via INTRAVENOUS
  Filled 2019-07-01: qty 5

## 2019-07-01 MED ORDER — SODIUM CHLORIDE 0.9% FLUSH
10.0000 mL | INTRAVENOUS | Status: DC | PRN
  Administered 2019-07-01: 10 mL via INTRAVENOUS
  Filled 2019-07-01: qty 10

## 2019-07-01 NOTE — Progress Notes (Signed)
[place] [phone]   [phone]  OFFICE PROGRESS NOTE  [name], MD [address] [place] [address]  DIAGNOSIS:  1) Metastatic renal cell carcinoma, clear cell type, WHO nuclear grade 3 initially diagnosed in September 2010 and recently presented with bilateral pulmonary nodules. 2) history of renal cell carcinoma status post right nephrectomy in September 2010.  PRIOR THERAPY: 1)Stereotactic radiotherapy to pulmonary nodules based on the metabolic activity with no tissue diagnosis. This was performed under the care of Dr. Pablo Ledger. 2)first line treatment with immunotherapy with Nivolumab 3 MG/KG and Ipilumumab 1 mg/Kg every 3 weeks for 4 cycles followed by maintenance Nivolumab every 3 weeks. First dose 12/27/2016. Status post 3cycles.Stopped due to severe itching. 3) Nivolumab480 mg IV every 4 weeksas a single agentstartingon 05/03/2017.Status post2cycles.  Discontinued secondary to disease progression.  CURRENT THERAPY: Cabometyx 40 mg daily.  First dose started on 07/04/2017.  Status post 23  months of treatment.  The patient decided to discontinue her treatment on her own.  INTERVAL HISTORY: Melanie Best 74 y.o. female returns to the clinic today for follow-up visit.  The patient continues to complain of increasing fatigue and weakness.  She decided to discontinue her current treatment with Cabometyx and to let the cancer takes its course with no further treatment.  She denied having any current chest pain, shortness of breath, cough or hemoptysis.  She denied having any fever or chills.  She has no nausea, vomiting, diarrhea or constipation.  She lost 5 more pounds since her last visit.  She is here today for evaluation and a final visit.  MEDICAL HISTORY: Past Medical History:  Diagnosis Date  . Anemia   . Anxiety   . Arthritis    R leg  . Cancer ([place])    kidney  . Chronic fatigue 12/13/2016    . CKD (chronic kidney disease), stage II    removed R kidney- 2010- for Cancer, followed by Dr. Diona Fanti  . COPD (chronic obstructive pulmonary disease) (HCC)    uses O2-2 liters , continuously , followed by Dr. Chase Caller  . Cough 08-02-2015  . Diabetes mellitus, type 2 ([place])   . DM (diabetes mellitus) ([place]) 01/27/2012  . DVT (deep venous thrombosis), left    2011- treated /w coumadin   . Dyslipidemia   . Dysrhythmia   . Esophageal reflux   . GERD (gastroesophageal reflux disease) 01/27/2012  . Glaucoma   . Goals of care, counseling/discussion 12/13/2016  . Hemoptysis 08-02-15  . History of radiation therapy 01/09/12,01/11/12,01/15/12,01/17/12,& 01/19/12   rul lung,50Gy/19fx  . HTN (hypertension) 01/27/2012  . Hyperlipidemia 01/27/2012  . Hypertension    sees Dr. Deforest Hoyles for PCP, denies ever having stress or ECHO, ?when & where she had  last  EKG  . Leukocytosis   . Lung cancer ([place])    NSCLC  . Osteopenia   . Renal cell carcinoma of right kidney ([place]) 12/13/2016  . Shortness of breath   . Syncope   . Tremor   . Urinary incontinence   . Vitamin D deficiency     ALLERGIES:  has No Known Allergies.  MEDICATIONS:  Current Outpatient Medications  Medication Sig Dispense Refill  . alendronate (FOSAMAX) 70 MG tablet Take 70 mg by mouth once a week.    . brinzolamide (AZOPT) 1 % ophthalmic suspension Place 1 drop into both eyes 3 (three) times daily. Reported on 08/12/2015    . CABOMETYX 40 MG  tablet TAKE 1 TABLET (40MG ) BY MOUTH ONCE DAILY. TAKE ON AN EMPTY STOMACH 1 HOUR BEFORE OR 2 HOURS AFTER MEALS 30 tablet 1  . CALCIUM PO Take by mouth daily.    . clonazePAM (KLONOPIN) 1 MG tablet Take 1 mg by mouth 2 (two) times daily as needed. For anxiety    . diltiazem (CARDIZEM CD) 180 MG 24 hr capsule Take 180 mg by mouth daily.  0  . ergocalciferol (VITAMIN D2) 50000 UNITS capsule Take 50,000 Units by mouth once a week. Take on Monday    . esomeprazole (NEXIUM) 40 MG capsule Take 40 mg by  mouth daily.    . furosemide (LASIX) 20 MG tablet Take 20 mg by mouth daily as needed. For fluid retention    . hydrocortisone cream 1 % Apply 1 application topically 2 (two) times daily. 30 g 0  . ipratropium-albuterol (DUONEB) 0.5-2.5 (3) MG/3ML SOLN Take 3 mLs by nebulization 4 (four) times daily as needed. 360 mL 4  . JANUVIA 50 MG tablet Take 50 mg by mouth daily.     Marland Kitchen lidocaine-prilocaine (EMLA) cream Apply 1 application topically as needed.    . lovastatin (MEVACOR) 20 MG tablet Take 1 tablet by mouth daily.    Marland Kitchen LUMIGAN 0.01 % SOLN 1 drop daily.    . magic mouthwash w/lidocaine SOLN Take 5 mLs by mouth 4 (four) times daily as needed for mouth pain. Swish and spit before meals and at bedtime. (Patient not taking: Reported on 05/13/2019) 480 mL 0  . methocarbamol (ROBAXIN) 750 MG tablet Take 750 mg by mouth 2 (two) times daily as needed.  0  . methylPREDNISolone (MEDROL DOSEPAK) 4 MG TBPK tablet Use as instructed. 21 tablet 0  . mirtazapine (REMERON) 7.5 MG tablet Take 1 tablet (7.5 mg total) by mouth daily. 30 tablet 1  . mupirocin ointment (BACTROBAN) 2 % Place 1 application into the nose 2 (two) times daily. 30 g 1  . Nebulizers (VIOS AEROSOL DELIVERY SYSTEM) MISC USE UTD BY MD WITH NEBULIZER SOLUTION    . OXYGEN Inhale 2 L into the lungs continuous.    . Potassium Chloride ER 20 MEQ TBCR Take 1 tablet by mouth daily.    . predniSONE (DELTASONE) 5 MG tablet Take 4 mg by mouth daily with breakfast.     . promethazine (PHENERGAN) 25 MG tablet take 1 tablet by mouth every 6 hours if needed for nausea  0  . rivaroxaban (XARELTO) 20 MG TABS tablet Take 1 tablet (20 mg total) by mouth daily with supper. 30 tablet 3   No current facility-administered medications for this visit.   Facility-Administered Medications Ordered in Other Visits  Medication Dose Route Frequency Provider Last Rate Last Admin  . sodium chloride flush (NS) 0.9 % injection 10 mL  10 mL Intravenous PRN Curt Bears,  MD      . sodium chloride flush (NS) 0.9 % injection 10 mL  10 mL Intravenous PRN Curt Bears, MD   10 mL at 12/31/18 1348  . sodium chloride flush (NS) 0.9 % injection 10 mL  10 mL Intravenous PRN Curt Bears, MD   10 mL at 07/01/19 1216    SURGICAL HISTORY:  Past Surgical History:  Procedure Laterality Date  . BACK SURGERY     for pinched nerve, 2011, here at [facility]  . EYE SURGERY     cataracts removed, ?IOL  . FLEXIBLE BRONCHOSCOPY  11/29/2011  . IR FLUORO GUIDE PORT INSERTION RIGHT  01/30/2017  .  IR US GUIDE VASC ACCESS RIGHT  01/30/2017  . removed blood clot     left upper abd quad   . right kidney removed    . right nephrectomy      REVIEW OF SYSTEMS:  Constitutional: positive for anorexia, fatigue and weight loss Eyes: negative Ears, nose, mouth, throat, and face: negative Respiratory: negative Cardiovascular: negative Gastrointestinal: negative Genitourinary:negative Integument/breast: negative Hematologic/lymphatic: negative Musculoskeletal:positive for arthralgias and muscle weakness Neurological: negative Behavioral/Psych: negative Endocrine: negative Allergic/Immunologic: negative   PHYSICAL EXAMINATION: General appearance: alert, cooperative, fatigued and no distress Head: Normocephalic, without obvious abnormality, atraumatic Neck: no adenopathy, no JVD, supple, symmetrical, trachea midline and thyroid not enlarged, symmetric, no tenderness/mass/nodules Lymph nodes: Cervical, supraclavicular, and axillary nodes normal. Resp: clear to auscultation bilaterally Back: symmetric, no curvature. ROM normal. No CVA tenderness. Cardio: regular rate and rhythm, S1, S2 normal, no murmur, click, rub or gallop GI: soft, non-tender; bowel sounds normal; no masses,  no organomegaly Extremities: extremities normal, atraumatic, no cyanosis or edema Neurologic: Alert and oriented X 3, normal strength and tone. Normal symmetric reflexes. Normal coordination and  gait  ECOG PERFORMANCE STATUS: 1 - Symptomatic but completely ambulatory  Blood pressure 131/73, pulse (!) 54, temperature 98.3 F (36.8 C), temperature source Temporal, resp. rate 18, height 5\' 5"  (1.651 m), weight 114 lb 14.4 oz (52.1 kg), SpO2 99 %.  LABORATORY DATA: Lab Results  Component Value Date   WBC 16.1 (H) 07/01/2019   HGB 9.0 (L) 07/01/2019   HCT 30.8 (L) 07/01/2019   MCV 118.5 (H) 07/01/2019   PLT 256 07/01/2019      Chemistry      Component Value Date/Time   NA 144 05/13/2019 1134   NA 141 03/08/2017 1258   K 3.9 05/13/2019 1134   K 3.7 03/08/2017 1258   CL 101 05/13/2019 1134   CO2 27 05/13/2019 1134   CO2 23 03/08/2017 1258   BUN 25 (H) 05/13/2019 1134   BUN 24.1 03/08/2017 1258   CREATININE 1.33 (H) 05/13/2019 1134   CREATININE 1.4 (H) 03/08/2017 1258      Component Value Date/Time   CALCIUM 9.8 05/13/2019 1134   CALCIUM 8.8 03/08/2017 1258   ALKPHOS 56 05/13/2019 1134   ALKPHOS 65 03/08/2017 1258   AST 25 05/13/2019 1134   AST 8 03/08/2017 1258   ALT 27 05/13/2019 1134   ALT 20 03/08/2017 1258   BILITOT 0.5 05/13/2019 1134   BILITOT 0.40 03/08/2017 1258       RADIOGRAPHIC STUDIES: No results found.  ASSESSMENT AND PLAN: This is a very pleasant 74 years old African-American female with metastatic renal cell carcinoma that was initially diagnosed in 2010 and now presenting with bilateral pulmonary nodules. She was treated with stereotactic radiotherapy to hypermetabolic nodules without tissue diagnosis in the past. Recent imaging studies showed hypermetabolic and enlarging bilateral pulmonary nodules. CT-guided core biopsy of right pulmonary nodule was consistent with metastatic renal cell carcinoma. The patient was treated with a combination of ipilimumab and nivolumab for 3 cycles but ipilimumab was discontinued secondary to significant itching that was intolerable for the patient.  She was treated with 2 more cycles of single agent nivolumab  which was discontinued secondary to disease progression. She is currently undergoing treatment with oral Cabometyx 40 mg p.o. Daily.  Status post 23 months of treatment.  The patient was tolerating her treatment well with no concerning adverse effect except for the fatigue and weight loss.  She decided on her own to discontinue  her treatment.  She is not interested and any other treatment options and she would like the cancer to takes its course.  I discussed with the patient her current condition and any interested in different treatment options but she declined.  She is not interested and monitoring her cancer at this point and will follow up with her primary care physician as previously planned. I explained to the patient that I will have her appointment open if she changes her mind regarding treatment in the future. She will continue to have Port-A-Cath flush every 6 weeks. For the lack of appetite and weight loss she will continue her current treatment with Remeron. The patient was advised to call if she has any concerning symptoms. The patient voices understanding of current disease status and treatment options and is in agreement with the current care plan. All questions were answered. The patient knows to call the clinic with any problems, questions or concerns. We can certainly see the patient much sooner if necessary.  Disclaimer: This note was dictated with voice recognition software. Similar sounding words can inadvertently be transcribed and may not be corrected upon review.

## 2019-07-15 ENCOUNTER — Inpatient Hospital Stay: Payer: Medicare Other

## 2019-07-29 ENCOUNTER — Inpatient Hospital Stay: Payer: Medicare Other | Attending: Internal Medicine

## 2019-07-29 ENCOUNTER — Other Ambulatory Visit: Payer: Self-pay

## 2019-07-29 DIAGNOSIS — C641 Malignant neoplasm of right kidney, except renal pelvis: Secondary | ICD-10-CM | POA: Diagnosis not present

## 2019-07-29 DIAGNOSIS — Z452 Encounter for adjustment and management of vascular access device: Secondary | ICD-10-CM | POA: Diagnosis not present

## 2019-07-29 DIAGNOSIS — C7801 Secondary malignant neoplasm of right lung: Secondary | ICD-10-CM | POA: Diagnosis not present

## 2019-07-29 DIAGNOSIS — Z95828 Presence of other vascular implants and grafts: Secondary | ICD-10-CM

## 2019-07-29 MED ORDER — SODIUM CHLORIDE 0.9% FLUSH
10.0000 mL | INTRAVENOUS | Status: DC | PRN
  Administered 2019-07-29: 14:00:00 10 mL via INTRAVENOUS
  Filled 2019-07-29: qty 10

## 2019-07-29 MED ORDER — HEPARIN SOD (PORK) LOCK FLUSH 100 UNIT/ML IV SOLN
500.0000 [IU] | Freq: Once | INTRAVENOUS | Status: AC | PRN
  Administered 2019-07-29: 500 [IU] via INTRAVENOUS
  Filled 2019-07-29: qty 5

## 2019-09-15 ENCOUNTER — Other Ambulatory Visit: Payer: Self-pay | Admitting: Physician Assistant

## 2019-09-25 ENCOUNTER — Other Ambulatory Visit: Payer: Self-pay

## 2019-09-26 ENCOUNTER — Other Ambulatory Visit: Payer: Self-pay

## 2019-09-26 ENCOUNTER — Inpatient Hospital Stay: Payer: Medicare Other | Attending: Internal Medicine | Admitting: Hematology & Oncology

## 2019-09-26 ENCOUNTER — Inpatient Hospital Stay: Payer: Medicare Other

## 2019-09-26 VITALS — BP 136/67 | HR 96 | Temp 98.2°F | Resp 18 | Ht 65.0 in | Wt 118.0 lb

## 2019-09-26 DIAGNOSIS — Z87891 Personal history of nicotine dependence: Secondary | ICD-10-CM | POA: Diagnosis not present

## 2019-09-26 DIAGNOSIS — Z9981 Dependence on supplemental oxygen: Secondary | ICD-10-CM | POA: Diagnosis not present

## 2019-09-26 DIAGNOSIS — Z801 Family history of malignant neoplasm of trachea, bronchus and lung: Secondary | ICD-10-CM | POA: Insufficient documentation

## 2019-09-26 DIAGNOSIS — J449 Chronic obstructive pulmonary disease, unspecified: Secondary | ICD-10-CM | POA: Diagnosis not present

## 2019-09-26 DIAGNOSIS — Z808 Family history of malignant neoplasm of other organs or systems: Secondary | ICD-10-CM | POA: Diagnosis not present

## 2019-09-26 DIAGNOSIS — C641 Malignant neoplasm of right kidney, except renal pelvis: Secondary | ICD-10-CM | POA: Insufficient documentation

## 2019-09-26 DIAGNOSIS — C649 Malignant neoplasm of unspecified kidney, except renal pelvis: Secondary | ICD-10-CM

## 2019-09-26 DIAGNOSIS — C7800 Secondary malignant neoplasm of unspecified lung: Secondary | ICD-10-CM | POA: Diagnosis not present

## 2019-09-26 LAB — CMP (CANCER CENTER ONLY)
ALT: 20 U/L (ref 0–44)
AST: 22 U/L (ref 15–41)
Albumin: 4 g/dL (ref 3.5–5.0)
Alkaline Phosphatase: 58 U/L (ref 38–126)
Anion gap: 11 (ref 5–15)
BUN: 42 mg/dL — ABNORMAL HIGH (ref 8–23)
CO2: 25 mmol/L (ref 22–32)
Calcium: 8.9 mg/dL (ref 8.9–10.3)
Chloride: 106 mmol/L (ref 98–111)
Creatinine: 1.55 mg/dL — ABNORMAL HIGH (ref 0.44–1.00)
GFR, Est AFR Am: 38 mL/min — ABNORMAL LOW (ref >60)
GFR, Estimated: 33 mL/min — ABNORMAL LOW (ref >60)
Glucose, Bld: 119 mg/dL — ABNORMAL HIGH (ref 70–99)
Potassium: 3.8 mmol/L (ref 3.5–5.1)
Sodium: 142 mmol/L (ref 135–145)
Total Bilirubin: 0.3 mg/dL (ref 0.3–1.2)
Total Protein: 6.4 g/dL — ABNORMAL LOW (ref 6.5–8.1)

## 2019-09-26 LAB — CBC WITH DIFFERENTIAL (CANCER CENTER ONLY)
Abs Immature Granulocytes: 0.17 10*3/uL — ABNORMAL HIGH (ref 0.00–0.07)
Basophils Absolute: 0.1 10*3/uL (ref 0.0–0.1)
Basophils Relative: 0 %
Eosinophils Absolute: 0 10*3/uL (ref 0.0–0.5)
Eosinophils Relative: 0 %
HCT: 31.2 % — ABNORMAL LOW (ref 36.0–46.0)
Hemoglobin: 9.1 g/dL — ABNORMAL LOW (ref 12.0–15.0)
Immature Granulocytes: 1 %
Lymphocytes Relative: 9 %
Lymphs Abs: 1.3 10*3/uL (ref 0.7–4.0)
MCH: 30.3 pg (ref 26.0–34.0)
MCHC: 29.2 g/dL — ABNORMAL LOW (ref 30.0–36.0)
MCV: 104 fL — ABNORMAL HIGH (ref 80.0–100.0)
Monocytes Absolute: 0.6 10*3/uL (ref 0.1–1.0)
Monocytes Relative: 4 %
Neutro Abs: 12.2 10*3/uL — ABNORMAL HIGH (ref 1.7–7.7)
Neutrophils Relative %: 86 %
Platelet Count: 229 10*3/uL (ref 150–400)
RBC: 3 MIL/uL — ABNORMAL LOW (ref 3.87–5.11)
RDW: 16.2 % — ABNORMAL HIGH (ref 11.5–15.5)
WBC Count: 14.3 10*3/uL — ABNORMAL HIGH (ref 4.0–10.5)
nRBC: 0 % (ref 0.0–0.2)

## 2019-09-26 LAB — LACTATE DEHYDROGENASE: LDH: 339 U/L — ABNORMAL HIGH (ref 98–192)

## 2019-09-26 NOTE — Progress Notes (Signed)
Referral MD  Reason for Referral: Metastatic kidney cancer-right kidney removed back in 2010  Chief Complaint  Patient presents with  . New Patient (Initial Visit)  : I have kidney cancer and I just cannot take the medicine I am given.  HPI: Melanie Best is a very charming 74 year old African-American female.  She has oxygen secondary to COPD.  She has been oxygen for 10 years.  She has had a past history of heavy tobacco use.  She apparently had her right kidney removed back in September 2010.  The pathology report ([id number]) showed a clear cell carcinoma of the kidney.  It was 8 cm.  There was invasion into the inferior vena cava.  Renal vein margin was positive.   No lymph nodes were removed.  She was followed.  There is no indication at that time for adjuvant therapy.  Unfortunately, in September 2018, she was found to have some lung nodules.  She had a biopsy one of the lung nodules.  The pathology report ([id number]) showed metastatic clear cell carcinoma the kidney.  She was referred to oncology at [facility].  She was treated by Dr. Earlie Server, who as expected, did a fantastic job with her.  He started her on immunotherapy with nivolumab and ipilimumab.  She had 3 cycles of treatment.  Treatment that had to stop due to severe itching.  She was subsequently treated with single agent nivolumab.  She had 2 cycles of treatment and unfortunately had disease progression.  She was then placed on Cabometyx.  She is on 40 mg daily.  She is started on this on July 04, 2017.  Appetite started to go down.  Looks like she is lost quite a bit of weight.  She was placed on Remeron and prednisone.  This was back in 2019.  In April 2021, looks like she stopped the Cabometyx itself.  She was having increased fatigue and weakness.  She was having some weight loss.  I think from I can tell her last scans were back in January.  I really think she needs some additional scans to see if she  is progressing.  I would have to believe that she has had molecular studies done on her malignancy.  Maybe she has not.  She has had a marginal performance status.  I would have to say that her performance status is probably no better than ECOG 2-3.  There is no diarrhea.  She has had no obvious bleeding.  There is no headache.  She is on the oxygen secondary to her COPD.  She comes to the [place] to see if there is other options that she may have for this metastatic renal cell carcinoma.    Past Medical History:  Diagnosis Date  . Anemia   . Anxiety   . Arthritis    R leg  . Cancer ([place])    kidney  . Chronic fatigue 12/13/2016  . CKD (chronic kidney disease), stage II    removed R kidney- 2010- for Cancer, followed by Dr. Diona Fanti  . COPD (chronic obstructive pulmonary disease) (HCC)    uses O2-2 liters , continuously , followed by Dr. Chase Caller  . Cough 08-02-2015  . Diabetes mellitus, type 2 ([place])   . DM (diabetes mellitus) ([place]) 01/27/2012  . DVT (deep venous thrombosis), left    2011- treated /w coumadin   . Dyslipidemia   . Dysrhythmia   . Esophageal reflux   . GERD (gastroesophageal reflux disease)  01/27/2012  . Glaucoma   . Goals of care, counseling/discussion 12/13/2016  . Hemoptysis 08-02-15  . History of radiation therapy 01/09/12,01/11/12,01/15/12,01/17/12,& 01/19/12   rul lung,50Gy/27fx  . HTN (hypertension) 01/27/2012  . Hyperlipidemia 01/27/2012  . Hypertension    sees Dr. Deforest Hoyles for PCP, denies ever having stress or ECHO, ?when & where she had  last  EKG  . Leukocytosis   . Lung cancer ([place])    NSCLC  . Osteopenia   . Renal cell carcinoma of right kidney ([place]) 12/13/2016  . Shortness of breath   . Syncope   . Tremor   . Urinary incontinence   . Vitamin D deficiency   :  Past Surgical History:  Procedure Laterality Date  . BACK SURGERY     for pinched nerve, 2011, here at [facility]  . EYE SURGERY     cataracts removed, ?IOL  .  FLEXIBLE BRONCHOSCOPY  11/29/2011  . IR FLUORO GUIDE PORT INSERTION RIGHT  01/30/2017  . IR US GUIDE VASC ACCESS RIGHT  01/30/2017  . removed blood clot     left upper abd quad   . right kidney removed    . right nephrectomy    :   Current Outpatient Medications:  .  alendronate (FOSAMAX) 70 MG tablet, Take 70 mg by mouth once a week., Disp: , Rfl:  .  brinzolamide (AZOPT) 1 % ophthalmic suspension, Place 1 drop into both eyes 3 (three) times daily. Reported on 08/12/2015, Disp: , Rfl:  .  CALCIUM PO, Take by mouth daily., Disp: , Rfl:  .  clonazePAM (KLONOPIN) 1 MG tablet, Take 1 mg by mouth 2 (two) times daily as needed. For anxiety, Disp: , Rfl:  .  diltiazem (CARDIZEM CD) 180 MG 24 hr capsule, Take 180 mg by mouth daily., Disp: , Rfl: 0 .  ergocalciferol (VITAMIN D2) 50000 UNITS capsule, Take 50,000 Units by mouth once a week. Take on Monday, Disp: , Rfl:  .  esomeprazole (NEXIUM) 40 MG capsule, Take 40 mg by mouth daily., Disp: , Rfl:  .  furosemide (LASIX) 20 MG tablet, Take 20 mg by mouth daily as needed. For fluid retention, Disp: , Rfl:  .  hydrocortisone cream 1 %, Apply 1 application topically 2 (two) times daily., Disp: 30 g, Rfl: 0 .  ipratropium-albuterol (DUONEB) 0.5-2.5 (3) MG/3ML SOLN, Take 3 mLs by nebulization 4 (four) times daily as needed., Disp: 360 mL, Rfl: 4 .  JANUVIA 50 MG tablet, Take 50 mg by mouth daily. , Disp: , Rfl:  .  lidocaine-prilocaine (EMLA) cream, Apply 1 application topically as needed., Disp: , Rfl:  .  lovastatin (MEVACOR) 20 MG tablet, Take 1 tablet by mouth daily., Disp: , Rfl:  .  LUMIGAN 0.01 % SOLN, 1 drop daily., Disp: , Rfl:  .  magic mouthwash w/lidocaine SOLN, Take 5 mLs by mouth 4 (four) times daily as needed for mouth pain. Swish and spit before meals and at bedtime. (Patient not taking: Reported on 05/13/2019), Disp: 480 mL, Rfl: 0 .  methocarbamol (ROBAXIN) 750 MG tablet, Take 750 mg by mouth 2 (two) times daily as needed., Disp: , Rfl:  0 .  mirtazapine (REMERON) 7.5 MG tablet, Take 1 tablet (7.5 mg total) by mouth daily., Disp: 30 tablet, Rfl: 1 .  mupirocin ointment (BACTROBAN) 2 %, Place 1 application into the nose 2 (two) times daily., Disp: 30 g, Rfl: 1 .  Nebulizers (VIOS AEROSOL DELIVERY SYSTEM) MISC, USE UTD BY MD WITH NEBULIZER SOLUTION,  Disp: , Rfl:  .  OXYGEN, Inhale 2 L into the lungs continuous., Disp: , Rfl:  .  Potassium Chloride ER 20 MEQ TBCR, Take 1 tablet by mouth daily., Disp: , Rfl:  .  predniSONE (DELTASONE) 10 MG tablet, Take 10 mg by mouth daily with breakfast. , Disp: , Rfl:  .  promethazine (PHENERGAN) 25 MG tablet, take 1 tablet by mouth every 6 hours if needed for nausea, Disp: , Rfl: 0 .  XARELTO 20 MG TABS tablet, TAKE 1 TABLET BY MOUTH  DAILY WITH SUPPER, Disp: 30 tablet, Rfl: 11 No current facility-administered medications for this visit.  Facility-Administered Medications Ordered in Other Visits:  .  sodium chloride flush (NS) 0.9 % injection 10 mL, 10 mL, Intravenous, PRN, Melanie Bears, MD .  sodium chloride flush (NS) 0.9 % injection 10 mL, 10 mL, Intravenous, PRN, Melanie Bears, MD, 10 mL at 12/31/18 1348:  :  No Known Allergies:  Family History  Problem Relation Age of Onset  . Emphysema Father   . Cancer Sister        behind eye  . Cancer Brother        lung  . Cancer Brother        lung  . Cancer Brother        throat  . Breast cancer Neg Hx   :  Social History   Socioeconomic History  . Marital status: Married    Spouse name: Not on file  . Number of children: Not on file  . Years of education: Not on file  . Highest education level: Not on file  Occupational History  . Not on file  Tobacco Use  . Smoking status: Former Smoker    Packs/day: 1.00    Years: 35.00    Pack years: 35.00    Types: Cigarettes    Quit date: 03/20/2004    Years since quitting: 15.5  . Smokeless tobacco: Never Used  Vaping Use  . Vaping Use: Never used  Substance and Sexual  Activity  . Alcohol use: No  . Drug use: No  . Sexual activity: Not on file  Other Topics Concern  . Not on file  Social History Narrative  . Not on file   Social Determinants of Health   Financial Resource Strain:   . Difficulty of Paying Living Expenses:   Food Insecurity:   . Worried About Charity fundraiser in the Last Year:   . Arboriculturist in the Last Year:   Transportation Needs:   . Film/video editor (Medical):   Marland Kitchen Lack of Transportation (Non-Medical):   Physical Activity:   . Days of Exercise per Week:   . Minutes of Exercise per Session:   Stress:   . Feeling of Stress :   Social Connections:   . Frequency of Communication with Friends and Family:   . Frequency of Social Gatherings with Friends and Family:   . Attends Religious Services:   . Active Member of Clubs or Organizations:   . Attends Archivist Meetings:   Marland Kitchen Marital Status:   Intimate Partner Violence:   . Fear of Current or Ex-Partner:   . Emotionally Abused:   Marland Kitchen Physically Abused:   . Sexually Abused:   :  Review of Systems  Constitutional: Positive for malaise/fatigue and weight loss.  HENT: Negative.   Eyes: Negative.   Respiratory: Positive for shortness of breath and wheezing.   Cardiovascular: Positive for palpitations and  leg swelling.  Gastrointestinal: Positive for diarrhea and nausea.  Genitourinary: Negative.   Musculoskeletal: Positive for back pain, falls and joint pain.  Skin: Positive for itching.  Neurological: Positive for dizziness and weakness.  Endo/Heme/Allergies: Negative.      Exam:  This is a somewhat thin African-American female who actually looks younger than her age.  Her vital signs are temperature 98.2.  Pulse 96.  Blood pressure 136/67.  Weight is 118 pounds.  Head and neck exam shows no ocular or oral lesions.  She has some temporal muscle wasting.  She has no thrush.  There is no adenopathy on the neck.  Lungs are there is some slight  decreased breath sounds bilaterally.  She has some wheezes bilaterally.  Cardiac exam is regular rate and rhythm.  She has 1/6 systolic ejection murmur.  She may have an occasional extra beat.  Abdomen is soft.  She has decent bowel sounds.  There is no fluid wave.  There is no guarding or rebound tenderness.  She has no palpable hepatosplenomegaly.  Extremities show some slight edema in her lower legs.  She has symmetric decreased strength in upper and lower extremities.  Neurological exam shows no focal neurological deficit.  Skin exam shows some scattered ecchymoses.   @IPVITALS @   Recent Labs    09/26/19 1002  WBC 14.3*  HGB 9.1*  HCT 31.2*  PLT 229   Recent Labs    09/26/19 1002  NA 142  K 3.8  CL 106  CO2 25  GLUCOSE 119*  BUN 42*  CREATININE 1.55*  CALCIUM 8.9    Blood smear review: None  Pathology: None    Assessment and Plan: Melanie Best is a very nice 74 year old Afro-American female.  She has metastatic renal cell carcinoma.  She has been on immunotherapy.  She is been on Cabometyx.  She apparently had a hard time with the Cabometyx.  I think we would help to get some scans on her to see exactly what we are dealing with with respect to her cancer spreading.  It also would be reasonable to see about a biopsy.  Would be nice to try to get tissues to send off for molecular markers.  As far as treatment options go, there is still a few that can be available.  There is a newly approved oral VGFR inhibitor- Tivanonib Lolita Lenz) activity try.  I know that Avastin can also be utilized.  There is also Afinitor and Temsirolimus.  Her quality of life and performance status is just not that great so we are going to have to be careful with this.  This clearly is all about quality of life.  Is about trying to help her quality of life if we can.  I spent about an hour with Melanie Best and her husband.  They are both very very nice.  I gave her a prayer blanket which she was very  thankful for.  I likely will plan to get her back to see [place] in several weeks.  Let us see what the CAT scan shows.

## 2019-09-29 ENCOUNTER — Other Ambulatory Visit: Payer: Self-pay

## 2019-09-29 ENCOUNTER — Emergency Department (HOSPITAL_COMMUNITY)
Admission: EM | Admit: 2019-09-29 | Discharge: 2019-09-29 | Disposition: A | Discharge status: Home / Self Care | Payer: Medicare Other | Attending: Emergency Medicine | Admitting: Emergency Medicine

## 2019-09-29 ENCOUNTER — Emergency Department (HOSPITAL_COMMUNITY): Payer: Medicare Other

## 2019-09-29 ENCOUNTER — Telehealth: Payer: Self-pay | Admitting: Hematology & Oncology

## 2019-09-29 ENCOUNTER — Encounter (HOSPITAL_COMMUNITY): Payer: Self-pay | Admitting: Emergency Medicine

## 2019-09-29 DIAGNOSIS — R310 Gross hematuria: Secondary | ICD-10-CM | POA: Diagnosis not present

## 2019-09-29 DIAGNOSIS — Z86718 Personal history of other venous thrombosis and embolism: Secondary | ICD-10-CM | POA: Diagnosis not present

## 2019-09-29 DIAGNOSIS — Z7901 Long term (current) use of anticoagulants: Secondary | ICD-10-CM | POA: Diagnosis not present

## 2019-09-29 DIAGNOSIS — E1122 Type 2 diabetes mellitus with diabetic chronic kidney disease: Secondary | ICD-10-CM | POA: Insufficient documentation

## 2019-09-29 DIAGNOSIS — N182 Chronic kidney disease, stage 2 (mild): Secondary | ICD-10-CM | POA: Insufficient documentation

## 2019-09-29 DIAGNOSIS — Z87891 Personal history of nicotine dependence: Secondary | ICD-10-CM | POA: Diagnosis not present

## 2019-09-29 DIAGNOSIS — Z85118 Personal history of other malignant neoplasm of bronchus and lung: Secondary | ICD-10-CM | POA: Diagnosis not present

## 2019-09-29 DIAGNOSIS — Z79899 Other long term (current) drug therapy: Secondary | ICD-10-CM | POA: Diagnosis not present

## 2019-09-29 DIAGNOSIS — Z85520 Personal history of malignant carcinoid tumor of kidney: Secondary | ICD-10-CM | POA: Diagnosis not present

## 2019-09-29 DIAGNOSIS — I129 Hypertensive chronic kidney disease with stage 1 through stage 4 chronic kidney disease, or unspecified chronic kidney disease: Secondary | ICD-10-CM | POA: Insufficient documentation

## 2019-09-29 DIAGNOSIS — J449 Chronic obstructive pulmonary disease, unspecified: Secondary | ICD-10-CM | POA: Diagnosis not present

## 2019-09-29 DIAGNOSIS — R319 Hematuria, unspecified: Secondary | ICD-10-CM

## 2019-09-29 LAB — CBC
HCT: 29.8 % — ABNORMAL LOW (ref 36.0–46.0)
Hemoglobin: 8.9 g/dL — ABNORMAL LOW (ref 12.0–15.0)
MCH: 31 pg (ref 26.0–34.0)
MCHC: 29.9 g/dL — ABNORMAL LOW (ref 30.0–36.0)
MCV: 103.8 fL — ABNORMAL HIGH (ref 80.0–100.0)
Platelets: 238 10*3/uL (ref 150–400)
RBC: 2.87 MIL/uL — ABNORMAL LOW (ref 3.87–5.11)
RDW: 16.1 % — ABNORMAL HIGH (ref 11.5–15.5)
WBC: 13.9 10*3/uL — ABNORMAL HIGH (ref 4.0–10.5)
nRBC: 0 % (ref 0.0–0.2)

## 2019-09-29 LAB — URINALYSIS, ROUTINE W REFLEX MICROSCOPIC

## 2019-09-29 LAB — BASIC METABOLIC PANEL
Anion gap: 11 (ref 5–15)
BUN: 32 mg/dL — ABNORMAL HIGH (ref 8–23)
CO2: 23 mmol/L (ref 22–32)
Calcium: 8.2 mg/dL — ABNORMAL LOW (ref 8.9–10.3)
Chloride: 109 mmol/L (ref 98–111)
Creatinine, Ser: 1.35 mg/dL — ABNORMAL HIGH (ref 0.44–1.00)
GFR calc Af Amer: 45 mL/min — ABNORMAL LOW (ref >60)
GFR calc non Af Amer: 39 mL/min — ABNORMAL LOW (ref >60)
Glucose, Bld: 116 mg/dL — ABNORMAL HIGH (ref 70–99)
Potassium: 4 mmol/L (ref 3.5–5.1)
Sodium: 143 mmol/L (ref 135–145)

## 2019-09-29 LAB — URINALYSIS, MICROSCOPIC (REFLEX): RBC / HPF: >50 RBC/hpf (ref 0–5)

## 2019-09-29 LAB — CBG MONITORING, ED: Glucose-Capillary: 83 mg/dL (ref 70–99)

## 2019-09-29 MED ORDER — IOHEXOL 300 MG/ML  SOLN
100.0000 mL | Freq: Once | INTRAMUSCULAR | Status: AC | PRN
  Administered 2019-09-29: 80 mL via INTRAVENOUS

## 2019-09-29 MED ORDER — IOHEXOL 9 MG/ML PO SOLN
ORAL | Status: AC
  Administered 2019-09-29: 500 mL via ORAL
  Filled 2019-09-29: qty 1000

## 2019-09-29 MED ORDER — SODIUM CHLORIDE 0.9 % IV BOLUS
500.0000 mL | Freq: Once | INTRAVENOUS | Status: AC
  Administered 2019-09-29: 500 mL via INTRAVENOUS

## 2019-09-29 MED ORDER — HEPARIN SOD (PORK) LOCK FLUSH 100 UNIT/ML IV SOLN
500.0000 [IU] | Freq: Once | INTRAVENOUS | Status: AC
  Administered 2019-09-29: 500 [IU]
  Filled 2019-09-29: qty 5

## 2019-09-29 MED ORDER — IOHEXOL 9 MG/ML PO SOLN
500.0000 mL | ORAL | Status: AC
  Administered 2019-09-29: 500 mL via ORAL

## 2019-09-29 MED ORDER — SODIUM CHLORIDE (PF) 0.9 % IJ SOLN
INTRAMUSCULAR | Status: AC
  Filled 2019-09-29: qty 50

## 2019-09-29 NOTE — ED Provider Notes (Signed)
[facility] DEPT Provider Note   CSN: [id number] Arrival date & time: 09/29/19  0249     History Chief Complaint  Patient presents with  . Hematuria    Melanie Best is a 74 y.o. female.  Patient presents to the emergency department for evaluation of blood in her urine.  Patient reports that she got up to go to the bathroom tonight and urinated, saw blood in it.  She has not been experiencing any fever, nausea, vomiting, back pain, abdominal pain.  Patient does take Xarelto and does have a history of renal cell carcinoma.        Past Medical History:  Diagnosis Date  . Anemia   . Anxiety   . Arthritis    R leg  . Cancer ([place])    kidney  . Chronic fatigue 12/13/2016  . CKD (chronic kidney disease), stage II    removed R kidney- 2010- for Cancer, followed by Dr. Diona Fanti  . COPD (chronic obstructive pulmonary disease) (HCC)    uses O2-2 liters , continuously , followed by Dr. Chase Caller  . Cough 08-02-2015  . Diabetes mellitus, type 2 ([place])   . DM (diabetes mellitus) ([place]) 01/27/2012  . DVT (deep venous thrombosis), left    2011- treated /w coumadin   . Dyslipidemia   . Dysrhythmia   . Esophageal reflux   . GERD (gastroesophageal reflux disease) 01/27/2012  . Glaucoma   . Goals of care, counseling/discussion 12/13/2016  . Hemoptysis 08-02-15  . History of radiation therapy 01/09/12,01/11/12,01/15/12,01/17/12,& 01/19/12   rul lung,50Gy/55fx  . HTN (hypertension) 01/27/2012  . Hyperlipidemia 01/27/2012  . Hypertension    sees Dr. Deforest Hoyles for PCP, denies ever having stress or ECHO, ?when & where she had  last  EKG  . Leukocytosis   . Lung cancer ([place])    NSCLC  . Osteopenia   . Renal cell carcinoma of right kidney ([place]) 12/13/2016  . Shortness of breath   . Syncope   . Tremor   . Urinary incontinence   . Vitamin D deficiency     Patient Active Problem List   Diagnosis Date Noted  . Insomnia 11/26/2018  . Decreased appetite  11/26/2018  . Hypokalemia 02/11/2018  . Encounter for antineoplastic chemotherapy 07/25/2017  . Port-A-Cath in place 04/05/2017  . Chronic hypoxemic respiratory failure ([place]) 01/15/2017  . Renal cell carcinoma of right kidney ([place]) 12/13/2016  . Chronic fatigue 12/13/2016  . Goals of care, counseling/discussion 12/13/2016  . Encounter for antineoplastic immunotherapy 12/13/2016  . Malignant neoplasm of left lung ([place]) 01/13/2015  . Renal insufficiency 06/30/2014  . Renal cell carcinoma ([place]) 06/30/2014  . COPD, severe ([place]) 09/13/2013  . Malignant neoplasm of lower lobe of left lung ([place]) 09/09/2013  . Malignant neoplasm of upper lobe of right lung ([place]) 09/09/2013  . Lung nodule, left upper lobe and left lower lobe 03/16/2012  . COPD exacerbation ([place]) 03/16/2012  . Chest pain 01/27/2012  . Leukocytosis 01/27/2012  . DM (diabetes mellitus) ([place]) 01/27/2012  . Hyperlipidemia 01/27/2012  . CKD (chronic kidney disease), stage II 01/27/2012  . HTN (hypertension) 01/27/2012  . Anxiety 01/27/2012  . GERD (gastroesophageal reflux disease) 01/27/2012  . RUL NSCLC Prsumed STage 1A 10/13/2011  . COPD, very severe ([employer]) 08/05/2011    Past Surgical History:  Procedure Laterality Date  . BACK SURGERY     for pinched nerve, 2011, here at [facility]  . EYE SURGERY     cataracts removed, ?IOL  . FLEXIBLE  BRONCHOSCOPY  11/29/2011  . IR FLUORO GUIDE PORT INSERTION RIGHT  01/30/2017  . IR US GUIDE VASC ACCESS RIGHT  01/30/2017  . removed blood clot     left upper abd quad   . right kidney removed    . right nephrectomy       OB History   No obstetric history on file.     Family History  Problem Relation Age of Onset  . Emphysema Father   . Cancer Sister        behind eye  . Cancer Brother        lung  . Cancer Brother        lung  . Cancer Brother        throat  . Breast cancer Neg Hx     Social History   Tobacco Use  . Smoking status: Former Smoker    Packs/day: 1.00     Years: 35.00    Pack years: 35.00    Types: Cigarettes    Quit date: 03/20/2004    Years since quitting: 15.5  . Smokeless tobacco: Never Used  Vaping Use  . Vaping Use: Never used  Substance Use Topics  . Alcohol use: No  . Drug use: No    Home Medications Prior to Admission medications   Medication Sig Start Date End Date Taking? Authorizing Provider  alendronate (FOSAMAX) 70 MG tablet Take 70 mg by mouth once a week. 03/08/19  Yes [provider]  brinzolamide (AZOPT) 1 % ophthalmic suspension Place 1 drop into both eyes 3 (three) times daily. Reported on 08/12/2015   Yes [provider]  clonazePAM (KLONOPIN) 1 MG tablet Take 1 mg by mouth 2 (two) times daily as needed. For anxiety   Yes [provider]  diltiazem (CARDIZEM CD) 180 MG 24 hr capsule Take 180 mg by mouth daily. 04/14/14  Yes [provider]  ergocalciferol (VITAMIN D2) 50000 UNITS capsule Take 50,000 Units by mouth 2 (two) times a week. Take on Monday & thursday   Yes [provider]  esomeprazole (NEXIUM) 40 MG capsule Take 40 mg by mouth daily. 04/14/19  Yes [provider]  furosemide (LASIX) 20 MG tablet Take 20 mg by mouth daily as needed. For fluid retention   Yes [provider]  ipratropium-albuterol (DUONEB) 0.5-2.5 (3) MG/3ML SOLN Take 3 mLs by nebulization 4 (four) times daily as needed. Patient taking differently: Take 3 mLs by nebulization 4 (four) times daily as needed (SOB/Wheezing).  01/13/15  Yes Brand Males, MD  lidocaine-prilocaine (EMLA) cream Apply 1 application topically as needed (port access).    Yes [provider]  lovastatin (MEVACOR) 20 MG tablet Take 20 mg by mouth daily.  08/04/18  Yes [provider]  LUMIGAN 0.01 % SOLN Place 1 drop into both eyes daily.  07/26/18  Yes [provider]  mirtazapine (REMERON) 7.5 MG tablet Take 1 tablet (7.5 mg total) by mouth daily. 06/12/19  Yes Heilingoetter, Cassandra  L, PA-C  OXYGEN Inhale 2 L into the lungs continuous.   Yes [provider]  predniSONE (DELTASONE) 10 MG tablet Take 10 mg by mouth daily with breakfast.    Yes [provider]  XARELTO 20 MG TABS tablet TAKE 1 TABLET BY MOUTH  DAILY WITH SUPPER Patient taking differently: Take 20 mg by mouth daily with supper.  09/16/19  Yes Heilingoetter, Cassandra L, PA-C  hydrocortisone cream 1 % Apply 1 application topically 2 (two) times daily. Patient  not taking: Reported on 09/29/2019 10/16/17   Curt Bears, MD  magic mouthwash w/lidocaine SOLN Take 5 mLs by mouth 4 (four) times daily as needed for mouth pain. Swish and spit before meals and at bedtime. Patient not taking: Reported on 05/13/2019 02/11/18   Maryanna Shape, NP  mupirocin ointment (BACTROBAN) 2 % Place 1 application into the nose 2 (two) times daily. Patient not taking: Reported on 09/29/2019 11/22/17   Sandi Mealy E., PA-C  Nebulizers (VIOS AEROSOL DELIVERY SYSTEM) MISC USE UTD BY MD WITH NEBULIZER SOLUTION 04/05/18   [provider]  CABOMETYX 40 MG tablet TAKE 1 TABLET (40MG ) BY MOUTH ONCE DAILY. TAKE ON AN EMPTY STOMACH 1 HOUR BEFORE OR 2 HOURS AFTER MEALS 01/09/19   Heilingoetter, Cassandra L, PA-C    Allergies    Patient has no known allergies.  Review of Systems   Review of Systems  Genitourinary: Positive for hematuria.  All other systems reviewed and are negative.   Physical Exam Updated Vital Signs BP 122/67   Pulse 88   Temp 97.9 F (36.6 C) (Oral)   Resp 18   Ht 5\' 6"  (1.676 m)   Wt 53.5 kg   SpO2 99%   BMI 19.05 kg/m   Physical Exam Vitals and nursing note reviewed.  Constitutional:      General: She is not in acute distress.    Appearance: Normal appearance. She is well-developed.  HENT:     Head: Normocephalic and atraumatic.     Right Ear: Hearing normal.     Left Ear: Hearing normal.     Nose: Nose normal.  Eyes:     Conjunctiva/sclera: Conjunctivae normal.     Pupils:  Pupils are equal, round, and reactive to light.  Cardiovascular:     Rate and Rhythm: Regular rhythm.     Heart sounds: S1 normal and S2 normal. No murmur heard.  No friction rub. No gallop.   Pulmonary:     Effort: Pulmonary effort is normal. Prolonged expiration present. No respiratory distress.     Breath sounds: Decreased breath sounds present.  Chest:     Chest wall: No tenderness.  Abdominal:     General: Bowel sounds are normal.     Palpations: Abdomen is soft.     Tenderness: There is no abdominal tenderness. There is no guarding or rebound. Negative signs include Murphy's sign and McBurney's sign.     Hernia: No hernia is present.  Musculoskeletal:        General: Normal range of motion.     Cervical back: Normal range of motion and neck supple.  Skin:    General: Skin is warm and dry.     Findings: No rash.  Neurological:     Mental Status: She is alert and oriented to person, place, and time.     GCS: GCS eye subscore is 4. GCS verbal subscore is 5. GCS motor subscore is 6.     Cranial Nerves: No cranial nerve deficit.     Sensory: No sensory deficit.     Coordination: Coordination normal.  Psychiatric:        Speech: Speech normal.        Behavior: Behavior normal.        Thought Content: Thought content normal.     ED Results / Procedures / Treatments   Labs (all labs ordered are listed, but only abnormal results are displayed) Labs Reviewed  URINE CULTURE - Abnormal; Notable for the following components:  Result Value   Culture   (*)    Value: <10,000 COLONIES/mL INSIGNIFICANT GROWTH Performed at [facility] [address] [place], [place] [address]    All other components within normal limits  URINALYSIS, ROUTINE W REFLEX MICROSCOPIC - Abnormal; Notable for the following components:   Color, Urine RED (*)    APPearance TURBID (*)    Glucose, UA   (*)    Value: TEST NOT REPORTED DUE TO COLOR INTERFERENCE OF URINE PIGMENT   Hgb urine  dipstick   (*)    Value: TEST NOT REPORTED DUE TO COLOR INTERFERENCE OF URINE PIGMENT   Bilirubin Urine   (*)    Value: TEST NOT REPORTED DUE TO COLOR INTERFERENCE OF URINE PIGMENT   Ketones, ur   (*)    Value: TEST NOT REPORTED DUE TO COLOR INTERFERENCE OF URINE PIGMENT   Protein, ur   (*)    Value: TEST NOT REPORTED DUE TO COLOR INTERFERENCE OF URINE PIGMENT   Nitrite   (*)    Value: TEST NOT REPORTED DUE TO COLOR INTERFERENCE OF URINE PIGMENT   Leukocytes,Ua   (*)    Value: TEST NOT REPORTED DUE TO COLOR INTERFERENCE OF URINE PIGMENT   All other components within normal limits  BASIC METABOLIC PANEL - Abnormal; Notable for the following components:   Glucose, Bld 116 (*)    BUN 32 (*)    Creatinine, Ser 1.35 (*)    Calcium 8.2 (*)    GFR calc non Af Amer 39 (*)    GFR calc Af Amer 45 (*)    All other components within normal limits  CBC - Abnormal; Notable for the following components:   WBC 13.9 (*)    RBC 2.87 (*)    Hemoglobin 8.9 (*)    HCT 29.8 (*)    MCV 103.8 (*)    MCHC 29.9 (*)    RDW 16.1 (*)    All other components within normal limits  URINALYSIS, MICROSCOPIC (REFLEX) - Abnormal; Notable for the following components:   Bacteria, UA RARE (*)    All other components within normal limits  CBG MONITORING, ED    EKG None  Radiology No results found.  Procedures Procedures (including critical care time)  Medications Ordered in ED Medications  iohexol (OMNIPAQUE) 300 MG/ML solution 100 mL (80 mLs Intravenous Contrast Given 09/29/19 0805)  iohexol (OMNIPAQUE) 9 MG/ML oral solution 500 mL (500 mLs Oral Contrast Given 09/29/19 0710)  sodium chloride 0.9 % bolus 500 mL (0 mLs Intravenous Stopped 09/29/19 0949)  heparin lock flush 100 unit/mL (500 Units Intracatheter Given 09/29/19 1032)    ED Course  I have reviewed the triage vital signs and the nursing notes.  Pertinent labs & imaging results that were available during my care of the patient were reviewed by  me and considered in my medical decision making (see chart for details).    MDM Rules/Calculators/A&P                          Patient presents to the emergency department for evaluation of hematuria.  Patient reports that she went to the bathroom tonight and noticed blood in the toilet after she urinated.  She does take Xarelto.  It appears that this is secondary to a DVT in 2011.  Patient was diagnosed with renal cell carcinoma around that time.  She had a right nephrectomy.  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  It appears that this recurrence has been refractory to treatment.  Urinalysis shows gross hematuria without clear infection.  Will perform CT scan to further evaluate.  She appears stable from a blood loss and anemia standpoint.  If no significant findings on CT scan, likely can hold Xarelto and follow-up with oncology/urology.  Will sign out to oncoming ER physician to follow-up CT scan.  Final Clinical Impression(s) / ED Diagnoses Final diagnoses:  Gross hematuria  Hematuria, unspecified type    Rx / DC Orders ED Discharge Orders    None       Jasher Barkan, Gwenyth Allegra, MD 10/09/19 [phone]

## 2019-09-29 NOTE — ED Triage Notes (Signed)
Patient has a history of kidney cancer, last treatment was in March. States she has seen blood in her urine twice now.

## 2019-09-29 NOTE — Discharge Instructions (Signed)
Please return for any problem.  Follow-up with your regular care provider as instructed.  Return specifically for increased bleeding, fever, pain, or other emergency.

## 2019-09-29 NOTE — ED Provider Notes (Signed)
Patient seen after prior ED provider.  Patient reports that her brief hematuria noted earlier today is cleared.  She is urinated twice while in the ED without evidence of blood grossly.  She desires discharge.  She is made aware of lab and CT findings from today.  She does understand the need for close follow-up with both her primary care and with oncology.  Strict return precautions given and understood.     Valarie Merino, MD 09/29/19 [phone]

## 2019-09-29 NOTE — Telephone Encounter (Signed)
Patient notified and calendar printed/ mailed per 7/9 los

## 2019-09-30 LAB — URINE CULTURE: Culture: <10000 — AB

## 2019-10-02 ENCOUNTER — Other Ambulatory Visit: Payer: Self-pay | Admitting: *Deleted

## 2019-10-05 ENCOUNTER — Other Ambulatory Visit: Payer: Self-pay

## 2019-10-05 ENCOUNTER — Emergency Department (HOSPITAL_COMMUNITY)
Admission: EM | Admit: 2019-10-05 | Discharge: 2019-10-05 | Disposition: A | Discharge status: Home / Self Care | Payer: Medicare Other | Attending: Emergency Medicine | Admitting: Emergency Medicine

## 2019-10-05 DIAGNOSIS — Z87891 Personal history of nicotine dependence: Secondary | ICD-10-CM | POA: Insufficient documentation

## 2019-10-05 DIAGNOSIS — K219 Gastro-esophageal reflux disease without esophagitis: Secondary | ICD-10-CM | POA: Diagnosis not present

## 2019-10-05 DIAGNOSIS — Z7951 Long term (current) use of inhaled steroids: Secondary | ICD-10-CM | POA: Insufficient documentation

## 2019-10-05 DIAGNOSIS — I129 Hypertensive chronic kidney disease with stage 1 through stage 4 chronic kidney disease, or unspecified chronic kidney disease: Secondary | ICD-10-CM | POA: Insufficient documentation

## 2019-10-05 DIAGNOSIS — E119 Type 2 diabetes mellitus without complications: Secondary | ICD-10-CM | POA: Insufficient documentation

## 2019-10-05 DIAGNOSIS — N182 Chronic kidney disease, stage 2 (mild): Secondary | ICD-10-CM | POA: Insufficient documentation

## 2019-10-05 DIAGNOSIS — R319 Hematuria, unspecified: Secondary | ICD-10-CM | POA: Diagnosis present

## 2019-10-05 DIAGNOSIS — Z85528 Personal history of other malignant neoplasm of kidney: Secondary | ICD-10-CM | POA: Diagnosis not present

## 2019-10-05 DIAGNOSIS — Z85118 Personal history of other malignant neoplasm of bronchus and lung: Secondary | ICD-10-CM | POA: Diagnosis not present

## 2019-10-05 DIAGNOSIS — Z7901 Long term (current) use of anticoagulants: Secondary | ICD-10-CM | POA: Diagnosis not present

## 2019-10-05 DIAGNOSIS — J441 Chronic obstructive pulmonary disease with (acute) exacerbation: Secondary | ICD-10-CM | POA: Diagnosis not present

## 2019-10-05 DIAGNOSIS — R310 Gross hematuria: Secondary | ICD-10-CM

## 2019-10-05 DIAGNOSIS — C642 Malignant neoplasm of left kidney, except renal pelvis: Secondary | ICD-10-CM

## 2019-10-05 LAB — COMPREHENSIVE METABOLIC PANEL
ALT: 26 U/L (ref 0–44)
AST: 32 U/L (ref 15–41)
Albumin: 3.9 g/dL (ref 3.5–5.0)
Alkaline Phosphatase: 74 U/L (ref 38–126)
Anion gap: 11 (ref 5–15)
BUN: 24 mg/dL — ABNORMAL HIGH (ref 8–23)
CO2: 23 mmol/L (ref 22–32)
Calcium: 9.4 mg/dL (ref 8.9–10.3)
Chloride: 108 mmol/L (ref 98–111)
Creatinine, Ser: 1.32 mg/dL — ABNORMAL HIGH (ref 0.44–1.00)
GFR calc Af Amer: 47 mL/min — ABNORMAL LOW (ref >60)
GFR calc non Af Amer: 40 mL/min — ABNORMAL LOW (ref >60)
Glucose, Bld: 144 mg/dL — ABNORMAL HIGH (ref 70–99)
Potassium: 5 mmol/L (ref 3.5–5.1)
Sodium: 142 mmol/L (ref 135–145)
Total Bilirubin: 0.6 mg/dL (ref 0.3–1.2)
Total Protein: 7.6 g/dL (ref 6.5–8.1)

## 2019-10-05 LAB — URINALYSIS, ROUTINE W REFLEX MICROSCOPIC

## 2019-10-05 LAB — URINALYSIS, MICROSCOPIC (REFLEX): RBC / HPF: >50 RBC/hpf (ref 0–5)

## 2019-10-05 LAB — CBC WITH DIFFERENTIAL/PLATELET
Abs Immature Granulocytes: 0.15 10*3/uL — ABNORMAL HIGH (ref 0.00–0.07)
Basophils Absolute: 0 10*3/uL (ref 0.0–0.1)
Basophils Relative: 0 %
Eosinophils Absolute: 0 10*3/uL (ref 0.0–0.5)
Eosinophils Relative: 0 %
HCT: 33.7 % — ABNORMAL LOW (ref 36.0–46.0)
Hemoglobin: 9.7 g/dL — ABNORMAL LOW (ref 12.0–15.0)
Immature Granulocytes: 1 %
Lymphocytes Relative: 9 %
Lymphs Abs: 1.1 10*3/uL (ref 0.7–4.0)
MCH: 30 pg (ref 26.0–34.0)
MCHC: 28.8 g/dL — ABNORMAL LOW (ref 30.0–36.0)
MCV: 104.3 fL — ABNORMAL HIGH (ref 80.0–100.0)
Monocytes Absolute: 0.3 10*3/uL (ref 0.1–1.0)
Monocytes Relative: 3 %
Neutro Abs: 10 10*3/uL — ABNORMAL HIGH (ref 1.7–7.7)
Neutrophils Relative %: 87 %
Platelets: 304 10*3/uL (ref 150–400)
RBC: 3.23 MIL/uL — ABNORMAL LOW (ref 3.87–5.11)
RDW: 16.3 % — ABNORMAL HIGH (ref 11.5–15.5)
WBC: 11.5 10*3/uL — ABNORMAL HIGH (ref 4.0–10.5)
nRBC: 0.2 % (ref 0.0–0.2)

## 2019-10-05 MED ORDER — SODIUM CHLORIDE 0.9 % IV BOLUS
1000.0000 mL | Freq: Once | INTRAVENOUS | Status: AC
  Administered 2019-10-05: 1000 mL via INTRAVENOUS

## 2019-10-05 NOTE — Discharge Instructions (Signed)
You have renal cell cancer and that is why you have blood in your urine. You are likely going to continue to have a blood in your urine.  You need to call your oncologist and urologist for follow-up this week.  Please stay hydrated  Return to ER if you have large clots in your urine, severe flank pain, vomiting, fever, unable to urinate.

## 2019-10-05 NOTE — ED Provider Notes (Signed)
[facility] DEPT Provider Note   CSN: [id number] Arrival date & time: 10/05/19  1643     History Chief Complaint  Patient presents with  . Hematuria    Melanie Best is a 74 y.o. female history of renal cell cancer s/p R nephrectomy, DVT not on xarelto, presenting with hematuria.  Had an episode of hematuria about a week ago.  Patient came to the ED and had a CT that showed renal cell cancer with enlarging renal vein tumor and thrombus approaches the IVC.  She states that her hematuria resolved initially.  She states that she has gross hematuria today.  Denies any flank pain or vomiting or dysuria. Denies any fevers or chills.  Patient is on oxygen at baseline for COPD.  The history is provided by the patient.       Past Medical History:  Diagnosis Date  . Anemia   . Anxiety   . Arthritis    R leg  . Cancer ([place])    kidney  . Chronic fatigue 12/13/2016  . CKD (chronic kidney disease), stage II    removed R kidney- 2010- for Cancer, followed by Dr. Diona Fanti  . COPD (chronic obstructive pulmonary disease) (HCC)    uses O2-2 liters , continuously , followed by Dr. Chase Caller  . Cough 08-02-2015  . Diabetes mellitus, type 2 ([place])   . DM (diabetes mellitus) ([place]) 01/27/2012  . DVT (deep venous thrombosis), left    2011- treated /w coumadin   . Dyslipidemia   . Dysrhythmia   . Esophageal reflux   . GERD (gastroesophageal reflux disease) 01/27/2012  . Glaucoma   . Goals of care, counseling/discussion 12/13/2016  . Hemoptysis 08-02-15  . History of radiation therapy 01/09/12,01/11/12,01/15/12,01/17/12,& 01/19/12   rul lung,50Gy/40fx  . HTN (hypertension) 01/27/2012  . Hyperlipidemia 01/27/2012  . Hypertension    sees Dr. Deforest Hoyles for PCP, denies ever having stress or ECHO, ?when & where she had  last  EKG  . Leukocytosis   . Lung cancer ([place])    NSCLC  . Osteopenia   . Renal cell carcinoma of right kidney ([place]) 12/13/2016  . Shortness of breath     . Syncope   . Tremor   . Urinary incontinence   . Vitamin D deficiency     Patient Active Problem List   Diagnosis Date Noted  . Insomnia 11/26/2018  . Decreased appetite 11/26/2018  . Hypokalemia 02/11/2018  . Encounter for antineoplastic chemotherapy 07/25/2017  . Port-A-Cath in place 04/05/2017  . Chronic hypoxemic respiratory failure ([place]) 01/15/2017  . Renal cell carcinoma of right kidney ([place]) 12/13/2016  . Chronic fatigue 12/13/2016  . Goals of care, counseling/discussion 12/13/2016  . Encounter for antineoplastic immunotherapy 12/13/2016  . Malignant neoplasm of left lung ([place]) 01/13/2015  . Renal insufficiency 06/30/2014  . Renal cell carcinoma ([place]) 06/30/2014  . COPD, severe ([place]) 09/13/2013  . Malignant neoplasm of lower lobe of left lung ([place]) 09/09/2013  . Malignant neoplasm of upper lobe of right lung ([place]) 09/09/2013  . Lung nodule, left upper lobe and left lower lobe 03/16/2012  . COPD exacerbation ([place]) 03/16/2012  . Chest pain 01/27/2012  . Leukocytosis 01/27/2012  . DM (diabetes mellitus) ([place]) 01/27/2012  . Hyperlipidemia 01/27/2012  . CKD (chronic kidney disease), stage II 01/27/2012  . HTN (hypertension) 01/27/2012  . Anxiety 01/27/2012  . GERD (gastroesophageal reflux disease) 01/27/2012  . RUL NSCLC Prsumed STage 1A 10/13/2011  . COPD, very severe ([place]) 08/05/2011  Past Surgical History:  Procedure Laterality Date  . BACK SURGERY     for pinched nerve, 2011, here at [facility]  . EYE SURGERY     cataracts removed, ?IOL  . FLEXIBLE BRONCHOSCOPY  11/29/2011  . IR FLUORO GUIDE PORT INSERTION RIGHT  01/30/2017  . IR US GUIDE VASC ACCESS RIGHT  01/30/2017  . removed blood clot     left upper abd quad   . right kidney removed    . right nephrectomy       OB History   No obstetric history on file.     Family History  Problem Relation Age of Onset  . Emphysema Father   . Cancer Sister        behind eye  . Cancer Brother        lung  .  Cancer Brother        lung  . Cancer Brother        throat  . Breast cancer Neg Hx     Social History   Tobacco Use  . Smoking status: Former Smoker    Packs/day: 1.00    Years: 35.00    Pack years: 35.00    Types: Cigarettes    Quit date: 03/20/2004    Years since quitting: 15.5  . Smokeless tobacco: Never Used  Vaping Use  . Vaping Use: Never used  Substance Use Topics  . Alcohol use: No  . Drug use: No    Home Medications Prior to Admission medications   Medication Sig Start Date End Date Taking? Authorizing Provider  alendronate (FOSAMAX) 70 MG tablet Take 70 mg by mouth once a week. 03/08/19   [provider]  brinzolamide (AZOPT) 1 % ophthalmic suspension Place 1 drop into both eyes 3 (three) times daily. Reported on 08/12/2015    [provider]  clonazePAM (KLONOPIN) 1 MG tablet Take 1 mg by mouth 2 (two) times daily as needed. For anxiety    [provider]  diltiazem (CARDIZEM CD) 180 MG 24 hr capsule Take 180 mg by mouth daily. 04/14/14   [provider]  ergocalciferol (VITAMIN D2) 50000 UNITS capsule Take 50,000 Units by mouth 2 (two) times a week. Take on Monday & thursday    [provider]  esomeprazole (NEXIUM) 40 MG capsule Take 40 mg by mouth daily. 04/14/19   [provider]  furosemide (LASIX) 20 MG tablet Take 20 mg by mouth daily as needed. For fluid retention    [provider]  hydrocortisone cream 1 % Apply 1 application topically 2 (two) times daily. Patient not taking: Reported on 09/29/2019 10/16/17   Curt Bears, MD  ipratropium-albuterol (DUONEB) 0.5-2.5 (3) MG/3ML SOLN Take 3 mLs by nebulization 4 (four) times daily as needed. Patient taking differently: Take 3 mLs by nebulization 4 (four) times daily as needed (SOB/Wheezing).  01/13/15   Brand Males, MD  lidocaine-prilocaine (EMLA) cream Apply 1 application topically as needed (port access).     [provider]    lovastatin (MEVACOR) 20 MG tablet Take 20 mg by mouth daily.  08/04/18   [provider]  LUMIGAN 0.01 % SOLN Place 1 drop into both eyes daily.  07/26/18   [provider]  magic mouthwash w/lidocaine SOLN Take 5 mLs by mouth 4 (four) times daily as needed for mouth pain. Swish and spit before meals and at bedtime. Patient not taking: Reported on 05/13/2019 02/11/18   Maryanna Shape, NP  mirtazapine (REMERON) 7.5  MG tablet Take 1 tablet (7.5 mg total) by mouth daily. 06/12/19   Heilingoetter, Cassandra L, PA-C  mupirocin ointment (BACTROBAN) 2 % Place 1 application into the nose 2 (two) times daily. Patient not taking: Reported on 09/29/2019 11/22/17   Sandi Mealy E., PA-C  Nebulizers (VIOS AEROSOL DELIVERY SYSTEM) MISC USE UTD BY MD WITH NEBULIZER SOLUTION 04/05/18   [provider]  OXYGEN Inhale 2 L into the lungs continuous.    [provider]  predniSONE (DELTASONE) 10 MG tablet Take 10 mg by mouth daily with breakfast.     [provider]  XARELTO 20 MG TABS tablet TAKE 1 TABLET BY MOUTH  DAILY WITH SUPPER Patient taking differently: Take 20 mg by mouth daily with supper.  09/16/19   Heilingoetter, Cassandra L, PA-C  CABOMETYX 40 MG tablet TAKE 1 TABLET (40MG ) BY MOUTH ONCE DAILY. TAKE ON AN EMPTY STOMACH 1 HOUR BEFORE OR 2 HOURS AFTER MEALS 01/09/19   Heilingoetter, Cassandra L, PA-C    Allergies    Patient has no known allergies.  Review of Systems   Review of Systems  Genitourinary: Positive for hematuria.  All other systems reviewed and are negative.   Physical Exam Updated Vital Signs BP 126/71   Pulse 88   Temp 98.8 F (37.1 C) (Oral)   Resp 16   Ht 5\' 6"  (1.676 m)   Wt 53.5 kg   SpO2 100%   BMI 19.05 kg/m   Physical Exam Vitals and nursing note reviewed.  HENT:     Head: Normocephalic.     Nose: Nose normal.     Mouth/Throat:     Mouth: Mucous membranes are moist.  Eyes:     Extraocular Movements: Extraocular movements  intact.     Pupils: Pupils are equal, round, and reactive to light.  Cardiovascular:     Rate and Rhythm: Normal rate and regular rhythm.     Pulses: Normal pulses.     Heart sounds: Normal heart sounds.  Pulmonary:     Effort: Pulmonary effort is normal.     Breath sounds: Normal breath sounds.  Abdominal:     General: Abdomen is flat.     Palpations: Abdomen is soft.  Musculoskeletal:        General: Normal range of motion.     Cervical back: Normal range of motion and neck supple.  Skin:    General: Skin is warm.     Capillary Refill: Capillary refill takes less than 2 seconds.  Neurological:     General: No focal deficit present.     Mental Status: She is alert and oriented to person, place, and time.  Psychiatric:        Mood and Affect: Mood normal.        Behavior: Behavior normal.     ED Results / Procedures / Treatments   Labs (all labs ordered are listed, but only abnormal results are displayed) Labs Reviewed  URINALYSIS, ROUTINE W REFLEX MICROSCOPIC - Abnormal; Notable for the following components:      Result Value   Color, Urine RED (*)    APPearance TURBID (*)    Glucose, UA   (*)    Value: TEST NOT REPORTED DUE TO COLOR INTERFERENCE OF URINE PIGMENT   Hgb urine dipstick   (*)    Value: TEST NOT REPORTED DUE TO COLOR INTERFERENCE OF URINE PIGMENT   Bilirubin Urine   (*)    Value: TEST NOT REPORTED DUE TO COLOR  INTERFERENCE OF URINE PIGMENT   Ketones, ur   (*)    Value: TEST NOT REPORTED DUE TO COLOR INTERFERENCE OF URINE PIGMENT   Protein, ur   (*)    Value: TEST NOT REPORTED DUE TO COLOR INTERFERENCE OF URINE PIGMENT   Nitrite   (*)    Value: TEST NOT REPORTED DUE TO COLOR INTERFERENCE OF URINE PIGMENT   Leukocytes,Ua   (*)    Value: TEST NOT REPORTED DUE TO COLOR INTERFERENCE OF URINE PIGMENT   All other components within normal limits  CBC WITH DIFFERENTIAL/PLATELET - Abnormal; Notable for the following components:   WBC 11.5 (*)    RBC 3.23 (*)      Hemoglobin 9.7 (*)    HCT 33.7 (*)    MCV 104.3 (*)    MCHC 28.8 (*)    RDW 16.3 (*)    Neutro Abs 10.0 (*)    Abs Immature Granulocytes 0.15 (*)    All other components within normal limits  COMPREHENSIVE METABOLIC PANEL - Abnormal; Notable for the following components:   Glucose, Bld 144 (*)    BUN 24 (*)    Creatinine, Ser 1.32 (*)    GFR calc non Af Amer 40 (*)    GFR calc Af Amer 47 (*)    All other components within normal limits  URINALYSIS, MICROSCOPIC (REFLEX) - Abnormal; Notable for the following components:   Bacteria, UA FEW (*)    All other components within normal limits  URINE CULTURE    EKG None  Radiology No results found.  Procedures Procedures (including critical care time)  Medications Ordered in ED Medications  sodium chloride 0.9 % bolus 1,000 mL (1,000 mLs Intravenous New Bag/Given (Non-Interop) 10/05/19 2139)    ED Course  I have reviewed the triage vital signs and the nursing notes.  Pertinent labs & imaging results that were available during my care of the patient were reviewed by me and considered in my medical decision making (see chart for details).    MDM Rules/Calculators/A&P                          KABELLA CASSIDY is a 74 y.o. female here presenting with hematuria.  Reviewed records from recent ED visit.  Patient was diagnosed with renal cell cancer recurrence.  Patient has no dysuria and just painless hematuria.  I think given her recent CT, I think her hematuria is likely from her renal cell cancer. Will check CBC and chemistry.  We will also check UA and urine culture.  Will hydrate and reassess.  11:16 PM CBC and CMP stable.  I discussed case with Dr. Alyson Ingles from urology.  He reviewed imaging from recent ED visit.  He states that patient does have renal cell cancer with a thrombus and is likely bleeding from that.  He states that as long as patient is not in urinary retention, patient can be hydrated and follow-up with oncology  and urology. Patient's urine showed gross hematuria and no obvious bacteria. Patient is hydrated in the ED. Stable for discharge. I told her to call urology and oncology this week for follow-up. She knows to return if she has fever or any infectious symptoms or she is unable to urinate.  Final Clinical Impression(s) / ED Diagnoses Final diagnoses:  None    Rx / DC Orders ED Discharge Orders    None       Melanie Freeze, MD 10/05/19  2317  

## 2019-10-05 NOTE — ED Triage Notes (Signed)
Patient reports blood in urine starting yesterday. Denies pain. Patient says this happened a week ago and CT was done but no cause found.

## 2019-10-07 LAB — URINE CULTURE: Culture: NO GROWTH

## 2019-10-16 ENCOUNTER — Encounter: Payer: Self-pay | Admitting: *Deleted

## 2019-10-16 NOTE — Progress Notes (Signed)
New Patient office note from 09/26/2019 faxed to [facility]. Fax # is [phone]. Office # is [phone].

## 2019-10-21 ENCOUNTER — Emergency Department (HOSPITAL_COMMUNITY)
Admission: EM | Admit: 2019-10-21 | Discharge: 2019-10-21 | Disposition: A | Discharge status: Home / Self Care | Payer: Medicare Other | Source: Home / Self Care

## 2019-10-21 ENCOUNTER — Encounter (HOSPITAL_COMMUNITY): Payer: Self-pay

## 2019-10-21 ENCOUNTER — Other Ambulatory Visit: Payer: Self-pay

## 2019-10-21 DIAGNOSIS — Z5321 Procedure and treatment not carried out due to patient leaving prior to being seen by health care provider: Secondary | ICD-10-CM | POA: Insufficient documentation

## 2019-10-21 DIAGNOSIS — R319 Hematuria, unspecified: Secondary | ICD-10-CM | POA: Insufficient documentation

## 2019-10-21 DIAGNOSIS — R339 Retention of urine, unspecified: Secondary | ICD-10-CM | POA: Insufficient documentation

## 2019-10-21 NOTE — ED Triage Notes (Signed)
Patient states that she has difficulty urinating when sitting straight up, but easier to urinate when laying on her side. Patient also reports hematuria and frequency x 2 days.

## 2019-10-22 ENCOUNTER — Inpatient Hospital Stay (HOSPITAL_BASED_OUTPATIENT_CLINIC_OR_DEPARTMENT_OTHER): Payer: Medicare Other | Admitting: Hematology & Oncology

## 2019-10-22 ENCOUNTER — Other Ambulatory Visit: Payer: Self-pay

## 2019-10-22 ENCOUNTER — Inpatient Hospital Stay (HOSPITAL_COMMUNITY)
Admission: EM | Admit: 2019-10-22 | Discharge: 2019-10-25 | DRG: 812 | Disposition: A | Discharge status: Hospice | Payer: Medicare Other | Attending: Internal Medicine | Admitting: Internal Medicine

## 2019-10-22 ENCOUNTER — Encounter (HOSPITAL_COMMUNITY): Payer: Self-pay | Admitting: Emergency Medicine

## 2019-10-22 ENCOUNTER — Inpatient Hospital Stay: Payer: Medicare Other | Attending: Internal Medicine

## 2019-10-22 ENCOUNTER — Encounter: Payer: Self-pay | Admitting: Hematology & Oncology

## 2019-10-22 VITALS — BP 85/62 | HR 110 | Temp 98.3°F | Resp 20 | Ht 65.0 in

## 2019-10-22 DIAGNOSIS — D649 Anemia, unspecified: Secondary | ICD-10-CM

## 2019-10-22 DIAGNOSIS — Z79899 Other long term (current) drug therapy: Secondary | ICD-10-CM

## 2019-10-22 DIAGNOSIS — E119 Type 2 diabetes mellitus without complications: Secondary | ICD-10-CM | POA: Diagnosis not present

## 2019-10-22 DIAGNOSIS — Z515 Encounter for palliative care: Secondary | ICD-10-CM | POA: Diagnosis not present

## 2019-10-22 DIAGNOSIS — G4700 Insomnia, unspecified: Secondary | ICD-10-CM | POA: Diagnosis present

## 2019-10-22 DIAGNOSIS — R531 Weakness: Secondary | ICD-10-CM

## 2019-10-22 DIAGNOSIS — Z66 Do not resuscitate: Secondary | ICD-10-CM | POA: Diagnosis not present

## 2019-10-22 DIAGNOSIS — I129 Hypertensive chronic kidney disease with stage 1 through stage 4 chronic kidney disease, or unspecified chronic kidney disease: Secondary | ICD-10-CM | POA: Diagnosis present

## 2019-10-22 DIAGNOSIS — D62 Acute posthemorrhagic anemia: Secondary | ICD-10-CM | POA: Diagnosis present

## 2019-10-22 DIAGNOSIS — I82492 Acute embolism and thrombosis of other specified deep vein of left lower extremity: Secondary | ICD-10-CM | POA: Diagnosis not present

## 2019-10-22 DIAGNOSIS — Z681 Body mass index (BMI) 19 or less, adult: Secondary | ICD-10-CM | POA: Diagnosis not present

## 2019-10-22 DIAGNOSIS — Z86718 Personal history of other venous thrombosis and embolism: Secondary | ICD-10-CM

## 2019-10-22 DIAGNOSIS — Z905 Acquired absence of kidney: Secondary | ICD-10-CM

## 2019-10-22 DIAGNOSIS — Z87891 Personal history of nicotine dependence: Secondary | ICD-10-CM

## 2019-10-22 DIAGNOSIS — C641 Malignant neoplasm of right kidney, except renal pelvis: Secondary | ICD-10-CM | POA: Diagnosis present

## 2019-10-22 DIAGNOSIS — J449 Chronic obstructive pulmonary disease, unspecified: Secondary | ICD-10-CM | POA: Diagnosis present

## 2019-10-22 DIAGNOSIS — C3432 Malignant neoplasm of lower lobe, left bronchus or lung: Secondary | ICD-10-CM | POA: Diagnosis present

## 2019-10-22 DIAGNOSIS — N939 Abnormal uterine and vaginal bleeding, unspecified: Secondary | ICD-10-CM | POA: Diagnosis present

## 2019-10-22 DIAGNOSIS — E1122 Type 2 diabetes mellitus with diabetic chronic kidney disease: Secondary | ICD-10-CM | POA: Diagnosis present

## 2019-10-22 DIAGNOSIS — I82412 Acute embolism and thrombosis of left femoral vein: Secondary | ICD-10-CM | POA: Diagnosis present

## 2019-10-22 DIAGNOSIS — N1831 Chronic kidney disease, stage 3a: Secondary | ICD-10-CM | POA: Diagnosis present

## 2019-10-22 DIAGNOSIS — Z923 Personal history of irradiation: Secondary | ICD-10-CM

## 2019-10-22 DIAGNOSIS — R634 Abnormal weight loss: Secondary | ICD-10-CM

## 2019-10-22 DIAGNOSIS — Z9981 Dependence on supplemental oxygen: Secondary | ICD-10-CM

## 2019-10-22 DIAGNOSIS — C649 Malignant neoplasm of unspecified kidney, except renal pelvis: Secondary | ICD-10-CM

## 2019-10-22 DIAGNOSIS — K219 Gastro-esophageal reflux disease without esophagitis: Secondary | ICD-10-CM | POA: Diagnosis present

## 2019-10-22 DIAGNOSIS — R627 Adult failure to thrive: Secondary | ICD-10-CM | POA: Diagnosis present

## 2019-10-22 DIAGNOSIS — F419 Anxiety disorder, unspecified: Secondary | ICD-10-CM | POA: Diagnosis present

## 2019-10-22 DIAGNOSIS — B370 Candidal stomatitis: Secondary | ICD-10-CM | POA: Diagnosis present

## 2019-10-22 DIAGNOSIS — J9611 Chronic respiratory failure with hypoxia: Secondary | ICD-10-CM | POA: Diagnosis present

## 2019-10-22 DIAGNOSIS — Z85118 Personal history of other malignant neoplasm of bronchus and lung: Secondary | ICD-10-CM

## 2019-10-22 DIAGNOSIS — C3411 Malignant neoplasm of upper lobe, right bronchus or lung: Secondary | ICD-10-CM | POA: Diagnosis present

## 2019-10-22 DIAGNOSIS — R310 Gross hematuria: Secondary | ICD-10-CM | POA: Diagnosis present

## 2019-10-22 DIAGNOSIS — R636 Underweight: Secondary | ICD-10-CM | POA: Diagnosis present

## 2019-10-22 DIAGNOSIS — M8580 Other specified disorders of bone density and structure, unspecified site: Secondary | ICD-10-CM | POA: Diagnosis present

## 2019-10-22 DIAGNOSIS — Z7189 Other specified counseling: Secondary | ICD-10-CM | POA: Diagnosis not present

## 2019-10-22 DIAGNOSIS — Z7901 Long term (current) use of anticoagulants: Secondary | ICD-10-CM

## 2019-10-22 DIAGNOSIS — C642 Malignant neoplasm of left kidney, except renal pelvis: Secondary | ICD-10-CM | POA: Diagnosis present

## 2019-10-22 DIAGNOSIS — Z95828 Presence of other vascular implants and grafts: Secondary | ICD-10-CM

## 2019-10-22 DIAGNOSIS — Z7952 Long term (current) use of systemic steroids: Secondary | ICD-10-CM

## 2019-10-22 DIAGNOSIS — I10 Essential (primary) hypertension: Secondary | ICD-10-CM | POA: Diagnosis present

## 2019-10-22 DIAGNOSIS — Z20822 Contact with and (suspected) exposure to covid-19: Secondary | ICD-10-CM | POA: Diagnosis present

## 2019-10-22 DIAGNOSIS — R319 Hematuria, unspecified: Secondary | ICD-10-CM

## 2019-10-22 DIAGNOSIS — N1832 Chronic kidney disease, stage 3b: Secondary | ICD-10-CM | POA: Diagnosis not present

## 2019-10-22 DIAGNOSIS — N182 Chronic kidney disease, stage 2 (mild): Secondary | ICD-10-CM | POA: Diagnosis not present

## 2019-10-22 DIAGNOSIS — E785 Hyperlipidemia, unspecified: Secondary | ICD-10-CM

## 2019-10-22 DIAGNOSIS — H409 Unspecified glaucoma: Secondary | ICD-10-CM | POA: Diagnosis present

## 2019-10-22 DIAGNOSIS — M1909 Primary osteoarthritis, other specified site: Secondary | ICD-10-CM | POA: Diagnosis present

## 2019-10-22 LAB — CMP (CANCER CENTER ONLY)
ALT: 61 U/L — ABNORMAL HIGH (ref 0–44)
AST: 47 U/L — ABNORMAL HIGH (ref 15–41)
Albumin: 3.6 g/dL (ref 3.5–5.0)
Alkaline Phosphatase: 130 U/L — ABNORMAL HIGH (ref 38–126)
Anion gap: 11 (ref 5–15)
BUN: 33 mg/dL — ABNORMAL HIGH (ref 8–23)
CO2: 22 mmol/L (ref 22–32)
Calcium: 8.8 mg/dL — ABNORMAL LOW (ref 8.9–10.3)
Chloride: 107 mmol/L (ref 98–111)
Creatinine: 1.58 mg/dL — ABNORMAL HIGH (ref 0.44–1.00)
GFR, Est AFR Am: 37 mL/min — ABNORMAL LOW (ref >60)
GFR, Estimated: 32 mL/min — ABNORMAL LOW (ref >60)
Glucose, Bld: 104 mg/dL — ABNORMAL HIGH (ref 70–99)
Potassium: 3.6 mmol/L (ref 3.5–5.1)
Sodium: 140 mmol/L (ref 135–145)
Total Bilirubin: 0.5 mg/dL (ref 0.3–1.2)
Total Protein: 6.1 g/dL — ABNORMAL LOW (ref 6.5–8.1)

## 2019-10-22 LAB — URINALYSIS, ROUTINE W REFLEX MICROSCOPIC
Bacteria, UA: NONE SEEN
RBC / HPF: >50 RBC/hpf — ABNORMAL HIGH (ref 0–5)

## 2019-10-22 LAB — CBC WITH DIFFERENTIAL (CANCER CENTER ONLY)
Abs Immature Granulocytes: 0.11 10*3/uL — ABNORMAL HIGH (ref 0.00–0.07)
Basophils Absolute: 0 10*3/uL (ref 0.0–0.1)
Basophils Relative: 0 %
Eosinophils Absolute: 0 10*3/uL (ref 0.0–0.5)
Eosinophils Relative: 0 %
HCT: 22.2 % — ABNORMAL LOW (ref 36.0–46.0)
Hemoglobin: 6.3 g/dL — CL (ref 12.0–15.0)
Immature Granulocytes: 1 %
Lymphocytes Relative: 7 %
Lymphs Abs: 0.9 10*3/uL (ref 0.7–4.0)
MCH: 29.2 pg (ref 26.0–34.0)
MCHC: 28.4 g/dL — ABNORMAL LOW (ref 30.0–36.0)
MCV: 102.8 fL — ABNORMAL HIGH (ref 80.0–100.0)
Monocytes Absolute: 0.6 10*3/uL (ref 0.1–1.0)
Monocytes Relative: 4 %
Neutro Abs: 12 10*3/uL — ABNORMAL HIGH (ref 1.7–7.7)
Neutrophils Relative %: 88 %
Platelet Count: 146 10*3/uL — ABNORMAL LOW (ref 150–400)
RBC: 2.16 MIL/uL — ABNORMAL LOW (ref 3.87–5.11)
RDW: 16.5 % — ABNORMAL HIGH (ref 11.5–15.5)
WBC Count: 13.6 10*3/uL — ABNORMAL HIGH (ref 4.0–10.5)
nRBC: 0.2 % (ref 0.0–0.2)

## 2019-10-22 LAB — HEMOGLOBIN AND HEMATOCRIT, BLOOD
HCT: 21 % — ABNORMAL LOW (ref 36.0–46.0)
Hemoglobin: 6 g/dL — CL (ref 12.0–15.0)

## 2019-10-22 LAB — SARS CORONAVIRUS 2 BY RT PCR (HOSPITAL ORDER, PERFORMED IN ~~LOC~~ HOSPITAL LAB): SARS Coronavirus 2: NEGATIVE

## 2019-10-22 LAB — PREPARE RBC (CROSSMATCH)

## 2019-10-22 LAB — GLUCOSE, CAPILLARY: Glucose-Capillary: 201 mg/dL — ABNORMAL HIGH (ref 70–99)

## 2019-10-22 MED ORDER — PRAVASTATIN SODIUM 20 MG PO TABS
20.0000 mg | ORAL_TABLET | Freq: Every day | ORAL | Status: DC
  Administered 2019-10-23: 20 mg via ORAL
  Filled 2019-10-22: qty 1

## 2019-10-22 MED ORDER — IPRATROPIUM-ALBUTEROL 0.5-2.5 (3) MG/3ML IN SOLN
3.0000 mL | Freq: Four times a day (QID) | RESPIRATORY_TRACT | Status: DC | PRN
  Filled 2019-10-22: qty 3

## 2019-10-22 MED ORDER — ACETAMINOPHEN 650 MG RE SUPP: 650.0000 mg | Freq: Four times a day (QID) | RECTAL | Status: DC | PRN

## 2019-10-22 MED ORDER — ONDANSETRON HCL 4 MG PO TABS
4.0000 mg | ORAL_TABLET | Freq: Four times a day (QID) | ORAL | Status: DC | PRN
  Filled 2019-10-22: qty 1

## 2019-10-22 MED ORDER — ACETAMINOPHEN 325 MG PO TABS: 650.0000 mg | ORAL_TABLET | Freq: Four times a day (QID) | ORAL | Status: DC | PRN

## 2019-10-22 MED ORDER — INSULIN ASPART 100 UNIT/ML ~~LOC~~ SOLN
0.0000 [IU] | Freq: Three times a day (TID) | SUBCUTANEOUS | Status: DC
  Administered 2019-10-23: 1 [IU] via SUBCUTANEOUS
  Administered 2019-10-24: 2 [IU] via SUBCUTANEOUS
  Filled 2019-10-22: qty 0.06

## 2019-10-22 MED ORDER — RIVAROXABAN 20 MG PO TABS: 20.0000 mg | ORAL_TABLET | Freq: Every day | ORAL | Status: DC

## 2019-10-22 MED ORDER — DILTIAZEM HCL ER COATED BEADS 180 MG PO CP24
180.0000 mg | ORAL_CAPSULE | Freq: Every day | ORAL | Status: DC
  Administered 2019-10-23 – 2019-10-25 (×3): 180 mg via ORAL
  Filled 2019-10-22 (×3): qty 1

## 2019-10-22 MED ORDER — SODIUM CHLORIDE 0.9% IV SOLUTION: Freq: Once | INTRAVENOUS | Status: AC

## 2019-10-22 MED ORDER — ENSURE ENLIVE PO LIQD
237.0000 mL | Freq: Two times a day (BID) | ORAL | Status: DC
  Administered 2019-10-23: 237 mL via ORAL

## 2019-10-22 MED ORDER — BRINZOLAMIDE 1 % OP SUSP
1.0000 [drp] | Freq: Three times a day (TID) | OPHTHALMIC | Status: DC
  Administered 2019-10-23 – 2019-10-25 (×6): 1 [drp] via OPHTHALMIC
  Filled 2019-10-22: qty 10

## 2019-10-22 MED ORDER — MIRTAZAPINE 15 MG PO TABS
7.5000 mg | ORAL_TABLET | Freq: Every day | ORAL | Status: DC
  Administered 2019-10-22 – 2019-10-25 (×4): 7.5 mg via ORAL
  Filled 2019-10-22 (×6): qty 1

## 2019-10-22 MED ORDER — LATANOPROST 0.005 % OP SOLN
1.0000 [drp] | Freq: Every day | OPHTHALMIC | Status: DC
  Administered 2019-10-22 – 2019-10-24 (×3): 1 [drp] via OPHTHALMIC
  Filled 2019-10-22: qty 2.5

## 2019-10-22 MED ORDER — TRAZODONE HCL 50 MG PO TABS: 50.0000 mg | ORAL_TABLET | Freq: Every evening | ORAL | Status: DC | PRN

## 2019-10-22 MED ORDER — PREDNISONE 5 MG PO TABS
10.0000 mg | ORAL_TABLET | Freq: Every day | ORAL | Status: DC
  Administered 2019-10-23 – 2019-10-25 (×3): 10 mg via ORAL
  Filled 2019-10-22 (×3): qty 2

## 2019-10-22 MED ORDER — PANTOPRAZOLE SODIUM 40 MG PO TBEC
80.0000 mg | DELAYED_RELEASE_TABLET | Freq: Every day | ORAL | Status: DC
  Administered 2019-10-23 – 2019-10-25 (×3): 80 mg via ORAL
  Filled 2019-10-22 (×4): qty 2

## 2019-10-22 MED ORDER — ORAL CARE MOUTH RINSE
15.0000 mL | Freq: Two times a day (BID) | OROMUCOSAL | Status: DC
  Administered 2019-10-23 – 2019-10-24 (×4): 15 mL via OROMUCOSAL

## 2019-10-22 MED ORDER — ONDANSETRON HCL 4 MG/2ML IJ SOLN: 4.0000 mg | Freq: Four times a day (QID) | INTRAMUSCULAR | Status: DC | PRN

## 2019-10-22 MED ORDER — POLYETHYLENE GLYCOL 3350 17 G PO PACK: 17.0000 g | PACK | Freq: Every day | ORAL | Status: DC | PRN

## 2019-10-22 NOTE — ED Provider Notes (Signed)
[facility] DEPT Provider Note   CSN: [id number] Arrival date & time: 10/22/19  1650     History Chief Complaint  Patient presents with  . Abdominal Pain    Melanie Best is a 74 y.o. female.  Pt presents to the ED today with weakness.  The pt initially presented to Dr. Marin Olp as a follow up for her renal cell carcinoma.  Pt unfortunately has a large mass in the left kidney which is invading into the left renal vein.  She also has a DVT in her left common femoral vein and is extending into her IVC .  She is on Xarelto.  Pt has not eaten in about 5 days, but has been drinking.  She is having a lot of pelvic pain.  She was given some pain medicine in his office and pain has improved.  Pt has also had some intermittent hematuria or vaginal bleeding (pt is unsure which).  She said it's been going on since mid-July periodically.  Pt can only urinate when lying on her left side.  Dr. Marin Olp did some labs at his office which show a Hgb of 6.3.        Past Medical History:  Diagnosis Date  . Anemia   . Anxiety   . Arthritis    R leg  . Cancer ([place])    kidney  . Chronic fatigue 12/13/2016  . CKD (chronic kidney disease), stage II    removed R kidney- 2010- for Cancer, followed by Dr. Diona Fanti  . COPD (chronic obstructive pulmonary disease) (HCC)    uses O2-2 liters , continuously , followed by Dr. Chase Caller  . Cough 08-02-2015  . Diabetes mellitus, type 2 ([place])   . DM (diabetes mellitus) ([place]) 01/27/2012  . DVT (deep venous thrombosis), left    2011- treated /w coumadin   . Dyslipidemia   . Dysrhythmia   . Esophageal reflux   . GERD (gastroesophageal reflux disease) 01/27/2012  . Glaucoma   . Goals of care, counseling/discussion 12/13/2016  . Hemoptysis 08-02-15  . History of radiation therapy 01/09/12,01/11/12,01/15/12,01/17/12,& 01/19/12   rul lung,50Gy/56fx  . HTN (hypertension) 01/27/2012  . Hyperlipidemia 01/27/2012  . Hypertension    sees Dr.  Deforest Hoyles for PCP, denies ever having stress or ECHO, ?when & where she had  last  EKG  . Leukocytosis   . Lung cancer ([place])    NSCLC  . Osteopenia   . Renal cell carcinoma of right kidney ([place]) 12/13/2016  . Shortness of breath   . Syncope   . Tremor   . Urinary incontinence   . Vitamin D deficiency     Patient Active Problem List   Diagnosis Date Noted  . Acute blood loss anemia 10/22/2019  . Insomnia 11/26/2018  . Decreased appetite 11/26/2018  . Hypokalemia 02/11/2018  . Encounter for antineoplastic chemotherapy 07/25/2017  . Port-A-Cath in place 04/05/2017  . Chronic hypoxemic respiratory failure ([place]) 01/15/2017  . Renal cell carcinoma of right kidney ([place]) 12/13/2016  . Chronic fatigue 12/13/2016  . Goals of care, counseling/discussion 12/13/2016  . Encounter for antineoplastic immunotherapy 12/13/2016  . Malignant neoplasm of left lung ([place]) 01/13/2015  . Renal insufficiency 06/30/2014  . Renal cell carcinoma ([place]) 06/30/2014  . COPD, severe ([place]) 09/13/2013  . Malignant neoplasm of lower lobe of left lung ([place]) 09/09/2013  . Malignant neoplasm of upper lobe of right lung ([place]) 09/09/2013  . Lung nodule, left upper lobe and left lower lobe 03/16/2012  .  COPD exacerbation ([place]) 03/16/2012  . Chest pain 01/27/2012  . Leukocytosis 01/27/2012  . DM (diabetes mellitus) ([place]) 01/27/2012  . Hyperlipidemia 01/27/2012  . CKD (chronic kidney disease), stage II 01/27/2012  . HTN (hypertension) 01/27/2012  . Anxiety 01/27/2012  . GERD (gastroesophageal reflux disease) 01/27/2012  . RUL NSCLC Prsumed STage 1A 10/13/2011  . COPD, very severe ([place]) 08/05/2011    Past Surgical History:  Procedure Laterality Date  . BACK SURGERY     for pinched nerve, 2011, here at [facility]  . EYE SURGERY     cataracts removed, ?IOL  . FLEXIBLE BRONCHOSCOPY  11/29/2011  . IR FLUORO GUIDE PORT INSERTION RIGHT  01/30/2017  . IR US GUIDE VASC ACCESS RIGHT  01/30/2017  . removed blood clot     left  upper abd quad   . right kidney removed    . right nephrectomy       OB History   No obstetric history on file.     Family History  Problem Relation Age of Onset  . Emphysema Father   . Cancer Sister        behind eye  . Cancer Brother        lung  . Cancer Brother        lung  . Cancer Brother        throat  . Breast cancer Neg Hx     Social History   Tobacco Use  . Smoking status: Former Smoker    Packs/day: 1.00    Years: 35.00    Pack years: 35.00    Types: Cigarettes    Quit date: 03/20/2004    Years since quitting: 15.6  . Smokeless tobacco: Never Used  Vaping Use  . Vaping Use: Never used  Substance Use Topics  . Alcohol use: No  . Drug use: No    Home Medications Prior to Admission medications   Medication Sig Start Date End Date Taking? Authorizing Provider  alendronate (FOSAMAX) 70 MG tablet Take 70 mg by mouth once a week. 03/08/19   [provider]  brinzolamide (AZOPT) 1 % ophthalmic suspension Place 1 drop into both eyes 3 (three) times daily. Reported on 08/12/2015    [provider]  clonazePAM (KLONOPIN) 1 MG tablet Take 1 mg by mouth 2 (two) times daily as needed. For anxiety    [provider]  diltiazem (CARDIZEM CD) 180 MG 24 hr capsule Take 180 mg by mouth daily. 04/14/14   [provider]  ergocalciferol (VITAMIN D2) 50000 UNITS capsule Take 50,000 Units by mouth 2 (two) times a week. Take on Monday & thursday    [provider]  esomeprazole (NEXIUM) 40 MG capsule Take 40 mg by mouth daily. 04/14/19   [provider]  furosemide (LASIX) 20 MG tablet Take 20 mg by mouth daily as needed. For fluid retention    [provider]  hydrocortisone cream 1 % Apply 1 application topically 2 (two) times daily. Patient not taking: Reported on 09/29/2019 10/16/17   Curt Bears, MD  ipratropium-albuterol (DUONEB) 0.5-2.5 (3) MG/3ML SOLN Take 3 mLs by nebulization 4 (four) times daily as  needed. Patient taking differently: Take 3 mLs by nebulization 4 (four) times daily as needed (SOB/Wheezing).  01/13/15   Brand Males, MD  lidocaine-prilocaine (EMLA) cream Apply 1 application topically as needed (port access).     [provider]  lovastatin (MEVACOR) 20 MG tablet Take 20 mg by mouth daily.  08/04/18  [provider]  LUMIGAN 0.01 % SOLN Place 1 drop into both eyes daily.  07/26/18   [provider]  magic mouthwash w/lidocaine SOLN Take 5 mLs by mouth 4 (four) times daily as needed for mouth pain. Swish and spit before meals and at bedtime. Patient not taking: Reported on 05/13/2019 02/11/18   Maryanna Shape, NP  mirtazapine (REMERON) 7.5 MG tablet Take 1 tablet (7.5 mg total) by mouth daily. 06/12/19   Heilingoetter, Cassandra L, PA-C  mupirocin ointment (BACTROBAN) 2 % Place 1 application into the nose 2 (two) times daily. Patient not taking: Reported on 09/29/2019 11/22/17   Sandi Mealy E., PA-C  Nebulizers (VIOS AEROSOL DELIVERY SYSTEM) MISC USE UTD BY MD WITH NEBULIZER SOLUTION 04/05/18   [provider]  OXYGEN Inhale 2 L into the lungs continuous.    [provider]  predniSONE (DELTASONE) 10 MG tablet Take 10 mg by mouth daily with breakfast.     [provider]  XARELTO 20 MG TABS tablet TAKE 1 TABLET BY MOUTH  DAILY WITH SUPPER Patient taking differently: Take 20 mg by mouth daily with supper.  09/16/19   Heilingoetter, Cassandra L, PA-C  CABOMETYX 40 MG tablet TAKE 1 TABLET (40MG ) BY MOUTH ONCE DAILY. TAKE ON AN EMPTY STOMACH 1 HOUR BEFORE OR 2 HOURS AFTER MEALS 01/09/19   Heilingoetter, Cassandra L, PA-C    Allergies    Patient has no known allergies.  Review of Systems   Review of Systems  Respiratory: Positive for shortness of breath.   Genitourinary: Positive for vaginal bleeding.  All other systems reviewed and are negative.   Physical Exam Updated Vital Signs BP (!) 117/58 (BP Location: Left Arm)    Pulse 96   Temp 97.9 F (36.6 C) (Oral)   Resp 13   SpO2 100%   Physical Exam Vitals and nursing note reviewed.  Constitutional:      General: She is in acute distress.     Appearance: She is well-developed.  HENT:     Head: Normocephalic and atraumatic.     Mouth/Throat:     Mouth: Mucous membranes are moist.     Pharynx: Oropharynx is clear.  Eyes:     Extraocular Movements: Extraocular movements intact.     Pupils: Pupils are equal, round, and reactive to light.  Cardiovascular:     Rate and Rhythm: Normal rate and regular rhythm.  Pulmonary:     Effort: Tachypnea present.     Breath sounds: Normal breath sounds.  Abdominal:     General: Abdomen is flat. Bowel sounds are normal.     Palpations: Abdomen is soft.     Tenderness: There is abdominal tenderness in the suprapubic area.  Genitourinary:    Vagina: Normal.     Comments: No blood in vagina Skin:    General: Skin is warm.     Capillary Refill: Capillary refill takes less than 2 seconds.  Neurological:     General: No focal deficit present.     Mental Status: She is alert and oriented to person, place, and time.  Psychiatric:        Mood and Affect: Mood normal.        Behavior: Behavior normal.     ED Results / Procedures / Treatments   Labs (all labs ordered are listed, but only abnormal results are displayed) Labs Reviewed  HEMOGLOBIN AND HEMATOCRIT, BLOOD - Abnormal; Notable for the following components:      Result Value  Hemoglobin 6.0 (*)    HCT 21.0 (*)    All other components within normal limits  URINE CULTURE  SARS CORONAVIRUS 2 BY RT PCR (HOSPITAL ORDER, [facility])  URINALYSIS, ROUTINE W REFLEX MICROSCOPIC  TYPE AND SCREEN  PREPARE RBC (CROSSMATCH)    EKG EKG Interpretation  Date/Time:  Wednesday October 22 2019 17:17:09 EDT Ventricular Rate:  97 PR Interval:    QRS Duration: 124 QT Interval:  371 QTC Calculation: 472 R Axis:   78 Text  Interpretation: Wide-QRS tachycardia Paired ventricular premature complexes Nonspecific intraventricular conduction delay Artifact in lead(s) I II aVR aVL aVF V1 V2 V3 V4 V5 V6 poor baseline Confirmed by Isla Pence [phone]) on 10/22/2019 5:31:37 PM   Radiology No results found.  Procedures Procedures (including critical care time)  Medications Ordered in ED Medications  0.9 %  sodium chloride infusion (Manually program via Guardrails IV Fluids) (has no administration in time range)    ED Course  I have reviewed the triage vital signs and the nursing notes.  Pertinent labs & imaging results that were available during my care of the patient were reviewed by me and considered in my medical decision making (see chart for details).    MDM Rules/Calculators/A&P                         Urine is grossly bloody, so anemia is from hematuria.  Pt's hgb is 6, so I ordered 2 units for transfusion.  Pt d/w Dr. Dione Plover (triad) for admission.  CRITICAL CARE Performed by: Isla Pence   Total critical care time: 30 minutes  Critical care time was exclusive of separately billable procedures and treating other patients.  Critical care was necessary to treat or prevent imminent or life-threatening deterioration.  Critical care was time spent personally by me on the following activities: development of treatment plan with patient and/or surrogate as well as nursing, discussions with consultants, evaluation of patient's response to treatment, examination of patient, obtaining history from patient or surrogate, ordering and performing treatments and interventions, ordering and review of laboratory studies, ordering and review of radiographic studies, pulse oximetry and re-evaluation of patient's condition.    Final Clinical Impression(s) / ED Diagnoses Final diagnoses:  Symptomatic anemia  On rivaroxaban therapy  Gross hematuria    Rx / DC Orders ED Discharge Orders    None         Isla Pence, MD 10/22/19 1904

## 2019-10-22 NOTE — Progress Notes (Signed)
Hematology and Oncology Follow Up Visit  Melanie Best [id number] 1945-09-14 74 y.o. 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 Winkles is back for a follow-up.  Unfortunately, she really has declined since we last saw her.  We last saw her about a month ago.  She has been over to the emergency room on several occasions.  She is having vaginal bleeding.  She had a CT scan that was done in the emergency room back in July.  This was of the abdomen and pelvis.  This showed a large mass in the left kidney measuring 7.8 x 7.3 cm.  It extended 10.3 cm.  There was left renal vein invasion.  There is no adenopathy.  She had a partially occlusive DVT in the left common femoral vein.  Again, she is having vaginal bleeding.  Her hemoglobin is 6.3 today.  She is not eaten for about 5 days.  Her granddaughter has come with her.  She confirms that Ms. Houseworth is declining.  Again, she is very weak.  She is in a wheelchair.  She does have supplemental oxygen.  She has lost quite a bit of weight.  We really not been able to embark upon any additional therapy for the metastatic renal cell carcinoma because of her marginal performance status.  She has had no obvious fever.  There has been no headache.  I would say that her performance status right now is ECOG 3.  A real problem is that she is on Xarelto.  I think we will probably going to have to get the Xarelto stopped.  The Xarelto may need to be reversed with the new reversal agent- Andexxa.  She will need to have a filter put in as I just do not think she will be able to take anticoagulation with his bleeding.  Regard to have to think about radiation therapy as I suspect that she probably has invasion into the gynecologic structures which is leading to this bleeding.  She is hurting quite a bit.  She is having a lot of pain in the  pelvis.  Medications:  Current Outpatient Medications:    alendronate (FOSAMAX) 70 MG tablet, Take 70 mg by mouth once a week., Disp: , Rfl:    brinzolamide (AZOPT) 1 % ophthalmic suspension, Place 1 drop into both eyes 3 (three) times daily. Reported on 08/12/2015, Disp: , Rfl:    clonazePAM (KLONOPIN) 1 MG tablet, Take 1 mg by mouth 2 (two) times daily as needed. For anxiety, Disp: , Rfl:    diltiazem (CARDIZEM CD) 180 MG 24 hr capsule, Take 180 mg by mouth daily., Disp: , Rfl: 0   ergocalciferol (VITAMIN D2) 50000 UNITS capsule, Take 50,000 Units by mouth 2 (two) times a week. Take on Monday & thursday, Disp: , Rfl:    esomeprazole (NEXIUM) 40 MG capsule, Take 40 mg by mouth daily., Disp: , Rfl:    furosemide (LASIX) 20 MG tablet, Take 20 mg by mouth daily as needed. For fluid retention, Disp: , Rfl:    hydrocortisone cream 1 %, Apply 1 application topically 2 (two) times daily. (Patient not taking: Reported on 09/29/2019), Disp: 30 g, Rfl: 0   ipratropium-albuterol (DUONEB) 0.5-2.5 (3) MG/3ML SOLN, Take 3 mLs by nebulization 4 (four) times daily as needed. (Patient taking differently: Take 3 mLs by nebulization 4 (four) times daily as needed (SOB/Wheezing). ),  Disp: 360 mL, Rfl: 4   lidocaine-prilocaine (EMLA) cream, Apply 1 application topically as needed (port access). , Disp: , Rfl:    lovastatin (MEVACOR) 20 MG tablet, Take 20 mg by mouth daily. , Disp: , Rfl:    LUMIGAN 0.01 % SOLN, Place 1 drop into both eyes daily. , Disp: , Rfl:    magic mouthwash w/lidocaine SOLN, Take 5 mLs by mouth 4 (four) times daily as needed for mouth pain. Swish and spit before meals and at bedtime. (Patient not taking: Reported on 05/13/2019), Disp: 480 mL, Rfl: 0   mirtazapine (REMERON) 7.5 MG tablet, Take 1 tablet (7.5 mg total) by mouth daily., Disp: 30 tablet, Rfl: 1   mupirocin ointment (BACTROBAN) 2 %, Place 1 application into the nose 2 (two) times daily. (Patient not taking: Reported on  09/29/2019), Disp: 30 g, Rfl: 1   Nebulizers (VIOS AEROSOL DELIVERY SYSTEM) MISC, USE UTD BY MD WITH NEBULIZER SOLUTION, Disp: , Rfl:    OXYGEN, Inhale 2 L into the lungs continuous., Disp: , Rfl:    predniSONE (DELTASONE) 10 MG tablet, Take 10 mg by mouth daily with breakfast. , Disp: , Rfl:    XARELTO 20 MG TABS tablet, TAKE 1 TABLET BY MOUTH  DAILY WITH SUPPER (Patient taking differently: Take 20 mg by mouth daily with supper. ), Disp: 30 tablet, Rfl: 11 No current facility-administered medications for this visit.  Facility-Administered Medications Ordered in Other Visits:    sodium chloride flush (NS) 0.9 % injection 10 mL, 10 mL, Intravenous, PRN, Curt Bears, MD   sodium chloride flush (NS) 0.9 % injection 10 mL, 10 mL, Intravenous, PRN, Curt Bears, MD, 10 mL at 12/31/18 1348  Allergies: No Known Allergies  Past Medical History, Surgical history, Social history, and Family History were reviewed and updated.  Review of Systems: Review of Systems  Constitutional: Positive for unexpected weight change.  HENT:  Negative.   Eyes: Negative.   Respiratory: Positive for shortness of breath.   Cardiovascular: Negative.   Gastrointestinal: Positive for nausea.  Endocrine: Negative.   Genitourinary: Positive for vaginal bleeding.   Musculoskeletal: Positive for back pain.  Skin: Negative.   Neurological: Negative.   Hematological: Negative.   Psychiatric/Behavioral: Negative.     Physical Exam:  height is 5\' 5"  (1.651 m). Her oral temperature is 98.3 F (36.8 C). Her blood pressure is 85/62 (abnormal) and her pulse is 110 (abnormal). Her respiration is 20 and oxygen saturation is 100%.   Wt Readings from Last 3 Encounters:  10/05/19 118 lb (53.5 kg)  09/29/19 118 lb (53.5 kg)  09/26/19 118 lb (53.5 kg)    Physical Exam Vitals reviewed.  Constitutional:      Comments: This is a chronically ill, thin appearing African-American female.  She has a weak voice.  She  is malnourished.  HENT:     Head: Normocephalic and atraumatic.  Eyes:     Pupils: Pupils are equal, round, and reactive to light.  Cardiovascular:     Rate and Rhythm: Normal rate and regular rhythm.     Heart sounds: Normal heart sounds.  Pulmonary:     Effort: Pulmonary effort is normal.     Breath sounds: Normal breath sounds.  Abdominal:     General: Bowel sounds are normal.     Palpations: Abdomen is soft.     Comments: Her abdomen is soft.  She has tenderness in the lower abdomen.  There is no obvious mass that is palpable.  She  has decreased bowel sounds.  Musculoskeletal:        General: No tenderness or deformity. Normal range of motion.     Cervical back: Normal range of motion.  Lymphadenopathy:     Cervical: No cervical adenopathy.  Skin:    General: Skin is warm and dry.     Findings: No erythema or rash.  Neurological:     Mental Status: She is alert and oriented to person, place, and time.  Psychiatric:        Behavior: Behavior normal.        Thought Content: Thought content normal.        Judgment: Judgment normal.      Lab Results  Component Value Date   WBC 13.6 (H) 10/22/2019   HGB 6.3 (LL) 10/22/2019   HCT 22.2 (L) 10/22/2019   MCV 102.8 (H) 10/22/2019   PLT 146 (L) 10/22/2019     Chemistry      Component Value Date/Time   NA 140 10/22/2019 1412   NA 141 03/08/2017 1258   K 3.6 10/22/2019 1412   K 3.7 03/08/2017 1258   CL 107 10/22/2019 1412   CO2 22 10/22/2019 1412   CO2 23 03/08/2017 1258   BUN 33 (H) 10/22/2019 1412   BUN 24.1 03/08/2017 1258   CREATININE 1.58 (H) 10/22/2019 1412   CREATININE 1.4 (H) 03/08/2017 1258      Component Value Date/Time   CALCIUM 8.8 (L) 10/22/2019 1412   CALCIUM 8.8 03/08/2017 1258   ALKPHOS 130 (H) 10/22/2019 1412   ALKPHOS 65 03/08/2017 1258   AST 47 (H) 10/22/2019 1412   AST 8 03/08/2017 1258   ALT 61 (H) 10/22/2019 1412   ALT 20 03/08/2017 1258   BILITOT 0.5 10/22/2019 1412   BILITOT 0.40  03/08/2017 1258      Impression and Plan: Ms. Ladouceur is a very nice 75 year old African-American female.  She has metastatic renal cell carcinoma.  She has been on passive treatment which she has not been able to tolerate.  She was on immunotherapy but progressed.  The problem now is bleeding.  She is quite anemic.  Hemoglobin is quite low at 6.3.  She will need to be transfused.  She will be transferred over to [facility] emergency room.  From there, I am sure she will be admitted.  She will need radiation therapy in my opinion to the pelvic area to see about getting this bleeding to stop.  I think that we are going to have to discuss with her possible end-of-life issues.  I just do not see that she has a performance status that is good enough to be able to tolerate any further therapy.  I know that there are options for renal cell carcinoma but I do still see that she is strong enough for this.  We really want to get her quality of life better.  I think we can get the bleeding to stop and get her pain better then she will feel better.  She is on Xarelto.  This will be stopped.  She can have the Xarelto reversed with andexanet.  She will need to have a filter put in as she is clearly at risk for thromboembolic disease.  Hopefully, her overall quality of life can be improved while hospitalized.   Volanda Napoleon, MD 8/4/20213:59 PM

## 2019-10-22 NOTE — H&P (Addendum)
[facility] History and Physical  Melanie Best WUJ:[id number] DOB: Dec 20, 1945 DOA: 10/22/2019  Referring physician: Dr. Gilford Raid PCP: [name], MD   Chief Complaint: bleeding  HPI: Melanie Best is a 74 y.o. 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   Patient was seen earlier today at her oncologist Dr. Antonieta Pert office Her hemoglobin was found to be 6.3 and she was directed to the ED In his note Dr. Marin Olp notes concern for possible vaginal bleeding and possible need for radiation therapy to the pelvic area He would also like her to have an IVC filter placed on her Xarelto to be stopped  In the ED she reports she was feeling fairly well until yesterday when she started to be nauseous and feel unwell Endorses blood in her urine intermittently, no clots Unsure if she is having any blood from her vagina  In the ED vital signs were normal.  She was ordered to be transfused 2 unit of blood.  She was admitted for management of her anemia and further evaluation of her anticoagulation plan.   Review of Systems:  Pertinent positives and negative per HPI, all others reviewed and negative   Past Medical History:  Diagnosis Date  . Anemia   . Anxiety   . Arthritis    R leg  . Cancer ([place])    kidney  . Chronic fatigue 12/13/2016  . CKD (chronic kidney disease), stage II    removed R kidney- 2010- for Cancer, followed by Dr. Diona Fanti  . COPD (chronic obstructive pulmonary disease) (HCC)    uses O2-2 liters , continuously , followed by Dr. Chase Caller  . Cough 08-02-2015  . Diabetes mellitus, type 2 ([place])   . DM (diabetes mellitus) ([place]) 01/27/2012  . DVT (deep venous thrombosis), left    2011- treated /w coumadin   . Dyslipidemia   . Dysrhythmia   . Esophageal reflux   . GERD (gastroesophageal reflux  disease) 01/27/2012  . Glaucoma   . Goals of care, counseling/discussion 12/13/2016  . Hemoptysis 08-02-15  . History of radiation therapy 01/09/12,01/11/12,01/15/12,01/17/12,& 01/19/12   rul lung,50Gy/55fx  . HTN (hypertension) 01/27/2012  . Hyperlipidemia 01/27/2012  . Hypertension    sees Dr. Deforest Hoyles for PCP, denies ever having stress or ECHO, ?when & where she had  last  EKG  . Leukocytosis   . Lung cancer ([place])    NSCLC  . Osteopenia   . Renal cell carcinoma of right kidney ([place]) 12/13/2016  . Shortness of breath   . Syncope   . Tremor   . Urinary incontinence   . Vitamin D deficiency    Past Surgical History:  Procedure Laterality Date  . BACK SURGERY     for pinched nerve, 2011, [facility] at [facility]  . EYE SURGERY     cataracts removed, ?IOL  . FLEXIBLE BRONCHOSCOPY  11/29/2011  . IR FLUORO GUIDE PORT INSERTION RIGHT  01/30/2017  . IR US GUIDE VASC ACCESS RIGHT  01/30/2017  . removed blood clot     left upper abd quad   . right kidney removed    . right nephrectomy     Social History:  reports that she quit smoking about 15 years ago. Her smoking use included cigarettes. She has a 35.00 pack-year smoking history. She has never used smokeless tobacco. She reports that  she does not drink alcohol and does not use drugs.  No Known Allergies  Family History  Problem Relation Age of Onset  . Emphysema Father   . Cancer Sister        behind eye  . Cancer Brother        lung  . Cancer Brother        lung  . Cancer Brother        throat  . Breast cancer Neg Hx     Prior to Admission medications   Medication Sig Start Date End Date Taking? Authorizing Provider  alendronate (FOSAMAX) 70 MG tablet Take 70 mg by mouth once a week. 03/08/19   [provider]  brinzolamide (AZOPT) 1 % ophthalmic suspension Place 1 drop into both eyes 3 (three) times daily. Reported on 08/12/2015    [provider]  clonazePAM (KLONOPIN) 1 MG tablet Take 1 mg by mouth 2 (two) times  daily as needed. For anxiety    [provider]  diltiazem (CARDIZEM CD) 180 MG 24 hr capsule Take 180 mg by mouth daily. 04/14/14   [provider]  ergocalciferol (VITAMIN D2) 50000 UNITS capsule Take 50,000 Units by mouth 2 (two) times a week. Take on Monday & thursday    [provider]  esomeprazole (NEXIUM) 40 MG capsule Take 40 mg by mouth daily. 04/14/19   [provider]  furosemide (LASIX) 20 MG tablet Take 20 mg by mouth daily as needed. For fluid retention    [provider]  hydrocortisone cream 1 % Apply 1 application topically 2 (two) times daily. Patient not taking: Reported on 09/29/2019 10/16/17   Curt Bears, MD  ipratropium-albuterol (DUONEB) 0.5-2.5 (3) MG/3ML SOLN Take 3 mLs by nebulization 4 (four) times daily as needed. Patient taking differently: Take 3 mLs by nebulization 4 (four) times daily as needed (SOB/Wheezing).  01/13/15   Brand Males, MD  lidocaine-prilocaine (EMLA) cream Apply 1 application topically as needed (port access).     [provider]  lovastatin (MEVACOR) 20 MG tablet Take 20 mg by mouth daily.  08/04/18   [provider]  LUMIGAN 0.01 % SOLN Place 1 drop into both eyes daily.  07/26/18   [provider]  magic mouthwash w/lidocaine SOLN Take 5 mLs by mouth 4 (four) times daily as needed for mouth pain. Swish and spit before meals and at bedtime. Patient not taking: Reported on 05/13/2019 02/11/18   Maryanna Shape, NP  mirtazapine (REMERON) 7.5 MG tablet Take 1 tablet (7.5 mg total) by mouth daily. 06/12/19   Heilingoetter, Cassandra L, PA-C  mupirocin ointment (BACTROBAN) 2 % Place 1 application into the nose 2 (two) times daily. Patient not taking: Reported on 09/29/2019 11/22/17   Sandi Mealy E., PA-C  Nebulizers (VIOS AEROSOL DELIVERY SYSTEM) MISC USE UTD BY MD WITH NEBULIZER SOLUTION 04/05/18   [provider]  OXYGEN Inhale 2 L into the lungs continuous.    [provider]  predniSONE (DELTASONE) 10 MG tablet Take 10 mg by mouth daily with breakfast.     [provider]  XARELTO 20 MG TABS tablet TAKE 1 TABLET BY MOUTH  DAILY WITH SUPPER Patient taking differently: Take 20 mg by mouth daily with supper.  09/16/19   Heilingoetter, Cassandra L, PA-C  CABOMETYX 40 MG tablet TAKE 1 TABLET (40MG ) BY MOUTH ONCE DAILY. TAKE ON AN EMPTY STOMACH 1 HOUR BEFORE OR 2 HOURS AFTER MEALS 01/09/19   Heilingoetter, Cassandra L,  PA-C   Physical Exam: Vitals:   10/22/19 1930 10/22/19 2042 10/22/19 2043 10/22/19 2049  BP: (!) 114/56 (!) 100/58 (!) 100/58 (!) 109/52  Pulse: 94 95 95 92  Resp: 20 20 20 16   Temp:   98.7 F (37.1 C)   TempSrc:   Oral   SpO2: 100% 100%  100%    Wt Readings from Last 3 Encounters:  10/05/19 53.5 kg  09/29/19 53.5 kg  09/26/19 53.5 kg    General:  Appears calm, mildly comfortable Eyes: PERRL, normal lids, irises & conjunctiva ENT: grossly normal hearing, lips & tongue Neck: no LAD, masses or thyromegaly Cardiovascular: RRR, no m/r/g. No LE edema.  Respiratory: CTA bilaterally, no w/r/r. Normal respiratory effort. Abdomen: soft, ntnd Skin: no rash or induration seen on limited exam Musculoskeletal: grossly normal tone BUE/BLE Psychiatric: grossly normal mood and affect, speech fluent and appropriate Neurologic: grossly non-focal.          Labs on Admission:  Basic Metabolic Panel: Recent Labs  Lab 10/22/19 1412  NA 140  K 3.6  CL 107  CO2 22  GLUCOSE 104*  BUN 33*  CREATININE 1.58*  CALCIUM 8.8*   Liver Function Tests: Recent Labs  Lab 10/22/19 1412  AST 47*  ALT 61*  ALKPHOS 130*  BILITOT 0.5  PROT 6.1*  ALBUMIN 3.6   No results for input(s): LIPASE, AMYLASE in the last 168 hours. No results for input(s): AMMONIA in the last 168 hours. CBC: Recent Labs  Lab 10/22/19 1412 10/22/19 1751  WBC 13.6*  --   NEUTROABS 12.0*  --   HGB 6.3* 6.0*  HCT 22.2* 21.0*  MCV 102.8*  --   PLT 146*   --    Cardiac Enzymes: No results for input(s): CKTOTAL, CKMB, CKMBINDEX, TROPONINI in the last 168 hours.  BNP (last 3 results) No results for input(s): BNP in the last 8760 hours.  ProBNP (last 3 results) No results for input(s): PROBNP in the last 8760 hours.  CBG: No results for input(s): GLUCAP in the last 168 hours.  Radiological Exams on Admission: No results found.  EKG: Independently reviewed.  Poor baseline with artifact in many leads, within those limitations no obvious ischemic changes.  Assessment/Plan Active Problems:   COPD, very severe (HCC)   DM (diabetes mellitus) ([place])   Hyperlipidemia   CKD (chronic kidney disease), stage II   HTN (hypertension)   Renal cell carcinoma (HCC)   Acute blood loss anemia  #Acute blood loss anemia #Renal cell carcinoma Known left renal cell carcinoma with invasion into the left renal vein and the IVC with associated thrombus on Xarelto.  Some concern for Dr. Marin Olp that there may be vaginal bleeding however on ED physicians exam blood clearly coming from the urine and review of CT scan shows no evidence of abnormalities in the reproductive organs, more likely secondary to known renal cell carcinoma.  Ordered for 2 units of packed RBCs we will recheck hemoglobin in the a.m. given poor functional status advanced malignancy and limited treatment options would benefit from further goals of care discussion. -Discontinue Xarelto per Dr. Antonieta Pert recommendations -Oncology consult in a.m. for further discussion of anticoagulation and malignancy treatment plans -IR consult in a.m. for consideration of IVC filter -Defer administration of Andexxa pending above consults and timing of possible procedures -N.p.o. at midnight -Transitions of care consult  #Known medical problems Glaucoma: Continue brinzolamide, continue Lumigan Hypertension: Continue diltiazem GERD: Continue PPI COPD: Continue DuoNeb as needed, continue prednisone 10  mg  daily Insomnia: Continue mirtazapine HLD: Continue statin DM2: 4 times daily checks with aspart ISS   Code Status: Full code, unconfirmed DVT Prophylaxis: N/A Family Communication: None Disposition Plan: Inpatient  Time spent: 50 minutes  Clarnce Flock MD/MPH [facility]

## 2019-10-22 NOTE — ED Notes (Signed)
Date and time results received: 10/22/19 6:26 PM  Test: Hgb Critical Value: 6.0  Name of Provider Notified: Gilford Raid, EDP

## 2019-10-23 ENCOUNTER — Encounter (HOSPITAL_COMMUNITY): Payer: Self-pay | Admitting: Family Medicine

## 2019-10-23 ENCOUNTER — Telehealth: Payer: Self-pay | Admitting: Hematology & Oncology

## 2019-10-23 DIAGNOSIS — R319 Hematuria, unspecified: Secondary | ICD-10-CM

## 2019-10-23 DIAGNOSIS — C649 Malignant neoplasm of unspecified kidney, except renal pelvis: Secondary | ICD-10-CM

## 2019-10-23 DIAGNOSIS — N182 Chronic kidney disease, stage 2 (mild): Secondary | ICD-10-CM

## 2019-10-23 DIAGNOSIS — R310 Gross hematuria: Secondary | ICD-10-CM

## 2019-10-23 DIAGNOSIS — R634 Abnormal weight loss: Secondary | ICD-10-CM

## 2019-10-23 DIAGNOSIS — I82492 Acute embolism and thrombosis of other specified deep vein of left lower extremity: Secondary | ICD-10-CM

## 2019-10-23 DIAGNOSIS — N1832 Chronic kidney disease, stage 3b: Secondary | ICD-10-CM

## 2019-10-23 LAB — COMPREHENSIVE METABOLIC PANEL
ALT: 94 U/L — ABNORMAL HIGH (ref 0–44)
AST: 101 U/L — ABNORMAL HIGH (ref 15–41)
Albumin: 3 g/dL — ABNORMAL LOW (ref 3.5–5.0)
Alkaline Phosphatase: 221 U/L — ABNORMAL HIGH (ref 38–126)
Anion gap: 11 (ref 5–15)
BUN: 25 mg/dL — ABNORMAL HIGH (ref 8–23)
CO2: 20 mmol/L — ABNORMAL LOW (ref 22–32)
Calcium: 7.9 mg/dL — ABNORMAL LOW (ref 8.9–10.3)
Chloride: 106 mmol/L (ref 98–111)
Creatinine, Ser: 1.3 mg/dL — ABNORMAL HIGH (ref 0.44–1.00)
GFR calc Af Amer: 47 mL/min — ABNORMAL LOW (ref >60)
GFR calc non Af Amer: 41 mL/min — ABNORMAL LOW (ref >60)
Glucose, Bld: 103 mg/dL — ABNORMAL HIGH (ref 70–99)
Potassium: 3.8 mmol/L (ref 3.5–5.1)
Sodium: 137 mmol/L (ref 135–145)
Total Bilirubin: 0.9 mg/dL (ref 0.3–1.2)
Total Protein: 5.6 g/dL — ABNORMAL LOW (ref 6.5–8.1)

## 2019-10-23 LAB — CBC
HCT: 29.7 % — ABNORMAL LOW (ref 36.0–46.0)
Hemoglobin: 9.3 g/dL — ABNORMAL LOW (ref 12.0–15.0)
MCH: 29.3 pg (ref 26.0–34.0)
MCHC: 31.3 g/dL (ref 30.0–36.0)
MCV: 93.7 fL (ref 80.0–100.0)
Platelets: 108 10*3/uL — ABNORMAL LOW (ref 150–400)
RBC: 3.17 MIL/uL — ABNORMAL LOW (ref 3.87–5.11)
RDW: 17.8 % — ABNORMAL HIGH (ref 11.5–15.5)
WBC: 11.7 10*3/uL — ABNORMAL HIGH (ref 4.0–10.5)
nRBC: 0.7 % — ABNORMAL HIGH (ref 0.0–0.2)

## 2019-10-23 LAB — BPAM RBC
Blood Product Expiration Date: 202109042359
Blood Product Expiration Date: 202109042359
ISSUE DATE / TIME: 202108042035
ISSUE DATE / TIME: 202108042347
Unit Type and Rh: 5100
Unit Type and Rh: 5100

## 2019-10-23 LAB — TYPE AND SCREEN
ABO/RH(D): O POS
Antibody Screen: NEGATIVE
Unit division: 0
Unit division: 0

## 2019-10-23 LAB — URINE CULTURE

## 2019-10-23 LAB — HEMOGLOBIN A1C
Hgb A1c MFr Bld: 5.3 % (ref 4.8–5.6)
Mean Plasma Glucose: 105.41 mg/dL

## 2019-10-23 LAB — APTT: aPTT: 47 seconds — ABNORMAL HIGH (ref 24–36)

## 2019-10-23 LAB — GLUCOSE, CAPILLARY
Glucose-Capillary: 95 mg/dL (ref 70–99)
Glucose-Capillary: 98 mg/dL (ref 70–99)
Glucose-Capillary: 171 mg/dL — ABNORMAL HIGH (ref 70–99)
Glucose-Capillary: 141 mg/dL — ABNORMAL HIGH (ref 70–99)

## 2019-10-23 LAB — LACTATE DEHYDROGENASE: LDH: 386 U/L — ABNORMAL HIGH (ref 98–192)

## 2019-10-23 LAB — PROTIME-INR
INR: 1.8 — ABNORMAL HIGH (ref 0.8–1.2)
Prothrombin Time: 19.9 seconds — ABNORMAL HIGH (ref 11.4–15.2)

## 2019-10-23 MED ORDER — SODIUM CHLORIDE 0.9% FLUSH: 10.0000 mL | INTRAVENOUS | Status: DC | PRN

## 2019-10-23 MED ORDER — ADULT MULTIVITAMIN W/MINERALS CH
1.0000 | ORAL_TABLET | Freq: Every day | ORAL | Status: DC
  Administered 2019-10-23 – 2019-10-25 (×3): 1 via ORAL
  Filled 2019-10-23 (×3): qty 1

## 2019-10-23 MED ORDER — NYSTATIN 100000 UNIT/ML MT SUSP
5.0000 mL | Freq: Four times a day (QID) | OROMUCOSAL | Status: DC
  Administered 2019-10-23 – 2019-10-25 (×10): 500000 [IU] via ORAL
  Filled 2019-10-23 (×10): qty 5

## 2019-10-23 MED ORDER — CHLORHEXIDINE GLUCONATE CLOTH 2 % EX PADS
6.0000 | MEDICATED_PAD | Freq: Every day | CUTANEOUS | Status: DC
  Administered 2019-10-23 – 2019-10-25 (×3): 6 via TOPICAL

## 2019-10-23 MED ORDER — KATE FARMS STANDARD 1.4 PO LIQD
325.0000 mL | Freq: Two times a day (BID) | ORAL | Status: DC
  Administered 2019-10-23 – 2019-10-24 (×4): 325 mL via ORAL
  Filled 2019-10-23 (×6): qty 325

## 2019-10-23 MED ORDER — PROSOURCE PLUS PO LIQD
30.0000 mL | Freq: Two times a day (BID) | ORAL | Status: DC
  Administered 2019-10-23 (×2): 30 mL via ORAL
  Filled 2019-10-23 (×7): qty 30

## 2019-10-23 MED FILL — Fentanyl Citrate Preservative Free (PF) Inj 100 MCG/2ML: INTRAMUSCULAR | Qty: 2 | Status: AC

## 2019-10-23 NOTE — Telephone Encounter (Signed)
No los 8/4

## 2019-10-23 NOTE — Plan of Care (Signed)
Patient has decreased appetite, will continue to encourage food and liquids. Deanna Artis, RN

## 2019-10-23 NOTE — Consult Note (Signed)
[facility]ist [facility] [place]  Patient name: Melanie Best MRN: [id number] DOB: Jan 14, 1946 Sex: female    HPI: ATHZIRY MILLICAN is a 74 y.o. female seen for discussion regarding management of DVT.  She has metastatic kidney cancer.  Had a right nephrectomy for cancer in two thousand and ten.  Has known extensive cancer in her left kidney with lung metastasis.  Is admitted with generalized weakness and inability to eat and failure to thrive.  Also with hematuria and possible vaginal bleeding as well.  She has been maintained on oral anticoagulant since diagnosis of DVT around January of this year.  The patient reports that she did not have any symptoms.  She denies any swelling or pain in her left leg.  This was seen on CT scan of her abdomen and pelvis with a nonocclusive clot in her left common femoral vein.  Of note on her CT she does have marked extension in the size of her kidney cancer.  She did have thrombus in the left renal vein and this is now progressed on her most recent CT scan with a slight impingement into the vena cava but no flow limitation.  She has no history of pulmonary embolus.  Past Medical History:  Diagnosis Date  . Anemia   . Anxiety   . Arthritis    R leg  . Cancer ([place])    kidney  . Chronic fatigue 12/13/2016  . CKD (chronic kidney disease), stage II    removed R kidney- 2010- for Cancer, followed by Dr. Diona Fanti  . COPD (chronic obstructive pulmonary disease) (HCC)    uses O2-2 liters , continuously , followed by Dr. Chase Caller  . Cough 08-02-2015  . Diabetes mellitus, type 2 ([place])   . DM (diabetes mellitus) ([place]) 01/27/2012  . DVT (deep venous thrombosis), left    2011- treated /w coumadin   . Dyslipidemia   . Dysrhythmia   . Esophageal reflux   . GERD (gastroesophageal reflux disease) 01/27/2012  . Glaucoma   . Goals of care, counseling/discussion 12/13/2016  . Hemoptysis 08-02-15  . History of radiation  therapy 01/09/12,01/11/12,01/15/12,01/17/12,& 01/19/12   rul lung,50Gy/45fx  . HTN (hypertension) 01/27/2012  . Hyperlipidemia 01/27/2012  . Hypertension    sees Dr. Deforest Hoyles for PCP, denies ever having stress or ECHO, ?when & where she had  last  EKG  . Leukocytosis   . Lung cancer ([place])    NSCLC  . Osteopenia   . Renal cell carcinoma of right kidney ([place]) 12/13/2016  . Shortness of breath   . Syncope   . Tremor   . Urinary incontinence   . Vitamin D deficiency     Family History  Problem Relation Age of Onset  . Emphysema Father   . Cancer Sister        behind eye  . Cancer Brother        lung  . Cancer Brother        lung  . Cancer Brother        throat  . Breast cancer Neg Hx     SOCIAL HISTORY: Social History   Tobacco Use  . Smoking status: Former Smoker    Packs/day: 1.00    Years: 35.00    Pack years:  35.00    Types: Cigarettes    Quit date: 03/20/2004    Years since quitting: 15.6  . Smokeless tobacco: Never Used  Substance Use Topics  . Alcohol use: No    No Known Allergies  Current Facility-Administered Medications  Medication Dose Route Frequency Provider Last Rate Last Admin  . (feeding supplement) PROSource Plus liquid 30 mL  30 mL Oral BID BM Rizwan, Saima, MD   30 mL at 10/23/19 1402  . acetaminophen (TYLENOL) tablet 650 mg  650 mg Oral Q6H PRN Clarnce Flock, MD       Or  . acetaminophen (TYLENOL) suppository 650 mg  650 mg Rectal Q6H PRN Clarnce Flock, MD      . brinzolamide (AZOPT) 1 % ophthalmic suspension 1 drop  1 drop Both Eyes TID Clarnce Flock, MD   1 drop at 10/23/19 1406  . Chlorhexidine Gluconate Cloth 2 % PADS 6 each  6 each Topical Daily Clarnce Flock, MD   6 each at 10/23/19 [phone]  . diltiazem (CARDIZEM CD) 24 hr capsule 180 mg  180 mg Oral Daily Clarnce Flock, MD   180 mg at 10/23/19 1696  . feeding supplement (KATE FARMS STANDARD 1.4) liquid 325 mL  325 mL Oral BID BM Rizwan, Saima, MD   325 mL at 10/23/19 1057   . insulin aspart (novoLOG) injection 0-6 Units  0-6 Units Subcutaneous TID WC Clarnce Flock, MD      . ipratropium-albuterol (DUONEB) 0.5-2.5 (3) MG/3ML nebulizer solution 3 mL  3 mL Nebulization Q6H PRN Clarnce Flock, MD      . latanoprost (XALATAN) 0.005 % ophthalmic solution 1 drop  1 drop Both Eyes QHS Clarnce Flock, MD   1 drop at 10/22/19 2334  . MEDLINE mouth rinse  15 mL Mouth Rinse BID Clarnce Flock, MD   15 mL at 10/23/19 7893  . mirtazapine (REMERON) tablet 7.5 mg  7.5 mg Oral Daily Clarnce Flock, MD   7.5 mg at 10/23/19 8101  . multivitamin with minerals tablet 1 tablet  1 tablet Oral Daily Debbe Odea, MD   1 tablet at 10/23/19 1056  . nystatin (MYCOSTATIN) 100000 UNIT/ML suspension 500,000 Units  5 mL Oral QID Maryanna Shape, NP   500,000 Units at 10/23/19 1405  . ondansetron (ZOFRAN) tablet 4 mg  4 mg Oral Q6H PRN Clarnce Flock, MD       Or  . ondansetron [facility]) injection 4 mg  4 mg Intravenous Q6H PRN Clarnce Flock, MD      . pantoprazole (PROTONIX) EC tablet 80 mg  80 mg Oral Q1200 Clarnce Flock, MD   80 mg at 10/23/19 1056  . polyethylene glycol (MIRALAX / GLYCOLAX) packet 17 g  17 g Oral Daily PRN Clarnce Flock, MD      . pravastatin (PRAVACHOL) tablet 20 mg  20 mg Oral q1800 Clarnce Flock, MD      . predniSONE (DELTASONE) tablet 10 mg  10 mg Oral Q breakfast Clarnce Flock, MD   10 mg at 10/23/19 7510  . sodium chloride flush (NS) 0.9 % injection 10-40 mL  10-40 mL Intracatheter PRN Clarnce Flock, MD      . traZODone (DESYREL) tablet 50 mg  50 mg Oral QHS PRN Clarnce Flock, MD       Facility-Administered Medications Ordered in Other Encounters  Medication Dose Route Frequency Provider Last Rate Last Admin  .  sodium chloride flush (NS) 0.9 % injection 10 mL  10 mL Intravenous PRN Curt Bears, MD      . sodium chloride flush (NS) 0.9 % injection 10 mL  10 mL Intravenous PRN Curt Bears, MD   10 mL  at 12/31/18 1348    REVIEW OF SYSTEMS:  [Reviewed in her chart with nothing to add  PHYSICAL EXAM: Vitals:   10/23/19 0026 10/23/19 0404 10/23/19 0912 10/23/19 1352  BP: (!) 111/56 120/60 (!) 110/55 98/69  Pulse: 84 89 92 86  Resp: 16 18 18 18   Temp: 99.1 F (37.3 C) 99.6 F (37.6 C) 100 F (37.8 C) 98.6 F (37 C)  TempSrc: Oral Oral  Oral  SpO2: 100% 100% 99% 100%  Weight:      Height:        GENERAL: The patient is a well-nourished female, in no acute distress. The vital signs are documented above. CARDIOVASCULAR: 2+ dorsalis pedis pulses bilaterally with no lower extremity swelling PULMONARY: There is good air exchange  MUSCULOSKELETAL: There are no major deformities or cyanosis. NEUROLOGIC: No focal weakness or paresthesias are detected. SKIN: There are no ulcers or rashes noted. PSYCHIATRIC: The patient has a normal affect.  DATA:  CT abdomen and pelvis in January and most recently were reviewed with no change in her left common femoral venous partial occlusion thrombus.  MEDICAL ISSUES: The patient has been adequately treated for her left common femoral DVT with over 6 months of anticoagulation therapy and feel that it would be safe to stop from the standpoint.  She clearly is at risk for recurrent DVT due to her "hypercoagulable state with her metastatic cancer.  I certainly agree that risk of anticoagulation outweigh her benefit with her ongoing bleeding and anemia.  I feel that she would have marginal benefit from a vena cava filter placement.  With her slight extension of renal vein tumor thrombus extending into her cava would have to consider supra renal stenting.  Feel that again the risk of filter exceed any potential benefit and would simply stop her anticoagulation.  Would only recommend filter if she had evidence of recurrent thrombotic disease.  Patient certainly appears to be in the terminal stages of her cancer and palliative care is appropriate been  consulted    Rosetta Posner, MD [facility] [phone] Pager [phone]

## 2019-10-23 NOTE — Progress Notes (Addendum)
PROGRESS NOTE    Melanie Best   CHE:[id number]  DOB: 04-26-1945  DOA: 10/22/2019 PCP: [name], MD   Brief Narrative:  Melanie Best s a 74 y.o. 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   Prior CT in July>> Enlarging left renal mass compatible renal cell carcinoma. Enlarging renal vein tumor invasion/thrombus which now extends just into the IVC. Stable partially occlusive left common femoral vein DVT.   Subjective: Pain in left flank and abdomen. Poor appetite. Has lost a great deal of weight. Hematuria started in July, has been intermittent and is getting worse. 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  - s/p Nivolumab which was stopped due to disease progression - started on Cabometyx- patient stopped this on her own- on 4/19, told [name] that she wanted to let cancer take it's course w/o treatment - interestingly she presented on 7/9 to [name] to discuss treatment - seen in ED on 7/12 and again on 7/18 for hematuria- CT showed renal cell cancer and partially occlusive DVT in left  femoral vein - not a candidate for treatment per oncology note on 8/4- very  weak, barely eating- [name] mentioned discussing end of life issues but also recommended an IVC filter - will ask for a palliative care consult today to discuss [place]  DVT - Found in 03/26/19 in left common and left deep femoral veins - d/c Xarelto in light of above- I have contacted vascular surgery for an opinion on an IVC filter- I do feel she is close to end of life and at this point would not benefit from any procedures      COPD, very severe  - on chronic O2- stable on chronic Prednisone 10 mg daily    DM 2 - has resolved due to weight loss from cancer    Hyperlipidemia - d/c Mevacor    CKD (chronic kidney disease), stage IIIa - stable    HTN (hypertension)  -cont Cardizem for now   Time spent in minutes: 35 DVT prophylaxis: none Code Status: Full code Family Communication: none Disposition Plan:  Status is: Inpatient  Remains inpatient appropriate because:Inpatient level of care appropriate due to severity of illness   Dispo: The patient is from: Home              Anticipated d/c is to: TBD              Anticipated d/c date is: 2 days              Patient currently is not medically stable  to d/c.   Consultants:   Oncology  Palliative care  Vascular  Procedures:   none Antimicrobials:  Anti-infectives (From admission, onward)   None       Objective: Vitals:   10/22/19 2355 10/23/19 0026 10/23/19 0404 10/23/19 0912  BP: (!) 113/55 (!) 111/56 120/60 (!) 110/55  Pulse: 85 84 89 92  Resp: 18 16 18 18   Temp: 98.6 F (37 C) 99.1 F (37.3 C) 99.6 F (37.6 C) 100 F (37.8 C)  TempSrc: Oral Oral Oral   SpO2: 100% 100% 100% 99%  Weight:      Height:        Intake/Output Summary (Last 24 hours) at 10/23/2019 1348 Last data filed at 10/23/2019 0600 Gross per 24 hour  Intake 521.8 ml  Output --  Net 521.8 ml   Filed Weights   10/22/19 2313  Weight: 51.6 kg    Examination: General exam: Appears comfortable  HEENT: PERRLA, oral mucosa moist,  no sclera icterus or thrush Respiratory system: Clear to auscultation. Respiratory effort normal. Cardiovascular system: S1 & S2 heard, RRR.   Gastrointestinal system: Abdomen soft, non-tender, nondistended. Normal bowel sounds. Central nervous system: Alert and oriented. No focal neurological deficits. Extremities: No cyanosis, clubbing or edema Skin: No rashes or ulcers Psychiatry:  Flat affect    Data Reviewed: I have personally reviewed following labs and imaging studies  CBC: Recent Labs  Lab 10/22/19 1412 10/22/19 1751 10/23/19 0543  WBC 13.6*  --  11.7*  NEUTROABS 12.0*  --   --   HGB 6.3* 6.0* 9.3*  HCT 22.2* 21.0* 29.7*  MCV 102.8*  --  93.7  PLT 146*  --  258*   Basic Metabolic Panel: Recent Labs  Lab 10/22/19 1412 10/23/19 0543  NA 140 137  K 3.6 3.8  CL 107 106  CO2 22 20*  GLUCOSE 104* 103*  BUN 33* 25*  CREATININE 1.58* 1.30*  CALCIUM 8.8* 7.9*   GFR: Estimated Creatinine Clearance: 31.9 mL/min (A) (by C-G formula based on SCr of 1.3 mg/dL (H)). Liver Function Tests: Recent Labs  Lab 10/22/19 1412 10/23/19 0543  AST 47* 101*  ALT 61* 94*  ALKPHOS 130* 221*  BILITOT 0.5 0.9  PROT 6.1* 5.6*  ALBUMIN 3.6 3.0*   No results for input(s): LIPASE, AMYLASE in the last 168 hours. No results for input(s): AMMONIA in the last 168 hours. Coagulation Profile: Recent Labs  Lab 10/23/19 0543  INR 1.8*   Cardiac Enzymes: No results for input(s): CKTOTAL, CKMB, CKMBINDEX, TROPONINI in the last 168 hours. BNP (last 3 results) No results for input(s): PROBNP in the last 8760 hours. HbA1C: Recent Labs    10/23/19 0543  HGBA1C 5.3   CBG: Recent Labs  Lab 10/22/19 2242 10/23/19 0727 10/23/19 1143  GLUCAP 201* 95 98   Lipid Profile: No results for input(s): CHOL, HDL, LDLCALC, TRIG, CHOLHDL, LDLDIRECT in the last 72 hours. Thyroid Function Tests: No results for input(s): TSH, T4TOTAL, FREET4, T3FREE, THYROIDAB in the last 72 hours. Anemia  Panel: No results for input(s): VITAMINB12, FOLATE, FERRITIN, TIBC, IRON, RETICCTPCT in the last 72 hours. Urine analysis:    Component Value Date/Time   COLORURINE RED (A) 10/22/2019 1852   APPEARANCEUR HAZY (A) 10/22/2019 1852   LABSPEC  10/22/2019 1852    TEST NOT REPORTED DUE TO COLOR INTERFERENCE OF URINE PIGMENT   PHURINE  10/22/2019 1852    TEST NOT REPORTED DUE TO COLOR INTERFERENCE OF URINE PIGMENT   GLUCOSEU (A)  10/22/2019 1852    TEST NOT REPORTED DUE TO COLOR INTERFERENCE OF URINE PIGMENT   HGBUR (A) 10/22/2019 1852    TEST NOT REPORTED DUE TO COLOR INTERFERENCE OF URINE PIGMENT   BILIRUBINUR (A) 10/22/2019 1852    TEST NOT REPORTED DUE TO COLOR INTERFERENCE OF URINE PIGMENT   KETONESUR (A) 10/22/2019 1852    TEST NOT REPORTED DUE TO COLOR INTERFERENCE OF URINE PIGMENT   PROTEINUR (A) 10/22/2019 1852    TEST NOT REPORTED DUE TO COLOR INTERFERENCE OF URINE PIGMENT   UROBILINOGEN 0.2 01/27/2012 1206   NITRITE (A) 10/22/2019 1852    TEST NOT REPORTED DUE TO COLOR INTERFERENCE OF URINE PIGMENT   LEUKOCYTESUR (A) 10/22/2019 1852    TEST NOT REPORTED DUE TO COLOR INTERFERENCE OF URINE PIGMENT   Sepsis Labs: @LABRCNTIP (procalcitonin:4,lacticidven:4) ) Recent Results (from the past 240 hour(s))  SARS Coronavirus 2 by RT PCR (hospital order, performed in [place] hospital lab) Nasopharyngeal Nasopharyngeal Swab     Status: None   Collection Time: 10/22/19  6:53 PM   Specimen: Nasopharyngeal Swab  Result Value Ref Range Status   SARS Coronavirus 2 NEGATIVE NEGATIVE Final    Comment: (NOTE) SARS-CoV-2 target nucleic acids are NOT DETECTED.  The SARS-CoV-2 RNA is generally detectable in upper and lower respiratory specimens during the acute phase of infection. The lowest concentration of SARS-CoV-2 viral copies this assay can detect is 250 copies / mL. A negative result does not preclude SARS-CoV-2 infection and should not be used as the sole basis for treatment or  other patient management decisions.  A negative result may occur with improper specimen collection / handling, submission of specimen other than nasopharyngeal swab, presence of viral mutation(s) within the areas targeted by this assay, and inadequate number of viral copies (<250 copies / mL). A negative result must be combined with clinical observations, patient history, and epidemiological information.  Fact Sheet for Patients:   StrictlyIdeas.no  Fact Sheet for Healthcare Providers: BankingDealers.co.za  This test is not yet approved or  cleared by the [place] FDA and has been authorized for detection and/or diagnosis of SARS-CoV-2 by FDA under an Emergency Use Authorization (EUA).  This EUA will remain in effect (meaning this test can be used) for the duration of the COVID-19 declaration under Section 564(b)(1) of the Act, 21 U.S.C. section 360bbb-3(b)(1), unless the authorization is terminated or revoked sooner.  Performed at [facility], [place] [address], [place], [place] [address]          Radiology Studies: No results found.    Scheduled Meds: . (feeding supplement) PROSource Plus  30 mL Oral BID BM  . brinzolamide  1 drop Both Eyes TID  . Chlorhexidine Gluconate Cloth  6 each Topical Daily  . diltiazem  180 mg Oral Daily  . feeding supplement (KATE FARMS STANDARD 1.4)  325 mL Oral BID BM  . insulin aspart  0-6 Units Subcutaneous TID WC  . latanoprost  1 drop Both Eyes QHS  . mouth rinse  15 mL Mouth Rinse BID  . mirtazapine  7.5 mg Oral Daily  . multivitamin with minerals  1 tablet Oral Daily  . nystatin  5 mL Oral QID  . pantoprazole  80 mg Oral Q1200  . pravastatin  20 mg Oral q1800  . predniSONE  10 mg Oral Q breakfast   Continuous Infusions:   LOS: 1 day      Debbe Odea, MD [facility] Pager: [email or web address] 10/23/2019, 1:48 PM

## 2019-10-23 NOTE — Progress Notes (Signed)
Initial Nutrition Assessment  RD working remotely.  DOCUMENTATION CODES:   Underweight  INTERVENTION:  - will order [employer] 1.4 po BID, each supplement provides 455 kcal and 20 grams protein. - will order 45 ml Prosource Plus BID, each supplement provides 100 kcal and 15 grams protein. - will order 1 tablet multivitamin with minerals/day. - will complete NFPE at follow-up.   NUTRITION DIAGNOSIS:   Increased nutrient needs related to acute illness, cancer and cancer related treatments, chronic illness as evidenced by estimated needs.  GOAL:   Patient will meet greater than or equal to 90% of their needs  MONITOR:   PO intake, Supplement acceptance, Labs, Weight trends  REASON FOR ASSESSMENT:   Malnutrition Screening Tool    ASSESSMENT:   74 y.o. female with severe COPD on home O2, L renal cell carcinoma with invasion into the L renal vein, IVC with associated thrombus so on Xarelto, and type 2 DM. She presented to the Oncologist's office and was noted to have acute anemia. Oncologist concerned for possible vaginal bleeding and possible need for XRT to the pelvic area.  No intakes documented since admission. Weight yesterday was 114 lb and weight on 09/26/19 was 118 lb. This indicates 4 lb weight loss (3.4% body weight) in the past 1 month; not significant for time frame.  Patient was last assessed by a [place] ([place] on 06/10/19. At that time, appetite and taste were improving and she was drinking Boost Shakes, typically BID. She was to have a follow-up appointment with RD on 07/15/19, but it does not appear that this occurred.   Per notes: - acute blood loss anemia - renal cell carcinoma with blood in urine (not from vagina) - ongoing poor/decreased appetite with floor staff encouraging intakes    Labs reviewed; CBG: 95 mg/dl, BUN: 25 mg/dl, creatinine: 1.3 mg/dl, Ca: 7.9 mg/dl, Alk Phos elevated, LFTs elevated, GFR: 47 ml/min. Medications reviewed;  sliding scale novolog, 5 ml mycostatin QID, 80 mg oral protonix BID, 10 mg deltasone/day.     NUTRITION - FOCUSED PHYSICAL EXAM:  unable to complete at this time.   Diet Order:   Diet Order            Diet regular Room service appropriate? Yes; Fluid consistency: Thin  Diet effective now                 EDUCATION NEEDS:   Not appropriate for education at this time  Skin:  Skin Assessment: Reviewed RN Assessment  Last BM:  8/4  Height:   Ht Readings from Last 1 Encounters:  10/22/19 '5\' 6"'  (1.676 m)    Weight:   Wt Readings from Last 1 Encounters:  10/22/19 51.6 kg     Estimated Nutritional Needs:  Kcal:  1800-2000 kcal Protein:  85-100 grams Fluid:  >/= 2 L/day     Jarome Matin, MS, RD, LDN, CNSC Inpatient Clinical Dietitian RD pager # available in [facility]  After hours/weekend pager # available in [facility]

## 2019-10-23 NOTE — Plan of Care (Signed)
  Problem: Nutrition: Goal: Adequate nutrition will be maintained Outcome: Progressing  Pt reports better appetite today

## 2019-10-23 NOTE — Consult Note (Addendum)
Melanie Best  [phone] Fax:(336) [place]  Reason for Consultation: Anemia, metastatic renal cell carcinoma  HPI: Melanie Best is a 74 year old female with past medical history of severe COPD maintained on home oxygen, metastatic renal cell carcinoma with invasion into the left renal vein and IVC with associated thrombus, diabetes type 2.  The patient was seen at the cancer center 10/22/2019 and reported vaginal bleeding and was noted to have a hemoglobin of 6.3.  She was sent to the emergency room for admission.  The patient has been given 2 units PRBCs.  No family at the bedside at time my visit.  This morning, the patient reports that she is feeling somewhat better after the blood transfusion.  She reports ongoing vaginal bleeding which she says is unchanged.  States that the amount is relatively small however.  She denies headaches and dizziness.  Denies chest pain and shortness of breath but reports a nonproductive cough.  No hemoptysis reported.  Denies abdominal pain, nausea, vomiting.  Reported that she had diarrhea yesterday but none today.  She has not noticed any other bleeding other than the vaginal bleeding.  Medical oncology was asked see the patient for recommendations regarding her metastatic renal cell carcinoma and anemia.     Past Medical History:  Diagnosis Date  . Anemia   . Anxiety   . Arthritis    R leg  . Cancer ([place])    kidney  . Chronic fatigue 12/13/2016  . CKD (chronic kidney disease), stage II    removed R kidney- 2010- for Cancer, followed by Dr. Diona Fanti  . COPD (chronic obstructive pulmonary disease) (HCC)    uses O2-2 liters , continuously , followed by Dr. Chase Caller  . Cough 08-02-2015  . Diabetes mellitus, type 2 ([place])   . DM (diabetes mellitus) ([place]) 01/27/2012  . DVT (deep venous thrombosis), left    2011- treated /w coumadin   . Dyslipidemia   . Dysrhythmia   . Esophageal reflux   . GERD  (gastroesophageal reflux disease) 01/27/2012  . Glaucoma   . Goals of care, counseling/discussion 12/13/2016  . Hemoptysis 08-02-15  . History of radiation therapy 01/09/12,01/11/12,01/15/12,01/17/12,& 01/19/12   rul lung,50Gy/47fx  . HTN (hypertension) 01/27/2012  . Hyperlipidemia 01/27/2012  . Hypertension    sees Dr. Deforest Hoyles for PCP, denies ever having stress or ECHO, ?when & where she had  last  EKG  . Leukocytosis   . Lung cancer ([place])    NSCLC  . Osteopenia   . Renal cell carcinoma of right kidney ([place]) 12/13/2016  . Shortness of breath   . Syncope   . Tremor   . Urinary incontinence   . Vitamin D deficiency   :  Past Surgical History:  Procedure Laterality Date  . BACK SURGERY     for pinched nerve, 2011, here at [facility]  . EYE SURGERY     cataracts removed, ?IOL  . FLEXIBLE BRONCHOSCOPY  11/29/2011  . IR FLUORO GUIDE PORT INSERTION RIGHT  01/30/2017  . IR US GUIDE VASC ACCESS RIGHT  01/30/2017  . removed blood clot     left upper abd quad   . right kidney removed    . right nephrectomy    :  Current Facility-Administered Medications  Medication Dose Route Frequency Provider Last Rate Last Admin  . acetaminophen (TYLENOL) tablet 650 mg  650 mg Oral Q6H PRN Clarnce Flock, MD       Or  .  acetaminophen (TYLENOL) suppository 650 mg  650 mg Rectal Q6H PRN Clarnce Flock, MD      . brinzolamide (AZOPT) 1 % ophthalmic suspension 1 drop  1 drop Both Eyes TID Clarnce Flock, MD      . Chlorhexidine Gluconate Cloth 2 % PADS 6 each  6 each Topical Daily Clarnce Flock, MD      . diltiazem (CARDIZEM CD) 24 hr capsule 180 mg  180 mg Oral Daily Clarnce Flock, MD      . feeding supplement (ENSURE ENLIVE) (ENSURE ENLIVE) liquid 237 mL  237 mL Oral BID BM Clarnce Flock, MD      . insulin aspart (novoLOG) injection 0-6 Units  0-6 Units Subcutaneous TID WC Clarnce Flock, MD      . ipratropium-albuterol (DUONEB) 0.5-2.5 (3) MG/3ML nebulizer solution 3 mL  3 mL  Nebulization Q6H PRN Clarnce Flock, MD      . latanoprost (XALATAN) 0.005 % ophthalmic solution 1 drop  1 drop Both Eyes QHS Clarnce Flock, MD   1 drop at 10/22/19 2334  . MEDLINE mouth rinse  15 mL Mouth Rinse BID Clarnce Flock, MD      . mirtazapine (REMERON) tablet 7.5 mg  7.5 mg Oral Daily Clarnce Flock, MD   7.5 mg at 10/22/19 2209  . ondansetron (ZOFRAN) tablet 4 mg  4 mg Oral Q6H PRN Clarnce Flock, MD       Or  . ondansetron [facility]) injection 4 mg  4 mg Intravenous Q6H PRN Clarnce Flock, MD      . pantoprazole (PROTONIX) EC tablet 80 mg  80 mg Oral Q1200 Clarnce Flock, MD      . polyethylene glycol (MIRALAX / GLYCOLAX) packet 17 g  17 g Oral Daily PRN Clarnce Flock, MD      . pravastatin (PRAVACHOL) tablet 20 mg  20 mg Oral q1800 Clarnce Flock, MD      . predniSONE (DELTASONE) tablet 10 mg  10 mg Oral Q breakfast Clarnce Flock, MD      . sodium chloride flush (NS) 0.9 % injection 10-40 mL  10-40 mL Intracatheter PRN Clarnce Flock, MD      . traZODone (DESYREL) tablet 50 mg  50 mg Oral QHS PRN Clarnce Flock, MD       Facility-Administered Medications Ordered in Other Encounters  Medication Dose Route Frequency Provider Last Rate Last Admin  . sodium chloride flush (NS) 0.9 % injection 10 mL  10 mL Intravenous PRN Curt Bears, MD      . sodium chloride flush (NS) 0.9 % injection 10 mL  10 mL Intravenous PRN Curt Bears, MD   10 mL at 12/31/18 1348     No Known Allergies:  Family History  Problem Relation Age of Onset  . Emphysema Father   . Cancer Sister        behind eye  . Cancer Brother        lung  . Cancer Brother        lung  . Cancer Brother        throat  . Breast cancer Neg Hx   :  Social History   Socioeconomic History  . Marital status: Married    Spouse name: Not on file  . Number of children: Not on file  . Years of education: Not on file  . Highest education level: Not on file   Occupational  History  . Not on file  Tobacco Use  . Smoking status: Former Smoker    Packs/day: 1.00    Years: 35.00    Pack years: 35.00    Types: Cigarettes    Quit date: 03/20/2004    Years since quitting: 15.6  . Smokeless tobacco: Never Used  Vaping Use  . Vaping Use: Never used  Substance and Sexual Activity  . Alcohol use: No  . Drug use: No  . Sexual activity: Not on file  Other Topics Concern  . Not on file  Social History Narrative  . Not on file   Social Determinants of Health   Financial Resource Strain:   . Difficulty of Paying Living Expenses:   Food Insecurity:   . Worried About Charity fundraiser in the Last Year:   . Arboriculturist in the Last Year:   Transportation Needs:   . Film/video editor (Medical):   Marland Kitchen Lack of Transportation (Non-Medical):   Physical Activity:   . Days of Exercise per Week:   . Minutes of Exercise per Session:   Stress:   . Feeling of Stress :   Social Connections:   . Frequency of Communication with Friends and Family:   . Frequency of Social Gatherings with Friends and Family:   . Attends Religious Services:   . Active Member of Clubs or Organizations:   . Attends Archivist Meetings:   Marland Kitchen Marital Status:   Intimate Partner Violence:   . Fear of Current or Ex-Partner:   . Emotionally Abused:   Marland Kitchen Physically Abused:   . Sexually Abused:   :  Review of Systems: A comprehensive 14 point review of systems was negative except as noted in the HPI.  Exam: Patient Vitals for the past 24 hrs:  BP Temp Temp src Pulse Resp SpO2 Height Weight  10/23/19 0404 120/60 99.6 F (37.6 C) Oral 89 18 100 % -- --  10/23/19 0026 (!) 111/56 99.1 F (37.3 C) Oral 84 16 100 % -- --  10/22/19 2355 (!) 113/55 98.6 F (37 C) Oral 85 18 100 % -- --  10/22/19 2343 (!) 111/54 98.5 F (36.9 C) Oral 84 18 100 % -- --  10/22/19 2313 -- -- -- -- -- -- 5\' 6"  (1.676 m) 51.6 kg  10/22/19 2244 (!) 110/56 98.3 F (36.8 C) Oral 89 18  100 % -- --  10/22/19 2200 (!) 111/52 99.3 F (37.4 C) Oral 86 (!) 22 100 % -- --  10/22/19 2145 (!) 107/52 -- -- 86 20 100 % -- --  10/22/19 2130 (!) 103/51 -- -- 90 19 100 % -- --  10/22/19 2115 (!) 107/55 -- -- 90 19 100 % -- --  10/22/19 2049 (!) 109/52 -- -- 92 16 100 % -- --  10/22/19 2043 (!) 100/58 98.7 F (37.1 C) Oral 95 20 -- -- --  10/22/19 2042 (!) 100/58 -- -- 95 20 100 % -- --  10/22/19 1930 (!) 114/56 -- -- 94 20 100 % -- --  10/22/19 1717 (!) 117/58 97.9 F (36.6 C) Oral 96 13 100 % -- --    General: Chronically ill-appearing female, no distress.   Eyes:  no scleral icterus.   ENT: Ritta Slot noted to her tongue, no mucositis   Respiratory: Scattered, faint expiratory wheezes.   Cardiovascular:  Regular rate and rhythm, S1/S2, without murmur, rub or gallop.  There was no pedal edema.   GI:  abdomen  was soft, flat, nontender, nondistended, without organomegaly.   Musculoskeletal:  no spinal tenderness of palpation of vertebral spine.   Skin exam was without echymosis, petichae.   Neuro exam was nonfocal. Patient was alert and oriented.  Attention was good. Language was appropriate.  Mood was normal without depression.  Speech was not pressured.  Thought content was not tangential.     Lab Results  Component Value Date   WBC 11.7 (H) 10/23/2019   HGB 9.3 (L) 10/23/2019   HCT 29.7 (L) 10/23/2019   PLT 108 (L) 10/23/2019   GLUCOSE 103 (H) 10/23/2019   CHOL 177 01/28/2012   TRIG 234 (H) 01/28/2012   HDL 54 01/28/2012   LDLCALC 76 01/28/2012   ALT 94 (H) 10/23/2019   AST 101 (H) 10/23/2019   NA 137 10/23/2019   K 3.8 10/23/2019   CL 106 10/23/2019   CREATININE 1.30 (H) 10/23/2019   BUN 25 (H) 10/23/2019   CO2 20 (L) 10/23/2019    CT ABDOMEN PELVIS W CONTRAST  Result Date: 09/29/2019 CLINICAL DATA:  Hematuria.  History of renal cell carcinoma EXAM: CT ABDOMEN AND PELVIS WITH CONTRAST TECHNIQUE: Multidetector CT imaging of the abdomen and pelvis was performed  using the standard protocol following bolus administration of intravenous contrast. CONTRAST:  19mL OMNIPAQUE IOHEXOL 300 MG/ML  SOLN COMPARISON:  03/26/2019 FINDINGS: Lower chest: Nodules in the left lower lobe have enlarged since prior study, measuring up to 2.1 cm on the 1st image. This measured approximately 1.4 cm previously. No effusions. Hepatobiliary: No focal hepatic abnormality. Small layering stones within the gallbladder. No biliary ductal dilatation. Pancreas: No focal abnormality or ductal dilatation. Spleen: No focal abnormality.  Normal size. Adrenals/Urinary Tract: Large mass involving the mid and lower pole of the left kidney measures 7.8 x 7.3 cm and extends over a craniocaudal length of approximately 10.3 cm. This is enlarged from previous study when this measured 5.9 x 6.2 x 4.7 cm. Left renal vein invasion with tumor thrombus extending through the left renal vein and extending just into the IVC. This has progressed since prior study. Prior right nephrectomy. Adrenal glands and urinary bladder unremarkable. Stomach/Bowel: Stomach, large and small bowel grossly unremarkable. Vascular/Lymphatic: Aortic atherosclerosis. No enlarged abdominal or pelvic lymph nodes. Stable partially occlusive DVT in the left common femoral vein. Reproductive: No mass. Other: No free fluid or free air. Musculoskeletal: No acute bony abnormality. IMPRESSION: Enlarging left renal mass compatible renal cell carcinoma. Enlarging renal vein tumor invasion/thrombus which now extends just into the IVC. Enlarging clustered peribronchovascular nodules in the left lower lobe. Aortic atherosclerosis. Stable partially occlusive left common femoral vein DVT. Layering gallstones within the gallbladder. Electronically Signed   By: Rolm Baptise M.D.   On: 09/29/2019 08:29     CT ABDOMEN PELVIS W CONTRAST  Result Date: 09/29/2019 CLINICAL DATA:  Hematuria.  History of renal cell carcinoma EXAM: CT ABDOMEN AND PELVIS WITH  CONTRAST TECHNIQUE: Multidetector CT imaging of the abdomen and pelvis was performed using the standard protocol following bolus administration of intravenous contrast. CONTRAST:  34mL OMNIPAQUE IOHEXOL 300 MG/ML  SOLN COMPARISON:  03/26/2019 FINDINGS: Lower chest: Nodules in the left lower lobe have enlarged since prior study, measuring up to 2.1 cm on the 1st image. This measured approximately 1.4 cm previously. No effusions. Hepatobiliary: No focal hepatic abnormality. Small layering stones within the gallbladder. No biliary ductal dilatation. Pancreas: No focal abnormality or ductal dilatation. Spleen: No focal abnormality.  Normal size. Adrenals/Urinary Tract: Large mass involving  the mid and lower pole of the left kidney measures 7.8 x 7.3 cm and extends over a craniocaudal length of approximately 10.3 cm. This is enlarged from previous study when this measured 5.9 x 6.2 x 4.7 cm. Left renal vein invasion with tumor thrombus extending through the left renal vein and extending just into the IVC. This has progressed since prior study. Prior right nephrectomy. Adrenal glands and urinary bladder unremarkable. Stomach/Bowel: Stomach, large and small bowel grossly unremarkable. Vascular/Lymphatic: Aortic atherosclerosis. No enlarged abdominal or pelvic lymph nodes. Stable partially occlusive DVT in the left common femoral vein. Reproductive: No mass. Other: No free fluid or free air. Musculoskeletal: No acute bony abnormality. IMPRESSION: Enlarging left renal mass compatible renal cell carcinoma. Enlarging renal vein tumor invasion/thrombus which now extends just into the IVC. Enlarging clustered peribronchovascular nodules in the left lower lobe. Aortic atherosclerosis. Stable partially occlusive left common femoral vein DVT. Layering gallstones within the gallbladder. Electronically Signed   By: Rolm Baptise M.D.   On: 09/29/2019 08:29   Assessment and Plan:  1.  Metastatic renal cell carcinoma with invasion  into the left renal vein and IVC with associated thrombus 2.  Anemia secondary to vaginal bleeding 3.  DVT of the left common femoral vein 4.  Glaucoma 5.  COPD 6.  HTN 7. Type 2 diabetes mellitus 8. Hyperlipidemia 9.  Insomnia 10. GERD 11.  Oral candidiasis  -Unfortunately, Melanie Best has had disease progression of her metastatic renal cell carcinoma.  Based on her current performance status, she is not a candidate for any additional therapy.  We will have additional discussions with the patient regarding goals of care and CODE STATUS.  Palliative care has been consulted and hopefully can also assist with this. -Hemoglobin has improved following blood transfusion.  Recommend close monitoring of CBC and transfuse if hemoglobin is less than 8. -Continue to hold Xarelto due to bleeding.  Recommend IVC filter placement. -Management of chronic medical conditions per hospitalist -She is noted to have oral candidiasis on exam today and I have started her on nystatin swish and swallow 4 times a day.    Mikey Bussing, DNP, AGPCNP-BC, AOCNP  ADDENDUM: I saw and examined Melanie Best this morning.  We had a nice long talk about her situation.  She clearly is declining.  I really suspect that her prognosis is going to be very limited.  She is not a candidate for any additional therapy for her renal cell carcinoma.  She understands this.  She is still having bleeding.  Is hard to say where the bleeding is coming from.  It might be coming from the bladder.  It can be coming from the renal mass in the left kidney.  There are no labs yet back today.  I suspect that we might be looking at a brief course of radiation therapy to try to stop the bleeding.  I did discuss end-of-life issues with Melanie Best.  She wanted to know how much longer I thought she had.  I told her that I felt that she likely had only 4 weeks at the most.  I told her that I did nothing that she was going to make it through the month of  August.  She understood this.  I do not think this was much of a surprise to her.  We talked about extending her life with artificial support.  She DOES NOT want to be kept alive on machines.  She wants to be kept comfortable.  This we can certainly do.  I totally agree that put her on life support would not improve her quality of life and would actually cause suffering.  As such, she is a DO NOT RESUSCITATE.  I will get her on a Duragesic patch.  She is having some pain on the left side.  I suspect this is probably from where she has had the malignancy.  I think a Duragesic patch would really be helpful.  I did talk to her about Hospice.  I explained her what Hospice was all about.  I told her that Hospice does not cost anything.  They try to keep the patient's at home.  Most importantly, I told her that people who are on hospice who have cancer, tend to live longer.  She is willing to meet with Hospice.  We will await this when she gets home.  I know that Ms. Kirchman has tried her best.  She just has a very aggressive tumor that has been very difficult to treat.  She does look a little bit better than when I saw her in the office on Wednesday.  I think that the blood helped her out.  I will call Radiation Oncology and see if they can see her and maybe institute some palliative radiation over a very brief course.  I very much appreciate the outstanding care that she is getting from the staff on [address] [facility].  I know that the staff on [address] [facility]. or so compassionate and caring.  Melanie Haw, MD  1 Thessalonians 5:11

## 2019-10-24 ENCOUNTER — Ambulatory Visit
Admit: 2019-10-24 | Discharge: 2019-10-24 | Disposition: A | Discharge status: Home / Self Care | Payer: Medicare Other | Attending: Radiation Oncology | Admitting: Radiation Oncology

## 2019-10-24 ENCOUNTER — Ambulatory Visit
Admit: 2019-10-24 | Discharge: 2019-10-24 | Disposition: A | Discharge status: Home / Self Care | Payer: Medicare Other | Source: Ambulatory Visit | Attending: Radiation Oncology | Admitting: Radiation Oncology

## 2019-10-24 DIAGNOSIS — Z515 Encounter for palliative care: Secondary | ICD-10-CM

## 2019-10-24 DIAGNOSIS — Z7189 Other specified counseling: Secondary | ICD-10-CM

## 2019-10-24 DIAGNOSIS — R310 Gross hematuria: Secondary | ICD-10-CM

## 2019-10-24 DIAGNOSIS — C642 Malignant neoplasm of left kidney, except renal pelvis: Secondary | ICD-10-CM | POA: Insufficient documentation

## 2019-10-24 DIAGNOSIS — R531 Weakness: Secondary | ICD-10-CM

## 2019-10-24 LAB — GLUCOSE, CAPILLARY
Glucose-Capillary: 112 mg/dL — ABNORMAL HIGH (ref 70–99)
Glucose-Capillary: 121 mg/dL — ABNORMAL HIGH (ref 70–99)
Glucose-Capillary: 219 mg/dL — ABNORMAL HIGH (ref 70–99)
Glucose-Capillary: 131 mg/dL — ABNORMAL HIGH (ref 70–99)

## 2019-10-24 LAB — CBC WITH DIFFERENTIAL/PLATELET
Abs Immature Granulocytes: 0.07 10*3/uL (ref 0.00–0.07)
Basophils Absolute: 0 10*3/uL (ref 0.0–0.1)
Basophils Relative: 0 %
Eosinophils Absolute: 0 10*3/uL (ref 0.0–0.5)
Eosinophils Relative: 0 %
HCT: 29.6 % — ABNORMAL LOW (ref 36.0–46.0)
Hemoglobin: 9.1 g/dL — ABNORMAL LOW (ref 12.0–15.0)
Immature Granulocytes: 1 %
Lymphocytes Relative: 10 %
Lymphs Abs: 1.1 10*3/uL (ref 0.7–4.0)
MCH: 29 pg (ref 26.0–34.0)
MCHC: 30.7 g/dL (ref 30.0–36.0)
MCV: 94.3 fL (ref 80.0–100.0)
Monocytes Absolute: 0.8 10*3/uL (ref 0.1–1.0)
Monocytes Relative: 7 %
Neutro Abs: 9.6 10*3/uL — ABNORMAL HIGH (ref 1.7–7.7)
Neutrophils Relative %: 82 %
Platelets: 122 10*3/uL — ABNORMAL LOW (ref 150–400)
RBC: 3.14 MIL/uL — ABNORMAL LOW (ref 3.87–5.11)
RDW: 17.9 % — ABNORMAL HIGH (ref 11.5–15.5)
WBC: 11.6 10*3/uL — ABNORMAL HIGH (ref 4.0–10.5)
nRBC: 0.3 % — ABNORMAL HIGH (ref 0.0–0.2)

## 2019-10-24 LAB — COMPREHENSIVE METABOLIC PANEL
ALT: 75 U/L — ABNORMAL HIGH (ref 0–44)
AST: 49 U/L — ABNORMAL HIGH (ref 15–41)
Albumin: 2.6 g/dL — ABNORMAL LOW (ref 3.5–5.0)
Alkaline Phosphatase: 173 U/L — ABNORMAL HIGH (ref 38–126)
Anion gap: 9 (ref 5–15)
BUN: 26 mg/dL — ABNORMAL HIGH (ref 8–23)
CO2: 22 mmol/L (ref 22–32)
Calcium: 7.6 mg/dL — ABNORMAL LOW (ref 8.9–10.3)
Chloride: 109 mmol/L (ref 98–111)
Creatinine, Ser: 1.3 mg/dL — ABNORMAL HIGH (ref 0.44–1.00)
GFR calc Af Amer: 47 mL/min — ABNORMAL LOW (ref >60)
GFR calc non Af Amer: 41 mL/min — ABNORMAL LOW (ref >60)
Glucose, Bld: 101 mg/dL — ABNORMAL HIGH (ref 70–99)
Potassium: 3.6 mmol/L (ref 3.5–5.1)
Sodium: 140 mmol/L (ref 135–145)
Total Bilirubin: 0.6 mg/dL (ref 0.3–1.2)
Total Protein: 5.5 g/dL — ABNORMAL LOW (ref 6.5–8.1)

## 2019-10-24 LAB — IRON AND TIBC
Iron: 16 ug/dL — ABNORMAL LOW (ref 28–170)
Saturation Ratios: 7 % — ABNORMAL LOW (ref 10.4–31.8)
TIBC: 238 ug/dL — ABNORMAL LOW (ref 250–450)
UIBC: 222 ug/dL

## 2019-10-24 LAB — PREALBUMIN: Prealbumin: 12.3 mg/dL — ABNORMAL LOW (ref 18–38)

## 2019-10-24 LAB — FERRITIN: Ferritin: 153 ng/mL (ref 11–307)

## 2019-10-24 MED ORDER — MORPHINE SULFATE 10 MG/5ML PO SOLN
5.0000 mg | ORAL | Status: DC | PRN
  Administered 2019-10-24 – 2019-10-25 (×3): 5 mg via ORAL
  Filled 2019-10-24 (×3): qty 5

## 2019-10-24 MED ORDER — FENTANYL 12 MCG/HR TD PT72
1.0000 | MEDICATED_PATCH | TRANSDERMAL | Status: DC
  Administered 2019-10-24: 1 via TRANSDERMAL
  Filled 2019-10-24: qty 1

## 2019-10-24 MED ORDER — SODIUM CHLORIDE 0.9 % IV SOLN
510.0000 mg | Freq: Once | INTRAVENOUS | Status: AC
  Administered 2019-10-24: 510 mg via INTRAVENOUS
  Filled 2019-10-24: qty 510

## 2019-10-24 NOTE — Consult Note (Signed)
Radiation Oncology         [phone] ________________________________  Initial inpatient Consultation  Name: NESHA COUNIHAN MRN: [id number]  Date of Service: 10/22/2019 DOB: 10-05-1945  ZH:[id number], Denton Ar, MD  No ref. provider found   REFERRING PHYSICIAN: No ref. provider found  DIAGNOSIS: The primary encounter diagnosis was Symptomatic anemia. Diagnoses of On rivaroxaban therapy, Gross hematuria, Oral candidiasis, and Renal cell carcinoma of right kidney ([place]) were also pertinent to this visit.    ICD-10-CM   1. Symptomatic anemia  D64.9   2. On rivaroxaban therapy  Z79.01   3. Gross hematuria  R31.0   4. Oral candidiasis  B37.0 nystatin (MYCOSTATIN) 100000 UNIT/ML suspension 500,000 Units  5. Renal cell carcinoma of right kidney (HCC)  C64.1 fentaNYL (DURAGESIC) 12 MCG/HR 1 patch    HISTORY OF PRESENT ILLNESS: LAUNI ASENCIO is a 74 y.o. female seen at the request of Dr. Dr. Marin Olp.  She has a history of right renal cell carcinoma initially diagnosed in 2010 and treated with a radical right nephrectomy under the care and direction of Dr. Diona Fanti.  She later developed a putative Stage IA NSCLC in the right upper lobe in 2013 but was deemed too high risk for biopsy and/or surgery given her significant underlying lung disease with COPD and emphysema.  She went on to be treated with definitive radiotherapy under the care and direction of Dr. Pablo Ledger, with a 5 fraction course of stereotactic body radiation (SBRT) which she tolerated well.  She continued in routine follow-up and developed a putative, metachronus Stage IA NSCLC in the left lower lobe lung in 2017 which was again treated with definitive radiotherapy with a 5 fraction course of SBRT.  Follow-up imaging demonstrated progressive disease so she was referred to Dr. Earlie Server who continued to manage her systemic disease up until recently.  She had a successful CT-guided biopsy of an enlarging right lower lobe lung lesion in  September 2018 which confirmed metastatic renal cell carcinoma.  She was initially treated with first-line combo immunotherapy with nivolumab and ipilimumab, beginning in October 2018 and was then transitioned to maintenance single agent nivolumab in February 2019.  Unfortunately, follow-up imaging indicated disease progression with a new, avidly enhancing, poorly marginated 5.6 cm renal cortical mass in the lower left pole kidney, compatible with a new, primary clear-cell renal cell carcinoma.  Her systemic therapy was changed to Cabometyx in April 2019 and the left renal mass appeared to be responding on follow-up imaging.  She did not tolerate the Cabometyx very well and stopped taking the medication, on her own accord, in April 2021, reporting to Dr. Earlie Server that she did not wish to have further systemic treatment and instead would let the cancer run its course.  She later sought a second opinion with Dr. Marin Olp on September 26, 2019 and the plan was to proceed with repeat imaging and possibly repeat biopsy to update tissue diagnosis and molecular markers to help better guide future systemic treatment.   In the interim, she presented to the emergency department with complaints of gross hematuria on 09/29/2019. CT A/P performed on admission noted significant interval enlargement of the left lower pole renal mass, measuring approximately 10.3 cm in craniocaudal length, previously measuring 6.2 cm.  There appeared to be left renal vein invasion with tumor thrombus extending through the left renal vein and extending just into the IVC which was progressed as compared to her prior study from January 2021.  Her hemoglobin at the time of  a recent outpatient follow-up visit with Dr. Marin Olp on 10/22/2019 was noted at 6.3 so she was advised to present to the emergency department at [facility] for admission and management. The gross hematuria has been persistent and progressively worsening, to the point that she has required  multiple blood transfusions for management of her blood loss anemia recently.  For this reason, we have been consulted for consideration of palliative radiotherapy to the bleeding left renal mass in hopes of at least slowing, possibly stopping the bleeding.  PREVIOUS RADIATION THERAPY: Yes  01/09/2012, 01/11/2012, 01/15/2012, 01/17/2012, 01/19/2012//SBRT: Right upper lobe / 50 Gy at 10 Gy per fraction x 5 fractions   06/24/2013, 06/30/2013, 07/02/2013, 07/04/2013, 07/07/2013//SBRT:  Left lower Lobe/ 60 Gy in 5 fractions at 12 Gy per fraction  PAST MEDICAL HISTORY:  Past Medical History:  Diagnosis Date  . Anemia   . Anxiety   . Arthritis    R leg  . Cancer ([place])    kidney  . Chronic fatigue 12/13/2016  . CKD (chronic kidney disease), stage II    removed R kidney- 2010- for Cancer, followed by Dr. Diona Fanti  . COPD (chronic obstructive pulmonary disease) (HCC)    uses O2-2 liters , continuously , followed by Dr. Chase Caller  . Cough 08-02-2015  . Diabetes mellitus, type 2 ([place])   . DM (diabetes mellitus) ([place]) 01/27/2012  . DVT (deep venous thrombosis), left    2011- treated /w coumadin   . Dyslipidemia   . Dysrhythmia   . Esophageal reflux   . GERD (gastroesophageal reflux disease) 01/27/2012  . Glaucoma   . Goals of care, counseling/discussion 12/13/2016  . Hemoptysis 08-02-15  . History of radiation therapy 01/09/12,01/11/12,01/15/12,01/17/12,& 01/19/12   rul lung,50Gy/32fx  . HTN (hypertension) 01/27/2012  . Hyperlipidemia 01/27/2012  . Hypertension    sees Dr. Deforest Hoyles for PCP, denies ever having stress or ECHO, ?when & where she had  last  EKG  . Leukocytosis   . Lung cancer ([place])    NSCLC  . Osteopenia   . Renal cell carcinoma of right kidney ([place]) 12/13/2016  . Shortness of breath   . Syncope   . Tremor   . Urinary incontinence   . Vitamin D deficiency       PAST SURGICAL HISTORY: Past Surgical History:  Procedure Laterality Date  . BACK SURGERY     for pinched nerve, 2011,  here at [facility]  . EYE SURGERY     cataracts removed, ?IOL  . FLEXIBLE BRONCHOSCOPY  11/29/2011  . IR FLUORO GUIDE PORT INSERTION RIGHT  01/30/2017  . IR US GUIDE VASC ACCESS RIGHT  01/30/2017  . removed blood clot     left upper abd quad   . right kidney removed    . right nephrectomy      FAMILY HISTORY:  Family History  Problem Relation Age of Onset  . Emphysema Father   . Cancer Sister        behind eye  . Cancer Brother        lung  . Cancer Brother        lung  . Cancer Brother        throat  . Breast cancer Neg Hx     SOCIAL HISTORY:  Social History   Socioeconomic History  . Marital status: Married    Spouse name: Not on file  . Number of children: Not on file  . Years of education: Not on file  . Highest education level:  Not on file  Occupational History  . Not on file  Tobacco Use  . Smoking status: Former Smoker    Packs/day: 1.00    Years: 35.00    Pack years: 35.00    Types: Cigarettes    Quit date: 03/20/2004    Years since quitting: 15.6  . Smokeless tobacco: Never Used  Vaping Use  . Vaping Use: Never used  Substance and Sexual Activity  . Alcohol use: No  . Drug use: No  . Sexual activity: Not on file  Other Topics Concern  . Not on file  Social History Narrative  . Not on file   Social Determinants of Health   Financial Resource Strain:   . Difficulty of Paying Living Expenses:   Food Insecurity:   . Worried About Charity fundraiser in the Last Year:   . Arboriculturist in the Last Year:   Transportation Needs:   . Film/video editor (Medical):   Marland Kitchen Lack of Transportation (Non-Medical):   Physical Activity:   . Days of Exercise per Week:   . Minutes of Exercise per Session:   Stress:   . Feeling of Stress :   Social Connections:   . Frequency of Communication with Friends and Family:   . Frequency of Social Gatherings with Friends and Family:   . Attends Religious Services:   . Active Member of Clubs or Organizations:   .  Attends Archivist Meetings:   Marland Kitchen Marital Status:   Intimate Partner Violence:   . Fear of Current or Ex-Partner:   . Emotionally Abused:   Marland Kitchen Physically Abused:   . Sexually Abused:     ALLERGIES: Patient has no known allergies.  MEDICATIONS:  Current Facility-Administered Medications  Medication Dose Route Frequency Provider Last Rate Last Admin  . (feeding supplement) PROSource Plus liquid 30 mL  30 mL Oral BID BM Rizwan, Saima, MD   30 mL at 10/23/19 1402  . acetaminophen (TYLENOL) tablet 650 mg  650 mg Oral Q6H PRN Clarnce Flock, MD       Or  . acetaminophen (TYLENOL) suppository 650 mg  650 mg Rectal Q6H PRN Clarnce Flock, MD      . brinzolamide (AZOPT) 1 % ophthalmic suspension 1 drop  1 drop Both Eyes TID Clarnce Flock, MD   1 drop at 10/24/19 [phone]  . Chlorhexidine Gluconate Cloth 2 % PADS 6 each  6 each Topical Daily Clarnce Flock, MD   6 each at 10/24/19 [phone]  . diltiazem (CARDIZEM CD) 24 hr capsule 180 mg  180 mg Oral Daily Clarnce Flock, MD   180 mg at 10/24/19 0915  . feeding supplement (KATE FARMS STANDARD 1.4) liquid 325 mL  325 mL Oral BID BM Debbe Odea, MD   325 mL at 10/24/19 0918  . fentaNYL (DURAGESIC) 12 MCG/HR 1 patch  1 patch Transdermal Q72H Volanda Napoleon, MD   1 patch at 10/24/19 [phone]  . ferumoxytol (FERAHEME) 510 mg in sodium chloride 0.9 % 100 mL IVPB  510 mg Intravenous Once Rizwan, Eunice Blase, MD      . insulin aspart (novoLOG) injection 0-6 Units  0-6 Units Subcutaneous TID WC Clarnce Flock, MD   1 Units at 10/23/19 1726  . ipratropium-albuterol (DUONEB) 0.5-2.5 (3) MG/3ML nebulizer solution 3 mL  3 mL Nebulization Q6H PRN Clarnce Flock, MD      . latanoprost (XALATAN) 0.005 % ophthalmic solution 1 drop  1  drop Both Eyes QHS Clarnce Flock, MD   1 drop at 10/23/19 2135  . MEDLINE mouth rinse  15 mL Mouth Rinse BID Clarnce Flock, MD   15 mL at 10/24/19 2694  . mirtazapine (REMERON) tablet 7.5 mg  7.5 mg Oral  Daily Clarnce Flock, MD   7.5 mg at 10/24/19 8546  . morphine 10 MG/5ML solution 5 mg  5 mg Oral Q3H PRN Loistine Chance, MD      . multivitamin with minerals tablet 1 tablet  1 tablet Oral Daily Debbe Odea, MD   1 tablet at 10/24/19 0915  . nystatin (MYCOSTATIN) 100000 UNIT/ML suspension 500,000 Units  5 mL Oral QID Maryanna Shape, NP   500,000 Units at 10/24/19 0916  . ondansetron (ZOFRAN) tablet 4 mg  4 mg Oral Q6H PRN Clarnce Flock, MD       Or  . ondansetron [facility]) injection 4 mg  4 mg Intravenous Q6H PRN Clarnce Flock, MD      . pantoprazole (PROTONIX) EC tablet 80 mg  80 mg Oral Q1200 Clarnce Flock, MD   80 mg at 10/23/19 1056  . polyethylene glycol (MIRALAX / GLYCOLAX) packet 17 g  17 g Oral Daily PRN Clarnce Flock, MD      . predniSONE (DELTASONE) tablet 10 mg  10 mg Oral Q breakfast Clarnce Flock, MD   10 mg at 10/24/19 0916  . sodium chloride flush (NS) 0.9 % injection 10-40 mL  10-40 mL Intracatheter PRN Clarnce Flock, MD      . traZODone (DESYREL) tablet 50 mg  50 mg Oral QHS PRN Clarnce Flock, MD       Facility-Administered Medications Ordered in Other Encounters  Medication Dose Route Frequency Provider Last Rate Last Admin  . sodium chloride flush (NS) 0.9 % injection 10 mL  10 mL Intravenous PRN Curt Bears, MD      . sodium chloride flush (NS) 0.9 % injection 10 mL  10 mL Intravenous PRN Curt Bears, MD   10 mL at 12/31/18 1348    REVIEW OF SYSTEMS:  On review of systems, the patient reports that she is doing well overall. She currently denies any chest pain, productive cough, hemoptysis, fevers, chills or night sweats. She reports that she feels much better since having 2 units PRBCs during admission. She has chronic shortness of breath, unchanged recently but has had an unintentional weight loss of approximately 5 pounds in the last 3 to 4 weeks.  She has continued with gross hematuria and is unsure as to whether she is  having any vaginal bleeding at this point.  She denies any bowel or bladder incontinence, and denies abdominal pain, nausea or vomiting. She denies any new musculoskeletal or joint aches or pains. A complete review of systems is obtained and is otherwise negative.    PHYSICAL EXAM:  Wt Readings from Last 3 Encounters:  10/22/19 113 lb 12.1 oz (51.6 kg)  10/05/19 118 lb (53.5 kg)  09/29/19 118 lb (53.5 kg)   Temp Readings from Last 3 Encounters:  10/24/19 98.4 F (36.9 C) (Oral)  10/22/19 98.3 F (36.8 C) (Oral)  10/05/19 98.6 F (37 C) (Oral)   BP Readings from Last 3 Encounters:  10/24/19 119/63  10/22/19 (!) 85/62  10/05/19 (!) 125/95   Pulse Readings from Last 3 Encounters:  10/24/19 94  10/22/19 (!) 110  10/05/19 87   Pain Assessment Pain Score: 4 /10  In  general this is a chronically ill appearing African-American female in no acute distress.  She is alert and oriented and appropriate throughout the examination. HEENT reveals that the patient is normocephalic, atraumatic. EOMs are intact. PERRLA. Skin is intact without any evidence of gross lesions. Cardiovascular exam reveals a regular rate and rhythm, no clicks rubs or murmurs are auscultated. Chest is clear to auscultation bilaterally. Abdomen has active bowel sounds in all quadrants and is intact. The abdomen is soft, non tender, non distended. Lower extremities are negative for pretibial pitting edema, deep calf tenderness, cyanosis or clubbing.   KPS = 40  100 - Normal; no complaints; no evidence of disease. 90   - Able to carry on normal activity; minor signs or symptoms of disease. 80   - Normal activity with effort; some signs or symptoms of disease. 31   - Cares for self; unable to carry on normal activity or to do active work. 60   - Requires occasional assistance, but is able to care for most of his personal needs. 50   - Requires considerable assistance and frequent medical care. 15   - Disabled; requires  special care and assistance. 61   - Severely disabled; hospital admission is indicated although death not imminent. 2   - Very sick; hospital admission necessary; active supportive treatment necessary. 10   - Moribund; fatal processes progressing rapidly. 0     - Dead  Karnofsky DA, Abelmann [place], Craver LS and Burchenal [facility] [phone]) The use of the nitrogen mustards in the palliative treatment of carcinoma: with particular reference to bronchogenic carcinoma Cancer 1 634-56  LABORATORY DATA:  Lab Results  Component Value Date   WBC 11.6 (H) 10/24/2019   HGB 9.1 (L) 10/24/2019   HCT 29.6 (L) 10/24/2019   MCV 94.3 10/24/2019   PLT 122 (L) 10/24/2019   Lab Results  Component Value Date   NA 140 10/24/2019   K 3.6 10/24/2019   CL 109 10/24/2019   CO2 22 10/24/2019   Lab Results  Component Value Date   ALT 75 (H) 10/24/2019   AST 49 (H) 10/24/2019   ALKPHOS 173 (H) 10/24/2019   BILITOT 0.6 10/24/2019     RADIOGRAPHY: CT ABDOMEN PELVIS W CONTRAST  Result Date: 09/29/2019 CLINICAL DATA:  Hematuria.  History of renal cell carcinoma EXAM: CT ABDOMEN AND PELVIS WITH CONTRAST TECHNIQUE: Multidetector CT imaging of the abdomen and pelvis was performed using the standard protocol following bolus administration of intravenous contrast. CONTRAST:  68mL OMNIPAQUE IOHEXOL 300 MG/ML  SOLN COMPARISON:  03/26/2019 FINDINGS: Lower chest: Nodules in the left lower lobe have enlarged since prior study, measuring up to 2.1 cm on the 1st image. This measured approximately 1.4 cm previously. No effusions. Hepatobiliary: No focal hepatic abnormality. Small layering stones within the gallbladder. No biliary ductal dilatation. Pancreas: No focal abnormality or ductal dilatation. Spleen: No focal abnormality.  Normal size. Adrenals/Urinary Tract: Large mass involving the mid and lower pole of the left kidney measures 7.8 x 7.3 cm and extends over a craniocaudal length of approximately 10.3 cm. This is enlarged from  previous study when this measured 5.9 x 6.2 x 4.7 cm. Left renal vein invasion with tumor thrombus extending through the left renal vein and extending just into the IVC. This has progressed since prior study. Prior right nephrectomy. Adrenal glands and urinary bladder unremarkable. Stomach/Bowel: Stomach, large and small bowel grossly unremarkable. Vascular/Lymphatic: Aortic atherosclerosis. No enlarged abdominal or pelvic lymph nodes. Stable partially occlusive DVT  in the left common femoral vein. Reproductive: No mass. Other: No free fluid or free air. Musculoskeletal: No acute bony abnormality. IMPRESSION: Enlarging left renal mass compatible renal cell carcinoma. Enlarging renal vein tumor invasion/thrombus which now extends just into the IVC. Enlarging clustered peribronchovascular nodules in the left lower lobe. Aortic atherosclerosis. Stable partially occlusive left common femoral vein DVT. Layering gallstones within the gallbladder. Electronically Signed   By: Rolm Baptise M.D.   On: 09/29/2019 08:29      IMPRESSION/PLAN: 1. 74 y.o. gross hematuria secondary to enlarging left renal mass, suspected to be associated with her stage IV renal cell carcinoma.  The patient's case and imaging from 09/29/2019 have been reviewed with Dr. Lisbeth Renshaw who has participated in the formulation of this treatment plan. Today, I talked to the patient about the findings and workup thus far. We discussed the natural history of metastatic renal cell carcinoma and general treatment, highlighting the role of palliative radiotherapy in the management of persistent gross hematuria. We discussed the available radiation techniques, and focused on the details and logistics of delivery. The recommendation is to proceed with a single fraction of palliative radiotherapy to the left renal mass in hopes of improving the gross hematuria.  We did discuss that given her solitary kidney, she is at increased risk of renal failure, possibly  requiring dialysis, following radiation.  However, we will make every attempt to spare as much healthy renal tissue as possible to prevent this.  We reviewed the anticipated acute and late sequelae associated with radiation in this setting. The patient was encouraged to ask questions that were answered to her stated satisfaction.  At the conclusion of our conversation, the patient appears to have a good understanding of her disease and our treatment recommendations which are of palliative intent.  She elects to proceed with a single fraction of palliative radiotherapy to the left renal mass and has freely signed written consent to proceed. A copy of this document will be placed in her medical record.  We will plan to proceed with CT simulation/treatment planning later this afternoon around 3 PM in anticipation of delivering her treatment on Monday, 10/27/2019.  In a visit lasting 70 minutes, greater than 50% of that time was spent in floor time, discussing her case and coordinating her care.   Nicholos Johns, PA-C   Jodelle Gross, MD, PhD  [facility]  Radiation Oncology Direct Dial: [phone]  Fax: [phone] [email or web address]  Skype  [email or web address]

## 2019-10-24 NOTE — Plan of Care (Signed)
Patient appetite increasing, working towards maintaining adequate nutrition. Deanna Artis RN

## 2019-10-24 NOTE — Progress Notes (Signed)
PROGRESS NOTE    Melanie Best   HWE:[id number]  DOB: 15-Dec-1945  DOA: 10/22/2019 PCP: [name], MD   Brief Narrative:  PRISILA Best s a 74 y.o. 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   Prior CT in July>> Enlarging left renal mass compatible renal cell carcinoma. Enlarging renal vein tumor invasion/thrombus which now extends just into the IVC. Stable partially occlusive left common femoral vein DVT.   Subjective: Today having some suprapubic pain. Appetite is better. No other complaints.     Assessment & Plan:   Principal Problem:   Acute blood loss anemia due to gross hematuria - has been transfused 2 U PRBC which has brought her Hb from 6.3 to 9.3 - she has ongoing hematuria which is likely the cause of the anemia   - I would use blood and IV Iron as palliative treatment of her underlying issues until she tells [place] not to - anemia panel shows severely low Iron levels- Ferritin is 7-  Will transfuse Feraheme today- she is in agreement - has ongoing gross hematuria after holding Xarelto  Active Problems: Fever -called by RN- temp has gone up to 100- follow- likely due to cancer    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  - s/p Nivolumab which was stopped due to disease progression - started on Cabometyx- patient stopped this on her own- on 4/19, told [name] that she wanted to let cancer take it's course w/o treatment - interestingly she presented on 7/9 to [name] to discuss treatment - seen in ED on 7/12 and again on 7/18 for hematuria- CT showed renal cell cancer and partially occlusive DVT in left  femoral vein - not a candidate  for treatment per oncology note on 8/4- very weak, barely eating- [name] mentioned discussing end of life issues  - he has also recommended an IVC filter and possible radiation - appreciate palliative care consult - plan for home tomorrow with either palliative care or hospice-will order a hospital bed and [facility]- d/w patient  DVT - Found in 03/26/19 in left common and left deep femoral veins - d/c Xarelto in light of above- I have contacted vascular surgery for an opinion on an IVC filter- I do feel she is close to end of life and at this point would not benefit from any procedures      COPD, very severe  - on chronic O2- stable on chronic Prednisone 10 mg daily  Underweight Body mass index is 18.36 kg/m.  Appreciate nutrition consult  DM 2 - has resolved due to weight loss from cancer  Hyperlipidemia - d/c Mevacor  CKD (chronic kidney disease), stage IIIa - stable  HTN (hypertension)  -cont Cardizem for now   Time spent in minutes: 35 DVT prophylaxis: none Code Status: Full code Family Communication: none Disposition Plan:  Status is: Inpatient  Remains inpatient appropriate because:Inpatient level of care appropriate due to severity of illness   Dispo: The patient is from: Home              Anticipated d/c is to: TBD  Anticipated d/c date is: tomorrow              Patient currently is not medically stable to d/c.   Consultants:   Oncology  Palliative care  Vascular  Procedures:   none Antimicrobials:  Anti-infectives (From admission, onward)   None       Objective: Vitals:   10/23/19 0912 10/23/19 1352 10/23/19 2134 10/24/19 0538  BP: (!) 110/55 98/69 (!) 106/56 133/64  Pulse: 92 86 89 94  Resp: 18 18 20 16   Temp: 100 F (37.8 C) 98.6 F (37 C) 98.4 F (36.9 C) 98.4 F (36.9 C)  TempSrc:  Oral    SpO2: 99% 100% 100% 100%  Weight:      Height:        Intake/Output Summary (Last 24 hours) at 10/24/2019 1149 Last data filed at  10/24/2019 0900 Gross per 24 hour  Intake 120 ml  Output 1300 ml  Net -1180 ml   Filed Weights   10/22/19 2313  Weight: 51.6 kg    Examination: General exam: Appears comfortable  HEENT: PERRLA, oral mucosa moist, no sclera icterus or thrush Respiratory system: Clear to auscultation. Respiratory effort normal. Cardiovascular system: S1 & S2 heard,  No murmurs  Gastrointestinal system: Abdomen soft, tender in suprapubic area- nondistended. Normal bowel sounds   Central nervous system: Alert and oriented. No focal neurological deficits. Extremities: No cyanosis, clubbing or edema Skin: No rashes or ulcers Psychiatry:  Mood & affect appropriate.   Data Reviewed: I have personally reviewed following labs and imaging studies  CBC: Recent Labs  Lab 10/22/19 1412 10/22/19 1751 10/23/19 0543 10/24/19 0905  WBC 13.6*  --  11.7* 11.6*  NEUTROABS 12.0*  --   --  9.6*  HGB 6.3* 6.0* 9.3* 9.1*  HCT 22.2* 21.0* 29.7* 29.6*  MCV 102.8*  --  93.7 94.3  PLT 146*  --  108* 322*   Basic Metabolic Panel: Recent Labs  Lab 10/22/19 1412 10/23/19 0543 10/24/19 0905  NA 140 137 140  K 3.6 3.8 3.6  CL 107 106 109  CO2 22 20* 22  GLUCOSE 104* 103* 101*  BUN 33* 25* 26*  CREATININE 1.58* 1.30* 1.30*  CALCIUM 8.8* 7.9* 7.6*   GFR: Estimated Creatinine Clearance: 31.9 mL/min (A) (by C-G formula based on SCr of 1.3 mg/dL (H)). Liver Function Tests: Recent Labs  Lab 10/22/19 1412 10/23/19 0543 10/24/19 0905  AST 47* 101* 49*  ALT 61* 94* 75*  ALKPHOS 130* 221* 173*  BILITOT 0.5 0.9 0.6  PROT 6.1* 5.6* 5.5*  ALBUMIN 3.6 3.0* 2.6*   No results for input(s): LIPASE, AMYLASE in the last 168 hours. No results for input(s): AMMONIA in the last 168 hours. Coagulation Profile: Recent Labs  Lab 10/23/19 0543  INR 1.8*   Cardiac Enzymes: No results for input(s): CKTOTAL, CKMB, CKMBINDEX, TROPONINI in the last 168 hours. BNP (last 3 results) No results for input(s): PROBNP in the  last 8760 hours. HbA1C: Recent Labs    10/23/19 0543  HGBA1C 5.3   CBG: Recent Labs  Lab 10/23/19 1143 10/23/19 1631 10/23/19 2136 10/24/19 0801 10/24/19 1133  GLUCAP 98 171* 141* 112* 121*   Lipid Profile: No results for input(s): CHOL, HDL, LDLCALC, TRIG, CHOLHDL, LDLDIRECT in the last 72 hours. Thyroid Function Tests: No results for input(s): TSH, T4TOTAL, FREET4, T3FREE, THYROIDAB in the last 72 hours. Anemia Panel: Recent Labs    10/24/19 0905  FERRITIN 153  TIBC 238*  IRON 16*  Urine analysis:    Component Value Date/Time   COLORURINE RED (A) 10/22/2019 1852   APPEARANCEUR HAZY (A) 10/22/2019 1852   LABSPEC  10/22/2019 1852    TEST NOT REPORTED DUE TO COLOR INTERFERENCE OF URINE PIGMENT   PHURINE  10/22/2019 1852    TEST NOT REPORTED DUE TO COLOR INTERFERENCE OF URINE PIGMENT   GLUCOSEU (A) 10/22/2019 1852    TEST NOT REPORTED DUE TO COLOR INTERFERENCE OF URINE PIGMENT   HGBUR (A) 10/22/2019 1852    TEST NOT REPORTED DUE TO COLOR INTERFERENCE OF URINE PIGMENT   BILIRUBINUR (A) 10/22/2019 1852    TEST NOT REPORTED DUE TO COLOR INTERFERENCE OF URINE PIGMENT   KETONESUR (A) 10/22/2019 1852    TEST NOT REPORTED DUE TO COLOR INTERFERENCE OF URINE PIGMENT   PROTEINUR (A) 10/22/2019 1852    TEST NOT REPORTED DUE TO COLOR INTERFERENCE OF URINE PIGMENT   UROBILINOGEN 0.2 01/27/2012 1206   NITRITE (A) 10/22/2019 1852    TEST NOT REPORTED DUE TO COLOR INTERFERENCE OF URINE PIGMENT   LEUKOCYTESUR (A) 10/22/2019 1852    TEST NOT REPORTED DUE TO COLOR INTERFERENCE OF URINE PIGMENT   Sepsis Labs: @LABRCNTIP (procalcitonin:4,lacticidven:4) ) Recent Results (from the past 240 hour(s))  Urine culture     Status: Abnormal   Collection Time: 10/22/19  6:52 PM   Specimen: Urine, Random  Result Value Ref Range Status   Specimen Description   Final    URINE, RANDOM Performed at [facility], [place] [address] [place], [place] [address]    Special  Requests   Final    NONE Performed at [facility], [place] [address] [place], [place] [address]    Culture MULTIPLE SPECIES PRESENT, SUGGEST RECOLLECTION (A)  Final   Report Status 10/23/2019 FINAL  Final  SARS Coronavirus 2 by RT PCR (hospital order, performed in [facility] hospital lab) Nasopharyngeal Nasopharyngeal Swab     Status: None   Collection Time: 10/22/19  6:53 PM   Specimen: Nasopharyngeal Swab  Result Value Ref Range Status   SARS Coronavirus 2 NEGATIVE NEGATIVE Final    Comment: (NOTE) SARS-CoV-2 target nucleic acids are NOT DETECTED.  The SARS-CoV-2 RNA is generally detectable in upper and lower respiratory specimens during the acute phase of infection. The lowest concentration of SARS-CoV-2 viral copies this assay can detect is 250 copies / mL. A negative result does not preclude SARS-CoV-2 infection and should not be used as the sole basis for treatment or other patient management decisions.  A negative result may occur with improper specimen collection / handling, submission of specimen other than nasopharyngeal swab, presence of viral mutation(s) within the areas targeted by this assay, and inadequate number of viral copies (<250 copies / mL). A negative result must be combined with clinical observations, patient history, and epidemiological information.  Fact Sheet for Patients:   StrictlyIdeas.no  Fact Sheet for Healthcare Providers: BankingDealers.co.za  This test is not yet approved or  cleared by the [place] FDA and has been authorized for detection and/or diagnosis of SARS-CoV-2 by FDA under an Emergency Use Authorization (EUA).  This EUA will remain in effect (meaning this test can be used) for the duration of the COVID-19 declaration under Section 564(b)(1) of the Act, 21 U.S.C. section 360bbb-3(b)(1), unless the authorization is terminated or revoked sooner.  Performed at [facility], [place] [address], [place], [place] [address]      Radiology Studies: No results found.  Scheduled Meds:  (feeding supplement) PROSource Plus  30 mL Oral BID BM   brinzolamide  1 drop Both Eyes TID   Chlorhexidine Gluconate Cloth  6 each Topical Daily   diltiazem  180 mg Oral Daily   feeding supplement ([employer] STANDARD 1.4)  325 mL Oral BID BM   fentaNYL  1 patch Transdermal Q72H   insulin aspart  0-6 Units Subcutaneous TID WC   latanoprost  1 drop Both Eyes QHS   mouth rinse  15 mL Mouth Rinse BID   mirtazapine  7.5 mg Oral Daily   multivitamin with minerals  1 tablet Oral Daily   nystatin  5 mL Oral QID   pantoprazole  80 mg Oral Q1200   predniSONE  10 mg Oral Q breakfast   Continuous Infusions:   LOS: 2 days      Debbe Odea, MD [facility] Pager: [email or web address] 10/24/2019, 11:49 AM

## 2019-10-24 NOTE — TOC Transition Note (Signed)
Transition of Care [facility]) - CM/SW Discharge Note   Patient Details  Name: Melanie Best MRN: [id number] Date of Birth: [birth year]  [facility] CM/SW Contact:  Lennart Pall, LCSW Phone Number: 10/24/2019, 2:50 PM   Clinical Narrative:     Met with pt this morning who is hopeful to go home by tomorrow.  She is in agreement with MD recommendation for community palliative care referral - done (see below) and DME.  No further TOC needs at this time.  Final next level of care: Other (comment) (Home with Palliative Care) Barriers to Discharge: Continued Medical Work up   Patient Goals and CMS Choice Patient states their goals for this hospitalization and ongoing recovery are:: "To get back home" CMS Medicare.gov Compare Post Acute Care list provided to:: Patient Choice offered to / list presented to : Patient  Discharge Placement                       Discharge Plan and Services                DME Arranged: 3-N-1, Hospital bed DME Agency: [employer] Date DME Agency Contacted: 10/24/19 Time DME Agency Contacted: 1400 Representative spoke with at DME Agency: [place]: Hospice and [place] Date [place]: 10/24/19 Time [place]: 1400 Representative spoke with at [place]: [place] ([place]) Interventions     Readmission Risk Interventions No flowsheet data found.

## 2019-10-24 NOTE — Consult Note (Addendum)
Consultation Note Date: 10/24/2019   Patient Name: Melanie Best  DOB: 13-Aug-1945  MRN: [id number]  Age / Sex: 74 y.o., female  PCP: [name], MD Referring Physician: Debbe Odea, MD  Reason for Consultation: Establishing goals of care  HPI/Patient Profile: 74 y.o. female  with past medical history of diabetes, hypertension, COPD, and renal cell carcinoma admitted on 10/22/2019 with acute on chronic anemia and nausea. Pt had been seen in Dr. Loreli Slot office earlier that day, at which time lab work was obtained and showed a hgb of  6.3. He recommended that she go to the [facility] ED.  RCC originally diagnosed in 2010 at which time she underwent a right nephrectomy.  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 Received 3 cycles of nivolumab but had to be discontinued due to itching.  Feb 2019--trialed nivolumab again and received 2 cycles prior to being discontinued due to disease progression April 2019--started carbometyx--received 39moof treatment  April 2021--patient elected to discontinue therapy to "allow the cancer to take it's course" July 2021--large left renal mass invading the left renal vein   She has continued to struggle with pelvic pain and gross hematuria. This is complicated by the need for anticoagulation due to a DVT extending to the IVC. Dr. EMarin Olphas seen her this admission, and notes that there are no treatment options at this point, however is looking into palliative radiation. He was also considering IVC filter since anticoagulation had to be discontinued. Vascular was consulted however felt that the risk of  IVC filter placement would outweigh the benefit.    Clinical Assessment and Goals of Care:  I have reviewed medical records including EPIC notes, labs and imaging, examined the patient and met at bedside with the patient  to discuss diagnosis prognosis, GOC, EOL wishes, disposition and options.  I introduced Palliative Medicine as specialized medical care for people living with serious illness. It focuses on providing relief from the symptoms and stress of a serious illness.   Goals of care: Broad aims of medical therapy in relation to the patient's values and preferences. Our aim is to provide medical care aimed at enabling patients to achieve the goals that matter most to them, given the circumstances of their particular medical situation and their constraints.    As far as functional and nutritional status, patient has shown progressive decline over the past few months. She notes poor appetite.  We discussed the current plan to look into possible palliative radiation however she does recognize that this is not going to be a cure. She relays Dr. EAntonieta Pertconversation with her this morning regarding no further treatment. Pt notes that Dr. EMarin Olpspoke with her about hospice which she seems agreeable to.   Brief life review performed, patient lives at home with husband and son. She  has 4 kids. She has COPD and states that she has baseline dyspnea, she is on 24/7 2 L O2 [place] at home.   Primary Decision Maker PATIENT    SUMMARY OF RECOMMENDATIONS    Possible palliative radiation being discussed with oncology and radiation oncology.  Will need palliative vs hospice referral at discharge. Unclear if she would be able to receive palliative radiation under the hospice umbrella.   Will start morphine solution for pain control and shortness of breath  Continue fentanyl patch  Palliative care will continue to follow to assist with symptom management and outpatient arrangements   Code  Status/Advance Care Planning:  DNR  Palliative Prophylaxis:   Oral Care  Prognosis:    < 6 months  Discharge Planning: To Be Determined  Primary Diagnoses: Present on Admission: . Acute blood loss anemia . (Resolved) CKD (chronic kidney disease), stage II . HTN (hypertension) . Hyperlipidemia . COPD, very severe ([place]) . Renal cell carcinoma ([place])   I have reviewed the medical record, interviewed the patient and family, and examined the patient. The following aspects are pertinent.  Past Medical History:  Diagnosis Date  . Anemia   . Anxiety   . Arthritis    R leg  . Cancer ([place])    kidney  . Chronic fatigue 12/13/2016  . CKD (chronic kidney disease), stage II    removed R kidney- 2010- for Cancer, followed by Dr. Diona Fanti  . COPD (chronic obstructive pulmonary disease) (H[facility])    uses O2-2 liters , continuously , followed by Dr. Chase Caller  . Cough 08-02-2015  . Diabetes mellitus, type 2 ([place])   . DM (diabetes mellitus) ([place]) 01/27/2012  . DVT (deep venous thrombosis), left    2011- treated /w coumadin   . Dyslipidemia   . Dysrhythmia   . Esophageal reflux   . GERD (gastroesophageal reflux disease) 01/27/2012  . Glaucoma   . Goals of care, counseling/discussion 12/13/2016  . Hemoptysis 08-02-15  . History of radiation therapy 01/09/12,01/11/12,01/15/12,01/17/12,& 01/19/12   rul lung,50Gy/17f  . HTN (hypertension) 01/27/2012  . Hyperlipidemia 01/27/2012  . Hypertension    sees Dr. HDeforest Hoylesfor PCP, denies ever having stress or ECHO, ?when & where she had  last  EKG  . Leukocytosis   . Lung cancer ([place]    NSCLC  . Osteopenia   . Renal cell carcinoma of right kidney [place] 12/13/2016  . Shortness of breath   . Syncope   . Tremor   . Urinary incontinence   . Vitamin D deficiency    Social History   Socioeconomic History  . Marital status: Married    Spouse name: Not on file  . Number of children: Not on file  . Years of education: Not on file  . Highest  education level: Not on file  Occupational History  . Not on file  Tobacco Use  . Smoking status: Former Smoker    Packs/day: 1.00    Years: 35.00    Pack years: 35.00    Types: Cigarettes    Quit date: 03/20/2004    Years since quitting: 15.6  . Smokeless tobacco: Never Used  Vaping Use  . Vaping Use: Never used  Substance and Sexual Activity  . Alcohol use: No  . Drug use: No  . Sexual activity: Not on file  Other Topics Concern  . Not on file  Social History Narrative  . Not on file   Social Determinants of Health   Financial Resource Strain:   .  Difficulty of Paying Living Expenses:   Food Insecurity:   . Worried About Charity fundraiser in the Last Year:   . Arboriculturist in the Last Year:   Transportation Needs:   . Film/video editor (Medical):   Marland Kitchen Lack of Transportation (Non-Medical):   Physical Activity:   . Days of Exercise per Week:   . Minutes of Exercise per Session:   Stress:   . Feeling of Stress :   Social Connections:   . Frequency of Communication with Friends and Family:   . Frequency of Social Gatherings with Friends and Family:   . Attends Religious Services:   . Active Member of Clubs or Organizations:   . Attends Archivist Meetings:   Marland Kitchen Marital Status:    Family History  Problem Relation Age of Onset  . Emphysema Father   . Cancer Sister        behind eye  . Cancer Brother        lung  . Cancer Brother        lung  . Cancer Brother        throat  . Breast cancer Neg Hx    Scheduled Meds: . (feeding supplement) PROSource Plus  30 mL Oral BID BM  . brinzolamide  1 drop Both Eyes TID  . Chlorhexidine Gluconate Cloth  6 each Topical Daily  . diltiazem  180 mg Oral Daily  . feeding supplement (KATE FARMS STANDARD 1.4)  325 mL Oral BID BM  . fentaNYL  1 patch Transdermal Q72H  . insulin aspart  0-6 Units Subcutaneous TID WC  . latanoprost  1 drop Both Eyes QHS  . mouth rinse  15 mL Mouth Rinse BID  . mirtazapine   7.5 mg Oral Daily  . multivitamin with minerals  1 tablet Oral Daily  . nystatin  5 mL Oral QID  . pantoprazole  80 mg Oral Q1200  . predniSONE  10 mg Oral Q breakfast   Continuous Infusions: PRN Meds:.acetaminophen **OR** acetaminophen, ipratropium-albuterol, ondansetron **OR** ondansetron (ZOFRAN) IV, polyethylene glycol, sodium chloride flush, traZODone Medications Prior to Admission:  Prior to Admission medications   Medication Sig Start Date End Date Taking? Authorizing Provider  alendronate (FOSAMAX) 70 MG tablet Take 70 mg by mouth once a week. 03/08/19  Yes [provider]  brinzolamide (AZOPT) 1 % ophthalmic suspension Place 1 drop into both eyes 3 (three) times daily. Reported on 08/12/2015   Yes [provider]  diltiazem (CARDIZEM CD) 180 MG 24 hr capsule Take 180 mg by mouth daily. 04/14/14  Yes [provider]  ergocalciferol (VITAMIN D2) 50000 UNITS capsule Take 50,000 Units by mouth 2 (two) times a week. Take on Monday & thursday   Yes [provider]  esomeprazole (NEXIUM) 40 MG capsule Take 40 mg by mouth daily. 04/14/19  Yes [provider]  furosemide (LASIX) 20 MG tablet Take 20 mg by mouth daily as needed. For fluid retention   Yes [provider]  ipratropium-albuterol (DUONEB) 0.5-2.5 (3) MG/3ML SOLN Take 3 mLs by nebulization 4 (four) times daily as needed. Patient taking differently: Take 3 mLs by nebulization 4 (four) times daily as needed (SOB/Wheezing).  01/13/15  Yes Brand Males, MD  lidocaine-prilocaine (EMLA) cream Apply 1 application topically as needed (port access).    Yes [provider]  lovastatin (MEVACOR) 20 MG tablet Take 20 mg by mouth daily.  08/04/18  Yes [provider]  LUMIGAN 0.01 %  SOLN Place 1 drop into both eyes daily.  07/26/18  Yes [provider]  mirtazapine (REMERON) 7.5 MG tablet Take 1 tablet (7.5 mg total) by mouth daily. 06/12/19  Yes Heilingoetter,  Cassandra L, PA-C  Nebulizers (VIOS AEROSOL DELIVERY SYSTEM) MISC USE UTD BY MD WITH NEBULIZER SOLUTION 04/05/18  Yes [provider]  OXYGEN Inhale 2 L into the lungs continuous.   Yes [provider]  predniSONE (DELTASONE) 10 MG tablet Take 10 mg by mouth daily with breakfast.    Yes [provider]  XARELTO 20 MG TABS tablet TAKE 1 TABLET BY MOUTH  DAILY WITH SUPPER Patient taking differently: Take 20 mg by mouth daily with supper.  09/16/19  Yes Heilingoetter, Cassandra L, PA-C  hydrocortisone cream 1 % Apply 1 application topically 2 (two) times daily. Patient not taking: Reported on 09/29/2019 10/16/17   Curt Bears, MD  magic mouthwash w/lidocaine SOLN Take 5 mLs by mouth 4 (four) times daily as needed for mouth pain. Swish and spit before meals and at bedtime. Patient not taking: Reported on 05/13/2019 02/11/18   Maryanna Shape, NP  mupirocin ointment (BACTROBAN) 2 % Place 1 application into the nose 2 (two) times daily. Patient not taking: Reported on 09/29/2019 11/22/17   Sandi Mealy E., PA-C  CABOMETYX 40 MG tablet TAKE 1 TABLET (40MG) BY MOUTH ONCE DAILY. TAKE ON AN EMPTY STOMACH 1 HOUR BEFORE OR 2 HOURS AFTER MEALS 01/09/19   Heilingoetter, Cassandra L, PA-C   No Known Allergies Review of Systems  Constitutional: Positive for appetite change and fatigue.  Respiratory: Positive for shortness of breath.   Gastrointestinal: Positive for abdominal pain and nausea.  Genitourinary: Positive for hematuria.  Remainder of ROS negative unless stated above or in HPI  Vital Signs: BP 133/64 (BP Location: Left Arm)   Pulse 94   Temp 98.4 F (36.9 C)   Resp 16   Ht '5\' 6"'  (1.676 m)   Wt 51.6 kg   SpO2 100%   BMI 18.36 kg/m  Pain Scale: 0-10   Pain Score: 4    SpO2: SpO2: 100 % O2 Device:SpO2: 100 % O2 Flow Rate: .O2 Flow Rate (L/min): 2 L/min   General: chronically ill appearing female  HEENT: temporal wasting Regular work of breathing, has some  pursed lip breathing at times.  No edema Abdomen is not distended Awake alert Non focal.   IO: Intake/output summary:   Intake/Output Summary (Last 24 hours) at 10/24/2019 1033 Last data filed at 10/24/2019 0538 Gross per 24 hour  Intake --  Output 800 ml  Net -800 ml    LBM: Last BM Date: 10/22/19 Baseline Weight: Weight: 51.6 kg Most recent weight: Weight: 51.6 kg     Palliative Assessment/Data: 30%     Thank you for this consult. Palliative medicine will continue to follow and assist as needed. Loistine Chance MD [place] palliative Greater than 50% of this time was spent in counseling and coordinating care related to the above assessment and plan.   Time in 10 am Time out 11 am Total 60 min.   Mitzi Hansen, MD Internal Medicine Resident PGY-2 [facility] Pager: [phone] 10/24/2019 10:33 AM      Please contact Palliative Medicine Team phone at [phone] for questions and concerns.  For individual provider: See Shea Evans

## 2019-10-24 NOTE — Progress Notes (Signed)
Hydrologist [facility])  Hospital Liaison: RN note         Notified by [facility] manager of patient/family request for [facility] Palliative services at home after discharge.         Writer spoke with patient to confirm interest and explain services.               Please call with any hospice or palliative related questions.         Thank you for this referral.         Domenic Moras, BSN, RN [place] (listed on [place] under Hospice[facility])    [phone]

## 2019-10-25 LAB — CBC WITH DIFFERENTIAL/PLATELET
Abs Immature Granulocytes: 0.08 10*3/uL — ABNORMAL HIGH (ref 0.00–0.07)
Basophils Absolute: 0 10*3/uL (ref 0.0–0.1)
Basophils Relative: 0 %
Eosinophils Absolute: 0 10*3/uL (ref 0.0–0.5)
Eosinophils Relative: 0 %
HCT: 27.8 % — ABNORMAL LOW (ref 36.0–46.0)
Hemoglobin: 8.4 g/dL — ABNORMAL LOW (ref 12.0–15.0)
Immature Granulocytes: 1 %
Lymphocytes Relative: 13 %
Lymphs Abs: 1.4 10*3/uL (ref 0.7–4.0)
MCH: 29.4 pg (ref 26.0–34.0)
MCHC: 30.2 g/dL (ref 30.0–36.0)
MCV: 97.2 fL (ref 80.0–100.0)
Monocytes Absolute: 0.8 10*3/uL (ref 0.1–1.0)
Monocytes Relative: 8 %
Neutro Abs: 8 10*3/uL — ABNORMAL HIGH (ref 1.7–7.7)
Neutrophils Relative %: 78 %
Platelets: 108 10*3/uL — ABNORMAL LOW (ref 150–400)
RBC: 2.86 MIL/uL — ABNORMAL LOW (ref 3.87–5.11)
RDW: 17.5 % — ABNORMAL HIGH (ref 11.5–15.5)
WBC: 10.3 10*3/uL (ref 4.0–10.5)
nRBC: 0.2 % (ref 0.0–0.2)

## 2019-10-25 LAB — COMPREHENSIVE METABOLIC PANEL
ALT: 55 U/L — ABNORMAL HIGH (ref 0–44)
AST: 31 U/L (ref 15–41)
Albumin: 2.5 g/dL — ABNORMAL LOW (ref 3.5–5.0)
Alkaline Phosphatase: 143 U/L — ABNORMAL HIGH (ref 38–126)
Anion gap: 7 (ref 5–15)
BUN: 24 mg/dL — ABNORMAL HIGH (ref 8–23)
CO2: 24 mmol/L (ref 22–32)
Calcium: 7.8 mg/dL — ABNORMAL LOW (ref 8.9–10.3)
Chloride: 110 mmol/L (ref 98–111)
Creatinine, Ser: 1.41 mg/dL — ABNORMAL HIGH (ref 0.44–1.00)
GFR calc Af Amer: 43 mL/min — ABNORMAL LOW (ref >60)
GFR calc non Af Amer: 37 mL/min — ABNORMAL LOW (ref >60)
Glucose, Bld: 109 mg/dL — ABNORMAL HIGH (ref 70–99)
Potassium: 4.4 mmol/L (ref 3.5–5.1)
Sodium: 141 mmol/L (ref 135–145)
Total Bilirubin: 0.5 mg/dL (ref 0.3–1.2)
Total Protein: 5.4 g/dL — ABNORMAL LOW (ref 6.5–8.1)

## 2019-10-25 LAB — GLUCOSE, CAPILLARY
Glucose-Capillary: 94 mg/dL (ref 70–99)
Glucose-Capillary: 129 mg/dL — ABNORMAL HIGH (ref 70–99)

## 2019-10-25 MED ORDER — SENNA 8.6 MG PO TABS: 2.0000 | ORAL_TABLET | Freq: Every day | ORAL | 0 refills | Status: AC

## 2019-10-25 MED ORDER — BISACODYL 10 MG RE SUPP: 10.0000 mg | RECTAL | 0 refills | Status: AC | PRN

## 2019-10-25 MED ORDER — NYSTATIN 100000 UNIT/ML MT SUSP: 5.0000 mL | Freq: Four times a day (QID) | OROMUCOSAL | 0 refills | Status: AC

## 2019-10-25 MED ORDER — POLYETHYLENE GLYCOL 3350 17 G PO PACK: 17.0000 g | PACK | Freq: Every day | ORAL | 0 refills | Status: DC | PRN

## 2019-10-25 MED ORDER — IPRATROPIUM-ALBUTEROL 0.5-2.5 (3) MG/3ML IN SOLN
3.0000 mL | Freq: Three times a day (TID) | RESPIRATORY_TRACT | Status: DC
  Filled 2019-10-25: qty 3

## 2019-10-25 MED ORDER — ACETAMINOPHEN 325 MG PO TABS: 650.0000 mg | ORAL_TABLET | Freq: Four times a day (QID) | ORAL | Status: AC | PRN

## 2019-10-25 MED ORDER — MORPHINE SULFATE 10 MG/5ML PO SOLN: 5.0000 mg | ORAL | 0 refills | Status: DC | PRN

## 2019-10-25 MED ORDER — ADULT MULTIVITAMIN W/MINERALS CH: 1.0000 | ORAL_TABLET | Freq: Every day | ORAL | Status: AC

## 2019-10-25 MED ORDER — FENTANYL 12 MCG/HR TD PT72: 1.0000 | MEDICATED_PATCH | TRANSDERMAL | 0 refills | Status: DC

## 2019-10-25 MED ORDER — PROSOURCE PLUS PO LIQD: 30.0000 mL | Freq: Two times a day (BID) | ORAL | 0 refills | Status: AC

## 2019-10-25 MED ORDER — ONDANSETRON HCL 4 MG PO TABS: 4.0000 mg | ORAL_TABLET | Freq: Four times a day (QID) | ORAL | 0 refills | Status: AC | PRN

## 2019-10-25 MED ORDER — KATE FARMS STANDARD 1.4 PO LIQD: 325.0000 mL | Freq: Two times a day (BID) | ORAL | 30 refills | Status: AC

## 2019-10-25 MED ORDER — HEPARIN SOD (PORK) LOCK FLUSH 100 UNIT/ML IV SOLN
500.0000 [IU] | INTRAVENOUS | Status: DC | PRN
  Filled 2019-10-25: qty 5

## 2019-10-25 MED ORDER — FERROUS SULFATE 325 (65 FE) MG PO TBEC: 325.0000 mg | DELAYED_RELEASE_TABLET | Freq: Two times a day (BID) | ORAL | 3 refills | Status: AC

## 2019-10-25 NOTE — Discharge Summary (Signed)
Physician Discharge Summary  Melanie Best HQR:[id number] DOB: Sep 23, 1945 DOA: 72/06/2019  PCP: [name], MD  Admit date: 10/22/2019 Discharge date: 10/25/2019  Admitted From: home Disposition:  Home with palliative care   Recommendations for Outpatient Follow-up:  1. Check Hb 2 x week to detmine need for transfusions 2. Give another dose of Feraheme in 1 wk please   Discharge Condition:  stable   CODE STATUS:  DNR   Diet recommendation:  Regular diet Consultations:  Oncology  Palliative care  Vascular surgery  Rad Onc  Procedures/Studies: . CT simulation   Discharge Diagnoses:  Principal Problem:   Acute blood loss anemia - Gross Hematuria Active Problems:   Renal cell carcinoma (HCC)   COPD, very severe (HCC)   Hyperlipidemia   HTN (hypertension)   Port-A-Cath in place   Weight loss, abnormal   Palliative care by specialist   General weakness     Brief Summary: Melanie Best s a 74 y.o.femalewith severe COPD on home O2,leftrenal cell carcinomawith invasion into the left renal vein and IVC with associated thrombus on Xarelto, DM2, who presents from her oncologist office with acute anemia and a Hb of 6.3.  Prior CT in July>> Enlarging left renal mass compatible renal cell carcinoma. Enlarging renal vein tumor invasion/thrombus which now extends just into the IVC. Stable partially occlusive left common femoral vein DVT.   Hospital Course:  Principal Problem:   Acute blood loss anemia due to gross hematuria - has been transfused 2 U PRBC which has brought her Hb from 6.3 to 9.3 - she has ongoing hematuria which is likely the cause of the anemia   - I would use blood and IV Iron as palliative treatment of her underlying issues until she tells [place] not to - anemia panel shows severely low Iron levels- Ferritin is 7-  Will transfuse Feraheme today- she is in agreement - has ongoing gross hematuria after holding Xarelto- will need Hb check at least 2 x  week as outpatient and ongoing transfusions  Active Problems: Fever -called by RN- temp has gone up to 100- follow- likely due to cancer    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  - s/p Nivolumab which was stopped due to disease progression - started on Cabometyx- patient stopped this on her own- on 4/19, told [name] that she wanted to let cancer take it's course w/o treatment - interestingly she presented on 7/9 to [name] to discuss treatment - seen in ED on 7/12 and again on 7/18 for hematuria- CT showed renal cell cancer and partially occlusive DVT in left  femoral vein - not a candidate for treatment per oncology note on 8/4- very weak, barely eating- [name] mentioned discussing end of life issues - he has also recommended an IVC filter and possible radiation- she underwent CT simulation yesterday - appreciate palliative care consult- will go home with palliative care   DVT - Found in 03/26/19 in left common and left deep femoral veins - d/c Xarelto in light of above- I have contacted vascular surgery for an opinion on an IVC filter- I do feel she is close to end of life and at this point would not benefit from any procedures      COPD, very severe  - on chronic O2- stable on chronic Prednisone 10 mg daily  Underweight Body  mass index is 18.36 kg/m.  Appreciate nutrition consult  DM 2 - has resolved due to weight loss from cancer  Hyperlipidemia - d/c Mevacor  CKD (chronic kidney disease), stage IIIa - stable  HTN (hypertension)  -cont Cardizem for now     Discharge Exam: Vitals:   10/24/19 2016 10/25/19 0531  BP: (!) 104/57 109/61  Pulse: 94 88  Resp: 16 16  Temp: 98.3 F (36.8 C) 98.3 F (36.8 C)  SpO2: 100% 100%   Vitals:   10/24/19 0538 10/24/19 1322 10/24/19 2016 10/25/19 0531  BP: 133/64  119/63 (!) 104/57 109/61  Pulse: 94 94 94 88  Resp: 16 18 16 16   Temp: 98.4 F (36.9 C) 98.4 F (36.9 C) 98.3 F (36.8 C) 98.3 F (36.8 C)  TempSrc:  Oral Oral Oral  SpO2: 100% 100% 100% 100%  Weight:      Height:        General: Pt is alert, awake, not in acute distress Cardiovascular: RRR, S1/S2 +, no rubs, no gallops Respiratory: CTA bilaterally, no wheezing, no rhonchi Abdominal: Soft, NT, ND, bowel sounds + Extremities: no edema, no cyanosis   Discharge Instructions  Discharge Instructions    Diet general   Complete by: As directed    Increase activity slowly   Complete by: As directed      Allergies as of 10/25/2019   No Known Allergies     Medication List    STOP taking these medications   alendronate 70 MG tablet Commonly known as: FOSAMAX   furosemide 20 MG tablet Commonly known as: LASIX   hydrocortisone cream 1 %   lovastatin 20 MG tablet Commonly known as: MEVACOR   magic mouthwash w/lidocaine Soln   mupirocin ointment 2 % Commonly known as: Bactroban   Xarelto 20 MG Tabs tablet Generic drug: rivaroxaban     TAKE these medications   acetaminophen 325 MG tablet Commonly known as: TYLENOL Take 2 tablets (650 mg total) by mouth every 6 (six) hours as needed for mild pain (or Fever >/= 101).   Azopt 1 % ophthalmic suspension Generic drug: brinzolamide Place 1 drop into both eyes 3 (three) times daily. Reported on 08/12/2015   bisacodyl 10 MG suppository Commonly known as: DULCOLAX Place 1 suppository (10 mg total) rectally as needed for moderate constipation.   diltiazem 180 MG 24 hr capsule Commonly known as: CARDIZEM CD Take 180 mg by mouth daily.   ergocalciferol 1.25 MG (50000 UT) capsule Commonly known as: VITAMIN D2 Take 50,000 Units by mouth 2 (two) times a week. Take on Monday & thursday   esomeprazole 40 MG capsule Commonly known as: NEXIUM Take 40 mg by mouth daily.   feeding supplement (KATE FARMS STANDARD 1.4) Liqd  liquid Take 325 mLs by mouth 2 (two) times daily between meals.   (feeding supplement) PROSource Plus liquid Take 30 mLs by mouth 2 (two) times daily between meals.   fentaNYL 12 MCG/HR Commonly known as: [place] 1 patch onto the skin every 3 (three) days. Start taking on: October 27, 2019   ferrous sulfate 325 (65 FE) MG EC tablet Take 1 tablet (325 mg total) by mouth 2 (two) times daily.   ipratropium-albuterol 0.5-2.5 (3) MG/3ML Soln Commonly known as: DUONEB Take 3 mLs by nebulization 4 (four) times daily as needed. What changed: reasons to take this   lidocaine-prilocaine cream Commonly known as: EMLA Apply 1 application topically as needed (port access).   Lumigan 0.01 % Soln Generic  drug: bimatoprost Place 1 drop into both eyes daily.   mirtazapine 7.5 MG tablet Commonly known as: REMERON Take 1 tablet (7.5 mg total) by mouth daily.   morphine 10 MG/5ML solution Take 2.5 mLs (5 mg total) by mouth every 3 (three) hours as needed for severe pain (or shortness of breath.).   multivitamin with minerals Tabs tablet Take 1 tablet by mouth daily. Start taking on: October 26, 2019   nystatin 100000 UNIT/ML suspension Commonly known as: MYCOSTATIN Take 5 mLs (500,000 Units total) by mouth 4 (four) times daily.   ondansetron 4 MG tablet Commonly known as: ZOFRAN Take 1 tablet (4 mg total) by mouth every 6 (six) hours as needed for nausea.   OXYGEN Inhale 2 L into the lungs continuous.   polyethylene glycol 17 g packet Commonly known as: MIRALAX / GLYCOLAX Take 17 g by mouth daily as needed for mild constipation.   predniSONE 10 MG tablet Commonly known as: DELTASONE Take 10 mg by mouth daily with breakfast.   senna 8.6 MG Tabs tablet Commonly known as: SENOKOT Take 2 tablets (17.2 mg total) by mouth at bedtime.   Vios Aerosol Delivery System Misc USE UTD BY MD WITH NEBULIZER SOLUTION            Durable Medical Equipment  (From admission, onward)          Start     Ordered   10/24/19 0920  For home use only DME Hospital bed  Once       Question Answer Comment  Length of Need Lifetime   The above medical condition requires: Patient requires the ability to reposition frequently   Head must be elevated greater than: 30 degrees   Bed type Semi-electric   Support Surface: Gel Overlay      10/24/19 0919   10/24/19 0920  For home use only DME Bedside commode  Once       Question:  Patient needs a bedside commode to treat with the following condition  Answer:  Physical deconditioning   10/24/19 0919          No Known Allergies    CT ABDOMEN PELVIS W CONTRAST  Result Date: 09/29/2019 CLINICAL DATA:  Hematuria.  History of renal cell carcinoma EXAM: CT ABDOMEN AND PELVIS WITH CONTRAST TECHNIQUE: Multidetector CT imaging of the abdomen and pelvis was performed using the standard protocol following bolus administration of intravenous contrast. CONTRAST:  67mL OMNIPAQUE IOHEXOL 300 MG/ML  SOLN COMPARISON:  03/26/2019 FINDINGS: Lower chest: Nodules in the left lower lobe have enlarged since prior study, measuring up to 2.1 cm on the 1st image. This measured approximately 1.4 cm previously. No effusions. Hepatobiliary: No focal hepatic abnormality. Small layering stones within the gallbladder. No biliary ductal dilatation. Pancreas: No focal abnormality or ductal dilatation. Spleen: No focal abnormality.  Normal size. Adrenals/Urinary Tract: Large mass involving the mid and lower pole of the left kidney measures 7.8 x 7.3 cm and extends over a craniocaudal length of approximately 10.3 cm. This is enlarged from previous study when this measured 5.9 x 6.2 x 4.7 cm. Left renal vein invasion with tumor thrombus extending through the left renal vein and extending just into the IVC. This has progressed since prior study. Prior right nephrectomy. Adrenal glands and urinary bladder unremarkable. Stomach/Bowel: Stomach, large and small bowel grossly  unremarkable. Vascular/Lymphatic: Aortic atherosclerosis. No enlarged abdominal or pelvic lymph nodes. Stable partially occlusive DVT in the left common femoral vein. Reproductive: No mass. Other: No  free fluid or free air. Musculoskeletal: No acute bony abnormality. IMPRESSION: Enlarging left renal mass compatible renal cell carcinoma. Enlarging renal vein tumor invasion/thrombus which now extends just into the IVC. Enlarging clustered peribronchovascular nodules in the left lower lobe. Aortic atherosclerosis. Stable partially occlusive left common femoral vein DVT. Layering gallstones within the gallbladder. Electronically Signed   By: Rolm Baptise M.D.   On: 09/29/2019 08:29     The results of significant diagnostics from this hospitalization (including imaging, microbiology, ancillary and laboratory) are listed below for reference.     Microbiology: Recent Results (from the past 240 hour(s))  Urine culture     Status: Abnormal   Collection Time: 10/22/19  6:52 PM   Specimen: Urine, Random  Result Value Ref Range Status   Specimen Description   Final    URINE, RANDOM Performed at [facility] [address] [place], [place] [address]    Special Requests   Final    NONE Performed at [facility], [place] [address], [place], [place] [address]    Culture MULTIPLE SPECIES PRESENT, SUGGEST RECOLLECTION (A)  Final   Report Status 10/23/2019 FINAL  Final  SARS Coronavirus 2 by RT PCR (hospital order, performed in [facility] hospital lab) Nasopharyngeal Nasopharyngeal Swab     Status: None   Collection Time: 10/22/19  6:53 PM   Specimen: Nasopharyngeal Swab  Result Value Ref Range Status   SARS Coronavirus 2 NEGATIVE NEGATIVE Final    Comment: (NOTE) SARS-CoV-2 target nucleic acids are NOT DETECTED.  The SARS-CoV-2 RNA is generally detectable in upper and lower respiratory specimens during the acute phase of infection. The lowest concentration of  SARS-CoV-2 viral copies this assay can detect is 250 copies / mL. A negative result does not preclude SARS-CoV-2 infection and should not be used as the sole basis for treatment or other patient management decisions.  A negative result may occur with improper specimen collection / handling, submission of specimen other than nasopharyngeal swab, presence of viral mutation(s) within the areas targeted by this assay, and inadequate number of viral copies (<250 copies / mL). A negative result must be combined with clinical observations, patient history, and epidemiological information.  Fact Sheet for Patients:   StrictlyIdeas.no  Fact Sheet for Healthcare Providers: BankingDealers.co.za  This test is not yet approved or  cleared by the [place] FDA and has been authorized for detection and/or diagnosis of SARS-CoV-2 by FDA under an Emergency Use Authorization (EUA).  This EUA will remain in effect (meaning this test can be used) for the duration of the COVID-19 declaration under Section 564(b)(1) of the Act, 21 U.S.C. section 360bbb-3(b)(1), unless the authorization is terminated or revoked sooner.  Performed at [facility], [place] [address] [place], [place] [address]      Labs: BNP (last 3 results) No results for input(s): BNP in the last 8760 hours. Basic Metabolic Panel: Recent Labs  Lab 10/22/19 1412 10/23/19 0543 10/24/19 0905 10/25/19 0429  NA 140 137 140 141  K 3.6 3.8 3.6 4.4  CL 107 106 109 110  CO2 22 20* 22 24  GLUCOSE 104* 103* 101* 109*  BUN 33* 25* 26* 24*  CREATININE 1.58* 1.30* 1.30* 1.41*  CALCIUM 8.8* 7.9* 7.6* 7.8*   Liver Function Tests: Recent Labs  Lab 10/22/19 1412 10/23/19 0543 10/24/19 0905 10/25/19 0429  AST 47* 101* 49* 31  ALT 61* 94* 75* 55*  ALKPHOS 130* 221* 173* 143*  BILITOT 0.5  0.9 0.6 0.5  PROT 6.1* 5.6* 5.5* 5.4*  ALBUMIN 3.6 3.0* 2.6* 2.5*   No results  for input(s): LIPASE, AMYLASE in the last 168 hours. No results for input(s): AMMONIA in the last 168 hours. CBC: Recent Labs  Lab 10/22/19 1412 10/22/19 1751 10/23/19 0543 10/24/19 0905 10/25/19 0429  WBC 13.6*  --  11.7* 11.6* 10.3  NEUTROABS 12.0*  --   --  9.6* 8.0*  HGB 6.3* 6.0* 9.3* 9.1* 8.4*  HCT 22.2* 21.0* 29.7* 29.6* 27.8*  MCV 102.8*  --  93.7 94.3 97.2  PLT 146*  --  108* 122* 108*   Cardiac Enzymes: No results for input(s): CKTOTAL, CKMB, CKMBINDEX, TROPONINI in the last 168 hours. BNP: Invalid input(s): POCBNP CBG: Recent Labs  Lab 10/24/19 1133 10/24/19 1652 10/24/19 2244 10/25/19 0749 10/25/19 1201  GLUCAP 121* 219* 131* 94 129*   D-Dimer No results for input(s): DDIMER in the last 72 hours. Hgb A1c Recent Labs    10/23/19 0543  HGBA1C 5.3   Lipid Profile No results for input(s): CHOL, HDL, LDLCALC, TRIG, CHOLHDL, LDLDIRECT in the last 72 hours. Thyroid function studies No results for input(s): TSH, T4TOTAL, T3FREE, THYROIDAB in the last 72 hours.  Invalid input(s): FREET3 Anemia work up Recent Labs    10/24/19 0905  FERRITIN 153  TIBC 238*  IRON 16*   Urinalysis    Component Value Date/Time   COLORURINE RED (A) 10/22/2019 1852   APPEARANCEUR HAZY (A) 10/22/2019 1852   LABSPEC  10/22/2019 1852    TEST NOT REPORTED DUE TO COLOR INTERFERENCE OF URINE PIGMENT   PHURINE  10/22/2019 1852    TEST NOT REPORTED DUE TO COLOR INTERFERENCE OF URINE PIGMENT   GLUCOSEU (A) 10/22/2019 1852    TEST NOT REPORTED DUE TO COLOR INTERFERENCE OF URINE PIGMENT   HGBUR (A) 10/22/2019 1852    TEST NOT REPORTED DUE TO COLOR INTERFERENCE OF URINE PIGMENT   BILIRUBINUR (A) 10/22/2019 1852    TEST NOT REPORTED DUE TO COLOR INTERFERENCE OF URINE PIGMENT   KETONESUR (A) 10/22/2019 1852    TEST NOT REPORTED DUE TO COLOR INTERFERENCE OF URINE PIGMENT   PROTEINUR (A) 10/22/2019 1852    TEST NOT REPORTED DUE TO COLOR INTERFERENCE OF URINE PIGMENT   UROBILINOGEN  0.2 01/27/2012 1206   NITRITE (A) 10/22/2019 1852    TEST NOT REPORTED DUE TO COLOR INTERFERENCE OF URINE PIGMENT   LEUKOCYTESUR (A) 10/22/2019 1852    TEST NOT REPORTED DUE TO COLOR INTERFERENCE OF URINE PIGMENT   Sepsis Labs Invalid input(s): PROCALCITONIN,  WBC,  LACTICIDVEN Microbiology Recent Results (from the past 240 hour(s))  Urine culture     Status: Abnormal   Collection Time: 10/22/19  6:52 PM   Specimen: Urine, Random  Result Value Ref Range Status   Specimen Description   Final    URINE, RANDOM Performed at [facility], [place] [address] [place], [place] [address]    Special Requests   Final    NONE Performed at [facility], [place] [address], [place], [place] [address]    Culture MULTIPLE SPECIES PRESENT, SUGGEST RECOLLECTION (A)  Final   Report Status 10/23/2019 FINAL  Final  SARS Coronavirus 2 by RT PCR (hospital order, performed in [facility] hospital lab) Nasopharyngeal Nasopharyngeal Swab     Status: None   Collection Time: 10/22/19  6:53 PM   Specimen: Nasopharyngeal Swab  Result Value Ref Range Status   SARS Coronavirus 2 NEGATIVE NEGATIVE  Final    Comment: (NOTE) SARS-CoV-2 target nucleic acids are NOT DETECTED.  The SARS-CoV-2 RNA is generally detectable in upper and lower respiratory specimens during the acute phase of infection. The lowest concentration of SARS-CoV-2 viral copies this assay can detect is 250 copies / mL. A negative result does not preclude SARS-CoV-2 infection and should not be used as the sole basis for treatment or other patient management decisions.  A negative result may occur with improper specimen collection / handling, submission of specimen other than nasopharyngeal swab, presence of viral mutation(s) within the areas targeted by this assay, and inadequate number of viral copies (<250 copies / mL). A negative result must be combined with clinical observations, patient history, and  epidemiological information.  Fact Sheet for Patients:   StrictlyIdeas.no  Fact Sheet for Healthcare Providers: BankingDealers.co.za  This test is not yet approved or  cleared by the [place] FDA and has been authorized for detection and/or diagnosis of SARS-CoV-2 by FDA under an Emergency Use Authorization (EUA).  This EUA will remain in effect (meaning this test can be used) for the duration of the COVID-19 declaration under Section 564(b)(1) of the Act, 21 U.S.C. section 360bbb-3(b)(1), unless the authorization is terminated or revoked sooner.  Performed at [facility], [place] [address], [place], [place] [address]      Time coordinating discharge in minutes: 65  SIGNED:   Debbe Odea, MD  [facility] 10/25/2019, 12:25 PM

## 2019-10-25 NOTE — Progress Notes (Signed)
RT called for PRN duoneb for ins/exp wheezing. Pt has a home battery powered HHN that she had just used containing Atrovent and Albuterol. RT placed pt on a treatment schedule TID for duoneb to start at 1400 today and discussed that with pt and RN.

## 2019-10-25 NOTE — Plan of Care (Signed)
Patient discharged home in stable condition 

## 2019-10-25 NOTE — Progress Notes (Signed)
PMT progress note  Awake alert resting in bed, breathing has improved, anticipating discharge home today.   BP 109/61 (BP Location: Left Arm)   Pulse 88   Temp 98.3 F (36.8 C) (Oral)   Resp 16   Ht 5\' 6"  (1.676 m)   Wt 51.6 kg   SpO2 100%   BMI 18.36 kg/m  Labs and imaging reviewed Medication history noted, patient used 2 doses of PO morphine solution for shortness of breath thus far.  No distress Regular work of breathing S 1 S 2  Abdomen not distended Has some edema Non focal  PPS 40%  74 y.o. female  with past medical history of diabetes, hypertension, COPD, and renal cell carcinoma admitted on 10/22/2019 with acute on chronic anemia and nausea. Pt had been seen in Dr. Loreli Slot office earlier that day, at which time lab work was obtained and showed a hgb of  6.3. He recommended that she go to the [facility] ED.  RCC originally diagnosed in 2010 at which time she underwent a right nephrectomy.  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 Received 3 cycles of nivolumab but had to be discontinued due to itching.  Feb 2019--trialed nivolumab again and received 2 cycles prior to being discontinued due to disease progression April 2019--started carbometyx--received 72mo of treatment  April 2021--patient elected to discontinue therapy to "allow the cancer to take it's course" July 2021--large left renal mass invading the left renal vein   She has continued to struggle with pelvic pain and gross hematuria. This is complicated by the need for anticoagulation due to a DVT extending to the IVC. Dr. Marin Olp has seen her this admission, and notes that there are no treatment options at this point, however is looking  into palliative radiation. He was also considering IVC filter since anticoagulation had to be discontinued. Vascular was consulted however felt that the risk of IVC filter placement would outweigh the benefit.  PMT consulted for additional support, pain management and disposition options discussions.  PLAN: Continue PRN PO opioids Home with home based palliative care, to complete palliative radiation and then would recommend hospice support.  15 minutes spent [facility] palliative [phone].

## 2019-10-27 ENCOUNTER — Ambulatory Visit
Admission: RE | Admit: 2019-10-27 | Discharge: 2019-10-27 | Disposition: A | Discharge status: Home / Self Care | Payer: Medicare Other | Source: Ambulatory Visit | Attending: Radiation Oncology | Admitting: Radiation Oncology

## 2019-10-27 ENCOUNTER — Other Ambulatory Visit: Payer: Self-pay

## 2019-10-27 DIAGNOSIS — Z510 Encounter for antineoplastic radiation therapy: Secondary | ICD-10-CM | POA: Diagnosis not present

## 2019-10-27 DIAGNOSIS — C642 Malignant neoplasm of left kidney, except renal pelvis: Secondary | ICD-10-CM | POA: Diagnosis present

## 2019-10-28 ENCOUNTER — Telehealth: Payer: Self-pay | Admitting: *Deleted

## 2019-10-28 NOTE — Progress Notes (Signed)
Received patient in the clinic following 1 of 1 intended fractions to her left kidney. Patient without complaints. Explained the radiation she received today will continue to work in her system for a few weeks. Encouraged her to call with needs or concerns. Provided her with Blenda Nicely, RN's business card as a point of contact. Also, explained that Shona Simpson, PA-C will phone her in approximately one month to follow up. Patient verbalized understanding of all reviewed.

## 2019-10-28 NOTE — Telephone Encounter (Signed)
Message received from patient's daughter, Kerrin Champagne stating that hospice has not been out to see the patient yet since pt.'s discharge home from hospital.  Call placed back to [facility] to inform her that Dr. Antonieta Pert notes have been reviewed and that [facility] would be notified today regarding pt.'s need for admission to hospice. Kennyth Lose is appreciative of assistance and has no further questions at this time.

## 2019-11-03 ENCOUNTER — Inpatient Hospital Stay (HOSPITAL_COMMUNITY)
Admission: EM | Admit: 2019-11-03 | Discharge: 2019-11-11 | DRG: 177 | Disposition: A | Discharge status: Hospice | Attending: Internal Medicine | Admitting: Internal Medicine

## 2019-11-03 ENCOUNTER — Encounter (HOSPITAL_COMMUNITY): Payer: Self-pay

## 2019-11-03 ENCOUNTER — Other Ambulatory Visit: Payer: Self-pay

## 2019-11-03 ENCOUNTER — Emergency Department (HOSPITAL_COMMUNITY)

## 2019-11-03 DIAGNOSIS — I10 Essential (primary) hypertension: Secondary | ICD-10-CM

## 2019-11-03 DIAGNOSIS — T380X5A Adverse effect of glucocorticoids and synthetic analogues, initial encounter: Secondary | ICD-10-CM | POA: Diagnosis not present

## 2019-11-03 DIAGNOSIS — F329 Major depressive disorder, single episode, unspecified: Secondary | ICD-10-CM | POA: Diagnosis present

## 2019-11-03 DIAGNOSIS — Z9849 Cataract extraction status, unspecified eye: Secondary | ICD-10-CM

## 2019-11-03 DIAGNOSIS — C642 Malignant neoplasm of left kidney, except renal pelvis: Secondary | ICD-10-CM | POA: Diagnosis present

## 2019-11-03 DIAGNOSIS — I129 Hypertensive chronic kidney disease with stage 1 through stage 4 chronic kidney disease, or unspecified chronic kidney disease: Secondary | ICD-10-CM | POA: Diagnosis present

## 2019-11-03 DIAGNOSIS — D539 Nutritional anemia, unspecified: Secondary | ICD-10-CM | POA: Diagnosis present

## 2019-11-03 DIAGNOSIS — K219 Gastro-esophageal reflux disease without esophagitis: Secondary | ICD-10-CM | POA: Diagnosis present

## 2019-11-03 DIAGNOSIS — Z87891 Personal history of nicotine dependence: Secondary | ICD-10-CM

## 2019-11-03 DIAGNOSIS — Z9221 Personal history of antineoplastic chemotherapy: Secondary | ICD-10-CM

## 2019-11-03 DIAGNOSIS — Y92239 Unspecified place in hospital as the place of occurrence of the external cause: Secondary | ICD-10-CM | POA: Diagnosis not present

## 2019-11-03 DIAGNOSIS — R109 Unspecified abdominal pain: Secondary | ICD-10-CM

## 2019-11-03 DIAGNOSIS — Z923 Personal history of irradiation: Secondary | ICD-10-CM

## 2019-11-03 DIAGNOSIS — Z681 Body mass index (BMI) 19 or less, adult: Secondary | ICD-10-CM

## 2019-11-03 DIAGNOSIS — Z23 Encounter for immunization: Secondary | ICD-10-CM

## 2019-11-03 DIAGNOSIS — J449 Chronic obstructive pulmonary disease, unspecified: Secondary | ICD-10-CM

## 2019-11-03 DIAGNOSIS — N1832 Chronic kidney disease, stage 3b: Secondary | ICD-10-CM | POA: Diagnosis present

## 2019-11-03 DIAGNOSIS — Z7951 Long term (current) use of inhaled steroids: Secondary | ICD-10-CM

## 2019-11-03 DIAGNOSIS — J439 Emphysema, unspecified: Secondary | ICD-10-CM | POA: Diagnosis present

## 2019-11-03 DIAGNOSIS — J9621 Acute and chronic respiratory failure with hypoxia: Secondary | ICD-10-CM | POA: Diagnosis present

## 2019-11-03 DIAGNOSIS — Z85118 Personal history of other malignant neoplasm of bronchus and lung: Secondary | ICD-10-CM

## 2019-11-03 DIAGNOSIS — J9611 Chronic respiratory failure with hypoxia: Secondary | ICD-10-CM | POA: Diagnosis not present

## 2019-11-03 DIAGNOSIS — Y95 Nosocomial condition: Secondary | ICD-10-CM | POA: Diagnosis present

## 2019-11-03 DIAGNOSIS — E860 Dehydration: Secondary | ICD-10-CM | POA: Diagnosis present

## 2019-11-03 DIAGNOSIS — Z825 Family history of asthma and other chronic lower respiratory diseases: Secondary | ICD-10-CM

## 2019-11-03 DIAGNOSIS — Z801 Family history of malignant neoplasm of trachea, bronchus and lung: Secondary | ICD-10-CM

## 2019-11-03 DIAGNOSIS — J1282 Pneumonia due to coronavirus disease 2019: Secondary | ICD-10-CM | POA: Diagnosis present

## 2019-11-03 DIAGNOSIS — G4700 Insomnia, unspecified: Secondary | ICD-10-CM | POA: Diagnosis present

## 2019-11-03 DIAGNOSIS — E876 Hypokalemia: Secondary | ICD-10-CM | POA: Diagnosis present

## 2019-11-03 DIAGNOSIS — K5900 Constipation, unspecified: Secondary | ICD-10-CM | POA: Diagnosis present

## 2019-11-03 DIAGNOSIS — D649 Anemia, unspecified: Secondary | ICD-10-CM

## 2019-11-03 DIAGNOSIS — R630 Anorexia: Secondary | ICD-10-CM | POA: Diagnosis present

## 2019-11-03 DIAGNOSIS — Z79899 Other long term (current) drug therapy: Secondary | ICD-10-CM

## 2019-11-03 DIAGNOSIS — E785 Hyperlipidemia, unspecified: Secondary | ICD-10-CM | POA: Diagnosis present

## 2019-11-03 DIAGNOSIS — Z9981 Dependence on supplemental oxygen: Secondary | ICD-10-CM

## 2019-11-03 DIAGNOSIS — I82412 Acute embolism and thrombosis of left femoral vein: Secondary | ICD-10-CM | POA: Diagnosis present

## 2019-11-03 DIAGNOSIS — Z66 Do not resuscitate: Secondary | ICD-10-CM | POA: Diagnosis not present

## 2019-11-03 DIAGNOSIS — N179 Acute kidney failure, unspecified: Secondary | ICD-10-CM | POA: Diagnosis present

## 2019-11-03 DIAGNOSIS — U071 COVID-19: Principal | ICD-10-CM | POA: Diagnosis present

## 2019-11-03 DIAGNOSIS — F419 Anxiety disorder, unspecified: Secondary | ICD-10-CM | POA: Diagnosis present

## 2019-11-03 DIAGNOSIS — Z515 Encounter for palliative care: Secondary | ICD-10-CM | POA: Diagnosis present

## 2019-11-03 DIAGNOSIS — Z905 Acquired absence of kidney: Secondary | ICD-10-CM

## 2019-11-03 DIAGNOSIS — C649 Malignant neoplasm of unspecified kidney, except renal pelvis: Secondary | ICD-10-CM

## 2019-11-03 DIAGNOSIS — E1122 Type 2 diabetes mellitus with diabetic chronic kidney disease: Secondary | ICD-10-CM | POA: Diagnosis present

## 2019-11-03 DIAGNOSIS — E559 Vitamin D deficiency, unspecified: Secondary | ICD-10-CM | POA: Diagnosis present

## 2019-11-03 LAB — CBC WITH DIFFERENTIAL/PLATELET
Abs Immature Granulocytes: 0.16 10*3/uL — ABNORMAL HIGH (ref 0.00–0.07)
Basophils Absolute: 0 10*3/uL (ref 0.0–0.1)
Basophils Relative: 0 %
Eosinophils Absolute: 0 10*3/uL (ref 0.0–0.5)
Eosinophils Relative: 0 %
HCT: 28.7 % — ABNORMAL LOW (ref 36.0–46.0)
Hemoglobin: 8.4 g/dL — ABNORMAL LOW (ref 12.0–15.0)
Immature Granulocytes: 1 %
Lymphocytes Relative: 9 %
Lymphs Abs: 1.1 10*3/uL (ref 0.7–4.0)
MCH: 29.8 pg (ref 26.0–34.0)
MCHC: 29.3 g/dL — ABNORMAL LOW (ref 30.0–36.0)
MCV: 101.8 fL — ABNORMAL HIGH (ref 80.0–100.0)
Monocytes Absolute: 0.3 10*3/uL (ref 0.1–1.0)
Monocytes Relative: 3 %
Neutro Abs: 10.4 10*3/uL — ABNORMAL HIGH (ref 1.7–7.7)
Neutrophils Relative %: 87 %
Platelets: 128 10*3/uL — ABNORMAL LOW (ref 150–400)
RBC: 2.82 MIL/uL — ABNORMAL LOW (ref 3.87–5.11)
RDW: 18.2 % — ABNORMAL HIGH (ref 11.5–15.5)
WBC: 12 10*3/uL — ABNORMAL HIGH (ref 4.0–10.5)
nRBC: 0 % (ref 0.0–0.2)

## 2019-11-03 LAB — COMPREHENSIVE METABOLIC PANEL
ALT: 45 U/L — ABNORMAL HIGH (ref 0–44)
AST: 75 U/L — ABNORMAL HIGH (ref 15–41)
Albumin: 2.6 g/dL — ABNORMAL LOW (ref 3.5–5.0)
Alkaline Phosphatase: 134 U/L — ABNORMAL HIGH (ref 38–126)
Anion gap: 13 (ref 5–15)
BUN: 39 mg/dL — ABNORMAL HIGH (ref 8–23)
CO2: 21 mmol/L — ABNORMAL LOW (ref 22–32)
Calcium: 7.7 mg/dL — ABNORMAL LOW (ref 8.9–10.3)
Chloride: 102 mmol/L (ref 98–111)
Creatinine, Ser: 2.14 mg/dL — ABNORMAL HIGH (ref 0.44–1.00)
GFR calc Af Amer: 26 mL/min — ABNORMAL LOW (ref >60)
GFR calc non Af Amer: 22 mL/min — ABNORMAL LOW (ref >60)
Glucose, Bld: 82 mg/dL (ref 70–99)
Potassium: 4.9 mmol/L (ref 3.5–5.1)
Sodium: 136 mmol/L (ref 135–145)
Total Bilirubin: 1.2 mg/dL (ref 0.3–1.2)
Total Protein: 5.6 g/dL — ABNORMAL LOW (ref 6.5–8.1)

## 2019-11-03 LAB — TYPE AND SCREEN
ABO/RH(D): O POS
Antibody Screen: NEGATIVE

## 2019-11-03 LAB — SARS CORONAVIRUS 2 BY RT PCR (HOSPITAL ORDER, PERFORMED IN ~~LOC~~ HOSPITAL LAB): SARS Coronavirus 2: POSITIVE — AB

## 2019-11-03 LAB — LACTIC ACID, PLASMA: Lactic Acid, Venous: 1.3 mmol/L (ref 0.5–1.9)

## 2019-11-03 LAB — PROTIME-INR
INR: 1.1 (ref 0.8–1.2)
Prothrombin Time: 14.2 seconds (ref 11.4–15.2)

## 2019-11-03 MED ORDER — ACETAMINOPHEN 650 MG RE SUPP: 650.0000 mg | Freq: Four times a day (QID) | RECTAL | Status: DC | PRN

## 2019-11-03 MED ORDER — PREDNISONE 20 MG PO TABS
10.0000 mg | ORAL_TABLET | Freq: Every day | ORAL | Status: DC
  Administered 2019-11-04: 10 mg via ORAL
  Filled 2019-11-03: qty 1

## 2019-11-03 MED ORDER — PANTOPRAZOLE SODIUM 40 MG PO TBEC
80.0000 mg | DELAYED_RELEASE_TABLET | Freq: Every day | ORAL | Status: DC
  Administered 2019-11-04 – 2019-11-11 (×8): 80 mg via ORAL
  Filled 2019-11-03 (×10): qty 2

## 2019-11-03 MED ORDER — OXYCODONE HCL 5 MG PO TABS
5.0000 mg | ORAL_TABLET | ORAL | Status: DC | PRN
  Administered 2019-11-05 – 2019-11-09 (×13): 5 mg via ORAL
  Filled 2019-11-03 (×13): qty 1

## 2019-11-03 MED ORDER — METHYLPREDNISOLONE SODIUM SUCC 125 MG IJ SOLR: 125.0000 mg | Freq: Once | INTRAMUSCULAR | Status: DC | PRN

## 2019-11-03 MED ORDER — LATANOPROST 0.005 % OP SOLN
1.0000 [drp] | Freq: Every day | OPHTHALMIC | Status: DC
  Administered 2019-11-04 – 2019-11-10 (×8): 1 [drp] via OPHTHALMIC
  Filled 2019-11-03: qty 2.5

## 2019-11-03 MED ORDER — ONDANSETRON HCL 4 MG PO TABS: 4.0000 mg | ORAL_TABLET | Freq: Four times a day (QID) | ORAL | Status: DC | PRN

## 2019-11-03 MED ORDER — ALBUTEROL SULFATE HFA 108 (90 BASE) MCG/ACT IN AERS: 2.0000 | INHALATION_SPRAY | Freq: Once | RESPIRATORY_TRACT | Status: DC | PRN

## 2019-11-03 MED ORDER — ALBUTEROL SULFATE (2.5 MG/3ML) 0.083% IN NEBU: 2.5000 mg | INHALATION_SOLUTION | Freq: Four times a day (QID) | RESPIRATORY_TRACT | Status: DC

## 2019-11-03 MED ORDER — HYDROMORPHONE HCL 1 MG/ML IJ SOLN
0.5000 mg | INTRAMUSCULAR | Status: DC | PRN
  Administered 2019-11-04 (×3): 1 mg via INTRAVENOUS
  Filled 2019-11-03 (×3): qty 1

## 2019-11-03 MED ORDER — IPRATROPIUM BROMIDE 0.02 % IN SOLN: 0.5000 mg | Freq: Four times a day (QID) | RESPIRATORY_TRACT | Status: DC

## 2019-11-03 MED ORDER — SODIUM CHLORIDE 0.9 % IV SOLN
INTRAVENOUS | Status: DC | PRN
  Administered 2019-11-05: 500 mL via INTRAVENOUS

## 2019-11-03 MED ORDER — IPRATROPIUM BROMIDE HFA 17 MCG/ACT IN AERS
2.0000 | INHALATION_SPRAY | RESPIRATORY_TRACT | Status: DC
  Administered 2019-11-04 – 2019-11-06 (×15): 2 via RESPIRATORY_TRACT
  Filled 2019-11-03 (×2): qty 12.9

## 2019-11-03 MED ORDER — SODIUM CHLORIDE 0.9 % IV SOLN
1200.0000 mg | Freq: Once | INTRAVENOUS | Status: AC
  Administered 2019-11-04: 1200 mg via INTRAVENOUS
  Filled 2019-11-03: qty 540

## 2019-11-03 MED ORDER — TRAZODONE HCL 50 MG PO TABS
50.0000 mg | ORAL_TABLET | Freq: Every evening | ORAL | Status: DC | PRN
  Filled 2019-11-03: qty 1

## 2019-11-03 MED ORDER — DIPHENHYDRAMINE HCL 50 MG/ML IJ SOLN: 50.0000 mg | Freq: Once | INTRAMUSCULAR | Status: DC | PRN

## 2019-11-03 MED ORDER — ALBUTEROL SULFATE HFA 108 (90 BASE) MCG/ACT IN AERS
2.0000 | INHALATION_SPRAY | RESPIRATORY_TRACT | Status: DC | PRN
  Administered 2019-11-04 (×3): 2 via RESPIRATORY_TRACT
  Filled 2019-11-03: qty 6.7

## 2019-11-03 MED ORDER — SODIUM CHLORIDE 0.9 % IV SOLN: Freq: Once | INTRAVENOUS | Status: AC

## 2019-11-03 MED ORDER — EPINEPHRINE 0.3 MG/0.3ML IJ SOAJ
0.3000 mg | Freq: Once | INTRAMUSCULAR | Status: DC | PRN
  Filled 2019-11-03: qty 0.6

## 2019-11-03 MED ORDER — IPRATROPIUM-ALBUTEROL 0.5-2.5 (3) MG/3ML IN SOLN: 3.0000 mL | Freq: Four times a day (QID) | RESPIRATORY_TRACT | Status: DC | PRN

## 2019-11-03 MED ORDER — FAMOTIDINE IN NACL 20-0.9 MG/50ML-% IV SOLN: 20.0000 mg | Freq: Once | INTRAVENOUS | Status: DC | PRN

## 2019-11-03 MED ORDER — POLYETHYLENE GLYCOL 3350 17 G PO PACK: 17.0000 g | PACK | Freq: Every day | ORAL | Status: DC | PRN

## 2019-11-03 MED ORDER — MIRTAZAPINE 15 MG PO TABS
7.5000 mg | ORAL_TABLET | Freq: Every day | ORAL | Status: DC
  Administered 2019-11-04 – 2019-11-11 (×8): 7.5 mg via ORAL
  Filled 2019-11-03 (×8): qty 1

## 2019-11-03 MED ORDER — BRINZOLAMIDE 1 % OP SUSP
1.0000 [drp] | Freq: Three times a day (TID) | OPHTHALMIC | Status: DC
  Administered 2019-11-04 – 2019-11-11 (×20): 1 [drp] via OPHTHALMIC
  Filled 2019-11-03: qty 10

## 2019-11-03 MED ORDER — SODIUM CHLORIDE 0.9 % IV BOLUS
1000.0000 mL | Freq: Once | INTRAVENOUS | Status: AC
  Administered 2019-11-03: 1000 mL via INTRAVENOUS

## 2019-11-03 MED ORDER — ONDANSETRON HCL 4 MG/2ML IJ SOLN
4.0000 mg | Freq: Four times a day (QID) | INTRAMUSCULAR | Status: DC | PRN
  Administered 2019-11-04: 4 mg via INTRAVENOUS
  Filled 2019-11-03 (×2): qty 2

## 2019-11-03 MED ORDER — ACETAMINOPHEN 325 MG PO TABS
650.0000 mg | ORAL_TABLET | Freq: Four times a day (QID) | ORAL | Status: DC | PRN
  Administered 2019-11-04 – 2019-11-06 (×2): 650 mg via ORAL
  Filled 2019-11-03 (×2): qty 2

## 2019-11-03 NOTE — ED Triage Notes (Signed)
Pt BIB EMS from home. Pt reports feeling fatigued, SHOB, and chills since leaving the hospital a few weeks ago. Family reports she has been eating less. Pt reports using albuterol neb before EMS arrived. Hx of COPD and stage 4 renal cancer.  HR 90 RR 18 92% RA 100% 2L 90/48 57mL NS LAC 20G

## 2019-11-03 NOTE — H&P (Signed)
[facility] History and Physical  Anadelia Kintz MWU:[id number] DOB: 1945/11/27 DOA: 11/03/2019  Referring physician: Dr. Francia Greaves PCP: [name], MD   Chief Complaint: fatigue  HPI: Melanie Best is a 74 y.o. 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  Patient familiar to me from last admission.  She was admitted from August 4 to August 7 after being sent from her hematologist office with finding of new acute blood loss anemia secondary to gross hematuria from her known renal cell carcinoma and anticoagulation with Xarelto.  During admission she was transfused 2 units PRBC with her hemoglobin rising to 9.3 at discharge.  Her Xarelto was held at discharge given her poor prognosis there is no further plan for anticoagulation.  She was also seen by palliative care with plan to go home on palliative versus hospice, and was made DNR at that time.  Today she presents the ED for sending fatigue and lack of appetite.  He states the symptoms are gotten worse since she was discharged from the hospital.  She lives with her husband and her son, her son has been admitted to the hospital due to Covid in the past week.  She reports that she has received her Covid vaccine series back in March, she endorses a chronic shortness of breath but states that it is not any different than how she usually feels.  She has been on her usual 2 L of oxygen without needing additional oxygen.  With regards to hospice she is unsure of details, but states that a hospital bed was brought out to her home.  Review of chart also shows that she received palliative radiation on August 9.  At discharge she was prescribed morphine and fentanyl patches for air hunger but states she does not take this because she does not like the way it makes her  feel.  Work-up in the ED showed vital signs with slightly soft blood pressures in 90s over 40s that are lower compared to her baseline, stable hemoglobin since discharge at 8.4, and CMP showing increasing creatinine from 1.4 to 2.14 since discharge.  She was also found to be incidentally Covid positive.  She was admitted for management of AKI.  Review of Systems:  Pertinent positives and negative per HPI, all others reviewed and negative   Past Medical History:  Diagnosis Date  . Anemia   . Anxiety   . Arthritis    R leg  . Cancer ([place])    kidney  . Chronic fatigue 12/13/2016  . CKD (chronic kidney disease), stage II    removed R kidney- 2010- for Cancer, followed by Dr. Diona Fanti  . COPD (chronic obstructive pulmonary disease) (HCC)    uses O2-2 liters , continuously , followed by Dr. Chase Caller  . Cough 08-02-2015  . Diabetes mellitus, type 2 ([place])   . DM (diabetes mellitus) ([place]) 01/27/2012  . DVT (deep venous thrombosis), left    2011- treated /w coumadin   . Dyslipidemia   . Dysrhythmia   . Esophageal reflux   . GERD (gastroesophageal reflux disease) 01/27/2012  . Glaucoma   . Goals of care, counseling/discussion 12/13/2016  . Hemoptysis 08-02-15  . History of radiation therapy 01/09/12,01/11/12,01/15/12,01/17/12,& 01/19/12   rul lung,50Gy/59fx  . HTN (hypertension) 01/27/2012  . Hyperlipidemia 01/27/2012  . Hypertension    sees Dr. Deforest Hoyles for PCP, denies ever having  stress or ECHO, ?when & where she had  last  EKG  . Leukocytosis   . Lung cancer ([place])    NSCLC  . Osteopenia   . Renal cell carcinoma of right kidney ([place]) 12/13/2016  . Shortness of breath   . Syncope   . Tremor   . Urinary incontinence   . Vitamin D deficiency    Past Surgical History:  Procedure Laterality Date  . BACK SURGERY     for pinched nerve, 2011, here at [facility]  . EYE SURGERY     cataracts removed, ?IOL  . FLEXIBLE BRONCHOSCOPY  11/29/2011  . IR FLUORO GUIDE PORT INSERTION RIGHT  01/30/2017   . IR US GUIDE VASC ACCESS RIGHT  01/30/2017  . removed blood clot     left upper abd quad   . right kidney removed    . right nephrectomy     Social History:  reports that she quit smoking about 15 years ago. Her smoking use included cigarettes. She has a 35.00 pack-year smoking history. She has never used smokeless tobacco. She reports that she does not drink alcohol and does not use drugs.  No Known Allergies  Family History  Problem Relation Age of Onset  . Emphysema Father   . Cancer Sister        behind eye  . Cancer Brother        lung  . Cancer Brother        lung  . Cancer Brother        throat  . Breast cancer Neg Hx      Prior to Admission medications   Medication Sig Start Date End Date Taking? Authorizing Provider  acetaminophen (TYLENOL) 325 MG tablet Take 2 tablets (650 mg total) by mouth every 6 (six) hours as needed for mild pain (or Fever >/= 101). 10/25/19   Debbe Odea, MD  bisacodyl (DULCOLAX) 10 MG suppository Place 1 suppository (10 mg total) rectally as needed for moderate constipation. 10/25/19   Debbe Odea, MD  brinzolamide (AZOPT) 1 % ophthalmic suspension Place 1 drop into both eyes 3 (three) times daily. Reported on 08/12/2015    [provider]  diltiazem (CARDIZEM CD) 180 MG 24 hr capsule Take 180 mg by mouth daily. 04/14/14   [provider]  ergocalciferol (VITAMIN D2) 50000 UNITS capsule Take 50,000 Units by mouth 2 (two) times a week. Take on Monday & thursday    [provider]  esomeprazole (NEXIUM) 40 MG capsule Take 40 mg by mouth daily. 04/14/19   [provider]  fentaNYL (DURAGESIC) 12 MCG/HR Place 1 patch onto the skin every 3 (three) days. 10/27/19   Debbe Odea, MD  ferrous sulfate 325 (65 FE) MG EC tablet Take 1 tablet (325 mg total) by mouth 2 (two) times daily. 10/25/19 10/24/20  Debbe Odea, MD  ipratropium-albuterol (DUONEB) 0.5-2.5 (3) MG/3ML SOLN Take 3 mLs by nebulization 4 (four) times daily as  needed. Patient taking differently: Take 3 mLs by nebulization 4 (four) times daily as needed (SOB/Wheezing).  01/13/15   Brand Males, MD  lidocaine-prilocaine (EMLA) cream Apply 1 application topically as needed (port access).     [provider]  LUMIGAN 0.01 % SOLN Place 1 drop into both eyes daily.  07/26/18   [provider]  mirtazapine (REMERON) 7.5 MG tablet Take 1 tablet (7.5 mg total) by mouth daily. 06/12/19   Heilingoetter, Cassandra L, PA-C  morphine 10 MG/5ML solution Take 2.5 mLs (5 mg  total) by mouth every 3 (three) hours as needed for severe pain (or shortness of breath.). 10/25/19   Debbe Odea, MD  Multiple Vitamin (MULTIVITAMIN WITH MINERALS) TABS tablet Take 1 tablet by mouth daily. 10/26/19   Debbe Odea, MD  Nebulizers (VIOS AEROSOL DELIVERY SYSTEM) MISC USE UTD BY MD WITH NEBULIZER SOLUTION 04/05/18   [provider]  Nutritional Supplements (,FEEDING SUPPLEMENT, PROSOURCE PLUS) liquid Take 30 mLs by mouth 2 (two) times daily between meals. 10/25/19   Debbe Odea, MD  Nutritional Supplements (FEEDING SUPPLEMENT, KATE FARMS STANDARD 1.4,) LIQD liquid Take 325 mLs by mouth 2 (two) times daily between meals. 10/25/19   Debbe Odea, MD  nystatin (MYCOSTATIN) 100000 UNIT/ML suspension Take 5 mLs (500,000 Units total) by mouth 4 (four) times daily. 10/25/19   Debbe Odea, MD  ondansetron (ZOFRAN) 4 MG tablet Take 1 tablet (4 mg total) by mouth every 6 (six) hours as needed for nausea. 10/25/19   Debbe Odea, MD  OXYGEN Inhale 2 L into the lungs continuous.    [provider]  polyethylene glycol (MIRALAX / GLYCOLAX) 17 g packet Take 17 g by mouth daily as needed for mild constipation. 10/25/19   Debbe Odea, MD  predniSONE (DELTASONE) 10 MG tablet Take 10 mg by mouth daily with breakfast.     [provider]  senna (SENOKOT) 8.6 MG TABS tablet Take 2 tablets (17.2 mg total) by mouth at bedtime. 10/25/19   Debbe Odea, MD  CABOMETYX 40  MG tablet TAKE 1 TABLET (40MG ) BY MOUTH ONCE DAILY. TAKE ON AN EMPTY STOMACH 1 HOUR BEFORE OR 2 HOURS AFTER MEALS 01/09/19   Heilingoetter, Cassandra L, PA-C   Physical Exam: Vitals:   11/03/19 2016 11/03/19 2030 11/03/19 2100 11/03/19 2145  BP: (!) 107/57 (!) 105/53 (!) 116/59 (!) 127/59  Pulse: 89 90 94 98  Resp: 18 19 (!) 29 20  Temp:      TempSrc:      SpO2: 98% 99% 97% 98%  Weight:      Height:        Wt Readings from Last 3 Encounters:  11/03/19 53.1 kg  10/22/19 51.6 kg  10/05/19 53.5 kg    General:  Cachectic, chronically ill appearing Eyes: PERRL, normal lids, irises & conjunctiva Cardiovascular: RRR, no m/r/g. No LE edema. Telemetry: SR, no arrhythmias  Respiratory: poor air movement throughout, faint end expiratory wheezes, no crackles. Normal respiratory effort on home 2L O2. Abdomen: soft, ntnd Skin: no rash or induration seen on limited exam Musculoskeletal: grossly normal tone BUE/BLE Psychiatric: grossly normal mood and affect, speech fluent and appropriate Neurologic: grossly non-focal.          Labs on Admission:  Basic Metabolic Panel: Recent Labs  Lab 11/03/19 1624  NA 136  K 4.9  CL 102  CO2 21*  GLUCOSE 82  BUN 39*  CREATININE 2.14*  CALCIUM 7.7*   Liver Function Tests: Recent Labs  Lab 11/03/19 1624  AST 75*  ALT 45*  ALKPHOS 134*  BILITOT 1.2  PROT 5.6*  ALBUMIN 2.6*   No results for input(s): LIPASE, AMYLASE in the last 168 hours. No results for input(s): AMMONIA in the last 168 hours. CBC: Recent Labs  Lab 11/03/19 1624  WBC 12.0*  NEUTROABS 10.4*  HGB 8.4*  HCT 28.7*  MCV 101.8*  PLT 128*   Cardiac Enzymes: No results for input(s): CKTOTAL, CKMB, CKMBINDEX, TROPONINI in the last 168 hours.  BNP (last 3 results) No results for input(s):  BNP in the last 8760 hours.  ProBNP (last 3 results) No results for input(s): PROBNP in the last 8760 hours.  CBG: No results for input(s): GLUCAP in the last 168  hours.  Radiological Exams on Admission: DG Chest Port 1 View  Result Date: 11/03/2019 CLINICAL DATA:  Dyspnea, COPD, kidney cancer EXAM: PORTABLE CHEST 1 VIEW COMPARISON:  08/21/2017 chest radiograph. FINDINGS: Right internal jugular Port-A-Cath terminates over cavoatrial junction. Stable cardiomediastinal silhouette with normal heart size. No pneumothorax. No pleural effusion. Emphysema. No pulmonary edema. Stable upper right parahilar parenchymal band. New thin parenchymal band in infrahilar left lung. No acute consolidative airspace disease. IMPRESSION: Emphysema. New mild linear scarring versus atelectasis in the infrahilar left lung. No acute consolidative airspace disease to suggest a pneumonia. Electronically Signed   By: Ilona Sorrel M.D.   On: 11/03/2019 17:53    EKG: Independently reviewed.  Normal sinus rhythm, overall unremarkable.  Assessment/Plan Active Problems:   COPD, very severe (HCC)   HTN (hypertension)   Renal cell carcinoma (HCC)   Chronic hypoxemic respiratory failure ([place])   Palliative care by specialist   AKI (acute kidney injury) ([place])   #AKI Creatinine increased from 1.4-2.1 since discharge last week.  Likely prerenal secondary to poor p.o. intake due to combination of advanced malignancy, multiple medical comorbidities, and breakthrough Covid infection.  Status post 1 L IV fluids in the ED, will give normal saline infusion overnight and recheck BMP in the morning. -Normal saline at 125 cc an hour -Trend BMP  #COVID-19 Infection Incidental finding, as I do not believe this is the main cause of her AKI.  I do not believe she is having significant respiratory symptoms either, as she is at her baseline home O2 of 2 L and reports that her shortness of breath is unchanged from her baseline.  Regardless patient is at high risk for progression, discussed with pharmacy and she is a candidate for Regeneron which has been ordered.  #Renal Cell Carcinoma Per discharge  summary from last admission, patient went home on palliative with plan to transition to hospice.  My discussions with her it does appear that she has engaged in some form of hospice as a hospital bed has been brought to her home.  Unclear however that she is fully engaged with hospice or totally clear on its goals, would gather further collateral from family as well as her hospice agency in the a.m. and clarify patient's goals of care.  #Chronic Medical problems COPD: Continue ipratropium and albuterol inhalers Hypertension: Hold diltiazem due to soft pressures on admission GERD: continue PPI Anemia: Hemoglobin stable at 8.4 since last admission Anxiety/depression: Continue mirtazapine  Code Status: DNR, confirmed DVT Prophylaxis: lovenox Family Communication: none Disposition Plan: Obs, likely d/c in AM if Cr improved  Time spent: 50 min  Clarnce Flock MD/[facility]

## 2019-11-03 NOTE — ED Provider Notes (Signed)
Melanie Best   CSN: [id number] Arrival date & time: 11/03/19  1558     History Chief Complaint  Patient presents with  . Shortness of Breath  . Fatigue    Honesty Melanie Best is a 74 y.o. female.  74 year old female with prior medical history as detailed below presents for evaluation of malaise, fatigue, and nausea.  Patient reports worsening of symptoms over the last 48 hours.  Patient denies chest pain.  Patient denies fever.  Patient reports chronic cough.  She reports home use of 2 L nasal cannula O2.  She is not changed this in the last 48 hours.  Of Best, patient reports that she lives with her husband and son.  Her son has been hospitalized for Covid infection over the last week.  Her son was her primary caregiver.  The history is provided by the patient and medical records.  Illness Location:  Malaise, fatigue, nausea Severity:  Moderate Onset quality:  Gradual Duration:  2 days Timing:  Constant Progression:  Worsening Chronicity:  New Associated symptoms: shortness of breath   Associated symptoms: no abdominal pain, no chest pain and no fever        Past Medical History:  Diagnosis Date  . Anemia   . Anxiety   . Arthritis    R leg  . Cancer ([place])    kidney  . Chronic fatigue 12/13/2016  . CKD (chronic kidney disease), stage II    removed R kidney- 2010- for Cancer, followed by Dr. Diona Fanti  . COPD (chronic obstructive pulmonary disease) (HCC)    uses O2-2 liters , continuously , followed by Dr. Chase Caller  . Cough 08-02-2015  . Diabetes mellitus, type 2 ([place])   . DM (diabetes mellitus) ([place]) 01/27/2012  . DVT (deep venous thrombosis), left    2011- treated /w coumadin   . Dyslipidemia   . Dysrhythmia   . Esophageal reflux   . GERD (gastroesophageal reflux disease) 01/27/2012  . Glaucoma   . Goals of care, counseling/discussion 12/13/2016  . Hemoptysis 08-02-15  . History of radiation therapy  01/09/12,01/11/12,01/15/12,01/17/12,& 01/19/12   rul lung,50Gy/39fx  . HTN (hypertension) 01/27/2012  . Hyperlipidemia 01/27/2012  . Hypertension    sees Dr. Deforest Hoyles for PCP, denies ever having stress or ECHO, ?when & where she had  last  EKG  . Leukocytosis   . Lung cancer ([place])    NSCLC  . Osteopenia   . Renal cell carcinoma of right kidney ([place]) 12/13/2016  . Shortness of breath   . Syncope   . Tremor   . Urinary incontinence   . Vitamin D deficiency     Patient Active Problem List   Diagnosis Date Noted  . Metastatic renal cell carcinoma, left ([place]) 10/24/2019  . Gross hematuria 10/24/2019  . Palliative care by specialist   . General weakness   . Hematuria 10/23/2019  . Weight loss, abnormal 10/23/2019  . Acute blood loss anemia 10/22/2019  . Insomnia 11/26/2018  . Decreased appetite 11/26/2018  . Hypokalemia 02/11/2018  . Encounter for antineoplastic chemotherapy 07/25/2017  . Port-A-Cath in place 04/05/2017  . Chronic hypoxemic respiratory failure ([place]) 01/15/2017  . Renal cell carcinoma of right kidney ([place]) 12/13/2016  . Chronic fatigue 12/13/2016  . Goals of care, counseling/discussion 12/13/2016  . Encounter for antineoplastic immunotherapy 12/13/2016  . Malignant neoplasm of left lung ([place]) 01/13/2015  . Renal insufficiency 06/30/2014  . Renal cell carcinoma ([place]) 06/30/2014  . COPD, severe ([place]) 09/13/2013  .  Malignant neoplasm of lower lobe of left lung ([place]) 09/09/2013  . Malignant neoplasm of upper lobe of right lung ([place]) 09/09/2013  . Lung nodule, left upper lobe and left lower lobe 03/16/2012  . COPD exacerbation ([place]) 03/16/2012  . Chest pain 01/27/2012  . Leukocytosis 01/27/2012  . Hyperlipidemia 01/27/2012  . HTN (hypertension) 01/27/2012  . Anxiety 01/27/2012  . GERD (gastroesophageal reflux disease) 01/27/2012  . RUL NSCLC Prsumed STage 1A 10/13/2011  . COPD, very severe ([place]) 08/05/2011    Past Surgical History:  Procedure Laterality Date  .  BACK SURGERY     for pinched nerve, 2011, here at [facility]  . EYE SURGERY     cataracts removed, ?IOL  . FLEXIBLE BRONCHOSCOPY  11/29/2011  . IR FLUORO GUIDE PORT INSERTION RIGHT  01/30/2017  . IR US GUIDE VASC ACCESS RIGHT  01/30/2017  . removed blood clot     left upper abd quad   . right kidney removed    . right nephrectomy       OB History   No obstetric history on file.     Family History  Problem Relation Age of Onset  . Emphysema Father   . Cancer Sister        behind eye  . Cancer Brother        lung  . Cancer Brother        lung  . Cancer Brother        throat  . Breast cancer Neg Hx     Social History   Tobacco Use  . Smoking status: Former Smoker    Packs/day: 1.00    Years: 35.00    Pack years: 35.00    Types: Cigarettes    Quit date: 03/20/2004    Years since quitting: 15.6  . Smokeless tobacco: Never Used  Vaping Use  . Vaping Use: Never used  Substance Use Topics  . Alcohol use: No  . Drug use: No    Home Medications Prior to Admission medications   Medication Sig Start Date End Date Taking? Authorizing Provider  acetaminophen (TYLENOL) 325 MG tablet Take 2 tablets (650 mg total) by mouth every 6 (six) hours as needed for mild pain (or Fever >/= 101). 10/25/19   Debbe Odea, MD  bisacodyl (DULCOLAX) 10 MG suppository Place 1 suppository (10 mg total) rectally as needed for moderate constipation. 10/25/19   Debbe Odea, MD  brinzolamide (AZOPT) 1 % ophthalmic suspension Place 1 drop into both eyes 3 (three) times daily. Reported on 08/12/2015    [provider]  diltiazem (CARDIZEM CD) 180 MG 24 hr capsule Take 180 mg by mouth daily. 04/14/14   [provider]  ergocalciferol (VITAMIN D2) 50000 UNITS capsule Take 50,000 Units by mouth 2 (two) times a week. Take on Monday & thursday    [provider]  esomeprazole (NEXIUM) 40 MG capsule Take 40 mg by mouth daily. 04/14/19   [provider]  fentaNYL (DURAGESIC) 12  MCG/HR Place 1 patch onto the skin every 3 (three) days. 10/27/19   Debbe Odea, MD  ferrous sulfate 325 (65 FE) MG EC tablet Take 1 tablet (325 mg total) by mouth 2 (two) times daily. 10/25/19 10/24/20  Debbe Odea, MD  ipratropium-albuterol (DUONEB) 0.5-2.5 (3) MG/3ML SOLN Take 3 mLs by nebulization 4 (four) times daily as needed. Patient taking differently: Take 3 mLs by nebulization 4 (four) times daily as needed (SOB/Wheezing).  01/13/15   Brand Males, MD  lidocaine-prilocaine (EMLA)  cream Apply 1 application topically as needed (port access).     [provider]  LUMIGAN 0.01 % SOLN Place 1 drop into both eyes daily.  07/26/18   [provider]  mirtazapine (REMERON) 7.5 MG tablet Take 1 tablet (7.5 mg total) by mouth daily. 06/12/19   Heilingoetter, Cassandra L, PA-C  morphine 10 MG/5ML solution Take 2.5 mLs (5 mg total) by mouth every 3 (three) hours as needed for severe pain (or shortness of breath.). 10/25/19   Debbe Odea, MD  Multiple Vitamin (MULTIVITAMIN WITH MINERALS) TABS tablet Take 1 tablet by mouth daily. 10/26/19   Debbe Odea, MD  Nebulizers (VIOS AEROSOL DELIVERY SYSTEM) MISC USE UTD BY MD WITH NEBULIZER SOLUTION 04/05/18   [provider]  Nutritional Supplements (,FEEDING SUPPLEMENT, PROSOURCE PLUS) liquid Take 30 mLs by mouth 2 (two) times daily between meals. 10/25/19   Debbe Odea, MD  Nutritional Supplements (FEEDING SUPPLEMENT, KATE FARMS STANDARD 1.4,) LIQD liquid Take 325 mLs by mouth 2 (two) times daily between meals. 10/25/19   Debbe Odea, MD  nystatin (MYCOSTATIN) 100000 UNIT/ML suspension Take 5 mLs (500,000 Units total) by mouth 4 (four) times daily. 10/25/19   Debbe Odea, MD  ondansetron (ZOFRAN) 4 MG tablet Take 1 tablet (4 mg total) by mouth every 6 (six) hours as needed for nausea. 10/25/19   Debbe Odea, MD  OXYGEN Inhale 2 L into the lungs continuous.    [provider]  polyethylene glycol (MIRALAX / GLYCOLAX) 17 g packet  Take 17 g by mouth daily as needed for mild constipation. 10/25/19   Debbe Odea, MD  predniSONE (DELTASONE) 10 MG tablet Take 10 mg by mouth daily with breakfast.     [provider]  senna (SENOKOT) 8.6 MG TABS tablet Take 2 tablets (17.2 mg total) by mouth at bedtime. 10/25/19   Debbe Odea, MD  CABOMETYX 40 MG tablet TAKE 1 TABLET (40MG ) BY MOUTH ONCE DAILY. TAKE ON AN EMPTY STOMACH 1 HOUR BEFORE OR 2 HOURS AFTER MEALS 01/09/19   Heilingoetter, Cassandra L, PA-C    Allergies    Patient has no known allergies.  Review of Systems   Review of Systems  Constitutional: Negative for fever.  Respiratory: Positive for shortness of breath.   Cardiovascular: Negative for chest pain.  Gastrointestinal: Negative for abdominal pain.  All other systems reviewed and are negative.   Physical Exam Updated Vital Signs BP (!) 97/46   Pulse 91   Temp 98.1 F (36.7 C) (Oral)   Resp 18   Ht 5\' 5"  (1.651 m)   Wt 53.1 kg   SpO2 99%   BMI 19.47 kg/m   Physical Exam Vitals and nursing Best reviewed.  Constitutional:      General: She is not in acute distress.    Appearance: She is ill-appearing.     Comments: Chronically ill in appearance  HENT:     Head: Normocephalic and atraumatic.  Eyes:     Conjunctiva/sclera: Conjunctivae normal.     Pupils: Pupils are equal, round, and reactive to light.  Cardiovascular:     Rate and Rhythm: Normal rate and regular rhythm.     Heart sounds: Normal heart sounds.  Pulmonary:     Effort: Pulmonary effort is normal. No respiratory distress.     Breath sounds: Normal breath sounds.  Abdominal:     General: There is no distension.     Palpations: Abdomen is soft.     Tenderness: There is no abdominal tenderness.  Musculoskeletal:        General: No deformity. Normal range of motion.     Cervical back: Normal range of motion and neck supple.  Skin:    General: Skin is warm and dry.  Neurological:     General: No focal deficit present.      Mental Status: She is alert and oriented to person, place, and time.     ED Results / Procedures / Treatments   Labs (all labs ordered are listed, but only abnormal results are displayed) Labs Reviewed  COMPREHENSIVE METABOLIC PANEL - Abnormal; Notable for the following components:      Result Value   CO2 21 (*)    BUN 39 (*)    Creatinine, Ser 2.14 (*)    Calcium 7.7 (*)    Total Protein 5.6 (*)    Albumin 2.6 (*)    AST 75 (*)    ALT 45 (*)    Alkaline Phosphatase 134 (*)    GFR calc non Af Amer 22 (*)    GFR calc Af Amer 26 (*)    All other components within normal limits  CBC WITH DIFFERENTIAL/PLATELET - Abnormal; Notable for the following components:   WBC 12.0 (*)    RBC 2.82 (*)    Hemoglobin 8.4 (*)    HCT 28.7 (*)    MCV 101.8 (*)    MCHC 29.3 (*)    RDW 18.2 (*)    Platelets 128 (*)    All other components within normal limits  SARS CORONAVIRUS 2 BY RT PCR (HOSPITAL ORDER, [facility])  PROTIME-INR  LACTIC ACID, PLASMA  URINALYSIS, ROUTINE W REFLEX MICROSCOPIC  TYPE AND SCREEN    EKG EKG Interpretation  Date/Time:  Monday November 03 2019 16:33:57 EDT Ventricular Rate:  88 PR Interval:    QRS Duration: 85 QT Interval:  388 QTC Calculation: 470 R Axis:   71 Text Interpretation: Sinus rhythm Confirmed by Dene Gentry [phone]) on 11/03/2019 4:37:07 PM   Radiology DG Chest Port 1 View  Result Date: 11/03/2019 CLINICAL DATA:  Dyspnea, COPD, kidney cancer EXAM: PORTABLE CHEST 1 VIEW COMPARISON:  08/21/2017 chest radiograph. FINDINGS: Right internal jugular Port-A-Cath terminates over cavoatrial junction. Stable cardiomediastinal silhouette with normal heart size. No pneumothorax. No pleural effusion. Emphysema. No pulmonary edema. Stable upper right parahilar parenchymal band. New thin parenchymal band in infrahilar left lung. No acute consolidative airspace disease. IMPRESSION: Emphysema. New mild linear scarring versus atelectasis  in the infrahilar left lung. No acute consolidative airspace disease to suggest a pneumonia. Electronically Signed   By: Ilona Sorrel M.D.   On: 11/03/2019 17:53    Procedures Procedures (including critical care time)  Medications Ordered in ED Medications  sodium chloride 0.9 % bolus 1,000 mL (1,000 mLs Intravenous New Bag/Given (Non-Interop) 11/03/19 1640)    ED Course  I have reviewed the triage vital signs and the nursing notes.  Pertinent labs & imaging results that were available during my care of the patient were reviewed by me and considered in my medical decision making (see chart for details).    MDM Rules/Calculators/A&P                          MDM  Screen complete  Ciclaly Mulcahey was evaluated in Emergency Department on 11/03/2019 for the symptoms described in the history of present illness. She was evaluated in the context of the global COVID-19 pandemic, which  necessitated consideration that the patient might be at risk for infection with the SARS-CoV-2 virus that causes COVID-19. Institutional protocols and algorithms that pertain to the evaluation of patients at risk for COVID-19 are in a state of rapid change based on information released by regulatory bodies including the CDC and federal and state organizations. These policies and algorithms were followed during the patient's care in the ED.  Patient is presenting for evaluation of suspected dehydration.   She will require admission for further workup and treatment.   Hospitalist service is aware of case and will evaluate for admission.    Final Clinical Impression(s) / ED Diagnoses Final diagnoses:  Dehydration  AKI (acute kidney injury) [facility])    Rx / DC Orders ED Discharge Orders    None       Valarie Merino, MD 11/03/19 1843

## 2019-11-04 ENCOUNTER — Observation Stay (HOSPITAL_COMMUNITY)

## 2019-11-04 ENCOUNTER — Inpatient Hospital Stay (HOSPITAL_COMMUNITY)

## 2019-11-04 DIAGNOSIS — I10 Essential (primary) hypertension: Secondary | ICD-10-CM | POA: Diagnosis not present

## 2019-11-04 DIAGNOSIS — G4700 Insomnia, unspecified: Secondary | ICD-10-CM | POA: Diagnosis present

## 2019-11-04 DIAGNOSIS — K219 Gastro-esophageal reflux disease without esophagitis: Secondary | ICD-10-CM | POA: Diagnosis present

## 2019-11-04 DIAGNOSIS — F329 Major depressive disorder, single episode, unspecified: Secondary | ICD-10-CM | POA: Diagnosis present

## 2019-11-04 DIAGNOSIS — Z7189 Other specified counseling: Secondary | ICD-10-CM

## 2019-11-04 DIAGNOSIS — Y95 Nosocomial condition: Secondary | ICD-10-CM | POA: Diagnosis present

## 2019-11-04 DIAGNOSIS — U071 COVID-19: Secondary | ICD-10-CM | POA: Diagnosis present

## 2019-11-04 DIAGNOSIS — J9621 Acute and chronic respiratory failure with hypoxia: Secondary | ICD-10-CM | POA: Diagnosis present

## 2019-11-04 DIAGNOSIS — N1832 Chronic kidney disease, stage 3b: Secondary | ICD-10-CM | POA: Diagnosis present

## 2019-11-04 DIAGNOSIS — E860 Dehydration: Secondary | ICD-10-CM | POA: Diagnosis present

## 2019-11-04 DIAGNOSIS — Z23 Encounter for immunization: Secondary | ICD-10-CM | POA: Diagnosis not present

## 2019-11-04 DIAGNOSIS — Y92239 Unspecified place in hospital as the place of occurrence of the external cause: Secondary | ICD-10-CM | POA: Diagnosis not present

## 2019-11-04 DIAGNOSIS — J9601 Acute respiratory failure with hypoxia: Secondary | ICD-10-CM | POA: Diagnosis not present

## 2019-11-04 DIAGNOSIS — D539 Nutritional anemia, unspecified: Secondary | ICD-10-CM | POA: Diagnosis present

## 2019-11-04 DIAGNOSIS — E1122 Type 2 diabetes mellitus with diabetic chronic kidney disease: Secondary | ICD-10-CM | POA: Diagnosis present

## 2019-11-04 DIAGNOSIS — E785 Hyperlipidemia, unspecified: Secondary | ICD-10-CM | POA: Diagnosis present

## 2019-11-04 DIAGNOSIS — E559 Vitamin D deficiency, unspecified: Secondary | ICD-10-CM | POA: Diagnosis present

## 2019-11-04 DIAGNOSIS — Z515 Encounter for palliative care: Secondary | ICD-10-CM | POA: Diagnosis present

## 2019-11-04 DIAGNOSIS — Z66 Do not resuscitate: Secondary | ICD-10-CM | POA: Diagnosis not present

## 2019-11-04 DIAGNOSIS — F419 Anxiety disorder, unspecified: Secondary | ICD-10-CM | POA: Diagnosis present

## 2019-11-04 DIAGNOSIS — E876 Hypokalemia: Secondary | ICD-10-CM | POA: Diagnosis present

## 2019-11-04 DIAGNOSIS — N179 Acute kidney failure, unspecified: Secondary | ICD-10-CM | POA: Diagnosis present

## 2019-11-04 DIAGNOSIS — I82412 Acute embolism and thrombosis of left femoral vein: Secondary | ICD-10-CM | POA: Diagnosis present

## 2019-11-04 DIAGNOSIS — C649 Malignant neoplasm of unspecified kidney, except renal pelvis: Secondary | ICD-10-CM | POA: Diagnosis not present

## 2019-11-04 DIAGNOSIS — Z681 Body mass index (BMI) 19 or less, adult: Secondary | ICD-10-CM | POA: Diagnosis not present

## 2019-11-04 DIAGNOSIS — J449 Chronic obstructive pulmonary disease, unspecified: Secondary | ICD-10-CM | POA: Diagnosis not present

## 2019-11-04 DIAGNOSIS — R7989 Other specified abnormal findings of blood chemistry: Secondary | ICD-10-CM | POA: Diagnosis not present

## 2019-11-04 DIAGNOSIS — C642 Malignant neoplasm of left kidney, except renal pelvis: Secondary | ICD-10-CM | POA: Diagnosis present

## 2019-11-04 DIAGNOSIS — J9611 Chronic respiratory failure with hypoxia: Secondary | ICD-10-CM | POA: Diagnosis not present

## 2019-11-04 DIAGNOSIS — Z85118 Personal history of other malignant neoplasm of bronchus and lung: Secondary | ICD-10-CM | POA: Diagnosis not present

## 2019-11-04 DIAGNOSIS — K5900 Constipation, unspecified: Secondary | ICD-10-CM | POA: Diagnosis present

## 2019-11-04 DIAGNOSIS — Z923 Personal history of irradiation: Secondary | ICD-10-CM | POA: Diagnosis not present

## 2019-11-04 DIAGNOSIS — J1282 Pneumonia due to coronavirus disease 2019: Secondary | ICD-10-CM | POA: Diagnosis present

## 2019-11-04 LAB — BASIC METABOLIC PANEL
Anion gap: 14 (ref 5–15)
BUN: 36 mg/dL — ABNORMAL HIGH (ref 8–23)
CO2: 18 mmol/L — ABNORMAL LOW (ref 22–32)
Calcium: 7.1 mg/dL — ABNORMAL LOW (ref 8.9–10.3)
Chloride: 108 mmol/L (ref 98–111)
Creatinine, Ser: 1.69 mg/dL — ABNORMAL HIGH (ref 0.44–1.00)
GFR calc Af Amer: 35 mL/min — ABNORMAL LOW (ref >60)
GFR calc non Af Amer: 30 mL/min — ABNORMAL LOW (ref >60)
Glucose, Bld: 71 mg/dL (ref 70–99)
Potassium: 4.6 mmol/L (ref 3.5–5.1)
Sodium: 140 mmol/L (ref 135–145)

## 2019-11-04 LAB — URINALYSIS, ROUTINE W REFLEX MICROSCOPIC
Bilirubin Urine: NEGATIVE
Glucose, UA: NEGATIVE mg/dL
Ketones, ur: NEGATIVE mg/dL
Nitrite: NEGATIVE
Protein, ur: 30 mg/dL — AB
Specific Gravity, Urine: 1.01 (ref 1.005–1.030)
WBC, UA: >50 WBC/hpf — ABNORMAL HIGH (ref 0–5)
pH: 8 (ref 5.0–8.0)

## 2019-11-04 LAB — CBC
HCT: 28.2 % — ABNORMAL LOW (ref 36.0–46.0)
Hemoglobin: 8.2 g/dL — ABNORMAL LOW (ref 12.0–15.0)
MCH: 30 pg (ref 26.0–34.0)
MCHC: 29.1 g/dL — ABNORMAL LOW (ref 30.0–36.0)
MCV: 103.3 fL — ABNORMAL HIGH (ref 80.0–100.0)
Platelets: 130 10*3/uL — ABNORMAL LOW (ref 150–400)
RBC: 2.73 MIL/uL — ABNORMAL LOW (ref 3.87–5.11)
RDW: 18 % — ABNORMAL HIGH (ref 11.5–15.5)
WBC: 11.6 10*3/uL — ABNORMAL HIGH (ref 4.0–10.5)
nRBC: 0 % (ref 0.0–0.2)

## 2019-11-04 LAB — CBG MONITORING, ED
Glucose-Capillary: 66 mg/dL — ABNORMAL LOW (ref 70–99)
Glucose-Capillary: 94 mg/dL (ref 70–99)
Glucose-Capillary: 103 mg/dL — ABNORMAL HIGH (ref 70–99)

## 2019-11-04 LAB — PROCALCITONIN: Procalcitonin: 6.86 ng/mL

## 2019-11-04 LAB — D-DIMER, QUANTITATIVE: D-Dimer, Quant: 6.56 ug/mL-FEU — ABNORMAL HIGH (ref 0.00–0.50)

## 2019-11-04 LAB — C-REACTIVE PROTEIN: CRP: 20.6 mg/dL — ABNORMAL HIGH (ref <1.0)

## 2019-11-04 LAB — STREP PNEUMONIAE URINARY ANTIGEN: Strep Pneumo Urinary Antigen: NEGATIVE

## 2019-11-04 LAB — HIV ANTIBODY (ROUTINE TESTING W REFLEX): HIV Screen 4th Generation wRfx: NONREACTIVE

## 2019-11-04 LAB — FERRITIN: Ferritin: 2067 ng/mL — ABNORMAL HIGH (ref 11–307)

## 2019-11-04 MED ORDER — HYDROMORPHONE HCL 1 MG/ML IJ SOLN: 0.5000 mg | INTRAMUSCULAR | Status: DC | PRN

## 2019-11-04 MED ORDER — SENNA 8.6 MG PO TABS
1.0000 | ORAL_TABLET | Freq: Every day | ORAL | Status: DC
  Administered 2019-11-04 – 2019-11-06 (×3): 8.6 mg via ORAL
  Filled 2019-11-04 (×3): qty 1

## 2019-11-04 MED ORDER — DEXTROSE IN LACTATED RINGERS 5 % IV SOLN: INTRAVENOUS | Status: DC

## 2019-11-04 MED ORDER — SODIUM CHLORIDE 0.9 % IV SOLN: 2.0000 g | INTRAVENOUS | Status: DC

## 2019-11-04 MED ORDER — HEPARIN SODIUM (PORCINE) 5000 UNIT/ML IJ SOLN
5000.0000 [IU] | Freq: Two times a day (BID) | INTRAMUSCULAR | Status: DC
  Administered 2019-11-04 – 2019-11-11 (×14): 5000 [IU] via SUBCUTANEOUS
  Filled 2019-11-04 (×15): qty 1

## 2019-11-04 MED ORDER — ALPRAZOLAM 0.25 MG PO TABS
0.2500 mg | ORAL_TABLET | Freq: Two times a day (BID) | ORAL | Status: DC | PRN
  Administered 2019-11-06: 0.25 mg via ORAL
  Filled 2019-11-04: qty 1

## 2019-11-04 MED ORDER — SODIUM CHLORIDE 0.9 % IV SOLN
200.0000 mg | Freq: Once | INTRAVENOUS | Status: AC
  Administered 2019-11-04: 200 mg via INTRAVENOUS
  Filled 2019-11-04: qty 200

## 2019-11-04 MED ORDER — SODIUM CHLORIDE 0.9 % IV SOLN
2.0000 g | Freq: Every day | INTRAVENOUS | Status: AC
  Administered 2019-11-04 – 2019-11-10 (×7): 2 g via INTRAVENOUS
  Filled 2019-11-04 (×7): qty 2

## 2019-11-04 MED ORDER — VANCOMYCIN HCL 500 MG/100ML IV SOLN
500.0000 mg | INTRAVENOUS | Status: DC
  Filled 2019-11-04: qty 100

## 2019-11-04 MED ORDER — SODIUM CHLORIDE 0.9 % IV SOLN
100.0000 mg | Freq: Every day | INTRAVENOUS | Status: AC
  Administered 2019-11-05 – 2019-11-08 (×4): 100 mg via INTRAVENOUS
  Filled 2019-11-04 (×4): qty 20

## 2019-11-04 MED ORDER — DEXTROSE 50 % IV SOLN
INTRAVENOUS | Status: AC
  Administered 2019-11-04: 12.5 g
  Filled 2019-11-04: qty 50

## 2019-11-04 MED ORDER — METHYLPREDNISOLONE SODIUM SUCC 40 MG IJ SOLR
40.0000 mg | Freq: Two times a day (BID) | INTRAMUSCULAR | Status: DC
  Administered 2019-11-04 – 2019-11-08 (×10): 40 mg via INTRAVENOUS
  Filled 2019-11-04 (×10): qty 1

## 2019-11-04 MED ORDER — VANCOMYCIN HCL IN DEXTROSE 1-5 GM/200ML-% IV SOLN
1000.0000 mg | Freq: Once | INTRAVENOUS | Status: AC
  Administered 2019-11-04: 1000 mg via INTRAVENOUS
  Filled 2019-11-04: qty 200

## 2019-11-04 MED ORDER — AZITHROMYCIN 250 MG PO TABS
500.0000 mg | ORAL_TABLET | Freq: Every day | ORAL | Status: AC
  Administered 2019-11-04 – 2019-11-08 (×5): 500 mg via ORAL
  Filled 2019-11-04 (×5): qty 2

## 2019-11-04 NOTE — Consult Note (Addendum)
Consultation Note Date: 11/04/2019   Patient Name: Melanie Best  DOB: 10-15-1945  MRN: [id number]  Age / Sex: 74 y.o., female  PCP: [name], MD Referring Physician: Elodia Florence., *  Reason for Consultation: Establishing goals of care  HPI/Patient Profile: 74 y.o. female  with past medical history of left renal cell carcinoma with invasion into left renal vein and IVC with thrombus, severe COPD on home oxygen 2L, HTN, HLD, CKD stage 2, diabetes admitted on 11/03/2019 with fatigue and found to have COVID pneumonia. Oxygen requirements increasing.   Clinical Assessment and Goals of Care: I met today at Melanie Best's bedside. She had recent hospitalization and palliative consultation with plans to have hospice after completion of radiation therapy. Melanie Best shares that she was recently set up with hospice and they have brought equipment out to her home. Unfortunately she now has COVID (along with many of her family members) and feels poorly. She complains of left flank pain that is intermittent and not new. She has poor intake. Complains of dry mouth which increasing oxygen requirements is not helping.   We discussed GOC and she confirms DNR. She does not desire invasive, aggressive measures. She does not desire surgery or procedures. She would like to continue oxygen and medications for COVID pneumonia. We also discussed addition of comfort medications to assist with pain, shortness of breath, insomnia, etc. She agrees that she wishes for comfort medications along with conservative care for COVID. She gives me permission to call her husband for update.   I called husband and updated that Melanie Best is resting and we are ensuring that we are also keeping her comfortably. I educated that we are treating her for COVID pneumonia. Melanie Best has COVID himself and not feeling well. I updated him on the  above conversation and goals that she expressed to me and Best confirms desire for conservative care with comfort medications. They are not interested in aggressive measures. Best confirms that she was started on hospice care at home recently via [place]. Best would like to be notified of any changes. I attempted to obtain other phone numbers (they have 4 children) but Best does not have numbers close and does not feel up to obtaining to numbers. Best is okay with [place] calling any of the children if numbers obtained.   All questions/concerns addressed. Emotional support provided. Updated Dr. Florene Glen.   Primary Decision Maker PATIENT    SUMMARY OF RECOMMENDATIONS   - DNR - Continue oxygen and COVID medications  Code Status/Advance Care Planning:  DNR   Symptom Management:   Pain/SOB: Dilaudid 0.5-1 mg every 2 hours prn. Senokot daily.   No plans for IVC filter. Medications per attending.   Anxiety: Xanax 0.25 mg BID prn.   Trazodone for insomnia.   Palliative Prophylaxis:   Aspiration, Bowel Regimen, Delirium Protocol, Frequent Pain Assessment, Oral Care and Turn Reposition  Psycho-social/Spiritual:   Desire for further Chaplaincy support:yes  Additional Recommendations: Caregiving  Support/Resources and Grief/Bereavement Support  Prognosis:  Overall prognosis poor with terminal cancer on hospice complicated by COVID pneumonia.   Discharge Planning: To Be Determined      Primary Diagnoses: Present on Admission: . AKI (acute kidney injury) ([place]) . HTN (hypertension) . COPD, very severe ([place]) . Renal cell carcinoma ([place]) . Chronic hypoxemic respiratory failure ([place]) . COVID-19   I have reviewed the medical record, interviewed the patient and family, and examined the patient. The following aspects are pertinent.  Past Medical History:  Diagnosis Date  . Anemia   . Anxiety   . Arthritis    R leg  . Cancer ([place])    kidney  . Chronic fatigue 12/13/2016  . CKD  (chronic kidney disease), stage II    removed R kidney- 2010- for Cancer, followed by Dr. Diona Fanti  . COPD (chronic obstructive pulmonary disease) (HCC)    uses O2-2 liters , continuously , followed by Dr. Chase Caller  . Cough 08-02-2015  . Diabetes mellitus, type 2 ([place])   . DM (diabetes mellitus) ([place]) 01/27/2012  . DVT (deep venous thrombosis), left    2011- treated /w coumadin   . Dyslipidemia   . Dysrhythmia   . Esophageal reflux   . GERD (gastroesophageal reflux disease) 01/27/2012  . Glaucoma   . Goals of care, counseling/discussion 12/13/2016  . Hemoptysis 08-02-15  . History of radiation therapy 01/09/12,01/11/12,01/15/12,01/17/12,& 01/19/12   rul lung,50Gy/35f  . HTN (hypertension) 01/27/2012  . Hyperlipidemia 01/27/2012  . Hypertension    sees Dr. HDeforest Hoylesfor PCP, denies ever having stress or ECHO, ?when & where she had  last  EKG  . Leukocytosis   . Lung cancer ([place]    NSCLC  . Osteopenia   . Renal cell carcinoma of right kidney ([place] 12/13/2016  . Shortness of breath   . Syncope   . Tremor   . Urinary incontinence   . Vitamin D deficiency    Social History   Socioeconomic History  . Marital status: Married    Spouse name: Not on file  . Number of children: Not on file  . Years of education: Not on file  . Highest education level: Not on file  Occupational History  . Not on file  Tobacco Use  . Smoking status: Former Smoker    Packs/day: 1.00    Years: 35.00    Pack years: 35.00    Types: Cigarettes    Quit date: 03/20/2004    Years since quitting: 15.6  . Smokeless tobacco: Never Used  Vaping Use  . Vaping Use: Never used  Substance and Sexual Activity  . Alcohol use: No  . Drug use: No  . Sexual activity: Not on file  Other Topics Concern  . Not on file  Social History Narrative  . Not on file   Social Determinants of Health   Financial Resource Strain:   . Difficulty of Paying Living Expenses:   Food Insecurity:   . Worried About RSales executivein the Last Year:   . RArboriculturistin the Last Year:   Transportation Needs:   . LFilm/video editor(Medical):   .Marland KitchenLack of Transportation (Non-Medical):   Physical Activity:   . Days of Exercise per Week:   . Minutes of Exercise per Session:   Stress:   . Feeling of Stress :   Social Connections:   . Frequency of Communication with Friends and Family:   . Frequency of Social Gatherings with Friends and Family:   .  Attends Religious Services:   . Active Member of Clubs or Organizations:   . Attends Archivist Meetings:   Marland Kitchen Marital Status:    Family History  Problem Relation Age of Onset  . Emphysema Father   . Cancer Sister        behind eye  . Cancer Brother        lung  . Cancer Brother        lung  . Cancer Brother        throat  . Breast cancer Neg Hx    Scheduled Meds: . azithromycin  500 mg Oral Daily  . brinzolamide  1 drop Both Eyes TID  . heparin injection (subcutaneous)  5,000 Units Subcutaneous Q12H  . ipratropium  2 puff Inhalation Q4H  . latanoprost  1 drop Both Eyes QHS  . methylPREDNISolone (SOLU-MEDROL) injection  40 mg Intravenous Q12H  . mirtazapine  7.5 mg Oral Daily  . pantoprazole  80 mg Oral Q1200   Continuous Infusions: . sodium chloride    . ceFEPime (MAXIPIME) IV Stopped (11/04/19 1330)  . dextrose 5% lactated ringers 50 mL/hr at 11/04/19 0952  . famotidine (PEPCID) IV    . [START ON 11/05/2019] remdesivir 100 mg in NS 100 mL    . [START ON 11/05/2019] vancomycin     PRN Meds:.sodium chloride, acetaminophen **OR** acetaminophen, albuterol, diphenhydrAMINE, EPINEPHrine, famotidine (PEPCID) IV, HYDROmorphone (DILAUDID) injection, methylPREDNISolone (SOLU-MEDROL) injection, ondansetron **OR** ondansetron (ZOFRAN) IV, oxyCODONE, polyethylene glycol, traZODone No Known Allergies Review of Systems  Constitutional: Positive for activity change, appetite change and fatigue.  Gastrointestinal: Negative for nausea and vomiting.        L flank pain  Neurological: Positive for weakness.    Physical Exam Vitals and nursing note reviewed.  Constitutional:      General: She is not in acute distress.    Appearance: She is cachectic. She is ill-appearing.     Comments: Extremely fatigued  Cardiovascular:     Rate and Rhythm: Normal rate.  Pulmonary:     Effort: No tachypnea, accessory muscle usage or respiratory distress.  Abdominal:     Palpations: Abdomen is soft.  Neurological:     Mental Status: She is alert and oriented to person, place, and time.     Vital Signs: BP (!) 101/54   Pulse 88   Temp 98.2 F (36.8 C)   Resp 13   Ht '5\' 5"'  (1.651 m)   Wt 53.1 kg   SpO2 98%   BMI 19.47 kg/m  Pain Scale: 0-10   Pain Score: 3    SpO2: SpO2: 98 % O2 Device:SpO2: 98 % O2 Flow Rate: .O2 Flow Rate (L/min): 6 L/min  IO: Intake/output summary:   Intake/Output Summary (Last 24 hours) at 11/04/2019 1638 Last data filed at 11/04/2019 1428 Gross per 24 hour  Intake 3408.5 ml  Output --  Net 3408.5 ml    LBM:   Baseline Weight: Weight: 53.1 kg Most recent weight: Weight: 53.1 kg     Palliative Assessment/Data:     Time In: 1400 Time Out: 1510 Time Total: 70 min Greater than 50%  of this time was spent counseling and coordinating care related to the above assessment and plan.  Signed by: Vinie Sill, NP Palliative Medicine Team Pager # [phone] (M-F 8a-5p) Team Phone # [phone] (Nights/Weekends)

## 2019-11-04 NOTE — Progress Notes (Signed)
Pharmacy Antibiotic Note  Melanie Best is a 74 y.o. female admitted on 11/03/2019 with pneumonia; found to be COVID+. Pharmacy has been consulted for vancomycin dosing. Patient also receiving Cefepime, Azithromycin, and Remdesivir; Regeneron given yesterday  Plan:  Vancomycin 1000 mg IV now, then 500 mg IV q24 hr  Measure vancomycin level at steady state as indicated  SCr q48 while on vanc  MRSA PCR ordered to rule out MRSA PNA  Cefepime 2g q24 hr per MD; dosing appropriate  Remdesivir and Azithromycin also per MD; dosing appropriate   Height: 5\' 5"  (165.1 cm) Weight: 53.1 kg (117 lb) IBW/kg (Calculated) : 57  Temp (24hrs), Avg:98 F (36.7 C), Min:97.7 F (36.5 C), Max:98.1 F (36.7 C)  Recent Labs  Lab 11/03/19 1624 11/03/19 1627 11/04/19 0445  WBC 12.0*  --  11.6*  CREATININE 2.14*  --  1.69*  LATICACIDVEN  --  1.3  --     Estimated Creatinine Clearance: 25.2 mL/min (A) (by C-G formula based on SCr of 1.69 mg/dL (H)).    No Known Allergies  Antimicrobials this admission: 8/17 vancomycin >>  8/17 cefepime >>  8/17 azithromycin >> 8/17 remdesivir >>   Dose adjustments this admission: n/a  Microbiology results: 8/17 BCx: sent 8/17 Sputum: sent  8/17 MRSA PCR: ordered  Thank you for allowing pharmacy to be a part of this patient's care.  Deiontae Rabel A 11/04/2019 12:43 PM

## 2019-11-04 NOTE — ED Notes (Signed)
Pt stating she "doesn't feel right", CBG check at this time, 66. Pt refusing to take any POs at this time. States she is too nauseated. 12.5G D50 IV administered per protocol.  Provider made aware.

## 2019-11-04 NOTE — Progress Notes (Signed)
Bilateral lower extremity venous duplex has been completed. Preliminary results can be found in CV Proc through chart review.  Results were given to the patient's nurse, Denton Ar.  11/04/19 2:59 PM Melanie Best RVT

## 2019-11-04 NOTE — ED Notes (Signed)
Pallative care at bedside to speak with pt.

## 2019-11-04 NOTE — ED Notes (Signed)
Vascular at bedside

## 2019-11-04 NOTE — ED Notes (Signed)
Floor is unable to take report or pt until 2300 per [facility]

## 2019-11-04 NOTE — ED Notes (Signed)
Pt ate some of her fruit and dinner tray. Pt has no complaints at this time. Pt is very pleasant and grateful for assistance.

## 2019-11-04 NOTE — ED Notes (Signed)
Pt took a few bites of roast in room. Pt requesting grapes.

## 2019-11-04 NOTE — Progress Notes (Addendum)
PROGRESS NOTE    Melanie Best  GQQ:[id number] DOB: 1946-01-09 DOA: 11/03/2019 PCP: [name], MD   Chief Complaint  Patient presents with  . Shortness of Breath  . Fatigue    Brief Narrative:  Melanie Best is Melanie Best 74 y.o. 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  Patient familiar to me from last admission.  She was admitted from August 4 to August 7 after being sent from her hematologist office with finding of new acute blood loss anemia secondary to gross hematuria from her known renal cell carcinoma and anticoagulation with Xarelto.  During admission she was transfused 2 units PRBC with her hemoglobin rising to 9.3 at discharge.  Her Xarelto was held at discharge given her poor prognosis there is no further plan for anticoagulation.  She was also seen by palliative care with plan to go home on palliative versus hospice, and was made DNR at that time.  Today she presents the ED for sending fatigue and lack of appetite.  He states the symptoms are gotten worse since she was discharged from the hospital.  She lives with her husband and her son, her son has been admitted to the hospital due to Covid in the past week.  She reports that she has received her Covid vaccine series back in March, she endorses Melanie Best chronic shortness of breath but states that it is not any different than how she usually feels.  She has been on her usual 2 L of oxygen without needing additional oxygen.  With regards to hospice she is unsure of details, but states that Melanie Best hospital bed was brought out to her home.  Review of chart also shows that she received palliative radiation on August 9.  At discharge she was prescribed morphine and fentanyl patches for air hunger but states she does not take this because she does not like the way it makes her  feel.  Work-up in the ED showed vital signs with slightly soft blood pressures in 90s over 40s that are lower compared to her baseline, stable hemoglobin since discharge at 8.4, and CMP showing increasing creatinine from 1.4 to 2.14 since discharge.  She was also found to be incidentally Covid positive.  She was admitted for management of AKI.  Assessment & Plan:   Active Problems:   COPD, very severe ([place])   HTN (hypertension)   Renal cell carcinoma (HCC)   Chronic hypoxemic respiratory failure (HCC)   Palliative care by specialist   AKI (acute kidney injury) ([place])  # Acute on Chronic Hypoxic Respiratory Failure 2/2 COVID 19 Pneumonia Vaccinated On 2 L at baseline, requiring 6 L per ED RN at this time CT chest with patchy bilateral basilar opacities concerning for multifocal pneumonia with small basilar effusions as well as enlarging L perihilar and lower lobe process concerning for worsening of disease in chest/disease recurrence of uncertain significance in current setting of pneumonia (see report) S/p regeneron 8/17 am Steroids, remdesivir. Elevated procalcitonin, will cover for HCAP with vancomycin/cefepime/azithromycin.  Narrow as able. No actemra or baricitinib at this time with significantly elevated pct Blood cx, sputum cx, urine strep, urine legionella, MRSA PCR I/O, daily weights Prone as able, OOB, IS, flutter.  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  There was Melanie Best discussion of IVC filter placement at the last hospitalization, but the risk of filter was thought to exceed benefit and the recommendation was for stopping anticoagulation and only recommending filter if recurrence of thrombotic disease.  Additional goals of care conversations will be helpful for additional decisions regarding next steps for Melanie Best. Follow LE [place] -> notable for indeterminate DVT involving L femoral vein, L popliteal vein, and left posterior tibial veins - discussed with vascular who suspects this represents old DVT with previous imaging - will hold off on therapeutic anticoagulation at this point give risk of bleeding and suspected chronicity of these clots Consider V/Q scan    Heparin at prophylactic dose for now (BID dosing given weight and risk of bleeding, discussed with pharm), watch for bleeding with recent ABLA from hematuria - if she has acute dvt could consider IVC filter vs trial of therapeutic anticoagulation   #Renal Cell Carcinoma  Tumor Thrombus Per discharge summary from last admission, patient went home on palliative  She was supposed to get palliative radiation (which she received on 8/9 it appears) and then apparently plan for transition to hospice? She's currently under the [facility] - continue to discuss with palliative care/hospice additional goc with COVID pneumonia above Palliative care consult  - attempted to call husband today to discuss as well, unable to reach him CT above with suspected worsening of L renal mass and renal venous invasion  #Chronic Medical problems COPD on 2 L: Continue ipratropium and albuterol inhalers Hypertension: Hold diltiazem due to soft pressures on admission GERD: continue PPI Anemia: Hemoglobin stable at 8.4 since last admission Anxiety/depression: Continue mirtazapine  DVT prophylaxis: heparin  Code Status: DNR Family Communication: unable  to reach husband Disposition:   Status is: Observation  The patient will require care spanning > 2 midnights and should be moved to inpatient because: Inpatient level of care appropriate due to severity of illness  Dispo: The patient is from: Home              Anticipated d/c is to: pending              Anticipated d/c date is: > 3 days              Patient currently is not medically stable to d/c.   Consultants:   Palliative Care  Oncology  Procedures:   none  Antimicrobials:  Anti-infectives (From admission, onward)   Start     Dose/Rate Route Frequency Ordered Stop   11/05/19 1430  vancomycin (VANCOREADY) IVPB 500 mg/100 mL     Discontinue     500 mg 100 mL/hr over 60 Minutes Intravenous Every 24 hours 11/04/19 1241     11/05/19 1000  remdesivir 100 mg in sodium chloride 0.9 % 100 mL IVPB     Discontinue    "Followed by" Linked Group Details   100 mg 200 mL/hr over 30 Minutes Intravenous Daily 11/04/19 1239 11/09/19 0959  11/04/19 1500  remdesivir 200 mg in sodium chloride 0.9% 250 mL IVPB     Discontinue    "Followed by" Linked Group Details   200 mg 580 mL/hr over 30 Minutes Intravenous Once 11/04/19 1239     11/04/19 1330  vancomycin (VANCOCIN) IVPB 1000 mg/200 mL premix     Discontinue     1,000 mg 200 mL/hr over 60 Minutes Intravenous  Once 11/04/19 1239     11/04/19 1300  ceFEPIme (MAXIPIME) 2 g in sodium chloride 0.9 % 100 mL IVPB     Discontinue     2 g 200 mL/hr over 30 Minutes Intravenous Daily 11/04/19 1239     11/04/19 1230  cefTRIAXone (ROCEPHIN) 2 g in sodium chloride 0.9 % 100 mL IVPB  Status:  Discontinued        2 g 200 mL/hr over 30 Minutes Intravenous Every 24 hours 11/04/19 1227 11/04/19 1229   11/04/19 1230  azithromycin (ZITHROMAX) tablet 500 mg     Discontinue     500 mg Oral Daily 11/04/19 1227 11/09/19 0959     Subjective: Feels overall poorly  C/o SOB  Objective: Vitals:   11/04/19 0930 11/04/19 1000 11/04/19 1103 11/04/19 1130    BP: 119/63 132/67 (!) 148/63 (!) 127/55  Pulse: 91 92 98 99  Resp: 16 (!) 21 18 (!) 27  Temp:      TempSrc:      SpO2: 92% 92% 100% 100%  Weight:      Height:        Intake/Output Summary (Last 24 hours) at 11/04/2019 1218 Last data filed at 11/04/2019 0445 Gross per 24 hour  Intake 3108.5 ml  Output --  Net 3108.5 ml   Filed Weights   11/03/19 1617  Weight: 53.1 kg    Examination:  General exam: Appears uncomfortable, moaning occasionally Respiratory system: Clear to auscultation. Respiratory effort normal. Cardiovascular system: RRR Gastrointestinal system: Abdomen is nondistended, soft and nontender.  Central nervous system: Alert and oriented. No focal neurological deficits. Extremities: no LEE Skin: No rashes, lesions or ulcers Psychiatry: Judgement and insight appear normal. Mood & affect appropriate.     Data Reviewed: I have personally reviewed following labs and imaging studies  CBC: Recent Labs  Lab 11/03/19 1624 11/04/19 0445  WBC 12.0* 11.6*  NEUTROABS 10.4*  --   HGB 8.4* 8.2*  HCT 28.7* 28.2*  MCV 101.8* 103.3*  PLT 128* 130*    Basic Metabolic Panel: Recent Labs  Lab 11/03/19 1624 11/04/19 0445  NA 136 140  K 4.9 4.6  CL 102 108  CO2 21* 18*  GLUCOSE 82 71  BUN 39* 36*  CREATININE 2.14* 1.69*  CALCIUM 7.7* 7.1*    GFR: Estimated Creatinine Clearance: 25.2 mL/min (Miranda Frese) (by C-G formula based on SCr of 1.69 mg/dL (H)).  Liver Function Tests: Recent Labs  Lab 11/03/19 1624  AST 75*  ALT 45*  ALKPHOS 134*  BILITOT 1.2  PROT 5.6*  ALBUMIN 2.6*    CBG: Recent Labs  Lab 11/04/19 0843 11/04/19 0927  GLUCAP 66* 94     Recent Results (from the past 240 hour(s))  SARS Coronavirus 2 by RT PCR (hospital order, performed in [facility] hospital lab) Nasopharyngeal Nasopharyngeal Swab     Status: Abnormal   Collection Time: 11/03/19  4:40 PM   Specimen: Nasopharyngeal Swab  Result Value Ref Range Status   SARS Coronavirus 2  POSITIVE (Tyliyah Mcmeekin) NEGATIVE Final    Comment: RESULT CALLED TO, READ  BACK BY AND VERIFIED WITH: C.Rosine Abe [id number] @1859  BY V.WILKINS (NOTE) SARS-CoV-2 target nucleic acids are DETECTED  SARS-CoV-2 RNA is generally detectable in upper respiratory specimens  during the acute phase of infection.  Positive results are indicative  of the presence of the identified virus, but do not rule out bacterial infection or co-infection with other pathogens not detected by the test.  Clinical correlation with patient history and  other diagnostic information is necessary to determine patient infection status.  The expected result is negative.  Fact Sheet for Patients:   StrictlyIdeas.no   Fact Sheet for Healthcare Providers:   BankingDealers.co.za    This test is not yet approved or cleared by the [place] FDA and  has been authorized for detection and/or diagnosis of SARS-CoV-2 by FDA under an Emergency Use Authorization (EUA).  This EUA will remain in effect (meaning th is test can be used) for the duration of  the COVID-19 declaration under Section 564(b)(1) of the Act, 21 U.S.C. section 360-bbb-3(b)(1), unless the authorization is terminated or revoked sooner.  Performed at [facility], [place] [address] [place], [place] [address]          Radiology Studies: CT CHEST WO CONTRAST  Result Date: 11/04/2019 CLINICAL DATA:  Respiratory failure. Malaise. Patient with history of bilateral renal cell carcinoma. EXAM: CT CHEST WITHOUT CONTRAST TECHNIQUE: Multidetector CT imaging of the chest was performed following the standard protocol without IV contrast. COMPARISON:  March 26, 2019 FINDINGS: Cardiovascular: RIGHT-sided [place]-Cath terminates at the caval to atrial junction. Calcified atheromatous plaque of the thoracic aorta without aneurysmal dilation. Normal caliber central pulmonary vasculature. Normal heart size. Small  pericardial effusion unchanged from previous exam in terms of volume. Calcified coronary artery disease. Mediastinum/Nodes: No thoracic inlet adenopathy. No mediastinal lymphadenopathy. No axillary lymphadenopathy. Esophagus grossly normal. Hilar structures with limited assessment due to lack of intravenous contrast. Lungs/Pleura: Background of severe pulmonary emphysema with bandlike scarring in the RIGHT upper lobe extending to the periphery Patchy bilateral basilar opacities, interval development of bronchial wall thickening at the lung bases. LEFT perihilar lesion measuring 3.9 x 3.3 cm with nodularity tracking along the bronchovascular bundle that has increased to Montario Zilka similar extent tracking into the medial LEFT lung base. Area previously measuring 3.3 x 2.3 cm Upper Abdomen: Gallbladder distension with cholelithiasis. No pericholecystic stranding though the gallbladder is incompletely imaged. Incomplete imaging of the kidneys with mass approximately 7.1 x 7.1 cm in the interpolar LEFT kidney invading the expanded LEFT renal vein. This area measured approximately 6.1 x 5.9 cm on the prior study. Renal venous caliber proximally 2.8 cm on the current study previously approximately 1.8 cm. Musculoskeletal: No acute musculoskeletal process to the extent evaluated. No destructive bone finding IMPRESSION: 1. Patchy bilateral basilar opacities, interval development of bronchial wall thickening at the lung bases, concerning for multifocal pneumonia, associated with small basilar effusions. 2. Enlarging LEFT perihilar and lower lobe process suspicious for worsening of disease in the chest/disease recurrence of uncertain significance in the current setting of suspected pneumonia. Close attention on follow-up is suggested. 3. Post treatment changes in the RIGHT upper lobe are similar to the prior exam. 4. Suspected worsening of LEFT renal mass and renal venous invasion. 5. Cholelithiasis with gallbladder distension that is  grossly similar to previous imaging but incompletely imaged. 6. Calcified coronary artery disease. 7. Emphysema and aortic atherosclerosis. Aortic Atherosclerosis (ICD10-I70.0) and Emphysema (ICD10-J43.9). Electronically Signed   By: Zetta Bills M.D.   On: 11/04/2019 11:11  DG Chest Port 1 View  Result Date: 11/03/2019 CLINICAL DATA:  Dyspnea, COPD, kidney cancer EXAM: PORTABLE CHEST 1 VIEW COMPARISON:  08/21/2017 chest radiograph. FINDINGS: Right internal jugular [place]-Cath terminates over cavoatrial junction. Stable cardiomediastinal silhouette with normal heart size. No pneumothorax. No pleural effusion. Emphysema. No pulmonary edema. Stable upper right parahilar parenchymal band. New thin parenchymal band in infrahilar left lung. No acute consolidative airspace disease. IMPRESSION: Emphysema. New mild linear scarring versus atelectasis in the infrahilar left lung. No acute consolidative airspace disease to suggest [place] pneumonia. Electronically Signed   By: Ilona Sorrel M.D.   On: 11/03/2019 17:53        Scheduled Meds: . brinzolamide  1 drop Both Eyes TID  . ipratropium  2 puff Inhalation Q4H  . latanoprost  1 drop Both Eyes QHS  . methylPREDNISolone (SOLU-MEDROL) injection  40 mg Intravenous Q12H  . mirtazapine  7.5 mg Oral Daily  . pantoprazole  80 mg Oral Q1200   Continuous Infusions: . sodium chloride    . dextrose 5% lactated ringers 50 mL/hr at 11/04/19 0952  . famotidine (PEPCID) IV       LOS: 0 days    Time spent: over 30 min    Fayrene Helper, MD [facility]   To contact the attending provider between 7A-7P or the covering provider during after hours 7P-7A, please log into the web site [email or web address] and access using universal [place] password for that web site. If you do not have the password, please call the hospital operator.  11/04/2019, 12:18 PM

## 2019-11-04 NOTE — Progress Notes (Addendum)
[facility] ED, [place] Patient RN note  Mrs. Garramone is our current hospice patient with [facility] with a terminal diagnosis of Renal cell carcinoma. Patients son tested positive for Covid 19 as well as spouse. Due to patients symptoms of increased SOB and pain she decided to call EMS for further treatment and work up of Covid 19, which she also tested positive for. She is being admited for further treatment and management of AKI. Patient currently on 6L of O2 and her baseline is  2L. Mrs Trostel will be admitted and this is a related admission.    Mrs. Cavagnaro has been admitted with Covid 19 and is currently on 6L of O2.RN reports pt is nauseated and unable to eat or drink. CBG was 66 and was given 12.5G of D50 IV. Pt is also being treated for AKI.    Spoke with spouse about patients status and updated on VS. MD updated this liaison as well on status of patient and plans are to admit once bed becomes available.   V/S:  98.2T, 125/70BP, HR 100, RR 27, O2 97 6L [place] I&O:  3,108.5/? Labs:   CO2: 18 (L) BUN: 36 (H) Creatinine: 1.69 (H) Calcium: 7.1 (L) GFR, Est Non African American: 30 (L) GFR, Est African American: 35 (L) WBC: 11.6 (H) RBC: 2.73 (L) Hemoglobin: 8.2 (L) HCT: 28.2 (L) MCV: 103.3 (H) MCHC: 29.1 (L) RDW: 18.0 (H) Platelets: 130 (L)  Diagnostics:   IMPRESSION: 1. Patchy bilateral basilar opacities, interval development of bronchial wall thickening at the lung bases, concerning for multifocal pneumonia, associated with small basilar effusions. 2. Enlarging LEFT perihilar and lower lobe process suspicious for worsening of disease in the chest/disease recurrence of uncertain significance in the current setting of suspected pneumonia. Close attention on follow-up is suggested. 3. Post treatment changes in the RIGHT upper lobe are similar to the prior exam. 4. Suspected worsening of LEFT renal mass and renal venous invasion. 5. Cholelithiasis with gallbladder  distension that is grossly similar to previous imaging but incompletely imaged. 6. Calcified coronary artery disease. 7. Emphysema and aortic atherosclerosis.  Aortic Atherosclerosis (ICD10-I70.0) and Emphysema (ICD10-J43.9).   Electronically Signed   By: Zetta Bills M.D.   On: 11/04/2019 11:11  IVs/PRNs:  Vanc IVPB 500mg , Remdesivir 100mg  IVPB, Cepepime 2g, Solumedrol 40mg , Dextrose 5% 15ml/hr..PRN: Dilaudid 0.5-1mg  IV, Albuterol inhaler, Solumedrol 125mg /66ml  Problem list:  # Acute on Chronic Hypoxic Respiratory Failure 2/2 COVID 19 Pneumonia Vaccinated On 2 L at baseline, requiring 6 L per ED RN at this time CT chest with patchy bilateral basilar opacities concerning for multifocal pneumonia with small basilar effusions as well as enlarging L perihilar and lower lobe process concerning for worsening of disease in chest/disease recurrence of uncertain significance in current setting of pneumonia (see report) S/p regeneron 8/17 am Steroids, remdesivir. Elevated procalcitonin, will cover for HCAP with vancomycin/cefepime/azithromycin.  Narrow as able. No actemra or baricitinib at this time with significantly elevated pct Blood cx, sputum cx, urine strep, urine legionella, MRSA PCR I/O, daily weights Prone as able, OOB, IS, flutter.  COVID-19 Labs  Recent Labs (last 2 labs)      Recent Labs    11/04/19 0956  DDIMER 6.56*  FERRITIN 2,067*  CRP 20.6*      Recent Labs       Lab Results  Component Value Date   SARSCOV2NAA POSITIVE (A) 11/03/2019   [place] NEGATIVE 10/22/2019     #AKI on CKD IIIb Baseline creatinine 1.4 Creatinine  2.14 at presentation Improving to 1.69 today Continue IVF UA with 30 mg/dl protein and 6-10 RBC, follow Consider renal US if not continuing to improving  # Elevated D dimer  Hx Left Common Femoral Vein DVT: pt with known hx of DVT which per documentation of vascular surgery note was dx in 03/2019 and she'd been on  anticoagulation for over 6 months.  There was a discussion of IVC filter placement at the last hospitalization, but the risk of filter was thought to exceed benefit and the recommendation was for stopping anticoagulation and only recommending filter if recurrence of thrombotic disease.  Additional goals of care conversations will be helpful for additional decisions regarding next steps for Mrs. [facility]. Follow LE [place] Consider V/Q scan    Heparin at prophylactic dose for now (BID dosing given weight and risk of bleeding, discussed with pharm), watch for bleeding with recent ABLA from hematuria - if she has acute dvt could consider IVC filter vs trial of therapeutic anticoagulation   #Renal Cell Carcinoma  Tumor Thrombus Per discharge summary from last admission, patient went home on palliative  She was supposed to get palliative radiation (which she received on 8/9 it appears) and then apparently plan for transition to hospice? She's currently under the care [facility] - continue to discuss with palliative care/hospice additional goc with COVID pneumonia above Palliative care consult - attempted to call husband today to discuss as well, unable to reach him CT above with suspected worsening of L renal mass and renal venous invasion  #Chronic Medical problems COPD on 2 L: Continue ipratropium and albuterol inhalers Hypertension: Hold diltiazem due to soft pressures on admission GERD:continue PPI Anemia: Hemoglobin stable at 8.4 since last admission Anxiety/depression: Continue mirtazapine   D/C planning:  Unknown at this time. Plans to keep patient until more stable and follow up with D/C plan at that time. GOC:  clear. DNR IDT updated. Family:  spoke with spouse and updated on pt status. He and his son both are positive with Covid as well. He appreciates call and updates.  Clementeen Hoof, RN, [facility] (In [place]) [phone]

## 2019-11-05 DIAGNOSIS — J9601 Acute respiratory failure with hypoxia: Secondary | ICD-10-CM

## 2019-11-05 DIAGNOSIS — J1282 Pneumonia due to coronavirus disease 2019: Secondary | ICD-10-CM

## 2019-11-05 LAB — PROCALCITONIN: Procalcitonin: 12.31 ng/mL

## 2019-11-05 LAB — CBC WITH DIFFERENTIAL/PLATELET
Abs Immature Granulocytes: 0.06 10*3/uL (ref 0.00–0.07)
Basophils Absolute: 0 10*3/uL (ref 0.0–0.1)
Basophils Relative: 0 %
Eosinophils Absolute: 0 10*3/uL (ref 0.0–0.5)
Eosinophils Relative: 0 %
HCT: 28.2 % — ABNORMAL LOW (ref 36.0–46.0)
Hemoglobin: 8.3 g/dL — ABNORMAL LOW (ref 12.0–15.0)
Immature Granulocytes: 1 %
Lymphocytes Relative: 5 %
Lymphs Abs: 0.6 10*3/uL — ABNORMAL LOW (ref 0.7–4.0)
MCH: 29.6 pg (ref 26.0–34.0)
MCHC: 29.4 g/dL — ABNORMAL LOW (ref 30.0–36.0)
MCV: 100.7 fL — ABNORMAL HIGH (ref 80.0–100.0)
Monocytes Absolute: 0.2 10*3/uL (ref 0.1–1.0)
Monocytes Relative: 2 %
Neutro Abs: 10.6 10*3/uL — ABNORMAL HIGH (ref 1.7–7.7)
Neutrophils Relative %: 92 %
Platelets: 122 10*3/uL — ABNORMAL LOW (ref 150–400)
RBC: 2.8 MIL/uL — ABNORMAL LOW (ref 3.87–5.11)
RDW: 17.8 % — ABNORMAL HIGH (ref 11.5–15.5)
WBC: 11.4 10*3/uL — ABNORMAL HIGH (ref 4.0–10.5)
nRBC: 0 % (ref 0.0–0.2)

## 2019-11-05 LAB — LEGIONELLA PNEUMOPHILA SEROGP 1 UR AG: L. pneumophila Serogp 1 Ur Ag: NEGATIVE

## 2019-11-05 LAB — COMPREHENSIVE METABOLIC PANEL
ALT: 45 U/L — ABNORMAL HIGH (ref 0–44)
AST: 71 U/L — ABNORMAL HIGH (ref 15–41)
Albumin: 2.4 g/dL — ABNORMAL LOW (ref 3.5–5.0)
Alkaline Phosphatase: 337 U/L — ABNORMAL HIGH (ref 38–126)
Anion gap: 12 (ref 5–15)
BUN: 35 mg/dL — ABNORMAL HIGH (ref 8–23)
CO2: 20 mmol/L — ABNORMAL LOW (ref 22–32)
Calcium: 7.5 mg/dL — ABNORMAL LOW (ref 8.9–10.3)
Chloride: 106 mmol/L (ref 98–111)
Creatinine, Ser: 1.45 mg/dL — ABNORMAL HIGH (ref 0.44–1.00)
GFR calc Af Amer: 42 mL/min — ABNORMAL LOW (ref >60)
GFR calc non Af Amer: 36 mL/min — ABNORMAL LOW (ref >60)
Glucose, Bld: 168 mg/dL — ABNORMAL HIGH (ref 70–99)
Potassium: 4.5 mmol/L (ref 3.5–5.1)
Sodium: 138 mmol/L (ref 135–145)
Total Bilirubin: 0.6 mg/dL (ref 0.3–1.2)
Total Protein: 5.4 g/dL — ABNORMAL LOW (ref 6.5–8.1)

## 2019-11-05 LAB — C-REACTIVE PROTEIN: CRP: 23.2 mg/dL — ABNORMAL HIGH (ref <1.0)

## 2019-11-05 LAB — D-DIMER, QUANTITATIVE: D-Dimer, Quant: 3.8 ug/mL-FEU — ABNORMAL HIGH (ref 0.00–0.50)

## 2019-11-05 LAB — PHOSPHORUS: Phosphorus: 4.1 mg/dL (ref 2.5–4.6)

## 2019-11-05 LAB — MRSA PCR SCREENING: MRSA by PCR: POSITIVE — AB

## 2019-11-05 LAB — MAGNESIUM: Magnesium: 1.9 mg/dL (ref 1.7–2.4)

## 2019-11-05 LAB — FERRITIN: Ferritin: 2052 ng/mL — ABNORMAL HIGH (ref 11–307)

## 2019-11-05 MED ORDER — SODIUM CHLORIDE 0.9% FLUSH: 10.0000 mL | INTRAVENOUS | Status: DC | PRN

## 2019-11-05 MED ORDER — ADULT MULTIVITAMIN W/MINERALS CH
1.0000 | ORAL_TABLET | Freq: Every day | ORAL | Status: DC
  Administered 2019-11-05 – 2019-11-11 (×7): 1 via ORAL
  Filled 2019-11-05 (×7): qty 1

## 2019-11-05 MED ORDER — VANCOMYCIN HCL 750 MG/150ML IV SOLN
750.0000 mg | INTRAVENOUS | Status: AC
  Administered 2019-11-05 – 2019-11-11 (×7): 750 mg via INTRAVENOUS
  Filled 2019-11-05 (×7): qty 150

## 2019-11-05 MED ORDER — BOOST PLUS PO LIQD
237.0000 mL | Freq: Three times a day (TID) | ORAL | Status: DC
  Administered 2019-11-05 – 2019-11-10 (×11): 237 mL via ORAL
  Filled 2019-11-05 (×19): qty 237

## 2019-11-05 MED ORDER — CHLORHEXIDINE GLUCONATE CLOTH 2 % EX PADS
6.0000 | MEDICATED_PAD | Freq: Every day | CUTANEOUS | Status: DC
  Administered 2019-11-05 – 2019-11-10 (×6): 6 via TOPICAL

## 2019-11-05 MED ORDER — DILTIAZEM HCL ER COATED BEADS 120 MG PO CP24
120.0000 mg | ORAL_CAPSULE | Freq: Every day | ORAL | Status: DC
  Administered 2019-11-05 – 2019-11-11 (×7): 120 mg via ORAL
  Filled 2019-11-05 (×7): qty 1

## 2019-11-05 MED ORDER — SODIUM CHLORIDE 0.9% FLUSH
10.0000 mL | Freq: Two times a day (BID) | INTRAVENOUS | Status: DC
  Administered 2019-11-05 – 2019-11-11 (×7): 10 mL

## 2019-11-05 NOTE — Plan of Care (Signed)

## 2019-11-05 NOTE — Progress Notes (Signed)
WL [id number] - Manufacturing engineer [facility]) [facility] Liaison Note    Melanie Best is a current hospice patient with [facility] with a terminal diagnosis of Renal Cell Carcinoma. Patient initiated call to EMS from the home for evaluation of increased SOB/pain/fatigue.  Patient was transported to [facility] then admitted on 11/04/19 for management of AKI and this is a related admission. Patient is a DNR.   Checked in with bedside RN via telephone for report, patient had a restless night as she arrived on the unit early this morning from the ED so is very drowsy this morning. Patient is currently on 3.5 L O2 with 100% sats. RN states that the plan is for patient to continue on IV antibiotics with a possible discharge in 48hours if medically stable. Unsuccessful in reaching patient via telephone in hospital room - in person visitors limited at hospital due to [place] precautions. Spoke with patient spouse to support who stated that wife was feeling very tired/SOB this morning during their phone conversation adding that she just needed rest.     V/S:  97.8, 102, 22, 144/78, sats 100% on 3.5L O2 I&O: 3804.9/700 (+3104.9) Abnormal lab work: (11/05/19) WBC 11.4, RBC 2.80, Hemo 8.3, HCT 28.2, Plate 122, CO2 20, BUN 35, Create 1.45, Cal 7.5, Glucose 168, SARS Covid 19 positive Diagnostics: CXR on 11/03/19 - Suggests Pneumonia.   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[place])   HTN (hypertension)   Renal cell carcinoma (HCC)   Chronic hypoxemic respiratory failure ([place])    Palliative care by specialist   AKI (acute kidney injury) ([place])   # Acute on Chronic Hypoxic Respiratory Failure 2/2 COVID 19 Pneumonia Vaccinated On 2 L at baseline, requiring 6 L per ED RN at this time CT chest with patchy bilateral basilar opacities concerning for multifocal pneumonia with small basilar effusions as well as enlarging L perihilar and lower lobe process concerning for worsening of disease in chest/disease recurrence of uncertain significance in current setting of pneumonia (see report) S/p regeneron 8/17 am Steroids, remdesivir. Elevated procalcitonin, will cover for HCAP with vancomycin/cefepime/azithromycin.  Narrow as able. No actemra or baricitinib at this time with significantly elevated pct Blood cx, sputum cx, urine strep, urine legionella, MRSA PCR I/O, daily weights Prone as able, OOB, IS, flutter.     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  There was a discussion of IVC filter placement at the last hospitalization, but the risk of filter was thought to exceed benefit and the recommendation was for stopping anticoagulation and only recommending filter if recurrence of thrombotic disease.  Additional goals of care conversations will  be helpful for additional decisions regarding next steps for Mrs. Godlewski. Follow LE [place] -> notable for indeterminate DVT involving L femoral vein, L popliteal vein, and left posterior tibial veins - discussed with vascular who suspects this represents old DVT with previous imaging - will hold off on therapeutic anticoagulation at this point give risk of bleeding and suspected  chronicity of these clots Consider V/Q scan    Heparin at prophylactic dose for now (BID dosing given weight and risk of bleeding, discussed with pharm), watch for bleeding with recent ABLA from hematuria - if she has acute dvt could consider IVC filter vs trial of therapeutic anticoagulation   #Renal Cell Carcinoma  Tumor Thrombus Per discharge summary from last admission, patient went home on palliative  She was supposed to get palliative radiation (which she received on 8/9 it appears) and then apparently plan for transition to hospice? She's currently under the care of hospice[facility] - continue to discuss with palliative care/hospice additional goc with COVID pneumonia above Palliative care consult  - attempted to call husband today to discuss as well, unable to reach him CT above with suspected worsening of L renal mass and renal venous invasion   #Chronic Medical problems COPD on 2 L: Continue ipratropium and albuterol inhalers Hypertension: Hold diltiazem due to soft pressures on admission GERD: continue PPI Anemia: Hemoglobin stable at 8.4 since last admission Anxiety/depression: Continue mirtazapine  D/C planning: Patient is not medically stable to discharge - D/C date unknown at this time. Once disposition is determined - Please use [facility] for transportation needs of [facility] patients. Goals of Care: Clear: DNR - patient family confirmed continued wishes for patient to discharge home with hospice upon discharge. Communication with IDT: Updated [place] team with patient condition/status. Transfer summary completed on 11/04/19. Communication with [facility]: Supported Patient spouse via telephone - has [place] contact information/enc to call as needed. Informed that an [place] [facility] will follow patient daily during this hospitalization.   Please call with any hospice related questions/concerns,   Gar Ponto, RN [place] (in [place]) [phone]

## 2019-11-05 NOTE — Progress Notes (Signed)
Initial Nutrition Assessment  INTERVENTION:   -Boost Plus chocolate TID- Each supplement provides 360kcal and 14g protein.   -Multivitamin with minerals daily  NUTRITION DIAGNOSIS:   Increased nutrient needs related to cancer and cancer related treatments (COVID-19 infection) as evidenced by estimated needs.  GOAL:   Patient will meet greater than or equal to 90% of their needs  MONITOR:   PO intake, Supplement acceptance, Labs, Weight trends, I & O's, Skin  REASON FOR ASSESSMENT:   Malnutrition Screening Tool    ASSESSMENT:   74 y.o. female Melanie Best s a 74 y.o. 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  8/16: COVID+  Patient from home with hospice d/t metastatic renal cell carcinoma. Pt was seen by nutrition staff during previous admission earlier this month and is followed by [facility].  Pt not eating well, was not hungry for breakfast this morning. Pt has had continued poor appetite and PO over the past year.  Pt has drank Boost Plus supplements at home, will order.  Admission weight: 117 lbs. Current weight: 129 lbs  Labs reviewed. Medications: Remeron, Senokot  NUTRITION - FOCUSED PHYSICAL EXAM:  Unable to complete  Diet Order:   Diet Order            Diet regular Room service appropriate? Yes; Fluid consistency: Thin  Diet effective now                 EDUCATION NEEDS:   Not appropriate for education at this time  Skin:  Skin Assessment: Reviewed RN Assessment  Last BM:  PTA  Height:   Ht Readings from Last 1 Encounters:  11/05/19 5\' 6"  (1.676 m)    Weight:   Wt Readings from Last 1 Encounters:  11/05/19 58.9 kg    BMI:  Body mass index is 20.96 kg/m.  Estimated Nutritional Needs:   Kcal:  1850-2050  Protein:  85-100g  Fluid:  2L/day  Clayton Bibles, MS, RD, LDN Inpatient Clinical  Dietitian Contact information available via Amion

## 2019-11-05 NOTE — Progress Notes (Signed)
PROGRESS NOTE    Melanie Best  WNU:[id number] DOB: 1945/12/09 DOA: 11/03/2019 PCP: [name], MD   Chief Complaint  Patient presents with  . Shortness of Breath  . Fatigue    Brief Narrative:  Melanie Best is a 74 y.o. 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  She was admitted from August 4 to August 7 after being sent from her hematologist office with finding of new acute blood loss anemia secondary to gross hematuria from her known renal cell carcinoma and anticoagulation with Xarelto.  During admission she was transfused 2 units PRBC with her hemoglobin rising to 9.3 at discharge.  Her Xarelto was held at discharge given her poor prognosis there is no further plan for anticoagulation.  She was also seen by palliative care with plan to go home on palliative versus hospice, and was made DNR at that time.  She presented to the ED for fatigue and lack of appetite.  She lives with her husband and her son, her son has been admitted to the hospital due to Covid in the past week.  She reports that she has received her Covid vaccine series back in March, she endorses a chronic shortness of breath but states that it is not any different than how she usually feels.  She has been on her usual 2 L of oxygen without needing additional oxygen.  With regards to hospice she is unsure of details, but states that a hospital bed was brought out to her home.  Review of chart also shows that she received palliative radiation on August 9.  At discharge she was prescribed morphine and fentanyl patches for air hunger but states she does not take this because she does not like the way it makes her feel.  Work-up in the ED showed vital signs with slightly soft blood pressures in 90s over 40s that are lower compared to her baseline,  stable hemoglobin since discharge at 8.4, and CMP showing increasing creatinine from 1.4 to 2.14 since discharge.  She was also found to be incidentally Covid positive.  She was admitted for management of AKI.  Assessment & Plan:   Acute on Chronic Hypoxic Respiratory Failure 2/2 COVID 19 Pneumonia/concern for healthcare associated pneumonia This is a breakthrough infection.  Patient previously vaccinated.  At baseline she is on 2 L of oxygen.  She was requiring 6 L at admission.  Continue to wean down.  Noted to be saturating 100% this morning.  Discussed with nursing staff.   CT chest with patchy bilateral basilar opacities concerning for multifocal pneumonia with small basilar effusions as well as enlarging L perihilar and lower lobe process concerning for worsening of disease in chest/disease recurrence of uncertain significance in current setting of pneumonia (see report) She was given RegenCov on 8/17. She was subsequently started on remdesivir and steroids due to respiratory manifestations.  She was also noted to have elevated procalcitonin and was started on vancomycin cefepime and azithromycin.  Cultures negative so far.  Procalcitonin however noted to have climbed from 6.86 to 12.31 today.  No Actemra or bevacizumab due to significant elevated procalcitonin level. Incentive spirometry, prone positioning any, mobilization as much as possible. Inflammatory markers noted to be significantly elevated with a CRP of 23.2 and a ferritin of 2052.  D-dimer was 6.56 yesterday and noted to be 3.80 today.  COVID-19  Labs  Recent Labs    11/04/19 0956 11/05/19 0555  DDIMER 6.56* 3.80*  FERRITIN 2,067* 2,052*  CRP 20.6* 23.2*    AKI on CKD IIIb Baseline creatinine 1.4 Creatinine 2.14 at presentation Renal function seems to be back to baseline.  Creatinine is 1.45 today.  Monitor urine output.  Elevated D dimer  Hx Left Common Femoral Vein DVT Patient with known hx of DVT which per  documentation of vascular surgery note was dx in 03/2019 and she'd been on anticoagulation for over 6 months.  There was a discussion of IVC filter placement at the last hospitalization, but the risk of filter was thought to exceed benefit and the recommendation was for stopping anticoagulation and only recommending filter if recurrence of thrombotic disease. Lower extremity Doppler study done during this hospitalization shows indeterminate age DVT in the left leg.  This is thought to be an old DVT based on discussion with vascular surgery.  No anticoagulation at this time due to risk of bleeding.  Currently on prophylactic doses of subcutaneous heparin.  Renal Cell Carcinoma  Tumor Thrombus Per discharge summary from last admission, patient went home on palliative care/hospice. Received palliative radiation on 8/9. She's currently under the care of hospice[facility]. Palliative care has been consulted to assist with goals of care. Plan is to continue current treatment without any escalation.  History of COPD on home oxygen at 2 L/min Stable.  Continue inhalers.  Essential hypertension Diltiazem was held at admission due to borderline low blood pressures.  May resume at lower dose since blood pressure occasionally in the hypertensive range.    GERD Continue PPI  Macrocytic Anemia Hemoglobin stable at 8.4 since last admission.  No evidence of overt bleeding.  Anxiety/depression Continue mirtazapine  DVT prophylaxis: Subcutaneous Code Status: DNR Family Communication: We will try to reach family later today. Disposition: Unclear disposition at this time.  Status is: Inpatient  Remains inpatient appropriate because:IV treatments appropriate due to intensity of illness or inability to take PO and Inpatient level of care appropriate due to severity of illness   Dispo: The patient is from: Home              Anticipated d/c is to: To be determined              Anticipated d/c date is:  3 days              Patient currently is not medically stable to d/c.      Consultants:   Palliative Care  Oncology  Procedures:   none  Antimicrobials:  Anti-infectives (From admission, onward)   Start     Dose/Rate Route Frequency Ordered Stop   11/05/19 1430  vancomycin (VANCOREADY) IVPB 500 mg/100 mL  Status:  Discontinued        500 mg 100 mL/hr over 60 Minutes Intravenous Every 24 hours 11/04/19 1241 11/05/19 1203   11/05/19 1430  vancomycin (VANCOREADY) IVPB 750 mg/150 mL     Discontinue     750 mg 150 mL/hr over 60 Minutes Intravenous Every 24 hours 11/05/19 1203     11/05/19 1000  remdesivir 100 mg in sodium chloride 0.9 % 100 mL IVPB     Discontinue    "Followed by" Linked Group Details   100 mg 200 mL/hr over 30 Minutes Intravenous Daily 11/04/19 1239 11/09/19 0959   11/04/19 1500  remdesivir 200 mg in sodium chloride 0.9% 250 mL IVPB       "Followed by" Linked  Group Details   200 mg 580 mL/hr over 30 Minutes Intravenous Once 11/04/19 1239 11/04/19 1449   11/04/19 1330  vancomycin (VANCOCIN) IVPB 1000 mg/200 mL premix        1,000 mg 200 mL/hr over 60 Minutes Intravenous  Once 11/04/19 1239 11/04/19 1428   11/04/19 1300  ceFEPIme (MAXIPIME) 2 g in sodium chloride 0.9 % 100 mL IVPB     Discontinue     2 g 200 mL/hr over 30 Minutes Intravenous Daily 11/04/19 1239     11/04/19 1230  cefTRIAXone (ROCEPHIN) 2 g in sodium chloride 0.9 % 100 mL IVPB  Status:  Discontinued        2 g 200 mL/hr over 30 Minutes Intravenous Every 24 hours 11/04/19 1227 11/04/19 1229   11/04/19 1230  azithromycin (ZITHROMAX) tablet 500 mg     Discontinue     500 mg Oral Daily 11/04/19 1227 11/09/19 0959     Subjective: Patient states that she is feeling slightly better today.  Less short of breath compared to yesterday.  Denies chest pain.  No nausea or vomiting.  Objective: Vitals:   11/05/19 0428 11/05/19 0817 11/05/19 1024 11/05/19 1210  BP:  (!) 153/75 (!) 144/78 127/68    Pulse:  (!) 111 (!) 102 92  Resp:  20 (!) 22 20  Temp:  97.8 F (36.6 C) 97.8 F (36.6 C) (!) 97.5 F (36.4 C)  TempSrc:  Oral Oral Oral  SpO2:  100% 100% 100%  Weight: 58.9 kg     Height:        Intake/Output Summary (Last 24 hours) at 11/05/2019 1358 Last data filed at 11/05/2019 1100 Gross per 24 hour  Intake 596.38 ml  Output 1200 ml  Net -603.62 ml   Filed Weights   11/03/19 1617 11/05/19 0025 11/05/19 0428  Weight: 53.1 kg 58.8 kg 58.9 kg    Examination:  General appearance: Awake alert.  In no distress.  Slightly distracted Resp: Slightly tachypneic at rest.  Coarse breath sounds with crackles bilateral bases.  No wheezing or rhonchi. Cardio: S1-S2 is normal regular.  No S3-S4.  No rubs murmurs or bruit GI: Abdomen is soft.  Nontender nondistended.  Bowel sounds are present normal.  No masses organomegaly Extremities: No edema.  Full range of motion of lower extremities. Neurologic:  No focal neurological deficits.      Data Reviewed: I have personally reviewed following labs and imaging studies  CBC: Recent Labs  Lab 11/03/19 1624 11/04/19 0445 11/05/19 0555  WBC 12.0* 11.6* 11.4*  NEUTROABS 10.4*  --  10.6*  HGB 8.4* 8.2* 8.3*  HCT 28.7* 28.2* 28.2*  MCV 101.8* 103.3* 100.7*  PLT 128* 130* 122*    Basic Metabolic Panel: Recent Labs  Lab 11/03/19 1624 11/04/19 0445 11/05/19 0555  NA 136 140 138  K 4.9 4.6 4.5  CL 102 108 106  CO2 21* 18* 20*  GLUCOSE 82 71 168*  BUN 39* 36* 35*  CREATININE 2.14* 1.69* 1.45*  CALCIUM 7.7* 7.1* 7.5*  MG  --   --  1.9  PHOS  --   --  4.1    GFR: Estimated Creatinine Clearance: 32.6 mL/min (A) (by C-G formula based on SCr of 1.45 mg/dL (H)).  Liver Function Tests: Recent Labs  Lab 11/03/19 1624 11/05/19 0555  AST 75* 71*  ALT 45* 45*  ALKPHOS 134* 337*  BILITOT 1.2 0.6  PROT 5.6* 5.4*  ALBUMIN 2.6* 2.4*    CBG: Recent Labs  Lab 11/04/19 0843 11/04/19 0927 11/04/19 1305  GLUCAP 66* 94 103*      Recent Results (from the past 240 hour(s))  SARS Coronavirus 2 by RT PCR (hospital order, performed in [facility] hospital lab) Nasopharyngeal Nasopharyngeal Swab     Status: Abnormal   Collection Time: 11/03/19  4:40 PM   Specimen: Nasopharyngeal Swab  Result Value Ref Range Status   SARS Coronavirus 2 POSITIVE (A) NEGATIVE Final    Comment: RESULT CALLED TO, READ BACK BY AND VERIFIED WITH: C.Rosine Abe [id number] @[id number]  BY V.WILKINS (NOTE) SARS-CoV-2 target nucleic acids are DETECTED  SARS-CoV-2 RNA is generally detectable in upper respiratory specimens  during the acute phase of infection.  Positive results are indicative  of the presence of the identified virus, but do not rule out bacterial infection or co-infection with other pathogens not detected by the test.  Clinical correlation with patient history and  other diagnostic information is necessary to determine patient infection status.  The expected result is negative.  Fact Sheet for Patients:   StrictlyIdeas.no   Fact Sheet for Healthcare Providers:   BankingDealers.co.za    This test is not yet approved or cleared by the [place] FDA and  has been authorized for detection and/or diagnosis of SARS-CoV-2 by FDA under an Emergency Use Authorization (EUA).  This EUA will remain in effect (meaning th is test can be used) for the duration of  the COVID-19 declaration under Section 564(b)(1) of the Act, 21 U.S.C. section 360-bbb-3(b)(1), unless the authorization is terminated or revoked sooner.  Performed at [facility], [place] [address] [place], [place] [address]   Culture, blood (routine x 2) Call MD if unable to obtain prior to antibiotics being given     Status: None (Preliminary result)   Collection Time: 11/04/19  1:10 PM   Specimen: Left Antecubital; Blood  Result Value Ref Range Status   Specimen Description   Final    LEFT  ANTECUBITAL Performed at [facility] [place] [address], [place], [place] [address]    Special Requests   Final    BOTTLES DRAWN AEROBIC AND ANAEROBIC Blood Culture results may not be optimal due to an excessive volume of blood received in culture bottles Performed at [facility] [address], [place], [place] [address]    Culture   Final    NO GROWTH < 24 HOURS Performed at [facility] [address], [place], [place] [address]    Report Status PENDING  Incomplete  Culture, blood (routine x 2) Call MD if unable to obtain prior to antibiotics being given     Status: None (Preliminary result)   Collection Time: 11/04/19  1:20 PM   Specimen: Right Antecubital; Blood  Result Value Ref Range Status   Specimen Description   Final    RIGHT ANTECUBITAL Performed at [place] [address], [place], [place] [address]    Special Requests   Final    BOTTLES DRAWN AEROBIC AND ANAEROBIC Blood Culture adequate volume Performed at [facility] [address] [place], [place] [address]    Culture   Final    NO GROWTH < 24 HOURS Performed at [facility] [address] [place], [place] [address]    Report Status PENDING  Incomplete         Radiology Studies: CT CHEST WO CONTRAST  Result Date: 11/04/2019 CLINICAL DATA:  Respiratory failure. Malaise. Patient with history  of bilateral renal cell carcinoma. EXAM: CT CHEST WITHOUT CONTRAST TECHNIQUE: Multidetector CT imaging of the chest was performed following the standard protocol without IV contrast. COMPARISON:  March 26, 2019 FINDINGS: Cardiovascular: RIGHT-sided Port-A-Cath terminates at the caval to atrial junction. Calcified atheromatous plaque of the thoracic aorta without aneurysmal dilation. Normal caliber central pulmonary vasculature. Normal heart size. Small pericardial effusion unchanged from previous exam in terms of volume.  Calcified coronary artery disease. Mediastinum/Nodes: No thoracic inlet adenopathy. No mediastinal lymphadenopathy. No axillary lymphadenopathy. Esophagus grossly normal. Hilar structures with limited assessment due to lack of intravenous contrast. Lungs/Pleura: Background of severe pulmonary emphysema with bandlike scarring in the RIGHT upper lobe extending to the periphery Patchy bilateral basilar opacities, interval development of bronchial wall thickening at the lung bases. LEFT perihilar lesion measuring 3.9 x 3.3 cm with nodularity tracking along the bronchovascular bundle that has increased to a similar extent tracking into the medial LEFT lung base. Area previously measuring 3.3 x 2.3 cm Upper Abdomen: Gallbladder distension with cholelithiasis. No pericholecystic stranding though the gallbladder is incompletely imaged. Incomplete imaging of the kidneys with mass approximately 7.1 x 7.1 cm in the interpolar LEFT kidney invading the expanded LEFT renal vein. This area measured approximately 6.1 x 5.9 cm on the prior study. Renal venous caliber proximally 2.8 cm on the current study previously approximately 1.8 cm. Musculoskeletal: No acute musculoskeletal process to the extent evaluated. No destructive bone finding IMPRESSION: 1. Patchy bilateral basilar opacities, interval development of bronchial wall thickening at the lung bases, concerning for multifocal pneumonia, associated with small basilar effusions. 2. Enlarging LEFT perihilar and lower lobe process suspicious for worsening of disease in the chest/disease recurrence of uncertain significance in the current setting of suspected pneumonia. Close attention on follow-up is suggested. 3. Post treatment changes in the RIGHT upper lobe are similar to the prior exam. 4. Suspected worsening of LEFT renal mass and renal venous invasion. 5. Cholelithiasis with gallbladder distension that is grossly similar to previous imaging but incompletely imaged. 6.  Calcified coronary artery disease. 7. Emphysema and aortic atherosclerosis. Aortic Atherosclerosis (ICD10-I70.0) and Emphysema (ICD10-J43.9). Electronically Signed   By: Zetta Bills M.D.   On: 11/04/2019 11:11   DG Chest Port 1 View  Result Date: 11/03/2019 CLINICAL DATA:  Dyspnea, COPD, kidney cancer EXAM: PORTABLE CHEST 1 VIEW COMPARISON:  08/21/2017 chest radiograph. FINDINGS: Right internal jugular Port-A-Cath terminates over cavoatrial junction. Stable cardiomediastinal silhouette with normal heart size. No pneumothorax. No pleural effusion. Emphysema. No pulmonary edema. Stable upper right parahilar parenchymal band. New thin parenchymal band in infrahilar left lung. No acute consolidative airspace disease. IMPRESSION: Emphysema. New mild linear scarring versus atelectasis in the infrahilar left lung. No acute consolidative airspace disease to suggest a pneumonia. Electronically Signed   By: Ilona Sorrel M.D.   On: 11/03/2019 17:53   VAS [place] LOWER EXTREMITY VENOUS (DVT)  Result Date: 11/04/2019  Lower Venous DVTStudy Indications: Elevated Ddimer.  Risk Factors: COVID 19 positive Cancer DVT. Comparison Study: No prior studies. Performing Technologist: Oliver Hum RVT  Examination Guidelines: A complete evaluation includes B-mode imaging, spectral Doppler, color Doppler, and power Doppler as needed of all accessible portions of each vessel. Bilateral testing is considered an integral part of a complete examination. Limited examinations for reoccurring indications may be performed as noted. The reflux portion of the exam is performed with the patient in reverse Trendelenburg.  +---------+---------------+---------+-----------+----------+--------------+ RIGHT    CompressibilityPhasicitySpontaneityPropertiesThrombus Aging +---------+---------------+---------+-----------+----------+--------------+ CFV      Full  Yes      Yes                                  +---------+---------------+---------+-----------+----------+--------------+ SFJ      Full                                                        +---------+---------------+---------+-----------+----------+--------------+ FV Prox  Full                                                        +---------+---------------+---------+-----------+----------+--------------+ FV Mid   Full                                                        +---------+---------------+---------+-----------+----------+--------------+ FV DistalFull                                                        +---------+---------------+---------+-----------+----------+--------------+ PFV      Full                                                        +---------+---------------+---------+-----------+----------+--------------+ POP      Full           Yes      Yes                                 +---------+---------------+---------+-----------+----------+--------------+ PTV      Full                                                        +---------+---------------+---------+-----------+----------+--------------+ PERO     Full                                                        +---------+---------------+---------+-----------+----------+--------------+   +---------+---------------+---------+-----------+----------+-----------------+ LEFT     CompressibilityPhasicitySpontaneityPropertiesThrombus Aging    +---------+---------------+---------+-----------+----------+-----------------+ CFV      Full           Yes      Yes                                    +---------+---------------+---------+-----------+----------+-----------------+  SFJ      Full                                                           +---------+---------------+---------+-----------+----------+-----------------+ FV Prox  Partial        Yes      Yes                  Age Indeterminate  +---------+---------------+---------+-----------+----------+-----------------+ FV Mid   Full                                                           +---------+---------------+---------+-----------+----------+-----------------+ FV DistalFull                                                           +---------+---------------+---------+-----------+----------+-----------------+ PFV      Full                                                           +---------+---------------+---------+-----------+----------+-----------------+ POP      Partial        Yes      Yes                  Age Indeterminate +---------+---------------+---------+-----------+----------+-----------------+ PTV      None                                         Age Indeterminate +---------+---------------+---------+-----------+----------+-----------------+ PERO     Full                                                           +---------+---------------+---------+-----------+----------+-----------------+     Summary: RIGHT: - There is no evidence of deep vein thrombosis in the lower extremity.  - No cystic structure found in the popliteal fossa.  LEFT: - Findings consistent with age indeterminate deep vein thrombosis involving the left femoral vein, left popliteal vein, and left posterior tibial veins. - No cystic structure found in the popliteal fossa.  *See table(s) above for measurements and observations. Electronically signed by Deitra Mayo MD on 11/04/2019 at 5:09:43 PM.    Final      Scheduled Meds: . azithromycin  500 mg Oral Daily  . brinzolamide  1 drop Both Eyes TID  . Chlorhexidine Gluconate Cloth  6 each Topical Daily  . heparin injection (subcutaneous)  5,000 Units Subcutaneous Q12H  . ipratropium  2 puff Inhalation Q4H  . latanoprost  1 drop Both Eyes QHS  . methylPREDNISolone (  SOLU-MEDROL) injection  40 mg Intravenous Q12H  . mirtazapine  7.5 mg Oral Daily  . pantoprazole   80 mg Oral Q1200  . senna  1 tablet Oral QHS  . sodium chloride flush  10-40 mL Intracatheter Q12H   Continuous Infusions: . sodium chloride    . ceFEPime (MAXIPIME) IV Stopped (11/04/19 1330)  . famotidine (PEPCID) IV    . remdesivir 100 mg in NS 100 mL 100 mg (11/05/19 1003)  . vancomycin       LOS: 1 day    Bonnielee Haff, MD [facility]   To contact the attending provider between 7A-7P or the covering provider during after hours 7P-7A, please log into the web site [email or web address] and access using universal [place] password for that web site. If you do not have the password, please call the hospital operator.  11/05/2019, 1:58 PM

## 2019-11-05 NOTE — Progress Notes (Addendum)
Pharmacy Antibiotic Note  Melanie Best is a 74 y.o. female admitted on 11/03/2019 with pneumonia; found to be COVID+. Pharmacy has been consulted for vancomycin dosing. Patient also receiving Cefepime, Azithromycin, and Remdesivir; Regeneron given yesterday  Today, 11/05/2019:  Afebrile  WBC improved slightly  PCT rising but should be interpreted carefully in setting of malignancy  SCr improved  Plan:  Increase vancomycin to 750 mg IV q24 hr with improved SCr  Measure vancomycin level at steady state as indicated  SCr q48 while on vanc  MRSA PCR ordered; f/u  Continue cefepime, azithromycin, remdesivir per MD; dosing appropriate   Height: 5\' 6"  (167.6 cm) Weight: 58.9 kg (129 lb 13.6 oz) IBW/kg (Calculated) : 59.3  Temp (24hrs), Avg:98.1 F (36.7 C), Min:97.8 F (36.6 C), Max:98.4 F (36.9 C)  Recent Labs  Lab 11/03/19 1624 11/03/19 1627 11/04/19 0445 11/05/19 0555  WBC 12.0*  --  11.6* 11.4*  CREATININE 2.14*  --  1.69* 1.45*  LATICACIDVEN  --  1.3  --   --     Estimated Creatinine Clearance: 32.6 mL/min (A) (by C-G formula based on SCr of 1.45 mg/dL (H)).    No Known Allergies  Antimicrobials this admission: 8/17 vancomycin >>  8/17 cefepime >>  8/17 azithromycin >> 8/17 remdesivir >>   Dose adjustments this admission: n/a  Microbiology results: 8/17 BCx: ngtd 8/17 Sputum: sent  8/17 MRSA PCR: ordered  Thank you for allowing pharmacy to be a part of this patient's care.  Theodoro Koval A 11/05/2019 11:57 AM

## 2019-11-06 ENCOUNTER — Encounter (HOSPITAL_COMMUNITY): Payer: Self-pay | Admitting: Family Medicine

## 2019-11-06 LAB — COMPREHENSIVE METABOLIC PANEL
ALT: 43 U/L (ref 0–44)
AST: 51 U/L — ABNORMAL HIGH (ref 15–41)
Albumin: 2.4 g/dL — ABNORMAL LOW (ref 3.5–5.0)
Alkaline Phosphatase: 264 U/L — ABNORMAL HIGH (ref 38–126)
Anion gap: 10 (ref 5–15)
BUN: 42 mg/dL — ABNORMAL HIGH (ref 8–23)
CO2: 22 mmol/L (ref 22–32)
Calcium: 7.7 mg/dL — ABNORMAL LOW (ref 8.9–10.3)
Chloride: 106 mmol/L (ref 98–111)
Creatinine, Ser: 1.44 mg/dL — ABNORMAL HIGH (ref 0.44–1.00)
GFR calc Af Amer: 42 mL/min — ABNORMAL LOW (ref >60)
GFR calc non Af Amer: 36 mL/min — ABNORMAL LOW (ref >60)
Glucose, Bld: 136 mg/dL — ABNORMAL HIGH (ref 70–99)
Potassium: 4.4 mmol/L (ref 3.5–5.1)
Sodium: 138 mmol/L (ref 135–145)
Total Bilirubin: 0.6 mg/dL (ref 0.3–1.2)
Total Protein: 5.3 g/dL — ABNORMAL LOW (ref 6.5–8.1)

## 2019-11-06 LAB — CBC
HCT: 27.4 % — ABNORMAL LOW (ref 36.0–46.0)
Hemoglobin: 8 g/dL — ABNORMAL LOW (ref 12.0–15.0)
MCH: 29.1 pg (ref 26.0–34.0)
MCHC: 29.2 g/dL — ABNORMAL LOW (ref 30.0–36.0)
MCV: 99.6 fL (ref 80.0–100.0)
Platelets: 120 10*3/uL — ABNORMAL LOW (ref 150–400)
RBC: 2.75 MIL/uL — ABNORMAL LOW (ref 3.87–5.11)
RDW: 17.8 % — ABNORMAL HIGH (ref 11.5–15.5)
WBC: 10 10*3/uL (ref 4.0–10.5)
nRBC: 0.6 % — ABNORMAL HIGH (ref 0.0–0.2)

## 2019-11-06 LAB — PROCALCITONIN: Procalcitonin: 8.3 ng/mL

## 2019-11-06 MED ORDER — IPRATROPIUM-ALBUTEROL 20-100 MCG/ACT IN AERS
4.0000 | INHALATION_SPRAY | Freq: Four times a day (QID) | RESPIRATORY_TRACT | Status: DC
  Administered 2019-11-06 – 2019-11-11 (×21): 4 via RESPIRATORY_TRACT
  Filled 2019-11-06: qty 4

## 2019-11-06 MED ORDER — MUPIROCIN 2 % EX OINT
1.0000 "application " | TOPICAL_OINTMENT | Freq: Two times a day (BID) | CUTANEOUS | Status: AC
  Administered 2019-11-06 – 2019-11-10 (×10): 1 via NASAL
  Filled 2019-11-06 (×2): qty 22

## 2019-11-06 NOTE — Consult Note (Addendum)
[place]  Telephone:([phone]) [phone] Fax:(336) [place]  Reason for Consultation: Metastatic renal cell carcinoma  HPI: [name] is a 74 year old female with a past medical history significant for severe COPD maintained on home oxygen, metastatic renal cell carcinoma with invasion into the left renal vein and IVC with associated thrombus, type 2 diabetes mellitus.  The patient was recently hospitalized 10/22/2019 through 10/25/2019 secondary to anemia. Xarelto was placed on hold during that admission.  The patient was seen by radiation oncology during that admission for consideration of palliative radiation to help control her bleeding.  She received a single fraction of radiation to her left renal mass on 10/27/2019.  The patient was discharged home with palliative care with plans to transition to hospice once palliative radiation complete.  The patient has transition to hospice.   The patient returned to the emergency room on 11/03/2019 secondary to fatigue and lack of appetite.  Her symptoms were worsening.  She lives with her husband and son who are both positive for COVID-19.  The patient was also found to be positive for COVID-19 on admission.  She was vaccinated against COVID-19 earlier this year in March 2021. Was given RegenCov on 11/04/2019 and she has been started on remdesivir and steroids.  Has been started on low-dose heparin.  When seen today, the patient reports that she is feeling fairly well. She still has fatigue. She has been able to eat and drink. Reports breathing is at baseline. Currently on 3 L of O2 by nasal cannula with baseline being 2 L/min. She currently denies chest pain, abdominal pain, nausea, vomiting. Denies headaches and dizziness. She has not noticed any recurrent bleeding despite being on heparin. The patient reports that she enrolled with hospice following her last admission. She anticipates being discharged home with hospice  versus residential hospice. Medical oncology was asked see the patient make recommendations regarding her metastatic renal cell carcinoma.   Past Medical History:  Diagnosis Date   Anemia    Anxiety    Arthritis    R leg   Cancer ([place])    kidney   Chronic fatigue 12/13/2016   CKD (chronic kidney disease), stage II    removed R kidney- 2010- for Cancer, followed by Dr. Diona Fanti   COPD (chronic obstructive pulmonary disease) ([place])    uses O2-2 liters , continuously , followed by Dr. Chase Caller   Cough 08-02-2015   Diabetes mellitus, type 2 ([place])    DM (diabetes mellitus) ([place]) 01/27/2012   DVT (deep venous thrombosis), left    2011- treated /w coumadin    Dyslipidemia    Dysrhythmia    Esophageal reflux    GERD (gastroesophageal reflux disease) 01/27/2012   Glaucoma    Goals of care, counseling/discussion 12/13/2016   Hemoptysis 08-02-15   History of radiation therapy 01/09/12,01/11/12,01/15/12,01/17/12,& 01/19/12   rul lung,50Gy/15fx   HTN (hypertension) 01/27/2012   Hyperlipidemia 01/27/2012   Hypertension    sees Dr. Deforest Hoyles for PCP, denies ever having stress or ECHO, ?when & where she had  last  EKG   Leukocytosis    Lung cancer ([place])    NSCLC   Osteopenia    Renal cell carcinoma of right kidney ([place]) 12/13/2016   Shortness of breath    Syncope    Tremor    Urinary incontinence    Vitamin D deficiency   :  Past Surgical History:  Procedure Laterality Date   BACK SURGERY  for pinched nerve, 2011, here at [place]     cataracts removed, ?IOL   FLEXIBLE BRONCHOSCOPY  11/29/2011   IR FLUORO GUIDE PORT INSERTION RIGHT  01/30/2017   IR US GUIDE VASC ACCESS RIGHT  01/30/2017   removed blood clot     left upper abd quad    right kidney removed     right nephrectomy    :  Current Facility-Administered Medications  Medication Dose Route Frequency Provider Last Rate Last Admin   0.9 %  sodium chloride infusion   Intravenous  PRN Clarnce Flock, MD 10 mL/hr at 11/06/19 0600 Rate Verify at 11/06/19 0600   acetaminophen (TYLENOL) tablet 650 mg  650 mg Oral Q6H PRN Clarnce Flock, MD   650 mg at 11/06/19 0013   Or   acetaminophen (TYLENOL) suppository 650 mg  650 mg Rectal Q6H PRN Clarnce Flock, MD       ALPRAZolam Duanne Moron) tablet 0.25 mg  0.25 mg Oral BID PRN Pershing Proud, NP       azithromycin (ZITHROMAX) tablet 500 mg  500 mg Oral Daily Elodia Florence., MD   500 mg at 11/06/19 0952   brinzolamide (AZOPT) 1 % ophthalmic suspension 1 drop  1 drop Both Eyes TID Clarnce Flock, MD   1 drop at 11/06/19 0955   ceFEPIme (MAXIPIME) 2 g in sodium chloride 0.9 % 100 mL IVPB  2 g Intravenous Q1400 Polly Cobia, RPH 200 mL/hr at 11/05/19 1512 IV Pump Association at 11/05/19 1512   Chlorhexidine Gluconate Cloth 2 % PADS 6 each  6 each Topical Daily Elodia Florence., MD   6 each at 11/06/19 0955   diltiazem (CARDIZEM CD) 24 hr capsule 120 mg  120 mg Oral Daily Bonnielee Haff, MD   120 mg at 11/06/19 9379   diphenhydrAMINE (BENADRYL) injection 50 mg  50 mg Intravenous Once PRN Clarnce Flock, MD       famotidine (PEPCID) IVPB 20 mg premix  20 mg Intravenous Once PRN Clarnce Flock, MD       heparin injection 5,000 Units  5,000 Units Subcutaneous Q12H Elodia Florence., MD   5,000 Units at 11/06/19 0240   HYDROmorphone (DILAUDID) injection 0.5-1 mg  0.5-1 mg Intravenous X7D PRN Pershing Proud, NP       Ipratropium-Albuterol (COMBIVENT) respimat 4 puff  4 puff Inhalation QID Bonnielee Haff, MD       lactose free nutrition (BOOST PLUS) liquid 237 mL  237 mL Oral TID WC Bonnielee Haff, MD   237 mL at 11/06/19 0953   latanoprost (XALATAN) 0.005 % ophthalmic solution 1 drop  1 drop Both Eyes QHS Clarnce Flock, MD   1 drop at 11/05/19 2131   methylPREDNISolone sodium succinate (SOLU-MEDROL) 40 mg/mL injection 40 mg  40 mg Intravenous Q12H Elodia Florence., MD    40 mg at 11/06/19 0013   mirtazapine (REMERON) tablet 7.5 mg  7.5 mg Oral Daily Clarnce Flock, MD   7.5 mg at 11/06/19 5329   multivitamin with minerals tablet 1 tablet  1 tablet Oral Daily Bonnielee Haff, MD   1 tablet at 11/06/19 0951   mupirocin ointment (BACTROBAN) 2 % 1 application  1 application Nasal BID Bonnielee Haff, MD   1 application at 92/42/68 0955   ondansetron (ZOFRAN) tablet 4 mg  4 mg Oral Q6H PRN Clarnce Flock, MD  Or   ondansetron (ZOFRAN) injection 4 mg  4 mg Intravenous Q6H PRN Clarnce Flock, MD   4 mg at 11/04/19 6789   oxyCODONE (Oxy IR/ROXICODONE) immediate release tablet 5 mg  5 mg Oral Q4H PRN Clarnce Flock, MD   5 mg at 11/06/19 0951   pantoprazole (PROTONIX) EC tablet 80 mg  80 mg Oral Q1200 Clarnce Flock, MD   80 mg at 11/05/19 1234   polyethylene glycol (MIRALAX / GLYCOLAX) packet 17 g  17 g Oral Daily PRN Clarnce Flock, MD       remdesivir 100 mg in sodium chloride 0.9 % 100 mL IVPB  100 mg Intravenous Daily Polly Cobia, RPH 200 mL/hr at 11/06/19 0957 100 mg at 11/06/19 0957   senna (SENOKOT) tablet 8.6 mg  1 tablet Oral QHS Pershing Proud, NP   8.6 mg at 11/05/19 2126   sodium chloride flush (NS) 0.9 % injection 10-40 mL  10-40 mL Intracatheter Q12H Elodia Florence., MD   10 mL at 11/06/19 3810   sodium chloride flush (NS) 0.9 % injection 10-40 mL  10-40 mL Intracatheter PRN Elodia Florence., MD       traZODone (DESYREL) tablet 50 mg  50 mg Oral QHS PRN Clarnce Flock, MD       vancomycin (VANCOREADY) IVPB 750 mg/150 mL  750 mg Intravenous Q24H Polly Cobia, RPH 150 mL/hr at 11/05/19 1407 750 mg at 11/05/19 1407   Facility-Administered Medications Ordered in Other Encounters  Medication Dose Route Frequency Provider Last Rate Last Admin   sodium chloride flush (NS) 0.9 % injection 10 mL  10 mL Intravenous PRN Curt Bears, MD       sodium chloride flush (NS) 0.9 % injection 10 mL  10  mL Intravenous PRN Curt Bears, MD   10 mL at 12/31/18 1348     No Known Allergies:  Family History  Problem Relation Age of Onset   Emphysema Father    Cancer Sister        behind eye   Cancer Brother        lung   Cancer Brother        lung   Cancer Brother        throat   Breast cancer Neg Hx   :  Social History   Socioeconomic History   Marital status: Married    Spouse name: Not on file   Number of children: Not on file   Years of education: Not on file   Highest education level: Not on file  Occupational History   Not on file  Tobacco Use   Smoking status: Former Smoker    Packs/day: 1.00    Years: 35.00    Pack years: 35.00    Types: Cigarettes    Quit date: 03/20/2004    Years since quitting: 15.6   Smokeless tobacco: Never Used  Scientific laboratory technician Use: Never used  Substance and Sexual Activity   Alcohol use: No   Drug use: No   Sexual activity: Not on file  Other Topics Concern   Not on file  Social History Narrative   Not on file   Social Determinants of Health   Financial Resource Strain:    Difficulty of Paying Living Expenses: Not on file  Food Insecurity:    Worried About Estate manager/land agent of Food in the Last Year: Not on file   [employer] of Food  in the Last Year: Not on file  Transportation Needs:    Lack of Transportation (Medical): Not on file   Lack of Transportation (Non-Medical): Not on file  Physical Activity:    Days of Exercise per Week: Not on file   Minutes of Exercise per Session: Not on file  Stress:    Feeling of Stress : Not on file  Social Connections:    Frequency of Communication with Friends and Family: Not on file   Frequency of Social Gatherings with Friends and Family: Not on file   Attends Religious Services: Not on file   Active Member of Clubs or Organizations: Not on file   Attends Archivist Meetings: Not on file   Marital Status: Not on file  Intimate Partner  Violence:    Fear of Current or Ex-Partner: Not on file   Emotionally Abused: Not on file   Physically Abused: Not on file   Sexually Abused: Not on file  :  Review of Systems: A comprehensive 14 point review of systems was negative except as noted in the HPI.  Exam: Patient Vitals for the past 24 hrs:  BP Temp Temp src Pulse Resp SpO2 Weight  11/06/19 0500 -- -- -- -- -- -- 55.1 kg  11/06/19 0416 117/72 97.8 F (36.6 C) -- 82 17 96 % --  11/05/19 2013 111/68 97.9 F (36.6 C) -- 68 17 94 % --  11/05/19 1611 117/77 97.7 F (36.5 C) Oral 93 18 96 % --  11/05/19 1210 127/68 (!) 97.5 F (36.4 C) Oral 92 20 100 % --   Full exam per primary team. Exam limited due to COVID-19 positive status.  General: Chronically ill-appearing female, no distress Skin exam was without echymosis, petichae.   Neuro exam was nonfocal. Patient was alert and oriented.  Attention was good.   Language was appropriate.  Mood was normal without depression.  Speech was not pressured.  Thought content was not tangential.     Lab Results  Component Value Date   WBC 10.0 11/06/2019   HGB 8.0 (L) 11/06/2019   HCT 27.4 (L) 11/06/2019   PLT 120 (L) 11/06/2019   GLUCOSE 136 (H) 11/06/2019   CHOL 177 01/28/2012   TRIG 234 (H) 01/28/2012   HDL 54 01/28/2012   LDLCALC 76 01/28/2012   ALT 43 11/06/2019   AST 51 (H) 11/06/2019   NA 138 11/06/2019   K 4.4 11/06/2019   CL 106 11/06/2019   CREATININE 1.44 (H) 11/06/2019   BUN 42 (H) 11/06/2019   CO2 22 11/06/2019    CT CHEST WO CONTRAST  Result Date: 11/04/2019 CLINICAL DATA:  Respiratory failure. Malaise. Patient with history of bilateral renal cell carcinoma. EXAM: CT CHEST WITHOUT CONTRAST TECHNIQUE: Multidetector CT imaging of the chest was performed following the standard protocol without IV contrast. COMPARISON:  March 26, 2019 FINDINGS: Cardiovascular: RIGHT-sided Port-A-Cath terminates at the caval to atrial junction. Calcified atheromatous plaque  of the thoracic aorta without aneurysmal dilation. Normal caliber central pulmonary vasculature. Normal heart size. Small pericardial effusion unchanged from previous exam in terms of volume. Calcified coronary artery disease. Mediastinum/Nodes: No thoracic inlet adenopathy. No mediastinal lymphadenopathy. No axillary lymphadenopathy. Esophagus grossly normal. Hilar structures with limited assessment due to lack of intravenous contrast. Lungs/Pleura: Background of severe pulmonary emphysema with bandlike scarring in the RIGHT upper lobe extending to the periphery Patchy bilateral basilar opacities, interval development of bronchial wall thickening at the lung bases. LEFT perihilar lesion measuring 3.9 x  3.3 cm with nodularity tracking along the bronchovascular bundle that has increased to a similar extent tracking into the medial LEFT lung base. Area previously measuring 3.3 x 2.3 cm Upper Abdomen: Gallbladder distension with cholelithiasis. No pericholecystic stranding though the gallbladder is incompletely imaged. Incomplete imaging of the kidneys with mass approximately 7.1 x 7.1 cm in the interpolar LEFT kidney invading the expanded LEFT renal vein. This area measured approximately 6.1 x 5.9 cm on the prior study. Renal venous caliber proximally 2.8 cm on the current study previously approximately 1.8 cm. Musculoskeletal: No acute musculoskeletal process to the extent evaluated. No destructive bone finding IMPRESSION: 1. Patchy bilateral basilar opacities, interval development of bronchial wall thickening at the lung bases, concerning for multifocal pneumonia, associated with small basilar effusions. 2. Enlarging LEFT perihilar and lower lobe process suspicious for worsening of disease in the chest/disease recurrence of uncertain significance in the current setting of suspected pneumonia. Close attention on follow-up is suggested. 3. Post treatment changes in the RIGHT upper lobe are similar to the prior exam.  4. Suspected worsening of LEFT renal mass and renal venous invasion. 5. Cholelithiasis with gallbladder distension that is grossly similar to previous imaging but incompletely imaged. 6. Calcified coronary artery disease. 7. Emphysema and aortic atherosclerosis. Aortic Atherosclerosis (ICD10-I70.0) and Emphysema (ICD10-J43.9). Electronically Signed   By: Zetta Bills M.D.   On: 11/04/2019 11:11   DG Chest Port 1 View  Result Date: 11/03/2019 CLINICAL DATA:  Dyspnea, COPD, kidney cancer EXAM: PORTABLE CHEST 1 VIEW COMPARISON:  08/21/2017 chest radiograph. FINDINGS: Right internal jugular Port-A-Cath terminates over cavoatrial junction. Stable cardiomediastinal silhouette with normal heart size. No pneumothorax. No pleural effusion. Emphysema. No pulmonary edema. Stable upper right parahilar parenchymal band. New thin parenchymal band in infrahilar left lung. No acute consolidative airspace disease. IMPRESSION: Emphysema. New mild linear scarring versus atelectasis in the infrahilar left lung. No acute consolidative airspace disease to suggest a pneumonia. Electronically Signed   By: Ilona Sorrel M.D.   On: 11/03/2019 17:53   VAS [place] LOWER EXTREMITY VENOUS (DVT)  Result Date: 11/04/2019  Lower Venous DVTStudy Indications: Elevated Ddimer.  Risk Factors: COVID 19 positive Cancer DVT. Comparison Study: No prior studies. Performing Technologist: Oliver Hum RVT  Examination Guidelines: A complete evaluation includes B-mode imaging, spectral Doppler, color Doppler, and power Doppler as needed of all accessible portions of each vessel. Bilateral testing is considered an integral part of a complete examination. Limited examinations for reoccurring indications may be performed as noted. The reflux portion of the exam is performed with the patient in reverse Trendelenburg.  +---------+---------------+---------+-----------+----------+--------------+  RIGHT      Compressibility Phasicity Spontaneity Properties Thrombus Aging  +---------+---------------+---------+-----------+----------+--------------+  CFV       Full            Yes       Yes                                    +---------+---------------+---------+-----------+----------+--------------+  SFJ       Full                                                             +---------+---------------+---------+-----------+----------+--------------+  FV Prox   Full                                                             +---------+---------------+---------+-----------+----------+--------------+  FV Mid    Full                                                             +---------+---------------+---------+-----------+----------+--------------+  FV Distal Full                                                             +---------+---------------+---------+-----------+----------+--------------+  PFV       Full                                                             +---------+---------------+---------+-----------+----------+--------------+  POP       Full            Yes       Yes                                    +---------+---------------+---------+-----------+----------+--------------+  PTV       Full                                                             +---------+---------------+---------+-----------+----------+--------------+  PERO      Full                                                             +---------+---------------+---------+-----------+----------+--------------+   +---------+---------------+---------+-----------+----------+-----------------+  LEFT      Compressibility Phasicity Spontaneity Properties Thrombus Aging     +---------+---------------+---------+-----------+----------+-----------------+  CFV       Full            Yes       Yes                                       +---------+---------------+---------+-----------+----------+-----------------+  SFJ       Full                                                                 +---------+---------------+---------+-----------+----------+-----------------+  FV Prox   Partial         Yes       Yes  Age Indeterminate  +---------+---------------+---------+-----------+----------+-----------------+  FV Mid    Full                                                                +---------+---------------+---------+-----------+----------+-----------------+  FV Distal Full                                                                +---------+---------------+---------+-----------+----------+-----------------+  PFV       Full                                                                +---------+---------------+---------+-----------+----------+-----------------+  POP       Partial         Yes       Yes                    Age Indeterminate  +---------+---------------+---------+-----------+----------+-----------------+  PTV       None                                             Age Indeterminate  +---------+---------------+---------+-----------+----------+-----------------+  PERO      Full                                                                +---------+---------------+---------+-----------+----------+-----------------+     Summary: RIGHT: - There is no evidence of deep vein thrombosis in the lower extremity.  - No cystic structure found in the popliteal fossa.  LEFT: - Findings consistent with age indeterminate deep vein thrombosis involving the left femoral vein, left popliteal vein, and left posterior tibial veins. - No cystic structure found in the popliteal fossa.  *See table(s) above for measurements and observations. Electronically signed by Deitra Mayo MD on 11/04/2019 at 5:09:43 PM.    Final      CT CHEST WO CONTRAST  Result Date: 11/04/2019 CLINICAL DATA:  Respiratory failure. Malaise. Patient with history of bilateral renal cell carcinoma. EXAM: CT CHEST WITHOUT CONTRAST TECHNIQUE: Multidetector CT imaging of  the chest was performed following the standard protocol without IV contrast. COMPARISON:  March 26, 2019 FINDINGS: Cardiovascular: RIGHT-sided Port-A-Cath terminates at the caval to atrial junction. Calcified atheromatous plaque of the thoracic aorta without aneurysmal dilation. Normal caliber central pulmonary vasculature. Normal heart size. Small pericardial effusion unchanged from previous exam in terms of volume. Calcified coronary artery disease. Mediastinum/Nodes: No thoracic inlet adenopathy. No mediastinal lymphadenopathy. No axillary lymphadenopathy. Esophagus grossly normal. Hilar structures with limited assessment due to lack of intravenous  contrast. Lungs/Pleura: Background of severe pulmonary emphysema with bandlike scarring in the RIGHT upper lobe extending to the periphery Patchy bilateral basilar opacities, interval development of bronchial wall thickening at the lung bases. LEFT perihilar lesion measuring 3.9 x 3.3 cm with nodularity tracking along the bronchovascular bundle that has increased to a similar extent tracking into the medial LEFT lung base. Area previously measuring 3.3 x 2.3 cm Upper Abdomen: Gallbladder distension with cholelithiasis. No pericholecystic stranding though the gallbladder is incompletely imaged. Incomplete imaging of the kidneys with mass approximately 7.1 x 7.1 cm in the interpolar LEFT kidney invading the expanded LEFT renal vein. This area measured approximately 6.1 x 5.9 cm on the prior study. Renal venous caliber proximally 2.8 cm on the current study previously approximately 1.8 cm. Musculoskeletal: No acute musculoskeletal process to the extent evaluated. No destructive bone finding IMPRESSION: 1. Patchy bilateral basilar opacities, interval development of bronchial wall thickening at the lung bases, concerning for multifocal pneumonia, associated with small basilar effusions. 2. Enlarging LEFT perihilar and lower lobe process suspicious for worsening of disease  in the chest/disease recurrence of uncertain significance in the current setting of suspected pneumonia. Close attention on follow-up is suggested. 3. Post treatment changes in the RIGHT upper lobe are similar to the prior exam. 4. Suspected worsening of LEFT renal mass and renal venous invasion. 5. Cholelithiasis with gallbladder distension that is grossly similar to previous imaging but incompletely imaged. 6. Calcified coronary artery disease. 7. Emphysema and aortic atherosclerosis. Aortic Atherosclerosis (ICD10-I70.0) and Emphysema (ICD10-J43.9). Electronically Signed   By: Zetta Bills M.D.   On: 11/04/2019 11:11   DG Chest Port 1 View  Result Date: 11/03/2019 CLINICAL DATA:  Dyspnea, COPD, kidney cancer EXAM: PORTABLE CHEST 1 VIEW COMPARISON:  08/21/2017 chest radiograph. FINDINGS: Right internal jugular Port-A-Cath terminates over cavoatrial junction. Stable cardiomediastinal silhouette with normal heart size. No pneumothorax. No pleural effusion. Emphysema. No pulmonary edema. Stable upper right parahilar parenchymal band. New thin parenchymal band in infrahilar left lung. No acute consolidative airspace disease. IMPRESSION: Emphysema. New mild linear scarring versus atelectasis in the infrahilar left lung. No acute consolidative airspace disease to suggest a pneumonia. Electronically Signed   By: Ilona Sorrel M.D.   On: 11/03/2019 17:53   VAS [place] LOWER EXTREMITY VENOUS (DVT)  Result Date: 11/04/2019  Lower Venous DVTStudy Indications: Elevated Ddimer.  Risk Factors: COVID 19 positive Cancer DVT. Comparison Study: No prior studies. Performing Technologist: Oliver Hum RVT  Examination Guidelines: A complete evaluation includes B-mode imaging, spectral Doppler, color Doppler, and power Doppler as needed of all accessible portions of each vessel. Bilateral testing is considered an integral part of a complete examination. Limited examinations for reoccurring indications may be performed as noted.  The reflux portion of the exam is performed with the patient in reverse Trendelenburg.  +---------+---------------+---------+-----------+----------+--------------+  RIGHT     Compressibility Phasicity Spontaneity Properties Thrombus Aging  +---------+---------------+---------+-----------+----------+--------------+  CFV       Full            Yes       Yes                                    +---------+---------------+---------+-----------+----------+--------------+  SFJ       Full                                                             +---------+---------------+---------+-----------+----------+--------------+  FV Prox   Full                                                             +---------+---------------+---------+-----------+----------+--------------+  FV Mid    Full                                                             +---------+---------------+---------+-----------+----------+--------------+  FV Distal Full                                                             +---------+---------------+---------+-----------+----------+--------------+  PFV       Full                                                             +---------+---------------+---------+-----------+----------+--------------+  POP       Full            Yes       Yes                                    +---------+---------------+---------+-----------+----------+--------------+  PTV       Full                                                             +---------+---------------+---------+-----------+----------+--------------+  PERO      Full                                                             +---------+---------------+---------+-----------+----------+--------------+   +---------+---------------+---------+-----------+----------+-----------------+  LEFT      Compressibility Phasicity Spontaneity Properties Thrombus Aging     +---------+---------------+---------+-----------+----------+-----------------+  CFV       Full             Yes       Yes                                       +---------+---------------+---------+-----------+----------+-----------------+  SFJ       Full                                                                +---------+---------------+---------+-----------+----------+-----------------+  FV Prox   Partial         Yes       Yes                    Age Indeterminate  +---------+---------------+---------+-----------+----------+-----------------+  FV Mid    Full                                                                +---------+---------------+---------+-----------+----------+-----------------+  FV Distal Full                                                                +---------+---------------+---------+-----------+----------+-----------------+  PFV       Full                                                                +---------+---------------+---------+-----------+----------+-----------------+  POP       Partial         Yes       Yes                    Age Indeterminate  +---------+---------------+---------+-----------+----------+-----------------+  PTV       None                                             Age Indeterminate  +---------+---------------+---------+-----------+----------+-----------------+  PERO      Full                                                                +---------+---------------+---------+-----------+----------+-----------------+     Summary: RIGHT: - There is no evidence of deep vein thrombosis in the lower extremity.  - No cystic structure found in the popliteal fossa.  LEFT: - Findings consistent with age indeterminate deep vein thrombosis involving the left femoral vein, left popliteal vein, and left posterior tibial veins. - No cystic structure found in the popliteal fossa.  *See table(s) above for measurements and observations. Electronically signed by Deitra Mayo MD on 11/04/2019 at 5:09:43 PM.    Final    Assessment and Plan:  1.  Acute on chronic hypoxic  respiratory failure secondary to COVID-19 pneumonia 2.  Metastatic renal cell carcinoma with invasion into the left renal vein and IVC with associated thrombus 3.  Anemia 4.  DVT of the left common femoral vein 5.  Glaucoma 6.  COPD 7.  Hypertension 8.  Type 2 diabetes mellitus 9.  Hyperlipidemia 10.  Insomnia 11.  GERD  -The patient's respiratory status is stable overall.  She remains on dexamethasone and remdesivir. Further management of COVID-19 infection per hospitalist. -As previously discussed with the patient, she is not a candidate for any additional therapy for her renal cell carcinoma. Recommend best supportive care. Agree with DNR status. Recommend for the patient to be discharged home to hospice versus residential hospice depending on her social situation. -Patient's hemoglobin is stable at this time. She has no bleeding despite being on heparin. Recommend monitoring very closely for any bleeding and discontinue heparin if bleeding develops. Transfuse PRBCs for hemoglobin less than 7. -Continue management of chronic medical conditions per hospitalist.  Thank you for this referral.   Mikey Bussing, DNP, AGPCNP-BC, AOCNP  ADDENDUM: I saw and examined Ms. Litzenberger.  I agree with her assessment.  We clearly are just in a quality life mood.  She is not a candidate for any systemic therapy for the metastatic renal cell carcinoma.  Hopefully, she will get through the Covid infection.  She will clearly need to have Hospice follow her up when she goes home.  She says she not bleeding any longer.  I think she has a palliative radiation therapy to the pelvic area.  She is off anticoagulation.  Her hemoglobin is down a little bit.  Again I am sure this is from underlying history of bleeding.  She also has some renal insufficiency.  I probably would hold off on transfusing her.  I am described that she is not hurting.  I would she is not having dyspnea.  I am glad that she seems to be  relatively comfortable.  Again, our goal is to make sure that she has comfort and quality of life.  To that goal, Hospice will be the best way for [place] to try to help.  I really appreciate the wonderful care that she is getting from all the staff upon [facility] is so nice.  We had a very good prayer this morning.  Her faith remains strong.  She knows where she is going and is not afraid.  Lattie Haw, MD  Oswaldo Milian 26:4

## 2019-11-06 NOTE — Progress Notes (Addendum)
PROGRESS NOTE    Melanie Best  CXK:[id number] DOB: 09-01-1945 DOA: 11/03/2019 PCP: [name], MD   Chief Complaint  Patient presents with  . Shortness of Breath  . Fatigue    Brief Narrative:  Melanie Best is a 74 y.o. female [place] a 74 y.o.femalewith severe COPD on home O2,leftrenal cell carcinomawith invasion into the left renal vein and IVC with associated thrombus on Xarelto, presents with fatigue.  She was admitted from August 4 to August 7 after being sent from her hematologist office with finding of new acute blood loss anemia secondary to gross hematuria from her known renal cell carcinoma and anticoagulation with Xarelto.  During admission she was transfused 2 units PRBC with her hemoglobin rising to 9.3 at discharge.  Her Xarelto was held at discharge given her poor prognosis there is no further plan for anticoagulation.  She was also seen by palliative care with plan to go home on palliative versus hospice, and was made DNR at that time.  She presented to the ED for fatigue and lack of appetite.  She lives with her husband and her son, her son has been admitted to the hospital due to Covid in the past week.  She reports that she has received her Covid vaccine series back in March, she endorses a chronic shortness of breath but states that it is not any different than how she usually feels.  She has been on her usual 2 L of oxygen without needing additional oxygen.  With regards to hospice she is unsure of details, but states that a hospital bed was brought out to her home.  Review of chart also shows that she received palliative radiation on August 9.  At discharge she was prescribed morphine and fentanyl patches for air hunger but states she does not take this because she does not like the way it makes her feel.  Work-up in the ED showed vital signs with slightly soft blood pressures in 90s over 40s that are lower compared to her baseline,  stable hemoglobin since discharge at 8.4, and CMP showing increasing creatinine from 1.4 to 2.14 since discharge.  She was also found to be incidentally Covid positive.  She was admitted for management of AKI.  Subsequently developed respiratory failure as well.  Assessment & Plan:   Acute on Chronic Hypoxic Respiratory Failure 2/2 COVID 19 Pneumonia/concern for healthcare associated pneumonia This is a breakthrough infection.  Patient previously vaccinated.  At baseline she is on 2 L of oxygen.    CT chest with patchy bilateral basilar opacities concerning for multifocal pneumonia with small basilar effusions as well as enlarging L perihilar and lower lobe process concerning for worsening of disease in chest/disease recurrence of uncertain significance in current setting of pneumonia (see report) She was given RegenCov on 8/17. Patient was initially requiring 6 L of oxygen.  Has been slowly weaned down and currently on 3 to 4 L of oxygen.  She is feeling more dyspneic today.  There is wheezing appreciated on examination.  She will be started on Combivent inhaler. Continue with Remdesivir steroids. She was also noted to have significantly elevated procalcitonin level and was started on vancomycin cefepime and azithromycin.  Procalcitonin trended from 6.86-12.31 and subsequently to 8.30.  Cultures negative so far.  MRSA PCR is positive.  Continue current antibiotics for now. Not a candidate for Actemra or baricitinib due to elevated procalcitonin. Continue to trend inflammatory markers and D-dimer. Incentive spirometry, prone positioning any,  mobilization as much as possible.  COVID-19 Labs  Recent Labs    11/04/19 0956 11/05/19 0555  DDIMER 6.56* 3.80*  FERRITIN 2,067* 2,052*  CRP 20.6* 23.2*    AKI on CKD IIIb Baseline creatinine 1.4.  Presented with creatinine of 2.14.  Was gently hydrated.  Renal function back to baseline.  Monitor urine output.  Elevated D dimer  Hx Left Common  Femoral Vein DVT Patient with known hx of DVT which per documentation of vascular surgery note was dx in 03/2019 and she'd been on anticoagulation for over 6 months.  There was a discussion of IVC filter placement at the last hospitalization, but the risk of filter was thought to exceed benefit and the recommendation was for stopping anticoagulation and only recommending filter if recurrence of thrombotic disease. Lower extremity Doppler study done during this hospitalization shows indeterminate age DVT in the left leg.  This is thought to be an old DVT based on discussion with vascular surgery.  No anticoagulation at this time due to risk of bleeding.  Currently on prophylactic doses of subcutaneous heparin.  Renal Cell Carcinoma  Tumor Thrombus Per discharge summary from last admission, patient went home on palliative care/hospice. Received palliative radiation on 8/9. She's currently under the care of [facility]. Palliative care has been consulted to assist with goals of care. Plan is to continue current treatment without any escalation.  History of COPD on home oxygen at 2 L/min Stable.  Continue inhalers.  Essential hypertension Diltiazem was held at admission due to borderline low blood pressures.  This was resumed yesterday at low-dose.  Blood pressure stable.  Continue to monitor.    GERD Continue PPI  Macrocytic Anemia Hemoglobin noted to be stable.  No evidence of overt bleeding.    Anxiety/depression Continue mirtazapine  DVT prophylaxis: Subcutaneous Code Status: DNR Family Communication: Discussed with patient's son. Disposition: Unclear disposition at this time.  Patient was getting hospice services at home.  PT and OT evaluation.  Status is: Inpatient  Remains inpatient appropriate because:IV treatments appropriate due to intensity of illness or inability to take PO and Inpatient level of care appropriate due to severity of illness   Dispo:  Patient From:  Home  Planned Disposition: To be determined  Expected discharge date: 11/07/19  Medically stable for discharge: No     Consultants:   Palliative Care  Oncology  Procedures:   none  Antimicrobials:  Anti-infectives (From admission, onward)   Start     Dose/Rate Route Frequency Ordered Stop   11/05/19 1430  vancomycin (VANCOREADY) IVPB 500 mg/100 mL  Status:  Discontinued        500 mg 100 mL/hr over 60 Minutes Intravenous Every 24 hours 11/04/19 1241 11/05/19 1203   11/05/19 1430  vancomycin (VANCOREADY) IVPB 750 mg/150 mL        750 mg 150 mL/hr over 60 Minutes Intravenous Every 24 hours 11/05/19 1203     11/05/19 1000  remdesivir 100 mg in sodium chloride 0.9 % 100 mL IVPB       "Followed by" Linked Group Details   100 mg 200 mL/hr over 30 Minutes Intravenous Daily 11/04/19 1239 11/09/19 0959   11/04/19 1500  remdesivir 200 mg in sodium chloride 0.9% 250 mL IVPB       "Followed by" Linked Group Details   200 mg 580 mL/hr over 30 Minutes Intravenous Once 11/04/19 1239 11/04/19 1449   11/04/19 1330  vancomycin (VANCOCIN) IVPB 1000 mg/200 mL premix  1,000 mg 200 mL/hr over 60 Minutes Intravenous  Once 11/04/19 1239 11/04/19 1428   11/04/19 1300  ceFEPIme (MAXIPIME) 2 g in sodium chloride 0.9 % 100 mL IVPB        2 g 200 mL/hr over 30 Minutes Intravenous Daily 11/04/19 1239     11/04/19 1230  cefTRIAXone (ROCEPHIN) 2 g in sodium chloride 0.9 % 100 mL IVPB  Status:  Discontinued        2 g 200 mL/hr over 30 Minutes Intravenous Every 24 hours 11/04/19 1227 11/04/19 1229   11/04/19 1230  azithromycin (ZITHROMAX) tablet 500 mg        500 mg Oral Daily 11/04/19 1227 11/09/19 0959     Subjective: Patient states that she is having some difficulty breathing today.  Denies any chest pain.  No nausea vomiting.  Dry cough.  Objective: Vitals:   11/05/19 1611 11/05/19 2013 11/06/19 0416 11/06/19 0500  BP: 117/77 111/68 117/72   Pulse: 93 68 82   Resp: 18 17 17    Temp:  97.7 F (36.5 C) 97.9 F (36.6 C) 97.8 F (36.6 C)   TempSrc: Oral     SpO2: 96% 94% 96%   Weight:    55.1 kg  Height:        Intake/Output Summary (Last 24 hours) at 11/06/2019 1324 Last data filed at 11/06/2019 1300 Gross per 24 hour  Intake 1492.94 ml  Output 750 ml  Net 742.94 ml   Filed Weights   11/05/19 0025 11/05/19 0428 11/06/19 0500  Weight: 58.8 kg 58.9 kg 55.1 kg    Examination:  General appearance: Awake alert.  In no distress Resp: End expiratory wheezing heard bilaterally.  She is noted to be tachypneic.  Few crackles at the bases.   Cardio: S1-S2 is normal regular.  No S3-S4.  No rubs murmurs or bruit GI: Abdomen is soft.  Nontender nondistended.  Bowel sounds are present normal.  No masses organomegaly Extremities: No edema.  Moving all her extremities Neurologic: No focal neurological deficits.      Data Reviewed: I have personally reviewed following labs and imaging studies  CBC: Recent Labs  Lab 11/03/19 1624 11/04/19 0445 11/05/19 0555 11/06/19 0455  WBC 12.0* 11.6* 11.4* 10.0  NEUTROABS 10.4*  --  10.6*  --   HGB 8.4* 8.2* 8.3* 8.0*  HCT 28.7* 28.2* 28.2* 27.4*  MCV 101.8* 103.3* 100.7* 99.6  PLT 128* 130* 122* 120*    Basic Metabolic Panel: Recent Labs  Lab 11/03/19 1624 11/04/19 0445 11/05/19 0555 11/06/19 0455  NA 136 140 138 138  K 4.9 4.6 4.5 4.4  CL 102 108 106 106  CO2 21* 18* 20* 22  GLUCOSE 82 71 168* 136*  BUN 39* 36* 35* 42*  CREATININE 2.14* 1.69* 1.45* 1.44*  CALCIUM 7.7* 7.1* 7.5* 7.7*  MG  --   --  1.9  --   PHOS  --   --  4.1  --     GFR: Estimated Creatinine Clearance: 30.7 mL/min (A) (by C-G formula based on SCr of 1.44 mg/dL (H)).  Liver Function Tests: Recent Labs  Lab 11/03/19 1624 11/05/19 0555 11/06/19 0455  AST 75* 71* 51*  ALT 45* 45* 43  ALKPHOS 134* 337* 264*  BILITOT 1.2 0.6 0.6  PROT 5.6* 5.4* 5.3*  ALBUMIN 2.6* 2.4* 2.4*    CBG: Recent Labs  Lab 11/04/19 0843 11/04/19 0927  11/04/19 1305  GLUCAP 66* 94 103*     Recent Results (from  the past 240 hour(s))  SARS Coronavirus 2 by RT PCR (hospital order, performed in [facility] hospital lab) Nasopharyngeal Nasopharyngeal Swab     Status: Abnormal   Collection Time: 11/03/19  4:40 PM   Specimen: Nasopharyngeal Swab  Result Value Ref Range Status   SARS Coronavirus 2 POSITIVE (A) NEGATIVE Final    Comment: RESULT CALLED TO, READ BACK BY AND VERIFIED WITH: C.Rosine Abe [id number] @1859  BY V.WILKINS (NOTE) SARS-CoV-2 target nucleic acids are DETECTED  SARS-CoV-2 RNA is generally detectable in upper respiratory specimens  during the acute phase of infection.  Positive results are indicative  of the presence of the identified virus, but do not rule out bacterial infection or co-infection with other pathogens not detected by the test.  Clinical correlation with patient history and  other diagnostic information is necessary to determine patient infection status.  The expected result is negative.  Fact Sheet for Patients:   StrictlyIdeas.no   Fact Sheet for Healthcare Providers:   BankingDealers.co.za    This test is not yet approved or cleared by the [place] FDA and  has been authorized for detection and/or diagnosis of SARS-CoV-2 by FDA under an Emergency Use Authorization (EUA).  This EUA will remain in effect (meaning th is test can be used) for the duration of  the COVID-19 declaration under Section 564(b)(1) of the Act, 21 U.S.C. section 360-bbb-3(b)(1), unless the authorization is terminated or revoked sooner.  Performed at [facility], [place] [address], [place], [place] [address]   Culture, blood (routine x 2) Call MD if unable to obtain prior to antibiotics being given     Status: None (Preliminary result)   Collection Time: 11/04/19  1:10 PM   Specimen: Left Antecubital; Blood  Result Value Ref Range Status   Specimen  Description   Final    LEFT ANTECUBITAL Performed at [facility] [address], [place], [place] [address]    Special Requests   Final    BOTTLES DRAWN AEROBIC AND ANAEROBIC Blood Culture results may not be optimal due to an excessive volume of blood received in culture bottles Performed at [facility] [address] [place], [place] [address]    Culture   Final    NO GROWTH 2 DAYS Performed at [facility] [address], [place], [place] [address]    Report Status PENDING  Incomplete  Culture, blood (routine x 2) Call MD if unable to obtain prior to antibiotics being given     Status: None (Preliminary result)   Collection Time: 11/04/19  1:20 PM   Specimen: Right Antecubital; Blood  Result Value Ref Range Status   Specimen Description   Final    RIGHT ANTECUBITAL Performed at [facility] [address] [place], [place] [address]    Special Requests   Final    BOTTLES DRAWN AEROBIC AND ANAEROBIC Blood Culture adequate volume Performed at [facility] [address], [place], [place] [address]    Culture   Final    NO GROWTH 2 DAYS Performed at [facility] [address], [place], [place] [address]    Report Status PENDING  Incomplete  MRSA PCR Screening     Status: Abnormal   Collection Time: 11/05/19 12:55 PM   Specimen: Nasal Mucosa; Nasopharyngeal  Result Value Ref Range Status   MRSA by PCR POSITIVE (A) NEGATIVE Final    Comment:  The GeneXpert MRSA Assay (FDA approved for NASAL specimens only), is one component of a comprehensive MRSA colonization surveillance program. It is not intended to diagnose MRSA infection nor to guide or monitor treatment for MRSA infections. RESULT CALLED TO, READ BACK BY AND VERIFIED WITH: H PARISH AT 5009 ON 11/05/2019 BY MOSLEY,J  Performed at [facility], [place] [address], [place], [place] [address]           Radiology Studies: VAS [place] LOWER EXTREMITY VENOUS (DVT)  Result Date: 11/04/2019  Lower Venous DVTStudy Indications: Elevated Ddimer.  Risk Factors: COVID 19 positive Cancer DVT. Comparison Study: No prior studies. Performing Technologist: Oliver Hum RVT  Examination Guidelines: A complete evaluation includes B-mode imaging, spectral Doppler, color Doppler, and power Doppler as needed of all accessible portions of each vessel. Bilateral testing is considered an integral part of a complete examination. Limited examinations for reoccurring indications may be performed as noted. The reflux portion of the exam is performed with the patient in reverse Trendelenburg.  +---------+---------------+---------+-----------+----------+--------------+ RIGHT    CompressibilityPhasicitySpontaneityPropertiesThrombus Aging +---------+---------------+---------+-----------+----------+--------------+ CFV      Full           Yes      Yes                                 +---------+---------------+---------+-----------+----------+--------------+ SFJ      Full                                                        +---------+---------------+---------+-----------+----------+--------------+ FV Prox  Full                                                        +---------+---------------+---------+-----------+----------+--------------+ FV Mid   Full                                                        +---------+---------------+---------+-----------+----------+--------------+ FV DistalFull                                                        +---------+---------------+---------+-----------+----------+--------------+ PFV      Full                                                        +---------+---------------+---------+-----------+----------+--------------+ POP      Full           Yes      Yes                                  +---------+---------------+---------+-----------+----------+--------------+  PTV      Full                                                        +---------+---------------+---------+-----------+----------+--------------+ PERO     Full                                                        +---------+---------------+---------+-----------+----------+--------------+   +---------+---------------+---------+-----------+----------+-----------------+ LEFT     CompressibilityPhasicitySpontaneityPropertiesThrombus Aging    +---------+---------------+---------+-----------+----------+-----------------+ CFV      Full           Yes      Yes                                    +---------+---------------+---------+-----------+----------+-----------------+ SFJ      Full                                                           +---------+---------------+---------+-----------+----------+-----------------+ FV Prox  Partial        Yes      Yes                  Age Indeterminate +---------+---------------+---------+-----------+----------+-----------------+ FV Mid   Full                                                           +---------+---------------+---------+-----------+----------+-----------------+ FV DistalFull                                                           +---------+---------------+---------+-----------+----------+-----------------+ PFV      Full                                                           +---------+---------------+---------+-----------+----------+-----------------+ POP      Partial        Yes      Yes                  Age Indeterminate +---------+---------------+---------+-----------+----------+-----------------+ PTV      None                                         Age Indeterminate +---------+---------------+---------+-----------+----------+-----------------+ PERO     Full                                                            +---------+---------------+---------+-----------+----------+-----------------+  Summary: RIGHT: - There is no evidence of deep vein thrombosis in the lower extremity.  - No cystic structure found in the popliteal fossa.  LEFT: - Findings consistent with age indeterminate deep vein thrombosis involving the left femoral vein, left popliteal vein, and left posterior tibial veins. - No cystic structure found in the popliteal fossa.  *See table(s) above for measurements and observations. Electronically signed by Deitra Mayo MD on 11/04/2019 at 5:09:43 PM.    Final      Scheduled Meds: . azithromycin  500 mg Oral Daily  . brinzolamide  1 drop Both Eyes TID  . Chlorhexidine Gluconate Cloth  6 each Topical Daily  . diltiazem  120 mg Oral Daily  . heparin injection (subcutaneous)  5,000 Units Subcutaneous Q12H  . Ipratropium-Albuterol  4 puff Inhalation QID  . lactose free nutrition  237 mL Oral TID WC  . latanoprost  1 drop Both Eyes QHS  . methylPREDNISolone (SOLU-MEDROL) injection  40 mg Intravenous Q12H  . mirtazapine  7.5 mg Oral Daily  . multivitamin with minerals  1 tablet Oral Daily  . mupirocin ointment  1 application Nasal BID  . pantoprazole  80 mg Oral Q1200  . senna  1 tablet Oral QHS  . sodium chloride flush  10-40 mL Intracatheter Q12H   Continuous Infusions: . sodium chloride 10 mL/hr at 11/06/19 0600  . ceFEPime (MAXIPIME) IV 200 mL/hr at 11/05/19 1512  . famotidine (PEPCID) IV    . remdesivir 100 mg in NS 100 mL 100 mg (11/06/19 0957)  . vancomycin 750 mg (11/05/19 1407)     LOS: 2 days    Bonnielee Haff, MD [facility]   To contact the attending provider between 7A-7P or the covering provider during after hours 7P-7A, please log into the web site [email or web address] and access using universal [place] password for that web site. If you do not have the password, please call the hospital operator.  11/06/2019, 1:24 PM

## 2019-11-06 NOTE — Progress Notes (Signed)
WL 1536 - Manufacturing engineer [facility]) [facility] Liaison Note    Melanie Best is a current hospice patient with [facility] with a terminal diagnosis of Renal Cell Carcinoma. Patient initiated call to EMS from the home for evaluation of increased SOB/pain/fatigue.  Patient was transported to [facility] then admitted on 11/04/19 for management of AKI.  This is a related admission. Patient is a DNR.   Spoke with bedoside RN, Melanie Best, who reports pt doing well, c/o generalized pain but no SOB.  Melanie Best reports poor PO intake. Patient is currently on 3.5 L O2 with 98% sats. RN states that the plan is for patient to continue on IV antibiotics with a possible discharge in 24-48hours if medically stable. Unsuccessful in reaching patient via telephone in hospital room. Verified with RN  that in person visitors limited at hospital due to [place] precautions. Spoke with Melanie Best,  patient's spouse, to share RN report and to offer support.  Melanie Best reports he himself is feeling better today and verbalized pleasure to hear that the pt is doing well.     V/S:  97.8 oral, 117/72, 82 HR, 17 RR, SpO2 94% 4L I&O: 1372.11/1548 Abnormal lab work: (11/06/19)  Hgb 8.0, Plate 120, BUN 42, Creat 1.44, Cal 7.7, Alk Phos 264, AST 51, Alb 2.4, Total protein 5.3, procalcitonin 8.3, MRSA +, SARS Covid 19 + Diagnostics: no new results  IVs: Remdesivir 100 mg PIV QD, Maxipime 2g in NS 1657m IVPB QD, Vancomycin IVPB 75062m100ml QD PRNs: Oxycodone 57m72mO PRN   Problem list from EPIC: Assessment and Plan: (from Oncology NP visit today, 11/06/19)  1.  Acute on chronic hypoxic respiratory failure secondary to COVID-19 pneumonia 2.  Metastatic renal cell carcinoma with invasion into the left renal vein and IVC with associated thrombus 3.  Anemia 4.  DVT of the left common femoral vein 5.  Glaucoma 6.  COPD 7.  Hypertension 8.  Type 2 diabetes mellitus 9.  Hyperlipidemia 10.  Insomnia 11.  GERD   -The patient's respiratory status is stable  overall. She remains on dexamethasone and remdesivir. Further management of COVID-19 infection per hospitalist. -As previously discussed with the patient, she is not a candidate for any additional therapy for her renal cell carcinoma. Recommend best supportive care. Agree with DNR status. Recommend for the patient to be discharged home to hospice versus residential hospice depending on her social situation. -Patient's hemoglobin is stable at this time. She has no bleeding despite being on heparin. Recommend monitoring very closely for any bleeding and discontinue heparin if bleeding develops. Transfuse PRBCs for hemoglobin less than 7. -Continue management of chronic medical conditions per hospitalist.    D/C planning: Patient is not medically stable to discharge - D/C date unknown at this time. Once disposition is determined - Please use [facility] for transportation needs of [facility] patients. Goals of Care: Clear: DNR - patient family confirmed continued wishes for patient to discharge home with hospice upon discharge. Communication with IDT: Updated [facility] with patient condition/status. Transfer summary completed on 11/04/19. Communication with PCG: Supported Patient spouse via telephone - has [facility] information/enc to call as needed. Informed that [facility] [place] will follow patient daily during this hospitalization.   Please call with any hospice related questions/concerns,   ChrDomenic MorasSN, RN [facility] [phone] on call)

## 2019-11-06 NOTE — Progress Notes (Signed)
   11/05/19 0817  Assess: MEWS Score  Temp 97.8 F (36.6 C)  BP (!) 153/75  Pulse Rate (!) 111  Resp 20  SpO2 100 %  O2 Device Nasal Cannula  O2 Flow Rate (L/min) 5 L/min  Assess: MEWS Score  MEWS Temp 0  MEWS Systolic 0  MEWS Pulse 2  MEWS RR 0  MEWS LOC 0  MEWS Score 2  MEWS Score Color Yellow  Assess: if the MEWS score is Yellow or Red  Were vital signs taken at a resting state? Yes  Focused Assessment No change from prior assessment  Early Detection of Sepsis Score *See Row Information* High  MEWS guidelines implemented *See Row Information* Yes  Treat  MEWS Interventions Administered scheduled meds/treatments  Pain Scale 0-10  Pain Score 0  Take Vital Signs  Increase Vital Sign Frequency  Yellow: Q 2hr X 2 then Q 4hr X 2, if remains yellow, continue Q 4hrs  Escalate  MEWS: Escalate Yellow: discuss with charge nurse/RN and consider discussing with provider and RRT  Notify: Charge Nurse/RN  Name of Charge Nurse/RN Notified Kim, RN  Date Charge Nurse/RN Notified 11/05/19  Time Charge Nurse/RN Notified [phone]  Document  Patient Outcome Stabilized after interventions

## 2019-11-07 LAB — COMPREHENSIVE METABOLIC PANEL
ALT: 40 U/L (ref 0–44)
AST: 44 U/L — ABNORMAL HIGH (ref 15–41)
Albumin: 2.4 g/dL — ABNORMAL LOW (ref 3.5–5.0)
Alkaline Phosphatase: 232 U/L — ABNORMAL HIGH (ref 38–126)
Anion gap: 10 (ref 5–15)
BUN: 49 mg/dL — ABNORMAL HIGH (ref 8–23)
CO2: 23 mmol/L (ref 22–32)
Calcium: 8 mg/dL — ABNORMAL LOW (ref 8.9–10.3)
Chloride: 107 mmol/L (ref 98–111)
Creatinine, Ser: 1.44 mg/dL — ABNORMAL HIGH (ref 0.44–1.00)
GFR calc Af Amer: 42 mL/min — ABNORMAL LOW (ref >60)
GFR calc non Af Amer: 36 mL/min — ABNORMAL LOW (ref >60)
Glucose, Bld: 193 mg/dL — ABNORMAL HIGH (ref 70–99)
Potassium: 4 mmol/L (ref 3.5–5.1)
Sodium: 140 mmol/L (ref 135–145)
Total Bilirubin: 0.4 mg/dL (ref 0.3–1.2)
Total Protein: 5 g/dL — ABNORMAL LOW (ref 6.5–8.1)

## 2019-11-07 LAB — CBC
HCT: 26.5 % — ABNORMAL LOW (ref 36.0–46.0)
Hemoglobin: 7.7 g/dL — ABNORMAL LOW (ref 12.0–15.0)
MCH: 29.1 pg (ref 26.0–34.0)
MCHC: 29.1 g/dL — ABNORMAL LOW (ref 30.0–36.0)
MCV: 100 fL (ref 80.0–100.0)
Platelets: 109 10*3/uL — ABNORMAL LOW (ref 150–400)
RBC: 2.65 MIL/uL — ABNORMAL LOW (ref 3.87–5.11)
RDW: 17.7 % — ABNORMAL HIGH (ref 11.5–15.5)
WBC: 9.6 10*3/uL (ref 4.0–10.5)
nRBC: 0.5 % — ABNORMAL HIGH (ref 0.0–0.2)

## 2019-11-07 LAB — D-DIMER, QUANTITATIVE: D-Dimer, Quant: 2.73 ug/mL-FEU — ABNORMAL HIGH (ref 0.00–0.50)

## 2019-11-07 LAB — C-REACTIVE PROTEIN: CRP: 7.3 mg/dL — ABNORMAL HIGH (ref <1.0)

## 2019-11-07 MED ORDER — SENNA 8.6 MG PO TABS
2.0000 | ORAL_TABLET | Freq: Two times a day (BID) | ORAL | Status: DC
  Administered 2019-11-07 – 2019-11-11 (×6): 17.2 mg via ORAL
  Filled 2019-11-07 (×8): qty 2

## 2019-11-07 MED ORDER — POLYETHYLENE GLYCOL 3350 17 G PO PACK
17.0000 g | PACK | Freq: Two times a day (BID) | ORAL | Status: DC
  Administered 2019-11-07 – 2019-11-11 (×6): 17 g via ORAL
  Filled 2019-11-07 (×6): qty 1

## 2019-11-07 NOTE — Care Management Important Message (Signed)
Important Message  Patient Details IM Letter given to the Patient Name: Melanie Best MRN: [id number] Date of Birth: [birth year]   Medicare Important Message Given:  Yes     Kerin Salen 11/07/2019, 9:54 AM

## 2019-11-07 NOTE — Progress Notes (Signed)
[facility] 1536 - Manufacturing engineer [facility]) [facility] Liaison Note  Melanie Best is a current hospice patient with [facility] with a terminal diagnosis of Renal Cell Carcinoma. Patient initiated call to EMS from the home for evaluation of increased SOB/pain/fatigue. Patient was transported to [facility] then admitted on 11/04/19 for management of AKI. This is a related admission. Patient is a DNR.  In person visits restricted due to hospital Covid precautions. Received report from RN. The patient has c/o pain in her abdomen, the patient is having a hard time catching her breath today and is spitting up thick secretions. She is still on 5L of Oxygen and her O2 sats maintain at 85%-90% at rest. She is going to try and get up in the chair later this evening. But, for right now the prone position seems to be the most comfortable for her. She is not eating today and has had very little po intake.   VS: 98.4, 114/60, 78, 17, 97% 3Lnc I/O: 1150.10/1798 Abnormal Labs: Glucose 193, BUN 49, Creatinine 1.44, Calcium 8.0, Alkaline phos 232, Albumin 2.4, AST 44, Total protein 5.0, GFR 36, CRP 7.3, RBC 2.65, Hgb 7.7, HCT26.5, MCHC 29.1, 17.7 Platelets, nRBC 0.5, D-dimer 2.73, MRSA +,  Diagnostics: no new results  IV/PRN medications: Remdesivir 100 mg PIV QD, Maxipime 2g in NS 169ml IVPB QD, Vancomycin IVPB 7500mg /147ml QD PRNs: Oxycodone 5mg  PO PRN @ 1006   Problem list from Epic: Assessment & Plan: Acute on Chronic Hypoxic Respiratory Failure 2/2 COVID 19 Pneumonia/concern for healthcare associated pneumonia This is a breakthrough infection.  Patient previously vaccinated.  At baseline she is on 2 L of oxygen.    CT chest with patchy bilateral basilar opacities concerning for multifocal pneumonia with small basilar effusions as well as enlarging L perihilar and lower lobe process concerning for worsening of disease in chest/disease recurrence of uncertain significance in current setting of pneumonia (see report) She was given  RegenCov on 8/17. Patient was initially requiring 6 L of oxygen.  She was successfully weaned down to 3 L of oxygen saturating in the mid 90s. However she continues to feel dyspneic.  She is noted to have wheezing on examination.  Started on Combivent inhaler yesterday.  She remains on steroids.  Consider repeating chest x-ray tomorrow morning.   Continue with Remdesivir: Today will be the fourth day.   She was also noted to have significantly elevated procalcitonin level and was started on vancomycin cefepime and azithromycin.  Procalcitonin trended from 6.86-12.31 and subsequently to 8.30.  Cultures negative so far.  MRSA PCR is positive.  Continue current antibiotics for now.  Will determine duration based on chest x-ray tomorrow morning as well as her clinical status. Not a candidate for Actemra or baricitinib due to elevated procalcitonin. CRP has improved to 7.3.  D-dimer improved to 2.73.   Incentive spirometry, prone positioning any, mobilization as much as possible. 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  Elevated D dimer  Hx Left Common Femoral Vein DVT Renal Cell Carcinoma  Tumor Thrombus Macrocytic Anemia-Transfuse if it drops below 7.  D/C planning: Patient is not medically stable to discharge - D/C date unknown at this time. Once disposition is determined - Please use [facility] for transportation needs of [facility] patients.  Goals of Care: Clear: DNR - patient family confirmed continued wishes for patient to discharge home with hospice upon discharge.  Communication with IDT: Updated [place] team with patient condition/status.   Communication with PCG: Supported Patient spouse via  telephone - has [facility] contact information/enc to call as needed. Informed that an [place] [facility] will follow patient daily during this hospitalization. Offered support.   Please call with any hospice related questions.  Farrel Gordon, RN, CCM  [facility] Liaison (listed on [place] under  Hospice[facility])    [phone]

## 2019-11-07 NOTE — Progress Notes (Signed)
PROGRESS NOTE    Melanie Best  ZOX:[id number] DOB: 27-Jan-1946 DOA: 11/03/2019 PCP: [name], MD   Chief Complaint  Patient presents with  . Shortness of Breath  . Fatigue    Brief Narrative:  Melanie Best is a 74 y.o. 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  She was admitted from August 4 to August 7 after being sent from her hematologist office with finding of new acute blood loss anemia secondary to gross hematuria from her known renal cell carcinoma and anticoagulation with Xarelto.  During admission she was transfused 2 units PRBC with her hemoglobin rising to 9.3 at discharge.  Her Xarelto was held at discharge given her poor prognosis there is no further plan for anticoagulation.  She was also seen by palliative care with plan to go home on palliative versus hospice, and was made DNR at that time.  She presented to the ED for fatigue and lack of appetite.  She lives with her husband and her son, her son has been admitted to the hospital due to Covid in the past week.  She reports that she has received her Covid vaccine series back in March, she endorses a chronic shortness of breath but states that it is not any different than how she usually feels.  She has been on her usual 2 L of oxygen without needing additional oxygen.  With regards to hospice she is unsure of details, but states that a hospital bed was brought out to her home.  Review of chart also shows that she received palliative radiation on August 9.  At discharge she was prescribed morphine and fentanyl patches for air hunger but states she does not take this because she does not like the way it makes her feel.  Work-up in the ED showed vital signs with slightly soft blood pressures in 90s over 40s that are lower compared to her baseline,  stable hemoglobin since discharge at 8.4, and CMP showing increasing creatinine from 1.4 to 2.14 since discharge.  She was also found to be incidentally Covid positive.  She was admitted for management of AKI.  Subsequently developed respiratory failure as well.  Assessment & Plan:   Acute on Chronic Hypoxic Respiratory Failure 2/2 COVID 19 Pneumonia/concern for healthcare associated pneumonia This is a breakthrough infection.  Patient previously vaccinated.  At baseline she is on 2 L of oxygen.    CT chest with patchy bilateral basilar opacities concerning for multifocal pneumonia with small basilar effusions as well as enlarging L perihilar and lower lobe process concerning for worsening of disease in chest/disease recurrence of uncertain significance in current setting of pneumonia (see report) She was given RegenCov on 8/17. Patient was initially requiring 6 L of oxygen.  She was successfully weaned down to 3 L of oxygen saturating in the mid 90s. However she continues to feel dyspneic.  She is noted to have wheezing on examination.  Started on Combivent inhaler yesterday.  She remains on steroids.  Consider repeating chest x-ray tomorrow morning.   Continue with Remdesivir: Today will be the fourth day.   She was also noted to have significantly elevated procalcitonin level and was started on vancomycin cefepime and azithromycin.  Procalcitonin trended from 6.86-12.31 and subsequently to 8.30.  Cultures negative so far.  MRSA PCR is positive.  Continue current antibiotics for now.  Will  determine duration based on chest x-ray tomorrow morning as well as her clinical status. Not a candidate for Actemra or baricitinib due to elevated procalcitonin. CRP has improved to 7.3.  D-dimer improved to 2.73.   Incentive spirometry, prone positioning any, mobilization as much as possible.  COVID-19 Labs  Recent Labs    11/05/19 0555 11/07/19 0500  DDIMER 3.80* 2.73*  FERRITIN 2,052*  --   CRP  23.2* 7.3*    AKI on CKD IIIb Baseline creatinine 1.4.  Presented with creatinine of 2.14.  Was gently hydrated.  Renal function back to baseline.  Renal function remains stable.  Monitor urine output.  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  There was a discussion of IVC filter placement at the last hospitalization, but the risk of filter was thought to exceed benefit and the recommendation was for stopping anticoagulation and only recommending filter if recurrence of thrombotic disease. Lower extremity Doppler study done during this hospitalization shows indeterminate age DVT in the left leg.  This is thought to be an old DVT based on discussion with vascular surgery.  No anticoagulation at this time due to risk of bleeding.  Currently on prophylactic doses of subcutaneous heparin.  Renal Cell Carcinoma  Tumor Thrombus Per discharge summary from last admission, patient went home on palliative care/hospice. Received palliative radiation on 8/9. She's currently under the care [facility]. Palliative care has been consulted to assist with goals of care. Plan is to continue current treatment without any escalation.  History of COPD on home oxygen at 2 L/min Stable.  Continue inhalers.  Essential hypertension Diltiazem was held at admission due to borderline low blood pressures.  This was resumed yesterday at low-dose.  Blood pressure stable.  Continue to monitor.    GERD Continue PPI  Macrocytic Anemia Decrease in hemoglobin noted.  No evidence of overt blood loss.  Transfuse if it drops below 7.  Anxiety/depression Continue mirtazapine  Constipation Complains of left lower abdominal pain.  Abdomen is benign.  Likely constipated.  Has not had a BM in several days.  Laxatives.  DVT prophylaxis: Subcutaneous Code  Status: DNR Family Communication: Discussed with patient's son. Disposition: Discussed with patient's son and transition of care.  Plan is for the patient to go back home with family when she is medically stable.    Status is: Inpatient  Remains inpatient appropriate because:IV treatments appropriate due to intensity of illness or inability to take PO and Inpatient level of care appropriate due to severity of illness   Dispo:  Patient From: Home  Planned Disposition: To be determined  Expected discharge date: 11/10/19  Medically stable for discharge: No      Consultants:   Palliative Care  Oncology  Procedures:   none  Antimicrobials:  Anti-infectives (From admission, onward)   Start     Dose/Rate Route Frequency Ordered Stop   11/05/19 1430  vancomycin (VANCOREADY) IVPB 500 mg/100 mL  Status:  Discontinued        500 mg 100 mL/hr over 60 Minutes Intravenous Every 24 hours 11/04/19 1241 11/05/19 1203   11/05/19 1430  vancomycin (VANCOREADY) IVPB 750 mg/150 mL        750 mg 150 mL/hr over 60 Minutes Intravenous Every 24 hours 11/05/19 1203     11/05/19 1000  remdesivir 100 mg in  sodium chloride 0.9 % 100 mL IVPB       "Followed by" Linked Group Details   100 mg 200 mL/hr over 30 Minutes Intravenous Daily 11/04/19 1239 11/09/19 0959   11/04/19 1500  remdesivir 200 mg in sodium chloride 0.9% 250 mL IVPB       "Followed by" Linked Group Details   200 mg 580 mL/hr over 30 Minutes Intravenous Once 11/04/19 1239 11/04/19 1449   11/04/19 1330  vancomycin (VANCOCIN) IVPB 1000 mg/200 mL premix        1,000 mg 200 mL/hr over 60 Minutes Intravenous  Once 11/04/19 1239 11/04/19 1428   11/04/19 1300  ceFEPIme (MAXIPIME) 2 g in sodium chloride 0.9 % 100 mL IVPB        2 g 200 mL/hr over 30 Minutes Intravenous Daily 11/04/19 1239     11/04/19 1230  cefTRIAXone (ROCEPHIN) 2 g in sodium chloride 0.9 % 100 mL IVPB  Status:  Discontinued        2 g 200 mL/hr over 30 Minutes  Intravenous Every 24 hours 11/04/19 1227 11/04/19 1229   11/04/19 1230  azithromycin (ZITHROMAX) tablet 500 mg        500 mg Oral Daily 11/04/19 1227 11/09/19 0959     Subjective: Patient complains of difficulty breathing.  She is wheezing.  Also mentions left-sided lower abdominal pain.  Has not had a bowel movement in several days.  No nausea vomiting.  Objective: Vitals:   11/06/19 1401 11/06/19 2008 11/07/19 0411 11/07/19 0440  BP: 107/61 (!) 152/63 114/60   Pulse: 80 97 78   Resp: 18 18 17    Temp: 97.8 F (36.6 C) 98.4 F (36.9 C) 98.4 F (36.9 C)   TempSrc: Oral     SpO2: 100% 95% 95%   Weight:    55 kg  Height:        Intake/Output Summary (Last 24 hours) at 11/07/2019 1232 Last data filed at 11/07/2019 0849 Gross per 24 hour  Intake 1030.84 ml  Output 1800 ml  Net -769.16 ml   Filed Weights   11/05/19 0428 11/06/19 0500 11/07/19 0440  Weight: 58.9 kg 55.1 kg 55 kg    Examination:  General appearance: Awake alert.  In no distress Resp: Tachypnea noted.  No use of accessory muscles.  End expiratory wheezing heard bilaterally.  Few crackles at the bases. Cardio: S1-S2 is normal regular.  No S3-S4.  No rubs murmurs or bruit GI: Abdomen is soft.  Mildly tender in the left lower quadrant without any rebound rigidity or guarding.  No masses organomegaly. Extremities: No edema.  Full range of motion of lower extremities. Neurologic: No focal neurological deficits.     Data Reviewed: I have personally reviewed following labs and imaging studies  CBC: Recent Labs  Lab 11/03/19 1624 11/04/19 0445 11/05/19 0555 11/06/19 0455 11/07/19 0500  WBC 12.0* 11.6* 11.4* 10.0 9.6  NEUTROABS 10.4*  --  10.6*  --   --   HGB 8.4* 8.2* 8.3* 8.0* 7.7*  HCT 28.7* 28.2* 28.2* 27.4* 26.5*  MCV 101.8* 103.3* 100.7* 99.6 100.0  PLT 128* 130* 122* 120* 109*    Basic Metabolic Panel: Recent Labs  Lab 11/03/19 1624 11/04/19 0445 11/05/19 0555 11/06/19 0455 11/07/19 0500    NA 136 140 138 138 140  K 4.9 4.6 4.5 4.4 4.0  CL 102 108 106 106 107  CO2 21* 18* 20* 22 23  GLUCOSE 82 71 168* 136* 193*  BUN 39* 36* 35*  42* 49*  CREATININE 2.14* 1.69* 1.45* 1.44* 1.44*  CALCIUM 7.7* 7.1* 7.5* 7.7* 8.0*  MG  --   --  1.9  --   --   PHOS  --   --  4.1  --   --     GFR: Estimated Creatinine Clearance: 30.7 mL/min (A) (by C-G formula based on SCr of 1.44 mg/dL (H)).  Liver Function Tests: Recent Labs  Lab 11/03/19 1624 11/05/19 0555 11/06/19 0455 11/07/19 0500  AST 75* 71* 51* 44*  ALT 45* 45* 43 40  ALKPHOS 134* 337* 264* 232*  BILITOT 1.2 0.6 0.6 0.4  PROT 5.6* 5.4* 5.3* 5.0*  ALBUMIN 2.6* 2.4* 2.4* 2.4*    CBG: Recent Labs  Lab 11/04/19 0843 11/04/19 0927 11/04/19 1305  GLUCAP 66* 94 103*     Recent Results (from the past 240 hour(s))  SARS Coronavirus 2 by RT PCR (hospital order, performed in [facility] hospital lab) Nasopharyngeal Nasopharyngeal Swab     Status: Abnormal   Collection Time: 11/03/19  4:40 PM   Specimen: Nasopharyngeal Swab  Result Value Ref Range Status   SARS Coronavirus 2 POSITIVE (A) NEGATIVE Final    Comment: RESULT CALLED TO, READ BACK BY AND VERIFIED WITH: C.Rosine Abe [id number] @1859  BY V.WILKINS (NOTE) SARS-CoV-2 target nucleic acids are DETECTED  SARS-CoV-2 RNA is generally detectable in upper respiratory specimens  during the acute phase of infection.  Positive results are indicative  of the presence of the identified virus, but do not rule out bacterial infection or co-infection with other pathogens not detected by the test.  Clinical correlation with patient history and  other diagnostic information is necessary to determine patient infection status.  The expected result is negative.  Fact Sheet for Patients:   StrictlyIdeas.no   Fact Sheet for Healthcare Providers:   BankingDealers.co.za    This test is not yet approved or cleared by the [place] FDA  and  has been authorized for detection and/or diagnosis of SARS-CoV-2 by FDA under an Emergency Use Authorization (EUA).  This EUA will remain in effect (meaning th is test can be used) for the duration of  the COVID-19 declaration under Section 564(b)(1) of the Act, 21 U.S.C. section 360-bbb-3(b)(1), unless the authorization is terminated or revoked sooner.  Performed at [facility], [place] [address] [place], [place] [address]   Culture, blood (routine x 2) Call MD if unable to obtain prior to antibiotics being given     Status: None (Preliminary result)   Collection Time: 11/04/19  1:10 PM   Specimen: Left Antecubital; Blood  Result Value Ref Range Status   Specimen Description   Final    LEFT ANTECUBITAL Performed at [facility] [address], [place], [place] [address]    Special Requests   Final    BOTTLES DRAWN AEROBIC AND ANAEROBIC Blood Culture results may not be optimal due to an excessive volume of blood received in culture bottles Performed at [facility] [address], [place], [place] [address]    Culture   Final    NO GROWTH 3 DAYS Performed at [facility], [place] [address] [place],  [address]    Report Status PENDING  Incomplete  Culture, blood (routine x 2) Call MD if unable to obtain prior to antibiotics being given     Status: None (Preliminary result)   Collection Time: 11/04/19  1:20 PM   Specimen: Right Antecubital; Blood  Result Value Ref  Range Status   Specimen Description   Final    RIGHT ANTECUBITAL Performed at [facility] [address] [place], [place] [address]    Special Requests   Final    BOTTLES DRAWN AEROBIC AND ANAEROBIC Blood Culture adequate volume Performed at [facility] [address], [place], [place] [address]    Culture   Final    NO GROWTH 3 DAYS Performed at [facility], [place] [address], [place], [place]  [address]    Report Status PENDING  Incomplete  MRSA PCR Screening     Status: Abnormal   Collection Time: 11/05/19 12:55 PM   Specimen: Nasal Mucosa; Nasopharyngeal  Result Value Ref Range Status   MRSA by PCR POSITIVE (A) NEGATIVE Final    Comment:        The GeneXpert MRSA Assay (FDA approved for NASAL specimens only), is one component of a comprehensive MRSA colonization surveillance program. It is not intended to diagnose MRSA infection nor to guide or monitor treatment for MRSA infections. RESULT CALLED TO, READ BACK BY AND VERIFIED WITH: H PARISH AT 9147 ON 11/05/2019 BY MOSLEY,J  Performed at [facility], [place] [address] [place], [place] [address]          Radiology Studies: No results found.   Scheduled Meds: . azithromycin  500 mg Oral Daily  . brinzolamide  1 drop Both Eyes TID  . Chlorhexidine Gluconate Cloth  6 each Topical Daily  . diltiazem  120 mg Oral Daily  . heparin injection (subcutaneous)  5,000 Units Subcutaneous Q12H  . Ipratropium-Albuterol  4 puff Inhalation QID  . lactose free nutrition  237 mL Oral TID WC  . latanoprost  1 drop Both Eyes QHS  . methylPREDNISolone (SOLU-MEDROL) injection  40 mg Intravenous Q12H  . mirtazapine  7.5 mg Oral Daily  . multivitamin with minerals  1 tablet Oral Daily  . mupirocin ointment  1 application Nasal BID  . pantoprazole  80 mg Oral Q1200  . polyethylene glycol  17 g Oral BID  . senna  2 tablet Oral BID  . sodium chloride flush  10-40 mL Intracatheter Q12H   Continuous Infusions: . sodium chloride 10 mL/hr at 11/06/19 0600  . ceFEPime (MAXIPIME) IV 2 g (11/06/19 1432)  . famotidine (PEPCID) IV    . remdesivir 100 mg in NS 100 mL 100 mg (11/07/19 0945)  . vancomycin 750 mg (11/06/19 1518)     LOS: 3 days    Bonnielee Haff, MD [facility]   To contact the attending provider between 7A-7P or the covering provider during after hours 7P-7A, please log into the web site  [email or web address] and access using universal [place] password for that web site. If you do not have the password, please call the hospital operator.  11/07/2019, 12:32 PM

## 2019-11-07 NOTE — Evaluation (Signed)
Occupational Therapy Evaluation Patient Details Name: Melanie Best MRN: [id number] DOB: 01-17-1946 Today's Date: 11/07/2019    History of Present Illness Melanie Best is a 74 y.o. female Melanie Best s a 74 y.o. 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 Patient found to be COVID positive.   Clinical Impression   [name] is a 74 year old woman who presents with generalized weakness, decreased activity tolerance, and impaired balance resulting in decreased ability to perform her baseline ADLs and mobility. Patient required mod assist for sit to stand and transfer to recliner, mod assist for lower body dressing and bathing and max assist for toileting. Patient set up for UB ADLs in seated position. Patient on 2L [place] with sat 96% in supine. With activity patient reported dyspnea and increased respiratory work load. O2 sat 89-90% with 3L needed to recover and then resumed at 2 liters. Patient performing grooming task seated in recliner including washing her face and applying lotion to her skin. Patient reports fatigue with activity. Patient will benefit from skilled OT services to improve deficits and learn compensatory strategies as needed in order to return home at discharge.    Follow Up Recommendations  Home health OT    Equipment Recommendations  3 in 1 bedside commode    Recommendations for Other Services       Precautions / Restrictions Precautions Precautions: Fall Restrictions Weight Bearing Restrictions: No      Mobility Bed Mobility Overal bed mobility: Needs Assistance Bed Mobility: Supine to Sit     Supine to sit: Mod assist     General bed mobility comments: mod assist for guiding lower extremities and trunk lift off.  Transfers Overall transfer level: Needs assistance   Transfers: Sit to/from Stand;Stand Pivot Transfers Sit to Stand:  Mod assist;From elevated surface Stand pivot transfers: Mod assist            Balance Overall balance assessment: Needs assistance Sitting-balance support: Feet supported;No upper extremity supported Sitting balance-Leahy Scale: Fair     Standing balance support: Bilateral upper extremity supported Standing balance-Leahy Scale: Poor                             ADL either performed or assessed with clinical judgement   ADL Overall ADL's : Needs assistance/impaired Eating/Feeding: Set up   Grooming: Set up;Wash/dry face;Sitting   Upper Body Bathing: Set up;Sitting   Lower Body Bathing: Set up;Minimal assistance;Sitting/lateral leans   Upper Body Dressing : Set up;Sitting   Lower Body Dressing: Moderate assistance;Set up;Sit to/from stand Lower Body Dressing Details (indicate cue type and reason): Patient able to donn socks in seated position - fatigued quickly. Needs assistance for pulling clothing up over hips Toilet Transfer: Stand-pivot;BSC;Moderate assistance   Toileting- Clothing Manipulation and Hygiene: Maximal assistance;Sit to/from stand               Vision   Vision Assessment?: No apparent visual deficits     Perception     Praxis      Pertinent Vitals/Pain Pain Assessment: No/denies pain     Hand Dominance Right   Extremity/Trunk Assessment Upper Extremity Assessment Upper Extremity Assessment: Generalized weakness   Lower Extremity Assessment Lower Extremity Assessment: Defer to PT evaluation   Cervical / Trunk Assessment Cervical / Trunk Assessment: Normal   Communication Communication Communication:  No difficulties   Cognition Arousal/Alertness: Awake/alert Behavior During Therapy: WFL for tasks assessed/performed Overall Cognitive Status: Within Functional Limits for tasks assessed                                     General Comments       Exercises     Shoulder Instructions      Home Living  Family/patient expects to be discharged to:: Private residence Living Arrangements: Spouse/significant other;Children Available Help at Discharge: Family;Available PRN/intermittently   Home Access: Stairs to enter Entrance Stairs-Number of Steps: 5 steps Entrance Stairs-Rails: None Home Layout: One level     Bathroom Shower/Tub: Teacher, early years/pre: Handicapped height Bathroom Accessibility: Yes   Home Equipment: Clinical cytogeneticist - 2 wheels          Prior Functioning/Environment Level of Independence: Independent with assistive device(s)        Comments: Reports ambulating with walker at home and independent with ADLs. Reports husband has cancer and cannot be her caregiver.        OT Problem List: Decreased strength;Decreased activity tolerance;Impaired balance (sitting and/or standing);Pain;Decreased knowledge of use of DME or AE      OT Treatment/Interventions: Self-care/ADL training;Therapeutic exercise;DME and/or AE instruction;Energy conservation;Therapeutic activities;Balance training;Patient/family education    OT Goals(Current goals can be found in the care plan section) Acute Rehab OT Goals Patient Stated Goal: to get stronger OT Goal Formulation: With patient Time For Goal Achievement: 11/21/19 Potential to Achieve Goals: Good  OT Frequency: Min 2X/week   Barriers to D/C: Decreased caregiver support          Co-evaluation              AM-PAC OT "6 Clicks" Daily Activity     Outcome Measure Help from another person eating meals?: None Help from another person taking care of personal grooming?: A Little Help from another person toileting, which includes using toliet, bedpan, or urinal?: A Lot Help from another person bathing (including washing, rinsing, drying)?: A Lot Help from another person to put on and taking off regular upper body clothing?: A Little Help from another person to put on and taking off regular lower body  clothing?: A Lot 6 Click Score: 16   End of Session Equipment Utilized During Treatment: Oxygen Nurse Communication: Mobility status  Activity Tolerance: Patient limited by fatigue Patient left: in chair;with call bell/phone within reach;with chair alarm set  OT Visit Diagnosis: Unsteadiness on feet (R26.81);Muscle weakness (generalized) (M62.81)                Time: 1340-1420 OT Time Calculation (min): 40 min Charges:  OT General Charges $OT Visit: 1 Visit OT Evaluation $OT Eval Moderate Complexity: 1 Mod OT Treatments $Self Care/Home Management : 8-22 mins  Matsue Strom, OTR/L [place] [phone] Pager: [place] 11/07/2019, 4:56 PM

## 2019-11-07 NOTE — Evaluation (Signed)
Physical Therapy Evaluation Patient Details Name: Melanie Best MRN: [id number] DOB: 1945/12/02 Today's Date: 11/07/2019   History of Present Illness  Melanie Best is a 74 y.o. female Melanie Best s a 74 y.o. 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 Patient found to be COVID positive.  Clinical Impression  Pt with great weakness from COVID and immobility. Pt to benefit from PT and continued HHPT to increase strength and activity tolerance. Recommend caregivers  to begin using BSC and hopefully increase tolerance to walk into the bathroom soon. Pt was very fatigues today with audible congestion and tightness in chest and SOB with very little activity.     Follow Up Recommendations Home health PT    Equipment Recommendations  None recommended by PT    Recommendations for Other Services       Precautions / Restrictions Precautions Precautions: Fall Restrictions Weight Bearing Restrictions: No      Mobility  Bed Mobility Overal bed mobility: Needs Assistance Bed Mobility: Supine to Sit;Sit to Supine     Supine to sit: Mod assist Sit to supine: Min assist   General bed mobility comments: MinA assist for guiding lower extremities and trunk up on the bed  Transfers Overall transfer level: Needs assistance   Transfers: Sit to/from Stand;Stand Pivot Transfers Sit to Stand: Mod assist;From elevated surface Stand pivot transfers: Mod assist       General transfer comment: ModA to power up to standing due to weakness.  Ambulation/Gait Ambulation/Gait assistance: Min guard Gait Distance (Feet): 10 Feet Assistive device: Rolling walker (2 wheeled) Gait Pattern/deviations: Step-through pattern     General Gait Details: small guarded steps due to weakness and SOB with very little activity  Stairs            Wheelchair Mobility    Modified Rankin  (Stroke Patients Only)       Balance Overall balance assessment: Needs assistance Sitting-balance support: Feet supported;No upper extremity supported Sitting balance-Leahy Scale: Fair     Standing balance support: Bilateral upper extremity supported Standing balance-Leahy Scale: Poor                               Pertinent Vitals/Pain Pain Assessment: No/denies pain    Home Living Family/patient expects to be discharged to:: Private residence Living Arrangements: Spouse/significant other;Children (lives with son and husband who are also COVID positive) Available Help at Discharge: Family;Available PRN/intermittently Type of Home: House Home Access: Stairs to enter Entrance Stairs-Rails: None Entrance Stairs-Number of Steps: 5 steps Home Layout: One level Home Equipment: Clinical cytogeneticist - 4 wheels      Prior Function Level of Independence: Independent with assistive device(s)         Comments: Reports ambulating with walker, but mostly no AD at home, uses the 4 wheeled RW when she has to go out. Reports husband has cancer and cannot be her caregiver, and son works but just DC from [employer] with [place]        Extremity/Trunk Assessment   Upper Extremity Assessment Upper Extremity Assessment: Generalized weakness    Lower Extremity Assessment Lower Extremity Assessment: Generalized weakness    Cervical / Trunk Assessment Cervical / Trunk Assessment: Normal  Communication   Communication: No difficulties  Cognition Arousal/Alertness: Awake/alert Behavior During Therapy: WFL for tasks  assessed/performed Overall Cognitive Status: Within Functional Limits for tasks assessed                                        General Comments      Exercises     Assessment/Plan    PT Assessment Patient needs continued PT services  PT Problem List Decreased strength;Decreased mobility;Decreased activity tolerance        PT Treatment Interventions Gait training;Functional mobility training;Therapeutic activities;Therapeutic exercise;Patient/family education    PT Goals (Current goals can be found in the Care Plan section)  Acute Rehab PT Goals Patient Stated Goal: to get stronger, I want to start using a BSC and not this purewick PT Goal Formulation: With patient Time For Goal Achievement: 11/20/19 Potential to Achieve Goals: Good    Frequency Min 3X/week   Barriers to discharge        Co-evaluation               AM-PAC PT "6 Clicks" Mobility  Outcome Measure Help needed turning from your back to your side while in a flat bed without using bedrails?: A Little Help needed moving from lying on your back to sitting on the side of a flat bed without using bedrails?: A Little Help needed moving to and from a bed to a chair (including a wheelchair)?: A Little Help needed standing up from a chair using your arms (e.g., wheelchair or bedside chair)?: A Little Help needed to walk in hospital room?: A Little Help needed climbing 3-5 steps with a railing? : A Little 6 Click Score: 18    End of Session   Activity Tolerance: Other (comment) (limited session due to fatigued a lot, first time up in chair most of day and first time walking with great SOB .) Patient left: in bed Nurse Communication: Mobility status PT Visit Diagnosis: Other abnormalities of gait and mobility (R26.89);Muscle weakness (generalized) (M62.81)    Time: 1700-1737 PT Time Calculation (min) (ACUTE ONLY): 37 min   Charges:   PT Evaluation $PT Eval Low Complexity: 1 Low PT Treatments $Gait Training: 8-22 mins        Rae Sutcliffe, PT, MPT Acute Rehabilitation Services Office: [phone] Pager: [phone] 11/07/2019   Clide Dales 11/07/2019, 6:10 PM

## 2019-11-08 ENCOUNTER — Inpatient Hospital Stay (HOSPITAL_COMMUNITY)

## 2019-11-08 LAB — CBC
HCT: 29.9 % — ABNORMAL LOW (ref 36.0–46.0)
Hemoglobin: 8.6 g/dL — ABNORMAL LOW (ref 12.0–15.0)
MCH: 29.6 pg (ref 26.0–34.0)
MCHC: 28.8 g/dL — ABNORMAL LOW (ref 30.0–36.0)
MCV: 102.7 fL — ABNORMAL HIGH (ref 80.0–100.0)
Platelets: 118 10*3/uL — ABNORMAL LOW (ref 150–400)
RBC: 2.91 MIL/uL — ABNORMAL LOW (ref 3.87–5.11)
RDW: 18.1 % — ABNORMAL HIGH (ref 11.5–15.5)
WBC: 13.8 10*3/uL — ABNORMAL HIGH (ref 4.0–10.5)
nRBC: 0.4 % — ABNORMAL HIGH (ref 0.0–0.2)

## 2019-11-08 LAB — COMPREHENSIVE METABOLIC PANEL
ALT: 48 U/L — ABNORMAL HIGH (ref 0–44)
AST: 58 U/L — ABNORMAL HIGH (ref 15–41)
Albumin: 2.5 g/dL — ABNORMAL LOW (ref 3.5–5.0)
Alkaline Phosphatase: 228 U/L — ABNORMAL HIGH (ref 38–126)
Anion gap: 14 (ref 5–15)
BUN: 46 mg/dL — ABNORMAL HIGH (ref 8–23)
CO2: 19 mmol/L — ABNORMAL LOW (ref 22–32)
Calcium: 7.9 mg/dL — ABNORMAL LOW (ref 8.9–10.3)
Chloride: 109 mmol/L (ref 98–111)
Creatinine, Ser: 1.28 mg/dL — ABNORMAL HIGH (ref 0.44–1.00)
GFR calc Af Amer: 48 mL/min — ABNORMAL LOW (ref >60)
GFR calc non Af Amer: 42 mL/min — ABNORMAL LOW (ref >60)
Glucose, Bld: 153 mg/dL — ABNORMAL HIGH (ref 70–99)
Potassium: 3.7 mmol/L (ref 3.5–5.1)
Sodium: 142 mmol/L (ref 135–145)
Total Bilirubin: 0.3 mg/dL (ref 0.3–1.2)
Total Protein: 5.3 g/dL — ABNORMAL LOW (ref 6.5–8.1)

## 2019-11-08 LAB — PROCALCITONIN: Procalcitonin: 2.37 ng/mL

## 2019-11-08 MED ORDER — FLEET ENEMA 7-19 GM/118ML RE ENEM: 1.0000 | ENEMA | Freq: Every day | RECTAL | Status: DC | PRN

## 2019-11-08 MED ORDER — BISACODYL 10 MG RE SUPP
10.0000 mg | Freq: Once | RECTAL | Status: AC
  Administered 2019-11-08: 10 mg via RECTAL
  Filled 2019-11-08: qty 1

## 2019-11-08 MED ORDER — MOMETASONE FURO-FORMOTEROL FUM 200-5 MCG/ACT IN AERO
2.0000 | INHALATION_SPRAY | Freq: Two times a day (BID) | RESPIRATORY_TRACT | Status: DC
  Administered 2019-11-08 – 2019-11-11 (×7): 2 via RESPIRATORY_TRACT
  Filled 2019-11-08: qty 8.8

## 2019-11-08 NOTE — Progress Notes (Addendum)
PROGRESS NOTE    Melanie Best  JOA:[id number] DOB: Aug 21, 1945 DOA: 11/03/2019 PCP: [name], MD   Chief Complaint  Patient presents with  . Shortness of Breath  . Fatigue    Brief Narrative:  Melanie Best is a 74 y.o. 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  She was admitted from August 4 to August 7 after being sent from her hematologist office with finding of new acute blood loss anemia secondary to gross hematuria from her known renal cell carcinoma and anticoagulation with Xarelto.  During admission she was transfused 2 units PRBC with her hemoglobin rising to 9.3 at discharge.  Her Xarelto was held at discharge given her poor prognosis there is no further plan for anticoagulation.  She was also seen by palliative care with plan to go home on palliative versus hospice, and was made DNR at that time.  She presented to the ED for fatigue and lack of appetite.  She lives with her husband and her son, her son has been admitted to the hospital due to Covid in the past week.  She reports that she has received her Covid vaccine series back in March, she endorses a chronic shortness of breath but states that it is not any different than how she usually feels.  She has been on her usual 2 L of oxygen without needing additional oxygen.  With regards to hospice she is unsure of details, but states that a hospital bed was brought out to her home.  Review of chart also shows that she received palliative radiation on August 9.  At discharge she was prescribed morphine and fentanyl patches for air hunger but states she does not take this because she does not like the way it makes her feel.  Work-up in the ED showed vital signs with slightly soft blood pressures in 90s over 40s that are lower compared to her baseline,  stable hemoglobin since discharge at 8.4, and CMP showing increasing creatinine from 1.4 to 2.14 since discharge.  She was also found to be incidentally Covid positive.  She was admitted for management of AKI.  Subsequently developed respiratory failure as well.  Assessment & Plan:   Acute on Chronic Hypoxic Respiratory Failure 2/2 COVID 19 Pneumonia/concern for healthcare associated pneumonia This is a breakthrough infection.  Patient previously vaccinated.  At baseline she is on 2 L of oxygen.    CT chest with patchy bilateral basilar opacities concerning for multifocal pneumonia with small basilar effusions as well as enlarging L perihilar and lower lobe process concerning for worsening of disease in chest/disease recurrence of uncertain significance in current setting of pneumonia (see report) She was given RegenCov on 8/17. Patient was initially requiring 6 L of oxygen.  Over the past 2 to 3 days she has been weaned down to 2lpm.  However she continues to have wheezing.  She was started on Combivent.  Chest x-ray repeated today which does not show any new changes.  Nonspecific opacity noted in the left lower lung which was seen previously as well and could be related to her underlying cancer.  Will not pursue this further as she is not a candidate for any treatments. Wheezing has improved some but still persists.  Patient not noted to be on any inhaled steroids which will be initiated today. Today will be her last day of  Remdesivir.  Continue steroids. She also remains on antibacterials.  Procalcitonin was significantly elevated with a peak of 12.31.  Improved to 2.37 this morning.  Elevated WBC most likely due to steroids.  Plan for a 7-day course.  We will recheck procalcitonin tomorrow. Not a candidate for Actemra or baricitinib due to elevated procalcitonin. Inflammatory markers had improved. Incentive spirometry, prone positioning any, mobilization as much as possible.  AKI on CKD  IIIb Baseline creatinine 1.4.  Presented with creatinine of 2.14.  Was gently hydrated.  Renal function back to baseline.  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  There was a discussion of IVC filter placement at the last hospitalization, but the risk of filter was thought to exceed benefit and the recommendation was for stopping anticoagulation and only recommending filter if recurrence of thrombotic disease. Lower extremity Doppler study done during this hospitalization shows indeterminate age DVT in the left leg.  This is thought to be an old DVT based on discussion with vascular surgery.  No anticoagulation at this time due to high risk of bleeding.  Currently on prophylactic doses of subcutaneous heparin.  Renal Cell Carcinoma  Tumor Thrombus Per discharge summary from last admission, patient went home on palliative care/hospice. Received palliative radiation on 8/9. She's currently under the care of [facility]. Palliative care has been consulted to assist with goals of care. Plan is to continue current treatment without any escalation.  History of COPD on home oxygen at 2 L/min Stable.  Continue Combivent.  Dulera added today.  Essential hypertension Pressure is reasonably well controlled.  Continue diltiazem.     GERD Continue PPI  Macrocytic Anemia Hemoglobin stable for the most part.  No evidence of overt bleeding.  Transfuse if it drops below 7.  Anxiety/depression Continue mirtazapine  Constipation Complains of left lower abdominal pain.  Still no bowel movement.  We will add suppository today.  Enema as needed.  Abdomen remains benign.  Continue laxatives.  DVT prophylaxis: Subcutaneous Code Status: DNR Family Communication: We will update family later today. Disposition: Discussed with patient's son  and transition of care.  Plan is for the patient to go back home with family when she is medically stable.    Status is: Inpatient  Remains inpatient appropriate because:IV treatments appropriate due to intensity of illness or inability to take PO and Inpatient level of care appropriate due to severity of illness   Dispo:  Patient From: Home  Planned Disposition: Home  Expected discharge date: 11/10/19  Medically stable for discharge: No      Consultants:   Palliative Care  Oncology  Procedures:   none  Antimicrobials:  Anti-infectives (From admission, onward)   Start     Dose/Rate Route Frequency Ordered Stop   11/05/19 1430  vancomycin (VANCOREADY) IVPB 500 mg/100 mL  Status:  Discontinued        500 mg 100 mL/hr over 60 Minutes Intravenous Every 24 hours 11/04/19 1241 11/05/19 1203   11/05/19 1430  vancomycin (VANCOREADY) IVPB 750 mg/150 mL        750 mg 150 mL/hr over 60 Minutes Intravenous Every 24 hours 11/05/19 1203     11/05/19 1000  remdesivir 100 mg in sodium chloride 0.9 % 100 mL IVPB       "Followed by" Linked Group Details   100 mg  200 mL/hr over 30 Minutes Intravenous Daily 11/04/19 1239 11/08/19 0956   11/04/19 1500  remdesivir 200 mg in sodium chloride 0.9% 250 mL IVPB       "Followed by" Linked Group Details   200 mg 580 mL/hr over 30 Minutes Intravenous Once 11/04/19 1239 11/04/19 1449   11/04/19 1330  vancomycin (VANCOCIN) IVPB 1000 mg/200 mL premix        1,000 mg 200 mL/hr over 60 Minutes Intravenous  Once 11/04/19 1239 11/04/19 1428   11/04/19 1300  ceFEPIme (MAXIPIME) 2 g in sodium chloride 0.9 % 100 mL IVPB        2 g 200 mL/hr over 30 Minutes Intravenous Daily 11/04/19 1239     11/04/19 1230  cefTRIAXone (ROCEPHIN) 2 g in sodium chloride 0.9 % 100 mL IVPB  Status:  Discontinued        2 g 200 mL/hr over 30 Minutes Intravenous Every 24 hours 11/04/19 1227 11/04/19 1229   11/04/19 1230  azithromycin (ZITHROMAX) tablet 500 mg        500 mg  Oral Daily 11/04/19 1227 11/08/19 0913     Subjective: Continues to complain of constipation and left-sided abdominal pain.  No nausea vomiting.  Also complains of wheezing which is slightly better today but still persists.  Occasional dry cough.  Objective: Vitals:   11/07/19 1803 11/07/19 1952 11/08/19 0336 11/08/19 0503  BP:  131/60 138/74   Pulse:  83 91   Resp:  15 15   Temp:  97.6 F (36.4 C) (!) 97.5 F (36.4 C)   TempSrc:  Oral Oral   SpO2: 95% 100% 100%   Weight:    57.2 kg  Height:        Intake/Output Summary (Last 24 hours) at 11/08/2019 1056 Last data filed at 11/08/2019 0900 Gross per 24 hour  Intake 600 ml  Output 1725 ml  Net -1125 ml   Filed Weights   11/06/19 0500 11/07/19 0440 11/08/19 0503  Weight: 55.1 kg 55 kg 57.2 kg    Examination:  General appearance: Awake alert.  In no distress Resp: Tachypnea noted although less than yesterday.  Continues to have end expiratory wheezing bilaterally.  Crackles at the bases. Cardio: S1-S2 is normal regular.  No S3-S4.  No rubs murmurs or bruit GI: Abdomen is soft.  Tender in the left lower quadrant without any rebound rigidity or guarding.  No masses organomegaly.   Extremities: No edema.  Moving all her extremities Neurologic: No focal neurological deficits.     Data Reviewed: I have personally reviewed following labs and imaging studies  CBC: Recent Labs  Lab 11/03/19 1624 11/03/19 1624 11/04/19 0445 11/05/19 0555 11/06/19 0455 11/07/19 0500 11/08/19 0400  WBC 12.0*   < > 11.6* 11.4* 10.0 9.6 13.8*  NEUTROABS 10.4*  --   --  10.6*  --   --   --   HGB 8.4*   < > 8.2* 8.3* 8.0* 7.7* 8.6*  HCT 28.7*   < > 28.2* 28.2* 27.4* 26.5* 29.9*  MCV 101.8*   < > 103.3* 100.7* 99.6 100.0 102.7*  PLT 128*   < > 130* 122* 120* 109* 118*   < > = values in this interval not displayed.    Basic Metabolic Panel: Recent Labs  Lab 11/04/19 0445 11/05/19 0555 11/06/19 0455 11/07/19 0500 11/08/19 0400  NA 140  138 138 140 142  K 4.6 4.5 4.4 4.0 3.7  CL 108 106 106 107 109  CO2  18* 20* 22 23 19*  GLUCOSE 71 168* 136* 193* 153*  BUN 36* 35* 42* 49* 46*  CREATININE 1.69* 1.45* 1.44* 1.44* 1.28*  CALCIUM 7.1* 7.5* 7.7* 8.0* 7.9*  MG  --  1.9  --   --   --   PHOS  --  4.1  --   --   --     GFR: Estimated Creatinine Clearance: 35.9 mL/min (A) (by C-G formula based on SCr of 1.28 mg/dL (H)).  Liver Function Tests: Recent Labs  Lab 11/03/19 1624 11/05/19 0555 11/06/19 0455 11/07/19 0500 11/08/19 0400  AST 75* 71* 51* 44* 58*  ALT 45* 45* 43 40 48*  ALKPHOS 134* 337* 264* 232* 228*  BILITOT 1.2 0.6 0.6 0.4 0.3  PROT 5.6* 5.4* 5.3* 5.0* 5.3*  ALBUMIN 2.6* 2.4* 2.4* 2.4* 2.5*    CBG: Recent Labs  Lab 11/04/19 0843 11/04/19 0927 11/04/19 1305  GLUCAP 66* 94 103*     Recent Results (from the past 240 hour(s))  SARS Coronavirus 2 by RT PCR (hospital order, performed in [facility] hospital lab) Nasopharyngeal Nasopharyngeal Swab     Status: Abnormal   Collection Time: 11/03/19  4:40 PM   Specimen: Nasopharyngeal Swab  Result Value Ref Range Status   SARS Coronavirus 2 POSITIVE (A) NEGATIVE Final    Comment: RESULT CALLED TO, READ BACK BY AND VERIFIED WITH: C.Rosine Abe [id number] @1859  BY V.WILKINS (NOTE) SARS-CoV-2 target nucleic acids are DETECTED  SARS-CoV-2 RNA is generally detectable in upper respiratory specimens  during the acute phase of infection.  Positive results are indicative  of the presence of the identified virus, but do not rule out bacterial infection or co-infection with other pathogens not detected by the test.  Clinical correlation with patient history and  other diagnostic information is necessary to determine patient infection status.  The expected result is negative.  Fact Sheet for Patients:   StrictlyIdeas.no   Fact Sheet for Healthcare Providers:   BankingDealers.co.za    This test is not yet approved  or cleared by the [place] FDA and  has been authorized for detection and/or diagnosis of SARS-CoV-2 by FDA under an Emergency Use Authorization (EUA).  This EUA will remain in effect (meaning th is test can be used) for the duration of  the COVID-19 declaration under Section 564(b)(1) of the Act, 21 U.S.C. section 360-bbb-3(b)(1), unless the authorization is terminated or revoked sooner.  Performed at [facility], [place] [address] [place], [place] [address]   Culture, blood (routine x 2) Call MD if unable to obtain prior to antibiotics being given     Status: None (Preliminary result)   Collection Time: 11/04/19  1:10 PM   Specimen: Left Antecubital; Blood  Result Value Ref Range Status   Specimen Description   Final    LEFT ANTECUBITAL Performed at [facility] [address] [place], [place] [address]    Special Requests   Final    BOTTLES DRAWN AEROBIC AND ANAEROBIC Blood Culture results may not be optimal due to an excessive volume of blood received in culture bottles Performed at [facility] [address] [place], [place] [address]    Culture   Final    NO GROWTH 3 DAYS Performed at [facility], [place] [address], [place], [place] [address]    Report Status PENDING  Incomplete  Culture, blood (routine x 2) Call MD if unable to obtain prior to antibiotics being given  Status: None (Preliminary result)   Collection Time: 11/04/19  1:20 PM   Specimen: Right Antecubital; Blood  Result Value Ref Range Status   Specimen Description   Final    RIGHT ANTECUBITAL Performed at [facility] [address], [place], [place] [address]    Special Requests   Final    BOTTLES DRAWN AEROBIC AND ANAEROBIC Blood Culture adequate volume Performed at [facility] [address] [place], [place] [address]    Culture   Final    NO GROWTH 3 DAYS Performed at [place], [place] [address], [place], [place] [address]    Report Status PENDING  Incomplete  MRSA PCR Screening     Status: Abnormal   Collection Time: 11/05/19 12:55 PM   Specimen: Nasal Mucosa; Nasopharyngeal  Result Value Ref Range Status   MRSA by PCR POSITIVE (A) NEGATIVE Final    Comment:        The GeneXpert MRSA Assay (FDA approved for NASAL specimens only), is one component of a comprehensive MRSA colonization surveillance program. It is not intended to diagnose MRSA infection nor to guide or monitor treatment for MRSA infections. RESULT CALLED TO, READ BACK BY AND VERIFIED WITH: H PARISH AT 1607 ON 11/05/2019 BY MOSLEY,J  Performed at [facility], [place] [address], [place], [place] [address]          Radiology Studies: [facility] Chest [place] 1 View  Result Date: 11/08/2019 CLINICAL DATA:  74 year old female with history of pneumonia from COVID infection. EXAM: PORTABLE CHEST 1 VIEW COMPARISON:  Chest x-ray 11/03/2019. FINDINGS: Right internal jugular single-lumen porta cath with tip terminating in the distal superior vena cava. Lung volumes are increased with emphysematous changes. Some linear scarring is noted in the right upper lobe and left mid lung, similar to prior examinations. Focal left lower lobe opacity in the retrocardiac region somewhat mass-like in appearance. No airspace consolidation in the right lung. No pleural effusions. No evidence of pulmonary edema. No pneumothorax. Skin fold artifact over the mid to lower left hemithorax. Heart size is normal. Upper mediastinal contours are within normal limits. Aortic atherosclerosis. IMPRESSION: 1. Support apparatus, as above. 2. Focal mass-like opacity in the left lower lobe which may reflect pneumonia, although underlying neoplasm is not excluded. Correlation with prior chest CT is recommended. 3. Aortic atherosclerosis. 4. Emphysema. Electronically Signed   By: Vinnie Langton M.D.   On: 11/08/2019 07:31      Scheduled Meds: . bisacodyl  10 mg Rectal Once  . brinzolamide  1 drop Both Eyes TID  . Chlorhexidine Gluconate Cloth  6 each Topical Daily  . diltiazem  120 mg Oral Daily  . heparin injection (subcutaneous)  5,000 Units Subcutaneous Q12H  . Ipratropium-Albuterol  4 puff Inhalation QID  . lactose free nutrition  237 mL Oral TID WC  . latanoprost  1 drop Both Eyes QHS  . methylPREDNISolone (SOLU-MEDROL) injection  40 mg Intravenous Q12H  . mirtazapine  7.5 mg Oral Daily  . mometasone-formoterol  2 puff Inhalation BID  . multivitamin with minerals  1 tablet Oral Daily  . mupirocin ointment  1 application Nasal BID  . pantoprazole  80 mg Oral Q1200  . polyethylene glycol  17 g Oral BID  . senna  2 tablet Oral BID  . sodium chloride flush  10-40 mL Intracatheter Q12H   Continuous Infusions: . sodium chloride 10 mL/hr at 11/06/19 0600  . ceFEPime (MAXIPIME) IV 2 g (  11/07/19 1331)  . famotidine (PEPCID) IV    . vancomycin 750 mg (11/07/19 1603)     LOS: 4 days    Bonnielee Haff, MD [facility]   To contact the attending provider between 7A-7P or the covering provider during after hours 7P-7A, please log into the web site [email or web address] and access using universal [place] password for that web site. If you do not have the password, please call the hospital operator.  11/08/2019, 10:56 AM

## 2019-11-08 NOTE — Progress Notes (Signed)
[facility] [id number] - Manufacturing engineer [facility] Liaison Note 11/08/19   Melanie Best is a current hospice patient with [facility] with a terminal diagnosis of Renal Cell Carcinoma. Patient initiated call to EMS from the home for evaluation of increased SOB/pain/fatigue.  Patient was transported to [facility] then admitted on 11/04/19 for management of AKI. This is a related admission. Patient is a DNR.   In person visits restricted due to hospital Covid precautions.   Wheezing has improved some but still persists.  Patient not noted to be on any inhaled steroids which will be initiated today. Today will be her last day of Remdesivir.  Continue steroids. She also remains on antibacterials.  Procalcitonin was significantly elevated with a peak of 12.31.  Improved to 2.37 this morning.  Elevated WBC most likely due to steroids.  Plan for a 7-day course.  We will recheck procalcitonin tomorrow. Not a candidate for Actemra or baricitinib due to elevated procalcitonin. Inflammatory markers had improved. Incentive spirometry, prone positioning any, mobilization as much as possible.   VS: 97.8, 127/62, 81, 18, 100% 3Lnc Abnormal Labs:  CO2 22 - 32 mmol/L 19 Low   Glucose, Bld 70 - 99 mg/dL 153 High   Comment: Glucose reference range applies only to samples taken after fasting for at least 8 hours. BUN 8 - 23 mg/dL 46 High   Creatinine, Ser 0.44 - 1.00 mg/dL 1.28 High   Calcium 8.9 - 10.3 mg/dL 7.9 Low   Total Protein 6.5 - 8.1 g/dL 5.3 Low   Albumin 3.5 - 5.0 g/dL 2.5 Low   AST 15 - 41 U/L 58 High   ALT 0 - 44 U/L 48 High   Alkaline Phosphatase 38 - 126 U/L 228 High   Total Bilirubin 0.3 - 1.2 mg/dL 0.3  GFR calc non Af Amer >60 mL/min 42 Low   GFR calc Af Amer >60 mL/min 48 Low   WBC 4.0 - 10.5 K/uL 13.8 High   RBC 3.87 - 5.11 MIL/uL 2.91 Low   Hemoglobin 12.0 - 15.0 g/dL 8.6 Low   HCT 36 - 46 % 29.9 Low   MCV 80.0 - 100.0 fL 102.7 High   MCH 26.0 - 34.0 pg 29.6  MCHC 30.0 - 36.0 g/dL 28.8 Low    RDW 11.5 - 15.5 % 18.1 High   Platelets 150 - 400 K/uL 118 Low    Diagnostics: no new results   IV/PRN medications: Maxipime 2g in NS 18ml IVPB QD, Vancomycin IVPB 7500mg /154ml QD PRNs: Oxycodone 5mg  PO PRN    Problem list from Epic: Acute on Chronic Hypoxic Respiratory Failure 2/2 COVID 19 Pneumonia/concern for healthcare associated pneumonia This is a breakthrough infection.  Patient previously vaccinated.  At baseline she is on 2 L of oxygen.    CT chest with patchy bilateral basilar opacities concerning for multifocal pneumonia with small basilar effusions as well as enlarging L perihilar and lower lobe process concerning for worsening of disease in chest/disease recurrence of uncertain significance in current setting of pneumonia (see report) She was given RegenCov on 8/17. Patient was initially requiring 6 L of oxygen.  Over the past 2 to 3 days she has been weaned down to 2lpm.  However she continues to have wheezing.  She was started on Combivent.  Chest x-ray repeated today which does not show any new changes.  Nonspecific opacity noted in the left lower lung which was seen previously as well and could be related to her underlying cancer.  Will not  pursue this further as she is not a candidate for any treatments. Wheezing has improved some but still persists.  Patient not noted to be on any inhaled steroids which will be initiated today. Today will be her last day of Remdesivir.  Continue steroids. She also remains on antibacterials.  Procalcitonin was significantly elevated with a peak of 12.31.  Improved to 2.37 this morning.  Elevated WBC most likely due to steroids.  Plan for a 7-day course.  We will recheck procalcitonin tomorrow. Not a candidate for Actemra or baricitinib due to elevated procalcitonin. Inflammatory markers had improved. Incentive spirometry, prone positioning any, mobilization as much as possible.   AKI on CKD IIIb Baseline creatinine 1.4.  Presented with  creatinine of 2.14.  Was gently hydrated.  Renal function back to baseline.   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  There was a discussion of IVC filter placement at the last hospitalization, but the risk of filter was thought to exceed benefit and the recommendation was for stopping anticoagulation and only recommending filter if recurrence of thrombotic disease. Lower extremity Doppler study done during this hospitalization shows indeterminate age DVT in the left leg.  This is thought to be an old DVT based on discussion with vascular surgery.  No anticoagulation at this time due to high risk of bleeding.  Currently on prophylactic doses of subcutaneous heparin.   Renal Cell Carcinoma  Tumor Thrombus Per discharge summary from last admission, patient went home on palliative care/hospice. Received palliative radiation on 8/9. She's currently under the care of hospice/[facility]. Palliative care has been consulted to assist with goals of care. Plan is to continue current treatment without any escalation.   History of COPD on home oxygen at 2 L/min Stable.  Continue Combivent.  Dulera added today.   Essential hypertension Pressure is reasonably well controlled.  Continue diltiazem.      GERD Continue PPI   Macrocytic Anemia Hemoglobin stable for the most part.  No evidence of overt bleeding.  Transfuse if it drops below 7.   Anxiety/depression Continue mirtazapine   Constipation Complains of left lower abdominal pain.  Still no bowel movement.  We will add suppository today.  Enema as needed.  Abdomen remains benign.  Continue laxatives.   D/C planning: Patient is not medically stable to discharge - D/C date unknown at this time. Once disposition is determined - Please use [facility] for transportation needs of [facility] patients.    Goals of Care: Clear: DNR - patient family confirmed continued wishes for patient to discharge home with hospice upon discharge.   Communication with IDT: Updated [facility] team with patient condition/status.    Communication with PC[facility]: Supported Patient spouse via telephone - he also tested positive for Covid and is in quarantine until Tuesday. He appreciated the call.    Please call with any hospice related questions.  Thank you,  Freddie Breech, RN  [facility] Liaison [phone]

## 2019-11-08 NOTE — Progress Notes (Signed)
Pharmacy Antibiotic Note  Melanie Best is a 74 y.o. female admitted on 11/03/2019 with pneumonia; found to be COVID+. Pharmacy has been consulted for vancomycin dosing. Patient also receiving Cefepime, and Azithromycin; Regeneron given 8/17, now on D5 of remdesivir  Today, 11/08/2019:  Day 5 Abxs  Afebrile  WBC 13.8 up but patient on steroids  PCT improving but should be interpreted carefully in setting of malignancy  SCr improved  Plan:  Continue Vanc 750mg  IV q24  Measure vancomycin level at steady state as indicated  SCr q48 while on vanc  MRSA PCR ordered; f/u  Continue cefepime, azithromycin, remdesivir per MD; dosing appropriate - anticipate to complete 7 days   Height: 5\' 6"  (167.6 cm) Weight: 57.2 kg (126 lb 1.7 oz) IBW/kg (Calculated) : 59.3  Temp (24hrs), Avg:97.6 F (36.4 C), Min:97.5 F (36.4 C), Max:97.7 F (36.5 C)  Recent Labs  Lab 11/03/19 1624 11/03/19 1627 11/04/19 0445 11/05/19 0555 11/06/19 0455 11/07/19 0500 11/08/19 0400  WBC   < >  --  11.6* 11.4* 10.0 9.6 13.8*  CREATININE   < >  --  1.69* 1.45* 1.44* 1.44* 1.28*  LATICACIDVEN  --  1.3  --   --   --   --   --    < > = values in this interval not displayed.    Estimated Creatinine Clearance: 35.9 mL/min (A) (by C-G formula based on SCr of 1.28 mg/dL (H)).    No Known Allergies  Antimicrobials this admission: 8/17 vancomycin >>  8/17 cefepime >>  8/17 azithromycin >> 8/21 8/17 remdesivir >> 8/21  Dose adjustments this admission: n/a  Microbiology results: 8/17 BCx: ngtd 8/17 Sputum: sent  8/17 MRSA PCR: ordered  Thank you for allowing pharmacy to be a part of this patient's care.  Kara Mead 11/08/2019 10:10 AM

## 2019-11-09 ENCOUNTER — Inpatient Hospital Stay (HOSPITAL_COMMUNITY)

## 2019-11-09 LAB — CULTURE, BLOOD (ROUTINE X 2)
Culture: NO GROWTH
Culture: NO GROWTH
Special Requests: ADEQUATE

## 2019-11-09 LAB — CBC
HCT: 25.9 % — ABNORMAL LOW (ref 36.0–46.0)
Hemoglobin: 7.8 g/dL — ABNORMAL LOW (ref 12.0–15.0)
MCH: 29.5 pg (ref 26.0–34.0)
MCHC: 30.1 g/dL (ref 30.0–36.0)
MCV: 98.1 fL (ref 80.0–100.0)
Platelets: 121 10*3/uL — ABNORMAL LOW (ref 150–400)
RBC: 2.64 MIL/uL — ABNORMAL LOW (ref 3.87–5.11)
RDW: 18.1 % — ABNORMAL HIGH (ref 11.5–15.5)
WBC: 14.2 10*3/uL — ABNORMAL HIGH (ref 4.0–10.5)
nRBC: 1.1 % — ABNORMAL HIGH (ref 0.0–0.2)

## 2019-11-09 LAB — COMPREHENSIVE METABOLIC PANEL
ALT: 51 U/L — ABNORMAL HIGH (ref 0–44)
AST: 54 U/L — ABNORMAL HIGH (ref 15–41)
Albumin: 2.4 g/dL — ABNORMAL LOW (ref 3.5–5.0)
Alkaline Phosphatase: 186 U/L — ABNORMAL HIGH (ref 38–126)
Anion gap: 11 (ref 5–15)
BUN: 45 mg/dL — ABNORMAL HIGH (ref 8–23)
CO2: 24 mmol/L (ref 22–32)
Calcium: 7.9 mg/dL — ABNORMAL LOW (ref 8.9–10.3)
Chloride: 105 mmol/L (ref 98–111)
Creatinine, Ser: 1.25 mg/dL — ABNORMAL HIGH (ref 0.44–1.00)
GFR calc Af Amer: 50 mL/min — ABNORMAL LOW (ref >60)
GFR calc non Af Amer: 43 mL/min — ABNORMAL LOW (ref >60)
Glucose, Bld: 242 mg/dL — ABNORMAL HIGH (ref 70–99)
Potassium: 3.4 mmol/L — ABNORMAL LOW (ref 3.5–5.1)
Sodium: 140 mmol/L (ref 135–145)
Total Bilirubin: 0.7 mg/dL (ref 0.3–1.2)
Total Protein: 4.8 g/dL — ABNORMAL LOW (ref 6.5–8.1)

## 2019-11-09 LAB — PROCALCITONIN: Procalcitonin: 1.3 ng/mL

## 2019-11-09 MED ORDER — PREDNISONE 20 MG PO TABS
60.0000 mg | ORAL_TABLET | Freq: Every day | ORAL | Status: DC
  Administered 2019-11-10 – 2019-11-11 (×2): 60 mg via ORAL
  Filled 2019-11-09 (×2): qty 3

## 2019-11-09 MED ORDER — POTASSIUM CHLORIDE CRYS ER 20 MEQ PO TBCR
40.0000 meq | EXTENDED_RELEASE_TABLET | Freq: Once | ORAL | Status: AC
  Administered 2019-11-09: 40 meq via ORAL
  Filled 2019-11-09: qty 2

## 2019-11-09 NOTE — Progress Notes (Signed)
PROGRESS NOTE    Melanie Best  WER:[id number] DOB: 23-Jul-1945 DOA: 11/03/2019 PCP: [name], MD   Chief Complaint  Patient presents with  . Shortness of Breath  . Fatigue    Brief Narrative:  Melanie Best is a 74 y.o. 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  She was admitted from August 4 to August 7 after being sent from her hematologist office with finding of new acute blood loss anemia secondary to gross hematuria from her known renal cell carcinoma and anticoagulation with Xarelto.  During admission she was transfused 2 units PRBC with her hemoglobin rising to 9.3 at discharge.  Her Xarelto was held at discharge given her poor prognosis there is no further plan for anticoagulation.  She was also seen by palliative care with plan to go home on palliative versus hospice, and was made DNR at that time.  She presented to the ED for fatigue and lack of appetite.  She lives with her husband and her son, her son has been admitted to the hospital due to Covid in the past week.  She reports that she has received her Covid vaccine series back in March, she endorses a chronic shortness of breath but states that it is not any different than how she usually feels.  She has been on her usual 2 L of oxygen without needing additional oxygen.  With regards to hospice she is unsure of details, but states that a hospital bed was brought out to her home.  Review of chart also shows that she received palliative radiation on August 9.  At discharge she was prescribed morphine and fentanyl patches for air hunger but states she does not take this because she does not like the way it makes her feel.  Work-up in the ED showed vital signs with slightly soft blood pressures in 90s over 40s that are lower compared to her baseline,  stable hemoglobin since discharge at 8.4, and CMP showing increasing creatinine from 1.4 to 2.14 since discharge.  She was also found to be incidentally Covid positive.  She was admitted for management of AKI.  Subsequently developed respiratory failure as well.  Assessment & Plan:   Acute on Chronic Hypoxic Respiratory Failure 2/2 COVID 19 Pneumonia/concern for healthcare associated pneumonia This is a breakthrough infection.  Patient previously vaccinated.  At baseline she is on 2 L of oxygen.    CT chest with patchy bilateral basilar opacities concerning for multifocal pneumonia with small basilar effusions as well as enlarging L perihilar and lower lobe process concerning for worsening of disease in chest/disease recurrence of uncertain significance in current setting of pneumonia (see report) She was given RegenCov on 8/17. Patient has completed course of Remdesivir.  She remains on steroids.  We will start tapering. Respiratory status has improved with oxygen requirements down to 2 to 3 L from a peak of 6 L. She was also started on antibacterials due to significant elevated procalcitonin of 12.31.  Cultures have been negative.  Procalcitonin level has improved.  We will plan for a 7-day course. Patient also noted to have significant wheezing.  Started on bronchodilators and inhaled steroids with which she has shown improvement.  Patient reassured. Not a candidate for Actemra or baricitinib due to elevated procalcitonin. Inflammatory markers had improved. Incentive spirometry, prone positioning, mobilization as much as possible.  AKI  on CKD IIIb/hypokalemia Baseline creatinine 1.4.  Presented with creatinine of 2.14.  Was gently hydrated.  Renal function back to baseline.  Replace potassium.  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   There was a discussion of IVC filter placement at the last hospitalization, but the risk of filter was thought to exceed benefit and the recommendation was for stopping anticoagulation and only recommending filter if recurrence of thrombotic disease. Lower extremity Doppler study done during this hospitalization shows indeterminate age DVT in the left leg.  This is thought to be an old DVT based on discussion with vascular surgery.  No anticoagulation at this time due to high risk of bleeding.  Currently on prophylactic doses of subcutaneous heparin.  Renal Cell Carcinoma  Tumor Thrombus Per discharge summary from last admission, patient went home on palliative care/hospice. Received palliative radiation on 8/9. She's currently under the [facility]. Palliative care has been consulted to assist with goals of care. Plan is to continue current treatment without any escalation.  History of COPD on home oxygen at 2 L/min Stable.  Continue Combivent.  Dulera added today.  Essential hypertension Blood pressure is reasonably well controlled.  Continue diltiazem.    GERD Continue PPI  Macrocytic Anemia Hemoglobin stable for the most part.  No evidence of overt bleeding.  Transfuse if it drops below 7.  Anxiety/depression Continue mirtazapine  Constipation Patient had a bowel movement yesterday.  Continues to complain of abdominal pain on and off.  Abdomen is benign.  Will do abdominal film.  Continue laxatives.  DVT prophylaxis: Subcutaneous Code Status: DNR Family Communication: We will update family later today. Disposition: Plan is for patient to go back home with family and with hospice to follow.    Status is: Inpatient  Remains inpatient appropriate because:IV treatments appropriate due to intensity of illness or inability to take PO and Inpatient level of care appropriate due to severity of illness   Dispo:  Patient From: Home  Planned Disposition:  Home  Expected discharge date: 11/10/19  Medically stable for discharge: No      Consultants:   Palliative Care  Oncology  Procedures:   none  Antimicrobials:  Anti-infectives (From admission, onward)   Start     Dose/Rate Route Frequency Ordered Stop   11/05/19 1430  vancomycin (VANCOREADY) IVPB 500 mg/100 mL  Status:  Discontinued        500 mg 100 mL/hr over 60 Minutes Intravenous Every 24 hours 11/04/19 1241 11/05/19 1203   11/05/19 1430  vancomycin (VANCOREADY) IVPB 750 mg/150 mL        750 mg 150 mL/hr over 60 Minutes Intravenous Every 24 hours 11/05/19 1203 11/12/19 1429   11/05/19 1000  remdesivir 100 mg in sodium chloride 0.9 % 100 mL IVPB       "Followed by" Linked Group Details   100 mg 200 mL/hr over 30 Minutes Intravenous Daily 11/04/19 1239 11/08/19 0956   11/04/19 1500  remdesivir 200 mg in sodium chloride 0.9% 250 mL IVPB       "Followed by" Linked Group Details   200 mg 580 mL/hr over 30 Minutes Intravenous Once 11/04/19 1239 11/04/19 1449   11/04/19 1330  vancomycin (VANCOCIN) IVPB 1000 mg/200 mL premix        1,000 mg 200 mL/hr over 60 Minutes Intravenous  Once 11/04/19 1239 11/04/19 1428   11/04/19 1300  ceFEPIme (MAXIPIME) 2 g in sodium chloride 0.9 % 100 mL IVPB        2 g 200 mL/hr over 30 Minutes Intravenous Daily 11/04/19 1239 11/11/19 1359   11/04/19 1230  cefTRIAXone (ROCEPHIN) 2 g in sodium chloride 0.9 % 100 mL IVPB  Status:  Discontinued        2 g 200 mL/hr over 30 Minutes Intravenous Every 24 hours 11/04/19 1227 11/04/19 1229   11/04/19 1230  azithromycin (ZITHROMAX) tablet 500 mg        500 mg Oral Daily 11/04/19 1227 11/08/19 0913     Subjective: Abdominal pain present on and off.  Shortness of breath improved though still persists.  Objective: Vitals:   11/08/19 0503 11/08/19 1403 11/08/19 2022 11/09/19 0603  BP:  127/62 (!) 142/66 121/68  Pulse:  95 92 84  Resp:  18 (!) 21 20  Temp:  97.8 F (36.6 C) 98.7 F (37.1 C) 98.3  F (36.8 C)  TempSrc:    Oral  SpO2:  100% 92% 100%  Weight: 57.2 kg   58.1 kg  Height:        Intake/Output Summary (Last 24 hours) at 11/09/2019 1240 Last data filed at 11/09/2019 0900 Gross per 24 hour  Intake 480 ml  Output 300 ml  Net 180 ml   Filed Weights   11/07/19 0440 11/08/19 0503 11/09/19 0603  Weight: 55 kg 57.2 kg 58.1 kg    Examination:  General appearance: Awake alert.  In no distress Resp: Slightly tachypneic though no use of accessory muscles noted.  Few crackles at the bases.  Wheezing has significantly improved compared to yesterday.   Cardio: S1-S2 is normal regular.  No S3-S4.  No rubs murmurs or bruit GI: Abdomen is soft.  Nontender nondistended.  Bowel sounds are present normal.  No masses organomegaly Extremities: No edema.  Full range of motion of lower extremities. Neurologic: No focal neurological deficits.     Data Reviewed: I have personally reviewed following labs and imaging studies  CBC: Recent Labs  Lab 11/03/19 1624 11/04/19 0445 11/05/19 0555 11/06/19 0455 11/07/19 0500 11/08/19 0400 11/09/19 0430  WBC 12.0*   < > 11.4* 10.0 9.6 13.8* 14.2*  NEUTROABS 10.4*  --  10.6*  --   --   --   --   HGB 8.4*   < > 8.3* 8.0* 7.7* 8.6* 7.8*  HCT 28.7*   < > 28.2* 27.4* 26.5* 29.9* 25.9*  MCV 101.8*   < > 100.7* 99.6 100.0 102.7* 98.1  PLT 128*   < > 122* 120* 109* 118* 121*   < > = values in this interval not displayed.    Basic Metabolic Panel: Recent Labs  Lab 11/05/19 0555 11/06/19 0455 11/07/19 0500 11/08/19 0400 11/09/19 0430  NA 138 138 140 142 140  K 4.5 4.4 4.0 3.7 3.4*  CL 106 106 107 109 105  CO2 20* 22 23 19* 24  GLUCOSE 168* 136* 193* 153* 242*  BUN 35* 42* 49* 46* 45*  CREATININE 1.45* 1.44* 1.44* 1.28* 1.25*  CALCIUM 7.5* 7.7* 8.0* 7.9* 7.9*  MG 1.9  --   --   --   --   PHOS 4.1  --   --   --   --     GFR: Estimated Creatinine Clearance: 37.3 mL/min (A) (by C-G formula based on SCr of 1.25 mg/dL (H)).  Liver  Function Tests: Recent  Labs  Lab 11/05/19 0555 11/06/19 0455 11/07/19 0500 11/08/19 0400 11/09/19 0430  AST 71* 51* 44* 58* 54*  ALT 45* 43 40 48* 51*  ALKPHOS 337* 264* 232* 228* 186*  BILITOT 0.6 0.6 0.4 0.3 0.7  PROT 5.4* 5.3* 5.0* 5.3* 4.8*  ALBUMIN 2.4* 2.4* 2.4* 2.5* 2.4*    CBG: Recent Labs  Lab 11/04/19 0843 11/04/19 0927 11/04/19 1305  GLUCAP 66* 94 103*     Recent Results (from the past 240 hour(s))  SARS Coronavirus 2 by RT PCR (hospital order, performed in [facility] hospital lab) Nasopharyngeal Nasopharyngeal Swab     Status: Abnormal   Collection Time: 11/03/19  4:40 PM   Specimen: Nasopharyngeal Swab  Result Value Ref Range Status   SARS Coronavirus 2 POSITIVE (A) NEGATIVE Final    Comment: RESULT CALLED TO, READ BACK BY AND VERIFIED WITH: C.Rosine Abe [id number] @1859  BY V.WILKINS (NOTE) SARS-CoV-2 target nucleic acids are DETECTED  SARS-CoV-2 RNA is generally detectable in upper respiratory specimens  during the acute phase of infection.  Positive results are indicative  of the presence of the identified virus, but do not rule out bacterial infection or co-infection with other pathogens not detected by the test.  Clinical correlation with patient history and  other diagnostic information is necessary to determine patient infection status.  The expected result is negative.  Fact Sheet for Patients:   StrictlyIdeas.no   Fact Sheet for Healthcare Providers:   BankingDealers.co.za    This test is not yet approved or cleared by the [place] FDA and  has been authorized for detection and/or diagnosis of SARS-CoV-2 by FDA under an Emergency Use Authorization (EUA).  This EUA will remain in effect (meaning th is test can be used) for the duration of  the COVID-19 declaration under Section 564(b)(1) of the Act, 21 U.S.C. section 360-bbb-3(b)(1), unless the authorization is terminated or revoked  sooner.  Performed at [facility], [place] [address], [place], [place] [address]   Culture, blood (routine x 2) Call MD if unable to obtain prior to antibiotics being given     Status: None   Collection Time: 11/04/19  1:10 PM   Specimen: Left Antecubital; Blood  Result Value Ref Range Status   Specimen Description   Final    LEFT ANTECUBITAL Performed at [place] [address] [place], [place] [address]    Special Requests   Final    BOTTLES DRAWN AEROBIC AND ANAEROBIC Blood Culture results may not be optimal due to an excessive volume of blood received in culture bottles Performed at [place] [address] [place], [place] [address]    Culture   Final    NO GROWTH 5 DAYS Performed at [facility], [place] [address] [place], [place] [address]    Report Status 11/09/2019 FINAL  Final  Culture, blood (routine x 2) Call MD if unable to obtain prior to antibiotics being given     Status: None   Collection Time: 11/04/19  1:20 PM   Specimen: Right Antecubital; Blood  Result Value Ref Range Status   Specimen Description   Final    RIGHT ANTECUBITAL Performed at [place] [address], [place], [place] [address]    Special Requests   Final    BOTTLES DRAWN AEROBIC AND ANAEROBIC Blood Culture adequate volume Performed at [place] [address] [place], [place] [address]    Culture  Final    NO GROWTH 5 DAYS Performed at [facility], [place] [address] [place], [place] [address]    Report Status 11/09/2019 FINAL  Final  MRSA PCR Screening     Status: Abnormal   Collection Time: 11/05/19 12:55 PM   Specimen: Nasal Mucosa; Nasopharyngeal  Result Value Ref Range Status   MRSA by PCR POSITIVE (A) NEGATIVE Final    Comment:        The GeneXpert MRSA Assay (FDA approved for NASAL specimens only), is one component of a comprehensive MRSA  colonization surveillance program. It is not intended to diagnose MRSA infection nor to guide or monitor treatment for MRSA infections. RESULT CALLED TO, READ BACK BY AND VERIFIED WITH: H PARISH AT 9169 ON 11/05/2019 BY MOSLEY,J  Performed at [facility], [place] [address], [place], [place] [address]          Radiology Studies: [facility] [place] 1 View  Result Date: 11/08/2019 CLINICAL DATA:  74 year old female with history of pneumonia from COVID infection. EXAM: PORTABLE CHEST 1 VIEW COMPARISON:  Chest x-ray 11/03/2019. FINDINGS: Right internal jugular single-lumen porta cath with tip terminating in the distal superior vena cava. Lung volumes are increased with emphysematous changes. Some linear scarring is noted in the right upper lobe and left mid lung, similar to prior examinations. Focal left lower lobe opacity in the retrocardiac region somewhat mass-like in appearance. No airspace consolidation in the right lung. No pleural effusions. No evidence of pulmonary edema. No pneumothorax. Skin fold artifact over the mid to lower left hemithorax. Heart size is normal. Upper mediastinal contours are within normal limits. Aortic atherosclerosis. IMPRESSION: 1. Support apparatus, as above. 2. Focal mass-like opacity in the left lower lobe which may reflect pneumonia, although underlying neoplasm is not excluded. Correlation with prior chest CT is recommended. 3. Aortic atherosclerosis. 4. Emphysema. Electronically Signed   By: Vinnie Langton M.D.   On: 11/08/2019 07:31     Scheduled Meds: . brinzolamide  1 drop Both Eyes TID  . Chlorhexidine Gluconate Cloth  6 each Topical Daily  . diltiazem  120 mg Oral Daily  . heparin injection (subcutaneous)  5,000 Units Subcutaneous Q12H  . Ipratropium-Albuterol  4 puff Inhalation QID  . lactose free nutrition  237 mL Oral TID WC  . latanoprost  1 drop Both Eyes QHS  . methylPREDNISolone (SOLU-MEDROL) injection  40 mg Intravenous Q12H   . mirtazapine  7.5 mg Oral Daily  . mometasone-formoterol  2 puff Inhalation BID  . multivitamin with minerals  1 tablet Oral Daily  . mupirocin ointment  1 application Nasal BID  . pantoprazole  80 mg Oral Q1200  . polyethylene glycol  17 g Oral BID  . senna  2 tablet Oral BID  . sodium chloride flush  10-40 mL Intracatheter Q12H   Continuous Infusions: . sodium chloride 10 mL/hr at 11/06/19 0600  . ceFEPime (MAXIPIME) IV 2 g (11/08/19 1322)  . vancomycin 750 mg (11/08/19 1545)     LOS: 5 days    Bonnielee Haff, MD [facility]   To contact the attending provider between 7A-7P or the covering provider during after hours 7P-7A, please log into the web site [email or web address] and access using universal [place] password for that web site. If you do not have the password, please call the hospital operator.  11/09/2019, 12:40 PM

## 2019-11-09 NOTE — Progress Notes (Signed)
[id number] - Manufacturing engineer [facility]) [facility] Liaison Note 11/09/19   Melanie Best is a current hospice patient with [facility] with a terminal diagnosis of Renal Cell Carcinoma. Patient initiated call to EMS from the home for evaluation of increased SOB/pain/fatigue.  Patient was transported to [facility] then admitted on 11/04/19 for management of AKI. This is a related admission. Patient is a DNR.   In person visits restricted due to hospital Covid precautions.   General appearance: Awake alert.  In no distress Resp: Slightly tachypneic though no use of accessory muscles noted.  Few crackles at the bases.  Wheezing has significantly improved compared to yesterday.   Cardio: S1-S2 is normal regular.  No S3-S4.  No rubs murmurs or bruit GI: Abdomen is soft.  Nontender nondistended.  Bowel sounds are present normal.  No masses organomegaly Extremities: No edema.  Full range of motion of lower extremities. Neurologic: No focal neurological deficits.  VS: 98.5, 129/69, 86, 17, 97% 3Lnc  Abnormal Labs:  WBC 4.0 - 10.5 K/uL 14.2 High   RBC 3.87 - 5.11 MIL/uL 2.64 Low   Hemoglobin 12.0 - 15.0 g/dL 7.8 Low   HCT 36 - 46 % 25.9 Low   RDW 11.5 - 15.5 % 18.1 High   Platelets 150 - 400 K/uL 121 Low  Potassium 3.5 - 5.1 mmol/L 3.4 Low   Glucose, Bld 70 - 99 mg/dL 242 High   Comment: Glucose reference range applies only to samples taken after fasting for at least 8 hours. BUN 8 - 23 mg/dL 45 High   Creatinine, Ser 0.44 - 1.00 mg/dL 1.25 High   Calcium 8.9 - 10.3 mg/dL 7.9 Low   Total Protein 6.5 - 8.1 g/dL 4.8 Low   Albumin 3.5 - 5.0 g/dL 2.4 Low   AST 15 - 41 U/L 54 High   ALT 0 - 44 U/L 51 High   Alkaline Phosphatase 38 - 126 U/L 186 High   GFR calc non Af Amer >60 mL/min 43 Low   GFR calc Af Amer >60 mL/min 50 Low   Diagnostics:  CLINICAL DATA:  Abdominal pain,   EXAM: PORTABLE ABDOMEN - 1 VIEW   COMPARISON:  Chest x-ray obtained yesterday; prior CT scan of the chest 11/04/2019    FINDINGS: Stable appearance of the lower lungs. Triangular opacity overlying the left lower lobe appears similar compared to prior imaging. The bowel gas pattern is not obstructed. Moderate colonic stool burden. Surgical clips present in the right hemiabdomen. No evidence of free air. No suspicious calcifications. Several small vascular phleboliths are identified. No acute osseous abnormality.   IMPRESSION: 1. Nonobstructive bowel gas pattern. 2. Moderate colonic stool burden. 3. Stable left lower lobe airspace opacity.   IV/PRN medications: Maxipime 2g in NS 158ml IVPB QD, Vancomycin IVPB 7500mg /176ml QD PRNs: Oxycodone 5mg  PO PRN    Problem list from Epic: Assessment & Plan:   Acute on Chronic Hypoxic Respiratory Failure 2/2 COVID 19 Pneumonia/concern for healthcare associated pneumonia This is a breakthrough infection.  Patient previously vaccinated.  At baseline she is on 2 L of oxygen.    CT chest with patchy bilateral basilar opacities concerning for multifocal pneumonia with small basilar effusions as well as enlarging L perihilar and lower lobe process concerning for worsening of disease in chest/disease recurrence of uncertain significance in current setting of pneumonia (see report) She was given RegenCov on 8/17. Patient has completed course of Remdesivir.  She remains on steroids.  We will start tapering. Respiratory status  has improved with oxygen requirements down to 2 to 3 L from a peak of 6 L. She was also started on antibacterials due to significant elevated procalcitonin of 12.31.  Cultures have been negative.  Procalcitonin level has improved.  We will plan for a 7-day course. Patient also noted to have significant wheezing.  Started on bronchodilators and inhaled steroids with which she has shown improvement.  Patient reassured. Not a candidate for Actemra or baricitinib due to elevated procalcitonin. Inflammatory markers had improved. Incentive spirometry, prone  positioning, mobilization as much as possible.   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  Presented with creatinine of 2.14.  Was gently hydrated.  Renal function back to baseline.  Replace potassium.   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  There was a discussion of IVC filter placement at the last hospitalization, but the risk of filter was thought to exceed benefit and the recommendation was for stopping anticoagulation and only recommending filter if recurrence of thrombotic disease. Lower extremity Doppler study done during this hospitalization shows indeterminate age DVT in the left leg.  This is thought to be an old DVT based on discussion with vascular surgery.  No anticoagulation at this time due to high risk of bleeding.  Currently on prophylactic doses of subcutaneous heparin.   Renal Cell Carcinoma  Tumor Thrombus Per discharge summary from last admission, patient went home on palliative care/hospice. Received palliative radiation on 8/9. She's currently under the [facility]. Palliative care has been consulted to assist with goals of care. Plan is to continue current treatment without any escalation.   History of COPD on home oxygen at 2 L/min Stable.  Continue Combivent.  Dulera added today.   Essential hypertension Blood pressure is reasonably well controlled.  Continue diltiazem.     GERD Continue PPI   Macrocytic Anemia Hemoglobin stable for the most part.  No evidence of overt bleeding.  Transfuse if it drops below 7.   Anxiety/depression Continue mirtazapine   Constipation Patient had a bowel movement yesterday.  Continues to complain of abdominal pain on and off.  Abdomen is benign.  Will do abdominal film.  Continue laxatives.   DVT prophylaxis: Subcutaneous Code Status:  DNR Family Communication: We will update family later today. Disposition: Plan is for patient to go back home with family and with hospice to follow.     Status is: Inpatient   Remains inpatient appropriate because:IV treatments appropriate due to intensity of illness or inability to take PO and Inpatient level of care appropriate due to severity of illness   D/C planning: Patient is not medically stable to discharge - D/C date unknown at this time. Once disposition is determined - Please use [facility] for transportation needs of [facility] patients.   Goals of Care: Clear: DNR - patient family confirmed continued wishes for patient to discharge home with hospice upon discharge.   Communication with IDT: Updated [place] team with patient condition/status.    Communication with PCG: Unable to reach pt's husband but he is aware that he can call [facility] with any needs.   Please call with any hospice related questions.  Thank you,  Freddie Breech, RN  [facility] Liaison [phone]

## 2019-11-10 LAB — CBC
HCT: 25.4 % — ABNORMAL LOW (ref 36.0–46.0)
Hemoglobin: 7.7 g/dL — ABNORMAL LOW (ref 12.0–15.0)
MCH: 29.7 pg (ref 26.0–34.0)
MCHC: 30.3 g/dL (ref 30.0–36.0)
MCV: 98.1 fL (ref 80.0–100.0)
Platelets: 125 10*3/uL — ABNORMAL LOW (ref 150–400)
RBC: 2.59 MIL/uL — ABNORMAL LOW (ref 3.87–5.11)
RDW: 18.6 % — ABNORMAL HIGH (ref 11.5–15.5)
WBC: 16.5 10*3/uL — ABNORMAL HIGH (ref 4.0–10.5)
nRBC: 3.6 % — ABNORMAL HIGH (ref 0.0–0.2)

## 2019-11-10 LAB — COMPREHENSIVE METABOLIC PANEL
ALT: 57 U/L — ABNORMAL HIGH (ref 0–44)
AST: 61 U/L — ABNORMAL HIGH (ref 15–41)
Albumin: 2.3 g/dL — ABNORMAL LOW (ref 3.5–5.0)
Alkaline Phosphatase: 169 U/L — ABNORMAL HIGH (ref 38–126)
Anion gap: 9 (ref 5–15)
BUN: 42 mg/dL — ABNORMAL HIGH (ref 8–23)
CO2: 26 mmol/L (ref 22–32)
Calcium: 7.9 mg/dL — ABNORMAL LOW (ref 8.9–10.3)
Chloride: 107 mmol/L (ref 98–111)
Creatinine, Ser: 1.3 mg/dL — ABNORMAL HIGH (ref 0.44–1.00)
GFR calc Af Amer: 47 mL/min — ABNORMAL LOW (ref >60)
GFR calc non Af Amer: 41 mL/min — ABNORMAL LOW (ref >60)
Glucose, Bld: 126 mg/dL — ABNORMAL HIGH (ref 70–99)
Potassium: 3.5 mmol/L (ref 3.5–5.1)
Sodium: 142 mmol/L (ref 135–145)
Total Bilirubin: 0.5 mg/dL (ref 0.3–1.2)
Total Protein: 4.7 g/dL — ABNORMAL LOW (ref 6.5–8.1)

## 2019-11-10 MED ORDER — FLEET ENEMA 7-19 GM/118ML RE ENEM: 1.0000 | ENEMA | Freq: Once | RECTAL | Status: DC

## 2019-11-10 MED ORDER — POTASSIUM CHLORIDE CRYS ER 20 MEQ PO TBCR
40.0000 meq | EXTENDED_RELEASE_TABLET | Freq: Once | ORAL | Status: AC
  Administered 2019-11-10: 40 meq via ORAL
  Filled 2019-11-10: qty 2

## 2019-11-10 NOTE — Progress Notes (Signed)
WL 1536 - Manufacturing engineer [facility]) [facility] Liaison Note8/22/21  Melanie Best is a current hospice patient with [facility] with a terminal diagnosis of Renal Cell Carcinoma. Patient initiated call to EMS from the home for evaluation of increased SOB/pain/fatigue. Patient was transported to [facility] then admitted on 11/04/19 for management of AKI. This is a related admission. Patient is a DNR.  In person visits restricted due to hospital Covid precautions. Patient is awake and alert. She continues to be tachypneic with expiratory wheezes and some crackles in the bases. No recorded PO intake today. She continues to be weak and need assistance with ADLs and transfers.    VS: 98.6, 130/66, 84, 19, 91% on 1Lnc Abn Labs: Glucose 126, BUN 42, Creatinine 1.30, Calcium 7.9, alkaline phos 169, Albumin 2.3, AST 61, ALT 57, total protein 4.7, GFR 41, WBC 16.5, RBC 2.56, Hbg 7.7, HCT 25.4, RDW 18.6, platelets 125, nRBC 3.6   Problem List Acute on Chronic Hypoxic Respiratory Failure 2/2 COVID 19 Pneumonia/concern for healthcare associated pneumonia This is a breakthrough infection.  Patient previously vaccinated.  At baseline she is on 2 L of oxygen.    CT chest with patchy bilateral basilar opacities concerning for multifocal pneumonia with small basilar effusions as well as enlarging L perihilar and lower lobe process concerning for worsening of disease in chest/disease recurrence of uncertain significance in current setting of pneumonia (see report) She was given RegenCov on 8/17. Patient has completed course of Remdesivir.  Steroids to be tapered gradually. Respiratory status has improved with oxygen requirements down to 2 to 3 L from a peak of 6 L. She was also started on antibacterials due to significant elevated procalcitonin of 12.31.  Cultures have been negative.  Procalcitonin level has improved.  Plan for 7-day course which should complete tomorrow. Patient was also noted to be wheezing extensively.   Patient does have a history of emphysema.  Patient was started on bronchodilators and inhaled steroids.  Wheezing has improved. Incentive spirometry, prone positioning, mobilization as much as possible. 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  Presented with creatinine of 2.14.  Was gently hydrated.  Renal function back to baseline.  Replace potassium. Elevated D dimer  Hx Left Common Femoral Vein DVT Lower extremity Doppler study done during this hospitalization shows indeterminate age DVT in the left leg.  This is thought to be an old DVT based on discussion with vascular surgery.  No anticoagulation at this time due to high risk of bleeding.  Currently on prophylactic doses of subcutaneous heparin. History of COPD on home oxygen at 2 L/min Stable.  Continue Combivent.  Dulera added today. Renal Cell Carcinoma  Tumor Thrombus Macrocytic Anemia Hemoglobin stable for the most part.  No evidence of overt bleeding.  Transfuse if it drops below 7  Remains inpatient appropriate because:IV treatments appropriate due to intensity of illness or inability to take PO and Inpatient level of care appropriate due to severity of illness Dispo:             Patient From: Home             Planned Disposition: Home              Expected discharge date: 11/11/19             Medically stable for discharge: No  D/C planning: Patient is not medically stable to discharge -D/C date unknown at this time. Once disposition is determined -Please use [facility] for transportation needs of [facility]  patients.  Goals of Care: Clear: DNR -patient family confirmed continued wishes for patient to discharge home with hospice upon discharge.  Communication with IDT: Updated [place] team with patient condition/status.   Communication with PCG: Supported Patient spouse via telephone -has [place] contact information/enc to call as needed. Informed that an [facility] will follow patient daily during this hospitalization. Offered  support.   Please call with any hospice related questions.  Farrel Gordon, RN, CCM  [facility] Liaison (listed on [place] under [facility]osp[facility])    [phone]

## 2019-11-10 NOTE — Care Management Important Message (Signed)
Important Message  Patient Details IM Letter given to the Patient Name: Melanie Best MRN: [id number] Date of Birth: Jan 23, 1946   Medicare Important Message Given:  Yes     Kerin Salen 11/10/2019, 9:55 AM

## 2019-11-10 NOTE — Progress Notes (Signed)
PROGRESS NOTE    Melanie Best  AUQ:[id number] DOB: Jun 10, 1945 DOA: 11/03/2019 PCP: [name], MD   Chief Complaint  Patient presents with  . Shortness of Breath  . Fatigue    Brief Narrative:  Melanie Best is a 74 y.o. 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  She was admitted from August 4 to August 7 after being sent from her hematologist office with finding of new acute blood loss anemia secondary to gross hematuria from her known renal cell carcinoma and anticoagulation with Xarelto.  During admission she was transfused 2 units PRBC with her hemoglobin rising to 9.3 at discharge.  Her Xarelto was held at discharge given her poor prognosis there is no further plan for anticoagulation.  She was also seen by palliative care with plan to go home on palliative versus hospice, and was made DNR at that time.  She presented to the ED for fatigue and lack of appetite.  She lives with her husband and her son, her son has been admitted to the hospital due to Covid in the past week.  She reports that she has received her Covid vaccine series back in March, she endorses a chronic shortness of breath but states that it is not any different than how she usually feels.  She has been on her usual 2 L of oxygen without needing additional oxygen.  With regards to hospice she is unsure of details, but states that a hospital bed was brought out to her home.  Review of chart also shows that she received palliative radiation on August 9.  At discharge she was prescribed morphine and fentanyl patches for air hunger but states she does not take this because she does not like the way it makes her feel.  Work-up in the ED showed vital signs with slightly soft blood pressures in 90s over 40s that are lower compared to her baseline,  stable hemoglobin since discharge at 8.4, and CMP showing increasing creatinine from 1.4 to 2.14 since discharge.  She was also found to be incidentally Covid positive.  She was admitted for management of AKI.  Subsequently developed respiratory failure as well.  Assessment & Plan:   Acute on Chronic Hypoxic Respiratory Failure 2/2 COVID 19 Pneumonia/concern for healthcare associated pneumonia This is a breakthrough infection.  Patient previously vaccinated.  At baseline she is on 2 L of oxygen.    CT chest with patchy bilateral basilar opacities concerning for multifocal pneumonia with small basilar effusions as well as enlarging L perihilar and lower lobe process concerning for worsening of disease in chest/disease recurrence of uncertain significance in current setting of pneumonia (see report) She was given RegenCov on 8/17. Patient has completed course of Remdesivir.  Steroids to be tapered gradually. Respiratory status has improved with oxygen requirements down to 2 to 3 L from a peak of 6 L. She was also started on antibacterials due to significant elevated procalcitonin of 12.31.  Cultures have been negative.  Procalcitonin level has improved.  Plan for 7-day course which should complete tomorrow. Patient was also noted to be wheezing extensively.  Patient does have a history of emphysema.  Patient was started on bronchodilators and inhaled steroids.  Wheezing has improved. Incentive spirometry, prone positioning, mobilization as much as possible.  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  Presented with creatinine of 2.14.  Was gently hydrated.  Renal function back to baseline.  Replace potassium.  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  There was a discussion of IVC filter placement at the last hospitalization, but  the risk of filter was thought to exceed benefit and the recommendation was for stopping anticoagulation and only recommending filter if recurrence of thrombotic disease. Lower extremity Doppler study done during this hospitalization shows indeterminate age DVT in the left leg.  This is thought to be an old DVT based on discussion with vascular surgery.  No anticoagulation at this time due to high risk of bleeding.  Currently on prophylactic doses of subcutaneous heparin.  Renal Cell Carcinoma  Tumor Thrombus Per discharge summary from last admission, patient went home on palliative care/hospice. Received palliative radiation on 8/9. She's currently under the care [facility]. Palliative care has been consulted to assist with goals of care. Plan is to continue current treatment without any escalation.  History of COPD on home oxygen at 2 L/min Stable.  Continue Combivent.  Dulera added today.  Essential hypertension Blood pressure is reasonably well controlled.  Continue diltiazem.    GERD Continue PPI  Macrocytic Anemia Hemoglobin stable for the most part.  No evidence of overt bleeding.  Transfuse if it drops below 7.  Anxiety/depression Continue mirtazapine  Constipation Abdominal film shows moderate stool burden.  Continue with laxatives.  Will give suppository today.    DVT prophylaxis: Subcutaneous Code Status: DNR Family Communication: Patient's husband was updated yesterday.  Will do so again today. Disposition: Plan is for patient to go back home with family and with hospice to follow.  Plan to discharge after she has completed 7 days of antibiotics.  Status is: Inpatient  Remains inpatient appropriate because:IV treatments appropriate due to intensity of illness or inability to take PO and Inpatient level of care appropriate due to severity of illness   Dispo:  Patient From: Home  Planned Disposition: Home  Expected discharge date: 11/11/19  Medically  stable for discharge: No      Consultants:   Palliative Care  Oncology  Procedures:   none  Antimicrobials:  Anti-infectives (From admission, onward)   Start     Dose/Rate Route Frequency Ordered Stop   11/05/19 1430  vancomycin (VANCOREADY) IVPB 500 mg/100 mL  Status:  Discontinued        500 mg 100 mL/hr over 60 Minutes Intravenous Every 24 hours 11/04/19 1241 11/05/19 1203   11/05/19 1430  vancomycin (VANCOREADY) IVPB 750 mg/150 mL        750 mg 150 mL/hr over 60 Minutes Intravenous Every 24 hours 11/05/19 1203 11/12/19 1429   11/05/19 1000  remdesivir 100 mg in sodium chloride 0.9 % 100 mL IVPB       "Followed by" Linked Group Details   100 mg 200 mL/hr over 30 Minutes Intravenous Daily 11/04/19 1239 11/08/19 0956   11/04/19 1500  remdesivir 200 mg in sodium chloride 0.9% 250 mL IVPB       "Followed by" Linked Group Details   200 mg 580 mL/hr over 30 Minutes Intravenous Once 11/04/19 1239 11/04/19 1449   11/04/19 1330  vancomycin (VANCOCIN) IVPB 1000 mg/200 mL premix        1,000 mg 200 mL/hr over 60 Minutes Intravenous  Once 11/04/19 1239 11/04/19 1428   11/04/19  1300  ceFEPIme (MAXIPIME) 2 g in sodium chloride 0.9 % 100 mL IVPB        2 g 200 mL/hr over 30 Minutes Intravenous Daily 11/04/19 1239 11/11/19 1359   11/04/19 1230  cefTRIAXone (ROCEPHIN) 2 g in sodium chloride 0.9 % 100 mL IVPB  Status:  Discontinued        2 g 200 mL/hr over 30 Minutes Intravenous Every 24 hours 11/04/19 1227 11/04/19 1229   11/04/19 1230  azithromycin (ZITHROMAX) tablet 500 mg        500 mg Oral Daily 11/04/19 1227 11/08/19 0913     Subjective: Patient states that overall she is better compared to before.  Still having some difficulty breathing once in a while.  No abdominal pain.  Passing gas from below.  Objective: Vitals:   11/09/19 1318 11/09/19 1500 11/09/19 2122 11/10/19 0601  BP: 129/69  130/70 130/66  Pulse: 97  93 84  Resp: 17  20 19   Temp: 98.5 F (36.9 C)  98.5 F  (36.9 C) 98.6 F (37 C)  TempSrc: Oral  Oral Oral  SpO2: 94% 97% 93% 91%  Weight:    57 kg  Height:        Intake/Output Summary (Last 24 hours) at 11/10/2019 1244 Last data filed at 11/10/2019 0600 Gross per 24 hour  Intake 720 ml  Output 700 ml  Net 20 ml   Filed Weights   11/08/19 0503 11/09/19 0603 11/10/19 0601  Weight: 57.2 kg 58.1 kg 57 kg    Examination:  General appearance: Awake alert.  In no distress Resp: Improved effort.  Less tachypneic compared to before.  Not using any accessory muscles.  Wheezing has significantly improved.   Cardio: S1-S2 is normal regular.  No S3-S4.  No rubs murmurs or bruit GI: Abdomen is soft.  Nontender nondistended.  Bowel sounds are present normal.  No masses organomegaly Extremities: No edema.  Full range of motion of lower extremities. Neurologic: No focal neurological deficits.     Data Reviewed: I have personally reviewed following labs and imaging studies  CBC: Recent Labs  Lab 11/03/19 1624 11/04/19 0445 11/05/19 0555 11/05/19 0555 11/06/19 0455 11/07/19 0500 11/08/19 0400 11/09/19 0430 11/10/19 0510  WBC 12.0*   < > 11.4*   < > 10.0 9.6 13.8* 14.2* 16.5*  NEUTROABS 10.4*  --  10.6*  --   --   --   --   --   --   HGB 8.4*   < > 8.3*   < > 8.0* 7.7* 8.6* 7.8* 7.7*  HCT 28.7*   < > 28.2*   < > 27.4* 26.5* 29.9* 25.9* 25.4*  MCV 101.8*   < > 100.7*   < > 99.6 100.0 102.7* 98.1 98.1  PLT 128*   < > 122*   < > 120* 109* 118* 121* 125*   < > = values in this interval not displayed.    Basic Metabolic Panel: Recent Labs  Lab 11/05/19 0555 11/05/19 0555 11/06/19 0455 11/07/19 0500 11/08/19 0400 11/09/19 0430 11/10/19 0510  NA 138   < > 138 140 142 140 142  K 4.5   < > 4.4 4.0 3.7 3.4* 3.5  CL 106   < > 106 107 109 105 107  CO2 20*   < > 22 23 19* 24 26  GLUCOSE 168*   < > 136* 193* 153* 242* 126*  BUN 35*   < > 42* 49* 46* 45* 42*  CREATININE 1.45*   < > 1.44* 1.44* 1.28* 1.25* 1.30*  CALCIUM 7.5*   < > 7.7*  8.0* 7.9* 7.9* 7.9*  MG 1.9  --   --   --   --   --   --   PHOS 4.1  --   --   --   --   --   --    < > = values in this interval not displayed.    GFR: Estimated Creatinine Clearance: 35.2 mL/min (A) (by C-G formula based on SCr of 1.3 mg/dL (H)).  Liver Function Tests: Recent Labs  Lab 11/06/19 0455 11/07/19 0500 11/08/19 0400 11/09/19 0430 11/10/19 0510  AST 51* 44* 58* 54* 61*  ALT 43 40 48* 51* 57*  ALKPHOS 264* 232* 228* 186* 169*  BILITOT 0.6 0.4 0.3 0.7 0.5  PROT 5.3* 5.0* 5.3* 4.8* 4.7*  ALBUMIN 2.4* 2.4* 2.5* 2.4* 2.3*    CBG: Recent Labs  Lab 11/04/19 0843 11/04/19 0927 11/04/19 1305  GLUCAP 66* 94 103*     Recent Results (from the past 240 hour(s))  SARS Coronavirus 2 by RT PCR (hospital order, performed in [facility] hospital lab) Nasopharyngeal Nasopharyngeal Swab     Status: Abnormal   Collection Time: 11/03/19  4:40 PM   Specimen: Nasopharyngeal Swab  Result Value Ref Range Status   SARS Coronavirus 2 POSITIVE (A) NEGATIVE Final    Comment: RESULT CALLED TO, READ BACK BY AND VERIFIED WITH: C.Rosine Abe [id number] @1859  BY V.WILKINS (NOTE) SARS-CoV-2 target nucleic acids are DETECTED  SARS-CoV-2 RNA is generally detectable in upper respiratory specimens  during the acute phase of infection.  Positive results are indicative  of the presence of the identified virus, but do not rule out bacterial infection or co-infection with other pathogens not detected by the test.  Clinical correlation with patient history and  other diagnostic information is necessary to determine patient infection status.  The expected result is negative.  Fact Sheet for Patients:   StrictlyIdeas.no   Fact Sheet for Healthcare Providers:   BankingDealers.co.za    This test is not yet approved or cleared by the [place] FDA and  has been authorized for detection and/or diagnosis of SARS-CoV-2 by FDA under an Emergency Use  Authorization (EUA).  This EUA will remain in effect (meaning th is test can be used) for the duration of  the COVID-19 declaration under Section 564(b)(1) of the Act, 21 U.S.C. section 360-bbb-3(b)(1), unless the authorization is terminated or revoked sooner.  Performed at [facility], [place] [address], [place], [place] [address]   Culture, blood (routine x 2) Call MD if unable to obtain prior to antibiotics being given     Status: None   Collection Time: 11/04/19  1:10 PM   Specimen: Left Antecubital; Blood  Result Value Ref Range Status   Specimen Description   Final    LEFT ANTECUBITAL Performed at [facility] [address], [place], [place] [address]    Special Requests   Final    BOTTLES DRAWN AEROBIC AND ANAEROBIC Blood Culture results may not be optimal due to an excessive volume of blood received in culture bottles Performed at [facility] [address] [place], [place] [address]    Culture   Final    NO GROWTH 5 DAYS Performed at [facility], [place] [address], [place], [place] [address]    Report Status 11/09/2019 FINAL  Final  Culture, blood (routine x  2) Call MD if unable to obtain prior to antibiotics being given     Status: None   Collection Time: 11/04/19  1:20 PM   Specimen: Right Antecubital; Blood  Result Value Ref Range Status   Specimen Description   Final    RIGHT ANTECUBITAL Performed at [facility] [address] [place], [place] [address]    Special Requests   Final    BOTTLES DRAWN AEROBIC AND ANAEROBIC Blood Culture adequate volume Performed at [facility] [address] [place], [place] [address]    Culture   Final    NO GROWTH 5 DAYS Performed at [facility], [place] [address], [place], [place] [address]    Report Status 11/09/2019 FINAL  Final  MRSA PCR Screening     Status: Abnormal   Collection Time: 11/05/19 12:55 PM    Specimen: Nasal Mucosa; Nasopharyngeal  Result Value Ref Range Status   MRSA by PCR POSITIVE (A) NEGATIVE Final    Comment:        The GeneXpert MRSA Assay (FDA approved for NASAL specimens only), is one component of a comprehensive MRSA colonization surveillance program. It is not intended to diagnose MRSA infection nor to guide or monitor treatment for MRSA infections. RESULT CALLED TO, READ BACK BY AND VERIFIED WITH: H PARISH AT 5462 ON 11/05/2019 BY MOSLEY,J  Performed at [facility], [place] [address], [place], [place] [address]          Radiology Studies: DG Abd Portable 1V  Result Date: 11/09/2019 CLINICAL DATA:  Abdominal pain, EXAM: PORTABLE ABDOMEN - 1 VIEW COMPARISON:  Chest x-ray obtained yesterday; prior CT scan of the chest 11/04/2019 FINDINGS: Stable appearance of the lower lungs. Triangular opacity overlying the left lower lobe appears similar compared to prior imaging. The bowel gas pattern is not obstructed. Moderate colonic stool burden. Surgical clips present in the right hemiabdomen. No evidence of free air. No suspicious calcifications. Several small vascular phleboliths are identified. No acute osseous abnormality. IMPRESSION: 1. Nonobstructive bowel gas pattern. 2. Moderate colonic stool burden. 3. Stable left lower lobe airspace opacity. Electronically Signed   By: Jacqulynn Cadet M.D.   On: 11/09/2019 14:26     Scheduled Meds: . brinzolamide  1 drop Both Eyes TID  . Chlorhexidine Gluconate Cloth  6 each Topical Daily  . diltiazem  120 mg Oral Daily  . heparin injection (subcutaneous)  5,000 Units Subcutaneous Q12H  . Ipratropium-Albuterol  4 puff Inhalation QID  . lactose free nutrition  237 mL Oral TID WC  . latanoprost  1 drop Both Eyes QHS  . mirtazapine  7.5 mg Oral Daily  . mometasone-formoterol  2 puff Inhalation BID  . multivitamin with minerals  1 tablet Oral Daily  . pantoprazole  80 mg Oral Q1200  . polyethylene glycol   17 g Oral BID  . predniSONE  60 mg Oral Q breakfast  . senna  2 tablet Oral BID  . sodium chloride flush  10-40 mL Intracatheter Q12H   Continuous Infusions: . sodium chloride 10 mL/hr at 11/06/19 0600  . ceFEPime (MAXIPIME) IV 2 g (11/09/19 1516)  . vancomycin 750 mg (11/09/19 1641)     LOS: 6 days    Bonnielee Haff, MD [facility]   To contact the attending provider between 7A-7P or the covering provider during after hours 7P-7A, please log into the web site [email or web address] and access using universal [place] password for that web site.  If you do not have the password, please call the hospital operator.  11/10/2019, 12:44 PM

## 2019-11-11 LAB — CREATININE, SERUM
Creatinine, Ser: 1.01 mg/dL — ABNORMAL HIGH (ref 0.44–1.00)
GFR calc Af Amer: >60 mL/min (ref >60)
GFR calc non Af Amer: 56 mL/min — ABNORMAL LOW (ref >60)

## 2019-11-11 MED ORDER — HEPARIN SOD (PORK) LOCK FLUSH 100 UNIT/ML IV SOLN
500.0000 [IU] | INTRAVENOUS | Status: AC | PRN
  Administered 2019-11-11: 500 [IU]
  Filled 2019-11-11: qty 5

## 2019-11-11 MED ORDER — DULERA 200-5 MCG/ACT IN AERO: 2.0000 | INHALATION_SPRAY | Freq: Two times a day (BID) | RESPIRATORY_TRACT | 1 refills | Status: AC

## 2019-11-11 MED ORDER — POLYETHYLENE GLYCOL 3350 17 G PO PACK: 17.0000 g | PACK | Freq: Every day | ORAL | 0 refills | Status: AC

## 2019-11-11 MED ORDER — PREDNISONE 20 MG PO TABS: ORAL_TABLET | ORAL | 0 refills | Status: AC

## 2019-11-11 MED ORDER — PREDNISONE 10 MG PO TABS: 10.0000 mg | ORAL_TABLET | Freq: Every day | ORAL | Status: AC

## 2019-11-11 NOTE — TOC Transition Note (Signed)
Transition of Care [facility]) - CM/SW Discharge Note   Patient Details  Name: [name] MRN: [id number] Date of Birth: 1945-08-12  Transition of Care [facility]) CM/SW Contact:  Trish Mage, LCSW Phone Number: 11/11/2019, 3:26 PM   Clinical Narrative:  Patient to transition home today with the support of [place].  [facility] transportation arranged. TOC sign off.     Final next level of care: Home w Hospice Care Barriers to Discharge: Barriers Resolved   Patient Goals and CMS Choice        Discharge Placement                       Discharge Plan and Services                                     Social Determinants of Health (SDOH) Interventions     Readmission Risk Interventions No flowsheet data found.

## 2019-11-11 NOTE — Discharge Summary (Signed)
[facility]  Physician Discharge Summary   Patient ID: Melanie Best MRN: [id number] DOB/AGE: 1945/05/28 74 y.o.  Admit date: 11/03/2019 Discharge date: 11/11/2019  PCP: [name], MD  DISCHARGE DIAGNOSES:  Acute on chronic respiratory failure with hypoxia Pneumonia due to [place] associated pneumonia Chronic kidney disease stage IIIb History of renal cell carcinoma with tumor thrombus Under hospice care at home History of COPD on home oxygen Essential hypertension History of GERD Macrocytic anemia Constipation  RECOMMENDATIONS FOR OUTPATIENT FOLLOW UP: 1. Hospice services to be resumed at home.   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  She was admitted from August 4 to August 7 after being sent from her hematologist office with finding of new acute blood loss anemia secondary to gross hematuria from her known renal cell carcinoma and anticoagulation with Xarelto. During admission she was transfused 2 units PRBC with her hemoglobin rising to 9.3 at discharge.Her Xarelto was held at discharge given her poor prognosis there is no further plan for anticoagulation. She was also seen by palliative care with plan to go home on palliative versus hospice, and was made DNR at that time.  She presented to the ED forfatigue and lack of appetite. She lives with her husband and her son, her son has been admitted to the hospital due to Covid in the past week. She reports that she has received her Covid vaccine series back in March, she endorses a chronic shortness of breath but states that it is not any  different than how she usually feels. She has been on her usual 2 L of oxygen without needing additional oxygen.  With regards to hospice she is unsure of details, but states that a hospital bed was brought out to her home. Review of chart also shows that she received palliative radiation on August 9. At discharge she was prescribed morphine and fentanyl patches for air hunger but states she does not take this because she does not like the way it makes her feel.  Work-up in the ED showed vital signs with slightly soft blood pressures in 90s over 40s that are lower compared to her baseline, stable hemoglobin since discharge at 8.4, and CMP showing increasing creatinine from 1.4to2.14 since discharge.She was also found to be incidentally Covid positive.  She was admitted for management of AKI.  Subsequently developed respiratory failure as well.   HOSPITAL COURSE:   Acute on Chronic Hypoxic Respiratory Failure 2/2 COVID 19 Pneumonia/concern for healthcare associated pneumonia This is a breakthrough infection.  Patient previously vaccinated.  At baseline she is on 2 L of oxygen.    CT chest with patchy bilateral basilar opacities concerning for multifocal pneumonia with small basilar effusions as well as enlarging L perihilar and lower lobe process concerning for worsening of disease in chest/disease recurrence of uncertain significance in current setting of pneumonia (see report) She was given RegenCov on 8/17. Patient has completed course of Remdesivir.  Steroids to be tapered gradually. Respiratory status has improved with oxygen requirements down to 2 to 3 L from a peak of 6 L. She was also started on antibacterials due to significant elevated procalcitonin of 12.31.  Cultures have been negative.  Procalcitonin level  has improved.    She was given a 7-day course of vancomycin and cefepime. Patient was also noted to be wheezing extensively.  Patient does have a history of emphysema.   Patient was started on bronchodilators and inhaled steroids.  Wheezing has improved.  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  Presented with creatinine of 2.14.  Was gently hydrated.  Renal function back to baseline.    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  There was a discussion of IVC filter placement at the last hospitalization, but the risk of filter was thought to exceed benefit and the recommendation was for stopping anticoagulation and only recommending filter if recurrence of thrombotic disease. Lower extremity Doppler study done during this hospitalization shows indeterminate age DVT in the left leg.  This is thought to be an old DVT based on discussion with vascular surgery.  No anticoagulation at this time due to high risk of bleeding.    Renal Cell Carcinoma  Tumor Thrombus Received palliative radiation on 8/9. She's currently under the care [facility] hosp[facility]. Palliative care has been consulted to assist with goals of care. Plan is to continue current treatment without any escalation.  History of COPD on home oxygen at 2 L/min Stable.  Continue Combivent and inhaled steroid.  Essential hypertension Blood pressure is reasonably well controlled.  Continue diltiazem.    GERD Continue PPI  Macrocytic Anemia Hemoglobin stable for the most part.  No evidence of overt bleeding.    Anxiety/depression Continue mirtazapine  Constipation Abdominal film shows moderate stool burden.  She did have bowel movement with laxatives and enemas.  Overall stable.  Okay for discharge home today.  Discussed with patient and her husband.    PERTINENT LABS:  The results of significant diagnostics from this hospitalization (including imaging, microbiology, ancillary and laboratory) are listed below for  reference.    Microbiology: Recent Results (from the past 240 hour(s))  SARS Coronavirus 2 by RT PCR (hospital order, performed in [facility] hospital lab) Nasopharyngeal Nasopharyngeal Swab     Status: Abnormal   Collection Time: 11/03/19  4:40 PM   Specimen: Nasopharyngeal Swab  Result Value Ref Range Status   SARS Coronavirus 2 POSITIVE (A) NEGATIVE Final    Comment: RESULT CALLED TO, READ BACK BY AND VERIFIED WITH: C.Rosine Abe [id number] @[id number]  BY V.WILKINS (NOTE) SARS-CoV-2 target nucleic acids are DETECTED  SARS-CoV-2 RNA is generally detectable in upper respiratory specimens  during the acute phase of infection.  Positive results are indicative  of the presence of the identified virus, but do not rule out bacterial infection or co-infection with other pathogens not detected by the test.  Clinical correlation with patient history and  other diagnostic information is necessary to determine patient infection status.  The expected result is negative.  Fact Sheet for Patients:   StrictlyIdeas.no   Fact Sheet for Healthcare Providers:   BankingDealers.co.za    This test is not yet approved or cleared by the [place] FDA and  has been authorized for detection and/or diagnosis of SARS-CoV-2 by FDA under an Emergency Use Authorization (EUA).  This EUA will remain in effect (meaning th is test can be used) for the duration of  the COVID-19 declaration under Section 564(b)(1) of the Act, 21 U.S.C. section 360-bbb-3(b)(1), unless the authorization is terminated or revoked sooner.  Performed at [facility], [place] [address], [place], [place] [address]   Culture, blood (routine x 2) Call MD if unable to obtain prior to antibiotics being given     Status: None   Collection Time: 11/04/19  1:10 PM   Specimen: Left Antecubital; Blood  Result Value Ref Range Status   Specimen Description   Final    LEFT  ANTECUBITAL Performed at [place] [address], [place], [place] [address]    Special Requests   Final    BOTTLES DRAWN AEROBIC AND ANAEROBIC Blood Culture results may not be optimal due to an excessive volume of blood received in culture bottles Performed at [place] [address] [place], [place] [address]    Culture   Final    NO GROWTH 5 DAYS Performed at [facility], [employer] [address] [place], [place] [address]    Report Status 11/09/2019 FINAL  Final  Culture, blood (routine x 2) Call MD if unable to obtain prior to antibiotics being given     Status: None   Collection Time: 11/04/19  1:20 PM   Specimen: Right Antecubital; Blood  Result Value Ref Range Status   Specimen Description   Final    RIGHT ANTECUBITAL Performed at [place] [address], [place], [place] [address]    Special Requests   Final    BOTTLES DRAWN AEROBIC AND ANAEROBIC Blood Culture adequate volume Performed at [place] [address], [place], [place] [address]    Culture   Final    NO GROWTH 5 DAYS Performed at [facility], [place] [address] [place], [place] [address]    Report Status 11/09/2019 FINAL  Final  MRSA PCR Screening     Status: Abnormal   Collection Time: 11/05/19 12:55 PM   Specimen: Nasal Mucosa; Nasopharyngeal  Result Value Ref Range Status   MRSA by PCR POSITIVE (A) NEGATIVE Final    Comment:        The GeneXpert MRSA Assay (FDA approved for NASAL specimens only), is one component of a comprehensive MRSA colonization surveillance program. It is not intended to diagnose MRSA infection nor to guide or monitor treatment for MRSA infections. RESULT CALLED TO, READ BACK BY AND VERIFIED WITH: H PARISH AT 6283 ON 11/05/2019 BY MOSLEY,J  Performed at [facility], [place] [address], [place], [place] [address]      Labs:  COVID-19 Labs   Lab Results   Component Value Date   SARSCOV2NAA POSITIVE (A) 11/03/2019   [place] NEGATIVE 10/22/2019      Basic Metabolic Panel: Recent Labs  Lab 11/05/19 0555 11/05/19 0555 11/06/19 0455 11/06/19 0455 11/07/19 0500 11/08/19 0400 11/09/19 0430 11/10/19 0510 11/11/19 0451  NA 138   < > 138  --  140 142 140 142  --   K 4.5   < > 4.4  --  4.0 3.7 3.4* 3.5  --   CL 106   < > 106  --  107 109 105 107  --   CO2 20*   < > 22  --  23 19* 24 26  --   GLUCOSE 168*   < > 136*  --  193* 153* 242* 126*  --   BUN 35*   < > 42*  --  49* 46* 45* 42*  --   CREATININE 1.45*   < > 1.44*   < > 1.44*  1.28* 1.25* 1.30* 1.01*  CALCIUM 7.5*   < > 7.7*  --  8.0* 7.9* 7.9* 7.9*  --   MG 1.9  --   --   --   --   --   --   --   --   PHOS 4.1  --   --   --   --   --   --   --   --    < > = values in this interval not displayed.   Liver Function Tests: Recent Labs  Lab 11/06/19 0455 11/07/19 0500 11/08/19 0400 11/09/19 0430 11/10/19 0510  AST 51* 44* 58* 54* 61*  ALT 43 40 48* 51* 57*  ALKPHOS 264* 232* 228* 186* 169*  BILITOT 0.6 0.4 0.3 0.7 0.5  PROT 5.3* 5.0* 5.3* 4.8* 4.7*  ALBUMIN 2.4* 2.4* 2.5* 2.4* 2.3*   CBC: Recent Labs  Lab 11/05/19 0555 11/05/19 0555 11/06/19 0455 11/07/19 0500 11/08/19 0400 11/09/19 0430 11/10/19 0510  WBC 11.4*   < > 10.0 9.6 13.8* 14.2* 16.5*  NEUTROABS 10.6*  --   --   --   --   --   --   HGB 8.3*   < > 8.0* 7.7* 8.6* 7.8* 7.7*  HCT 28.2*   < > 27.4* 26.5* 29.9* 25.9* 25.4*  MCV 100.7*   < > 99.6 100.0 102.7* 98.1 98.1  PLT 122*   < > 120* 109* 118* 121* 125*   < > = values in this interval not displayed.     IMAGING STUDIES CT CHEST WO CONTRAST  Addendum Date: 11/08/2019   ADDENDUM REPORT: 11/08/2019 12:43 ADDENDUM: For clarification, enlarging perihilar opacity and peribronchovascular nodularity is compatible with worsening disease. Area of nodularity along the bronchovascular structures on image 102 of series 2 measures approximately 2.1 cm. The  greatest axial dimension of any area extending from the opacity in the LEFT mid chest on the prior study is approximately 1.7 cm. Enlargement of LEFT infrahilar mass/soft tissue as described in the initial report. Given the presence of superimposed infection the full extent of enlarging metastatic lesions in the LEFT chest is slightly limited. Consider attention to this area on subsequent imaging following treatment of presumed pneumonia. Electronically Signed   By: Zetta Bills M.D.   On: 11/08/2019 12:43   Result Date: 11/08/2019 CLINICAL DATA:  Respiratory failure. Malaise. Patient with history of bilateral renal cell carcinoma. EXAM: CT CHEST WITHOUT CONTRAST TECHNIQUE: Multidetector CT imaging of the chest was performed following the standard protocol without IV contrast. COMPARISON:  March 26, 2019 FINDINGS: Cardiovascular: RIGHT-sided Port-A-Cath terminates at the caval to atrial junction. Calcified atheromatous plaque of the thoracic aorta without aneurysmal dilation. Normal caliber central pulmonary vasculature. Normal heart size. Small pericardial effusion unchanged from previous exam in terms of volume. Calcified coronary artery disease. Mediastinum/Nodes: No thoracic inlet adenopathy. No mediastinal lymphadenopathy. No axillary lymphadenopathy. Esophagus grossly normal. Hilar structures with limited assessment due to lack of intravenous contrast. Lungs/Pleura: Background of severe pulmonary emphysema with bandlike scarring in the RIGHT upper lobe extending to the periphery Patchy bilateral basilar opacities, interval development of bronchial wall thickening at the lung bases. LEFT perihilar lesion measuring 3.9 x 3.3 cm with nodularity tracking along the bronchovascular bundle that has increased to a similar extent tracking into the medial LEFT lung base. Area previously measuring 3.3 x 2.3 cm Upper Abdomen: Gallbladder distension with cholelithiasis. No pericholecystic stranding though the  gallbladder is incompletely imaged. Incomplete imaging of the kidneys with  mass approximately 7.1 x 7.1 cm in the interpolar LEFT kidney invading the expanded LEFT renal vein. This area measured approximately 6.1 x 5.9 cm on the prior study. Renal venous caliber proximally 2.8 cm on the current study previously approximately 1.8 cm. Musculoskeletal: No acute musculoskeletal process to the extent evaluated. No destructive bone finding IMPRESSION: 1. Patchy bilateral basilar opacities, interval development of bronchial wall thickening at the lung bases, concerning for multifocal pneumonia, associated with small basilar effusions. 2. Enlarging LEFT perihilar and lower lobe process suspicious for worsening of disease in the chest/disease recurrence of uncertain significance in the current setting of suspected pneumonia. Close attention on follow-up is suggested. 3. Post treatment changes in the RIGHT upper lobe are similar to the prior exam. 4. Suspected worsening of LEFT renal mass and renal venous invasion. 5. Cholelithiasis with gallbladder distension that is grossly similar to previous imaging but incompletely imaged. 6. Calcified coronary artery disease. 7. Emphysema and aortic atherosclerosis. Aortic Atherosclerosis (ICD10-I70.0) and Emphysema (ICD10-J43.9). Electronically Signed: By: Zetta Bills M.D. On: 11/04/2019 11:11   DG Chest Port 1 View  Result Date: 11/08/2019 CLINICAL DATA:  74 year old female with history of pneumonia from COVID infection. EXAM: PORTABLE CHEST 1 VIEW COMPARISON:  Chest x-ray 11/03/2019. FINDINGS: Right internal jugular single-lumen porta cath with tip terminating in the distal superior vena cava. Lung volumes are increased with emphysematous changes. Some linear scarring is noted in the right upper lobe and left mid lung, similar to prior examinations. Focal left lower lobe opacity in the retrocardiac region somewhat mass-like in appearance. No airspace consolidation in the right  lung. No pleural effusions. No evidence of pulmonary edema. No pneumothorax. Skin fold artifact over the mid to lower left hemithorax. Heart size is normal. Upper mediastinal contours are within normal limits. Aortic atherosclerosis. IMPRESSION: 1. Support apparatus, as above. 2. Focal mass-like opacity in the left lower lobe which may reflect pneumonia, although underlying neoplasm is not excluded. Correlation with prior chest CT is recommended. 3. Aortic atherosclerosis. 4. Emphysema. Electronically Signed   By: Vinnie Langton M.D.   On: 11/08/2019 07:31   DG Chest Port 1 View  Result Date: 11/03/2019 CLINICAL DATA:  Dyspnea, COPD, kidney cancer EXAM: PORTABLE CHEST 1 VIEW COMPARISON:  08/21/2017 chest radiograph. FINDINGS: Right internal jugular Port-A-Cath terminates over cavoatrial junction. Stable cardiomediastinal silhouette with normal heart size. No pneumothorax. No pleural effusion. Emphysema. No pulmonary edema. Stable upper right parahilar parenchymal band. New thin parenchymal band in infrahilar left lung. No acute consolidative airspace disease. IMPRESSION: Emphysema. New mild linear scarring versus atelectasis in the infrahilar left lung. No acute consolidative airspace disease to suggest a pneumonia. Electronically Signed   By: Ilona Sorrel M.D.   On: 11/03/2019 17:53   DG Abd Portable 1V  Result Date: 11/09/2019 CLINICAL DATA:  Abdominal pain, EXAM: PORTABLE ABDOMEN - 1 VIEW COMPARISON:  Chest x-ray obtained yesterday; prior CT scan of the chest 11/04/2019 FINDINGS: Stable appearance of the lower lungs. Triangular opacity overlying the left lower lobe appears similar compared to prior imaging. The bowel gas pattern is not obstructed. Moderate colonic stool burden. Surgical clips present in the right hemiabdomen. No evidence of free air. No suspicious calcifications. Several small vascular phleboliths are identified. No acute osseous abnormality. IMPRESSION: 1. Nonobstructive bowel gas  pattern. 2. Moderate colonic stool burden. 3. Stable left lower lobe airspace opacity. Electronically Signed   By: Jacqulynn Cadet M.D.   On: 11/09/2019 14:26   VAS [place] LOWER EXTREMITY VENOUS (DVT)  Result Date: 11/04/2019  Lower Venous DVTStudy Indications: Elevated Ddimer.  Risk Factors: COVID 19 positive Cancer DVT. Comparison Study: No prior studies. Performing Technologist: Oliver Hum RVT  Examination Guidelines: A complete evaluation includes B-mode imaging, spectral Doppler, color Doppler, and power Doppler as needed of all accessible portions of each vessel. Bilateral testing is considered an integral part of a complete examination. Limited examinations for reoccurring indications may be performed as noted. The reflux portion of the exam is performed with the patient in reverse Trendelenburg.  +---------+---------------+---------+-----------+----------+--------------+ RIGHT    CompressibilityPhasicitySpontaneityPropertiesThrombus Aging +---------+---------------+---------+-----------+----------+--------------+ CFV      Full           Yes      Yes                                 +---------+---------------+---------+-----------+----------+--------------+ SFJ      Full                                                        +---------+---------------+---------+-----------+----------+--------------+ FV Prox  Full                                                        +---------+---------------+---------+-----------+----------+--------------+ FV Mid   Full                                                        +---------+---------------+---------+-----------+----------+--------------+ FV DistalFull                                                        +---------+---------------+---------+-----------+----------+--------------+ PFV      Full                                                         +---------+---------------+---------+-----------+----------+--------------+ POP      Full           Yes      Yes                                 +---------+---------------+---------+-----------+----------+--------------+ PTV      Full                                                        +---------+---------------+---------+-----------+----------+--------------+ PERO     Full                                                        +---------+---------------+---------+-----------+----------+--------------+   +---------+---------------+---------+-----------+----------+-----------------+  LEFT     CompressibilityPhasicitySpontaneityPropertiesThrombus Aging    +---------+---------------+---------+-----------+----------+-----------------+ CFV      Full           Yes      Yes                                    +---------+---------------+---------+-----------+----------+-----------------+ SFJ      Full                                                           +---------+---------------+---------+-----------+----------+-----------------+ FV Prox  Partial        Yes      Yes                  Age Indeterminate +---------+---------------+---------+-----------+----------+-----------------+ FV Mid   Full                                                           +---------+---------------+---------+-----------+----------+-----------------+ FV DistalFull                                                           +---------+---------------+---------+-----------+----------+-----------------+ PFV      Full                                                           +---------+---------------+---------+-----------+----------+-----------------+ POP      Partial        Yes      Yes                  Age Indeterminate +---------+---------------+---------+-----------+----------+-----------------+ PTV      None                                         Age  Indeterminate +---------+---------------+---------+-----------+----------+-----------------+ PERO     Full                                                           +---------+---------------+---------+-----------+----------+-----------------+     Summary: RIGHT: - There is no evidence of deep vein thrombosis in the lower extremity.  - No cystic structure found in the popliteal fossa.  LEFT: - Findings consistent with age indeterminate deep vein thrombosis involving the left femoral vein, left popliteal vein, and left posterior tibial veins. - No cystic structure found in the popliteal fossa.  *See table(s) above for measurements and observations. Electronically signed by Deitra Mayo MD on  11/04/2019 at 5:09:43 PM.    Final     DISCHARGE EXAMINATION: Vitals:   11/10/19 0601 11/10/19 2000 11/11/19 0346 11/11/19 0500  BP: 130/66 (!) 146/71 132/60   Pulse: 84 96 84   Resp: 19 18 18    Temp: 98.6 F (37 C) 98.9 F (37.2 C) 98.4 F (36.9 C)   TempSrc: Oral Oral Oral   SpO2: 91% 97% 96%   Weight: 57 kg   58.5 kg  Height:       General appearance: Awake alert.  In no distress Resp: Improved aeration.  Improved effort.  Less wheezing.  Few crackles at the bases. Cardio: S1-S2 is normal regular.  No S3-S4.  No rubs murmurs or bruit GI: Abdomen is soft.  Nontender nondistended.  Bowel sounds are present normal.  No masses organomegaly   DISPOSITION: Home  Discharge Instructions    Call MD for:  difficulty breathing, headache or visual disturbances   Complete by: As directed    Call MD for:  extreme fatigue   Complete by: As directed    Call MD for:  persistant dizziness or light-headedness   Complete by: As directed    Call MD for:  persistant nausea and vomiting   Complete by: As directed    Call MD for:  severe uncontrolled pain   Complete by: As directed    Call MD for:  temperature >100.4   Complete by: As directed    Diet - low sodium heart healthy   Complete by: As  directed    Increase activity slowly   Complete by: As directed         Allergies as of 11/11/2019   No Known Allergies     Medication List    STOP taking these medications   fentaNYL 12 MCG/HR Commonly known as: DURAGESIC   morphine 10 MG/5ML solution     TAKE these medications   acetaminophen 325 MG tablet Commonly known as: TYLENOL Take 2 tablets (650 mg total) by mouth every 6 (six) hours as needed for mild pain (or Fever >/= 101).   Azopt 1 % ophthalmic suspension Generic drug: brinzolamide Place 1 drop into both eyes 3 (three) times daily. Reported on 08/12/2015   bisacodyl 10 MG suppository Commonly known as: DULCOLAX Place 1 suppository (10 mg total) rectally as needed for moderate constipation.   diltiazem 180 MG 24 hr capsule Commonly known as: CARDIZEM CD Take 180 mg by mouth daily.   Dulera 200-5 MCG/ACT Aero Generic drug: mometasone-formoterol Inhale 2 puffs into the lungs in the morning and at bedtime.   ergocalciferol 1.25 MG (50000 UT) capsule Commonly known as: VITAMIN D2 Take 50,000 Units by mouth 2 (two) times a week. Take on Monday & Thursday   esomeprazole 40 MG capsule Commonly known as: NEXIUM Take 40 mg by mouth daily.   feeding supplement (KATE FARMS STANDARD 1.4) Liqd liquid Take 325 mLs by mouth 2 (two) times daily between meals.   (feeding supplement) PROSource Plus liquid Take 30 mLs by mouth 2 (two) times daily between meals.   ferrous sulfate 325 (65 FE) MG EC tablet Take 1 tablet (325 mg total) by mouth 2 (two) times daily.   ipratropium-albuterol 0.5-2.5 (3) MG/3ML Soln Commonly known as: DUONEB Take 3 mLs by nebulization 4 (four) times daily as needed. What changed: reasons to take this   lidocaine-prilocaine cream Commonly known as: EMLA Apply 1 application topically as needed (port access).   Lumigan 0.01 % Soln Generic drug:  bimatoprost Place 1 drop into both eyes at bedtime.   mirtazapine 7.5 MG  tablet Commonly known as: REMERON Take 1 tablet (7.5 mg total) by mouth daily.   multivitamin with minerals Tabs tablet Take 1 tablet by mouth daily.   nystatin 100000 UNIT/ML suspension Commonly known as: MYCOSTATIN Take 5 mLs (500,000 Units total) by mouth 4 (four) times daily.   ondansetron 4 MG tablet Commonly known as: ZOFRAN Take 1 tablet (4 mg total) by mouth every 6 (six) hours as needed for nausea.   oxyCODONE 5 MG immediate release tablet Commonly known as: Oxy IR/ROXICODONE Take 5 mg by mouth every 4 (four) hours as needed for pain.   OXYGEN Inhale 2 L into the lungs continuous.   polyethylene glycol 17 g packet Commonly known as: MIRALAX / GLYCOLAX Take 17 g by mouth daily. What changed:   when to take this  reasons to take this   predniSONE 20 MG tablet Commonly known as: DELTASONE Take 3 tablets once daily for 3 days followed by 2 tablets once daily for 3 days followed by 1 tablet once daily as before. What changed: You were already taking a medication with the same name, and this prescription was added. Make sure you understand how and when to take each.   predniSONE 10 MG tablet Commonly known as: DELTASONE Take 1 tablet (10 mg total) by mouth daily with breakfast. Resume after you have completed the other prescription for prednisone Start taking on: November 18, 2019 What changed:   additional instructions  These instructions start on November 18, 2019. If you are unsure what to do until then, ask your doctor or other care provider.   senna 8.6 MG Tabs tablet Commonly known as: SENOKOT Take 2 tablets (17.2 mg total) by mouth at bedtime.         Follow-up Information    [name], MD. Schedule an appointment as soon as possible for a visit in 1 week(s).   Specialty: Internal Medicine Contact information: [address], [address] [address] [phone]               TOTAL DISCHARGE TIME: 35 minutes  [facility] Pager on [email or web address]  11/11/2019, 6:38 PM

## 2019-11-11 NOTE — Discharge Instructions (Signed)
COVID-19 COVID-19 is a respiratory infection that is caused by a virus called severe acute respiratory syndrome coronavirus 2 (SARS-CoV-2). The disease is also known as coronavirus disease or novel coronavirus. In some people, the virus may not cause any symptoms. In others, it may cause a serious infection. The infection can get worse quickly and can lead to complications, such as:  Pneumonia, or infection of the lungs.  Acute respiratory distress syndrome or ARDS. This is a condition in which fluid build-up in the lungs prevents the lungs from filling with air and passing oxygen into the blood.  Acute respiratory failure. This is a condition in which there is not enough oxygen passing from the lungs to the body or when carbon dioxide is not passing from the lungs out of the body.  Sepsis or septic shock. This is a serious bodily reaction to an infection.  Blood clotting problems.  Secondary infections due to bacteria or fungus.  Organ failure. This is when your body's organs stop working. The virus that causes COVID-19 is contagious. This means that it can spread from person to person through droplets from coughs and sneezes (respiratory secretions). What are the causes? This illness is caused by a virus. You may catch the virus by:  Breathing in droplets from an infected person. Droplets can be spread by a person breathing, speaking, singing, coughing, or sneezing.  Touching something, like a table or a doorknob, that was exposed to the virus (contaminated) and then touching your mouth, nose, or eyes. What increases the risk? Risk for infection You are more likely to be infected with this virus if you:  Are within 6 feet (2 meters) of a person with COVID-19.  Provide care for or live with a person who is infected with COVID-19.  Spend time in crowded indoor spaces or live in shared housing. Risk for serious illness You are more likely to become seriously ill from the virus if you:   Are 50 years of age or older. The higher your age, the more you are at risk for serious illness.  Live in a nursing home or long-term care facility.  Have cancer.  Have a long-term (chronic) disease such as: ? Chronic lung disease, including chronic obstructive pulmonary disease or asthma. ? A long-term disease that lowers your body's ability to fight infection (immunocompromised). ? Heart disease, including heart failure, a condition in which the arteries that lead to the heart become narrow or blocked (coronary artery disease), a disease which makes the heart muscle thick, weak, or stiff (cardiomyopathy). ? Diabetes. ? Chronic kidney disease. ? Sickle cell disease, a condition in which red blood cells have an abnormal "sickle" shape. ? Liver disease.  Are obese. What are the signs or symptoms? Symptoms of this condition can range from mild to severe. Symptoms may appear any time from 2 to 14 days after being exposed to the virus. They include:  A fever or chills.  A cough.  Difficulty breathing.  Headaches, body aches, or muscle aches.  Runny or stuffy (congested) nose.  A sore throat.  New loss of taste or smell. Some people may also have stomach problems, such as nausea, vomiting, or diarrhea. Other people may not have any symptoms of COVID-19. How is this diagnosed? This condition may be diagnosed based on:  Your signs and symptoms, especially if: ? You live in an area with a COVID-19 outbreak. ? You recently traveled to or from an area where the virus is common. ? You   provide care for or live with a person who was diagnosed with COVID-19. ? You were exposed to a person who was diagnosed with COVID-19.  A physical exam.  Lab tests, which may include: ? Taking a sample of fluid from the back of your nose and throat (nasopharyngeal fluid), your nose, or your throat using a swab. ? A sample of mucus from your lungs (sputum). ? Blood tests.  Imaging tests, which  may include, X-rays, CT scan, or ultrasound. How is this treated? At present, there is no medicine to treat COVID-19. Medicines that treat other diseases are being used on a trial basis to see if they are effective against COVID-19. Your health care provider will talk with you about ways to treat your symptoms. For most people, the infection is mild and can be managed at home with rest, fluids, and over-the-counter medicines. Treatment for a serious infection usually takes places in a hospital intensive care unit (ICU). It may include one or more of the following treatments. These treatments are given until your symptoms improve.  Receiving fluids and medicines through an IV.  Supplemental oxygen. Extra oxygen is given through a tube in the nose, a face mask, or a hood.  Positioning you to lie on your stomach (prone position). This makes it easier for oxygen to get into the lungs.  Continuous positive airway pressure (CPAP) or bi-level positive airway pressure (BPAP) machine. This treatment uses mild air pressure to keep the airways open. A tube that is connected to a motor delivers oxygen to the body.  Ventilator. This treatment moves air into and out of the lungs by using a tube that is placed in your windpipe.  Tracheostomy. This is a procedure to create a hole in the neck so that a breathing tube can be inserted.  Extracorporeal membrane oxygenation (ECMO). This procedure gives the lungs a chance to recover by taking over the functions of the heart and lungs. It supplies oxygen to the body and removes carbon dioxide. Follow these instructions at home: Lifestyle  If you are sick, stay home except to get medical care. Your health care provider will tell you how long to stay home. Call your health care provider before you go for medical care.  Rest at home as told by your health care provider.  Do not use any products that contain nicotine or tobacco, such as cigarettes, e-cigarettes, and  chewing tobacco. If you need help quitting, ask your health care provider.  Return to your normal activities as told by your health care provider. Ask your health care provider what activities are safe for you. General instructions  Take over-the-counter and prescription medicines only as told by your health care provider.  Drink enough fluid to keep your urine pale yellow.  Keep all follow-up visits as told by your health care provider. This is important. How is this prevented?  There is no vaccine to help prevent COVID-19 infection. However, there are steps you can take to protect yourself and others from this virus. To protect yourself:   Do not travel to areas where COVID-19 is a risk. The areas where COVID-19 is reported change often. To identify high-risk areas and travel restrictions, check the CDC travel website: wwwnc.cdc.gov/travel/notices  If you live in, or must travel to, an area where COVID-19 is a risk, take precautions to avoid infection. ? Stay away from people who are sick. ? Wash your hands often with soap and water for 20 seconds. If soap and water   are not available, use an alcohol-based hand sanitizer. ? Avoid touching your mouth, face, eyes, or nose. ? Avoid going out in public, follow guidance from your state and local health authorities. ? If you must go out in public, wear a cloth face covering or face mask. Make sure your mask covers your nose and mouth. ? Avoid crowded indoor spaces. Stay at least 6 feet (2 meters) away from others. ? Disinfect objects and surfaces that are frequently touched every day. This may include:  Counters and tables.  Doorknobs and light switches.  Sinks and faucets.  Electronics, such as phones, remote controls, keyboards, computers, and tablets. To protect others: If you have symptoms of COVID-19, take steps to prevent the virus from spreading to others.  If you think you have a COVID-19 infection, contact your health care  provider right away. Tell your health care team that you think you may have a COVID-19 infection.  Stay home. Leave your house only to seek medical care. Do not use public transport.  Do not travel while you are sick.  Wash your hands often with soap and water for 20 seconds. If soap and water are not available, use alcohol-based hand sanitizer.  Stay away from other members of your household. Let healthy household members care for children and pets, if possible. If you have to care for children or pets, wash your hands often and wear a mask. If possible, stay in your own room, separate from others. Use a different bathroom.  Make sure that all people in your household wash their hands well and often.  Cough or sneeze into a tissue or your sleeve or elbow. Do not cough or sneeze into your hand or into the air.  Wear a cloth face covering or face mask. Make sure your mask covers your nose and mouth. Where to find more information  Centers for Disease Control and Prevention: www.cdc.gov/coronavirus/2019-ncov/index.html  World Health Organization: www.who.int/health-topics/coronavirus Contact a health care provider if:  You live in or have traveled to an area where COVID-19 is a risk and you have symptoms of the infection.  You have had contact with someone who has COVID-19 and you have symptoms of the infection. Get help right away if:  You have trouble breathing.  You have pain or pressure in your chest.  You have confusion.  You have bluish lips and fingernails.  You have difficulty waking from sleep.  You have symptoms that get worse. These symptoms may represent a serious problem that is an emergency. Do not wait to see if the symptoms will go away. Get medical help right away. Call your local emergency services (911 in the [place] Do not drive yourself to the hospital. Let the emergency medical personnel know if you think you have COVID-19. Summary  COVID-19 is a  respiratory infection that is caused by a virus. It is also known as coronavirus disease or novel coronavirus. It can cause serious infections, such as pneumonia, acute respiratory distress syndrome, acute respiratory failure, or sepsis.  The virus that causes COVID-19 is contagious. This means that it can spread from person to person through droplets from breathing, speaking, singing, coughing, or sneezing.  You are more likely to develop a serious illness if you are 50 years of age or older, have a weak immune system, live in a nursing home, or have chronic disease.  There is no medicine to treat COVID-19. Your health care provider will talk with you about ways to treat your symptoms.    Take steps to protect yourself and others from infection. Wash your hands often and disinfect objects and surfaces that are frequently touched every day. Stay away from people who are sick and wear a mask if you are sick. This information is not intended to replace advice given to you by your health care provider. Make sure you discuss any questions you have with your health care provider. Document Revised: 01/03/2019 Document Reviewed: 04/11/2018 Elsevier Patient Education  2020 [employer]  

## 2019-12-01 ENCOUNTER — Telehealth: Payer: Self-pay | Admitting: Hematology & Oncology

## 2019-12-01 ENCOUNTER — Encounter: Payer: Self-pay | Admitting: *Deleted

## 2019-12-01 NOTE — Progress Notes (Signed)
Fax received from [facility] to inform Dr. Marin Olp that patient passed away on 12/18/2019.  Dr. Marin Olp notified.

## 2019-12-01 NOTE — Telephone Encounter (Signed)
Received call from [facility] that patient was deceased as of 12/23/2019.  Chart was flagged with this information as well.

## 2019-12-04 ENCOUNTER — Telehealth: Payer: Self-pay

## 2019-12-04 NOTE — Telephone Encounter (Signed)
[place] to find out what patients time of death was for death certificate. Was informed she passed away at home on 23-Dec-2019 at 1:54AM

## 2019-12-19 DEATH — deceased

## 2022-04-01 IMAGING — CT CT ABD-PELV W/ CM
2 of 5 series · 15 of 46 positions shown, 17 images · IV contrast (omnipaque)
Comparison: 03/26/2019

CLINICAL DATA: Hematuria.  History of renal cell carcinoma

EXAM:
CT ABDOMEN AND PELVIS WITH CONTRAST
TECHNIQUE: Multidetector CT imaging of the abdomen and pelvis was performed
using the standard protocol following bolus administration of
intravenous contrast.
CONTRAST:  80mL OMNIPAQUE IOHEXOL 300 MG/ML  SOLN

[Series 2: axial st · axial · 0.68mm/px · z∈[+1084,+1444]mm · 12 of 86 slices shown, 14 images]
[im 7/86  soft-tissue]
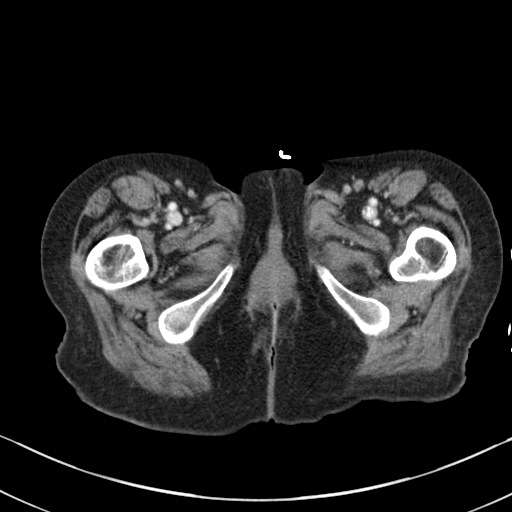
[im 7/86  bone]
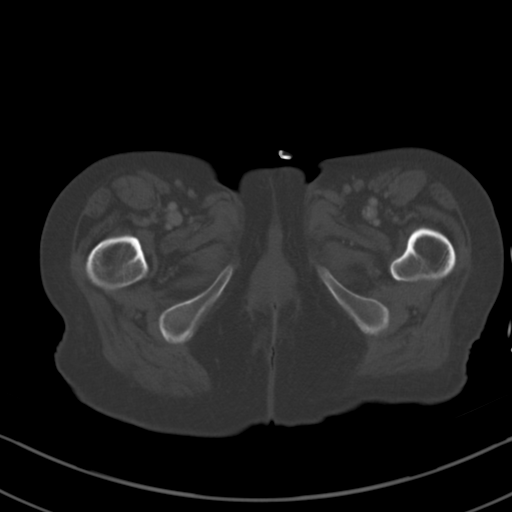
[im 13/86  soft-tissue]
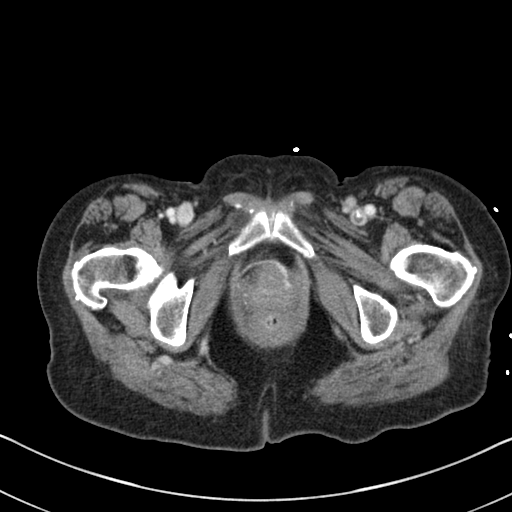
[im 19/86  soft-tissue]
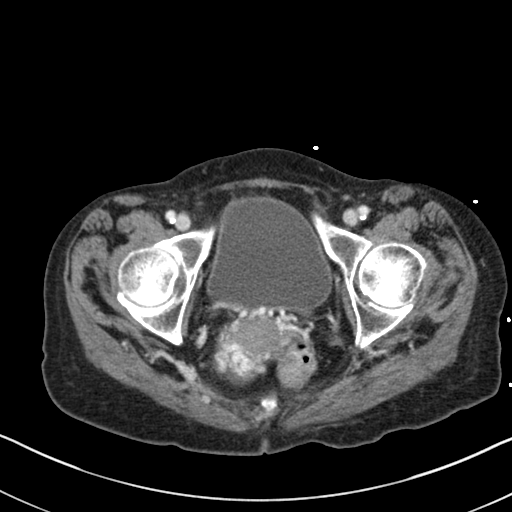
[im 25/86  soft-tissue]
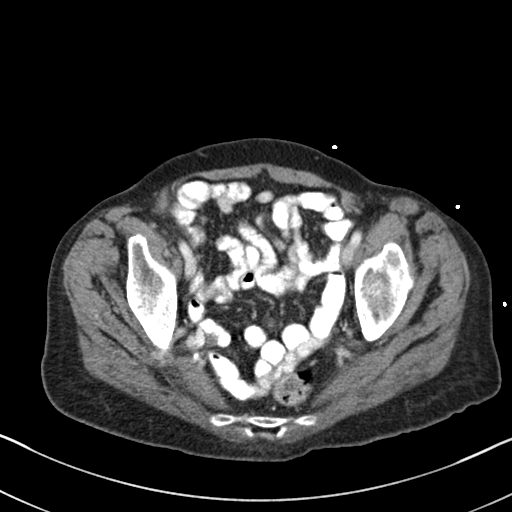
[im 31/86  soft-tissue]
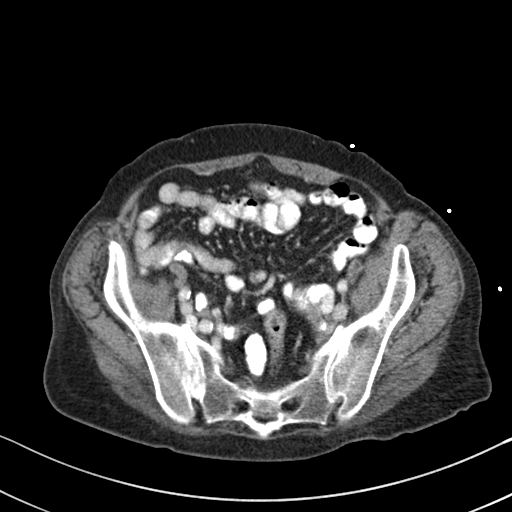
[im 37/86  soft-tissue]
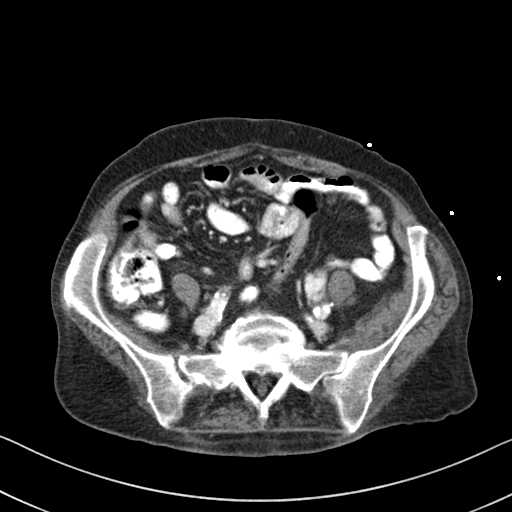
[im 49/86  soft-tissue]
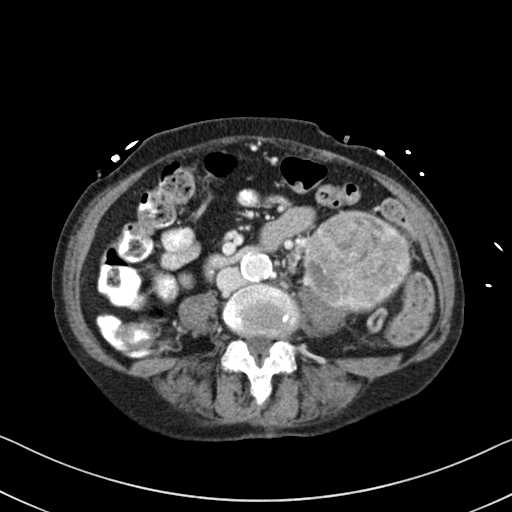
[im 55/86  soft-tissue]
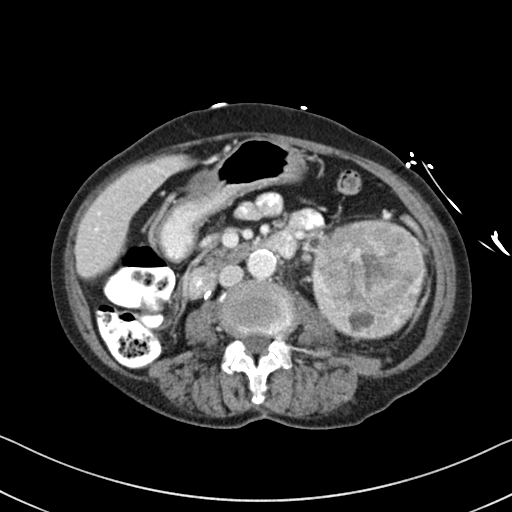
[im 61/86  soft-tissue]
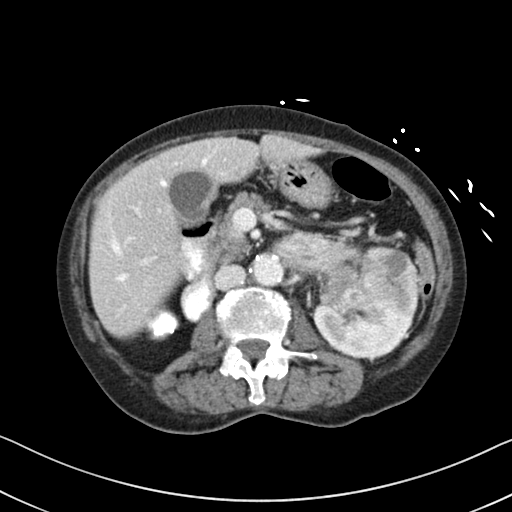
[im 61/86  bone]
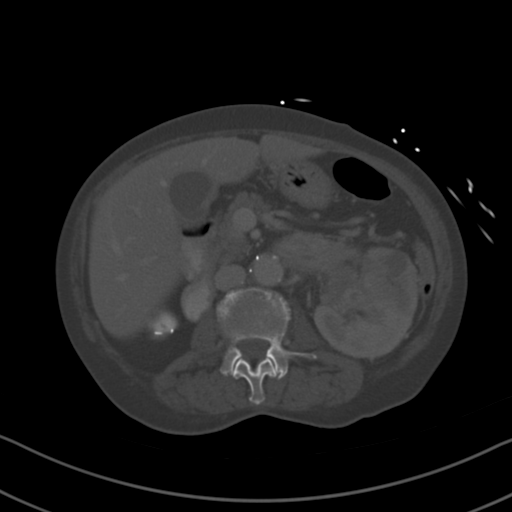
[im 67/86  soft-tissue]
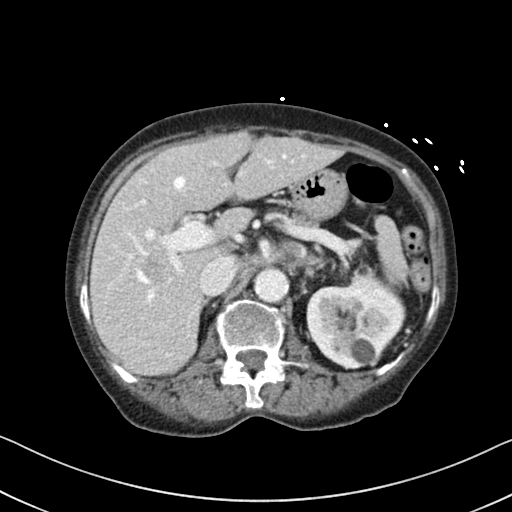
[im 73/86  soft-tissue]
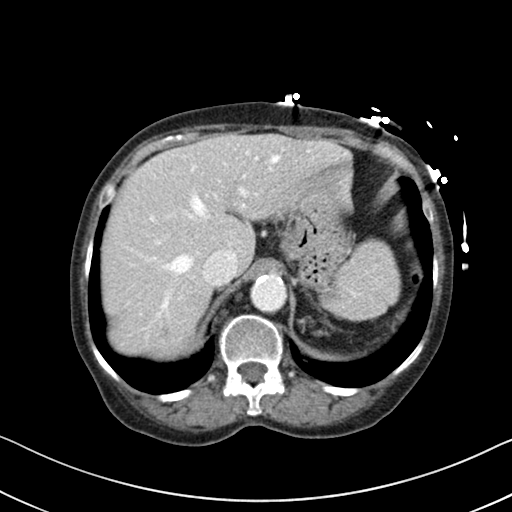
[im 79/86  soft-tissue]
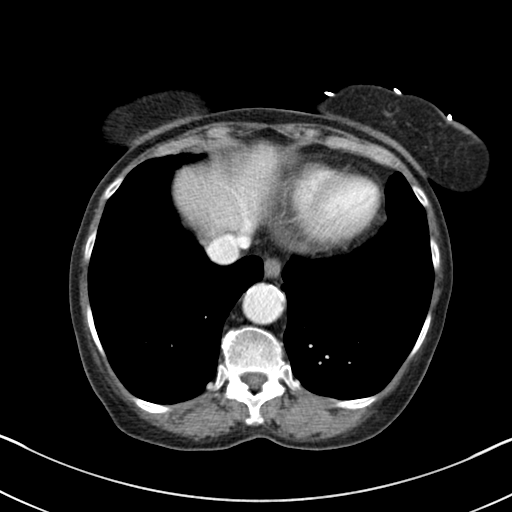

[Series 5: coronal st · coronal · 0.59mm/px · 3 of 127 slices shown]
[im 43/127  soft-tissue]
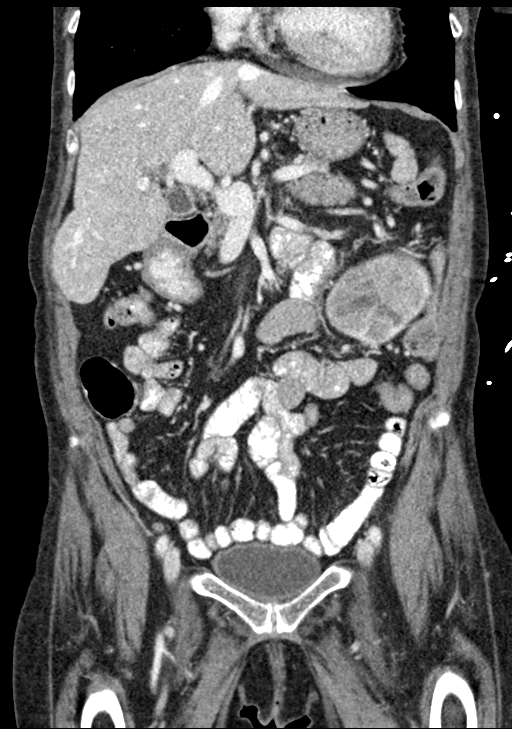
[im 57/127  soft-tissue]
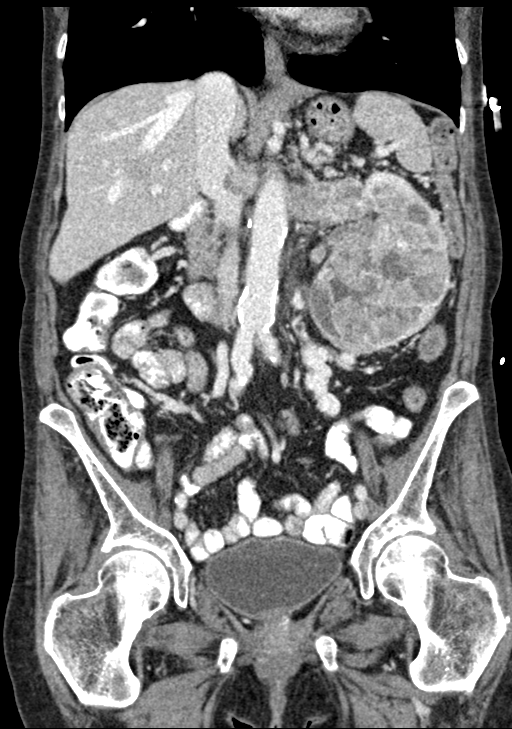
[im 71/127  soft-tissue]
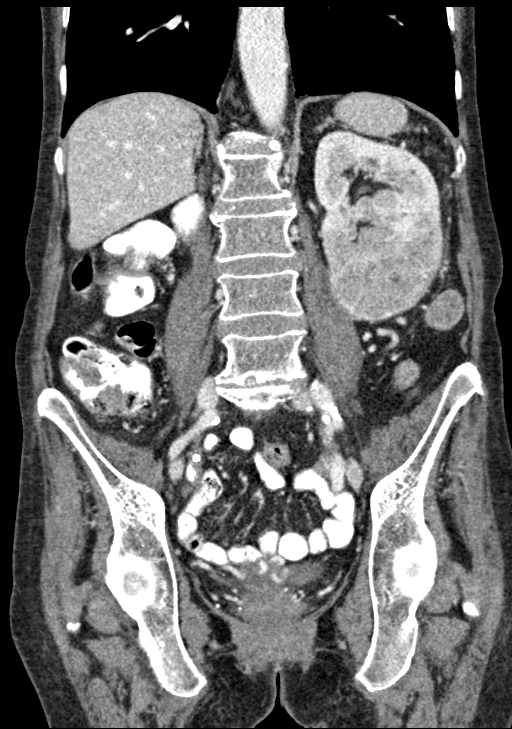

[15 of 46 positions shown; findings below may reference images not displayed]

FINDINGS: Lower chest: Nodules in the left lower lobe have enlarged since
prior study, measuring up to 2.1 cm on the 1st image. This measured
approximately 1.4 cm previously. No effusions.

Hepatobiliary: No focal hepatic abnormality. Small layering stones
within the gallbladder. No biliary ductal dilatation.

Pancreas: No focal abnormality or ductal dilatation.

Spleen: No focal abnormality.  Normal size.

Adrenals/Urinary Tract: Large mass involving the mid and lower pole
of the left kidney measures 7.8 x 7.3 cm and extends over a
craniocaudal length of approximately 10.3 cm. This is enlarged from
previous study when this measured 5.9 x 6.2 x 4.7 cm. Left renal
vein invasion with tumor thrombus extending through the left renal
vein and extending just into the IVC. This has progressed since
prior study. Prior right nephrectomy. Adrenal glands and urinary
bladder unremarkable.

Stomach/Bowel: Stomach, large and small bowel grossly unremarkable.

Vascular/Lymphatic: Aortic atherosclerosis. No enlarged abdominal or
pelvic lymph nodes. Stable partially occlusive DVT in the left
common femoral vein.

Reproductive: No mass.

Other: No free fluid or free air.

Musculoskeletal: No acute bony abnormality.
IMPRESSION: Enlarging left renal mass compatible renal cell carcinoma. Enlarging
renal vein tumor invasion/thrombus which now extends just into the
IVC.

Enlarging clustered peribronchovascular nodules in the left lower
lobe.

Aortic atherosclerosis. Stable partially occlusive left common
femoral vein DVT.

Layering gallstones within the gallbladder.
